# Patient Record
Sex: Male | Born: 1945
Health system: Southern US, Community
[De-identification: ages and names within clinical notes are randomized; demographics above are authoritative.]

## PROBLEM LIST (undated history)

## (undated) DIAGNOSIS — K219 Gastro-esophageal reflux disease without esophagitis: Secondary | ICD-10-CM

## (undated) DIAGNOSIS — Z86010 Personal history of colonic polyps: Secondary | ICD-10-CM

## (undated) DIAGNOSIS — C801 Malignant (primary) neoplasm, unspecified: Secondary | ICD-10-CM

## (undated) DIAGNOSIS — R600 Localized edema: Secondary | ICD-10-CM

## (undated) DIAGNOSIS — E291 Testicular hypofunction: Secondary | ICD-10-CM

## (undated) DIAGNOSIS — B958 Unspecified staphylococcus as the cause of diseases classified elsewhere: Secondary | ICD-10-CM

## (undated) DIAGNOSIS — G4730 Sleep apnea, unspecified: Secondary | ICD-10-CM

## (undated) DIAGNOSIS — E039 Hypothyroidism, unspecified: Secondary | ICD-10-CM

## (undated) DIAGNOSIS — T4145XA Adverse effect of unspecified anesthetic, initial encounter: Secondary | ICD-10-CM

## (undated) DIAGNOSIS — I10 Essential (primary) hypertension: Secondary | ICD-10-CM

## (undated) DIAGNOSIS — G20 Parkinson's disease: Secondary | ICD-10-CM

## (undated) DIAGNOSIS — M1990 Unspecified osteoarthritis, unspecified site: Secondary | ICD-10-CM

## (undated) DIAGNOSIS — M5030 Other cervical disc degeneration, unspecified cervical region: Secondary | ICD-10-CM

## (undated) DIAGNOSIS — E079 Disorder of thyroid, unspecified: Secondary | ICD-10-CM

## (undated) DIAGNOSIS — E785 Hyperlipidemia, unspecified: Secondary | ICD-10-CM

## (undated) DIAGNOSIS — H269 Unspecified cataract: Secondary | ICD-10-CM

## (undated) DIAGNOSIS — K5900 Constipation, unspecified: Secondary | ICD-10-CM

## (undated) DIAGNOSIS — G4733 Obstructive sleep apnea (adult) (pediatric): Secondary | ICD-10-CM

## (undated) DIAGNOSIS — Z9181 History of falling: Secondary | ICD-10-CM

## (undated) DIAGNOSIS — R7303 Prediabetes: Secondary | ICD-10-CM

## (undated) DIAGNOSIS — T7840XA Allergy, unspecified, initial encounter: Secondary | ICD-10-CM

## (undated) DIAGNOSIS — R1310 Dysphagia, unspecified: Secondary | ICD-10-CM

## (undated) DIAGNOSIS — T8859XA Other complications of anesthesia, initial encounter: Secondary | ICD-10-CM

## (undated) HISTORY — PX: TONSILLECTOMY: SUR1361

## (undated) HISTORY — PX: NASAL SEPTUM SURGERY: SHX37

## (undated) HISTORY — DX: Unspecified cataract: H26.9

## (undated) HISTORY — DX: Localized edema: R60.0

## (undated) HISTORY — DX: Allergy, unspecified, initial encounter: T78.40XA

## (undated) HISTORY — DX: Testicular hypofunction: E29.1

## (undated) HISTORY — DX: Parkinson's disease: G20

## (undated) HISTORY — DX: History of falling: Z91.81

## (undated) HISTORY — DX: Essential (primary) hypertension: I10

## (undated) HISTORY — DX: Disorder of thyroid, unspecified: E07.9

## (undated) HISTORY — PX: UVULOPALATOPHARYNGOPLASTY: SHX827

## (undated) HISTORY — DX: Hyperlipidemia, unspecified: E78.5

## (undated) HISTORY — DX: Prediabetes: R73.03

## (undated) HISTORY — DX: Obstructive sleep apnea (adult) (pediatric): G47.33

## (undated) HISTORY — PX: CATARACT EXTRACTION: SUR2

## (undated) HISTORY — DX: Unspecified staphylococcus as the cause of diseases classified elsewhere: B95.8

## (undated) HISTORY — DX: Sleep apnea, unspecified: G47.30

## (undated) HISTORY — DX: Personal history of colonic polyps: Z86.010

---

## 2004-04-27 HISTORY — PX: COLONOSCOPY: SHX174

## 2005-01-25 ENCOUNTER — Ambulatory Visit: Payer: Self-pay | Admitting: Internal Medicine

## 2005-05-23 ENCOUNTER — Ambulatory Visit: Payer: Self-pay | Admitting: Internal Medicine

## 2005-10-19 ENCOUNTER — Ambulatory Visit: Payer: Self-pay | Admitting: Internal Medicine

## 2005-10-22 ENCOUNTER — Ambulatory Visit: Payer: Self-pay | Admitting: Internal Medicine

## 2006-04-15 ENCOUNTER — Ambulatory Visit: Payer: Self-pay | Admitting: Internal Medicine

## 2006-06-05 ENCOUNTER — Ambulatory Visit: Payer: Self-pay | Admitting: Internal Medicine

## 2006-07-09 ENCOUNTER — Ambulatory Visit (HOSPITAL_BASED_OUTPATIENT_CLINIC_OR_DEPARTMENT_OTHER): Admission: RE | Admit: 2006-07-09 | Discharge: 2006-07-09 | Payer: Self-pay | Admitting: Internal Medicine

## 2006-07-14 ENCOUNTER — Ambulatory Visit: Payer: Self-pay | Admitting: Internal Medicine

## 2006-08-07 ENCOUNTER — Ambulatory Visit: Payer: Self-pay | Admitting: Internal Medicine

## 2006-09-17 ENCOUNTER — Ambulatory Visit: Payer: Self-pay | Admitting: Internal Medicine

## 2007-01-08 ENCOUNTER — Ambulatory Visit: Payer: Self-pay | Admitting: Internal Medicine

## 2007-06-11 ENCOUNTER — Ambulatory Visit: Payer: Self-pay | Admitting: Internal Medicine

## 2008-06-01 DIAGNOSIS — J302 Other seasonal allergic rhinitis: Secondary | ICD-10-CM | POA: Insufficient documentation

## 2008-06-01 DIAGNOSIS — I10 Essential (primary) hypertension: Secondary | ICD-10-CM

## 2008-06-01 DIAGNOSIS — H409 Unspecified glaucoma: Secondary | ICD-10-CM | POA: Insufficient documentation

## 2008-06-01 DIAGNOSIS — J3089 Other allergic rhinitis: Secondary | ICD-10-CM

## 2008-06-02 ENCOUNTER — Ambulatory Visit: Payer: Self-pay | Admitting: Internal Medicine

## 2008-06-06 DIAGNOSIS — G4700 Insomnia, unspecified: Secondary | ICD-10-CM

## 2008-12-02 ENCOUNTER — Ambulatory Visit (HOSPITAL_COMMUNITY): Admission: RE | Admit: 2008-12-02 | Discharge: 2008-12-02 | Payer: Self-pay | Admitting: Internal Medicine

## 2009-06-01 ENCOUNTER — Ambulatory Visit: Payer: Self-pay | Admitting: Internal Medicine

## 2010-05-31 ENCOUNTER — Ambulatory Visit: Payer: Self-pay | Admitting: Internal Medicine

## 2010-11-09 NOTE — Assessment & Plan Note (Signed)
Summary: 1 year/ mbw   Primary Provider/Referring Provider:  Oneta Rack  CC:  Yearly follow up visit-allergies; no complaints..  History of Present Illness: 06/02/08- 65 year old man returning for follow-up of rhinitis.  We had stopped allergy vaccine in September as planned.  He is controlling symptoms with fluticasone nasal spray and one half of an Allegra D12 hour tablet each morning. Getting spinal injections for degenerative arthritis of the spine. Complaints of daytime sleepiness, especially in the mornings.  A sleep study in October of 2007 was negative, index 0.6/hr, but limited.  He slept only two or 3 hours.  Sleep habits were reviewed.  No problem with sleep onset, but has difficulty maintaining sleep.  Lunesta half-life  was too long.  06/01/09- allergic rhiniitis Sleep problems resolved with no med needed. Rhinitis controlled well, taking fluticasone nasal spray each night. We discussed side effects of the decongestants, distinquished from antihistamines. He feels he is doing very well with current meds and feels no reason to make any changes. Discussed flu vaccine for this year.  May 31, 2010- Allergic rhinitis Managed over past year with otc fexofenadine-D. Had some BP problems with the decongestant. We talked in detail for 15 minutes about decongestants vs antihistimines. Some drainage. Ltitle cough and no wheeze or chest tightness. Currently using daughter's liquid claritin. Had MRI evaluating memory per neurology, put on aspirin.     Preventive Screening-Counseling & Management  Alcohol-Tobacco     Smoking Status: never  Current Medications (verified): 1)  Lumigan 0.01 % Soln (Bimatoprost) .Marland Kitchen.. 1 Drop in Each Eye 2)  Fluticasone Propionate 50 Mcg/act Susp (Fluticasone Propionate) .... Use 1-2 Sprays Daily 3)  Multivitamins  Tabs (Multiple Vitamin) .... Take 1 Tablet By Mouth Once A Day 4)  Bystolic 10 Mg Tabs (Nebivolol Hcl) .... Take 1 Tab By Mouth At Bedtime 5)   Pravastatin Sodium 20 Mg Tabs (Pravastatin Sodium) .... Take 1 Tablet On M,w,f 6)  Vitamin D (Ergocalciferol) 50000 Unit Caps (Ergocalciferol) .... Take One Capsule By Mouth Two Times Per Week 7)  Claritin 5 Mg Chew (Loratadine) .... Take 1 By Mouth At Bedtime 8)  Aspirin 81 Mg Tbec (Aspirin) .... Take 1 By Mouth Once Daily 9)  Veramyst 27.5 Mcg/spray Susp (Fluticasone Furoate) .Marland Kitchen.. 1 Spray At Bedtime 10)  Testosterone Injection .... 2cc Every 3 Weeks  Allergies (verified): No Known Drug Allergies  Past History:  Past Medical History: Last updated: 06/02/2008 HYPERTENSION (ICD-401.9) GLAUCOMA (ICD-365.9) ? OBSTRUCTIVE SLEEP APNEA (ICD-327.23) RULED OUT by NEG NPSG 10/07, limited study ALLERGIC RHINITIS (ICD-477.9)- stopped allergy vaccine 9/08 Degenerative arthritis spine  Past Surgical History: Last updated: 06/01/2009 Uvulopalatopharyngoplasty/ UPPP x 2 Lumbar disk Cataracts  Family History: Last updated: 06/02/2008 mother-emphysema father-liver [place]  Social History: Last updated: 06/02/2008 Patient never smoked.  Pt is married with 2 children. Pt is currently employeed as a Therapist, music.   Risk Factors: Smoking Status: never (05/31/2010)  Review of Systems      See HPI       The patient complains of nasal congestion/difficulty breathing through nose and sneezing.  The patient denies shortness of breath with activity, shortness of breath at rest, productive cough, non-productive cough, coughing up blood, chest pain, irregular heartbeats, acid heartburn, indigestion, loss of appetite, weight change, abdominal pain, difficulty swallowing, sore throat, tooth/dental problems, and headaches.    Vital Signs:  Patient profile:   65 year old male Height:      70 inches Weight:      203.25 pounds  BMI:     29.27 O2 Sat:      98 % on Room air Pulse rate:   61 / minute BP sitting:   134 / 78  (right arm) Cuff size:   regular  Vitals Entered By: Reynaldo Minium  CMA (May 31, 2010 9:06 AM)  O2 Flow:  Room air CC: Yearly follow up visit-allergies; no complaints.   Physical Exam  Additional Exam:  General: A/Ox3; pleasant and cooperative, NAD, SKIN: no rash, lesions NODES: no lymphadenopathy HEENT: [place]/AT, EOM- WNL, Conjuctivae- clear, PERRLA, TM-WNL, Nose- clear, Throat- clear and wnl, Mallampati  II s/p UPPP NECK: Supple w/ fair ROM, JVD- none, normal carotid impulses w/o bruits Thyroid- normal to palpation, no stridor CHEST: Clear to P&A, ? a little raspy? HEART: RRR, no m/g/r heard ABDOMEN: Soft and nl;  EAV:WUJW, nl pulses, no edema  NEURO: Grossly intact to observation, speech seems brisk, almost pressured.      Impression & Recommendations:  Problem # 1:  ALLERGIC RHINITIS (ICD-477.9)  Fair control. He expects more problems later in the Fall. We are going to have him try allegra 180, supplementing with an otc decongestant at times as needed. We will refill fluticasone. i think he shuld go lightly on the stimulant/ decongestants. The following medications were removed from the medication list:    Veramyst 27.5 Mcg/spray Susp (Fluticasone furoate) .Marland Kitchen... 1 spray at bedtime His updated medication list for this problem includes:    Claritin 5 Mg Chew (Loratadine) .Marland Kitchen... Take 1 by mouth at bedtime    Fluticasone Propionate 50 Mcg/act Susp (Fluticasone propionate) ..... Use 1-2 sprays daily    Fexofenadine Hcl 180 Mg Tabs (Fexofenadine hcl) .Marland Kitchen... 1 daily as needed antihistamine    Sudafed Pe Maximum Strength 10 Mg Tabs (Phenylephrine hcl) .Marland Kitchen... 1 three times a day as needed  Problem # 2:  INSOMNIA (ICD-780.52) Stimulant meds were discussed as aggravating insomnia.  Medications Added to Medication List This Visit: 1)  Lumigan 0.01 % Soln (Bimatoprost) .Marland Kitchen.. 1 drop in each eye 2)  Claritin 5 Mg Chew (Loratadine) .... Take 1 by mouth at bedtime 3)  Aspirin 81 Mg Tbec (Aspirin) .... Take 1 by mouth once daily 4)  Testosterone Injection   .... 2cc every 3 weeks 5)  Veramyst 27.5 Mcg/spray Susp (Fluticasone furoate) .Marland Kitchen.. 1 spray at bedtime 6)  Fexofenadine Hcl 180 Mg Tabs (Fexofenadine hcl) .Marland Kitchen.. 1 daily as needed antihistamine 7)  Sudafed Pe Maximum Strength 10 Mg Tabs (Phenylephrine hcl) .Marland Kitchen.. 1 three times a day as needed  Other Orders: Est. Patient Level IV (11914)  Patient Instructions: 1)  Please schedule a follow-up appointment in 1 year. 2)  Suggest you try otc fexofenadine/ Allegra 180/ 24 hour strength, once daily as an antihistamine. Compare with claritin. 3)  Try adding an otc decongestant only when you are stuffy  4)       Sudafed-PE 5)  OK to continue nasal steroid fluticasone Prescriptions: FLUTICASONE PROPIONATE 50 MCG/ACT SUSP (FLUTICASONE PROPIONATE) use 1-2 sprays daily  #3 x 3   Entered and Authorized by:   Waymon Budge MD   Signed by:   Waymon Budge MD on 05/31/2010   Method used:   Print then Give to Patient   RxID:   [id number]

## 2011-02-20 NOTE — Assessment & Plan Note (Signed)
[name]                             PULMONARY OFFICE NOTE   NAME:MILLSRanier, [name]                        MRN:          [id number]  DATE:06/11/2007                            DOB:          Aug 29, 1946    PROBLEM:  1. Allergic rhinitis.  2. Obstructive sleep apnea/uvulopalatopharyngoplasty.  3. Glaucoma.  4. Hypertension.   HISTORY:  One year followup.  He had had a nocturnal polysomnogram in  October of 2007 showing an index of only 0.6 per hour but his wife is in  a separate room still because of his loud snoring.  Dr. Haroldine Laws has  done a septoplasty and palatoplasty.  He still wakes briefly during the  night with the sense that his sleep quality is shallow, he does not  recognize any benefit from [place] taking 1/2 of an Allegra-D tablet in  the mornings.  He has been on allergy vaccine at 1:10 since skin testing  in 2003 and is ready to try being off.   MEDICATIONS:  1. Xalatan drops.  2. Fluticasone nasal spray.  3. Allegra D 12 hour pill.  4. Allergy vaccine.  5. Blood pressure pill he can not name.  6. Occasional Viagra.  7. He has an Epi-Pen.   NO MEDICATION ALLERGY.   OBJECTIVE:  Weight 206 pounds, blood pressure 138/76, pulse 78, room air  saturation 98%.  Alert, medium build.  Clear speech, nasal airway is  quite clear.  Palate is status post palatoplasty still with rather long  soft palate posteriorly placed.  There is no visible drainage.  LUNGS:  Fields are clear.  HEART:  Sounds normal.  EXTREMITIES:  Without tremor or restlessness.   IMPRESSION:  1. Excessive daytime somnolence and snoring with some sense that sleep      quality is non-restorative, but nocturnal polysomnogram in October      of 2007 recorded index only 0.6 per hour.  2. Allergic rhinitis.   PLAN:  1. Finish and quit allergy vaccine then watch symptoms.  2. May take 1/2 of an Allegra-D in the mornings if that helps him.  3. Retry Ambien CR 6.25 mg one  or two at bedtime p.r.n. and compare      that with samples      Lunesta 2 mg bedtime p.r.n.  4. Schedule return 1 year, earlier p.r.n.     Mason D. Maple Hudson, MD, [name], FACP  Electronically Signed    CDY/MedQ  DD: 06/11/2007  DT: 06/12/2007  Job #: [id number]   cc:   [name], M.D.

## 2011-02-23 NOTE — Assessment & Plan Note (Signed)
[facility]                               PULMONARY OFFICE NOTE   NAME:[name], [name]                        MRN:          [id number]  DATE:06/05/2006                            DOB:          07/06/1946    PROBLEM:  1. Allergic rhinitis.  2. Obstructive sleep apnea.   HISTORY:  He came today reporting that his allergy control has been good,  but he wants to talk about his snoring and sleep apnea.  As the ragweed and  pollen levels have risen, he is using Allegra-D 24 hour on a daily basis and  generic Flonase b.i.d.  He continues allergy vaccine at 1:10 given by his  wife with no problems.  We took time again today to discuss risks, benefits,  and choices related to allergy vaccine, including policy concerning  administration outside of a medical office, anaphylaxis, and epinephrine.  He has now been on allergy vaccine for four years and we are going to  reassess at the end of five.  His wife is sleeping upstairs to avoid his  snoring.  He is aware that he tosses and turns in his sleep and wakes  unrested.  In the past years, he had a palatoplasty and septoplasty by Dr.  Haroldine Laws to treat sleep apnea.  His original study is probably 64 years old  and we do not have a copy of the old report any longer.   MEDICATIONS:  1. Xalatan eye drops.  2. Fluticasone nasal spray.  3. Allegra-D 24.  4. Allergy injections.  5. Viagra.  6. Epi pen.   ALLERGIES:  No known drug allergies.   OBJECTIVE:  VITAL SIGNS:  Weight 203 pounds, BP 142/84, pulse regular at 74,  room air saturation 97%.  HEENT:  There is moderate turbinate edema and some clear white mucus  crusting without visible polyps or obstruction.  Conjunctivae are clear with  no injection or discharge.  His pharynx is not reddened.  He is status post  uvuloplasty.  Voice quality is normal.  NECK:  There is no stridor or neck vein distention.  LUNGS:  Clear to P&A.  HEART:  Heart sounds are  regular without murmur or gallop.  No edema.   IMPRESSION:  1. Allergic rhinitis under fair control, but requiring daily medication in      addition to allergy vaccine, at this, which he considers his worst time      of year.  2. Obstructive sleep apnea, no longer controlled by previous surgery.   PLAN:  1. Risk/benefits discussion and waiver signed.  2. We are scheduling a split protocol nocturnal polysomnogram at the [facility], and he will return after that is complete for followup.                                   Clinton D. Maple Hudson, MD, FCCP, FACP   CDY/MedQ  DD:  06/05/2006  DT:  06/05/2006  Job #:  [id number]  cc:   Lucky Cowboy, MD

## 2011-02-23 NOTE — Procedures (Signed)
NAME:  [name], [name]               ACCOUNT NO.:  [id number]   MEDICAL RECORD NO.:  [id number]          PATIENT TYPE:  OUT   LOCATION:  SLEEP CENTER                 FACILITY:  [facility]   PHYSICIAN:  Clinton D. Maple Hudson, MD, FCCP, FACPDATE OF BIRTH:  04/08/1947   DATE OF STUDY:  07/09/2006                              NOCTURNAL POLYSOMNOGRAM   REFERRING PHYSICIAN:  Dr. Jetty Duhamel   INDICATION FOR STUDY:  Hypersomnia with sleep apnea.  Previous history of  obstructive sleep apnea.   EPWORTH'S SLEEPINESS SCORE:  9/24.   BMI:  30.3.   WEIGHT:  205 pounds.   HOME MEDICATION:  Allegra-D 24-hour.   SLEEP ARCHITECTURE:  Total sleep time, short, 208 minutes with sleep  efficiency 57%.  Stage I was 6%.  Stage II 88%.  Stages III and IV were  absent.  REM 6% of total sleep time.  Sleep latency 29 minutes.  REM latency  112 minutes.  Awake after sleep onset 113 minutes.  Arousal index 11.  No  bedtime medication was taken.  The patient was described as getting very  restless in the latter part of the study and he was awake after 2 a.m.  The  study was ended at 4:02 a.m.   RESPIRATORY DATA:  Apnea hypopnea index (AHI, RDI), is 0.6 obstructing  events per hour, which is within normal limits.  This reflected 1  obstructive apnea and 1 hypopnea.  These events were not positional.  REM  AHI 0.   OXYGEN DATA:  Moderate snoring with oxygen desaturation to a nadir of 90%.  Mean oxygen saturation through the study was 95% on room air.   CARDIAC DATA:  Normal sinus rhythm.   MOVEMENTS/PARASOMNIA:  A total of 289 limb jerks were recorded of which 11  were associated with arousal or awakening for periodic limb movement with  arousal index of 3.2 per hour which is mildly increased.  No bathroom trips.   IMPRESSION/RECOMMENDATION:  1. Short total sleep time, awake after 2 a.m.  Note the possible stimulant      effect of Allegra-D 24.  2. Occasional sleep disordered breathing events, apnea hypopnea  index 0.6      per hour, which is within normal      limits and insignificant.  Moderate snoring with oxygen desaturation to      a nadir of 90%.  3. Periodic limb movement with arousal, 3.2 per hour.      Clinton D. Maple Hudson, MD, [facility], FACP  Diplomate, Biomedical engineer of Sleep Medicine  Electronically Signed     CDY/MEDQ  D:  07/13/2006 12:44:13  T:  07/15/2006 05:57:55  Job:  [id number]

## 2011-02-23 NOTE — Assessment & Plan Note (Signed)
[facility]                               PULMONARY OFFICE NOTE   NAME:MILLSJesson, Foskey                        MRN:          [id number]  DATE:08/07/2006                            DOB:          11/30/1945    PROBLEMS:  1. Allergic rhinitis.  2. Obstructive sleep apnea/palatoplasty.  3. Glaucoma.   HISTORY:  He had to wait again today, unfortunately, and I apologized, and  we discussed the office process.  He returns now after his sleep study done  October 2.  He was only able to fall to sleep a total of 208 minutes, and  was awake most of the time after 2 a.m.  He has been taking Allegra D,  sometimes Allegra D 24, and I discussed the stimulant effect and impact on  sleep of sustained-action decongestants.  He had only occasional sleep  disordered breathing events, with an index of 0.6 per hour, moderate snoring  and normal oxygenation.  He had a few leg jerks causing arousals about 3.2  times per hour.  He says he falls asleep well, but wakes in the middle of  the night, similar to his pattern at the sleep center, and cannot regain  sleep.  He drinks coffee and usually takes a 12-hour duration Allegra  product in the morning, with no caffeine after lunch.   MEDICATIONS:  1. Xalatan eye drops.  2. Fluticasone nasal spray.  3. Allegra D 24 or 12-hour.  4. Multivitamins.  5. Allergy vaccine continues at 1:10, given by his wife without problems.  6. Viagra.  7. EpiPen.   ALLERGIES:  No medication allergy.   OBJECTIVE:  Weight 215 pounds, BP 172/92, pulse regular 79, room air  saturation 97%.  Medium build, a little overweight.  Nasal airways not obstructed.  Speech quality is clear.  He does not appear  sleepy.  Breathing is unlabored, no restlessness or unusual movement.   IMPRESSION:  1. Allergic rhinitis.  2. Snoring without sleep apnea.  3. Chronic insomnia.  4. Hypertension, at least after waiting at this office, but again with  considerations related to use of long-term decongestant medications.   PLAN:  We discussed sleep hygiene and expectations, available medication  options.  1. Triazolam 0.25 mg for use on waking in the middle of the night.  He can      try taking it at bedtime as a first dose if necessary.  He can compare      this to Sonata 10 mg, in each case looking at these as short-acting      medications appropriate for use when necessary as long as he expects to      get about 4 hours more sleep.  2. Try Astelin nasal spray once or twice each nostril b.i.d. p.r.n.  It      may help him sleep at night.  3. Try sleeping off flat of back.  4. Schedule return in 1 year vaccine followup, earlier p.r.n.     Clinton D. Maple Hudson, MD, FCCP, FACP  Electronically Signed    CDY/MedQ  DD:  08/10/2006  DT: 08/11/2006  Job #: [id number]   cc:   Lucky Cowboy, M.D.

## 2011-05-29 ENCOUNTER — Encounter: Payer: Self-pay | Admitting: Internal Medicine

## 2011-05-30 ENCOUNTER — Ambulatory Visit (INDEPENDENT_AMBULATORY_CARE_PROVIDER_SITE_OTHER): Payer: BC Managed Care – PPO | Admitting: Internal Medicine

## 2011-05-30 ENCOUNTER — Encounter: Payer: Self-pay | Admitting: Internal Medicine

## 2011-05-30 VITALS — BP 112/86 | HR 64 | Ht 70.0 in | Wt 204.8 lb

## 2011-05-30 DIAGNOSIS — J309 Allergic rhinitis, unspecified: Secondary | ICD-10-CM

## 2011-05-30 NOTE — Assessment & Plan Note (Signed)
His glaucoma and HBP are pertinent to choices of meds.  A nonsedating antihistamine and the fluticasone are probably best choices for bothersome symptoms. We went over the antihistmaine issue again.

## 2011-05-30 NOTE — Patient Instructions (Signed)
Please call for refill on your fluticasone/ Flonase as needed  Consider for nonsedating antihistamine: loratadine/ Claritin or fexofenadine/ Allegra. These don't get into the brain, won't raise blood pressure and won't cause drowsiness for most people.

## 2011-05-30 NOTE — Progress Notes (Addendum)
Subjective:    Patient ID: [name], male    DOB: 12-Aug-1946, 65 y.o.   MRN: [id number]  HPI 05/30/11- 65 yoM followed for allergic rhinitis, complicated by insomnia, glaucoma, HBP. Last here May 31, 2010-  Expects most problems in the Fall Ragweed season. Pollen levels have not started to surge yet, but he reports some nasal congestion and eyes watering. Minimal chest tightness mainly with bending over.I discussed this as related more to abdominal girth than to lung function.  Med list review- he seems unaware of loratadine or allegra despite discussion last year, and avoids allergy and sinus meds. . He asks about Agent Orange, assuming he was exposed, but not knowing or making a specific attribution. I referred him to the [place] for that consideration. He indicates concern that he is slowing down and forgetful. Neurology evaluation by Dr Thad Ranger over a year ago was negative.   Review of Systems Constitutional:   No-   weight loss, night sweats, fevers, chills, fatigue, lassitude. HEENT:   No-  headaches, difficulty swallowing, tooth/dental problems, sore throat,       Mild-   sneezing, itching, ear ache, nasal congestion, post nasal drip,  CV:  No-   chest pain, orthopnea, PND, swelling in lower extremities, anasarca, dizziness, palpitations Resp: No-   shortness of breath with exertion or at rest.              No-   productive cough,  No non-productive cough,  No-  coughing up of blood.              No-   change in color of mucus.  No- wheezing.   Skin: No-   rash or lesions. GI:  No-   heartburn, indigestion, abdominal pain, nausea, vomiting, diarrhea,                 change in bowel habits, loss of appetite GU: No-   dysuria, change in color of urine, no urgency or frequency.  No- flank pain. MS:  No-   joint pain or swelling.  No- decreased range of motion.  No- back pain. Neuro- grossly normal to observation, Or:  Psych:  No- change in mood or affect. No depression or anxiety.  No  memory loss.      Objective:   Physical Exam General- Alert, Oriented, Affect-appropriate, Distress- none acute Skin- rash-none, lesions- none, excoriation- none Lymphadenopathy- none Head- atraumatic            Eyes- Gross vision intact, PERRLA, conjunctivae clear secretions            Ears- Hearing, canals normal            Nose- Clear, No- Septal dev, mucus, polyps, erosion, perforation             Throat- Mallampati II, mucosa clear , drainage- none, tonsils- atrophic Neck- flexible , trachea midline, no stridor , thyroid nl, carotid no bruit Chest - symmetrical excursion , unlabored           Heart/CV- RRR , no murmur , no gallop  , no rub, nl s1 s2                           - JVD- none , edema- none, stasis changes- none, varices- none           Lung- clear to P&A, wheeze- none, cough- none , dullness-none, rub- none  Chest wall-  Abd- tender-no, distended-no, bowel sounds-present, HSM- no Br/ Gen/ Rectal- Not done, not indicated Extrem- cyanosis- none, clubbing, none, atrophy- none, strength- nl Neuro- His facial expression seems flat. No tremor, but I wonder about early Parkinson's which might relate some of his complains. Discussed available Neurology since Dr Thad Ranger is now gone.         Assessment & Plan:

## 2011-09-11 ENCOUNTER — Other Ambulatory Visit (HOSPITAL_COMMUNITY): Payer: Self-pay | Admitting: Internal Medicine

## 2011-09-11 ENCOUNTER — Ambulatory Visit (HOSPITAL_COMMUNITY)
Admission: RE | Admit: 2011-09-11 | Discharge: 2011-09-11 | Disposition: A | Discharge status: Home / Self Care | Payer: BC Managed Care – PPO | Source: Ambulatory Visit | Attending: Internal Medicine | Admitting: Internal Medicine

## 2011-09-11 DIAGNOSIS — R0602 Shortness of breath: Secondary | ICD-10-CM | POA: Insufficient documentation

## 2011-09-11 DIAGNOSIS — R059 Cough, unspecified: Secondary | ICD-10-CM | POA: Insufficient documentation

## 2011-09-11 DIAGNOSIS — R0989 Other specified symptoms and signs involving the circulatory and respiratory systems: Secondary | ICD-10-CM | POA: Insufficient documentation

## 2011-09-11 DIAGNOSIS — R05 Cough: Secondary | ICD-10-CM

## 2011-11-21 ENCOUNTER — Ambulatory Visit (INDEPENDENT_AMBULATORY_CARE_PROVIDER_SITE_OTHER): Payer: BC Managed Care – PPO | Admitting: Physician Assistant

## 2011-11-21 DIAGNOSIS — R0602 Shortness of breath: Secondary | ICD-10-CM

## 2011-11-21 NOTE — Progress Notes (Signed)
   [name] is a 65 y.o. male with a h/o HTN, HLP who presents for ETT.  Referred by PCP.  Non-smoker.  He has a FHx of CAD (sister had CABG in 58s).  Recent h/o DOE.  No chest pain.  Exam unremarkable.  Exercise Treadmill Test  Pre-Exercise Testing Evaluation Rhythm: normal sinus  Rate: 73   PR:  .15 QRS:  .10  QT:  .36 QTc: .39     Test  Exercise Tolerance Test Ordering MD: Lucky Cowboy MD  Interpreting MD:  Tereso Newcomer PA-C  Unique Test No: 1  Treadmill:  1  Indication for ETT: exertional dyspnea  Contraindication to ETT: No   Stress Modality: exercise - treadmill  Cardiac Imaging Performed: non   Protocol: standard Bruce - maximal  Max BP:  185/60  Max MPHR (bpm):  156 85% MPR (bpm):  133  MPHR obtained (bpm):  141 % MPHR obtained:  90%  Reached 85% MPHR (min:sec):  7:28 Total Exercise Time (min-sec):  8:30  Workload in METS:  10.1 Borg Scale: 17  Reason ETT Terminated:  desired heart rate attained    ST Segment Analysis At Rest: normal ST segments - no evidence of significant ST depression With Exercise: non-specific ST changes  Other Information Arrhythmia:  No Angina during ETT:  absent (0) Quality of ETT:  diagnostic  ETT Interpretation:  normal - no evidence of ischemia by ST analysis  Comments: Good exercise tolerance. No chest pain. Normal BP response to exercise. No ST-T changes to suggest ischemia.   Recommendations: Follow up with PCP as directed. Tereso Newcomer, PA-C  9:41 AM 11/21/2011

## 2012-02-07 DIAGNOSIS — E782 Mixed hyperlipidemia: Secondary | ICD-10-CM | POA: Diagnosis not present

## 2012-02-07 DIAGNOSIS — M79609 Pain in unspecified limb: Secondary | ICD-10-CM | POA: Diagnosis not present

## 2012-02-07 DIAGNOSIS — R413 Other amnesia: Secondary | ICD-10-CM | POA: Diagnosis not present

## 2012-02-07 DIAGNOSIS — R4789 Other speech disturbances: Secondary | ICD-10-CM | POA: Diagnosis not present

## 2012-02-07 DIAGNOSIS — R7309 Other abnormal glucose: Secondary | ICD-10-CM | POA: Diagnosis not present

## 2012-02-07 DIAGNOSIS — Z79899 Other long term (current) drug therapy: Secondary | ICD-10-CM | POA: Diagnosis not present

## 2012-02-07 DIAGNOSIS — E559 Vitamin D deficiency, unspecified: Secondary | ICD-10-CM | POA: Diagnosis not present

## 2012-02-07 DIAGNOSIS — E538 Deficiency of other specified B group vitamins: Secondary | ICD-10-CM | POA: Diagnosis not present

## 2012-02-07 DIAGNOSIS — E291 Testicular hypofunction: Secondary | ICD-10-CM | POA: Diagnosis not present

## 2012-02-07 DIAGNOSIS — I10 Essential (primary) hypertension: Secondary | ICD-10-CM | POA: Diagnosis not present

## 2012-02-25 DIAGNOSIS — M5137 Other intervertebral disc degeneration, lumbosacral region: Secondary | ICD-10-CM | POA: Diagnosis not present

## 2012-02-25 DIAGNOSIS — IMO0002 Reserved for concepts with insufficient information to code with codable children: Secondary | ICD-10-CM | POA: Diagnosis not present

## 2012-02-25 DIAGNOSIS — M545 Low back pain, unspecified: Secondary | ICD-10-CM | POA: Diagnosis not present

## 2012-02-27 DIAGNOSIS — E291 Testicular hypofunction: Secondary | ICD-10-CM | POA: Diagnosis not present

## 2012-02-29 DIAGNOSIS — M545 Low back pain: Secondary | ICD-10-CM | POA: Diagnosis not present

## 2012-02-29 DIAGNOSIS — IMO0002 Reserved for concepts with insufficient information to code with codable children: Secondary | ICD-10-CM | POA: Diagnosis not present

## 2012-03-06 DIAGNOSIS — R413 Other amnesia: Secondary | ICD-10-CM | POA: Diagnosis not present

## 2012-03-06 DIAGNOSIS — R490 Dysphonia: Secondary | ICD-10-CM | POA: Diagnosis not present

## 2012-03-06 DIAGNOSIS — G259 Extrapyramidal and movement disorder, unspecified: Secondary | ICD-10-CM | POA: Diagnosis not present

## 2012-03-06 DIAGNOSIS — R6889 Other general symptoms and signs: Secondary | ICD-10-CM | POA: Diagnosis not present

## 2012-03-20 DIAGNOSIS — E291 Testicular hypofunction: Secondary | ICD-10-CM | POA: Diagnosis not present

## 2012-03-26 DIAGNOSIS — M545 Low back pain, unspecified: Secondary | ICD-10-CM | POA: Diagnosis not present

## 2012-04-07 LAB — HM COLONOSCOPY

## 2012-04-08 DIAGNOSIS — E291 Testicular hypofunction: Secondary | ICD-10-CM | POA: Diagnosis not present

## 2012-04-29 DIAGNOSIS — E291 Testicular hypofunction: Secondary | ICD-10-CM | POA: Diagnosis not present

## 2012-05-05 DIAGNOSIS — J0100 Acute maxillary sinusitis, unspecified: Secondary | ICD-10-CM | POA: Diagnosis not present

## 2012-05-07 DIAGNOSIS — H409 Unspecified glaucoma: Secondary | ICD-10-CM | POA: Diagnosis not present

## 2012-05-07 DIAGNOSIS — H4011X0 Primary open-angle glaucoma, stage unspecified: Secondary | ICD-10-CM | POA: Diagnosis not present

## 2012-05-19 DIAGNOSIS — E291 Testicular hypofunction: Secondary | ICD-10-CM | POA: Diagnosis not present

## 2012-06-10 DIAGNOSIS — E291 Testicular hypofunction: Secondary | ICD-10-CM | POA: Diagnosis not present

## 2012-06-11 ENCOUNTER — Ambulatory Visit (INDEPENDENT_AMBULATORY_CARE_PROVIDER_SITE_OTHER): Payer: Medicare Other | Admitting: Internal Medicine

## 2012-06-11 ENCOUNTER — Encounter: Payer: Self-pay | Admitting: Internal Medicine

## 2012-06-11 VITALS — BP 136/82 | HR 76 | Ht 70.0 in | Wt 208.4 lb

## 2012-06-11 DIAGNOSIS — J309 Allergic rhinitis, unspecified: Secondary | ICD-10-CM

## 2012-06-11 NOTE — Progress Notes (Signed)
Subjective:    Patient ID: [name], male    DOB: 11/21/1945, 66 y.o.   MRN: [id number]  HPI 05/30/11- 65 yoM followed for allergic rhinitis, complicated by insomnia, glaucoma, HBP. Last here May 31, 2010-  Expects most problems in the Fall Ragweed season. Pollen levels have not started to surge yet, but he reports some nasal congestion and eyes watering. Minimal chest tightness mainly with bending over.I discussed this as related more to abdominal girth than to lung function.  Med list review- he seems unaware of loratadine or allegra despite discussion last year, and avoids allergy and sinus meds. . He asks about Agent Orange, assuming he was exposed, but not knowing or making a specific attribution. I referred him to the [place] for that consideration. He indicates concern that he is slowing down and forgetful. Neurology evaluation by Dr Thad Ranger over a year ago was negative.   06/11/12-  65 yoM followed for allergic rhinitis, complicated by insomnia, glaucoma, HBP.  PCP Dr Oneta Rack  Patient states allergies are worse than last year. c/o recent onset of cough with white mucus, wheezing, runny nose, post nasal drip, and itchy eyes. He denies reflux symptoms. Taking one half Zyrtec tablet at bedtime. A whole tablet too strong and overdry skin. Diagnosed with early Parkinson's disease. Was recent blood donor.  Review of Systems- see HPI Constitutional:   No-   weight loss, night sweats, fevers, chills, fatigue, lassitude. HEENT:   No-  headaches, difficulty swallowing, tooth/dental problems, sore throat,       +  sneezing, itching, ear ache, nasal congestion, post nasal drip,  CV:  No-   chest pain, orthopnea, PND, swelling in lower extremities, anasarca, dizziness, palpitations Resp: No-   shortness of breath with exertion or at rest.              No-   productive cough,  No non-productive cough,  No-  coughing up of blood.              No-   change in color of mucus.  No- wheezing.   Skin: No-    rash or lesions. GI:  No-   heartburn, indigestion, abdominal pain, nausea, vomiting,  GU:  MS:  No-   joint pain or swelling.  . Neuro- some stiffness of movement:  Psych:  No- change in mood or affect. No depression or anxiety.  No memory loss.  Objective:   Physical Exam General- Alert, Oriented, Affect-appropriate, Distress- none acute Skin- rash-none, lesions- none, excoriation- none Lymphadenopathy- none Head- atraumatic            Eyes- Gross vision intact, PERRLA, conjunctivae clear secretions            Ears- Hearing, canals normal            Nose- Clear, No- Septal dev, mucus, polyps, erosion, perforation             Throat- +s/p UPPP, + tongue coated, +cobblestone pharynx , drainage- none, tonsils- atrophic Neck- flexible , trachea midline, no stridor , thyroid nl, carotid no bruit Chest - symmetrical excursion , unlabored           Heart/CV- RRR , no murmur , no gallop  , no rub, nl s1 s2                           - JVD- none , edema- none, stasis changes- none, varices- none  Lung- clear to P&A, wheeze- none, cough- none , dullness-none, rub- none           Chest wall-  Abd- tender-no, distended-no, bowel sounds-present, HSM- no Br/ Gen/ Rectal- Not done, not indicated Extrem- cyanosis- none, clubbing, none, atrophy- none, strength- nl Neuro- His facial expression seems flat. No tremor, but I wonder about early Parkinson's which might relate some of his complains. Discussed available Neurology since Dr Thad Ranger is now gone.   Assessment & Plan:

## 2012-06-11 NOTE — Patient Instructions (Addendum)
Sample Dymista nasal spray  1-2 puffs each nostril once daily at bedtime   Try this instead of flonase nasal spray   Script for diflucan pills- one daily x 2 days- for yeast in mouth  Please call as needed

## 2012-06-12 ENCOUNTER — Telehealth: Payer: Self-pay | Admitting: Internal Medicine

## 2012-06-12 MED ORDER — FLUCONAZOLE 150 MG PO TABS: 150.0000 mg | ORAL_TABLET | Freq: Every day | ORAL | Status: AC

## 2012-06-12 NOTE — Telephone Encounter (Signed)
Pt states rx was supposed to be sent in for diflucan for yeast but pharmacy did not receive this. Pt instruct is as follows: Script for diflucan pills- one daily x 2 days- for yeast in mouth    Please advise if the dose should be 100mg  or 150mg ? Carron Curie, CMA

## 2012-06-12 NOTE — Telephone Encounter (Signed)
rx sent. Pt is aware. Jennifer Castillo, CMA  

## 2012-06-12 NOTE — Telephone Encounter (Signed)
Per CY-give 150 mg tablets.

## 2012-06-20 NOTE — Assessment & Plan Note (Signed)
Seasonal exacerbation. Coated tongue made me ask if he was AIDS risk, which he denied, pointing out he was recently accepted as a blood donor. This is probably from more reflux than he realizes. Plan-Diflucan x2 tablets for possible thrush. Reflux precautions. Sample of Dymista.

## 2012-06-30 DIAGNOSIS — I10 Essential (primary) hypertension: Secondary | ICD-10-CM | POA: Diagnosis not present

## 2012-06-30 DIAGNOSIS — E559 Vitamin D deficiency, unspecified: Secondary | ICD-10-CM | POA: Diagnosis not present

## 2012-06-30 DIAGNOSIS — Z1159 Encounter for screening for other viral diseases: Secondary | ICD-10-CM | POA: Diagnosis not present

## 2012-06-30 DIAGNOSIS — E786 Lipoprotein deficiency: Secondary | ICD-10-CM | POA: Diagnosis not present

## 2012-06-30 DIAGNOSIS — Z79899 Other long term (current) drug therapy: Secondary | ICD-10-CM | POA: Diagnosis not present

## 2012-06-30 DIAGNOSIS — E782 Mixed hyperlipidemia: Secondary | ICD-10-CM | POA: Diagnosis not present

## 2012-06-30 DIAGNOSIS — E291 Testicular hypofunction: Secondary | ICD-10-CM | POA: Diagnosis not present

## 2012-06-30 DIAGNOSIS — R7309 Other abnormal glucose: Secondary | ICD-10-CM | POA: Diagnosis not present

## 2012-06-30 DIAGNOSIS — N529 Male erectile dysfunction, unspecified: Secondary | ICD-10-CM | POA: Diagnosis not present

## 2012-06-30 DIAGNOSIS — R5381 Other malaise: Secondary | ICD-10-CM | POA: Diagnosis not present

## 2012-07-16 DIAGNOSIS — Z85828 Personal history of other malignant neoplasm of skin: Secondary | ICD-10-CM | POA: Diagnosis not present

## 2012-07-16 DIAGNOSIS — L738 Other specified follicular disorders: Secondary | ICD-10-CM | POA: Diagnosis not present

## 2012-07-16 DIAGNOSIS — L821 Other seborrheic keratosis: Secondary | ICD-10-CM | POA: Diagnosis not present

## 2012-07-16 DIAGNOSIS — D239 Other benign neoplasm of skin, unspecified: Secondary | ICD-10-CM | POA: Diagnosis not present

## 2012-07-16 DIAGNOSIS — D2360 Other benign neoplasm of skin of unspecified upper limb, including shoulder: Secondary | ICD-10-CM | POA: Diagnosis not present

## 2012-07-16 DIAGNOSIS — L819 Disorder of pigmentation, unspecified: Secondary | ICD-10-CM | POA: Diagnosis not present

## 2012-07-21 DIAGNOSIS — Z23 Encounter for immunization: Secondary | ICD-10-CM | POA: Diagnosis not present

## 2012-07-21 DIAGNOSIS — E039 Hypothyroidism, unspecified: Secondary | ICD-10-CM | POA: Diagnosis not present

## 2012-07-21 DIAGNOSIS — E291 Testicular hypofunction: Secondary | ICD-10-CM | POA: Diagnosis not present

## 2012-08-05 DIAGNOSIS — H1045 Other chronic allergic conjunctivitis: Secondary | ICD-10-CM | POA: Diagnosis not present

## 2012-08-05 DIAGNOSIS — H409 Unspecified glaucoma: Secondary | ICD-10-CM | POA: Diagnosis not present

## 2012-08-05 DIAGNOSIS — H4011X0 Primary open-angle glaucoma, stage unspecified: Secondary | ICD-10-CM | POA: Diagnosis not present

## 2012-08-11 DIAGNOSIS — E291 Testicular hypofunction: Secondary | ICD-10-CM | POA: Diagnosis not present

## 2012-08-22 DIAGNOSIS — Z79899 Other long term (current) drug therapy: Secondary | ICD-10-CM | POA: Diagnosis not present

## 2012-08-22 DIAGNOSIS — N529 Male erectile dysfunction, unspecified: Secondary | ICD-10-CM | POA: Diagnosis not present

## 2012-08-22 DIAGNOSIS — Z111 Encounter for screening for respiratory tuberculosis: Secondary | ICD-10-CM | POA: Diagnosis not present

## 2012-08-22 DIAGNOSIS — I10 Essential (primary) hypertension: Secondary | ICD-10-CM | POA: Diagnosis not present

## 2012-08-22 DIAGNOSIS — Z125 Encounter for screening for malignant neoplasm of prostate: Secondary | ICD-10-CM | POA: Diagnosis not present

## 2012-08-22 DIAGNOSIS — R7309 Other abnormal glucose: Secondary | ICD-10-CM | POA: Diagnosis not present

## 2012-08-22 DIAGNOSIS — G7000 Myasthenia gravis without (acute) exacerbation: Secondary | ICD-10-CM | POA: Diagnosis not present

## 2012-08-22 DIAGNOSIS — E559 Vitamin D deficiency, unspecified: Secondary | ICD-10-CM | POA: Diagnosis not present

## 2012-08-22 DIAGNOSIS — E782 Mixed hyperlipidemia: Secondary | ICD-10-CM | POA: Diagnosis not present

## 2012-08-22 DIAGNOSIS — Z1212 Encounter for screening for malignant neoplasm of rectum: Secondary | ICD-10-CM | POA: Diagnosis not present

## 2012-08-27 DIAGNOSIS — F482 Pseudobulbar affect: Secondary | ICD-10-CM | POA: Diagnosis not present

## 2012-08-27 DIAGNOSIS — G259 Extrapyramidal and movement disorder, unspecified: Secondary | ICD-10-CM | POA: Diagnosis not present

## 2012-08-27 DIAGNOSIS — R269 Unspecified abnormalities of gait and mobility: Secondary | ICD-10-CM | POA: Diagnosis not present

## 2012-08-27 DIAGNOSIS — R490 Dysphonia: Secondary | ICD-10-CM | POA: Diagnosis not present

## 2012-08-28 DIAGNOSIS — M5137 Other intervertebral disc degeneration, lumbosacral region: Secondary | ICD-10-CM | POA: Diagnosis not present

## 2012-08-28 DIAGNOSIS — IMO0002 Reserved for concepts with insufficient information to code with codable children: Secondary | ICD-10-CM | POA: Diagnosis not present

## 2012-08-28 DIAGNOSIS — M545 Low back pain, unspecified: Secondary | ICD-10-CM | POA: Diagnosis not present

## 2012-09-01 DIAGNOSIS — E291 Testicular hypofunction: Secondary | ICD-10-CM | POA: Diagnosis not present

## 2012-09-08 ENCOUNTER — Ambulatory Visit: Payer: Medicare Other | Attending: Neurology | Admitting: Rehabilitative and Restorative Service Providers"

## 2012-09-08 DIAGNOSIS — R1310 Dysphagia, unspecified: Secondary | ICD-10-CM | POA: Diagnosis not present

## 2012-09-08 DIAGNOSIS — G20 Parkinson's disease: Secondary | ICD-10-CM | POA: Diagnosis not present

## 2012-09-08 DIAGNOSIS — R279 Unspecified lack of coordination: Secondary | ICD-10-CM | POA: Insufficient documentation

## 2012-09-08 DIAGNOSIS — R471 Dysarthria and anarthria: Secondary | ICD-10-CM | POA: Diagnosis not present

## 2012-09-08 DIAGNOSIS — R4189 Other symptoms and signs involving cognitive functions and awareness: Secondary | ICD-10-CM | POA: Diagnosis not present

## 2012-09-08 DIAGNOSIS — Z5189 Encounter for other specified aftercare: Secondary | ICD-10-CM | POA: Diagnosis not present

## 2012-09-08 DIAGNOSIS — R269 Unspecified abnormalities of gait and mobility: Secondary | ICD-10-CM | POA: Insufficient documentation

## 2012-09-08 DIAGNOSIS — M2420 Disorder of ligament, unspecified site: Secondary | ICD-10-CM | POA: Insufficient documentation

## 2012-09-08 DIAGNOSIS — M629 Disorder of muscle, unspecified: Secondary | ICD-10-CM | POA: Diagnosis not present

## 2012-09-08 DIAGNOSIS — G20A1 Parkinson's disease without dyskinesia, without mention of fluctuations: Secondary | ICD-10-CM | POA: Insufficient documentation

## 2012-09-16 ENCOUNTER — Ambulatory Visit: Payer: Medicare Other | Admitting: Physical Therapy

## 2012-09-18 ENCOUNTER — Ambulatory Visit: Payer: Medicare Other | Admitting: Rehabilitative and Restorative Service Providers"

## 2012-09-22 DIAGNOSIS — E291 Testicular hypofunction: Secondary | ICD-10-CM | POA: Diagnosis not present

## 2012-09-23 ENCOUNTER — Ambulatory Visit: Payer: Medicare Other | Admitting: Rehabilitative and Restorative Service Providers"

## 2012-09-23 ENCOUNTER — Ambulatory Visit: Payer: Medicare Other

## 2012-09-24 ENCOUNTER — Ambulatory Visit: Payer: Medicare Other | Admitting: Physical Therapy

## 2012-09-25 ENCOUNTER — Ambulatory Visit: Payer: Medicare Other | Admitting: Physical Therapy

## 2012-10-02 ENCOUNTER — Ambulatory Visit: Payer: Medicare Other | Admitting: Physical Therapy

## 2012-10-03 ENCOUNTER — Ambulatory Visit: Payer: Medicare Other | Admitting: Rehabilitative and Restorative Service Providers"

## 2012-10-07 ENCOUNTER — Ambulatory Visit: Payer: Medicare Other | Admitting: Occupational Therapy

## 2012-10-07 ENCOUNTER — Ambulatory Visit: Payer: Medicare Other | Admitting: Rehabilitative and Restorative Service Providers"

## 2012-10-09 ENCOUNTER — Ambulatory Visit: Payer: Medicare Other | Attending: Neurology | Admitting: Rehabilitative and Restorative Service Providers"

## 2012-10-09 DIAGNOSIS — G20 Parkinson's disease: Secondary | ICD-10-CM | POA: Diagnosis not present

## 2012-10-09 DIAGNOSIS — R269 Unspecified abnormalities of gait and mobility: Secondary | ICD-10-CM | POA: Diagnosis not present

## 2012-10-09 DIAGNOSIS — R1310 Dysphagia, unspecified: Secondary | ICD-10-CM | POA: Diagnosis not present

## 2012-10-09 DIAGNOSIS — M2420 Disorder of ligament, unspecified site: Secondary | ICD-10-CM | POA: Insufficient documentation

## 2012-10-09 DIAGNOSIS — R4189 Other symptoms and signs involving cognitive functions and awareness: Secondary | ICD-10-CM | POA: Insufficient documentation

## 2012-10-09 DIAGNOSIS — Z5189 Encounter for other specified aftercare: Secondary | ICD-10-CM | POA: Insufficient documentation

## 2012-10-09 DIAGNOSIS — G20A1 Parkinson's disease without dyskinesia, without mention of fluctuations: Secondary | ICD-10-CM | POA: Insufficient documentation

## 2012-10-09 DIAGNOSIS — R279 Unspecified lack of coordination: Secondary | ICD-10-CM | POA: Insufficient documentation

## 2012-10-09 DIAGNOSIS — R471 Dysarthria and anarthria: Secondary | ICD-10-CM | POA: Insufficient documentation

## 2012-10-09 DIAGNOSIS — M629 Disorder of muscle, unspecified: Secondary | ICD-10-CM | POA: Insufficient documentation

## 2012-10-13 DIAGNOSIS — E291 Testicular hypofunction: Secondary | ICD-10-CM | POA: Diagnosis not present

## 2012-10-14 ENCOUNTER — Ambulatory Visit: Payer: Medicare Other | Admitting: Occupational Therapy

## 2012-10-14 DIAGNOSIS — G20 Parkinson's disease: Secondary | ICD-10-CM | POA: Diagnosis not present

## 2012-10-14 DIAGNOSIS — Z5189 Encounter for other specified aftercare: Secondary | ICD-10-CM | POA: Diagnosis not present

## 2012-10-14 DIAGNOSIS — R279 Unspecified lack of coordination: Secondary | ICD-10-CM | POA: Diagnosis not present

## 2012-10-14 DIAGNOSIS — R4189 Other symptoms and signs involving cognitive functions and awareness: Secondary | ICD-10-CM | POA: Diagnosis not present

## 2012-10-14 DIAGNOSIS — R269 Unspecified abnormalities of gait and mobility: Secondary | ICD-10-CM | POA: Diagnosis not present

## 2012-10-14 DIAGNOSIS — M629 Disorder of muscle, unspecified: Secondary | ICD-10-CM | POA: Diagnosis not present

## 2012-10-15 ENCOUNTER — Ambulatory Visit: Payer: Medicare Other

## 2012-10-15 DIAGNOSIS — M629 Disorder of muscle, unspecified: Secondary | ICD-10-CM | POA: Diagnosis not present

## 2012-10-15 DIAGNOSIS — G20 Parkinson's disease: Secondary | ICD-10-CM | POA: Diagnosis not present

## 2012-10-15 DIAGNOSIS — R4189 Other symptoms and signs involving cognitive functions and awareness: Secondary | ICD-10-CM | POA: Diagnosis not present

## 2012-10-15 DIAGNOSIS — Z5189 Encounter for other specified aftercare: Secondary | ICD-10-CM | POA: Diagnosis not present

## 2012-10-15 DIAGNOSIS — R279 Unspecified lack of coordination: Secondary | ICD-10-CM | POA: Diagnosis not present

## 2012-10-15 DIAGNOSIS — R269 Unspecified abnormalities of gait and mobility: Secondary | ICD-10-CM | POA: Diagnosis not present

## 2012-10-16 ENCOUNTER — Ambulatory Visit: Payer: Medicare Other

## 2012-10-16 ENCOUNTER — Ambulatory Visit: Payer: Medicare Other | Admitting: Occupational Therapy

## 2012-10-16 DIAGNOSIS — R4189 Other symptoms and signs involving cognitive functions and awareness: Secondary | ICD-10-CM | POA: Diagnosis not present

## 2012-10-16 DIAGNOSIS — R279 Unspecified lack of coordination: Secondary | ICD-10-CM | POA: Diagnosis not present

## 2012-10-16 DIAGNOSIS — G20 Parkinson's disease: Secondary | ICD-10-CM | POA: Diagnosis not present

## 2012-10-16 DIAGNOSIS — Z5189 Encounter for other specified aftercare: Secondary | ICD-10-CM | POA: Diagnosis not present

## 2012-10-16 DIAGNOSIS — R269 Unspecified abnormalities of gait and mobility: Secondary | ICD-10-CM | POA: Diagnosis not present

## 2012-10-16 DIAGNOSIS — M629 Disorder of muscle, unspecified: Secondary | ICD-10-CM | POA: Diagnosis not present

## 2012-10-20 DIAGNOSIS — M545 Low back pain, unspecified: Secondary | ICD-10-CM | POA: Diagnosis not present

## 2012-10-21 ENCOUNTER — Ambulatory Visit: Payer: Medicare Other

## 2012-10-21 ENCOUNTER — Ambulatory Visit: Payer: Medicare Other | Admitting: Occupational Therapy

## 2012-10-21 DIAGNOSIS — R269 Unspecified abnormalities of gait and mobility: Secondary | ICD-10-CM | POA: Diagnosis not present

## 2012-10-21 DIAGNOSIS — M629 Disorder of muscle, unspecified: Secondary | ICD-10-CM | POA: Diagnosis not present

## 2012-10-21 DIAGNOSIS — Z5189 Encounter for other specified aftercare: Secondary | ICD-10-CM | POA: Diagnosis not present

## 2012-10-21 DIAGNOSIS — R279 Unspecified lack of coordination: Secondary | ICD-10-CM | POA: Diagnosis not present

## 2012-10-21 DIAGNOSIS — G20 Parkinson's disease: Secondary | ICD-10-CM | POA: Diagnosis not present

## 2012-10-21 DIAGNOSIS — R4189 Other symptoms and signs involving cognitive functions and awareness: Secondary | ICD-10-CM | POA: Diagnosis not present

## 2012-10-23 ENCOUNTER — Ambulatory Visit: Payer: Medicare Other | Admitting: Occupational Therapy

## 2012-10-23 ENCOUNTER — Ambulatory Visit: Payer: Medicare Other

## 2012-10-23 DIAGNOSIS — G20 Parkinson's disease: Secondary | ICD-10-CM | POA: Diagnosis not present

## 2012-10-23 DIAGNOSIS — M629 Disorder of muscle, unspecified: Secondary | ICD-10-CM | POA: Diagnosis not present

## 2012-10-23 DIAGNOSIS — R4189 Other symptoms and signs involving cognitive functions and awareness: Secondary | ICD-10-CM | POA: Diagnosis not present

## 2012-10-23 DIAGNOSIS — R269 Unspecified abnormalities of gait and mobility: Secondary | ICD-10-CM | POA: Diagnosis not present

## 2012-10-23 DIAGNOSIS — Z5189 Encounter for other specified aftercare: Secondary | ICD-10-CM | POA: Diagnosis not present

## 2012-10-23 DIAGNOSIS — R279 Unspecified lack of coordination: Secondary | ICD-10-CM | POA: Diagnosis not present

## 2012-10-24 DIAGNOSIS — R269 Unspecified abnormalities of gait and mobility: Secondary | ICD-10-CM | POA: Diagnosis not present

## 2012-10-24 DIAGNOSIS — R471 Dysarthria and anarthria: Secondary | ICD-10-CM | POA: Diagnosis not present

## 2012-10-24 DIAGNOSIS — G20 Parkinson's disease: Secondary | ICD-10-CM | POA: Diagnosis not present

## 2012-10-27 ENCOUNTER — Ambulatory Visit: Payer: Medicare Other

## 2012-10-27 ENCOUNTER — Ambulatory Visit: Payer: Medicare Other | Admitting: Occupational Therapy

## 2012-10-27 DIAGNOSIS — R4189 Other symptoms and signs involving cognitive functions and awareness: Secondary | ICD-10-CM | POA: Diagnosis not present

## 2012-10-27 DIAGNOSIS — G20 Parkinson's disease: Secondary | ICD-10-CM | POA: Diagnosis not present

## 2012-10-27 DIAGNOSIS — R269 Unspecified abnormalities of gait and mobility: Secondary | ICD-10-CM | POA: Diagnosis not present

## 2012-10-27 DIAGNOSIS — M629 Disorder of muscle, unspecified: Secondary | ICD-10-CM | POA: Diagnosis not present

## 2012-10-27 DIAGNOSIS — Z5189 Encounter for other specified aftercare: Secondary | ICD-10-CM | POA: Diagnosis not present

## 2012-10-27 DIAGNOSIS — R279 Unspecified lack of coordination: Secondary | ICD-10-CM | POA: Diagnosis not present

## 2012-10-29 ENCOUNTER — Ambulatory Visit: Payer: Medicare Other | Admitting: Occupational Therapy

## 2012-10-29 ENCOUNTER — Ambulatory Visit (HOSPITAL_COMMUNITY)
Admission: RE | Admit: 2012-10-29 | Discharge: 2012-10-29 | Disposition: A | Discharge status: Home / Self Care | Payer: Medicare Other | Source: Ambulatory Visit | Attending: Internal Medicine | Admitting: Internal Medicine

## 2012-10-29 ENCOUNTER — Ambulatory Visit: Payer: Medicare Other

## 2012-10-29 DIAGNOSIS — G4733 Obstructive sleep apnea (adult) (pediatric): Secondary | ICD-10-CM | POA: Insufficient documentation

## 2012-10-29 DIAGNOSIS — G20 Parkinson's disease: Secondary | ICD-10-CM

## 2012-10-29 DIAGNOSIS — Z5189 Encounter for other specified aftercare: Secondary | ICD-10-CM | POA: Diagnosis not present

## 2012-10-29 DIAGNOSIS — G20A1 Parkinson's disease without dyskinesia, without mention of fluctuations: Secondary | ICD-10-CM

## 2012-10-29 DIAGNOSIS — R1313 Dysphagia, pharyngeal phase: Secondary | ICD-10-CM | POA: Insufficient documentation

## 2012-10-29 DIAGNOSIS — I10 Essential (primary) hypertension: Secondary | ICD-10-CM | POA: Diagnosis not present

## 2012-10-29 DIAGNOSIS — M629 Disorder of muscle, unspecified: Secondary | ICD-10-CM | POA: Diagnosis not present

## 2012-10-29 DIAGNOSIS — R1310 Dysphagia, unspecified: Secondary | ICD-10-CM

## 2012-10-29 DIAGNOSIS — R279 Unspecified lack of coordination: Secondary | ICD-10-CM | POA: Diagnosis not present

## 2012-10-29 DIAGNOSIS — R269 Unspecified abnormalities of gait and mobility: Secondary | ICD-10-CM | POA: Diagnosis not present

## 2012-10-29 DIAGNOSIS — R4189 Other symptoms and signs involving cognitive functions and awareness: Secondary | ICD-10-CM | POA: Diagnosis not present

## 2012-10-29 NOTE — Procedures (Signed)
Objective Swallowing Evaluation: Modified Barium Swallowing Study  Patient Details  Name: [name] MRN: [id number] Date of Birth: 1945-10-24  Today's Date: 10/29/2012 Time: 1200-1230 SLP Time Calculation (min): 30 min  Past Medical History:  Past Medical History  Diagnosis Date  . HTN (hypertension)   . Glaucoma   . OSA (obstructive sleep apnea)   . Allergic rhinitis    Past Surgical History:  Past Surgical History  Procedure Date  . Uvulopalatopharyngoplasty   . Lumbar disc surgery   . Cataract extraction    HPI:  67 y.o. male presents for OPMBS secondary to increased frequency of dysphagia symptoms during mealtime.  [name] was diagnosed with  Parkinsonism <12 months ago.  Presents with gait changes, masked facies, rapid rate of speech.  Has been participating in OP speech therapy with good results per pt.  Pt specifies increased difficulty eating solids (bread) and coughing with liquids.     Assessment / Plan / Recommendation Clinical Impression  Dysphagia Diagnosis: Mild pharyngeal phase dysphagia Clinical impression: Pt presents with largely functional oropharyngeal swallow.  Oral phase is WNL with effective oral valving and bolus cohesion/control.  Pharyngeal phase is notable for mild deficits in hyolaryngeal elevation/timing, leading to inconsistent high penetration of thin liquids.  Over time, thin liquids migrated inferiorly through vocal folds and were visualized in the anterior and posterior trachea (trace amounts).  No cough response elicited with trace aspiration.  13 mm barium pill lodged momentarily in valleculae, then passed readily through UES and through esophagus.    Recommend initiating therapeutic exercise for hyolaryngeal strengthening and coordination; basic precautions to minimize aspiration (small boluses, slow rate, avoid swallowing liquid and solid consistencies simultaneously.)  Reviewed results with pt, who viewed video in real time; did not discuss  precautions.  Therapeutic exercise will likely facilitate improved pharyngeal function as well as diminish deterioration of swallow.     Treatment Recommendation  Defer treatment plan to SLP at (Comment)    Diet Recommendation Regular;Thin liquid   Liquid Administration via: Cup Medication Administration: Crushed with puree (or break into smaller pieces) Supervision: Patient able to self feed Compensations: Slow rate;Small sips/bites;Clear throat intermittently Postural Changes and/or Swallow Maneuvers: Seated upright 90 degrees    Other  Recommendations Oral Care Recommendations: Oral care BID   Follow Up Recommendations  Outpatient SLP    Frequency and Duration        Pertinent Vitals/Pain No pain    SLP Swallow Goals     General HPI: 67 y.o. male presents for OPMBS secondary to increased frequency of dysphagia symptoms during mealtime.  [name] was diagnosed with  Parkinsonism <12 months ago.  Presents with gait changes, masked facies, rapid rate of speech.  Has been participating in OP speech therapy with good results per pt.  Pt specifies increased difficulty eating solids (bread) and coughing with liquids. Type of Study: Modified Barium Swallowing Study Reason for Referral: Objectively evaluate swallowing function Diet Prior to this Study: Regular;Thin liquids Temperature Spikes Noted: No Respiratory Status: Room air Behavior/Cognition: Alert;Cooperative Oral Motor / Sensory Function: Impaired motor (decreased bilateral closing eyelids) Self-Feeding Abilities: Able to feed self Patient Positioning: Upright in chair Baseline Vocal Quality: Clear Volitional Cough: Strong Volitional Swallow: Able to elicit Anatomy: Within functional limits Pharyngeal Secretions: Not observed secondary MBS    Reason for Referral Objectively evaluate swallowing function   Oral Phase Oral Preparation/Oral Phase Oral Phase: WFL   Pharyngeal Phase Pharyngeal Phase Pharyngeal Phase:  Impaired Pharyngeal - Thin Pharyngeal -  Thin Cup: Reduced anterior laryngeal mobility;Reduced laryngeal elevation;Penetration/Aspiration during swallow;Penetration/Aspiration after swallow;Trace aspiration Pharyngeal - Solids Pharyngeal - Pill: Reduced anterior laryngeal mobility;Reduced laryngeal elevation  Cervical Esophageal Phase    GO    Cervical Esophageal Phase Cervical Esophageal Phase: [facility]    Functional Assessment Tool Used: clinical judgement Functional Limitations: Swallowing Swallow Current Status (Z6109): At least 20 percent but less than 40 percent impaired, limited or restricted Swallow Goal Status [phone]): At least 20 percent but less than 40 percent impaired, limited or restricted Swallow Discharge Status [phone]): At least 20 percent but less than 40 percent impaired, limited or restricted   [name] L. [name], [place] CCC/SLP Pager [phone] [name] [name] 10/29/2012, 12:57 PM

## 2012-11-03 ENCOUNTER — Ambulatory Visit: Payer: Medicare Other

## 2012-11-03 ENCOUNTER — Ambulatory Visit: Payer: Medicare Other | Admitting: Occupational Therapy

## 2012-11-03 DIAGNOSIS — G20 Parkinson's disease: Secondary | ICD-10-CM | POA: Diagnosis not present

## 2012-11-03 DIAGNOSIS — R269 Unspecified abnormalities of gait and mobility: Secondary | ICD-10-CM | POA: Diagnosis not present

## 2012-11-03 DIAGNOSIS — Z5189 Encounter for other specified aftercare: Secondary | ICD-10-CM | POA: Diagnosis not present

## 2012-11-03 DIAGNOSIS — R4189 Other symptoms and signs involving cognitive functions and awareness: Secondary | ICD-10-CM | POA: Diagnosis not present

## 2012-11-03 DIAGNOSIS — M629 Disorder of muscle, unspecified: Secondary | ICD-10-CM | POA: Diagnosis not present

## 2012-11-03 DIAGNOSIS — R279 Unspecified lack of coordination: Secondary | ICD-10-CM | POA: Diagnosis not present

## 2012-11-04 ENCOUNTER — Encounter: Payer: Medicare Other | Admitting: Occupational Therapy

## 2012-11-05 ENCOUNTER — Ambulatory Visit: Payer: Medicare Other | Admitting: Occupational Therapy

## 2012-11-05 ENCOUNTER — Ambulatory Visit: Payer: Medicare Other

## 2012-11-05 DIAGNOSIS — M629 Disorder of muscle, unspecified: Secondary | ICD-10-CM | POA: Diagnosis not present

## 2012-11-05 DIAGNOSIS — Z5189 Encounter for other specified aftercare: Secondary | ICD-10-CM | POA: Diagnosis not present

## 2012-11-05 DIAGNOSIS — R269 Unspecified abnormalities of gait and mobility: Secondary | ICD-10-CM | POA: Diagnosis not present

## 2012-11-05 DIAGNOSIS — R4189 Other symptoms and signs involving cognitive functions and awareness: Secondary | ICD-10-CM | POA: Diagnosis not present

## 2012-11-05 DIAGNOSIS — R279 Unspecified lack of coordination: Secondary | ICD-10-CM | POA: Diagnosis not present

## 2012-11-05 DIAGNOSIS — G20 Parkinson's disease: Secondary | ICD-10-CM | POA: Diagnosis not present

## 2012-11-07 DIAGNOSIS — E782 Mixed hyperlipidemia: Secondary | ICD-10-CM | POA: Diagnosis not present

## 2012-11-07 DIAGNOSIS — E291 Testicular hypofunction: Secondary | ICD-10-CM | POA: Diagnosis not present

## 2012-11-10 ENCOUNTER — Ambulatory Visit: Payer: Medicare Other | Attending: Neurology | Admitting: Speech Pathology

## 2012-11-10 DIAGNOSIS — R471 Dysarthria and anarthria: Secondary | ICD-10-CM | POA: Insufficient documentation

## 2012-11-10 DIAGNOSIS — R269 Unspecified abnormalities of gait and mobility: Secondary | ICD-10-CM | POA: Insufficient documentation

## 2012-11-10 DIAGNOSIS — R279 Unspecified lack of coordination: Secondary | ICD-10-CM | POA: Insufficient documentation

## 2012-11-10 DIAGNOSIS — R4189 Other symptoms and signs involving cognitive functions and awareness: Secondary | ICD-10-CM | POA: Insufficient documentation

## 2012-11-10 DIAGNOSIS — M2420 Disorder of ligament, unspecified site: Secondary | ICD-10-CM | POA: Insufficient documentation

## 2012-11-10 DIAGNOSIS — Z5189 Encounter for other specified aftercare: Secondary | ICD-10-CM | POA: Diagnosis not present

## 2012-11-10 DIAGNOSIS — G20 Parkinson's disease: Secondary | ICD-10-CM | POA: Insufficient documentation

## 2012-11-10 DIAGNOSIS — M629 Disorder of muscle, unspecified: Secondary | ICD-10-CM | POA: Diagnosis not present

## 2012-11-10 DIAGNOSIS — R1310 Dysphagia, unspecified: Secondary | ICD-10-CM | POA: Insufficient documentation

## 2012-11-10 DIAGNOSIS — G20A1 Parkinson's disease without dyskinesia, without mention of fluctuations: Secondary | ICD-10-CM | POA: Insufficient documentation

## 2012-11-11 DIAGNOSIS — H4011X0 Primary open-angle glaucoma, stage unspecified: Secondary | ICD-10-CM | POA: Diagnosis not present

## 2012-11-11 DIAGNOSIS — Z961 Presence of intraocular lens: Secondary | ICD-10-CM | POA: Diagnosis not present

## 2012-11-11 DIAGNOSIS — H409 Unspecified glaucoma: Secondary | ICD-10-CM | POA: Diagnosis not present

## 2012-11-11 DIAGNOSIS — H02109 Unspecified ectropion of unspecified eye, unspecified eyelid: Secondary | ICD-10-CM | POA: Diagnosis not present

## 2012-11-14 ENCOUNTER — Ambulatory Visit: Payer: Medicare Other

## 2012-11-18 ENCOUNTER — Ambulatory Visit: Payer: Medicare Other | Admitting: Occupational Therapy

## 2012-11-18 ENCOUNTER — Ambulatory Visit: Payer: Medicare Other

## 2012-11-19 ENCOUNTER — Ambulatory Visit: Payer: Medicare Other | Admitting: Speech Pathology

## 2012-11-19 ENCOUNTER — Ambulatory Visit: Payer: Medicare Other | Admitting: Occupational Therapy

## 2012-11-24 DIAGNOSIS — M48061 Spinal stenosis, lumbar region without neurogenic claudication: Secondary | ICD-10-CM | POA: Diagnosis not present

## 2012-11-24 DIAGNOSIS — M545 Low back pain, unspecified: Secondary | ICD-10-CM | POA: Diagnosis not present

## 2012-11-25 ENCOUNTER — Ambulatory Visit: Payer: Medicare Other | Admitting: Occupational Therapy

## 2012-11-25 ENCOUNTER — Ambulatory Visit: Payer: Medicare Other

## 2012-11-27 ENCOUNTER — Ambulatory Visit: Payer: Medicare Other | Admitting: Occupational Therapy

## 2012-11-27 ENCOUNTER — Ambulatory Visit: Payer: Medicare Other | Admitting: Speech Pathology

## 2012-11-28 DIAGNOSIS — I10 Essential (primary) hypertension: Secondary | ICD-10-CM | POA: Diagnosis not present

## 2012-11-28 DIAGNOSIS — E559 Vitamin D deficiency, unspecified: Secondary | ICD-10-CM | POA: Diagnosis not present

## 2012-12-02 ENCOUNTER — Ambulatory Visit: Payer: Medicare Other | Admitting: Occupational Therapy

## 2012-12-04 ENCOUNTER — Ambulatory Visit: Payer: Medicare Other | Admitting: Occupational Therapy

## 2012-12-22 DIAGNOSIS — M545 Low back pain, unspecified: Secondary | ICD-10-CM | POA: Diagnosis not present

## 2012-12-26 ENCOUNTER — Other Ambulatory Visit: Payer: Self-pay | Admitting: Neurology

## 2013-01-12 DIAGNOSIS — E291 Testicular hypofunction: Secondary | ICD-10-CM | POA: Diagnosis not present

## 2013-01-16 ENCOUNTER — Encounter: Payer: Self-pay | Admitting: Neurology

## 2013-01-16 ENCOUNTER — Ambulatory Visit (INDEPENDENT_AMBULATORY_CARE_PROVIDER_SITE_OTHER): Payer: Medicare Other | Admitting: Neurology

## 2013-01-16 VITALS — BP 132/85 | HR 88 | Temp 98.5°F | Ht 70.0 in | Wt 208.0 lb

## 2013-01-16 DIAGNOSIS — G4700 Insomnia, unspecified: Secondary | ICD-10-CM

## 2013-01-16 DIAGNOSIS — G20 Parkinson's disease: Secondary | ICD-10-CM

## 2013-01-16 DIAGNOSIS — R413 Other amnesia: Secondary | ICD-10-CM

## 2013-01-16 DIAGNOSIS — G20C Parkinsonism, unspecified: Secondary | ICD-10-CM

## 2013-01-16 HISTORY — DX: Parkinsonism, unspecified: G20.C

## 2013-01-16 HISTORY — DX: Parkinson's disease: G20

## 2013-01-16 MED ORDER — AMITRIPTYLINE HCL 25 MG PO TABS: 25.0000 mg | ORAL_TABLET | Freq: Every day | ORAL | Status: DC

## 2013-01-16 NOTE — Patient Instructions (Addendum)
I think overall you are doing fairly well and are stable at this point.  I do have some generic suggestions for you today:  Please make sure that you drink plenty of fluids. I would like for you to exercise daily for example in the form of walking 20-30 minutes every day, if you can. Please keep a regular sleep-wake schedule, keep regular meal times, do not skip any meals, eat  healthy snacks in between meals, such as fruit or nuts. Try to eat protein with every meal.   Engage in social activities in your community and with your family and try to keep up with current events by reading the newspaper or watching the news.  I do not think we need to make any changes in your medications at this point. I think you're stable enough that I can see you back in 6 months, sooner if we need to. Please call us if you have any interim questions, concerns, or problems or updates to need to discuss.  Please also call us for any test results so we can go over those with you on the phone. Steve is my clinical assistant and will answer any of your questions and relay your messages to me and will give you my messages.   Our phone number is [phone]. We also have an after hours call service for urgent matters and there is a physician on-call for urgent questions. For any emergencies you know to call 911 or go to the nearest emergency room.      

## 2013-01-16 NOTE — Progress Notes (Signed)
Subjective:    Patient ID: [name] is a 67 y.o. male.  HPI Interim history:  Interim history: Mr. [name] is a very pleasant 67 year old left-handed gentleman who presents for FU consultation of his parkinsonism, concern for PSP. He has a Hx of progressive gait difficulties, balance problems, speech impairment. I first met him on 10/24/12, at which time I suggested no medication changes. He is accompanied by his wife today. He was on amitriptyline, which had been started by Dr. Vickey Huger in November last year. He had reported, that his gait was a little better since the amitriptyline, but he called in the interim in February and requested another medication. I suggested a trial of low dose of Sinemet 1/2 pill tid, but he called back d/t sedation and eventually stopped it and re-started Elavil. He has benefitted from therapy.  He has recently finished Physical therapy and did fairly well, he also finished occupational therapy. I reviewed the discharge summary from speech therapy which she had from December through March. His wife believes that he has had some improvement with all his therapies which were ordered last time. He also was given a trial of Nuedexta for his pseudobulbar affect in the past, however he was not able to tolerate it as it made him very sleepy. Similarly, he had sedation on carbidopa-levodopa as well as mirapex low-dose. He is currently not on any anti-Parkinson's medication. He has fallen in the past And in fact fell yesterday when he was carrying a heavy bag of mulch down some stairs and she believes that carrying the mulch that made him unable to catch himself. He did not hurt himself but did scrape his left lateral leg right above the ankle. He has had some problems swallowing particularly when eating too fast. His wife indicates that he has a tendency to try to swallow too big bites. He has no particular difficulty with any specific consistencies.He reports no significant  issues with constipation but has had problems with bladder control sometimes. He says he does not always make it to the bathroom on time. There are no significant issues with bladder retention. He has not had any fainting spells. He has had some mild forgetfulness but nothing progressive or very concerning.  His Past Medical History Is Significant For: Past Medical History  Diagnosis Date  . HTN (hypertension)   . Glaucoma(365)   . OSA (obstructive sleep apnea)   . Allergic rhinitis     His Past Surgical History Is Significant For: Past Surgical History  Procedure Laterality Date  . Uvulopalatopharyngoplasty    . Lumbar disc surgery    . Cataract extraction      His Family History Is Significant For: Family History  Problem Relation Age of Onset  . Emphysema Mother   . Liver cancer Father     His Social History Is Significant For: History   Social History  . Marital Status: Married    Spouse Name: N/A    Number of Children: N/A  . Years of Education: N/A   Occupational History  . branch operation manager    Social History Main Topics  . Smoking status: Never Smoker   . Smokeless tobacco: Never Used  . Alcohol Use: No  . Drug Use: None  . Sexually Active: None   Other Topics Concern  . None   Social History Narrative  . None    His Allergies Are:  No Known Allergies:   His Current Medications Are:  Outpatient Encounter Prescriptions  as of 01/16/2013  Medication Sig Dispense Refill  . amitriptyline (ELAVIL) 25 MG tablet TAKE 1 TABLET BY MOUTH AT BEDTIME  30 tablet  12  . aspirin 81 MG tablet Take 81 mg by mouth daily.        Marland Kitchen azelastine (ASTELIN) 137 MCG/SPRAY nasal spray       . bimatoprost (LUMIGAN) 0.01 % SOLN Place 1 drop into both eyes at bedtime.        . Cholecalciferol (VITAMIN D3) 10000 UNITS capsule Take 10,000 Units by mouth. 3 times a week       . fexofenadine (ALLEGRA) 180 MG tablet Take 180 mg by mouth daily.        . fish oil-omega-3 fatty  acids 1000 MG capsule Take by mouth daily. 2400 mg on Tuesday, Thursday, and saturday      . fluticasone (FLONASE) 50 MCG/ACT nasal spray Place 2 sprays into the nose daily as needed.       Marland Kitchen levothyroxine (SYNTHROID, LEVOTHROID) 50 MCG tablet       . losartan (COZAAR) 100 MG tablet Take 100 mg by mouth daily.      . magnesium gluconate (MAGONATE) 500 MG tablet Take 500 mg by mouth as needed.      . Multiple Vitamins-Minerals (MULTIVITAMIN WITH MINERALS) tablet Take 1 tablet by mouth daily.        . Naproxen Sodium (ALEVE PO) Take by mouth as needed.      . pravastatin (PRAVACHOL) 20 MG tablet Take 20 mg by mouth. M W F nights      . testosterone cypionate (DEPOTESTOTERONE CYPIONATE) 200 MG/ML injection       . cetirizine (ZYRTEC) 10 MG tablet Take 10 mg by mouth as needed.      . Loratadine 5 MG TBDP Take 1 tablet by mouth daily as needed.       . nebivolol (BYSTOLIC) 10 MG tablet Take 10 mg by mouth daily.        . phenylephrine (SUDAFED PE) 10 MG TABS Take 10 mg by mouth every 4 (four) hours as needed.         No facility-administered encounter medications on file as of 01/16/2013.  :  Review of Systems  Constitutional: Positive for fatigue.  HENT: Positive for hearing loss and trouble swallowing.   Eyes: Positive for visual disturbance (blurred vision).  Respiratory: Positive for choking.        Snoring  Gastrointestinal: Positive for constipation.  Endocrine: Positive for polydipsia.  Genitourinary:       Incontinence, impotence  Allergic/Immunologic: Positive for environmental allergies.  Neurological: Positive for speech difficulty (slurred speach).       Memory loss, restless legs  Psychiatric/Behavioral:       Decreased energy    Objective:  Neurologic Exam  Physical Exam Physical Examination:   Filed Vitals:   01/16/13 1150  BP: 132/85  Pulse: 88  Temp: 98.5 F (36.9 C)   General Examination: The patient is a very pleasant 67 y.o. male in no acute  distress.  HEENT exam reveals significant nuchal rigidity with decrease in passive range of motion. He has significant masked facies. He has a significant decrease eye blink rate. Extraocular tracking testing shows some saccadic breakdown and no clear limitation in gaze excursion. No nystagmus is noted. Pupils are equal, round and reactive to light. Oropharynx is clear. His speech is moderately to severely dysarthric at times. Hearing is grossly intact. Chest is clear to auscultation, heart sounds are  normal and abdominal sounds are normal as well. Abdomen is soft nontender. He has no pitting edema in the distal lower extremities bilaterally; he has a band aid on the L lateral leg above the ankle. Neurologically: Mental status: The patient is awake, alert and oriented in all 3 spheres. His memory, attention, language and knowledge are fairly appropriate in accurate. Cranial nerves are as described under HEENT exam. Motor exam: He has normal bulk and increased tone in all 4 extremities, right more than left. There is some cogwheeling in the upper extremities bilaterally. He has no resting tremor but does have a mild postural tremor in both upper extremities. On fine motor testing he has mild to moderate impairment of finger taps bilaterally. Hand movements are mildly impaired. Foot taps a mildly impaired on the left and mild to moderately impaired on the right. Foot agility is fairly good on the left and mildly impaired on the right. Rapid alternating patting is mildly impaired on the right and minimally impaired on the left. He is able to stand up from the seated position without assistance. His posture is mild to moderately stooped. He walks with fairly good pace but decreased stride length for his size and decrease in arm swing bilaterally. He turns in two or 3 steps. Balance is mildly impaired. Sensory exam is intact to light touch, pinprick, temperature and vibration. Romberg testing shows mild swaying but no  corrective steps.   Assessment and Plan:     In summary, Mr. Burkett is a 67 year old left-handed gentleman with a approximately five-year history of progressive gait disorder, speech impairment, balance problems with falls and evidence of pseudobulbar affect in the past. His history and physical examination are concerning for parkinsonism and possibly PSP or progressive supranuclear palsy. He and his wife understand that there are no curative treatments at this time and he has been tried on some dopaminergic medications but had side effects of sedation, with a recent retrial of low dose Sinemet. At some point down the road we should do a swallow study. I had a long chat again with the patient and his wife regarding his Sx, my findings. He was again questioning whether he could have NPH. His last CTH in 5/13 did not indicate this and his clinical exam does not suggest it. Nevertheless, at his request I re-ordered a CT head. I Suggested a followup in 6 months, sooner if the need arises and he is encouraged to call with any questions, concerns, problems, updates or refill requests and we will be calling him back for his CT head results as well. He and his wife were in agreement.

## 2013-01-21 ENCOUNTER — Ambulatory Visit
Admission: RE | Admit: 2013-01-21 | Discharge: 2013-01-21 | Disposition: A | Discharge status: Home / Self Care | Payer: Medicare Other | Source: Ambulatory Visit | Attending: Neurology | Admitting: Neurology

## 2013-01-21 DIAGNOSIS — G20 Parkinson's disease: Secondary | ICD-10-CM | POA: Diagnosis not present

## 2013-01-21 DIAGNOSIS — R413 Other amnesia: Secondary | ICD-10-CM | POA: Diagnosis not present

## 2013-01-22 NOTE — Progress Notes (Signed)
Quick Note:  Please call and advise the patient that the recent scan we did were within normal limits. We checked a head CT and it was reported as normal. Please remind patient to keep any upcoming appointments and call with any interim questions, concerns, problems or updates. Thanks,  Huston Foley, MD, PhD  ______

## 2013-01-23 ENCOUNTER — Telehealth: Payer: Self-pay

## 2013-01-23 NOTE — Telephone Encounter (Signed)
Message copied by Doree Barthel on Fri Jan 23, 2013  5:24 PM ------      Message from: Lillia Abed      Created: Fri Jan 23, 2013  5:04 PM      Contact: Mr. Rostro       Please call PT with CAT Scan Results, if possible before the weekend. Phone number [phone] ------

## 2013-01-23 NOTE — Progress Notes (Signed)
Quick Note:  Left a message on the pt's home voice mail (vm was in the patient's voice and she stated her name) regarding his recent CT being within normal limits. Contact information was given so that he may call with any questions or concerns.   ______

## 2013-01-26 NOTE — Telephone Encounter (Signed)
[name] left messge previously.

## 2013-02-02 DIAGNOSIS — E291 Testicular hypofunction: Secondary | ICD-10-CM | POA: Diagnosis not present

## 2013-02-23 DIAGNOSIS — E291 Testicular hypofunction: Secondary | ICD-10-CM | POA: Diagnosis not present

## 2013-03-10 DIAGNOSIS — H409 Unspecified glaucoma: Secondary | ICD-10-CM | POA: Diagnosis not present

## 2013-03-10 DIAGNOSIS — H4011X0 Primary open-angle glaucoma, stage unspecified: Secondary | ICD-10-CM | POA: Diagnosis not present

## 2013-03-17 DIAGNOSIS — E291 Testicular hypofunction: Secondary | ICD-10-CM | POA: Diagnosis not present

## 2013-03-24 ENCOUNTER — Ambulatory Visit: Payer: Medicare Other | Admitting: Physical Therapy

## 2013-03-24 ENCOUNTER — Ambulatory Visit: Payer: Medicare Other

## 2013-03-24 ENCOUNTER — Ambulatory Visit: Payer: Medicare Other | Admitting: Occupational Therapy

## 2013-04-06 DIAGNOSIS — E559 Vitamin D deficiency, unspecified: Secondary | ICD-10-CM | POA: Diagnosis not present

## 2013-04-06 DIAGNOSIS — Z79899 Other long term (current) drug therapy: Secondary | ICD-10-CM | POA: Diagnosis not present

## 2013-04-06 DIAGNOSIS — E782 Mixed hyperlipidemia: Secondary | ICD-10-CM | POA: Diagnosis not present

## 2013-04-06 DIAGNOSIS — N529 Male erectile dysfunction, unspecified: Secondary | ICD-10-CM | POA: Diagnosis not present

## 2013-04-06 DIAGNOSIS — I10 Essential (primary) hypertension: Secondary | ICD-10-CM | POA: Diagnosis not present

## 2013-04-06 DIAGNOSIS — E039 Hypothyroidism, unspecified: Secondary | ICD-10-CM | POA: Diagnosis not present

## 2013-04-14 ENCOUNTER — Ambulatory Visit: Payer: Medicare Other | Admitting: Occupational Therapy

## 2013-04-14 ENCOUNTER — Ambulatory Visit: Payer: Medicare Other

## 2013-04-14 ENCOUNTER — Ambulatory Visit: Payer: Medicare Other | Attending: Neurology | Admitting: Physical Therapy

## 2013-04-14 DIAGNOSIS — R471 Dysarthria and anarthria: Secondary | ICD-10-CM | POA: Insufficient documentation

## 2013-04-14 DIAGNOSIS — M629 Disorder of muscle, unspecified: Secondary | ICD-10-CM | POA: Insufficient documentation

## 2013-04-14 DIAGNOSIS — M2420 Disorder of ligament, unspecified site: Secondary | ICD-10-CM | POA: Insufficient documentation

## 2013-04-14 DIAGNOSIS — G20A1 Parkinson's disease without dyskinesia, without mention of fluctuations: Secondary | ICD-10-CM | POA: Insufficient documentation

## 2013-04-14 DIAGNOSIS — R1310 Dysphagia, unspecified: Secondary | ICD-10-CM | POA: Insufficient documentation

## 2013-04-14 DIAGNOSIS — R279 Unspecified lack of coordination: Secondary | ICD-10-CM | POA: Insufficient documentation

## 2013-04-14 DIAGNOSIS — R4189 Other symptoms and signs involving cognitive functions and awareness: Secondary | ICD-10-CM | POA: Insufficient documentation

## 2013-04-14 DIAGNOSIS — R269 Unspecified abnormalities of gait and mobility: Secondary | ICD-10-CM | POA: Insufficient documentation

## 2013-04-14 DIAGNOSIS — G20 Parkinson's disease: Secondary | ICD-10-CM | POA: Insufficient documentation

## 2013-04-14 DIAGNOSIS — Z5189 Encounter for other specified aftercare: Secondary | ICD-10-CM | POA: Insufficient documentation

## 2013-04-27 DIAGNOSIS — E291 Testicular hypofunction: Secondary | ICD-10-CM | POA: Diagnosis not present

## 2013-05-13 ENCOUNTER — Telehealth: Payer: Self-pay | Admitting: Neurology

## 2013-05-18 DIAGNOSIS — E291 Testicular hypofunction: Secondary | ICD-10-CM | POA: Diagnosis not present

## 2013-05-26 ENCOUNTER — Ambulatory Visit: Payer: Medicare Other | Attending: Neurology | Admitting: Physical Therapy

## 2013-05-26 ENCOUNTER — Ambulatory Visit: Payer: Medicare Other

## 2013-05-26 ENCOUNTER — Ambulatory Visit: Payer: Medicare Other | Admitting: Occupational Therapy

## 2013-05-26 DIAGNOSIS — R269 Unspecified abnormalities of gait and mobility: Secondary | ICD-10-CM | POA: Diagnosis not present

## 2013-05-26 DIAGNOSIS — R279 Unspecified lack of coordination: Secondary | ICD-10-CM | POA: Insufficient documentation

## 2013-05-26 DIAGNOSIS — R4189 Other symptoms and signs involving cognitive functions and awareness: Secondary | ICD-10-CM | POA: Insufficient documentation

## 2013-05-26 DIAGNOSIS — G20 Parkinson's disease: Secondary | ICD-10-CM | POA: Diagnosis not present

## 2013-05-26 DIAGNOSIS — M629 Disorder of muscle, unspecified: Secondary | ICD-10-CM | POA: Insufficient documentation

## 2013-05-26 DIAGNOSIS — R1310 Dysphagia, unspecified: Secondary | ICD-10-CM | POA: Insufficient documentation

## 2013-05-26 DIAGNOSIS — M2420 Disorder of ligament, unspecified site: Secondary | ICD-10-CM | POA: Insufficient documentation

## 2013-05-26 DIAGNOSIS — R471 Dysarthria and anarthria: Secondary | ICD-10-CM | POA: Diagnosis not present

## 2013-05-26 DIAGNOSIS — Z5189 Encounter for other specified aftercare: Secondary | ICD-10-CM | POA: Insufficient documentation

## 2013-05-26 DIAGNOSIS — G20A1 Parkinson's disease without dyskinesia, without mention of fluctuations: Secondary | ICD-10-CM | POA: Insufficient documentation

## 2013-06-02 ENCOUNTER — Ambulatory Visit: Payer: Medicare Other

## 2013-06-02 ENCOUNTER — Ambulatory Visit: Payer: Medicare Other | Admitting: Physical Therapy

## 2013-06-10 ENCOUNTER — Ambulatory Visit: Payer: Medicare Other | Admitting: Occupational Therapy

## 2013-06-10 ENCOUNTER — Ambulatory Visit: Payer: Medicare Other | Attending: Neurology | Admitting: Physical Therapy

## 2013-06-10 ENCOUNTER — Ambulatory Visit: Payer: Medicare Other | Admitting: Speech Pathology

## 2013-06-10 DIAGNOSIS — R4189 Other symptoms and signs involving cognitive functions and awareness: Secondary | ICD-10-CM | POA: Insufficient documentation

## 2013-06-10 DIAGNOSIS — R471 Dysarthria and anarthria: Secondary | ICD-10-CM | POA: Insufficient documentation

## 2013-06-10 DIAGNOSIS — R1310 Dysphagia, unspecified: Secondary | ICD-10-CM | POA: Diagnosis not present

## 2013-06-10 DIAGNOSIS — G20 Parkinson's disease: Secondary | ICD-10-CM | POA: Diagnosis not present

## 2013-06-10 DIAGNOSIS — R279 Unspecified lack of coordination: Secondary | ICD-10-CM | POA: Insufficient documentation

## 2013-06-10 DIAGNOSIS — Z5189 Encounter for other specified aftercare: Secondary | ICD-10-CM | POA: Insufficient documentation

## 2013-06-10 DIAGNOSIS — G20A1 Parkinson's disease without dyskinesia, without mention of fluctuations: Secondary | ICD-10-CM | POA: Insufficient documentation

## 2013-06-10 DIAGNOSIS — M629 Disorder of muscle, unspecified: Secondary | ICD-10-CM | POA: Diagnosis not present

## 2013-06-10 DIAGNOSIS — R269 Unspecified abnormalities of gait and mobility: Secondary | ICD-10-CM | POA: Insufficient documentation

## 2013-06-10 DIAGNOSIS — M2420 Disorder of ligament, unspecified site: Secondary | ICD-10-CM | POA: Insufficient documentation

## 2013-06-12 ENCOUNTER — Ambulatory Visit: Payer: Medicare Other

## 2013-06-12 ENCOUNTER — Ambulatory Visit: Payer: Medicare Other | Admitting: Rehabilitative and Restorative Service Providers"

## 2013-06-12 DIAGNOSIS — E291 Testicular hypofunction: Secondary | ICD-10-CM | POA: Diagnosis not present

## 2013-06-12 DIAGNOSIS — R279 Unspecified lack of coordination: Secondary | ICD-10-CM | POA: Diagnosis not present

## 2013-06-12 DIAGNOSIS — R269 Unspecified abnormalities of gait and mobility: Secondary | ICD-10-CM | POA: Diagnosis not present

## 2013-06-12 DIAGNOSIS — Z5189 Encounter for other specified aftercare: Secondary | ICD-10-CM | POA: Diagnosis not present

## 2013-06-12 DIAGNOSIS — R4189 Other symptoms and signs involving cognitive functions and awareness: Secondary | ICD-10-CM | POA: Diagnosis not present

## 2013-06-12 DIAGNOSIS — G20 Parkinson's disease: Secondary | ICD-10-CM | POA: Diagnosis not present

## 2013-06-12 DIAGNOSIS — M629 Disorder of muscle, unspecified: Secondary | ICD-10-CM | POA: Diagnosis not present

## 2013-06-15 ENCOUNTER — Encounter: Payer: Self-pay | Admitting: Internal Medicine

## 2013-06-15 ENCOUNTER — Encounter: Payer: Medicare Other | Admitting: Speech Pathology

## 2013-06-15 ENCOUNTER — Ambulatory Visit (INDEPENDENT_AMBULATORY_CARE_PROVIDER_SITE_OTHER): Payer: Medicare Other | Admitting: Internal Medicine

## 2013-06-15 VITALS — BP 126/68 | HR 73 | Ht 70.0 in | Wt 207.0 lb

## 2013-06-15 DIAGNOSIS — J302 Other seasonal allergic rhinitis: Secondary | ICD-10-CM

## 2013-06-15 DIAGNOSIS — J309 Allergic rhinitis, unspecified: Secondary | ICD-10-CM | POA: Diagnosis not present

## 2013-06-15 MED ORDER — METHYLPREDNISOLONE ACETATE 80 MG/ML IJ SUSP
80.0000 mg | Freq: Once | INTRAMUSCULAR | Status: AC
  Administered 2013-06-15: 80 mg via INTRAMUSCULAR

## 2013-06-15 MED ORDER — AZELASTINE-FLUTICASONE 137-50 MCG/ACT NA SUSP: 2.0000 | Freq: Every day | NASAL | Status: DC

## 2013-06-15 NOTE — Patient Instructions (Addendum)
Depo 80  Sample Dymista nasal spray     Try 1-2 puffs once daily at bedtime, instead of Flonase.    If needed, this can be used twice daily  If the watery nose remains a problem when you eat, I can try ipratropium 0.03% nasal spray, if ok with the Eye Doctor who treats your glaucoma.

## 2013-06-15 NOTE — Progress Notes (Signed)
Subjective:    Patient ID: [name], male    DOB: 22-Sep-1946, 67 y.o.   MRN: [id number]  HPI 05/30/11- 65 yoM followed for allergic rhinitis, complicated by insomnia, glaucoma, HBP. Last here May 31, 2010-  Expects most problems in the Fall Ragweed season. Pollen levels have not started to surge yet, but he reports some nasal congestion and eyes watering. Minimal chest tightness mainly with bending over.I discussed this as related more to abdominal girth than to lung function.  Med list review- he seems unaware of loratadine or allegra despite discussion last year, and avoids allergy and sinus meds. . He asks about Agent Orange, assuming he was exposed, but not knowing or making a specific attribution. I referred him to the [place] for that consideration. He indicates concern that he is slowing down and forgetful. Neurology evaluation by Dr Thad Ranger over a year ago was negative.   06/11/12-  65 yoM followed for allergic rhinitis, complicated by insomnia, glaucoma, HBP.  PCP Dr Oneta Rack  Patient states allergies are worse than last year. c/o recent onset of cough with white mucus, wheezing, runny nose, post nasal drip, and itchy eyes. He denies reflux symptoms. Taking one half Zyrtec tablet at bedtime. A whole tablet too strong and overdry skin. Diagnosed with early Parkinson's disease. Was recent blood donor.  06/15/13- 65 yoM followed for allergic rhinitis, complicated by insomnia, glaucoma, HBP, Parkinson's.  PCP Dr Oneta Rack FOLLOWS NFA:OZHYQ up of allergies recently(been on Allegra and Mucinex past 6 weeks)feels like its helped; drainage is clear in color. Increased watery rhinorrhea. Tends to be worse in spring and fall pollen seasons. Has glaucoma.  Review of Systems- see HPI Constitutional:   No-   weight loss, night sweats, fevers, chills, fatigue, lassitude. HEENT:   No-  headaches, difficulty swallowing, tooth/dental problems, sore throat,       +  sneezing, itching, ear ache, nasal  congestion, +post nasal drip,  CV:  No-   chest pain, orthopnea, PND, swelling in lower extremities, anasarca, dizziness, palpitations Resp: No-   shortness of breath with exertion or at rest.              No-   productive cough,  No non-productive cough,  No-  coughing up of blood.              No-   change in color of mucus.  No- wheezing.   Skin: No-   rash or lesions. GI:  No-   heartburn, indigestion, abdominal pain, nausea, vomiting,  GU:  MS:  No-   joint pain or swelling.  . Neuro- +some stiffness of movement:  Psych:  No- change in mood or affect. No depression or anxiety.  No memory loss.  Objective:   Physical Exam General- Alert, Oriented, Affect-appropriate, Distress- none acute Skin- rash-none, lesions- none, excoriation- none Lymphadenopathy- none Head- atraumatic            Eyes- Gross vision intact, PERRLA, conjunctivae clear secretions            Ears- Hearing, canals normal            Nose- Clear, No- Septal dev, mucus, polyps, erosion, perforation             Throat- +s/p UPPP, IV, + tongue coated, +cobblestone pharynx , drainage- none, tonsils- atrophic Neck- flexible , trachea midline, no stridor , thyroid nl, carotid no bruit Chest - symmetrical excursion , unlabored  Heart/CV- RRR , no murmur , no gallop  , no rub, nl s1 s2                           - JVD- none , edema- none, stasis changes- none, varices- none           Lung- clear to P&A, wheeze- none, cough- none , dullness-none, rub- none           Chest wall-  Abd-  Br/ Gen/ Rectal- Not done, not indicated Extrem- cyanosis- none, clubbing, none, atrophy- none, strength- nl Neuro- + absent facial expression, no tremor  Assessment & Plan:

## 2013-06-16 ENCOUNTER — Encounter: Payer: Medicare Other | Admitting: Occupational Therapy

## 2013-06-16 ENCOUNTER — Ambulatory Visit: Payer: Medicare Other | Admitting: Physical Therapy

## 2013-06-18 ENCOUNTER — Ambulatory Visit: Payer: Medicare Other | Admitting: Physical Therapy

## 2013-06-18 ENCOUNTER — Ambulatory Visit: Payer: Medicare Other | Admitting: Occupational Therapy

## 2013-06-18 DIAGNOSIS — R4189 Other symptoms and signs involving cognitive functions and awareness: Secondary | ICD-10-CM | POA: Diagnosis not present

## 2013-06-18 DIAGNOSIS — G20 Parkinson's disease: Secondary | ICD-10-CM | POA: Diagnosis not present

## 2013-06-18 DIAGNOSIS — Z5189 Encounter for other specified aftercare: Secondary | ICD-10-CM | POA: Diagnosis not present

## 2013-06-18 DIAGNOSIS — R269 Unspecified abnormalities of gait and mobility: Secondary | ICD-10-CM | POA: Diagnosis not present

## 2013-06-18 DIAGNOSIS — R279 Unspecified lack of coordination: Secondary | ICD-10-CM | POA: Diagnosis not present

## 2013-06-18 DIAGNOSIS — M629 Disorder of muscle, unspecified: Secondary | ICD-10-CM | POA: Diagnosis not present

## 2013-06-21 NOTE — Assessment & Plan Note (Signed)
Plan-try sample Dymista, Depo-Medrol. Consider a trial of ipratropium nasal spray 0.03% if allowed by his ophthalmologist ( hx glaucoma)

## 2013-06-23 ENCOUNTER — Ambulatory Visit: Payer: Medicare Other | Admitting: Occupational Therapy

## 2013-06-23 ENCOUNTER — Ambulatory Visit: Payer: Medicare Other | Admitting: Physical Therapy

## 2013-06-23 ENCOUNTER — Ambulatory Visit: Payer: Medicare Other | Admitting: Speech Pathology

## 2013-06-23 DIAGNOSIS — R269 Unspecified abnormalities of gait and mobility: Secondary | ICD-10-CM | POA: Diagnosis not present

## 2013-06-23 DIAGNOSIS — R279 Unspecified lack of coordination: Secondary | ICD-10-CM | POA: Diagnosis not present

## 2013-06-23 DIAGNOSIS — R4189 Other symptoms and signs involving cognitive functions and awareness: Secondary | ICD-10-CM | POA: Diagnosis not present

## 2013-06-23 DIAGNOSIS — Z5189 Encounter for other specified aftercare: Secondary | ICD-10-CM | POA: Diagnosis not present

## 2013-06-23 DIAGNOSIS — M629 Disorder of muscle, unspecified: Secondary | ICD-10-CM | POA: Diagnosis not present

## 2013-06-23 DIAGNOSIS — G20 Parkinson's disease: Secondary | ICD-10-CM | POA: Diagnosis not present

## 2013-06-25 ENCOUNTER — Ambulatory Visit: Payer: Medicare Other | Admitting: Occupational Therapy

## 2013-06-25 ENCOUNTER — Ambulatory Visit: Payer: Medicare Other | Admitting: Physical Therapy

## 2013-06-25 ENCOUNTER — Ambulatory Visit: Payer: Medicare Other | Admitting: Neurology

## 2013-06-25 ENCOUNTER — Ambulatory Visit: Payer: Medicare Other

## 2013-06-25 DIAGNOSIS — R269 Unspecified abnormalities of gait and mobility: Secondary | ICD-10-CM | POA: Diagnosis not present

## 2013-06-25 DIAGNOSIS — R279 Unspecified lack of coordination: Secondary | ICD-10-CM | POA: Diagnosis not present

## 2013-06-25 DIAGNOSIS — Z5189 Encounter for other specified aftercare: Secondary | ICD-10-CM | POA: Diagnosis not present

## 2013-06-25 DIAGNOSIS — M629 Disorder of muscle, unspecified: Secondary | ICD-10-CM | POA: Diagnosis not present

## 2013-06-25 DIAGNOSIS — G20 Parkinson's disease: Secondary | ICD-10-CM | POA: Diagnosis not present

## 2013-06-25 DIAGNOSIS — E782 Mixed hyperlipidemia: Secondary | ICD-10-CM | POA: Diagnosis not present

## 2013-06-25 DIAGNOSIS — R7989 Other specified abnormal findings of blood chemistry: Secondary | ICD-10-CM | POA: Diagnosis not present

## 2013-06-25 DIAGNOSIS — R4189 Other symptoms and signs involving cognitive functions and awareness: Secondary | ICD-10-CM | POA: Diagnosis not present

## 2013-06-30 ENCOUNTER — Ambulatory Visit: Payer: Medicare Other | Admitting: Occupational Therapy

## 2013-06-30 ENCOUNTER — Ambulatory Visit: Payer: Medicare Other

## 2013-06-30 ENCOUNTER — Ambulatory Visit: Payer: Medicare Other | Admitting: Physical Therapy

## 2013-06-30 DIAGNOSIS — Z5189 Encounter for other specified aftercare: Secondary | ICD-10-CM | POA: Diagnosis not present

## 2013-06-30 DIAGNOSIS — R269 Unspecified abnormalities of gait and mobility: Secondary | ICD-10-CM | POA: Diagnosis not present

## 2013-06-30 DIAGNOSIS — R279 Unspecified lack of coordination: Secondary | ICD-10-CM | POA: Diagnosis not present

## 2013-06-30 DIAGNOSIS — M629 Disorder of muscle, unspecified: Secondary | ICD-10-CM | POA: Diagnosis not present

## 2013-06-30 DIAGNOSIS — R4189 Other symptoms and signs involving cognitive functions and awareness: Secondary | ICD-10-CM | POA: Diagnosis not present

## 2013-06-30 DIAGNOSIS — G20 Parkinson's disease: Secondary | ICD-10-CM | POA: Diagnosis not present

## 2013-07-02 ENCOUNTER — Encounter: Payer: Medicare Other | Admitting: Speech Pathology

## 2013-07-02 ENCOUNTER — Ambulatory Visit: Payer: Medicare Other | Admitting: Physical Therapy

## 2013-07-02 ENCOUNTER — Encounter: Payer: Medicare Other | Admitting: Occupational Therapy

## 2013-07-03 DIAGNOSIS — E291 Testicular hypofunction: Secondary | ICD-10-CM | POA: Diagnosis not present

## 2013-07-03 DIAGNOSIS — R5381 Other malaise: Secondary | ICD-10-CM | POA: Diagnosis not present

## 2013-07-03 DIAGNOSIS — E538 Deficiency of other specified B group vitamins: Secondary | ICD-10-CM | POA: Diagnosis not present

## 2013-07-03 DIAGNOSIS — Z23 Encounter for immunization: Secondary | ICD-10-CM | POA: Diagnosis not present

## 2013-07-03 DIAGNOSIS — I10 Essential (primary) hypertension: Secondary | ICD-10-CM | POA: Diagnosis not present

## 2013-07-03 DIAGNOSIS — D649 Anemia, unspecified: Secondary | ICD-10-CM | POA: Diagnosis not present

## 2013-07-03 DIAGNOSIS — E782 Mixed hyperlipidemia: Secondary | ICD-10-CM | POA: Diagnosis not present

## 2013-07-14 DIAGNOSIS — Z23 Encounter for immunization: Secondary | ICD-10-CM | POA: Diagnosis not present

## 2013-07-15 DIAGNOSIS — Z85828 Personal history of other malignant neoplasm of skin: Secondary | ICD-10-CM | POA: Diagnosis not present

## 2013-07-15 DIAGNOSIS — D239 Other benign neoplasm of skin, unspecified: Secondary | ICD-10-CM | POA: Diagnosis not present

## 2013-07-15 DIAGNOSIS — L738 Other specified follicular disorders: Secondary | ICD-10-CM | POA: Diagnosis not present

## 2013-07-15 DIAGNOSIS — L259 Unspecified contact dermatitis, unspecified cause: Secondary | ICD-10-CM | POA: Diagnosis not present

## 2013-07-16 ENCOUNTER — Ambulatory Visit: Payer: Medicare Other | Admitting: Neurology

## 2013-07-23 DIAGNOSIS — M545 Low back pain, unspecified: Secondary | ICD-10-CM | POA: Diagnosis not present

## 2013-07-23 DIAGNOSIS — IMO0002 Reserved for concepts with insufficient information to code with codable children: Secondary | ICD-10-CM | POA: Diagnosis not present

## 2013-07-23 DIAGNOSIS — M47817 Spondylosis without myelopathy or radiculopathy, lumbosacral region: Secondary | ICD-10-CM | POA: Diagnosis not present

## 2013-07-27 DIAGNOSIS — E291 Testicular hypofunction: Secondary | ICD-10-CM | POA: Diagnosis not present

## 2013-08-03 ENCOUNTER — Encounter: Payer: Self-pay | Admitting: Neurology

## 2013-08-03 ENCOUNTER — Ambulatory Visit: Payer: Medicare Other | Admitting: Neurology

## 2013-08-03 ENCOUNTER — Ambulatory Visit (INDEPENDENT_AMBULATORY_CARE_PROVIDER_SITE_OTHER): Payer: Medicare Other | Admitting: Neurology

## 2013-08-03 ENCOUNTER — Encounter (INDEPENDENT_AMBULATORY_CARE_PROVIDER_SITE_OTHER): Payer: Self-pay

## 2013-08-03 VITALS — BP 132/84 | HR 84 | Temp 98.7°F | Ht 70.0 in | Wt 206.0 lb

## 2013-08-03 DIAGNOSIS — G20 Parkinson's disease: Secondary | ICD-10-CM

## 2013-08-03 DIAGNOSIS — R413 Other amnesia: Secondary | ICD-10-CM | POA: Diagnosis not present

## 2013-08-03 DIAGNOSIS — G4700 Insomnia, unspecified: Secondary | ICD-10-CM | POA: Diagnosis not present

## 2013-08-03 NOTE — Patient Instructions (Addendum)
I think overall you are doing fairly well but I do want to suggest a few things today:  Remember to drink plenty of fluid, eat healthy meals and do not skip any meals. Try to eat protein with a every meal and eat a healthy snack such as fruit or nuts in between meals. Try to keep a regular sleep-wake schedule and try to exercise daily, particularly in the form of walking, 20-30 minutes a day, if you can.   Engage in social activities in your community and with your family and try to keep up with current events by reading the newspaper or watching the news.   As far as your medications are concerned, I would like to suggest no new medications.    As far as diagnostic testing: no new test today, I will refer you back to physical therapy.  I would like to see you back in 3 months, sooner if we need to. Please call us with any interim questions, concerns, problems, updates or refill requests.  Brett Canales is my clinical assistant and will answer any of your questions and relay your messages to me and also relay most of my messages to you.  Our phone number is [phone]. We also have an after hours call service for urgent matters and there is a physician on-call for urgent questions. For any emergencies you know to call 911 or go to the nearest emergency room.
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
## 2013-08-08 ENCOUNTER — Encounter: Payer: Self-pay | Admitting: Internal Medicine

## 2013-08-14 ENCOUNTER — Ambulatory Visit: Payer: Medicare Other | Admitting: *Deleted

## 2013-08-14 ENCOUNTER — Encounter: Payer: Self-pay | Admitting: *Deleted

## 2013-08-14 VITALS — BP 128/82 | HR 68 | Temp 98.2°F | Resp 16 | Ht 70.5 in | Wt 204.0 lb

## 2013-08-14 DIAGNOSIS — E291 Testicular hypofunction: Secondary | ICD-10-CM

## 2013-08-14 MED ORDER — TESTOSTERONE CYPIONATE 200 MG/ML IM SOLN
400.0000 mg | Freq: Once | INTRAMUSCULAR | Status: AC
  Administered 2013-08-14: 400 mg via INTRAMUSCULAR

## 2013-08-14 NOTE — Progress Notes (Signed)
Patient ID: [name], male   DOB: 08-06-1946, 67 y.o.   MRN: [id number] Testosterone Patient is here for testosterone injection. Patient is doing well on current dose with no complaints. Current outpatient prescriptions:amitriptyline (ELAVIL) 25 MG tablet, Take 1 tablet (25 mg total) by mouth at bedtime., Disp: 30 tablet, Rfl: 12;  aspirin 81 MG tablet, Take 81 mg by mouth daily.  , Disp: , Rfl: ;  Azelastine-Fluticasone (DYMISTA) 137-50 MCG/ACT SUSP, Place 2 sprays into the nose at bedtime., Disp: 1 Bottle, Rfl: 0;  bimatoprost (LUMIGAN) 0.01 % SOLN, Place 1 drop into both eyes at bedtime.  , Disp: , Rfl:  Cholecalciferol (VITAMIN D3) 10000 UNITS capsule, Take 10,000 Units by mouth. 3 times a week , Disp: , Rfl: ;  fexofenadine (ALLEGRA) 180 MG tablet, Take 180 mg by mouth daily.  , Disp: , Rfl: ;  fish oil-omega-3 fatty acids 1000 MG capsule, Take by mouth daily. 2400 mg on Tuesday, Thursday, and saturday, Disp: , Rfl: ;  levothyroxine (SYNTHROID, LEVOTHROID) 50 MCG tablet, , Disp: , Rfl:  losartan (COZAAR) 100 MG tablet, Take 100 mg by mouth daily., Disp: , Rfl: ;  magnesium gluconate (MAGONATE) 500 MG tablet, Take 500 mg by mouth as needed., Disp: , Rfl: ;  Multiple Vitamins-Minerals (MULTIVITAMIN WITH MINERALS) tablet, Take 1 tablet by mouth daily.  , Disp: , Rfl: ;  Naproxen Sodium (ALEVE PO), Take by mouth as needed., Disp: , Rfl: ;  pravastatin (PRAVACHOL) 20 MG tablet, Take 20 mg by mouth. M W F nights, Disp: , Rfl:  testosterone cypionate (DEPOTESTOTERONE CYPIONATE) 200 MG/ML injection, , Disp: , Rfl: ;  azelastine (ASTELIN) 137 MCG/SPRAY nasal spray, , Disp: , Rfl: ;  fluticasone (FLONASE) 50 MCG/ACT nasal spray, Place 2 sprays into the nose daily as needed. , Disp: , Rfl: ;  nebivolol (BYSTOLIC) 10 MG tablet, Take 10 mg by mouth daily.  , Disp: , Rfl:  Filed Vitals:   08/14/13 1103  BP: 128/82  Pulse: 68  Temp: 98.2 F (36.8 C)  Resp: 16   Assement and plan: Area was disinfected with  alcohol. A 3ml 21G 1 inch needle was used to inject 2 cc IM into left glute. Patient tolerated well.  Follow up in 18 days.

## 2013-08-17 ENCOUNTER — Ambulatory Visit: Payer: Medicare Other | Attending: Neurology | Admitting: Physical Therapy

## 2013-08-17 DIAGNOSIS — Z5189 Encounter for other specified aftercare: Secondary | ICD-10-CM | POA: Insufficient documentation

## 2013-08-17 DIAGNOSIS — G20 Parkinson's disease: Secondary | ICD-10-CM | POA: Diagnosis not present

## 2013-08-17 DIAGNOSIS — R4189 Other symptoms and signs involving cognitive functions and awareness: Secondary | ICD-10-CM | POA: Diagnosis not present

## 2013-08-17 DIAGNOSIS — R269 Unspecified abnormalities of gait and mobility: Secondary | ICD-10-CM | POA: Diagnosis not present

## 2013-08-17 DIAGNOSIS — R471 Dysarthria and anarthria: Secondary | ICD-10-CM | POA: Diagnosis not present

## 2013-08-17 DIAGNOSIS — R279 Unspecified lack of coordination: Secondary | ICD-10-CM | POA: Insufficient documentation

## 2013-08-17 DIAGNOSIS — M2420 Disorder of ligament, unspecified site: Secondary | ICD-10-CM | POA: Insufficient documentation

## 2013-08-17 DIAGNOSIS — R1310 Dysphagia, unspecified: Secondary | ICD-10-CM | POA: Insufficient documentation

## 2013-08-17 DIAGNOSIS — M629 Disorder of muscle, unspecified: Secondary | ICD-10-CM | POA: Insufficient documentation

## 2013-08-17 DIAGNOSIS — G20A1 Parkinson's disease without dyskinesia, without mention of fluctuations: Secondary | ICD-10-CM | POA: Insufficient documentation

## 2013-08-19 DIAGNOSIS — IMO0002 Reserved for concepts with insufficient information to code with codable children: Secondary | ICD-10-CM | POA: Diagnosis not present

## 2013-08-19 DIAGNOSIS — M545 Low back pain, unspecified: Secondary | ICD-10-CM | POA: Diagnosis not present

## 2013-08-19 DIAGNOSIS — M47817 Spondylosis without myelopathy or radiculopathy, lumbosacral region: Secondary | ICD-10-CM | POA: Diagnosis not present

## 2013-08-24 ENCOUNTER — Ambulatory Visit: Payer: Medicare Other | Admitting: Physical Therapy

## 2013-08-30 ENCOUNTER — Other Ambulatory Visit: Payer: Self-pay | Admitting: Internal Medicine

## 2013-08-30 DIAGNOSIS — Z0000 Encounter for general adult medical examination without abnormal findings: Secondary | ICD-10-CM | POA: Insufficient documentation

## 2013-08-30 DIAGNOSIS — E782 Mixed hyperlipidemia: Secondary | ICD-10-CM | POA: Insufficient documentation

## 2013-08-30 DIAGNOSIS — G20 Parkinson's disease: Secondary | ICD-10-CM | POA: Insufficient documentation

## 2013-08-30 DIAGNOSIS — N529 Male erectile dysfunction, unspecified: Secondary | ICD-10-CM | POA: Insufficient documentation

## 2013-08-30 DIAGNOSIS — R7309 Other abnormal glucose: Secondary | ICD-10-CM | POA: Insufficient documentation

## 2013-08-30 NOTE — Patient Instructions (Addendum)
Continue diet & medications same as discussed.   Further disposition pending lab results.     Hypertension As your heart beats, it forces blood through your arteries. This force is your blood pressure. If the pressure is too high, it is called hypertension (HTN) or high blood pressure. HTN is dangerous because you may have it and not know it. High blood pressure may mean that your heart has to work harder to pump blood. Your arteries may be narrow or stiff. The extra work puts you at risk for heart disease, stroke, and other problems.  Blood pressure consists of two numbers, a higher number over a lower, 110/72, for example. It is stated as "110 over 72." The ideal is below 120 for the top number (systolic) and under 80 for the bottom (diastolic). Write down your blood pressure today. You should pay close attention to your blood pressure if you have certain conditions such as:  Heart failure.  Prior heart attack.  Diabetes  Chronic kidney disease.  Prior stroke.  Multiple risk factors for heart disease. To see if you have HTN, your blood pressure should be measured while you are seated with your arm held at the level of the heart. It should be measured at least twice. A one-time elevated blood pressure reading (especially in the Emergency Department) does not mean that you need treatment. There may be conditions in which the blood pressure is different between your right and left arms. It is important to see your caregiver soon for a recheck. Most people have essential hypertension which means that there is not a specific cause. This type of high blood pressure may be lowered by changing lifestyle factors such as:  Stress.  Smoking.  Lack of exercise.  Excessive weight.  Drug/tobacco/alcohol use.  Eating less salt. Most people do not have symptoms from high blood pressure until it has caused damage to the body. Effective treatment can often prevent, delay or reduce that  damage. TREATMENT  When a cause has been identified, treatment for high blood pressure is directed at the cause. There are a large number of medications to treat HTN. These fall into several categories, and your caregiver will help you select the medicines that are best for you. Medications may have side effects. You should review side effects with your caregiver. If your blood pressure stays high after you have made lifestyle changes or started on medicines,   Your medication(s) may need to be changed.  Other problems may need to be addressed.  Be certain you understand your prescriptions, and know how and when to take your medicine.  Be sure to follow up with your caregiver within the time frame advised (usually within two weeks) to have your blood pressure rechecked and to review your medications.  If you are taking more than one medicine to lower your blood pressure, make sure you know how and at what times they should be taken. Taking two medicines at the same time can result in blood pressure that is too low. SEEK IMMEDIATE MEDICAL CARE IF:  You develop a severe headache, blurred or changing vision, or confusion.  You have unusual weakness or numbness, or a faint feeling.  You have severe chest or abdominal pain, vomiting, or breathing problems. MAKE SURE YOU:   Understand these instructions.  Will watch your condition.  Will get help right away if you are not doing well or get worse. Document Released: 09/24/2005 Document Revised: 12/17/2011 Document Reviewed: 05/14/2008 [employer] Patient Information  2014 [place], [place].   Parkinson Disease Parkinson disease is a disorder of the brain and spinal cord (central nervous system). The person will slowly lose the ability to control his or her body movements. This happens due to:  Damaged nerve cells.  Low levels of a certain brain chemical. HOME CARE  Exercise often.  Make time to rest during the day.  Take all medicine  as told by your doctor.  Replace buttons and zippers with elastic and Velcro if getting dressed is difficult.  Put grab bars or rails in your home. This helps you to not fall.  Go to speech therapy or therapy to help you with daily activities (occupational therapy). Do this as told by your doctor.  Keep all doctor visits as told. GET HELP IF:  Your medicine does not help your symptoms.  You fall.  You have trouble swallowing or choke on your food. MAKE SURE YOU:  Understand these instructions.  Will watch your condition.  Will get help right away if you are not doing well or get worse. Document Released: 12/17/2011 Document Revised: 01/19/2013 Document Reviewed: 12/17/2011 [facility] Patient Information 2014 [place], [place]. Parkinson Disease Parkinson disease is a disorder of the central nervous system, which includes the brain and spinal cord. A person with this disease slowly loses the ability to completely control body movements. Within the brain, there is a group of nerve cells (basal ganglia) that help control movement. The basal ganglia are damaged and do not work properly in a person with Parkinson disease. In addition, the basal ganglia produce and use a brain chemical called dopamine. The dopamine chemical sends messages to other parts of the body to control and coordinate body movements. Dopamine levels are low in a person with Parkinson disease. If the dopamine levels are low, then the body does not receive the correct messages it needs to move normally.  CAUSES  The exact reason why the basal ganglia get damaged is not known. Some medical researchers have thought that infection, genes, environment, and certain medicines may contribute to the cause.  SYMPTOMS   An early symptom of Parkinson disease is often an uncontrolled shaking (tremor) of the hands. The tremor will often disappear when the affected hand is consciously used.  As the disease progresses, walking, talking,  getting out of a chair, and new movements become more difficult.  Muscles get stiff and movements become slower.  Balance and coordination become harder.  Depression, trouble swallowing, urinary problems, constipation, and sleep problems can occur.  Later in the disease, memory and thought processes may deteriorate. DIAGNOSIS  There are no specific tests to diagnose Parkinson disease. You may be referred to a neurologist for evaluation. Your caregiver will ask about your medical history, symptoms, and perform a physical exam. Blood tests and imaging tests of your brain may be performed to rule out other diseases. The imaging tests may include an MRI or a CT scan. TREATMENT  The goal of treatment is to relieve symptoms. Medicines may be prescribed once the symptoms become troublesome. Medicine will not stop the progression of the disease, but medicine can make movement and balance better and help control tremors. Speech and occupational therapy may also be prescribed. Sometimes, surgical treatment of the brain can be done in young people. HOME CARE INSTRUCTIONS  Get regular exercise and rest periods during the day to help prevent exhaustion and depression.  If getting dressed becomes difficult, replace buttons and zippers with Velcro and elastic on your clothing.  Take all medicine as directed by your caregiver.  Install grab bars or railings in your home to prevent falls.  Go to speech or occupational therapy as directed.  Keep all follow-up visits as directed by your caregiver. SEEK MEDICAL CARE IF:  Your symptoms are not controlled with your medicine.  You fall.  You have trouble swallowing or choke on your food. MAKE SURE YOU:  Understand these instructions.  Will watch your condition.  Will get help right away if you are not doing well or get worse. Document Released: 09/21/2000 Document Revised: 01/19/2013 Document Reviewed: 10/24/2011 [facility] Patient Information  2014 [place], [place].    Diabetes and Exercise Exercising regularly is important. It is not just about losing weight. It has many health benefits, such as:  Improving your overall fitness, flexibility, and endurance.  Increasing your bone density.  Helping with weight control.  Decreasing your body fat.  Increasing your muscle strength.  Reducing stress and tension.  Improving your overall health. People with diabetes who exercise gain additional benefits because exercise:  Reduces appetite.  Improves the body's use of blood sugar (glucose).  Helps lower or control blood glucose.  Decreases blood pressure.  Helps control blood lipids (such as cholesterol and triglycerides).  Improves the body's use of the hormone insulin by:  Increasing the body's insulin sensitivity.  Reducing the body's insulin needs.  Decreases the risk for heart disease because exercising:  Lowers cholesterol and triglycerides levels.  Increases the levels of good cholesterol (such as high-density lipoproteins [HDL]) in the body.  Lowers blood glucose levels. YOUR ACTIVITY PLAN  Choose an activity that you enjoy and set realistic goals. Your health care provider or diabetes educator can help you make an activity plan that works for you. You can break activities into 2 or 3 sessions throughout the day. Doing so is as good as one long session. Exercise ideas include:  Taking the dog for a walk.  Taking the stairs instead of the elevator.  Dancing to your favorite song.  Doing your favorite exercise with a friend. RECOMMENDATIONS FOR EXERCISING WITH TYPE 1 OR TYPE 2 DIABETES   Check your blood glucose before exercising. If blood glucose levels are greater than 240 mg/dL, check for urine ketones. Do not exercise if ketones are present.  Avoid injecting insulin into areas of the body that are going to be exercised. For example, avoid injecting insulin into:  The arms when playing  tennis.  The legs when jogging.  Keep a record of:  Food intake before and after you exercise.  Expected peak times of insulin action.  Blood glucose levels before and after you exercise.  The type and amount of exercise you have done.  Review your records with your health care provider. Your health care provider will help you to develop guidelines for adjusting food intake and insulin amounts before and after exercising.  If you take insulin or oral hypoglycemic agents, watch for signs and symptoms of hypoglycemia. They include:  Dizziness.  Shaking.  Sweating.  Chills.  Confusion.  Drink plenty of water while you exercise to prevent dehydration or heat stroke. Body water is lost during exercise and must be replaced.  Talk to your health care provider before starting an exercise program to make sure it is safe for you. Remember, almost any type of activity is better than none. Document Released: 12/15/2003 Document Revised: 05/27/2013 Document Reviewed: 03/03/2013 [facility] Patient Information 2014 [place], [place].   Cholesterol Cholesterol is a white, waxy,  fat-like protein needed by your body in small amounts. The liver makes all the cholesterol you need. It is carried from the liver by the blood through the blood vessels. Deposits (plaque) may build up on blood vessel walls. This makes the arteries narrower and stiffer. Plaque increases the risk for heart attack and stroke. You cannot feel your cholesterol level even if it is very high. The only way to know is by a blood test to check your lipid (fats) levels. Once you know your cholesterol levels, you should keep a record of the test results. Work with your caregiver to to keep your levels in the desired range. WHAT THE RESULTS MEAN:  Total cholesterol is a rough measure of all the cholesterol in your blood.  LDL is the so-called bad cholesterol. This is the type that deposits cholesterol in the walls of the arteries.  You want this level to be low.  HDL is the good cholesterol because it cleans the arteries and carries the LDL away. You want this level to be high.  Triglycerides are fat that the body can either burn for energy or store. High levels are closely linked to heart disease. DESIRED LEVELS:  Total cholesterol below 200.  LDL below 100 for people at risk, below 70 for very high risk.  HDL above 50 is good, above 60 is best.  Triglycerides below 150. HOW TO LOWER YOUR CHOLESTEROL:  Diet.  Choose fish or white meat chicken and [place], roasted or baked. Limit fatty cuts of red meat, fried foods, and processed meats, such as sausage and lunch meat.  Eat lots of fresh fruits and vegetables. Choose whole grains, beans, pasta, potatoes and cereals.  Use only small amounts of olive, corn or canola oils. Avoid butter, mayonnaise, shortening or palm kernel oils. Avoid foods with trans-fats.  Use skim/nonfat milk and low-fat/nonfat yogurt and cheeses. Avoid whole milk, cream, ice cream, egg yolks and cheeses. Healthy desserts include angel food cake, ginger snaps, animal crackers, hard candy, popsicles, and low-fat/nonfat frozen yogurt. Avoid pastries, cakes, pies and cookies.  Exercise.  A regular program helps decrease LDL and raises HDL.  Helps with weight control.  Do things that increase your activity level like gardening, walking, or taking the stairs.  Medication.  May be prescribed by your caregiver to help lowering cholesterol and the risk for heart disease.  You may need medicine even if your levels are normal if you have several risk factors. HOME CARE INSTRUCTIONS   Follow your diet and exercise programs as suggested by your caregiver.  Take medications as directed.  Have blood work done when your caregiver feels it is necessary. MAKE SURE YOU:   Understand these instructions.  Will watch your condition.  Will get help right away if you are not doing well or get  worse. Document Released: 06/19/2001 Document Revised: 12/17/2011 Document Reviewed: 12/10/2007 [facility] Patient Information 2014 [place], [place].   Vitamin D Deficiency Vitamin D is an important vitamin that your body needs. Having too little of it in your body is called a deficiency. A very bad deficiency can make your bones soft and can cause a condition called rickets.  Vitamin D is important to your body for different reasons, such as:   It helps your body absorb 2 minerals called calcium and phosphorus.  It helps make your bones healthy.  It may prevent some diseases, such as diabetes and multiple sclerosis.  It helps your muscles and heart. You can get vitamin D in  several ways. It is a natural part of some foods. The vitamin is also added to some dairy products and cereals. Some people take vitamin D supplements. Also, your body makes vitamin D when you are in the sun. It changes the sun's rays into a form of the vitamin that your body can use. CAUSES   Not eating enough foods that contain vitamin D.  Not getting enough sunlight.  Having certain digestive system diseases that make it hard to absorb vitamin D. These diseases include Crohn's disease, chronic pancreatitis, and cystic fibrosis.  Having a surgery in which part of the stomach or small intestine is removed.  Being obese. Fat cells pull vitamin D out of your blood. That means that obese people may not have enough vitamin D left in their blood and in other body tissues.  Having chronic kidney or liver disease. RISK FACTORS Risk factors are things that make you more likely to develop a vitamin D deficiency. They include:  Being older.  Not being able to get outside very much.  Living in a nursing home.  Having had broken bones.  Having weak or thin bones (osteoporosis).  Having a disease or condition that changes how your body absorbs vitamin D.  Having dark skin.  Some medicines such as seizure medicines  or steroids.  Being overweight or obese. SYMPTOMS Mild cases of vitamin D deficiency may not have any symptoms. If you have a very bad case, symptoms may include:  Bone pain.  Muscle pain.  Falling often.  Broken bones caused by a minor injury, due to osteoporosis. DIAGNOSIS A blood test is the best way to tell if you have a vitamin D deficiency. TREATMENT Vitamin D deficiency can be treated in different ways. Treatment for vitamin D deficiency depends on what is causing it. Options include:  Taking vitamin D supplements.  Taking a calcium supplement. Your caregiver will suggest what dose is best for you. HOME CARE INSTRUCTIONS  Take any supplements that your caregiver prescribes. Follow the directions carefully. Take only the suggested amount.  Have your blood tested 2 months after you start taking supplements.  Eat foods that contain vitamin D. Healthy choices include:  Fortified dairy products, cereals, or juices. Fortified means vitamin D has been added to the food. Check the label on the package to be sure.  Fatty fish like salmon or trout.  Eggs.  Oysters.  Do not use a tanning bed.  Keep your weight at a healthy level. Lose weight if you need to.  Keep all follow-up appointments. Your caregiver will need to perform blood tests to make sure your vitamin D deficiency is going away. SEEK MEDICAL CARE IF:  You have any questions about your treatment.  You continue to have symptoms of vitamin D deficiency.  You have nausea or vomiting.  You are constipated.  You feel confused.  You have severe abdominal or back pain. MAKE SURE YOU:  Understand these instructions.  Will watch your condition.  Will get help right away if you are not doing well or get worse. Document Released: 12/17/2011 Document Revised: 01/19/2013 Document Reviewed: 12/17/2011 [facility] Patient Information 2014 [name], [place]. Testosterone injection What is this  medicine? TESTOSTERONE (tes TOS ter one) is the main male hormone. It supports normal male development such as muscle growth, facial hair, and deep voice. It is used in males to treat low testosterone levels. This medicine may be used for other purposes; ask your health care provider or pharmacist if you  have questions. COMMON BRAND NAME(S): Andro-L.A., Aveed, Delatestryl, Depo-Testosterone, Virilon What should I tell my health care provider before I take this medicine? They need to know if you have any of these conditions: -breast cancer -diabetes -heart disease -kidney disease -liver disease -lung disease -prostate cancer, enlargement -an unusual or allergic reaction to testosterone, other medicines, foods, dyes, or preservatives -pregnant or trying to get pregnant -breast-feeding How should I use this medicine? This medicine is for injection into a muscle. It is usually given by a health care professional in a hospital or clinic setting. Contact your pediatrician regarding the use of this medicine in children. While this medicine may be prescribed for children as young as 90 years of age for selected conditions, precautions do apply. Overdosage: If you think you have taken too much of this medicine contact a poison control center or emergency room at once. NOTE: This medicine is only for you. Do not share this medicine with others. What if I miss a dose? Try not to miss a dose. Your doctor or health care professional will tell you when your next injection is due. Notify the office if you are unable to keep an appointment. What may interact with this medicine? -medicines for diabetes -medicines that treat or prevent blood clots like warfarin -oxyphenbutazone -propranolol -steroid medicines like prednisone or cortisone This list may not describe all possible interactions. Give your health care provider a list of all the medicines, herbs, non-prescription drugs, or dietary supplements  you use. Also tell them if you smoke, drink alcohol, or use illegal drugs. Some items may interact with your medicine. What should I watch for while using this medicine? Visit your doctor or health care professional for regular checks on your progress. They will need to check the level of testosterone in your blood. This medicine may affect blood sugar levels. If you have diabetes, check with your doctor or health care professional before you change your diet or the dose of your diabetic medicine. This drug is banned from use in athletes by most athletic organizations. What side effects may I notice from receiving this medicine? Side effects that you should report to your doctor or health care professional as soon as possible: -allergic reactions like skin rash, itching or hives, swelling of the face, lips, or tongue -breast enlargement -breathing problems -changes in mood, especially anger, depression, or rage -dark urine -general ill feeling or flu-like symptoms -light-colored stools -loss of appetite, nausea -nausea, vomiting -right upper belly pain -stomach pain -swelling of ankles -too frequent or persistent erections -trouble passing urine or change in the amount of urine -unusually weak or tired -yellowing of the eyes or skin Additional side effects that can occur in women include: -deep or hoarse voice -facial hair growth -irregular menstrual periods Side effects that usually do not require medical attention (report to your doctor or health care professional if they continue or are bothersome): -acne -change in sex drive or performance -hair loss -headache This list may not describe all possible side effects. Call your doctor for medical advice about side effects. You may report side effects to FDA at 1-800-FDA-1088. Where should I keep my medicine? Keep out of the reach of children. This medicine can be abused. Keep your medicine in a safe place to protect it from theft. Do  not share this medicine with anyone. Selling or giving away this medicine is dangerous and against the law. Store at room temperature between 20 and 25 degrees C (68 and 77 degrees  F). Do not freeze. Protect from light. Follow the directions for the product you are prescribed. Throw away any unused medicine after the expiration date. NOTE: This sheet is a summary. It may not cover all possible information. If you have questions about this medicine, talk to your doctor, pharmacist, or health care provider.  2014, [employer]. (2007-12-05 16:13:46)

## 2013-08-30 NOTE — Progress Notes (Signed)
Patient ID: [name], male   DOB: Aug 31, 1946, 67 y.o.   MRN: [id number]  Annual Screening Comprehensive Examination  This very nice 67 yo MWM who  presents for complete physical.  Patient has been followed for HTN,  Hyperlipidemia, Prediabetes,Testosterone Deficiency, Parkinson's disease and vitamin D Deficiency.   Patient's BP has been controlled at home. Patient denies any cardiac Symptoms as chest pain, palpitations, shortness of breath, dizziness or ankle swelling. BP's are monitored her when patient comes for his q2weekly testosterone injections. Patient has been on replacement therapy for years. In addition , patient has compensated hypothyroidism.   Patient's hyperlipidemia is controlled with diet, supplements and medications. Patient denies myalgias or other medication SE's. Last cholesterol last visit was 123, sl. elev. triglycerides at 180 and LDL 69 at goal.     Patient has history if abnormal glucose with last A1c 5.5% and sl. Elevated average glucose 111 mg%. Patient denies reactive hypoglycemic symptoms, visual blurring, diabetic polys, or paresthesias.    Other significant limiting problem is Parkinson's Disease for which he is currently being followed by Dr Derald Macleod. Patient has been intolerant of even low dose of parkinson's medications. He has had problems with gait & falling, difficulty with speech and reported difficulty with insight and judgement.   Also, patient has ben on lon term testosterone replacement therapy with last level 185 low in September and inter dosing level was decreased from q3wk.s to q29d.   Finally, patient has history of Vitamin D Deficiency with last vitamin D 75 in September.     Current Outpatient Prescriptions on File Prior to Visit  Medication Sig Dispense Refill  . amitriptyline (ELAVIL) 25 MG tablet Take 1 tablet (25 mg total) by mouth at bedtime.  30 tablet  12  . aspirin 81 MG tablet Take 81 mg by mouth daily.        Marland Kitchen azelastine (ASTELIN)  137 MCG/SPRAY nasal spray       . Azelastine-Fluticasone (DYMISTA) 137-50 MCG/ACT SUSP Place 2 sprays into the nose at bedtime.  1 Bottle  0  . bimatoprost (LUMIGAN) 0.01 % SOLN Place 1 drop into both eyes at bedtime.        . Cholecalciferol (VITAMIN D3) 10000 UNITS capsule Take 10,000 Units by mouth. 3 times a week       . fexofenadine (ALLEGRA) 180 MG tablet Take 180 mg by mouth daily.        . fish oil-omega-3 fatty acids 1000 MG capsule Take by mouth daily. 2400 mg on Tuesday, Thursday, and saturday      . fluticasone (FLONASE) 50 MCG/ACT nasal spray USE 1-2 SPRAYS IN EACH NOSTRIL EVERY DAY  16 g  6  . levothyroxine (SYNTHROID, LEVOTHROID) 50 MCG tablet TAKE 1 TABLET EVERY DAY  90 tablet  4  . losartan (COZAAR) 100 MG tablet Take 100 mg by mouth daily.      . magnesium gluconate (MAGONATE) 500 MG tablet Take 500 mg by mouth as needed.      . Multiple Vitamins-Minerals (MULTIVITAMIN WITH MINERALS) tablet Take 1 tablet by mouth daily.        . Naproxen Sodium (ALEVE PO) Take by mouth as needed.      . testosterone cypionate (DEPOTESTOTERONE CYPIONATE) 200 MG/ML injection       . nebivolol (BYSTOLIC) 10 MG tablet Take 10 mg by mouth daily.        . pravastatin (PRAVACHOL) 20 MG tablet Take 20 mg by mouth. M W F nights  No current facility-administered medications on file prior to visit.    Allergies  Allergen Reactions  . Ambien [Zolpidem Tartrate]   . Atenolol   . Lunesta [Eszopiclone]   . Oxazepam   . Requip [Ropinirole Hcl]   . Ziac [Bisoprolol-Hydrochlorothiazide]     Past Medical History  Diagnosis Date  . HTN (hypertension)   . Glaucoma   . OSA (obstructive sleep apnea)   . Allergic rhinitis   . Parkinsonism 01/16/2013  . Hyperlipidemia   . Thyroid disease     HYPOTHYROID  . Hypogonadism male   . Pre-diabetes     Past Surgical History  Procedure Laterality Date  . Uvulopalatopharyngoplasty    . Lumbar disc surgery    . Cataract extraction      Family  History  Problem Relation Age of Onset  . Emphysema Mother   . COPD Mother   . Heart disease Mother   . Liver cancer Father   . Cancer Father   . Diabetes Father   . Heart disease Sister   . COPD Sister     History   Social History  . Marital Status: Married    Spouse Name: N/A    Number of Children: N/A  . Years of Education: N/A   Occupational History  . branch operation manager    Social History Main Topics  . Smoking status: Never Smoker   . Smokeless tobacco: Never Used  . Alcohol Use: Yes  . Drug Use: No  . Sexual Activity: Not on file   Other Topics Concern  . Not on file   Social History Narrative  . No narrative on file    ROS Constitutional: Denies fever, chills, weight loss/gain, headaches, insomnia, fatigue, night sweats, and change in appetite. Eyes: Denies redness, blurred vision, diplopia, discharge, itchy, watery eyes.  ENT: Denies discharge, congestion, post nasal drip, epistaxis, sore throat, earache, hearing loss, dental pain, Tinnitus, Vertigo, Sinus pain, snoring.  Cardio: Denies chest pain, palpitations, irregular heartbeat, syncope, dyspnea, diaphoresis, orthopnea, PND, claudication, edema Respiratory: denies cough, dyspnea, DOE, pleurisy, hoarseness, laryngitis, wheezing.  Gastrointestinal: Denies dysphagia, heartburn, reflux, water brash, pain, cramps, nausea, vomiting, bloating, diarrhea, constipation, hematemesis, melena, hematochezia, jaundice, hemorrhoids Genitourinary: Denies dysuria, frequency, urgency, nocturia, hesitancy, discharge, hematuria, flank pain Musculoskeletal: Denies arthralgia, myalgia, stiffness, Jt. Swelling, pain, limp, and strain/sprain. Skin: Denies puritis, rash, hives, warts, acne, eczema, changing in skin lesion Neuro: Weakness, tremor, incoordination, spasms, paresthesia, pain Psychiatric: Denies confusion, memory loss, sensory loss Endocrine: Denies change in weight, skin, hair change, nocturia, and paresthesia,  diabetic polys, visual blurring, hyper /hypo glycemic episodes.  Heme/Lymph: No excessive bleeding, bruising, enlarged lymph nodes.  Filed Vitals:   08/31/13 0933  BP: 134/90  Pulse: 80  Temp: 98.2 F (36.8 C)  Resp: 16    Estimated body mass index is 29.05 kg/(m^2) as calculated from the following:   Height as of this encounter: 5' 10.5" (1.791 m).   Weight as of this encounter: 205 lb 6.4 oz (93.169 kg).  Physical Exam General Appearance: Well nourished, in no apparent distress. Eyes: PERRLA, EOMs, conjunctiva no swelling or erythema, normal fundi and vessels. Sinuses: No frontal/maxillary tenderness ENT/Mouth: EACs patent / TMs  nl. Nares clear without erythema, swelling, mucoid exudates. Oral hygiene is good. No erythema, swelling, or exudate. Tongue normal, non-obstructing. Tonsils not swollen or erythematous. Hearing normal.  Neck: Supple, thyroid normal. No bruits, nodes or JVD. Respiratory: Respiratory effort normal.  BS equal and clear bilateral without rales, rhonci, wheezing  or stridor. Cardio: Heart sounds are normal with regular rate and rhythm and no murmurs, rubs or gallops. Peripheral pulses are normal and equal bilaterally without edema. No aortic or femoral bruits. Chest: symmetric with normal excursions and percussion.  Abdomen: Flat, soft, with bowl sounds. Nontender, no guarding, rebound, hernias, masses, or organomegaly.  Lymphatics: Non tender without lymphadenopathy.  Genitourinary: No hernias.Testes nl. DRE - prostate nl for age - smooth & firm w/o nodules. Musculoskeletal: Full ROM all peripheral extremities, very slight cogwheeling of UEs bilaterally. joint stability, 5/5 strength, and gait with sl decreased stride. Skin: Warm and dry without rashes, lesions, cyanosis, clubbing or  ecchymosis.  Neuro: Slight masked facies with decreased blink. Speech slightly dysarthric. Cranial nerves intact, reflexes equal bilaterally. Slight  Increased muscle tone with  no  cerebellar symptoms. Sensation intact.  Pysch: Awake and oriented X 3, normal affect, Insight and Judgment appropriate.   Assessment and Plan  1. Annual Screening Examination 2. Hypertension  3. Hyperlipidemia 4. Pre Diabetes 5. Vitamin D Deficiency 6. Hypothyroidism 7. Hypogonadism 8. Parkinson's Disease  Continue prudent diet as discussed, weight control, regular exercise, and medications. Routine screening labs and tests as requested with regular follow-up as recommended.

## 2013-08-31 ENCOUNTER — Other Ambulatory Visit: Payer: Self-pay | Admitting: Internal Medicine

## 2013-08-31 ENCOUNTER — Ambulatory Visit: Payer: Medicare Other | Admitting: Physical Therapy

## 2013-08-31 ENCOUNTER — Ambulatory Visit: Payer: Medicare Other | Admitting: Internal Medicine

## 2013-08-31 ENCOUNTER — Encounter: Payer: Self-pay | Admitting: Internal Medicine

## 2013-08-31 VITALS — BP 134/90 | HR 80 | Temp 98.2°F | Resp 16 | Ht 70.5 in | Wt 205.4 lb

## 2013-08-31 DIAGNOSIS — E291 Testicular hypofunction: Secondary | ICD-10-CM

## 2013-08-31 DIAGNOSIS — I10 Essential (primary) hypertension: Secondary | ICD-10-CM | POA: Diagnosis not present

## 2013-08-31 DIAGNOSIS — E559 Vitamin D deficiency, unspecified: Secondary | ICD-10-CM

## 2013-08-31 DIAGNOSIS — Z0000 Encounter for general adult medical examination without abnormal findings: Secondary | ICD-10-CM

## 2013-08-31 DIAGNOSIS — Z79899 Other long term (current) drug therapy: Secondary | ICD-10-CM

## 2013-08-31 DIAGNOSIS — E782 Mixed hyperlipidemia: Secondary | ICD-10-CM | POA: Diagnosis not present

## 2013-08-31 DIAGNOSIS — Z1212 Encounter for screening for malignant neoplasm of rectum: Secondary | ICD-10-CM | POA: Diagnosis not present

## 2013-08-31 DIAGNOSIS — E039 Hypothyroidism, unspecified: Secondary | ICD-10-CM | POA: Diagnosis not present

## 2013-08-31 DIAGNOSIS — R7309 Other abnormal glucose: Secondary | ICD-10-CM

## 2013-08-31 DIAGNOSIS — G20 Parkinson's disease: Secondary | ICD-10-CM

## 2013-08-31 DIAGNOSIS — Z125 Encounter for screening for malignant neoplasm of prostate: Secondary | ICD-10-CM

## 2013-08-31 DIAGNOSIS — N529 Male erectile dysfunction, unspecified: Secondary | ICD-10-CM

## 2013-08-31 LAB — CBC WITH DIFFERENTIAL/PLATELET
Basophils Absolute: 0 10*3/uL (ref 0.0–0.1)
Lymphocytes Relative: 20 % (ref 12–46)
Neutro Abs: 4.4 10*3/uL (ref 1.7–7.7)
Neutrophils Relative %: 65 % (ref 43–77)
Platelets: 228 10*3/uL (ref 150–400)
RDW: 15 % (ref 11.5–15.5)
WBC: 6.8 10*3/uL (ref 4.0–10.5)

## 2013-08-31 LAB — TSH: TSH: 2.284 u[IU]/mL (ref 0.350–4.500)

## 2013-08-31 LAB — HEPATIC FUNCTION PANEL
ALT: 34 U/L (ref 0–53)
AST: 32 U/L (ref 0–37)
Alkaline Phosphatase: 57 U/L (ref 39–117)
Bilirubin, Direct: 0.1 mg/dL (ref 0.0–0.3)
Indirect Bilirubin: 0.5 mg/dL (ref 0.0–0.9)
Total Bilirubin: 0.6 mg/dL (ref 0.3–1.2)

## 2013-08-31 LAB — BASIC METABOLIC PANEL WITH GFR
Chloride: 100 mEq/L (ref 96–112)
GFR, Est African American: 81 mL/min
GFR, Est Non African American: 70 mL/min
Potassium: 4.5 mEq/L (ref 3.5–5.3)

## 2013-08-31 LAB — LIPID PANEL
HDL: 45 mg/dL (ref >39)
LDL Cholesterol: 100 mg/dL — ABNORMAL HIGH (ref 0–99)
Total CHOL/HDL Ratio: 3.8 Ratio
Triglycerides: 135 mg/dL (ref <150)
VLDL: 27 mg/dL (ref 0–40)

## 2013-08-31 LAB — HEMOGLOBIN A1C: Hgb A1c MFr Bld: 5.6 % (ref <5.7)

## 2013-08-31 MED ORDER — TESTOSTERONE CYPIONATE 200 MG/ML IM SOLN
400.0000 mg | Freq: Once | INTRAMUSCULAR | Status: AC
  Administered 2013-08-31: 400 mg via INTRAMUSCULAR

## 2013-09-01 LAB — URINALYSIS, MICROSCOPIC ONLY
Bacteria, UA: NONE SEEN
Casts: NONE SEEN
Crystals: NONE SEEN
Squamous Epithelial / HPF: NONE SEEN

## 2013-09-01 LAB — TESTOSTERONE: Testosterone: 310 ng/dL (ref 300–890)

## 2013-09-01 LAB — MICROALBUMIN / CREATININE URINE RATIO
Creatinine, Urine: 130.6 mg/dL
Microalb Creat Ratio: 3.8 mg/g (ref 0.0–30.0)
Microalb, Ur: 0.5 mg/dL (ref 0.00–1.89)

## 2013-09-07 ENCOUNTER — Telehealth: Payer: Self-pay | Admitting: Neurology

## 2013-09-07 ENCOUNTER — Ambulatory Visit: Payer: Medicare Other | Attending: Neurology | Admitting: Physical Therapy

## 2013-09-07 DIAGNOSIS — G20A1 Parkinson's disease without dyskinesia, without mention of fluctuations: Secondary | ICD-10-CM | POA: Insufficient documentation

## 2013-09-07 DIAGNOSIS — R269 Unspecified abnormalities of gait and mobility: Secondary | ICD-10-CM | POA: Insufficient documentation

## 2013-09-07 DIAGNOSIS — M629 Disorder of muscle, unspecified: Secondary | ICD-10-CM | POA: Insufficient documentation

## 2013-09-07 DIAGNOSIS — R279 Unspecified lack of coordination: Secondary | ICD-10-CM | POA: Diagnosis not present

## 2013-09-07 DIAGNOSIS — R471 Dysarthria and anarthria: Secondary | ICD-10-CM | POA: Diagnosis not present

## 2013-09-07 DIAGNOSIS — R1310 Dysphagia, unspecified: Secondary | ICD-10-CM | POA: Insufficient documentation

## 2013-09-07 DIAGNOSIS — R4189 Other symptoms and signs involving cognitive functions and awareness: Secondary | ICD-10-CM | POA: Diagnosis not present

## 2013-09-07 DIAGNOSIS — Z5189 Encounter for other specified aftercare: Secondary | ICD-10-CM | POA: Insufficient documentation

## 2013-09-07 DIAGNOSIS — M2420 Disorder of ligament, unspecified site: Secondary | ICD-10-CM | POA: Insufficient documentation

## 2013-09-07 DIAGNOSIS — G20 Parkinson's disease: Secondary | ICD-10-CM | POA: Insufficient documentation

## 2013-09-07 NOTE — Telephone Encounter (Signed)
Spoke with patient and he wanted to make doctor aware that he had stop the medication

## 2013-09-08 ENCOUNTER — Other Ambulatory Visit: Payer: Self-pay | Admitting: Internal Medicine

## 2013-09-08 DIAGNOSIS — H409 Unspecified glaucoma: Secondary | ICD-10-CM | POA: Diagnosis not present

## 2013-09-08 DIAGNOSIS — Z961 Presence of intraocular lens: Secondary | ICD-10-CM | POA: Diagnosis not present

## 2013-09-08 DIAGNOSIS — H4011X0 Primary open-angle glaucoma, stage unspecified: Secondary | ICD-10-CM | POA: Diagnosis not present

## 2013-09-14 ENCOUNTER — Ambulatory Visit: Payer: Medicare Other | Admitting: Physical Therapy

## 2013-09-15 DIAGNOSIS — Z0289 Encounter for other administrative examinations: Secondary | ICD-10-CM

## 2013-09-18 ENCOUNTER — Ambulatory Visit (INDEPENDENT_AMBULATORY_CARE_PROVIDER_SITE_OTHER): Payer: Medicare Other

## 2013-09-18 VITALS — BP 138/78 | HR 72 | Temp 98.1°F | Resp 16 | Ht 70.5 in | Wt 209.0 lb

## 2013-09-18 DIAGNOSIS — E291 Testicular hypofunction: Secondary | ICD-10-CM | POA: Diagnosis not present

## 2013-09-18 MED ORDER — TESTOSTERONE CYPIONATE 200 MG/ML IM SOLN
400.0000 mg | Freq: Once | INTRAMUSCULAR | Status: AC
  Administered 2013-09-18: 400 mg via INTRAMUSCULAR

## 2013-09-18 NOTE — Progress Notes (Signed)
Patient ID: [name], male   DOB: [birth year], 67 y.o.   MRN: [id number] Patient here today for Testosterone injection, 2 cc IM Left glut.

## 2013-10-09 ENCOUNTER — Ambulatory Visit: Payer: Self-pay

## 2013-10-12 ENCOUNTER — Telehealth: Payer: Self-pay | Admitting: Neurology

## 2013-10-12 NOTE — Telephone Encounter (Signed)
I will mail out copy of 08/03/2013 OV note to patient's address.

## 2013-10-12 NOTE — Telephone Encounter (Signed)
Patient's wife calling to state that only partial paperwork was sent, says that only the encounter for April was received and they need the encounter for October's visit. Please call the patient's wife.

## 2013-10-13 ENCOUNTER — Encounter: Payer: Self-pay | Admitting: Emergency Medicine

## 2013-10-13 ENCOUNTER — Ambulatory Visit (INDEPENDENT_AMBULATORY_CARE_PROVIDER_SITE_OTHER): Payer: Medicare Other | Admitting: Emergency Medicine

## 2013-10-13 VITALS — BP 130/82 | HR 78 | Temp 98.0°F | Resp 18 | Ht 70.5 in | Wt 205.0 lb

## 2013-10-13 DIAGNOSIS — J329 Chronic sinusitis, unspecified: Secondary | ICD-10-CM | POA: Diagnosis not present

## 2013-10-13 DIAGNOSIS — E291 Testicular hypofunction: Secondary | ICD-10-CM

## 2013-10-13 DIAGNOSIS — J309 Allergic rhinitis, unspecified: Secondary | ICD-10-CM

## 2013-10-13 MED ORDER — TESTOSTERONE CYPIONATE 200 MG/ML IM SOLN
400.0000 mg | Freq: Once | INTRAMUSCULAR | Status: AC
  Administered 2013-10-13: 400 mg via INTRAMUSCULAR

## 2013-10-13 MED ORDER — AZELASTINE HCL 0.1 % NA SOLN: 2.0000 | Freq: Two times a day (BID) | NASAL | Status: DC

## 2013-10-13 MED ORDER — AZITHROMYCIN 250 MG PO TABS: ORAL_TABLET | ORAL | Status: AC

## 2013-10-13 MED ORDER — PREDNISONE 10 MG PO TABS: ORAL_TABLET | ORAL | Status: DC

## 2013-10-13 NOTE — Progress Notes (Signed)
Subjective:    Patient ID: [name], male    DOB: December 30, 1945, 68 y.o.   MRN: [id number]  HPI Comments: 68 yo male with Parkinson's presents with + drainage increased clear to white from nose, throat dry, dry cough. Always tired with Parkinson's but mild increase over last week or 2 with head congestion symptoms. He notes symptoms were improved with Dymista but cannot afford without insurance coverage. He is using Flonase currently.   He notes mild increase with stumbling with initiating gait, difficulty with writing, and multitasking with Parkinsons. He notes f/u is in 3 month with Neuro. He tries to stay active and eat healthy.  Cough Associated symptoms include postnasal drip and a sore throat.   Current Outpatient Prescriptions on File Prior to Visit  Medication Sig Dispense Refill  . amitriptyline (ELAVIL) 25 MG tablet Take 1 tablet (25 mg total) by mouth at bedtime.  30 tablet  12  . aspirin 81 MG tablet Take 81 mg by mouth daily.        . Azelastine-Fluticasone (DYMISTA) 137-50 MCG/ACT SUSP Place 2 sprays into the nose at bedtime.  1 Bottle  0  . bimatoprost (LUMIGAN) 0.01 % SOLN Place 1 drop into both eyes at bedtime.        . Cholecalciferol (VITAMIN D3) 10000 UNITS capsule Take 10,000 Units by mouth. 3 times a week       . fexofenadine (ALLEGRA) 180 MG tablet Take 180 mg by mouth daily.        . fish oil-omega-3 fatty acids 1000 MG capsule Take by mouth daily. 2400 mg on Tuesday, Thursday, and saturday      . fluticasone (FLONASE) 50 MCG/ACT nasal spray USE 1-2 SPRAYS IN EACH NOSTRIL EVERY DAY  16 g  6  . levothyroxine (SYNTHROID, LEVOTHROID) 50 MCG tablet TAKE 1 TABLET EVERY DAY  90 tablet  1  . losartan (COZAAR) 100 MG tablet Take 100 mg by mouth daily.      . magnesium gluconate (MAGONATE) 500 MG tablet Take 500 mg by mouth as needed.      . Multiple Vitamins-Minerals (MULTIVITAMIN WITH MINERALS) tablet Take 1 tablet by mouth daily.        . Naproxen Sodium (ALEVE PO) Take  by mouth as needed.       No current facility-administered medications on file prior to visit.   ALLERGIES Ambien; Atenolol; Lunesta; Oxazepam; Requip; and Ziac  Past Medical History  Diagnosis Date  . HTN (hypertension)   . Glaucoma   . OSA (obstructive sleep apnea)   . Allergic rhinitis   . Parkinsonism 01/16/2013  . Hyperlipidemia   . Thyroid disease     HYPOTHYROID  . Hypogonadism male   . Pre-diabetes        Review of Systems  Constitutional: Positive for fatigue.  HENT: Positive for congestion, postnasal drip, sinus pressure and sore throat.   Respiratory: Positive for cough.   Neurological: Positive for weakness.  Psychiatric/Behavioral: Positive for confusion.  All other systems reviewed and are negative.  BP 130/82  Pulse 78  Temp(Src) 98 F (36.7 C) (Temporal)  Resp 18  Ht 5' 10.5" (1.791 m)  Wt 205 lb (92.987 kg)  BMI 28.99 kg/m2      Objective:   Physical Exam  Nursing note and vitals reviewed. Constitutional: He is oriented to person, place, and time. He appears well-developed and well-nourished.  HENT:  Head: Normocephalic and atraumatic.  Right Ear: External ear normal.  Left Ear: External  ear normal.  Nose: Nose normal.  Mouth/Throat: Oropharynx is clear and moist. No oropharyngeal exudate.  Maxillary/ frontal tenderness, yellow TMs  Eyes: Conjunctivae are normal.  Neck: Normal range of motion.  Cardiovascular: Normal rate, regular rhythm, normal heart sounds and intact distal pulses.   Pulmonary/Chest: Effort normal and breath sounds normal.  Abdominal: Soft.  Musculoskeletal: Normal range of motion.  Lymphadenopathy:    He has no cervical adenopathy.  Neurological: He is alert and oriented to person, place, and time. Coordination abnormal.  Worse with initiating gait, stutter steps. Also with difficulty speaking/ finding words  Skin: Skin is warm and dry.  Psychiatric: He has a normal mood and affect. Judgment normal.           Assessment & Plan:  1. Sinusitis/ Allergic rhinitis- Allegra OTC, increase H2o, allergy hygiene explained. Add AStepro to NS, 1 spray ea nostril of both sprays. Zpak/ Pred dp 10mg  both AD  2. Parkinsons- Mild increased SX advised to try to increase protein/ h2o intake. Make sure doing exercises QD for strength/ flexibilty/ mind. If SX cont to increase he w/c Neuro.

## 2013-10-13 NOTE — Patient Instructions (Signed)
Allergic Rhinitis Allergic rhinitis is when the mucous membranes in the nose respond to allergens. Allergens are particles in the air that cause your body to have an allergic reaction. This causes you to release allergic antibodies. Through a chain of events, these eventually cause you to release histamine into the blood stream (hence the use of antihistamines). Although meant to be protective to the body, it is this release that causes your discomfort, such as frequent sneezing, congestion and an itchy runny nose.  CAUSES  The pollen allergens may come from grasses, trees, and weeds. This is seasonal allergic rhinitis, or "hay fever." Other allergens cause year-round allergic rhinitis (perennial allergic rhinitis) such as house dust mite allergen, pet dander and mold spores.  SYMPTOMS   Nasal stuffiness (congestion).  Runny, itchy nose with sneezing and tearing of the eyes.  There is often an itching of the mouth, eyes and ears. It cannot be cured, but it can be controlled with medications. DIAGNOSIS  If you are unable to determine the offending allergen, skin or blood testing may find it. TREATMENT   Avoid the allergen.  Medications and allergy shots (immunotherapy) can help.  Hay fever may often be treated with antihistamines in pill or nasal spray forms. Antihistamines block the effects of histamine. There are over-the-counter medicines that may help with nasal congestion and swelling around the eyes. Check with your caregiver before taking or giving this medicine. If the treatment above does not work, there are many new medications your caregiver can prescribe. Stronger medications may be used if initial measures are ineffective. Desensitizing injections can be used if medications and avoidance fails. Desensitization is when a patient is given ongoing shots until the body becomes less sensitive to the allergen. Make sure you follow up with your caregiver if problems continue. SEEK MEDICAL  CARE IF:   You develop fever (more than 100.5 F (38.1 C).  You develop a cough that does not stop easily (persistent).  You have shortness of breath.  You start wheezing.  Symptoms interfere with normal daily activities. Document Released: 06/19/2001 Document Revised: 12/17/2011 Document Reviewed: 12/29/2008 [facility] Patient Information 2014 [place]. High Protein Diet A high protein diet means that high protein foods are added to your diet. Getting more protein in the diet is important for a number of reasons. Protein helps the body to build tissue, muscle, and to repair damage. People who have had surgery, injuries such as broken bones, infections, and burns, or illnesses such as cancer, may need more protein in their diet.  SERVING SIZES Measuring foods and serving sizes helps to make sure you are getting the right amount of food. The list below tells how big or small some common serving sizes are.   1 oz.........4 stacked dice.  3 oz........Marland KitchenDeck of cards.  1 tsp.......Marland KitchenTip of little finger.  1 tbs......Marland KitchenMarland KitchenThumb.  2 tbs.......Marland KitchenGolf ball.   cup......Marland KitchenHalf of a fist.  1 cup.......Marland KitchenA fist. FOOD SOURCES OF PROTEIN Listed below are some food sources of protein and the amount of protein they contain. Your Registered Dietitian can calculate how many grams of protein you need for your medical condition. High protein foods can be added to the diet at mealtime or as snacks. Be sure to have at least 1 protein-containing food at each meal and snack to ensure adequate intake.  Meats and Meat Substitutes / Protein (g)  3 oz poultry (chicken, [place]) / 26 g  3 oz tuna, canned in water / 26 g  3 oz fish (  cod) / 21 g  3 oz red meat (beef, pork) / 21 g  4 oz tofu / 9 g  1 egg / 6 g   cup egg substitute / 5 g  1 cup dried beans / 15 g  1 cup soy milk / 4 g Dairy / Protein (g)  1 cup milk (skim, 1%, 2%, whole) / 8 g   cup evaporated milk / 9 g  1 cup buttermilk /  8 g  1 cup low-fat plain yogurt / 11 g  1 cup regular plain yogurt / 9 g   cup cottage cheese / 14 g  1 oz cheddar cheese / 7 g Nuts / Protein (g)  2 tbs peanut butter / 8 g  1 oz peanuts / 7 g  2 tbs cashews / 5 g  2 tbs almonds / 5 g Document Released: 09/24/2005 Document Revised: 12/17/2011 Document Reviewed: 06/27/2007 [employer] Patient Information 2014 [place], [place].

## 2013-10-30 ENCOUNTER — Ambulatory Visit: Payer: Medicare Other

## 2013-11-12 ENCOUNTER — Encounter: Payer: Self-pay | Admitting: Internal Medicine

## 2013-11-12 ENCOUNTER — Telehealth: Payer: Self-pay | Admitting: Neurology

## 2013-11-12 ENCOUNTER — Ambulatory Visit (INDEPENDENT_AMBULATORY_CARE_PROVIDER_SITE_OTHER): Payer: Medicare Other | Admitting: Internal Medicine

## 2013-11-12 ENCOUNTER — Other Ambulatory Visit: Payer: Self-pay | Admitting: Internal Medicine

## 2013-11-12 VITALS — BP 150/82 | HR 81 | Temp 98.0°F | Resp 20 | Ht 69.0 in | Wt 205.0 lb

## 2013-11-12 DIAGNOSIS — G4700 Insomnia, unspecified: Secondary | ICD-10-CM | POA: Diagnosis not present

## 2013-11-12 DIAGNOSIS — G20 Parkinson's disease: Secondary | ICD-10-CM | POA: Diagnosis not present

## 2013-11-12 DIAGNOSIS — I10 Essential (primary) hypertension: Secondary | ICD-10-CM | POA: Diagnosis not present

## 2013-11-12 DIAGNOSIS — G20C Parkinsonism, unspecified: Secondary | ICD-10-CM

## 2013-11-12 DIAGNOSIS — E291 Testicular hypofunction: Secondary | ICD-10-CM

## 2013-11-12 DIAGNOSIS — J302 Other seasonal allergic rhinitis: Secondary | ICD-10-CM

## 2013-11-12 DIAGNOSIS — G20A1 Parkinson's disease without dyskinesia, without mention of fluctuations: Secondary | ICD-10-CM

## 2013-11-12 DIAGNOSIS — J309 Allergic rhinitis, unspecified: Secondary | ICD-10-CM | POA: Diagnosis not present

## 2013-11-12 DIAGNOSIS — J3089 Other allergic rhinitis: Secondary | ICD-10-CM

## 2013-11-12 DIAGNOSIS — E349 Endocrine disorder, unspecified: Secondary | ICD-10-CM

## 2013-11-12 MED ORDER — TAMSULOSIN HCL 0.4 MG PO CAPS: 0.4000 mg | ORAL_CAPSULE | Freq: Every day | ORAL | Status: DC

## 2013-11-12 MED ORDER — TRAZODONE 25 MG HALF TABLET: 25.0000 mg | ORAL_TABLET | Freq: Every day | ORAL | Status: DC

## 2013-11-12 NOTE — Progress Notes (Signed)
Pre-visit discussion using our clinic review tool. No additional management support is needed unless otherwise documented below in the visit note.  

## 2013-11-12 NOTE — Progress Notes (Signed)
Subjective:    Patient ID: [name], male    DOB: 03-Apr-1946, 68 y.o.   MRN: [id number]  HPI  68 year old patient who is seen today to status with this practice. Chronic medical problems include hypertension glaucoma allergic rhinitis and history of impaired glucose tolerance. He is on testosterone replacement for testosterone deficiency and has a history of vitamin D. deficiency. He is followed by neurology due to PD but has not been very tolerant of medications. He has had a recent annual exam 2 months ago. He is followed by ophthalmology and is up-to-date on colonoscopies  Family history father died at age 12 with liver cancer also had hypothyroidism Mother died 59 complications of COPD One brother history of schizophrenia 2 sisters' COPD lupus and hypothyroidism  Social history. Retired Film/video editor. Married for 46 years. 2 daughters and 3 grandchildren. No tobacco use  Surgical history prior tonsillectomy and uvuloplasty. 1992 lumbar disc disease surgery  Past Medical History  Diagnosis Date  . HTN (hypertension)   . Glaucoma   . OSA (obstructive sleep apnea)   . Allergic rhinitis   . Parkinsonism 01/16/2013  . Hyperlipidemia   . Thyroid disease     HYPOTHYROID  . Hypogonadism male   . Pre-diabetes     History   Social History  . Marital Status: Married    Spouse Name: N/A    Number of Children: N/A  . Years of Education: N/A   Occupational History  . branch operation manager    Social History Main Topics  . Smoking status: Never Smoker   . Smokeless tobacco: Never Used  . Alcohol Use: Yes  . Drug Use: No  . Sexual Activity: Not on file   Other Topics Concern  . Not on file   Social History Narrative  . No narrative on file    Past Surgical History  Procedure Laterality Date  . Uvulopalatopharyngoplasty    . Lumbar disc surgery    . Cataract extraction      Family History  Problem Relation Age of Onset  . Emphysema Mother   . COPD  Mother   . Heart disease Mother   . Liver cancer Father   . Cancer Father   . Diabetes Father   . Heart disease Sister   . COPD Sister     Allergies  Allergen Reactions  . Ambien [Zolpidem Tartrate]   . Atenolol   . Lunesta [Eszopiclone]   . Oxazepam   . Requip [Ropinirole Hcl]   . Ziac [Bisoprolol-Hydrochlorothiazide]     Current Outpatient Prescriptions on File Prior to Visit  Medication Sig Dispense Refill  . amitriptyline (ELAVIL) 25 MG tablet Take 1 tablet (25 mg total) by mouth at bedtime.  30 tablet  12  . aspirin 81 MG tablet Take 81 mg by mouth daily.        . bimatoprost (LUMIGAN) 0.01 % SOLN Place 1 drop into both eyes at bedtime.        . Cholecalciferol (VITAMIN D3) 10000 UNITS capsule Take 10,000 Units by mouth. 3 times a week       . fexofenadine (ALLEGRA) 180 MG tablet Take 180 mg by mouth daily.        . fish oil-omega-3 fatty acids 1000 MG capsule Take by mouth daily. 2400 mg on Tuesday, Thursday, and saturday      . fluticasone (FLONASE) 50 MCG/ACT nasal spray USE 1-2 SPRAYS IN EACH NOSTRIL EVERY DAY  16 g  6  .  GUAIFENESIN PO Take by mouth 3 (three) times daily.      Marland Kitchen levothyroxine (SYNTHROID, LEVOTHROID) 50 MCG tablet TAKE 1 TABLET EVERY DAY  90 tablet  1  . losartan (COZAAR) 100 MG tablet Take 100 mg by mouth daily.      . magnesium gluconate (MAGONATE) 500 MG tablet Take 500 mg by mouth as needed.      . Multiple Vitamins-Minerals (MULTIVITAMIN WITH MINERALS) tablet Take 1 tablet by mouth daily.        . Naproxen Sodium (ALEVE PO) Take by mouth as needed.      . testosterone cypionate (DEPOTESTOTERONE CYPIONATE) 200 MG/ML injection INJECT 2 MILLILITERS EVERY 2 weeks       No current facility-administered medications on file prior to visit.    BP 150/82  Pulse 81  Temp(Src) 98 F (36.7 C) (Oral)  Resp 20  Ht 5\' 9"  (1.753 m)  Wt 205 lb (92.987 kg)  BMI 30.26 kg/m2  SpO2 97%     Review of Systems  Constitutional: Negative for fever, chills,  activity change, appetite change and fatigue.  HENT: Negative for congestion, dental problem, ear pain, hearing loss, mouth sores, rhinorrhea, sinus pressure, sneezing, tinnitus, trouble swallowing and voice change.   Eyes: Negative for photophobia, pain, redness and visual disturbance.  Respiratory: Negative for apnea, cough, choking, chest tightness, shortness of breath and wheezing.   Cardiovascular: Negative for chest pain, palpitations and leg swelling.  Gastrointestinal: Negative for nausea, vomiting, abdominal pain, diarrhea, constipation, blood in stool, abdominal distention, anal bleeding and rectal pain.  Genitourinary: Positive for urgency. Negative for dysuria, frequency, hematuria, flank pain, decreased urine volume, discharge, penile swelling, scrotal swelling, difficulty urinating, genital sores and testicular pain.  Musculoskeletal: Positive for gait problem. Negative for arthralgias, back pain, joint swelling, myalgias, neck pain and neck stiffness.  Skin: Negative for color change, rash and wound.  Neurological: Negative for dizziness, tremors, seizures, syncope, facial asymmetry, speech difficulty, weakness, light-headedness, numbness and headaches.  Hematological: Negative for adenopathy. Does not bruise/bleed easily.  Psychiatric/Behavioral: Negative for suicidal ideas, hallucinations, behavioral problems, confusion, sleep disturbance, self-injury, dysphoric mood, decreased concentration and agitation. The patient is not nervous/anxious.        Objective:   Physical Exam  Constitutional: He appears well-developed and well-nourished.  HENT:  Head: Normocephalic and atraumatic.  Right Ear: External ear normal.  Left Ear: External ear normal.  Nose: Nose normal.  Mouth/Throat: Oropharynx is clear and moist.  Pharyngeal crowding Status post palatoplasty he  Eyes: Conjunctivae and EOM are normal. Pupils are equal, round, and reactive to light. No scleral icterus.  Neck:  Normal range of motion. Neck supple. No JVD present. No thyromegaly present.  Cardiovascular: Regular rhythm, normal heart sounds and intact distal pulses.  Exam reveals no gallop and no friction rub.   No murmur heard. Dorsalis pedis pulses full. Posterior tibial pulses not easily palpable  Pulmonary/Chest: Effort normal and breath sounds normal. He exhibits no tenderness.  Abdominal: Soft. Bowel sounds are normal. He exhibits no distension and no mass. There is no tenderness.  Genitourinary: Penis normal.  Musculoskeletal: Normal range of motion. He exhibits no edema and no tenderness.  Lymphadenopathy:    He has no cervical adenopathy.  Neurological: He is alert. He has normal reflexes. No cranial nerve deficit. Coordination normal.  Skin: Skin is warm and dry. No rash noted.  Psychiatric: He has a normal mood and affect. His behavior is normal.  Assessment & Plan:   Hypertension. Will continue home blood pressure monitoring and present regimen. Modest weight loss salt restricted diet encouraged Seasonal allergic rhinitis/status post recent URI BPH. Patient has been on daily Cialis in the past but apparently has not tried Flomax.  No history of orthostatic hypotension with PD. Patient has occasional urgency and incontinence Parkinson's disease. Followup neurology

## 2013-11-12 NOTE — Telephone Encounter (Signed)
ok 

## 2013-11-12 NOTE — Patient Instructions (Signed)
Limit your sodium (Salt) intake    It is important that you exercise regularly, at least 20 minutes 3 to 4 times per week.  If you develop chest pain or shortness of breath seek  medical attention.  Please check your blood pressure on a regular basis.  If it is consistently greater than 150/90, please make an office appointment.  Take a calcium supplement, plus 800-1200 units of vitamin D 

## 2013-11-12 NOTE — Telephone Encounter (Signed)
Patient went to an internist today who suggested he start Flomax  (is this okay with Dr. Rexene Alberts to do?)  - suggested to stop taking Elavil at night and replace it with trazodone 25 mg. They would like Dr. Guadelupe Sabin input before any changes are made. Please call to advise.

## 2013-11-12 NOTE — Telephone Encounter (Signed)
Please advise if okay to refill. 

## 2013-11-13 ENCOUNTER — Telehealth: Payer: Self-pay | Admitting: Internal Medicine

## 2013-11-13 NOTE — Telephone Encounter (Signed)
Informed patient's wife of Dr Guadelupe Sabin note below, she verbalized understanding

## 2013-11-13 NOTE — Telephone Encounter (Signed)
Relevant patient education assigned to patient using Emmi. ° °

## 2013-11-13 NOTE — Telephone Encounter (Signed)
Okay to make those changes. thx for the notification and update, please let him or wife know.

## 2013-11-13 NOTE — Telephone Encounter (Signed)
Pt notified Rx for Testosterone injections called into pharmacy.

## 2013-11-13 NOTE — Telephone Encounter (Signed)
Pharm following up on request due to pt deing new w/ [place]

## 2013-11-16 ENCOUNTER — Ambulatory Visit (INDEPENDENT_AMBULATORY_CARE_PROVIDER_SITE_OTHER): Payer: Medicare Other | Admitting: *Deleted

## 2013-11-16 DIAGNOSIS — E349 Endocrine disorder, unspecified: Secondary | ICD-10-CM

## 2013-11-16 DIAGNOSIS — E291 Testicular hypofunction: Secondary | ICD-10-CM | POA: Diagnosis not present

## 2013-11-16 MED ORDER — TESTOSTERONE CYPIONATE 200 MG/ML IM SOLN
400.0000 mg | INTRAMUSCULAR | Status: DC
  Administered 2013-11-16: 400 mg via INTRAMUSCULAR

## 2013-11-16 MED ORDER — TESTOSTERONE CYPIONATE 200 MG/ML IM SOLN: 200.0000 mg | INTRAMUSCULAR | Status: DC

## 2013-11-20 ENCOUNTER — Telehealth: Payer: Self-pay | Admitting: Internal Medicine

## 2013-11-20 DIAGNOSIS — R3915 Urgency of urination: Secondary | ICD-10-CM

## 2013-11-20 DIAGNOSIS — E349 Endocrine disorder, unspecified: Secondary | ICD-10-CM

## 2013-11-20 NOTE — Telephone Encounter (Signed)
Ask  patient if he would like to resume Cialis daily for BPH symptoms or whether he would like to see a urologist

## 2013-11-20 NOTE — Telephone Encounter (Signed)
Patient came in the office asking to speak with [name] about his bladder control medication that he says is not working.

## 2013-11-20 NOTE — Telephone Encounter (Signed)
Dr. K, please see message and advise. 

## 2013-11-20 NOTE — Telephone Encounter (Signed)
Spoke to pt c/o Tamsulosin not helping bladder control at all. Told him will send message to Dr. Raliegh Ip and get back to him. Pt verbalized understanding.

## 2013-11-21 NOTE — Telephone Encounter (Signed)
Spoke to pt's [name], told her Dr. Raliegh Ip said pt can go back on Cialis daily for [facility] symptoms or we could send him to a urologist. Also only on Flomax for one week, usually takes 3-4 weeks to see any difference. [name] said she will let pt know and have him give me a call on Monday. Told her okay.

## 2013-11-25 NOTE — Telephone Encounter (Signed)
Spoke to pt asked him if he wants to go back on Cialis or see Urologist? Pt stated Urologist. Told pt okay will send order for referral to Urologist and someone will be in contact with you. Pt verbalized understanding. Order for Urology referral done.

## 2013-11-30 ENCOUNTER — Ambulatory Visit: Payer: Self-pay | Admitting: Physician Assistant

## 2013-12-07 ENCOUNTER — Ambulatory Visit (INDEPENDENT_AMBULATORY_CARE_PROVIDER_SITE_OTHER): Payer: Medicare Other | Admitting: *Deleted

## 2013-12-07 DIAGNOSIS — E291 Testicular hypofunction: Secondary | ICD-10-CM | POA: Diagnosis not present

## 2013-12-07 DIAGNOSIS — E349 Endocrine disorder, unspecified: Secondary | ICD-10-CM

## 2013-12-07 MED ORDER — TESTOSTERONE CYPIONATE 100 MG/ML IM SOLN
400.0000 mg | INTRAMUSCULAR | Status: DC
Start: 2013-12-07 — End: 2013-12-07
  Administered 2013-12-07: 400 mg via INTRAMUSCULAR

## 2013-12-08 ENCOUNTER — Encounter: Payer: Self-pay | Admitting: Family Medicine

## 2013-12-08 ENCOUNTER — Ambulatory Visit (INDEPENDENT_AMBULATORY_CARE_PROVIDER_SITE_OTHER): Payer: Medicare Other | Admitting: Family Medicine

## 2013-12-08 VITALS — HR 80 | Temp 98.3°F | Wt 210.0 lb

## 2013-12-08 DIAGNOSIS — R0982 Postnasal drip: Secondary | ICD-10-CM | POA: Diagnosis not present

## 2013-12-08 DIAGNOSIS — R059 Cough, unspecified: Secondary | ICD-10-CM

## 2013-12-08 DIAGNOSIS — R05 Cough: Secondary | ICD-10-CM | POA: Diagnosis not present

## 2013-12-08 NOTE — Patient Instructions (Signed)
-  take your flonase 2 sprays daily   -take allegra daily  -take the prilosec 20mg  daily  -follow up in 1 month

## 2013-12-08 NOTE — Progress Notes (Signed)
Chief Complaint  Patient presents with  . Cough    HPI:  -started: a long time ogo -symptoms:nasal congestion, PND, productive cough, sneezing -denies:fever, NVD, tooth pain, SOB, wheezing, weight loss -does have acid reflux chronically and sees Dr. Annamaria Boots in pulm -takes flonase but currently not taking his allegra  ROS: See pertinent positives and negatives per HPI.  Past Medical History  Diagnosis Date  . HTN (hypertension)   . Glaucoma   . OSA (obstructive sleep apnea)   . Allergic rhinitis   . Parkinsonism 01/16/2013  . Hyperlipidemia   . Thyroid disease     HYPOTHYROID  . Hypogonadism male   . Pre-diabetes     Past Surgical History  Procedure Laterality Date  . Uvulopalatopharyngoplasty    . Lumbar disc surgery    . Cataract extraction      Family History  Problem Relation Age of Onset  . Emphysema Mother   . COPD Mother   . Heart disease Mother   . Liver cancer Father   . Cancer Father   . Diabetes Father   . Heart disease Sister   . COPD Sister     History   Social History  . Marital Status: Married    Spouse Name: N/A    Number of Children: N/A  . Years of Education: N/A   Occupational History  . branch operation manager    Social History Main Topics  . Smoking status: Never Smoker   . Smokeless tobacco: Never Used  . Alcohol Use: Yes  . Drug Use: No  . Sexual Activity: None   Other Topics Concern  . None   Social History Narrative  . None    Current outpatient prescriptions:aspirin 81 MG tablet, Take 81 mg by mouth daily.  , Disp: , Rfl: ;  bimatoprost (LUMIGAN) 0.01 % SOLN, Place 1 drop into both eyes at bedtime.  , Disp: , Rfl: ;  Cholecalciferol (VITAMIN D3) 10000 UNITS capsule, Take 10,000 Units by mouth. 3 times a week , Disp: , Rfl: ;  fexofenadine (ALLEGRA) 180 MG tablet, Take 180 mg by mouth daily.  , Disp: , Rfl:  fish oil-omega-3 fatty acids 1000 MG capsule, Take by mouth daily. 2400 mg on Tuesday, Thursday, and saturday,  Disp: , Rfl: ;  fluticasone (FLONASE) 50 MCG/ACT nasal spray, USE 1-2 SPRAYS IN EACH NOSTRIL EVERY DAY, Disp: 16 g, Rfl: 6;  GUAIFENESIN PO, Take by mouth 3 (three) times daily., Disp: , Rfl: ;  levothyroxine (SYNTHROID, LEVOTHROID) 50 MCG tablet, TAKE 1 TABLET EVERY DAY, Disp: 90 tablet, Rfl: 1 losartan (COZAAR) 100 MG tablet, Take 100 mg by mouth daily., Disp: , Rfl: ;  magnesium gluconate (MAGONATE) 500 MG tablet, Take 500 mg by mouth as needed., Disp: , Rfl: ;  Multiple Vitamins-Minerals (MULTIVITAMIN WITH MINERALS) tablet, Take 1 tablet by mouth daily.  , Disp: , Rfl: ;  Naproxen Sodium (ALEVE PO), Take by mouth as needed., Disp: , Rfl:  tamsulosin (FLOMAX) 0.4 MG CAPS capsule, Take 1 capsule (0.4 mg total) by mouth daily., Disp: 30 capsule, Rfl: 3;  traZODone (DESYREL) 25 mg TABS tablet, Take 0.5 tablets (25 mg total) by mouth at bedtime., Disp: 90 tablet, Rfl: 3  EXAM:  Filed Vitals:   12/08/13 0805  Pulse: 80  Temp: 98.3 F (36.8 C)    Body mass index is 31 kg/(m^2).  GENERAL: vitals reviewed and listed above, alert, oriented, appears well hydrated and in no acute distress  HEENT: atraumatic, conjunttiva clear,  no obvious abnormalities on inspection of external nose and ears, normal appearance of ear canals and TMs, clear nasal congestion, mild post oropharyngeal erythema with PND, no tonsillar edema or exudate, no sinus TTP  NECK: no obvious masses on inspection  LUNGS: clear to auscultation bilaterally, no wheezes, rales or rhonchi, good air movement  CV: HRRR, no peripheral edema  MS: moves all extremities without noticeable abnormality  PSYCH: pleasant and cooperative, no obvious depression or anxiety  ASSESSMENT AND PLAN:  Discussed the following assessment and plan:  Cough  PND (post-nasal drip)  -we discussed possible serious and likely etiologies, workup and treatment, treatment risks and return precautions with PND, allergic rhinosinusitis, GERD all  possibilities -after this discussion, Dawayne opted for flonase daily, adding allegra and prilosec 20mg  for one month and he may want to see his pulmonologist in follow up if cough persists -follow up in one month -of course, we advised Irvan  to return or notify a doctor immediately if symptoms worsen or persist or new concerns arise.     Patient Instructions  -take your flonase 2 sprays daily   -take allegra daily  -take the prilosec 20mg  daily  -follow up in 1 month     KIM, HANNAH R.

## 2013-12-08 NOTE — Progress Notes (Signed)
Pre visit review using our clinic review tool, if applicable. No additional management support is needed unless otherwise documented below in the visit note. 

## 2013-12-14 DIAGNOSIS — R339 Retention of urine, unspecified: Secondary | ICD-10-CM | POA: Diagnosis not present

## 2013-12-17 ENCOUNTER — Encounter (INDEPENDENT_AMBULATORY_CARE_PROVIDER_SITE_OTHER): Payer: Self-pay

## 2013-12-17 ENCOUNTER — Encounter: Payer: Self-pay | Admitting: Neurology

## 2013-12-17 ENCOUNTER — Ambulatory Visit (INDEPENDENT_AMBULATORY_CARE_PROVIDER_SITE_OTHER): Payer: Medicare Other | Admitting: Neurology

## 2013-12-17 VITALS — BP 131/82 | HR 69 | Temp 97.8°F | Ht 70.0 in | Wt 210.0 lb

## 2013-12-17 DIAGNOSIS — Z9181 History of falling: Secondary | ICD-10-CM | POA: Diagnosis not present

## 2013-12-17 DIAGNOSIS — R413 Other amnesia: Secondary | ICD-10-CM

## 2013-12-17 DIAGNOSIS — R269 Unspecified abnormalities of gait and mobility: Secondary | ICD-10-CM

## 2013-12-17 DIAGNOSIS — G20 Parkinson's disease: Secondary | ICD-10-CM

## 2013-12-17 DIAGNOSIS — R1310 Dysphagia, unspecified: Secondary | ICD-10-CM

## 2013-12-17 DIAGNOSIS — R296 Repeated falls: Secondary | ICD-10-CM
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
## 2013-12-22 DIAGNOSIS — N529 Male erectile dysfunction, unspecified: Secondary | ICD-10-CM | POA: Diagnosis not present

## 2013-12-22 DIAGNOSIS — N281 Cyst of kidney, acquired: Secondary | ICD-10-CM | POA: Diagnosis not present

## 2013-12-22 DIAGNOSIS — N3946 Mixed incontinence: Secondary | ICD-10-CM | POA: Diagnosis not present

## 2013-12-22 DIAGNOSIS — R339 Retention of urine, unspecified: Secondary | ICD-10-CM | POA: Diagnosis not present

## 2013-12-25 ENCOUNTER — Ambulatory Visit (INDEPENDENT_AMBULATORY_CARE_PROVIDER_SITE_OTHER): Payer: Medicare Other | Admitting: *Deleted

## 2013-12-25 DIAGNOSIS — E349 Endocrine disorder, unspecified: Secondary | ICD-10-CM

## 2013-12-25 DIAGNOSIS — E291 Testicular hypofunction: Secondary | ICD-10-CM | POA: Diagnosis not present

## 2013-12-25 MED ORDER — TESTOSTERONE CYPIONATE 200 MG/ML IM SOLN
400.0000 mg | INTRAMUSCULAR | Status: DC
  Administered 2013-12-25: 400 mg via INTRAMUSCULAR

## 2014-01-07 ENCOUNTER — Other Ambulatory Visit: Payer: Self-pay | Admitting: Neurology

## 2014-01-07 NOTE — Telephone Encounter (Signed)
Closing encounter

## 2014-01-08 NOTE — Telephone Encounter (Signed)
Last OV says: He was on amitriptyline, which had been started by Dr. Brett Fairy in November last year. He had reported, that his gait was a little better since the amitriptyline, but he called in the interim in February and requested another medication. I suggested a trial of low dose of Sinemet 1/2 pill tid, but he called back d/t sedation and eventually stopped it and re-started Elavil. He has benefitted from therapy

## 2014-01-11 ENCOUNTER — Ambulatory Visit: Payer: Medicare Other | Attending: Neurology

## 2014-01-11 DIAGNOSIS — R269 Unspecified abnormalities of gait and mobility: Secondary | ICD-10-CM | POA: Diagnosis not present

## 2014-01-11 DIAGNOSIS — IMO0001 Reserved for inherently not codable concepts without codable children: Secondary | ICD-10-CM | POA: Diagnosis not present

## 2014-01-14 ENCOUNTER — Ambulatory Visit (INDEPENDENT_AMBULATORY_CARE_PROVIDER_SITE_OTHER): Payer: Medicare Other | Admitting: *Deleted

## 2014-01-14 DIAGNOSIS — E291 Testicular hypofunction: Secondary | ICD-10-CM

## 2014-01-14 MED ORDER — TESTOSTERONE CYPIONATE 200 MG/ML IM SOLN
400.0000 mg | INTRAMUSCULAR | Status: DC
Start: 2014-01-14 — End: 2014-01-14
  Administered 2014-01-14: 400 mg via INTRAMUSCULAR

## 2014-01-25 DIAGNOSIS — N281 Cyst of kidney, acquired: Secondary | ICD-10-CM | POA: Diagnosis not present

## 2014-01-25 DIAGNOSIS — R339 Retention of urine, unspecified: Secondary | ICD-10-CM | POA: Diagnosis not present

## 2014-01-25 DIAGNOSIS — N319 Neuromuscular dysfunction of bladder, unspecified: Secondary | ICD-10-CM | POA: Diagnosis not present

## 2014-01-25 DIAGNOSIS — N3946 Mixed incontinence: Secondary | ICD-10-CM | POA: Diagnosis not present

## 2014-02-04 ENCOUNTER — Ambulatory Visit (INDEPENDENT_AMBULATORY_CARE_PROVIDER_SITE_OTHER): Payer: Medicare Other | Admitting: *Deleted

## 2014-02-04 DIAGNOSIS — E291 Testicular hypofunction: Secondary | ICD-10-CM

## 2014-02-04 DIAGNOSIS — E349 Endocrine disorder, unspecified: Secondary | ICD-10-CM

## 2014-02-04 MED ORDER — TESTOSTERONE CYPIONATE 200 MG/ML IM SOLN
400.0000 mg | INTRAMUSCULAR | Status: DC
  Administered 2014-02-04: 400 mg via INTRAMUSCULAR

## 2014-02-11 DIAGNOSIS — Z6829 Body mass index (BMI) 29.0-29.9, adult: Secondary | ICD-10-CM | POA: Diagnosis not present

## 2014-02-11 DIAGNOSIS — I10 Essential (primary) hypertension: Secondary | ICD-10-CM | POA: Diagnosis not present

## 2014-02-11 DIAGNOSIS — R269 Unspecified abnormalities of gait and mobility: Secondary | ICD-10-CM | POA: Diagnosis not present

## 2014-02-15 DIAGNOSIS — R269 Unspecified abnormalities of gait and mobility: Secondary | ICD-10-CM | POA: Diagnosis not present

## 2014-02-15 DIAGNOSIS — G9389 Other specified disorders of brain: Secondary | ICD-10-CM | POA: Diagnosis not present

## 2014-02-15 DIAGNOSIS — M4802 Spinal stenosis, cervical region: Secondary | ICD-10-CM | POA: Diagnosis not present

## 2014-02-15 DIAGNOSIS — M5030 Other cervical disc degeneration, unspecified cervical region: Secondary | ICD-10-CM | POA: Diagnosis not present

## 2014-02-17 DIAGNOSIS — G950 Syringomyelia and syringobulbia: Secondary | ICD-10-CM | POA: Diagnosis not present

## 2014-02-17 DIAGNOSIS — Z6830 Body mass index (BMI) 30.0-30.9, adult: Secondary | ICD-10-CM | POA: Diagnosis not present

## 2014-02-19 DIAGNOSIS — G950 Syringomyelia and syringobulbia: Secondary | ICD-10-CM | POA: Diagnosis not present

## 2014-02-23 DIAGNOSIS — M5090 Cervical disc disorder, unspecified, unspecified cervical region: Secondary | ICD-10-CM | POA: Diagnosis not present

## 2014-02-23 DIAGNOSIS — G950 Syringomyelia and syringobulbia: Secondary | ICD-10-CM | POA: Diagnosis not present

## 2014-02-25 ENCOUNTER — Ambulatory Visit (INDEPENDENT_AMBULATORY_CARE_PROVIDER_SITE_OTHER): Payer: Medicare Other | Admitting: *Deleted

## 2014-02-25 DIAGNOSIS — E291 Testicular hypofunction: Secondary | ICD-10-CM

## 2014-02-25 DIAGNOSIS — E349 Endocrine disorder, unspecified: Secondary | ICD-10-CM

## 2014-02-25 MED ORDER — TESTOSTERONE CYPIONATE 100 MG/ML IM SOLN
400.0000 mg | Freq: Once | INTRAMUSCULAR | Status: AC
  Administered 2014-02-25: 400 mg via INTRAMUSCULAR

## 2014-03-02 ENCOUNTER — Ambulatory Visit: Payer: Self-pay | Admitting: Internal Medicine

## 2014-03-03 DIAGNOSIS — Z6829 Body mass index (BMI) 29.0-29.9, adult: Secondary | ICD-10-CM | POA: Diagnosis not present

## 2014-03-03 DIAGNOSIS — R269 Unspecified abnormalities of gait and mobility: Secondary | ICD-10-CM | POA: Diagnosis not present

## 2014-03-09 DIAGNOSIS — H04219 Epiphora due to excess lacrimation, unspecified lacrimal gland: Secondary | ICD-10-CM | POA: Diagnosis not present

## 2014-03-09 DIAGNOSIS — H409 Unspecified glaucoma: Secondary | ICD-10-CM | POA: Diagnosis not present

## 2014-03-09 DIAGNOSIS — H4011X0 Primary open-angle glaucoma, stage unspecified: Secondary | ICD-10-CM | POA: Diagnosis not present

## 2014-03-18 ENCOUNTER — Ambulatory Visit: Payer: Medicare Other | Admitting: *Deleted

## 2014-03-19 ENCOUNTER — Ambulatory Visit (INDEPENDENT_AMBULATORY_CARE_PROVIDER_SITE_OTHER): Payer: Medicare Other | Admitting: *Deleted

## 2014-03-19 DIAGNOSIS — E349 Endocrine disorder, unspecified: Secondary | ICD-10-CM

## 2014-03-19 DIAGNOSIS — E291 Testicular hypofunction: Secondary | ICD-10-CM

## 2014-03-19 MED ORDER — TESTOSTERONE CYPIONATE 200 MG/ML IM SOLN
400.0000 mg | INTRAMUSCULAR | Status: DC
Start: 2014-03-19 — End: 2014-03-19
  Administered 2014-03-19: 400 mg via INTRAMUSCULAR

## 2014-03-25 ENCOUNTER — Other Ambulatory Visit: Payer: Self-pay | Admitting: Emergency Medicine

## 2014-03-26 ENCOUNTER — Other Ambulatory Visit: Payer: Self-pay | Admitting: Emergency Medicine

## 2014-04-06 ENCOUNTER — Encounter: Payer: Self-pay | Admitting: Physician Assistant

## 2014-04-12 ENCOUNTER — Ambulatory Visit (INDEPENDENT_AMBULATORY_CARE_PROVIDER_SITE_OTHER): Payer: Medicare Other | Admitting: *Deleted

## 2014-04-12 DIAGNOSIS — N529 Male erectile dysfunction, unspecified: Secondary | ICD-10-CM

## 2014-04-12 DIAGNOSIS — E291 Testicular hypofunction: Secondary | ICD-10-CM

## 2014-04-12 DIAGNOSIS — E349 Endocrine disorder, unspecified: Secondary | ICD-10-CM

## 2014-04-12 MED ORDER — TESTOSTERONE CYPIONATE 200 MG/ML IM SOLN
400.0000 mg | INTRAMUSCULAR | Status: DC
  Administered 2014-04-12: 400 mg via INTRAMUSCULAR

## 2014-04-15 DIAGNOSIS — M47817 Spondylosis without myelopathy or radiculopathy, lumbosacral region: Secondary | ICD-10-CM | POA: Diagnosis not present

## 2014-04-15 DIAGNOSIS — G950 Syringomyelia and syringobulbia: Secondary | ICD-10-CM | POA: Diagnosis not present

## 2014-04-15 DIAGNOSIS — Z6829 Body mass index (BMI) 29.0-29.9, adult: Secondary | ICD-10-CM | POA: Diagnosis not present

## 2014-05-02 ENCOUNTER — Other Ambulatory Visit: Payer: Self-pay | Admitting: Emergency Medicine

## 2014-05-02 ENCOUNTER — Other Ambulatory Visit: Payer: Self-pay | Admitting: Internal Medicine

## 2014-05-11 ENCOUNTER — Ambulatory Visit: Payer: Medicare Other | Admitting: Neurology

## 2014-05-13 ENCOUNTER — Encounter: Payer: Self-pay | Admitting: Internal Medicine

## 2014-05-13 ENCOUNTER — Ambulatory Visit (INDEPENDENT_AMBULATORY_CARE_PROVIDER_SITE_OTHER): Payer: Medicare Other | Admitting: Internal Medicine

## 2014-05-13 VITALS — BP 126/80 | HR 77 | Temp 97.9°F | Resp 20 | Ht 70.0 in | Wt 203.0 lb

## 2014-05-13 DIAGNOSIS — R5381 Other malaise: Secondary | ICD-10-CM

## 2014-05-13 DIAGNOSIS — E039 Hypothyroidism, unspecified: Secondary | ICD-10-CM

## 2014-05-13 DIAGNOSIS — Z79899 Other long term (current) drug therapy: Secondary | ICD-10-CM

## 2014-05-13 DIAGNOSIS — E291 Testicular hypofunction: Secondary | ICD-10-CM | POA: Diagnosis not present

## 2014-05-13 DIAGNOSIS — Z23 Encounter for immunization: Secondary | ICD-10-CM

## 2014-05-13 DIAGNOSIS — R5383 Other fatigue: Secondary | ICD-10-CM | POA: Diagnosis not present

## 2014-05-13 DIAGNOSIS — E349 Endocrine disorder, unspecified: Secondary | ICD-10-CM

## 2014-05-13 DIAGNOSIS — I10 Essential (primary) hypertension: Secondary | ICD-10-CM | POA: Diagnosis not present

## 2014-05-13 DIAGNOSIS — E782 Mixed hyperlipidemia: Secondary | ICD-10-CM

## 2014-05-13 LAB — CBC WITH DIFFERENTIAL/PLATELET
BASOS PCT: 0.4 % (ref 0.0–3.0)
Basophils Absolute: 0 10*3/uL (ref 0.0–0.1)
Eosinophils Absolute: 0.6 10*3/uL (ref 0.0–0.7)
Eosinophils Relative: 8.8 % — ABNORMAL HIGH (ref 0.0–5.0)
HCT: 52 % (ref 39.0–52.0)
Hemoglobin: 17.3 g/dL — ABNORMAL HIGH (ref 13.0–17.0)
Lymphocytes Relative: 24.5 % (ref 12.0–46.0)
Lymphs Abs: 1.6 10*3/uL (ref 0.7–4.0)
MCHC: 33.3 g/dL (ref 30.0–36.0)
MCV: 85.6 fl (ref 78.0–100.0)
MONO ABS: 0.5 10*3/uL (ref 0.1–1.0)
MONOS PCT: 7.1 % (ref 3.0–12.0)
NEUTROS PCT: 59.2 % (ref 43.0–77.0)
Neutro Abs: 3.9 10*3/uL (ref 1.4–7.7)
Platelets: 220 10*3/uL (ref 150.0–400.0)
RBC: 6.07 Mil/uL — ABNORMAL HIGH (ref 4.22–5.81)
RDW: 17.1 % — ABNORMAL HIGH (ref 11.5–15.5)
WBC: 6.5 10*3/uL (ref 4.0–10.5)

## 2014-05-13 LAB — VITAMIN B12: Vitamin B-12: 716 pg/mL (ref 211–911)

## 2014-05-13 LAB — TSH: TSH: 1.66 u[IU]/mL (ref 0.35–4.50)

## 2014-05-13 MED ORDER — TESTOSTERONE CYPIONATE 200 MG/ML IM SOLN
200.0000 mg | INTRAMUSCULAR | Status: DC
  Administered 2014-05-13: 400 mg via INTRAMUSCULAR

## 2014-05-13 NOTE — Progress Notes (Signed)
Pre visit review using our clinic review tool, if applicable. No additional management support is needed unless otherwise documented below in the visit note. 

## 2014-05-13 NOTE — Progress Notes (Signed)
Subjective:    Patient ID: [name], male    DOB: 1946-02-25, 68 y.o.   MRN: [id number]  HPI  A 68 year old patient who is seen today for his six-month followup.  He is followed by neurology for PD.  He is also followed by urology and has been placed on Cialis for BPH.  His only complaint today is some fatigue.  He remains quite active with visits to the [facility] 3 times per week.  No focal complaints.  He has treated hypertension and dyslipidemia.  He has testosterone deficiency.  Additionally, he has hypothyroidism.  Past Medical History  Diagnosis Date  . HTN (hypertension)   . Glaucoma   . OSA (obstructive sleep apnea)   . Allergic rhinitis   . Parkinsonism 01/16/2013  . Hyperlipidemia   . Thyroid disease     HYPOTHYROID  . Hypogonadism male   . Pre-diabetes     History   Social History  . Marital Status: Married    Spouse Name: N/A    Number of Children: N/A  . Years of Education: N/A   Occupational History  . branch operation manager    Social History Main Topics  . Smoking status: Never Smoker   . Smokeless tobacco: Never Used  . Alcohol Use: Yes  . Drug Use: No  . Sexual Activity: Not on file   Other Topics Concern  . Not on file   Social History Narrative  . No narrative on file    Past Surgical History  Procedure Laterality Date  . Uvulopalatopharyngoplasty    . Lumbar disc surgery    . Cataract extraction      Family History  Problem Relation Age of Onset  . Emphysema Mother   . COPD Mother   . Heart disease Mother   . Liver cancer Father   . Cancer Father   . Diabetes Father   . Heart disease Sister   . COPD Sister     Allergies  Allergen Reactions  . Ambien [Zolpidem Tartrate]   . Atenolol   . Lunesta [Eszopiclone]   . Oxazepam   . Requip [Ropinirole Hcl]   . Ziac [Bisoprolol-Hydrochlorothiazide]     Current Outpatient Prescriptions on File Prior to Visit  Medication Sig Dispense Refill  . amitriptyline (ELAVIL) 25 MG tablet  TAKE 1 TABLET BY MOUTH AT BEDTIME  90 tablet  1  . aspirin 81 MG tablet Take 81 mg by mouth daily.        . bimatoprost (LUMIGAN) 0.01 % SOLN Place 1 drop into both eyes at bedtime.        . Cholecalciferol (VITAMIN D3) 10000 UNITS capsule Take 10,000 Units by mouth. 3 times a week       . fexofenadine (ALLEGRA) 180 MG tablet Take 180 mg by mouth daily.        . fish oil-omega-3 fatty acids 1000 MG capsule Take by mouth daily. 2400 mg on Tuesday, Thursday, and saturday      . fluticasone (FLONASE) 50 MCG/ACT nasal spray USE 1-2 SPRAYS IN EACH NOSTRIL EVERY DAY  16 g  6  . GUAIFENESIN PO Take by mouth 3 (three) times daily.      Marland Kitchen levothyroxine (SYNTHROID, LEVOTHROID) 50 MCG tablet TAKE 1 TABLET EVERY DAY  90 tablet  1  . losartan (COZAAR) 100 MG tablet Take 100 mg by mouth daily.      . magnesium gluconate (MAGONATE) 500 MG tablet Take 500 mg by mouth as needed.      Marland Kitchen  Multiple Vitamins-Minerals (MULTIVITAMIN WITH MINERALS) tablet Take 1 tablet by mouth daily.        . Naproxen Sodium (ALEVE PO) Take by mouth as needed.      . tamsulosin (FLOMAX) 0.4 MG CAPS capsule Take 1 capsule (0.4 mg total) by mouth daily.  30 capsule  3   No current facility-administered medications on file prior to visit.    BP 126/80  Pulse 77  Temp(Src) 97.9 F (36.6 C) (Oral)  Resp 20  Ht 5\' 10"  (1.778 m)  Wt 203 lb (92.08 kg)  BMI 29.13 kg/m2  SpO2 98%   Review of Systems  Constitutional: Positive for fatigue. Negative for fever, chills and appetite change.  HENT: Negative for congestion, dental problem, ear pain, hearing loss, sore throat, tinnitus, trouble swallowing and voice change.   Eyes: Negative for pain, discharge and visual disturbance.  Respiratory: Negative for cough, chest tightness, wheezing and stridor.   Cardiovascular: Negative for chest pain, palpitations and leg swelling.  Gastrointestinal: Negative for nausea, vomiting, abdominal pain, diarrhea, constipation, blood in stool and  abdominal distention.  Genitourinary: Negative for urgency, hematuria, flank pain, discharge, difficulty urinating and genital sores.  Musculoskeletal: Negative for arthralgias, back pain, gait problem, joint swelling, myalgias and neck stiffness.  Skin: Negative for rash.  Neurological: Negative for dizziness, syncope, speech difficulty, weakness, numbness and headaches.  Hematological: Negative for adenopathy. Does not bruise/bleed easily.  Psychiatric/Behavioral: Negative for behavioral problems and dysphoric mood. The patient is not nervous/anxious.        Objective:   Physical Exam  Constitutional: He is oriented to person, place, and time. He appears well-developed.  HENT:  Head: Normocephalic.  Right Ear: External ear normal.  Left Ear: External ear normal.  Eyes: Conjunctivae and EOM are normal.  Neck: Normal range of motion.  Cardiovascular: Normal rate and normal heart sounds.   Pulmonary/Chest: Breath sounds normal.  Abdominal: Bowel sounds are normal.  Musculoskeletal: Normal range of motion. He exhibits no edema and no tenderness.  Neurological: He is alert and oriented to person, place, and time.  Psychiatric: He has a normal mood and affect. His behavior is normal.          Assessment & Plan:   Fatigue.  History of hypothyroidism.  We'll check a TSH.  Schedule CPX in 4 months Hypertension stable PD.  Follow neurology Chronic low back pain.  Patient has a scheduled epidural in the near future BPH.  Testosterone deficiency.  Followup urology  CPX 4 months

## 2014-05-13 NOTE — Patient Instructions (Signed)
Limit your sodium (Salt) intake    It is important that you exercise regularly, at least 20 minutes 3 to 4 times per week.  If you develop chest pain or shortness of breath seek  medical attention.  Return in 4 months for follow-up   

## 2014-05-14 ENCOUNTER — Telehealth: Payer: Self-pay | Admitting: Internal Medicine

## 2014-05-14 NOTE — Telephone Encounter (Signed)
Relevant patient education assigned to patient using Emmi. ° °

## 2014-05-18 ENCOUNTER — Ambulatory Visit (INDEPENDENT_AMBULATORY_CARE_PROVIDER_SITE_OTHER): Payer: Medicare Other | Admitting: Nurse Practitioner

## 2014-05-18 ENCOUNTER — Encounter: Payer: Self-pay | Admitting: Nurse Practitioner

## 2014-05-18 VITALS — BP 120/70 | HR 83 | Ht 70.0 in | Wt 207.4 lb

## 2014-05-18 DIAGNOSIS — Z9181 History of falling: Secondary | ICD-10-CM

## 2014-05-18 DIAGNOSIS — R413 Other amnesia: Secondary | ICD-10-CM

## 2014-05-18 DIAGNOSIS — G20A1 Parkinson's disease without dyskinesia, without mention of fluctuations: Secondary | ICD-10-CM

## 2014-05-18 DIAGNOSIS — R1310 Dysphagia, unspecified: Secondary | ICD-10-CM

## 2014-05-18 DIAGNOSIS — R269 Unspecified abnormalities of gait and mobility: Secondary | ICD-10-CM | POA: Diagnosis not present

## 2014-05-18 DIAGNOSIS — R296 Repeated falls: Secondary | ICD-10-CM

## 2014-05-18 DIAGNOSIS — G20 Parkinson's disease: Secondary | ICD-10-CM | POA: Diagnosis not present

## 2014-05-18 DIAGNOSIS — G20C Parkinsonism, unspecified: Secondary | ICD-10-CM

## 2014-05-18 NOTE — Patient Instructions (Addendum)
To that end I have asked him to have is at physical therapy reevaluation particularly for gait safety and the need for an assistive device for walking.   I will refer you again to [facility] for Gait and Balance Training.  Someone will call you in the next week with information and to set up an evaluation.   He is again advised to eat smaller bites, concentrate on eating rather than multitasking while eating and not mix liquid and solids at the same time, sit upright while eating and chew and swallow slowly to prevent choking.  I do feel that he is at a point where he should consider using a walker.   Continue exercising with the exercise bike at the [facility].   Follow up with Dr. Rexene Alberts in 4-6 months, sooner as needed.
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
## 2014-05-20 DIAGNOSIS — IMO0002 Reserved for concepts with insufficient information to code with codable children: Secondary | ICD-10-CM | POA: Diagnosis not present

## 2014-05-20 DIAGNOSIS — M47817 Spondylosis without myelopathy or radiculopathy, lumbosacral region: Secondary | ICD-10-CM | POA: Diagnosis not present

## 2014-05-28 ENCOUNTER — Ambulatory Visit: Payer: Medicare Other | Attending: Nurse Practitioner | Admitting: Physical Therapy

## 2014-05-28 DIAGNOSIS — R279 Unspecified lack of coordination: Secondary | ICD-10-CM | POA: Diagnosis not present

## 2014-05-28 DIAGNOSIS — M2420 Disorder of ligament, unspecified site: Secondary | ICD-10-CM | POA: Diagnosis not present

## 2014-05-28 DIAGNOSIS — IMO0001 Reserved for inherently not codable concepts without codable children: Secondary | ICD-10-CM | POA: Diagnosis not present

## 2014-05-28 DIAGNOSIS — G20 Parkinson's disease: Secondary | ICD-10-CM | POA: Diagnosis not present

## 2014-05-28 DIAGNOSIS — R4189 Other symptoms and signs involving cognitive functions and awareness: Secondary | ICD-10-CM | POA: Insufficient documentation

## 2014-05-28 DIAGNOSIS — G20A1 Parkinson's disease without dyskinesia, without mention of fluctuations: Secondary | ICD-10-CM | POA: Insufficient documentation

## 2014-05-28 DIAGNOSIS — M629 Disorder of muscle, unspecified: Secondary | ICD-10-CM | POA: Diagnosis not present

## 2014-05-30 ENCOUNTER — Other Ambulatory Visit: Payer: Self-pay | Admitting: Internal Medicine

## 2014-05-31 ENCOUNTER — Other Ambulatory Visit: Payer: Self-pay | Admitting: Internal Medicine

## 2014-05-31 DIAGNOSIS — R339 Retention of urine, unspecified: Secondary | ICD-10-CM | POA: Diagnosis not present

## 2014-05-31 DIAGNOSIS — N319 Neuromuscular dysfunction of bladder, unspecified: Secondary | ICD-10-CM | POA: Diagnosis not present

## 2014-05-31 DIAGNOSIS — N3946 Mixed incontinence: Secondary | ICD-10-CM | POA: Diagnosis not present

## 2014-05-31 DIAGNOSIS — N281 Cyst of kidney, acquired: Secondary | ICD-10-CM | POA: Diagnosis not present

## 2014-06-02 ENCOUNTER — Ambulatory Visit: Payer: Medicare Other | Admitting: Physical Therapy

## 2014-06-02 DIAGNOSIS — IMO0001 Reserved for inherently not codable concepts without codable children: Secondary | ICD-10-CM | POA: Diagnosis not present

## 2014-06-04 ENCOUNTER — Ambulatory Visit (INDEPENDENT_AMBULATORY_CARE_PROVIDER_SITE_OTHER): Payer: Medicare Other | Admitting: Internal Medicine

## 2014-06-04 ENCOUNTER — Ambulatory Visit (INDEPENDENT_AMBULATORY_CARE_PROVIDER_SITE_OTHER): Payer: Medicare Other | Admitting: *Deleted

## 2014-06-04 ENCOUNTER — Encounter: Payer: Self-pay | Admitting: Internal Medicine

## 2014-06-04 VITALS — BP 136/80 | HR 82 | Temp 98.4°F | Resp 20 | Ht 70.0 in | Wt 206.0 lb

## 2014-06-04 DIAGNOSIS — N529 Male erectile dysfunction, unspecified: Secondary | ICD-10-CM | POA: Diagnosis not present

## 2014-06-04 DIAGNOSIS — I10 Essential (primary) hypertension: Secondary | ICD-10-CM

## 2014-06-04 DIAGNOSIS — E291 Testicular hypofunction: Secondary | ICD-10-CM | POA: Diagnosis not present

## 2014-06-04 DIAGNOSIS — G20 Parkinson's disease: Secondary | ICD-10-CM

## 2014-06-04 DIAGNOSIS — E349 Endocrine disorder, unspecified: Secondary | ICD-10-CM

## 2014-06-04 MED ORDER — TESTOSTERONE CYPIONATE 200 MG/ML IM SOLN
400.0000 mg | Freq: Once | INTRAMUSCULAR | Status: AC
  Administered 2014-06-04: 400 mg via INTRAMUSCULAR

## 2014-06-04 MED ORDER — TESTOSTERONE CYPIONATE 200 MG/ML IM SOLN: 400.0000 mg | INTRAMUSCULAR | Status: DC

## 2014-06-04 NOTE — Progress Notes (Signed)
Pre visit review using our clinic review tool, if applicable. No additional management support is needed unless otherwise documented below in the visit note. 

## 2014-06-04 NOTE — Progress Notes (Signed)
Subjective:    Patient ID: [name], male    DOB: 1945-10-17, 68 y.o.   MRN: [id number]  HPI  69 year old patient who has a history of hypertension.  He is followed closely by neurology due to parkinsonism and has had a recent evaluation.  He continues to have some fatigue.  He also has some concern about occasional low blood pressure readings. Recent laboratory studies were reviewed and discussed with the patient Blood pressure regimen includes losartan 100 mg daily.  No recent new medicines added to his regimen  Past Medical History  Diagnosis Date  . HTN (hypertension)   . Glaucoma   . OSA (obstructive sleep apnea)   . Allergic rhinitis   . Parkinsonism 01/16/2013  . Hyperlipidemia   . Thyroid disease     HYPOTHYROID  . Hypogonadism male   . Pre-diabetes     History   Social History  . Marital Status: Married    Spouse Name: linda    Number of Children: 2  . Years of Education: college   Occupational History  . branch Librarian, academic   .     Social History Main Topics  . Smoking status: Never Smoker   . Smokeless tobacco: Never Used  . Alcohol Use: Yes  . Drug Use: No  . Sexual Activity: Not on file   Other Topics Concern  . Not on file   Social History Narrative  . No narrative on file    Past Surgical History  Procedure Laterality Date  . Uvulopalatopharyngoplasty    . Lumbar disc surgery    . Cataract extraction      Family History  Problem Relation Age of Onset  . Emphysema Mother   . COPD Mother   . Heart disease Mother   . Liver cancer Father   . Cancer Father   . Diabetes Father   . Heart disease Sister   . COPD Sister     Allergies  Allergen Reactions  . Ambien [Zolpidem Tartrate]   . Atenolol   . Lunesta [Eszopiclone]   . Oxazepam   . Requip [Ropinirole Hcl]   . Ziac [Bisoprolol-Hydrochlorothiazide]     Current Outpatient Prescriptions on File Prior to Visit  Medication Sig Dispense Refill  . amitriptyline (ELAVIL)  25 MG tablet TAKE 1 TABLET BY MOUTH AT BEDTIME  90 tablet  1  . aspirin 81 MG tablet Take 81 mg by mouth daily.        . bimatoprost (LUMIGAN) 0.01 % SOLN Place 1 drop into both eyes at bedtime.        . Cholecalciferol (VITAMIN D3) 10000 UNITS capsule Take 10,000 Units by mouth. 3 times a week       . fexofenadine (ALLEGRA) 180 MG tablet Take 180 mg by mouth daily.        . fish oil-omega-3 fatty acids 1000 MG capsule Take by mouth daily. 2400 mg on Tuesday, Thursday, and saturday      . fluticasone (FLONASE) 50 MCG/ACT nasal spray USE 1-2 SPRAYS IN EACH NOSTRIL EVERY DAY  16 g  6  . GUAIFENESIN PO Take by mouth 3 (three) times daily.      Marland Kitchen levothyroxine (SYNTHROID, LEVOTHROID) 50 MCG tablet TAKE 1 TABLET EVERY DAY  90 tablet  1  . losartan (COZAAR) 100 MG tablet TAKE 1 TABLET BY MOUTH DAILY  30 tablet  5  . magnesium gluconate (MAGONATE) 500 MG tablet Take 500 mg by mouth as needed.      Marland Kitchen  Multiple Vitamins-Minerals (MULTIVITAMIN WITH MINERALS) tablet Take 1 tablet by mouth daily.        . Naproxen Sodium (ALEVE PO) Take by mouth as needed.      . tadalafil (CIALIS) 5 MG tablet Take 5 mg by mouth daily.      . tamsulosin (FLOMAX) 0.4 MG CAPS capsule Take 1 capsule (0.4 mg total) by mouth daily.  30 capsule  3  . testosterone cypionate (DEPO-TESTOSTERONE) 200 MG/ML injection Inject 2 mLs (400 mg total) into the muscle every 21 ( twenty-one) days.  10 mL  5   No current facility-administered medications on file prior to visit.    BP 136/80  Pulse 82  Temp(Src) 98.4 F (36.9 C) (Oral)  Resp 20  Ht 5\' 10"  (1.778 m)  Wt 206 lb (93.441 kg)  BMI 29.56 kg/m2     Review of Systems  Constitutional: Positive for fatigue. Negative for fever, chills and appetite change.  HENT: Negative for congestion, dental problem, ear pain, hearing loss, sore throat, tinnitus, trouble swallowing and voice change.   Eyes: Negative for pain, discharge and visual disturbance.  Respiratory: Negative for  cough, chest tightness, wheezing and stridor.   Cardiovascular: Negative for chest pain, palpitations and leg swelling.  Gastrointestinal: Negative for nausea, vomiting, abdominal pain, diarrhea, constipation, blood in stool and abdominal distention.  Genitourinary: Negative for urgency, hematuria, flank pain, discharge, difficulty urinating and genital sores.  Musculoskeletal: Positive for gait problem. Negative for arthralgias, back pain, joint swelling, myalgias and neck stiffness.  Skin: Negative for rash.  Neurological: Negative for dizziness, syncope, speech difficulty, weakness, numbness and headaches.  Hematological: Negative for adenopathy. Does not bruise/bleed easily.  Psychiatric/Behavioral: Negative for behavioral problems and dysphoric mood. The patient is not nervous/anxious.        Objective:   Physical Exam  Constitutional: He is oriented to person, place, and time. He appears well-developed.  Repeat blood pressure 122/80  HENT:  Head: Normocephalic.  Right Ear: External ear normal.  Left Ear: External ear normal.  Eyes: Conjunctivae and EOM are normal.  Neck: Normal range of motion.  Cardiovascular: Normal rate and normal heart sounds.   Pulmonary/Chest: Breath sounds normal.  Abdominal: Bowel sounds are normal.  Musculoskeletal: Normal range of motion. He exhibits no edema and no tenderness.  Neurological: He is alert and oriented to person, place, and time.  Psychiatric: He has a normal mood and affect. His behavior is normal.          Assessment & Plan:   Hypertension.  Blood pressure appears to be in a stable range.  He occasionally gets some low normal readings.  We'll continue observe on present regimen Parkinsonism.  Followup neurology Fatigue.  Recent lab reviewed.  Will observe at this time.  CPX scheduled

## 2014-06-04 NOTE — Patient Instructions (Signed)
Limit your sodium (Salt) intake    It is important that you exercise regularly, at least 20 minutes 3 to 4 times per week.  If you develop chest pain or shortness of breath seek  medical attention.  Call or return to clinic prn if these symptoms worsen or fail to improve as anticipated.  

## 2014-06-09 ENCOUNTER — Ambulatory Visit: Payer: Medicare Other | Attending: Nurse Practitioner | Admitting: Physical Therapy

## 2014-06-09 DIAGNOSIS — M2420 Disorder of ligament, unspecified site: Secondary | ICD-10-CM | POA: Diagnosis not present

## 2014-06-09 DIAGNOSIS — R4189 Other symptoms and signs involving cognitive functions and awareness: Secondary | ICD-10-CM | POA: Diagnosis not present

## 2014-06-09 DIAGNOSIS — IMO0001 Reserved for inherently not codable concepts without codable children: Secondary | ICD-10-CM | POA: Insufficient documentation

## 2014-06-09 DIAGNOSIS — G20 Parkinson's disease: Secondary | ICD-10-CM | POA: Diagnosis not present

## 2014-06-09 DIAGNOSIS — M629 Disorder of muscle, unspecified: Secondary | ICD-10-CM | POA: Diagnosis not present

## 2014-06-09 DIAGNOSIS — R279 Unspecified lack of coordination: Secondary | ICD-10-CM | POA: Insufficient documentation

## 2014-06-09 DIAGNOSIS — G20A1 Parkinson's disease without dyskinesia, without mention of fluctuations: Secondary | ICD-10-CM | POA: Insufficient documentation

## 2014-06-15 ENCOUNTER — Ambulatory Visit (INDEPENDENT_AMBULATORY_CARE_PROVIDER_SITE_OTHER)
Admission: RE | Admit: 2014-06-15 | Discharge: 2014-06-15 | Disposition: A | Discharge status: Home / Self Care | Payer: Medicare Other | Source: Ambulatory Visit | Attending: Internal Medicine | Admitting: Internal Medicine

## 2014-06-15 ENCOUNTER — Encounter: Payer: Self-pay | Admitting: Internal Medicine

## 2014-06-15 ENCOUNTER — Ambulatory Visit: Payer: Medicare Other | Admitting: Internal Medicine

## 2014-06-15 VITALS — BP 134/60 | HR 85 | Ht 70.0 in | Wt 207.8 lb

## 2014-06-15 DIAGNOSIS — J209 Acute bronchitis, unspecified: Secondary | ICD-10-CM | POA: Diagnosis not present

## 2014-06-15 DIAGNOSIS — I10 Essential (primary) hypertension: Secondary | ICD-10-CM | POA: Diagnosis not present

## 2014-06-15 DIAGNOSIS — R0989 Other specified symptoms and signs involving the circulatory and respiratory systems: Secondary | ICD-10-CM | POA: Diagnosis not present

## 2014-06-15 MED ORDER — LEVALBUTEROL HCL 0.63 MG/3ML IN NEBU
0.6300 mg | INHALATION_SOLUTION | Freq: Once | RESPIRATORY_TRACT | Status: AC
  Administered 2014-06-15: 0.63 mg via RESPIRATORY_TRACT

## 2014-06-15 NOTE — Progress Notes (Signed)
Subjective:    Patient ID: [name], male    DOB: 09-06-1946, 68 y.o.   MRN: [id number]  HPI 05/30/11- 65 yoM followed for allergic rhinitis, complicated by insomnia, glaucoma, HBP. Last here May 31, 2010-  Expects most problems in the [place]. Pollen levels have not started to surge yet, but he reports some nasal congestion and eyes watering. Minimal chest tightness mainly with bending over.I discussed this as related more to abdominal girth than to lung function.  Med list review- he seems unaware of loratadine or allegra despite discussion last year, and avoids allergy and sinus meds. . He asks about Agent Orange, assuming he was exposed, but not knowing or making a specific attribution. I referred him to the [place] for that consideration. He indicates concern that he is slowing down and forgetful. Neurology evaluation by Dr Doy Mince over a year ago was negative.   06/11/12-  40 yoM followed for allergic rhinitis, complicated by insomnia, glaucoma, HBP.  PCP Dr Melford Aase  Patient states allergies are worse than last year. c/o recent onset of cough with white mucus, wheezing, runny nose, post nasal drip, and itchy eyes. He denies reflux symptoms. Taking one half Zyrtec tablet at bedtime. A whole tablet too strong and overdry skin. Diagnosed with early Parkinson's disease. Was recent blood donor.  06/15/13- 55 yoM followed for allergic rhinitis, complicated by insomnia, glaucoma, HBP, Parkinson's.  PCP Dr Melford Aase FOLLOWS OEV:OJJKK up of allergies recently(been on Allegra and Mucinex past 6 weeks)feels like its helped; drainage is clear in color. Increased watery rhinorrhea. Tends to be worse in spring and fall pollen seasons. Has glaucoma.  06/15/14- 49 yoM followed for allergic rhinitis, complicated by insomnia, glaucoma, HBP, Parkinson's.  PCP Dr Melford Aase FOLLOWS FOR: alleriges have been good however patient states he has a cough-not thick and white/clear in color.    Review of Systems-  see HPI Constitutional:   No-   weight loss, night sweats, fevers, chills, fatigue, lassitude. HEENT:   No-  headaches, difficulty swallowing, tooth/dental problems, sore throat,       +  sneezing, itching, ear ache, nasal congestion, +post nasal drip,  CV:  No-   chest pain, orthopnea, PND, swelling in lower extremities, anasarca, dizziness, palpitations Resp: No-   shortness of breath with exertion or at rest.              No-   productive cough,  No non-productive cough,  No-  coughing up of blood.              No-   change in color of mucus.  No- wheezing.   Skin: No-   rash or lesions. GI:  No-   heartburn, indigestion, abdominal pain, nausea, vomiting,  GU:  MS:  No-   joint pain or swelling.  . Neuro- +some stiffness of movement:  Psych:  No- change in mood or affect. No depression or anxiety.  No memory loss.  Objective:   Physical Exam General- Alert, Oriented, Affect-appropriate, Distress- none acute Skin- rash-none, lesions- none, excoriation- none Lymphadenopathy- none Head- atraumatic            Eyes- Gross vision intact, PERRLA, conjunctivae clear secretions            Ears- Hearing, canals normal            Nose- Clear, No- Septal dev, mucus, polyps, erosion, perforation             Throat- +s/p UPPP, IV, +  tongue coated, +cobblestone pharynx , drainage- none, tonsils- atrophic Neck- flexible , trachea midline, no stridor , thyroid nl, carotid no bruit Chest - symmetrical excursion , unlabored           Heart/CV- RRR , no murmur , no gallop  , no rub, nl s1 s2                           - JVD- none , edema- none, stasis changes- none, varices- none           Lung- clear to P&A, wheeze- none, cough- none , dullness-none, rub- none           Chest wall-  Abd-  Br/ Gen/ Rectal- Not done, not indicated Extrem- cyanosis- none, clubbing, none, atrophy- none, strength- nl Neuro- + absent facial expression, no tremor  Assessment & Plan:

## 2014-06-15 NOTE — Patient Instructions (Signed)
Neb xop  -  Dx acute bronchitis  Order- CXR   Dx acute bronchitis  Please call as needed

## 2014-06-16 ENCOUNTER — Ambulatory Visit: Payer: Medicare Other | Admitting: Physical Therapy

## 2014-06-16 DIAGNOSIS — IMO0001 Reserved for inherently not codable concepts without codable children: Secondary | ICD-10-CM | POA: Diagnosis not present

## 2014-06-21 DIAGNOSIS — H1045 Other chronic allergic conjunctivitis: Secondary | ICD-10-CM | POA: Diagnosis not present

## 2014-06-23 ENCOUNTER — Ambulatory Visit: Payer: Medicare Other | Admitting: Physical Therapy

## 2014-06-23 DIAGNOSIS — IMO0001 Reserved for inherently not codable concepts without codable children: Secondary | ICD-10-CM | POA: Diagnosis not present

## 2014-06-25 ENCOUNTER — Ambulatory Visit (INDEPENDENT_AMBULATORY_CARE_PROVIDER_SITE_OTHER): Payer: Medicare Other | Admitting: *Deleted

## 2014-06-25 DIAGNOSIS — E291 Testicular hypofunction: Secondary | ICD-10-CM

## 2014-06-25 DIAGNOSIS — E349 Endocrine disorder, unspecified: Secondary | ICD-10-CM

## 2014-06-25 DIAGNOSIS — N529 Male erectile dysfunction, unspecified: Secondary | ICD-10-CM | POA: Diagnosis not present

## 2014-06-25 MED ORDER — TESTOSTERONE CYPIONATE 200 MG/ML IM SOLN
400.0000 mg | Freq: Once | INTRAMUSCULAR | Status: AC
  Administered 2014-06-25: 400 mg via INTRAMUSCULAR

## 2014-06-30 ENCOUNTER — Ambulatory Visit: Payer: Medicare Other | Admitting: Physical Therapy

## 2014-06-30 DIAGNOSIS — IMO0001 Reserved for inherently not codable concepts without codable children: Secondary | ICD-10-CM | POA: Diagnosis not present

## 2014-07-02 DIAGNOSIS — H04229 Epiphora due to insufficient drainage, unspecified lacrimal gland: Secondary | ICD-10-CM | POA: Diagnosis not present

## 2014-07-02 DIAGNOSIS — H409 Unspecified glaucoma: Secondary | ICD-10-CM | POA: Diagnosis not present

## 2014-07-02 DIAGNOSIS — H4011X0 Primary open-angle glaucoma, stage unspecified: Secondary | ICD-10-CM | POA: Diagnosis not present

## 2014-07-05 DIAGNOSIS — H409 Unspecified glaucoma: Secondary | ICD-10-CM | POA: Diagnosis not present

## 2014-07-05 DIAGNOSIS — H4011X0 Primary open-angle glaucoma, stage unspecified: Secondary | ICD-10-CM | POA: Diagnosis not present

## 2014-07-05 DIAGNOSIS — T887XXA Unspecified adverse effect of drug or medicament, initial encounter: Secondary | ICD-10-CM | POA: Diagnosis not present

## 2014-07-05 DIAGNOSIS — H10509 Unspecified blepharoconjunctivitis, unspecified eye: Secondary | ICD-10-CM | POA: Diagnosis not present

## 2014-07-06 ENCOUNTER — Other Ambulatory Visit: Payer: Self-pay | Admitting: Nurse Practitioner

## 2014-07-06 DIAGNOSIS — R296 Repeated falls: Secondary | ICD-10-CM

## 2014-07-06 DIAGNOSIS — R269 Unspecified abnormalities of gait and mobility: Secondary | ICD-10-CM

## 2014-07-06 DIAGNOSIS — G20C Parkinsonism, unspecified: Secondary | ICD-10-CM

## 2014-07-06 DIAGNOSIS — G20 Parkinson's disease: Secondary | ICD-10-CM

## 2014-07-06 DIAGNOSIS — R1310 Dysphagia, unspecified: Secondary | ICD-10-CM

## 2014-07-11 DIAGNOSIS — Z23 Encounter for immunization: Secondary | ICD-10-CM | POA: Diagnosis not present

## 2014-07-15 ENCOUNTER — Ambulatory Visit (INDEPENDENT_AMBULATORY_CARE_PROVIDER_SITE_OTHER): Payer: Medicare Other | Admitting: *Deleted

## 2014-07-15 DIAGNOSIS — E291 Testicular hypofunction: Secondary | ICD-10-CM

## 2014-07-15 DIAGNOSIS — E349 Endocrine disorder, unspecified: Secondary | ICD-10-CM

## 2014-07-16 MED ORDER — TESTOSTERONE CYPIONATE 200 MG/ML IM SOLN
200.0000 mg | Freq: Once | INTRAMUSCULAR | Status: AC
  Administered 2014-07-15: 200 mg via INTRAMUSCULAR

## 2014-08-02 ENCOUNTER — Other Ambulatory Visit: Payer: Self-pay | Admitting: Neurology

## 2014-08-03 DIAGNOSIS — Z85828 Personal history of other malignant neoplasm of skin: Secondary | ICD-10-CM | POA: Diagnosis not present

## 2014-08-03 DIAGNOSIS — D2261 Melanocytic nevi of right upper limb, including shoulder: Secondary | ICD-10-CM | POA: Diagnosis not present

## 2014-08-03 DIAGNOSIS — D225 Melanocytic nevi of trunk: Secondary | ICD-10-CM | POA: Diagnosis not present

## 2014-08-04 DIAGNOSIS — M545 Low back pain: Secondary | ICD-10-CM | POA: Diagnosis not present

## 2014-08-05 ENCOUNTER — Ambulatory Visit: Payer: Medicare Other | Attending: Nurse Practitioner | Admitting: Occupational Therapy

## 2014-08-05 ENCOUNTER — Telehealth: Payer: Self-pay

## 2014-08-05 ENCOUNTER — Ambulatory Visit (INDEPENDENT_AMBULATORY_CARE_PROVIDER_SITE_OTHER): Payer: Medicare Other | Admitting: *Deleted

## 2014-08-05 DIAGNOSIS — R258 Other abnormal involuntary movements: Secondary | ICD-10-CM | POA: Insufficient documentation

## 2014-08-05 DIAGNOSIS — R4189 Other symptoms and signs involving cognitive functions and awareness: Secondary | ICD-10-CM | POA: Insufficient documentation

## 2014-08-05 DIAGNOSIS — E291 Testicular hypofunction: Secondary | ICD-10-CM | POA: Diagnosis not present

## 2014-08-05 DIAGNOSIS — Z5189 Encounter for other specified aftercare: Secondary | ICD-10-CM | POA: Diagnosis not present

## 2014-08-05 DIAGNOSIS — G20 Parkinson's disease: Secondary | ICD-10-CM

## 2014-08-05 DIAGNOSIS — R5383 Other fatigue: Secondary | ICD-10-CM

## 2014-08-05 DIAGNOSIS — R279 Unspecified lack of coordination: Secondary | ICD-10-CM | POA: Insufficient documentation

## 2014-08-05 DIAGNOSIS — G20C Parkinsonism, unspecified: Secondary | ICD-10-CM

## 2014-08-05 DIAGNOSIS — I10 Essential (primary) hypertension: Secondary | ICD-10-CM

## 2014-08-05 DIAGNOSIS — E349 Endocrine disorder, unspecified: Secondary | ICD-10-CM

## 2014-08-05 MED ORDER — TESTOSTERONE CYPIONATE 200 MG/ML IM SOLN
400.0000 mg | Freq: Once | INTRAMUSCULAR | Status: AC
  Administered 2014-08-05: 400 mg via INTRAMUSCULAR

## 2014-08-05 NOTE — Telephone Encounter (Signed)
Depends on what cardiac problem of concern; no reservations about any [place] cardiologist

## 2014-08-05 NOTE — Telephone Encounter (Signed)
Spoke to pt, told him Dr.K does not have a particular cardio physician, any of the [facility]. Pt verbalized understanding and would like a referral. Told pt okay will do referral to Cardio and someone will contact him for an appointment. Pt verbalized understanding. Cardiology referral done.

## 2014-08-05 NOTE — Telephone Encounter (Signed)
Pt would like to know who Dr. Raliegh Ip would recommend for a cardiologist. Pls advise.

## 2014-08-06 DIAGNOSIS — H01005 Unspecified blepharitis left lower eyelid: Secondary | ICD-10-CM | POA: Diagnosis not present

## 2014-08-06 DIAGNOSIS — H01002 Unspecified blepharitis right lower eyelid: Secondary | ICD-10-CM | POA: Diagnosis not present

## 2014-08-06 DIAGNOSIS — H4011X3 Primary open-angle glaucoma, severe stage: Secondary | ICD-10-CM | POA: Diagnosis not present

## 2014-08-11 ENCOUNTER — Encounter: Payer: Self-pay | Admitting: Internal Medicine

## 2014-08-11 DIAGNOSIS — M5030 Other cervical disc degeneration, unspecified cervical region: Secondary | ICD-10-CM | POA: Diagnosis not present

## 2014-08-11 DIAGNOSIS — M542 Cervicalgia: Secondary | ICD-10-CM | POA: Diagnosis not present

## 2014-08-19 DIAGNOSIS — Z6829 Body mass index (BMI) 29.0-29.9, adult: Secondary | ICD-10-CM | POA: Diagnosis not present

## 2014-08-19 DIAGNOSIS — R269 Unspecified abnormalities of gait and mobility: Secondary | ICD-10-CM | POA: Diagnosis not present

## 2014-08-26 ENCOUNTER — Ambulatory Visit (INDEPENDENT_AMBULATORY_CARE_PROVIDER_SITE_OTHER): Payer: Medicare Other | Admitting: *Deleted

## 2014-08-26 DIAGNOSIS — E291 Testicular hypofunction: Secondary | ICD-10-CM | POA: Diagnosis not present

## 2014-08-26 DIAGNOSIS — E349 Endocrine disorder, unspecified: Secondary | ICD-10-CM

## 2014-08-26 MED ORDER — TESTOSTERONE CYPIONATE 200 MG/ML IM SOLN
400.0000 mg | INTRAMUSCULAR | Status: DC
  Administered 2014-08-26: 400 mg via INTRAMUSCULAR

## 2014-08-26 MED ORDER — TESTOSTERONE CYPIONATE 200 MG/ML IM SOLN: 200.0000 mg | INTRAMUSCULAR | Status: DC

## 2014-09-06 ENCOUNTER — Ambulatory Visit (INDEPENDENT_AMBULATORY_CARE_PROVIDER_SITE_OTHER): Payer: Medicare Other | Admitting: Cardiology

## 2014-09-06 ENCOUNTER — Encounter: Payer: Self-pay | Admitting: Cardiology

## 2014-09-06 ENCOUNTER — Encounter: Payer: Self-pay | Admitting: Occupational Therapy

## 2014-09-06 ENCOUNTER — Ambulatory Visit: Payer: Medicare Other | Attending: Nurse Practitioner | Admitting: Occupational Therapy

## 2014-09-06 VITALS — BP 140/90 | HR 67 | Ht 70.0 in | Wt 204.3 lb

## 2014-09-06 DIAGNOSIS — R258 Other abnormal involuntary movements: Secondary | ICD-10-CM | POA: Insufficient documentation

## 2014-09-06 DIAGNOSIS — I10 Essential (primary) hypertension: Secondary | ICD-10-CM | POA: Diagnosis not present

## 2014-09-06 DIAGNOSIS — R0609 Other forms of dyspnea: Secondary | ICD-10-CM

## 2014-09-06 DIAGNOSIS — Z5189 Encounter for other specified aftercare: Secondary | ICD-10-CM | POA: Diagnosis not present

## 2014-09-06 DIAGNOSIS — T733XXA Exhaustion due to excessive exertion, initial encounter: Secondary | ICD-10-CM

## 2014-09-06 DIAGNOSIS — R29898 Other symptoms and signs involving the musculoskeletal system: Secondary | ICD-10-CM

## 2014-09-06 DIAGNOSIS — R279 Unspecified lack of coordination: Secondary | ICD-10-CM | POA: Insufficient documentation

## 2014-09-06 DIAGNOSIS — G20 Parkinson's disease: Secondary | ICD-10-CM | POA: Insufficient documentation

## 2014-09-06 DIAGNOSIS — R4189 Other symptoms and signs involving cognitive functions and awareness: Secondary | ICD-10-CM | POA: Insufficient documentation

## 2014-09-06 NOTE — Therapy (Signed)
Occupational Therapy Treatment  Patient Details  Name: [name] MRN: [id number] Date of Birth: 10/01/1946  Encounter Date: 09/06/2014      OT End of Session - 09/06/14 1728    Visit Number 2  2/10 G   Number of Visits 9   Date for OT Re-Evaluation 10/04/14   Authorization Type 1Medicare, 2BCBS   OT Start Time 1450   OT Stop Time 1530   OT Time Calculation (min) 40 min   Activity Tolerance Patient tolerated treatment well      Past Medical History  Diagnosis Date  . HTN (hypertension)   . Glaucoma   . OSA (obstructive sleep apnea)   . Allergic rhinitis   . Parkinsonism 01/16/2013  . Hyperlipidemia   . Thyroid disease     HYPOTHYROID  . Hypogonadism male   . Pre-diabetes     Past Surgical History  Procedure Laterality Date  . Uvulopalatopharyngoplasty    . Lumbar disc surgery    . Cataract extraction      There were no vitals taken for this visit.  Visit Diagnosis:  Bradykinesia  Cognitive deficits  Lack of coordination  Rigidity      Subjective Assessment - 09/06/14 1455    Symptoms Went to Cardiologist this morning.  Pt may have had a MI.  Pt to have an EKG and stress test soon.  Fell yesterday getting out of recliner yesterday.  Pt had 3 falls in last week.   Currently in Pain? No/denies            OT Treatments/Exercises (OP) - 09/06/14 0001    ADLs   Grooming simulated grooming tasks (brushing teeth/shaving) by "drawing" lines to fill spaces in various with big movements with use of visual targets with mod cues initially then min cues.   Bathing washing hands with hand gel with mod cues for big movements   Functional Mobility Pt instructed in how to break down task for getting out doors with something in hand (place items on table before opening doors).   Home Maintenance Opening/closing lids of various sizes with mod cues for big movements          OT Education - 09/06/14 1521    Education provided Yes   Education Details Big  Movements with ADLs/strategies for ADLs   Person(s) Educated Patient   Methods Explanation;Demonstration;Verbal cues;Tactile cues;Handout   Comprehension Verbalized understanding;Returned demonstration;Verbal cues required;Tactile cues required            OT Long Term Goals - 09/06/14 1352    OT LONG TERM GOAL #1   Title Pt will be independent with updated HEP prn.   Time --  10/01/14   Status On-going  extend due as pt has not been seen   OT LONG TERM GOAL #2   Title Pt will verbalize understanding of adative strategies for ADLs prn.   Time --  10/01/14   Status On-going  extend due as pt has not been seen   OT LONG TERM GOAL #3   Title Pt will demo/report increased ease with shaving, brushing teeth, putting lids on containers, and washing hands.   Time --  10/01/14   Status On-going  extend due as pt has not been seen   OT LONG TERM GOAL #4   Title Pt will improve standing functional reach with each UE by at least 2 inches for improved balance for ADLs.   Time --  10/01/14   Status On-going  extend due as  pt has not been seen   OT LONG TERM GOAL #5   Title Pt will improve UE functional reaching/coordination for ADLs as shown by improving score on box an blocks test by at least 4 bilaterally.   Time --  10/01/14   Status On-going  extend due as pt has not been seen          Plan - 09/06/14 1729    Clinical Impression Statement pt needs consistent cueing and reinforcement for timing and size of movement   Rehab Potential Good   OT Frequency 2x / week   OT Duration 4 weeks   Plan continue with big movements, dynamic functional reaching        Problem List Patient Active Problem List   Diagnosis Date Noted  . DOE (dyspnea on exertion) 09/06/2014  . Fatigue due to excessive exertion 09/06/2014  . Testosterone deficiency 11/12/2013  . Routine general medical examination at a health care facility 08/30/2013  . Mixed hyperlipidemia 08/30/2013  . Other  abnormal glucose 08/30/2013  . 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 Parkinsonism 01/16/2013  . INSOMNIA 06/06/2008  . GLAUCOMA 06/01/2008  . Essential hypertension 06/01/2008  . Seasonal and perennial allergic rhinitis 06/01/2008                                             Vianne Bulls, OTR/L 09/06/2014 5:31 PM Phone: [phone] Fax: [phone]    [facility] 09/06/2014, 5:31 PM

## 2014-09-06 NOTE — Patient Instructions (Addendum)
  Performing Daily Activities with Big Movements  Pick at least one activity a day and perform with BIG, SLOW, DELIBERATE movements/effort. This can make the activity easier and turn daily activities into exercise!  If you are standing during the activity, make sure to keep feet apart and stand with good/big/PWR! UP posture.  Examples:  Dressing - Push arms in sleeves, twist when putting on jacket, push foot into pants, open hands to pull down shirt/put on socks/pull up pants  Bathing - Wash/dry with long strokes  Brushing your teeth - Big, slow movements  Cutting food - Long deliberate cuts  Opening jar/bottle - Move as much as you can with each turn  Picking up a cup/bottle - Open hand up big and get object all the way in palm  Hanging up clothes/getting clothes down from closet - Reach with big effort  Putting away groceries/dishes - Reach with big effort  Wiping counter/table - Move in big, long strokes  Stirring while cooking - Exaggerate movement  Cleaning windows - Move in big, long strokes  Sweeping - Move arms in big, long strokes  Vacuuming - Push with big movement  Folding clothes - Exaggerate arm movements  Washing car - Move in big, long strokes  Raking - Move arms in big, long strokes  Changing light bulb - Move as much as you can with each turn  Using a screwdriver - Move as much as you can with each turn  Walking into a store/restaurant - Walk with big steps, swing arms if able  Standing up from a chair/recliner/sofa - Scoot forward, lean forward, and stand with big effort   Brush teeth using slow, big movements  When leaving the house, walk to the door.  Place things in your hand on the table.  Open the doors.  Then pick up you items/coffee.

## 2014-09-06 NOTE — Progress Notes (Signed)
[address] [place], [place]  [address] Phone: [phone] Fax:  [phone]  Date:  09/06/2014   ID:  [name], DOB [birth year], MRN [id number]  PCP:  Nyoka Cowden, MD  Cardiologist:  [name], MD    History of Present Illness: [name] is a 69 y.o. male with a history of HTN, OSA, Parkinson's, hyperlipidemia, pre- diabetes and hypothyroidism who presents today for evaluation of HTN, fatigued and SOB.  He says that the SOB has been occurring for about a year and had gotten worse recently.  He notices it when he bends over to tie his shoes.  He has noticed fatigue when he exerts himself but denies any chest pain.  He has had some LE edema over the past year as well.  He denies any palpitations but does get dizzy when standing for long periods of time.   Wt Readings from Last 3 Encounters:  09/06/14 204 lb 4.8 oz (92.67 kg)  06/15/14 207 lb 12.8 oz (94.257 kg)  06/04/14 206 lb (93.441 kg)     Past Medical History  Diagnosis Date  . HTN (hypertension)   . Glaucoma   . OSA (obstructive sleep apnea)   . Allergic rhinitis   . Parkinsonism 01/16/2013  . Hyperlipidemia   . Thyroid disease     HYPOTHYROID  . Hypogonadism male   . Pre-diabetes     Current Outpatient Prescriptions  Medication Sig Dispense Refill  . amitriptyline (ELAVIL) 25 MG tablet TAKE 1 TABLET BY MOUTH AT BEDTIME 90 tablet 1  . aspirin 81 MG tablet Take 81 mg by mouth daily.      . Cholecalciferol (VITAMIN D3) 10000 UNITS capsule Take 5,000 Units by mouth. 3 times a week    . fexofenadine (ALLEGRA) 180 MG tablet Take 60 mg by mouth daily.     . fish oil-omega-3 fatty acids 1000 MG capsule Take 1,200 mg by mouth daily. 2400 mg on Tuesday, Thursday, and saturday    . fluticasone (FLONASE) 50 MCG/ACT nasal spray USE 1-2 SPRAYS IN EACH NOSTRIL EVERY DAY 16 g 6  . GUAIFENESIN PO Take 400 mg by mouth 3 (three) times daily as needed.     Marland Kitchen levothyroxine (SYNTHROID, LEVOTHROID) 50  MCG tablet TAKE 1 TABLET EVERY DAY 90 tablet 1  . losartan (COZAAR) 100 MG tablet TAKE 1 TABLET BY MOUTH DAILY 30 tablet 5  . magnesium gluconate (MAGONATE) 500 MG tablet Take 1,000 mg by mouth daily.     . Multiple Vitamins-Minerals (MULTIVITAMIN WITH MINERALS) tablet Take 1 tablet by mouth daily.      . tadalafil (CIALIS) 5 MG tablet Take 5 mg by mouth daily.    Marland Kitchen testosterone cypionate (DEPO-TESTOSTERONE) 200 MG/ML injection Inject 2 mLs (400 mg total) into the muscle every 21 ( twenty-one) days. 10 mL 5  . timolol (TIMOPTIC) 0.5 % ophthalmic solution Place 1 drop into both eyes.   12  . Naproxen Sodium (ALEVE PO) Take by mouth as needed.     No current facility-administered medications for this visit.    Allergies:    Allergies  Allergen Reactions  . Ambien [Zolpidem Tartrate]   . Atenolol   . Lunesta [Eszopiclone]   . Oxazepam   . Requip [Ropinirole Hcl]   . Ziac [Bisoprolol-Hydrochlorothiazide]     Social History:  The patient  reports that he has never smoked. He has never used smokeless tobacco. He reports that he drinks alcohol. He reports that he does not  use illicit drugs.   Family History:  The patient's family history includes COPD in his mother and sister; Cancer in his father; Diabetes in his father; Emphysema in his mother; Heart disease in his mother and sister; Liver cancer in his father.   ROS:  Please see the history of present illness.      All other systems reviewed and negative.   PHYSICAL EXAM: VS:  BP 140/90 mmHg  Pulse 67  Ht 5\' 10"  (1.778 m)  Wt 204 lb 4.8 oz (92.67 kg)  BMI 29.31 kg/m2 Well nourished, well developed, in no acute distress HEENT: normal Neck: no JVD Cardiac:  normal S1, S2; RRR; no murmur Lungs:  clear to auscultation bilaterally, no wheezing, rhonchi or rales Abd: soft, nontender, no hepatomegaly Ext: no edema Skin: warm and dry Neuro:  CNs 2-12 intact, no focal abnormalities noted  EKG:     NSR with inferior infarct and T wave  inversions inferiorly  ASSESSMENT AND PLAN:  1. DOE of unclear etiology ? EKG is abnormal and concerning for prior inferior infarct.  His CRF's include HTN, male sex, age , family history of CAD and dyslipidemia.  I will get a 2D echo to assess LVF. 2. HTN with borderline control.  Continue Losartan 3. Exertional fatigue ? Anginal equivalent or related to Parkinson's.  I will get a Lexiscan myoview to rule out ischemia  Followup with me pending results of studies.  Signed, [name], MD [facility] 09/06/2014 11:56 AM

## 2014-09-06 NOTE — Patient Instructions (Addendum)
Your physician has requested that you have a lexiscan myoview. For further information please visit [email or web address]. Please follow instruction sheet, as given.  Your physician has requested that you have an echocardiogram. Echocardiography is a painless test that uses sound waves to create images of your heart. It provides your doctor with information about the size and shape of your heart and how well your heart's chambers and valves are working. This procedure takes approximately one hour. There are no restrictions for this procedure.  Your physician recommends that you schedule a follow-up appointment AS NEEDED with Dr. [name].

## 2014-09-07 ENCOUNTER — Ambulatory Visit (HOSPITAL_COMMUNITY): Payer: Medicare Other | Attending: Cardiology | Admitting: Radiology

## 2014-09-07 VITALS — BP 119/78 | Ht 70.0 in | Wt 205.0 lb

## 2014-09-07 DIAGNOSIS — R0609 Other forms of dyspnea: Secondary | ICD-10-CM | POA: Diagnosis not present

## 2014-09-07 DIAGNOSIS — I10 Essential (primary) hypertension: Secondary | ICD-10-CM | POA: Insufficient documentation

## 2014-09-07 DIAGNOSIS — R5383 Other fatigue: Secondary | ICD-10-CM | POA: Insufficient documentation

## 2014-09-07 DIAGNOSIS — R9431 Abnormal electrocardiogram [ECG] [EKG]: Secondary | ICD-10-CM | POA: Diagnosis not present

## 2014-09-07 DIAGNOSIS — Z6829 Body mass index (BMI) 29.0-29.9, adult: Secondary | ICD-10-CM | POA: Diagnosis not present

## 2014-09-07 DIAGNOSIS — T733XXA Exhaustion due to excessive exertion, initial encounter: Secondary | ICD-10-CM

## 2014-09-07 DIAGNOSIS — R42 Dizziness and giddiness: Secondary | ICD-10-CM | POA: Diagnosis not present

## 2014-09-07 DIAGNOSIS — R0602 Shortness of breath: Secondary | ICD-10-CM

## 2014-09-07 MED ORDER — AMINOPHYLLINE 25 MG/ML IV SOLN
75.0000 mg | Freq: Once | INTRAVENOUS | Status: AC
  Administered 2014-09-07: 75 mg via INTRAVENOUS

## 2014-09-07 MED ORDER — TECHNETIUM TC 99M SESTAMIBI GENERIC - CARDIOLITE
33.0000 | Freq: Once | INTRAVENOUS | Status: AC | PRN
  Administered 2014-09-07: 33 via INTRAVENOUS

## 2014-09-07 MED ORDER — REGADENOSON 0.4 MG/5ML IV SOLN
0.4000 mg | Freq: Once | INTRAVENOUS | Status: AC
  Administered 2014-09-07: 0.4 mg via INTRAVENOUS

## 2014-09-07 MED ORDER — TECHNETIUM TC 99M SESTAMIBI GENERIC - CARDIOLITE
11.0000 | Freq: Once | INTRAVENOUS | Status: AC | PRN
  Administered 2014-09-07: 11 via INTRAVENOUS

## 2014-09-07 NOTE — Progress Notes (Signed)
[facility] [address] [place], [place] [address] [phone]    Cardiology Nuclear Med Study  [name] is a 68 y.o. male     MRN : [id number]     DOB: 1946-01-15  Procedure Date: 09/07/2014  Nuclear Med Background Indication for Stress Test:  Evaluation for Ischemia and Abnormal EKG History:  n/a Cardiac Risk Factors: Hypertension and Lipids  Symptoms:  Dizziness, DOE, Fatigue and Fatigue with Exertion   Nuclear Pre-Procedure Caffeine/Decaff Intake:  None> 12 hrs NPO After: 7:30pm   Lungs:  clear O2 Sat: 97% on room air. IV 0.9% NS with Angio Cath:  22g  IV Site: R Antecubital x 1, tolerated well IV Started by:  Irven [place], RN  Chest Size (in):  44 Cup Size: n/a  Height: 5\' 10"  (1.778 m)  Weight:  205 lb (92.987 kg)  BMI:  Body mass index is 29.41 kg/(m^2). Tech Comments:  Aminophylline 75 mg IV given for symptoms. All were resolved before leaving.    Nuclear Med Study 1 or 2 day study: 1 day  Stress Test Type:  Lexiscan  Reading MD: N/A  Order Authorizing Provider:  [name], MD  Resting Radionuclide: Technetium 33m Sestamibi  Resting Radionuclide Dose: 11.0 mCi   Stress Radionuclide:  Technetium 31m Sestamibi  Stress Radionuclide Dose: 33.0 mCi           Stress Protocol Rest HR: 62 Stress HR: 99  Rest BP: 119/78 Stress BP: 132/69  Exercise Time (min): n/a METS: n/a   Predicted Max HR: 153 bpm % Max HR: 64.71 bpm Rate Pressure Product: 13068   Dose of Adenosine (mg):  n/a Dose of Lexiscan: 0.4 mg  Dose of Atropine (mg): n/a Dose of Dobutamine: n/a mcg/kg/min (at max HR)  Stress Test Technologist: Perrin Maltese, EMT-P  Nuclear Technologist:  Earl Many, CNMT     Rest Procedure:  Myocardial perfusion imaging was performed at rest 45 minutes following the intravenous administration of Technetium 63m Sestamibi. Rest ECG: NSR - Normal EKG  Stress Procedure:  The patient received IV Lexiscan 0.4 mg over  15-seconds.  Technetium 30m Sestamibi injected at 30-seconds. This patient had sob, headache, and felt weird with the Lexiscan injection. Quantitative spect images were obtained after a 45 minute delay. Stress ECG: No significant change from baseline ECG  QPS Raw Data Images:  Normal; no motion artifact; normal heart/lung ratio. Stress Images:  Normal homogeneous uptake in all areas of the myocardium. Rest Images:  Normal homogeneous uptake in all areas of the myocardium. Subtraction (SDS):  No evidence of ischemia. Transient Ischemic Dilatation (Normal <1.22):  0.79 Lung/Heart Ratio (Normal <0.45):  0.24  Quantitative Gated Spect Images QGS EDV:  90 ml QGS ESV:  39 ml  Impression Exercise Capacity:  Lexiscan with no exercise. BP Response:  Normal blood pressure response. Clinical Symptoms:  No chest pain. ECG Impression:  No significant ST segment change suggestive of ischemia. Comparison with Prior Nuclear Study: No previous nuclear study performed  Overall Impression:  Normal stress nuclear study.  LV Ejection Fraction: 57%.  LV Wall Motion:  NL LV Function; NL Wall Motion   Darlin Coco MD

## 2014-09-08 ENCOUNTER — Telehealth: Payer: Self-pay | Admitting: Cardiology

## 2014-09-08 NOTE — Telephone Encounter (Signed)
Patient informed of stress test results and verbal understanding expressed.

## 2014-09-08 NOTE — Telephone Encounter (Signed)
New problem ° ° °Pt returning call from nurse. °

## 2014-09-09 ENCOUNTER — Ambulatory Visit (HOSPITAL_COMMUNITY): Payer: Medicare Other | Attending: Cardiology

## 2014-09-09 ENCOUNTER — Ambulatory Visit: Payer: Medicare Other | Attending: Nurse Practitioner | Admitting: Occupational Therapy

## 2014-09-09 DIAGNOSIS — H409 Unspecified glaucoma: Secondary | ICD-10-CM | POA: Diagnosis not present

## 2014-09-09 DIAGNOSIS — R0609 Other forms of dyspnea: Secondary | ICD-10-CM | POA: Insufficient documentation

## 2014-09-09 DIAGNOSIS — E785 Hyperlipidemia, unspecified: Secondary | ICD-10-CM | POA: Insufficient documentation

## 2014-09-09 DIAGNOSIS — I10 Essential (primary) hypertension: Secondary | ICD-10-CM | POA: Insufficient documentation

## 2014-09-09 DIAGNOSIS — G20 Parkinson's disease: Secondary | ICD-10-CM | POA: Insufficient documentation

## 2014-09-09 DIAGNOSIS — R4189 Other symptoms and signs involving cognitive functions and awareness: Secondary | ICD-10-CM

## 2014-09-09 DIAGNOSIS — R5383 Other fatigue: Secondary | ICD-10-CM | POA: Diagnosis not present

## 2014-09-09 DIAGNOSIS — R279 Unspecified lack of coordination: Secondary | ICD-10-CM | POA: Diagnosis not present

## 2014-09-09 DIAGNOSIS — R29898 Other symptoms and signs involving the musculoskeletal system: Secondary | ICD-10-CM

## 2014-09-09 DIAGNOSIS — G4700 Insomnia, unspecified: Secondary | ICD-10-CM | POA: Diagnosis not present

## 2014-09-09 DIAGNOSIS — R258 Other abnormal involuntary movements: Secondary | ICD-10-CM | POA: Insufficient documentation

## 2014-09-09 NOTE — Therapy (Signed)
[facility] [address] [place], [place], [address] Phone: [phone]   Fax:  [phone]  Occupational Therapy Treatment  Patient Details  Name: [name] MRN: [id number] Date of Birth: 1946-09-11  Encounter Date: 09/09/2014      OT End of Session - 09/09/14 1442    Visit Number 3   Number of Visits 9   Date for OT Re-Evaluation 10/04/14   Authorization Type 1Medicare, 2BCBS   OT Start Time 1408   OT Stop Time 1447   OT Time Calculation (min) 39 min   Activity Tolerance Patient tolerated treatment well      Past Medical History  Diagnosis Date  . HTN (hypertension)   . Glaucoma   . OSA (obstructive sleep apnea)   . Allergic rhinitis   . Parkinsonism 01/16/2013  . Hyperlipidemia   . Thyroid disease     HYPOTHYROID  . Hypogonadism male   . Pre-diabetes     Past Surgical History  Procedure Laterality Date  . Uvulopalatopharyngoplasty    . Lumbar disc surgery    . Cataract extraction      There were no vitals taken for this visit.  Visit Diagnosis:  No diagnosis found.      Subjective Assessment - 09/09/14 1420    Currently in Pain? No/denies                    Plan - 09/09/14 1708    Clinical Impression Statement Pt practiced donning shoes and tying, min vc. for positioning. Pt practiced donning doffing jacket using big movements/ effort. Dynamic reaching with trunk rotation for bilateral UE's. fine motor coordination activities, flipping playing cards and picking up coins, min difficulty/ v.c. Opening containers with emphasis on Big effort/ movements.   Rehab Potential Good   OT Frequency 2x / week   OT Duration 4 weeks   Plan big movements, dynamic functional reaching     Pt reports participating in cycling class at the gym and lifting weights, therapist recommended pt discuss these activities with his cardiologist when he sees her next week, prior to  resuming.                          Problem List Patient Active Problem List   Diagnosis Date Noted  . DOE (dyspnea on exertion) 09/06/2014  . Fatigue due to excessive exertion 09/06/2014  . Testosterone deficiency 11/12/2013  . Routine general medical examination at a health care facility 08/30/2013  . Mixed hyperlipidemia 08/30/2013  . Other abnormal glucose 08/30/2013  . 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 Parkinsonism 01/16/2013  . INSOMNIA 06/06/2008  . GLAUCOMA 06/01/2008  . Essential hypertension 06/01/2008  . Seasonal and perennial allergic rhinitis 06/01/2008    Chrishelle Zito 09/09/2014, 5:27 PM Theone Murdoch, OTR/L [phone] Phone: [phone] 5:27 PM 09/09/2014

## 2014-09-09 NOTE — Progress Notes (Signed)
2D Echo completed. 09/09/2014 

## 2014-09-10 ENCOUNTER — Encounter: Payer: Self-pay | Admitting: Internal Medicine

## 2014-09-10 ENCOUNTER — Other Ambulatory Visit: Payer: Self-pay

## 2014-09-10 MED ORDER — NEBIVOLOL HCL 2.5 MG PO TABS: 2.5000 mg | ORAL_TABLET | Freq: Every day | ORAL | Status: DC

## 2014-09-10 NOTE — Progress Notes (Signed)
Patient's wife wants to know if he continues Losartan or  Not? Was on Bystolic 10mg  at one time

## 2014-09-13 ENCOUNTER — Ambulatory Visit: Payer: Medicare Other | Admitting: Occupational Therapy

## 2014-09-13 ENCOUNTER — Encounter: Payer: Self-pay | Admitting: Occupational Therapy

## 2014-09-13 DIAGNOSIS — R4189 Other symptoms and signs involving cognitive functions and awareness: Secondary | ICD-10-CM | POA: Diagnosis not present

## 2014-09-13 DIAGNOSIS — G20 Parkinson's disease: Secondary | ICD-10-CM | POA: Diagnosis not present

## 2014-09-13 DIAGNOSIS — R29898 Other symptoms and signs involving the musculoskeletal system: Secondary | ICD-10-CM

## 2014-09-13 DIAGNOSIS — R258 Other abnormal involuntary movements: Secondary | ICD-10-CM

## 2014-09-13 DIAGNOSIS — R279 Unspecified lack of coordination: Secondary | ICD-10-CM

## 2014-09-13 NOTE — Patient Instructions (Addendum)
Finger and Thumb Extension (Resistive Putty)   With thumb and all fingers in center of putty donut, stretch out. Repeat 5-10 times. Do 1 sessions per day.    Coordination Exercises  Perform the following exercises for 20  minutes 1 times per day. Perform with both hand(s). Perform using big movements.  Move slow and deliberate opening the hand up after each motion.      Flipping Cards: Place deck of cards on the table. Flip cards over by opening your hand big to grasp and then turn your palm up big.  Deal cards: Hold 1/2 or whole deck in your hand. Use thumb to push card off top of deck with one big push.  Pick up coins and place in coin bank or container: Pick up with big, intentional movements. Do not drag coin to the edge.  Perform "Flicks"/hand stretches (PWR! Hands): Close hands then flick out your fingers with focus on opening hands, pulling wrists back, and extending elbows like you are pushing.  Fasten nuts/bolts or put on bottle caps: Turn as much/as big as you can with each turn.   Hold garbage bag by one end.  Pull bag into palm by extending your fingers as much as you can.

## 2014-09-13 NOTE — Therapy (Signed)
[facility] [address] [place], [place], [address] Phone: [phone]   Fax:  [phone]  Occupational Therapy Treatment  Patient Details  Name: [name] MRN: [id number] Date of Birth: Jan 10, 1946  Encounter Date: 09/13/2014      OT End of Session - 09/13/14 1701    Visit Number 4  4/10 G   Number of Visits 9   Date for OT Re-Evaluation 10/04/14   Authorization Type 1Medicare, 2BCBS   OT Start Time 0626   OT Stop Time 1400   OT Time Calculation (min) 43 min   Activity Tolerance Patient tolerated treatment well      Past Medical History  Diagnosis Date  . HTN (hypertension)   . Glaucoma   . OSA (obstructive sleep apnea)   . Allergic rhinitis   . Parkinsonism 01/16/2013  . Hyperlipidemia   . Thyroid disease     HYPOTHYROID  . Hypogonadism male   . Pre-diabetes     Past Surgical History  Procedure Laterality Date  . Uvulopalatopharyngoplasty    . Lumbar disc surgery    . Cataract extraction      There were no vitals taken for this visit.  Visit Diagnosis:  Bradykinesia  Cognitive deficits  Lack of coordination  Rigidity      Subjective Assessment - 09/13/14 1326    Symptoms "I'm just having a bad day"   Currently in Pain? No/denies            OT Treatments/Exercises (OP) - 09/13/14 0001    ADLs   Overall ADLs Emphasized need to slow down and stop cycle of movement if pt begins to get smaller movements, demo difficulty, freezes.   UB Dressing assisted pt in donning brace due to difficulty   Functional Mobility Reviewed strategy for opening door to decrease freezing (pt reports that strategy helps when he uses).          OT Education - 09/13/14 1332    Education provided Yes   Education Details Green putty exercises for finger extension, updated coordination HEP   Person(s) Educated Patient   Methods Explanation;Verbal cues   Comprehension Verbalized understanding;Returned  demonstration;Verbal cues required              Plan - 09/13/14 1701    Clinical Impression Statement Pt continues to demo significant timing difficulties with movement and impulsivity with frustration.  Emphasizing timing and cueing assists in movement quality.   Plan practice donning shoes/socks at pt request, big movements and reaching                               Problem List Patient Active Problem List   Diagnosis Date Noted  . DOE (dyspnea on exertion) 09/06/2014  . Fatigue due to excessive exertion 09/06/2014  . Testosterone deficiency 11/12/2013  . Routine general medical examination at a health care facility 08/30/2013  . Mixed hyperlipidemia 08/30/2013  . Other abnormal glucose 08/30/2013  . 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 Parkinsonism 01/16/2013  . INSOMNIA 06/06/2008  . GLAUCOMA 06/01/2008  . Essential hypertension 06/01/2008  . Seasonal and perennial allergic rhinitis 06/01/2008    [facility] 09/13/2014, 5:05 PM  Vianne Bulls, OTR/L 09/13/2014 5:05 PM Phone: [phone] Fax: [phone]

## 2014-09-16 ENCOUNTER — Ambulatory Visit: Payer: Medicare Other | Admitting: Occupational Therapy

## 2014-09-16 ENCOUNTER — Ambulatory Visit (INDEPENDENT_AMBULATORY_CARE_PROVIDER_SITE_OTHER): Payer: Medicare Other | Admitting: *Deleted

## 2014-09-16 DIAGNOSIS — E291 Testicular hypofunction: Secondary | ICD-10-CM

## 2014-09-16 DIAGNOSIS — N528 Other male erectile dysfunction: Secondary | ICD-10-CM

## 2014-09-16 DIAGNOSIS — N529 Male erectile dysfunction, unspecified: Secondary | ICD-10-CM

## 2014-09-16 DIAGNOSIS — E349 Endocrine disorder, unspecified: Secondary | ICD-10-CM

## 2014-09-16 MED ORDER — TESTOSTERONE CYPIONATE 200 MG/ML IM SOLN
400.0000 mg | INTRAMUSCULAR | Status: DC
  Administered 2014-09-16: 400 mg via INTRAMUSCULAR

## 2014-09-20 ENCOUNTER — Ambulatory Visit: Payer: Medicare Other | Admitting: Occupational Therapy

## 2014-09-20 DIAGNOSIS — R4189 Other symptoms and signs involving cognitive functions and awareness: Secondary | ICD-10-CM | POA: Diagnosis not present

## 2014-09-20 DIAGNOSIS — R279 Unspecified lack of coordination: Secondary | ICD-10-CM

## 2014-09-20 DIAGNOSIS — R258 Other abnormal involuntary movements: Secondary | ICD-10-CM

## 2014-09-20 DIAGNOSIS — R29898 Other symptoms and signs involving the musculoskeletal system: Secondary | ICD-10-CM

## 2014-09-20 DIAGNOSIS — G20 Parkinson's disease: Secondary | ICD-10-CM | POA: Diagnosis not present

## 2014-09-20 NOTE — Therapy (Signed)
[facility] [address] [place], [place], [address] Phone: [phone]   Fax:  [phone]  Occupational Therapy Treatment  Patient Details  Name: [name] MRN: [id number] Date of Birth: 1946/02/28  Encounter Date: 09/20/2014      OT End of Session - 09/20/14 1332    Visit Number 5  5/10 G   Number of Visits 9   Date for OT Re-Evaluation 10/04/14  scheduled through 09/29/14   Authorization Type 1Medicare, 2BCBS   OT Start Time 1320   OT Stop Time 1402   OT Time Calculation (min) 42 min   Activity Tolerance Patient tolerated treatment well      Past Medical History  Diagnosis Date  . HTN (hypertension)   . Glaucoma   . OSA (obstructive sleep apnea)   . Allergic rhinitis   . Parkinsonism 01/16/2013  . Hyperlipidemia   . Thyroid disease     HYPOTHYROID  . Hypogonadism male   . Pre-diabetes     Past Surgical History  Procedure Laterality Date  . Uvulopalatopharyngoplasty    . Lumbar disc surgery    . Cataract extraction      There were no vitals taken for this visit.  Visit Diagnosis:  Bradykinesia  Cognitive deficits  Lack of coordination  Rigidity      Subjective Assessment - 09/20/14 1323    Symptoms "It's hard to get my socks on in the morning because i'm stiff."  Pt reports 3 falls (falls to the right and forward, more when he is not focused on what he is doing).   Currently in Pain? No/denies            OT Treatments/Exercises (OP) - 09/20/14 0001    ADLs   Overall ADLs Continue to emphasize timing with all movement and Stopping if he begins to freeze, festinate, or movement gets smaller.   Grooming Pt instructed in use of big movements (review) and use of external cues/sticky notes to assist with shaving and brushing teeth as well as counting to help with timing.  Pt verbalized understanding.   LB Dressing donning/doffing shoes, sock, and brace.  Instructed pt to use footstool (and pt  demo increased ease).  Advised pt against standing and donning brace on steps (on one leg) due to decreased balance.   Functional Mobility Practiced holding items in both hands, setting them on table, opening door, and then picking up items with feet staggared/apart to decrease freezing.  Practiced with doors that stay open and doors that don't.  Pt verbalized understanding of strategy and needed min cues to utilize.  Functional step and reach forward and back/to the side in prep for turns and set-up for big reach.  Pt with LOBx2 with RLE stepping.  Pt needed min A to recover x1.  Emphasized importance of utilizing strategy for turns (step out, no crossing) and practiced with walking.  Recommeded incorporating staggared feet into static activities as well and pt instructed in use of visual cues in problem areas in the home to assist with freezing and LOB.  Pt verbalized understanding.            OT Education - 09/20/14 1416    Education provided Yes   Education Details Adaptive strategies to decrease freezing, increase safety and ease with ADLs/IADLs   Person(s) Educated Patient   Methods Explanation;Demonstration;Verbal cues   Comprehension Verbalized understanding;Returned demonstration;Verbal cues required;Need further instruction  Plan - 09/20/14 1419    Clinical Impression Statement Pt demo improvement with functional mobility using strategies with cues.  Pt asked if wife could come to OT and therapist encouraged.   Plan repetition with big movements, timing for ADLs, review/reinforce strategies for ADLs (check goals next week)   Consulted and Agree with Plan of Care Patient                               Problem List Patient Active Problem List   Diagnosis Date Noted  . DOE (dyspnea on exertion) 09/06/2014  . Fatigue due to excessive exertion 09/06/2014  . Testosterone deficiency 11/12/2013  . Routine general medical examination at a health  care facility 08/30/2013  . Mixed hyperlipidemia 08/30/2013  . Other abnormal glucose 08/30/2013  . 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 Parkinsonism 01/16/2013  . INSOMNIA 06/06/2008  . GLAUCOMA 06/01/2008  . Essential hypertension 06/01/2008  . Seasonal and perennial allergic rhinitis 06/01/2008    [facility] 09/20/2014, 2:24 PM  Vianne Bulls, OTR/L 09/20/2014 2:24 PM Phone: [phone] Fax: [phone]

## 2014-09-23 ENCOUNTER — Ambulatory Visit: Payer: Medicare Other | Admitting: Occupational Therapy

## 2014-09-23 ENCOUNTER — Encounter: Payer: Self-pay | Admitting: Occupational Therapy

## 2014-09-23 DIAGNOSIS — R29898 Other symptoms and signs involving the musculoskeletal system: Secondary | ICD-10-CM

## 2014-09-23 DIAGNOSIS — R279 Unspecified lack of coordination: Secondary | ICD-10-CM

## 2014-09-23 DIAGNOSIS — R258 Other abnormal involuntary movements: Secondary | ICD-10-CM

## 2014-09-23 DIAGNOSIS — R4189 Other symptoms and signs involving cognitive functions and awareness: Secondary | ICD-10-CM | POA: Diagnosis not present

## 2014-09-23 DIAGNOSIS — G20 Parkinson's disease: Secondary | ICD-10-CM | POA: Diagnosis not present

## 2014-09-23 NOTE — Therapy (Signed)
[facility] [address] [place], [place], [address] Phone: [phone]   Fax:  [phone]  Occupational Therapy Treatment  Patient Details  Name: [name] MRN: [id number] Date of Birth: 01-20-1946  Encounter Date: 09/23/2014      OT End of Session - 09/23/14 1421    Visit Number 6  6/10 G   Number of Visits 9   Date for OT Re-Evaluation 10/04/14   Authorization Type 1Medicare, 2BCBS   OT Start Time 1319   OT Stop Time 1403   OT Time Calculation (min) 44 min   Activity Tolerance Patient tolerated treatment well      Past Medical History  Diagnosis Date  . HTN (hypertension)   . Glaucoma   . OSA (obstructive sleep apnea)   . Allergic rhinitis   . Parkinsonism 01/16/2013  . Hyperlipidemia   . Thyroid disease     HYPOTHYROID  . Hypogonadism male   . Pre-diabetes     Past Surgical History  Procedure Laterality Date  . Uvulopalatopharyngoplasty    . Lumbar disc surgery    . Cataract extraction      There were no vitals taken for this visit.  Visit Diagnosis:  Bradykinesia  Cognitive deficits  Lack of coordination  Rigidity      Subjective Assessment - 09/23/14 1321    Symptoms had 2 falls getting up from recliner and getting out of truck and turning--scrapes and bruises   Currently in Pain? No/denies            OT Treatments/Exercises (OP) - 09/23/14 0001    ADLs   Overall ADLs Reviewed use of visual cues to assist in big movements for ADLs for (brushing teeth, shaving, washing hands, etc.).  Also, instructed pt in using visualization for transfers.  Also recommended that pt video himself doing activities/exercises as feedback to see if/when he speeds up.  Pt verbalized understanding.     Bathing Practiced washing hands at sink and with hand sanitizer with min cues for big movements/to slow down.  Dried hands with washcloth with increaed ease so recommended pt use washcloth at home.   Functional  Mobility Practiced sit to stand with turns due to recent falls using/with min cues for turning strategies to decrease freezing and increase safety.  Pt with mild freezing x1.   Neurological Re-education Exercises   Other Exercises 1 Functional reaching overhead with LUE to place small pegs in overhead pegboard to copy design for cognitive component and to assist with decreasing speed.  1 v.c. for copying.  Good speed.          OT Education - 09/23/14 1416    Education provided Yes   Education Details Reviewed activities from HEP (putty finger extension, flipping cards, dealing cards with thumb, PWR! hands)--emphasizing slowing down and if difficulty stop and restart if he speeds up   Person(s) Educated Patient   Methods Explanation;Demonstration;Verbal cues   Comprehension Verbalized understanding              Plan - 09/23/14 1422    Clinical Impression Statement Pt demo improvement in clinic with functional mobility usin strategies with cues this week and improved timing with HEP reviewed.   Plan check goals next session with d/c in next 1-2 visits.   Consulted and Agree with Plan of Care Patient  Problem List Patient Active Problem List   Diagnosis Date Noted  . DOE (dyspnea on exertion) 09/06/2014  . Fatigue due to excessive exertion 09/06/2014  . Testosterone deficiency 11/12/2013  . Routine general medical examination at a health care facility 08/30/2013  . Mixed hyperlipidemia 08/30/2013  . Other abnormal glucose 08/30/2013  . 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 Parkinsonism 01/16/2013  . INSOMNIA 06/06/2008  . GLAUCOMA 06/01/2008  . Essential hypertension 06/01/2008  . Seasonal and perennial allergic rhinitis 06/01/2008    [facility] 09/23/2014, 2:30 PM  Vianne Bulls, OTR/L 09/23/2014 2:31 PM Phone: [phone] Fax: [phone]

## 2014-09-27 ENCOUNTER — Encounter: Payer: Self-pay | Admitting: Occupational Therapy

## 2014-09-27 ENCOUNTER — Ambulatory Visit: Payer: Medicare Other | Admitting: Occupational Therapy

## 2014-09-27 DIAGNOSIS — R279 Unspecified lack of coordination: Secondary | ICD-10-CM | POA: Diagnosis not present

## 2014-09-27 DIAGNOSIS — R258 Other abnormal involuntary movements: Secondary | ICD-10-CM

## 2014-09-27 DIAGNOSIS — R4189 Other symptoms and signs involving cognitive functions and awareness: Secondary | ICD-10-CM

## 2014-09-27 DIAGNOSIS — G20 Parkinson's disease: Secondary | ICD-10-CM | POA: Diagnosis not present

## 2014-09-27 DIAGNOSIS — R29898 Other symptoms and signs involving the musculoskeletal system: Secondary | ICD-10-CM

## 2014-09-27 NOTE — Therapy (Signed)
[facility] [address] [place], [place], [address] Phone: [phone]   Fax:  [phone]  Occupational Therapy Treatment  Patient Details  Name: [name] MRN: [id number] Date of Birth: 1946/01/20  Encounter Date: 09/27/2014      OT End of Session - 09/27/14 1458    Visit Number 7   Number of Visits 9   Date for OT Re-Evaluation 10/04/14   Authorization Type 1Medicare, 2BCBS   OT Start Time 1406   OT Stop Time 1448   OT Time Calculation (min) 42 min   Activity Tolerance Patient tolerated treatment well      Past Medical History  Diagnosis Date  . HTN (hypertension)   . Glaucoma   . OSA (obstructive sleep apnea)   . Allergic rhinitis   . Parkinsonism 01/16/2013  . Hyperlipidemia   . Thyroid disease     HYPOTHYROID  . Hypogonadism male   . Pre-diabetes     Past Surgical History  Procedure Laterality Date  . Uvulopalatopharyngoplasty    . Lumbar disc surgery    . Cataract extraction      There were no vitals taken for this visit.  Visit Diagnosis:  Bradykinesia  Cognitive deficits  Lack of coordination  Rigidity      Subjective Assessment - 09/27/14 1411    Symptoms fall carrying recycling on deck   Currently in Pain? No/denies                 OT Treatments/Exercises (OP) - 09/27/14 0001    ADLs   Overall ADLs Reviewed strategies for timing including:  visual cues, use of typing/tape recorder for memory compensation, use of videoing HEP/movement performance, turning strategy to decrease freezing.  Also reviewed ADL compensation strategies.   Bathing Practiced washing hands with hand gel with min-mod cues for big movements.   Functional Mobility Ambulating while carrying tray with items.  Dropped item x1 and able to retrieve with no LOB.  Only min reminder for strategy for turning.   ADL Comments Checked goals and discussed progress and recommendations to continue (HEP,  aerobic activity, functional compensation strategies).                OT Education - 09/27/14 1452    Education provided Yes   Education Details Reviewed remaining activities from HEP --emphasizing slowing down and stopping if difficulty or he speeds up   Person(s) Educated Patient   Methods Explanation;Demonstration;Verbal cues   Comprehension Verbalized understanding;Returned demonstration;Verbal cues required             OT Long Term Goals - 09/27/14 1412    OT LONG TERM GOAL #1   Title Pt will be independent with updated HEP prn.   Status Achieved   OT LONG TERM GOAL #2   Title Pt will verbalize understanding of adative strategies for ADLs prn.   Status Achieved   OT LONG TERM GOAL #3   Status Partially Met  inconsistent   OT LONG TERM GOAL #4   Title Pt will improve standing functional reach with each UE by at least 2 inches for improved balance for ADLs.   Status --  R-9 inches, L-9 inches with LOB and min A, then 10 inches on 2nd attempt   OT LONG TERM GOAL #5   Title Pt will improve UE functional reaching/coordination for ADLs as shown by improving score on box an blocks test by at least 4 bilaterally.   Status Not Met  R-52 blocks, L-48 blocks    (LUE improved by 3)           Plan - 09/27/14 1458    Clinical Impression Statement Pt demo improved timing, but continues to be biggest limiting factor of functional movement.   Plan d/c OT.    Consulted and Agree with Plan of Care Patient     OCCUPATIONAL THERAPY DISCHARGE SUMMARY  Remaining deficits: Timing deficits with movement, decreased coordination, bradykinesia/hypokinesia, rigidity, decreased balance for ADLs, cognitive deficits   Education / Equipment: Pt instructed in HEP, adaptive/compensation strategies for ADLs. Pt verbalized understanding of all education provided.  Plan: Patient agrees to discharge.  Patient goals were partially met. Patient is being discharged due to                                                      ?????   Reaching maximal rehab potential at this time.  Pt would benefit from re-evaluation/occupational therapy screen in approx 3-6 months to assess for need for further therapy/functional changes due to progressive nature of diagnosis.      Problem List Patient Active Problem List   Diagnosis Date Noted  . DOE (dyspnea on exertion) 09/06/2014  . Fatigue due to excessive exertion 09/06/2014  . Testosterone deficiency 11/12/2013  . Routine general medical examination at a health care facility 08/30/2013  . Mixed hyperlipidemia 08/30/2013  . Other abnormal glucose 08/30/2013  . 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 Parkinsonism 01/16/2013  . INSOMNIA 06/06/2008  . GLAUCOMA 06/01/2008  . Essential hypertension 06/01/2008  . Seasonal and perennial allergic rhinitis 06/01/2008    [facility], OTR/L 09/27/2014, 3:00 PM  [facility] [address] [place], [place], [address] Phone: [phone]   Fax:  [phone]

## 2014-09-28 ENCOUNTER — Encounter: Payer: Self-pay | Admitting: Physician Assistant

## 2014-09-28 ENCOUNTER — Ambulatory Visit (INDEPENDENT_AMBULATORY_CARE_PROVIDER_SITE_OTHER): Payer: Medicare Other | Admitting: Internal Medicine

## 2014-09-28 ENCOUNTER — Ambulatory Visit (INDEPENDENT_AMBULATORY_CARE_PROVIDER_SITE_OTHER): Payer: Medicare Other | Admitting: Physician Assistant

## 2014-09-28 ENCOUNTER — Encounter: Payer: Self-pay | Admitting: Internal Medicine

## 2014-09-28 VITALS — BP 126/80 | HR 61 | Temp 97.5°F | Resp 20 | Ht 69.25 in | Wt 206.0 lb

## 2014-09-28 VITALS — BP 122/81 | HR 67 | Ht 69.25 in | Wt 209.0 lb

## 2014-09-28 DIAGNOSIS — G20 Parkinson's disease: Secondary | ICD-10-CM

## 2014-09-28 DIAGNOSIS — R0609 Other forms of dyspnea: Secondary | ICD-10-CM

## 2014-09-28 DIAGNOSIS — I422 Other hypertrophic cardiomyopathy: Secondary | ICD-10-CM

## 2014-09-28 DIAGNOSIS — Z0000 Encounter for general adult medical examination without abnormal findings: Secondary | ICD-10-CM

## 2014-09-28 DIAGNOSIS — E782 Mixed hyperlipidemia: Secondary | ICD-10-CM | POA: Diagnosis not present

## 2014-09-28 DIAGNOSIS — E291 Testicular hypofunction: Secondary | ICD-10-CM | POA: Diagnosis not present

## 2014-09-28 DIAGNOSIS — I10 Essential (primary) hypertension: Secondary | ICD-10-CM

## 2014-09-28 DIAGNOSIS — E349 Endocrine disorder, unspecified: Secondary | ICD-10-CM

## 2014-09-28 LAB — COMPREHENSIVE METABOLIC PANEL
ALT: 30 U/L (ref 0–53)
AST: 33 U/L (ref 0–37)
Albumin: 4.4 g/dL (ref 3.5–5.2)
Alkaline Phosphatase: 55 U/L (ref 39–117)
BILIRUBIN TOTAL: 1 mg/dL (ref 0.2–1.2)
BUN: 11 mg/dL (ref 6–23)
CALCIUM: 9.1 mg/dL (ref 8.4–10.5)
CO2: 28 meq/L (ref 19–32)
CREATININE: 0.9 mg/dL (ref 0.4–1.5)
Chloride: 101 mEq/L (ref 96–112)
GFR: 88.17 mL/min (ref >60.00)
GLUCOSE: 94 mg/dL (ref 70–99)
Potassium: 4.6 mEq/L (ref 3.5–5.1)
Sodium: 136 mEq/L (ref 135–145)
Total Protein: 7 g/dL (ref 6.0–8.3)

## 2014-09-28 LAB — LIPID PANEL
Cholesterol: 186 mg/dL (ref 0–200)
HDL: 33.3 mg/dL — AB (ref >39.00)
LDL Cholesterol: 124 mg/dL — ABNORMAL HIGH (ref 0–99)
NONHDL: 152.7
TRIGLYCERIDES: 143 mg/dL (ref 0.0–149.0)
Total CHOL/HDL Ratio: 6
VLDL: 28.6 mg/dL (ref 0.0–40.0)

## 2014-09-28 NOTE — Progress Notes (Signed)
Cardiology Office Note   Date:  09/28/2014   ID:  [name], DOB Apr 22, 1946, MRN [id number]  PCP:  Nyoka Cowden, MD  Cardiologist:  Dr. [name]      History of Present Illness: [name] is a 68 y.o. male with a hx of HTN, HL, OSA, Parkinson's, OSA, glucose intolerance, hypothyroidism.  He was recently evaluated by Dr. [name] for fatigue and dyspnea.  He noted dyspnea with bending over to tie shoes and fatigue with exertion but no chest pain.  Echo and stress test was arranged.  Stress test demonstrated no ischemia. Echocardiogram did demonstrate asymmetric septal hypertrophy consistent with hypertrophic cardiomyopathy. There was no LVOT obstruction and no systolic anterior motion of the mitral valve.  He was placed on low-dose beta blocker brought back today for follow-up.  He has been tolerating Bystolic well.  He denies any significant change in his breathing. He denies chest pain. He denies orthopnea, PND or edema.   Studies:   - Echo (09/09/14):  Asymmetric septal hypertrophy c/w HCM (thickness 1.7 cm), no LVOT obstruction, EF 60-65%, no RWMA, Gr 1 DD, trivial MR, no SAM, mild RAE  - Nuclear (09/08/14):  No ischemia, EF 57%, normal study   Recent Labs: 05/13/2014: Hemoglobin 17.3*; TSH 1.66 09/28/2014: ALT 30; BUN 11; Creatinine 0.9; LDL (calc) 124*; Potassium 4.6; Sodium 136    Recent Radiology: No results found.    Wt Readings from Last 3 Encounters:  09/28/14 209 lb (94.802 kg)  09/28/14 206 lb (93.441 kg)  09/07/14 205 lb (92.987 kg)     Past Medical History  Diagnosis Date  . HTN (hypertension)   . Glaucoma   . OSA (obstructive sleep apnea)   . Allergic rhinitis   . Parkinsonism 01/16/2013  . Hyperlipidemia   . Thyroid disease     HYPOTHYROID  . Hypogonadism male   . Pre-diabetes     Current Outpatient Prescriptions  Medication Sig Dispense Refill  . amitriptyline (ELAVIL) 25 MG tablet TAKE 1 TABLET BY MOUTH AT BEDTIME 90 tablet 1    . aspirin 81 MG tablet Take 81 mg by mouth daily.      . Cholecalciferol (VITAMIN D3) 10000 UNITS capsule Take 5,000 Units by mouth. 3 times a week    . fexofenadine (ALLEGRA) 180 MG tablet Take 60 mg by mouth daily.     . fish oil-omega-3 fatty acids 1000 MG capsule Take 1,200 mg by mouth daily. 2400 mg on Tuesday, Thursday, and saturday    . fluticasone (FLONASE) 50 MCG/ACT nasal spray USE 1-2 SPRAYS IN EACH NOSTRIL EVERY DAY 16 g 6  . GUAIFENESIN PO Take 400 mg by mouth 3 (three) times daily as needed.     Marland Kitchen levothyroxine (SYNTHROID, LEVOTHROID) 50 MCG tablet TAKE 1 TABLET EVERY DAY 90 tablet 1  . losartan (COZAAR) 100 MG tablet TAKE 1 TABLET BY MOUTH DAILY 30 tablet 5  . magnesium gluconate (MAGONATE) 500 MG tablet Take 1,000 mg by mouth daily.     . Multiple Vitamins-Minerals (MULTIVITAMIN WITH MINERALS) tablet Take 1 tablet by mouth daily.      . Naproxen Sodium (ALEVE PO) Take by mouth as needed.    . nebivolol (BYSTOLIC) 2.5 MG tablet Take 1 tablet (2.5 mg total) by mouth daily. 30 tablet 6  . tadalafil (CIALIS) 5 MG tablet Take 5 mg by mouth daily.    Marland Kitchen testosterone cypionate (DEPO-TESTOSTERONE) 200 MG/ML injection Inject 2 mLs (400 mg total) into the muscle every 21 (  twenty-one) days. 10 mL 5  . timolol (TIMOPTIC) 0.5 % ophthalmic solution Place 1 drop into both eyes.   12   No current facility-administered medications for this visit.     Allergies:   Ambien; Atenolol; Lunesta; Oxazepam; Requip; and Ziac   Social History:  The patient  reports that he has never smoked. He has never used smokeless tobacco. He reports that he drinks alcohol. He reports that he does not use illicit drugs.   Family History:  The patient's family history includes COPD in his mother and sister; Cancer in his father; Diabetes in his father; Emphysema in his mother; Heart attack in his sister; Heart disease in his mother and sister; Hypertension in his father, mother, and sister; Liver cancer in his  father. There is no history of Stroke.    ROS:  Please see the history of present illness.      All other systems reviewed and negative.    PHYSICAL EXAM: VS:  BP 122/81 mmHg  Pulse 67  Ht 5' 9.25" (1.759 m)  Wt 209 lb (94.802 kg)  BMI 30.64 kg/m2  SpO2 98% Well nourished, well developed, in no acute distress HEENT: normal Neck:  no JVD Cardiac:  normal S1, S2;  RRR; no murmur   Lungs:   clear to auscultation bilaterally, no wheezing, rhonchi or rales Abd: soft, nontender, no hepatomegaly Ext:  no edema Skin: warm and dry Neuro:  CNs 2-12 intact, no focal abnormalities noted  EKG:  NSR, HR 63, normal axis, inf Q waves, no change from prior tracing.       ASSESSMENT AND PLAN:  1.  Hypertrophic cardiomyopathy: No LVOT obstruction by echocardiogram. Continue with low dose beta blocker. At this point, he does not require ETT or Holter monitor.  There is no real room in his HR and BP to increase his medications further.   2.  Dyspnea: Etiology not entirely clear. Some of this may be related to his hypertrophic cardiopathy. Recent Myoview was normal. He denies a history of smoking. Question of some of his symptoms may be related to Parkinson's. No further testing is indicated this time. 3.  Hypertension: Controlled.   Disposition:   FU with Dr. [name] 3 mos.   Signed, Versie Starks, MHS 09/28/2014 4:08 PM    [facility] [place], [place], [place]  [address] Phone: [phone]; Fax: [phone]

## 2014-09-28 NOTE — Patient Instructions (Signed)
Your physician recommends that you schedule a follow-up appointment in: 3 MONTHS WITH DR. TURNER   Your physician recommends that you continue on your current medications as directed. Please refer to the Current Medication list given to you today.   

## 2014-09-28 NOTE — Progress Notes (Signed)
Subjective:    Patient ID: [name], male    DOB: 1946/08/06, 68 y.o.   MRN: [id number]  HPI    Subjective:    Patient ID: [name], male    DOB: [birth year], 68 y.o.   MRN: [id number]  HPI  56 -year-old patient who is seen today for a annual examination Chronic medical problems include hypertension glaucoma allergic rhinitis and history of impaired glucose tolerance. He is on testosterone replacement for testosterone deficiency and has a history of vitamin D. deficiency. He is followed by neurology due to PD but has not been very tolerant of medications. He is followed by multiple consultants including cardiology.  He has had a recent nuclear stress test and 2-D echocardiogram.  He is followed by ophthalmology and is up-to-date on colonoscopies  Family history father died at age 72 with liver cancer also had hypothyroidism Mother died 35 complications of COPD One brother history of schizophrenia 2 sisters' COPD lupus and hypothyroidism  Social history. Retired Film/video editor. Married for 46 years. 2 daughters and 3 grandchildren. No tobacco use  Surgical history prior tonsillectomy and uvuloplasty. 1992 lumbar disc disease surgery  Past Medical History  Diagnosis Date  . HTN (hypertension)   . Glaucoma   . OSA (obstructive sleep apnea)   . Allergic rhinitis   . Parkinsonism 01/16/2013  . Hyperlipidemia   . Thyroid disease     HYPOTHYROID  . Hypogonadism male   . Pre-diabetes     History   Social History  . Marital Status: Married    Spouse Name: linda    Number of Children: 2  . Years of Education: college   Occupational History  . branch Librarian, academic   .     Social History Main Topics  . Smoking status: Never Smoker   . Smokeless tobacco: Never Used  . Alcohol Use: Yes  . Drug Use: No  . Sexual Activity: Not on file   Other Topics Concern  . Not on file   Social History Narrative    Past Surgical History  Procedure Laterality  Date  . Uvulopalatopharyngoplasty    . Lumbar disc surgery    . Cataract extraction      Family History  Problem Relation Age of Onset  . Emphysema Mother   . COPD Mother   . Heart disease Mother   . Liver cancer Father   . Cancer Father   . Diabetes Father   . Heart disease Sister   . COPD Sister     Allergies  Allergen Reactions  . Ambien [Zolpidem Tartrate]   . Atenolol   . Lunesta [Eszopiclone]   . Oxazepam   . Requip [Ropinirole Hcl]   . Ziac [Bisoprolol-Hydrochlorothiazide]     Current Outpatient Prescriptions on File Prior to Visit  Medication Sig Dispense Refill  . amitriptyline (ELAVIL) 25 MG tablet TAKE 1 TABLET BY MOUTH AT BEDTIME 90 tablet 1  . aspirin 81 MG tablet Take 81 mg by mouth daily.      . Cholecalciferol (VITAMIN D3) 10000 UNITS capsule Take 5,000 Units by mouth. 3 times a week    . fexofenadine (ALLEGRA) 180 MG tablet Take 60 mg by mouth daily.     . fish oil-omega-3 fatty acids 1000 MG capsule Take 1,200 mg by mouth daily. 2400 mg on Tuesday, Thursday, and saturday    . fluticasone (FLONASE) 50 MCG/ACT nasal spray USE 1-2 SPRAYS IN EACH NOSTRIL EVERY DAY 16 g 6  . GUAIFENESIN PO  Take 400 mg by mouth 3 (three) times daily as needed.     Marland Kitchen levothyroxine (SYNTHROID, LEVOTHROID) 50 MCG tablet TAKE 1 TABLET EVERY DAY 90 tablet 1  . losartan (COZAAR) 100 MG tablet TAKE 1 TABLET BY MOUTH DAILY 30 tablet 5  . magnesium gluconate (MAGONATE) 500 MG tablet Take 1,000 mg by mouth daily.     . Multiple Vitamins-Minerals (MULTIVITAMIN WITH MINERALS) tablet Take 1 tablet by mouth daily.      . Naproxen Sodium (ALEVE PO) Take by mouth as needed.    . nebivolol (BYSTOLIC) 2.5 MG tablet Take 1 tablet (2.5 mg total) by mouth daily. 30 tablet 6  . tadalafil (CIALIS) 5 MG tablet Take 5 mg by mouth daily.    Marland Kitchen testosterone cypionate (DEPO-TESTOSTERONE) 200 MG/ML injection Inject 2 mLs (400 mg total) into the muscle every 21 ( twenty-one) days. 10 mL 5  . timolol  (TIMOPTIC) 0.5 % ophthalmic solution Place 1 drop into both eyes.   12   No current facility-administered medications on file prior to visit.    BP 126/80 mmHg  Pulse 61  Temp(Src) 97.5 F (36.4 C) (Oral)  Resp 20  Ht 5' 9.25" (1.759 m)  Wt 206 lb (93.441 kg)  BMI 30.20 kg/m2  SpO2 95%  1. Risk factors, based on past  M,S,F history.  Cardiovascular risk factors include hypertension and dyslipidemia.  Recent normal nuclear stress test  2.  Physical activities: Limited due to parkinsonism and fatigue  3.  Depression/mood: No history of major depression  4.  Hearing: No significant deficits  5.  ADL's: Independent in all aspects of daily living 6.  Fall risk:  7.  Home safety: Moderately high due to parkinsonism and gait instability  8.  Height weight, and visual acuity; height and weight stable no change in visual acuity   9.  Counseling: Heart healthy diet recommended  10. Lab orders based on risk factors: Will check lipid profile and electrolytes  11. Referral : Follow-up cardiology, urology, ophthalmology  12. Care plan: Continue heart healthy diet and present medical regimen.  Patient is up-to-date on colonoscopies  13. Cognitive assessment: Alert and oriented with normal affect.  No cognitive dysfunction  14. Screening:  Patient was provided with a written and personalized care plan  15. Provider List Update: , urology, ophthalmology, neurology, cardiology     Review of Systems  Constitutional: Negative for fever, chills, activity change, appetite change and fatigue.  HENT: Negative for congestion, dental problem, ear pain, hearing loss, mouth sores, rhinorrhea, sinus pressure, sneezing, tinnitus, trouble swallowing and voice change.   Eyes: Negative for photophobia, pain, redness and visual disturbance.  Respiratory: Negative for apnea, cough, choking, chest tightness, shortness of breath and wheezing.   Cardiovascular: Negative for chest pain, palpitations and  leg swelling.  Gastrointestinal: Negative for nausea, vomiting, abdominal pain, diarrhea, constipation, blood in stool, abdominal distention, anal bleeding and rectal pain.  Genitourinary: Positive for urgency. Negative for dysuria, frequency, hematuria, flank pain, decreased urine volume, discharge, penile swelling, scrotal swelling, difficulty urinating, genital sores and testicular pain.  Musculoskeletal: Positive for gait problem. Negative for arthralgias, back pain, joint swelling, myalgias, neck pain and neck stiffness.  Skin: Negative for color change, rash and wound.  Neurological: Negative for dizziness, tremors, seizures, syncope, facial asymmetry, speech difficulty, weakness, light-headedness, numbness and headaches.  Hematological: Negative for adenopathy. Does not bruise/bleed easily.  Psychiatric/Behavioral: Negative for suicidal ideas, hallucinations, behavioral problems, confusion, sleep disturbance, self-injury, dysphoric mood, decreased concentration  and agitation. The patient is not nervous/anxious.        Objective:   Physical Exam  Constitutional: He appears well-developed and well-nourished.  HENT:  Head: Normocephalic and atraumatic.  Right Ear: External ear normal.  Left Ear: External ear normal.  Nose: Nose normal.  Mouth/Throat: Oropharynx is clear and moist.  Pharyngeal crowding Status post palatoplasty  Eyes: Conjunctivae and EOM are normal. Pupils are equal, round, and reactive to light. No scleral icterus.  Neck: Normal range of motion. Neck supple. No JVD present. No thyromegaly present.  Cardiovascular: Regular rhythm, normal heart sounds and intact distal pulses.  Exam reveals no gallop and no friction rub.   No murmur heard. Dorsalis pedis pulses full. Posterior tibial pulses not easily palpable  Pulmonary/Chest: Effort normal and breath sounds normal. He exhibits no tenderness.  Abdominal: Soft. Bowel sounds are normal. He exhibits no distension and no  mass. There is no tenderness.  Genitourinary: Penis normal.  Musculoskeletal: Normal range of motion. He exhibits no edema and no tenderness.  Lymphadenopathy:    He has no cervical adenopathy.  Neurological: He is alert. He has normal reflexes. No cranial nerve deficit. Coordination normal.  Skin: Skin is warm and dry. No rash noted.  Psychiatric: He has a normal mood and affect. His behavior is normal.          Assessment & Plan:   Hypertension. Will continue home blood pressure monitoring and present regimen. Modest weight loss salt restricted diet encouraged Seasonal allergic rhinitis/status post recent URI BPH.  F/u Urology Parkinson's disease. Followup neurology  Review of Systems  Constitutional: Positive for fatigue.  Respiratory: Positive for shortness of breath.   Musculoskeletal: Positive for gait problem.       Objective:   Physical Exam  As above      Assessment & Plan:   As above

## 2014-09-28 NOTE — Patient Instructions (Addendum)
Limit your sodium (Salt) intake    It is important that you exercise regularly, at least 20 minutes 3 to 4 times per week.  If you develop chest pain or shortness of breath seek  medical attention.  Return in 6 months for follow-up Health Maintenance A healthy lifestyle and preventative care can promote health and wellness.  Maintain regular health, dental, and eye exams.  Eat a healthy diet. Foods like vegetables, fruits, whole grains, low-fat dairy products, and lean protein foods contain the nutrients you need and are low in calories. Decrease your intake of foods high in solid fats, added sugars, and salt. Get information about a proper diet from your health care provider, if necessary.  Regular physical exercise is one of the most important things you can do for your health. Most adults should get at least 150 minutes of moderate-intensity exercise (any activity that increases your heart rate and causes you to sweat) each week. In addition, most adults need muscle-strengthening exercises on 2 or more days a week.   Maintain a healthy weight. The body mass index (BMI) is a screening tool to identify possible weight problems. It provides an estimate of body fat based on height and weight. Your health care provider can find your BMI and can help you achieve or maintain a healthy weight. For males 20 years and older:  A BMI below 18.5 is considered underweight.  A BMI of 18.5 to 24.9 is normal.  A BMI of 25 to 29.9 is considered overweight.  A BMI of 30 and above is considered obese.  Maintain normal blood lipids and cholesterol by exercising and minimizing your intake of saturated fat. Eat a balanced diet with plenty of fruits and vegetables. Blood tests for lipids and cholesterol should begin at age 20 and be repeated every 5 years. If your lipid or cholesterol levels are high, you are over age 50, or you are at high risk for heart disease, you may need your cholesterol levels checked  more frequently.Ongoing high lipid and cholesterol levels should be treated with medicines if diet and exercise are not working.  If you smoke, find out from your health care provider how to quit. If you do not use tobacco, do not start.  Lung cancer screening is recommended for adults aged 55-80 years who are at high risk for developing lung cancer because of a history of smoking. A yearly low-dose CT scan of the lungs is recommended for people who have at least a 30-pack-year history of smoking and are current smokers or have quit within the past 15 years. A pack year of smoking is smoking an average of 1 pack of cigarettes a day for 1 year (for example, a 30-pack-year history of smoking could mean smoking 1 pack a day for 30 years or 2 packs a day for 15 years). Yearly screening should continue until the smoker has stopped smoking for at least 15 years. Yearly screening should be stopped for people who develop a health problem that would prevent them from having lung cancer treatment.  If you choose to drink alcohol, do not have more than 2 drinks per day. One drink is considered to be 12 oz (360 mL) of beer, 5 oz (150 mL) of wine, or 1.5 oz (45 mL) of liquor.  Avoid the use of street drugs. Do not share needles with anyone. Ask for help if you need support or instructions about stopping the use of drugs.  High blood pressure causes heart disease   and increases the risk of stroke. Blood pressure should be checked at least every 1-2 years. Ongoing high blood pressure should be treated with medicines if weight loss and exercise are not effective.  If you are 45-79 years old, ask your health care provider if you should take aspirin to prevent heart disease.  Diabetes screening involves taking a blood sample to check your fasting blood sugar level. This should be done once every 3 years after age 45 if you are at a normal weight and without risk factors for diabetes. Testing should be considered at a  younger age or be carried out more frequently if you are overweight and have at least 1 risk factor for diabetes.  Colorectal cancer can be detected and often prevented. Most routine colorectal cancer screening begins at the age of 50 and continues through age 75. However, your health care provider may recommend screening at an earlier age if you have risk factors for colon cancer. On a yearly basis, your health care provider may provide home test kits to check for hidden blood in the stool. A small camera at the end of a tube may be used to directly examine the colon (sigmoidoscopy or colonoscopy) to detect the earliest forms of colorectal cancer. Talk to your health care provider about this at age 50 when routine screening begins. A direct exam of the colon should be repeated every 5-10 years through age 75, unless early forms of precancerous polyps or small growths are found.  People who are at an increased risk for hepatitis B should be screened for this virus. You are considered at high risk for hepatitis B if:  You were born in a country where hepatitis B occurs often. Talk with your health care provider about which countries are considered high risk.  Your parents were born in a high-risk country and you have not received a shot to protect against hepatitis B (hepatitis B vaccine).  You have HIV or AIDS.  You use needles to inject street drugs.  You live with, or have sex with, someone who has hepatitis B.  You are a man who has sex with other men (MSM).  You get hemodialysis treatment.  You take certain medicines for conditions like cancer, organ transplantation, and autoimmune conditions.  Hepatitis C blood testing is recommended for all people born from 1945 through 1965 and any individual with known risk factors for hepatitis C.  Healthy men should no longer receive prostate-specific antigen (PSA) blood tests as part of routine cancer screening. Talk to your health care provider  about prostate cancer screening.  Testicular cancer screening is not recommended for adolescents or adult males who have no symptoms. Screening includes self-exam, a health care provider exam, and other screening tests. Consult with your health care provider about any symptoms you have or any concerns you have about testicular cancer.  Practice safe sex. Use condoms and avoid high-risk sexual practices to reduce the spread of sexually transmitted infections (STIs).  You should be screened for STIs, including gonorrhea and chlamydia if:  You are sexually active and are younger than 24 years.  You are older than 24 years, and your health care provider tells you that you are at risk for this type of infection.  Your sexual activity has changed since you were last screened, and you are at an increased risk for chlamydia or gonorrhea. Ask your health care provider if you are at risk.  If you are at risk of being infected with   HIV, it is recommended that you take a prescription medicine daily to prevent HIV infection. This is called pre-exposure prophylaxis (PrEP). You are considered at risk if:  You are a man who has sex with other men (MSM).  You are a heterosexual man who is sexually active with multiple partners.  You take drugs by injection.  You are sexually active with a partner who has HIV.  Talk with your health care provider about whether you are at high risk of being infected with HIV. If you choose to begin PrEP, you should first be tested for HIV. You should then be tested every 3 months for as long as you are taking PrEP.  Use sunscreen. Apply sunscreen liberally and repeatedly throughout the day. You should seek shade when your shadow is shorter than you. Protect yourself by wearing long sleeves, pants, a wide-brimmed hat, and sunglasses year round whenever you are outdoors.  Tell your health care provider of new moles or changes in moles, especially if there is a change in shape  or color. Also, tell your health care provider if a mole is larger than the size of a pencil eraser.  A one-time screening for abdominal aortic aneurysm (AAA) and surgical repair of large AAAs by ultrasound is recommended for men aged 65-75 years who are current or former smokers.  Stay current with your vaccines (immunizations). Document Released: 03/22/2008 Document Revised: 09/29/2013 Document Reviewed: 02/19/2011 Exit[employer] Patient Information 2015 [employer]. This information is not intended to replace advice given to you by your health care provider. Make sure you discuss any questions you have with your health care provider.  

## 2014-09-29 ENCOUNTER — Encounter: Payer: Medicare Other | Admitting: Occupational Therapy

## 2014-10-02 ENCOUNTER — Other Ambulatory Visit: Payer: Self-pay | Admitting: Emergency Medicine

## 2014-10-08 DIAGNOSIS — B958 Unspecified staphylococcus as the cause of diseases classified elsewhere: Secondary | ICD-10-CM

## 2014-10-08 HISTORY — DX: Unspecified staphylococcus as the cause of diseases classified elsewhere: B95.8

## 2014-10-12 ENCOUNTER — Telehealth: Payer: Self-pay | Admitting: *Deleted

## 2014-10-12 ENCOUNTER — Telehealth: Payer: Self-pay | Admitting: Neurology

## 2014-10-12 NOTE — Telephone Encounter (Signed)
Patient came in today, and is requesting speech therapy with Dr Rexene Alberts but does not wish to go next door to [facility], any where else but there he explained, did not say why.

## 2014-10-12 NOTE — Telephone Encounter (Signed)
See note from 10/12/14, patient called back and Tashia gave message to him

## 2014-10-12 NOTE — Telephone Encounter (Signed)
Patient calling to return Ammie's call informed patient of Dr Guadelupe Sabin message, please see phone note dated 10/12/14 patient verbalized understanding and had no further questions or concerns.

## 2014-10-12 NOTE — Telephone Encounter (Signed)
Pls let pt know, we will address this during FU appt which is coming up next month.

## 2014-10-12 NOTE — Telephone Encounter (Signed)
Called patient and left message to call our office concerning Dr Guadelupe Sabin message below

## 2014-10-13 ENCOUNTER — Encounter: Payer: Self-pay | Admitting: Internal Medicine

## 2014-10-13 ENCOUNTER — Ambulatory Visit (INDEPENDENT_AMBULATORY_CARE_PROVIDER_SITE_OTHER): Payer: Medicare Other | Admitting: Internal Medicine

## 2014-10-13 VITALS — BP 140/80 | HR 77 | Temp 98.0°F | Resp 20 | Ht 69.25 in | Wt 209.0 lb

## 2014-10-13 DIAGNOSIS — E349 Endocrine disorder, unspecified: Secondary | ICD-10-CM

## 2014-10-13 DIAGNOSIS — T733XXS Exhaustion due to excessive exertion, sequela: Secondary | ICD-10-CM | POA: Diagnosis not present

## 2014-10-13 DIAGNOSIS — E291 Testicular hypofunction: Secondary | ICD-10-CM | POA: Diagnosis not present

## 2014-10-13 DIAGNOSIS — G20 Parkinson's disease: Secondary | ICD-10-CM | POA: Diagnosis not present

## 2014-10-13 DIAGNOSIS — I10 Essential (primary) hypertension: Secondary | ICD-10-CM | POA: Diagnosis not present

## 2014-10-13 MED ORDER — TESTOSTERONE CYPIONATE 200 MG/ML IM SOLN
400.0000 mg | Freq: Once | INTRAMUSCULAR | Status: AC
  Administered 2014-10-13: 400 mg via INTRAMUSCULAR

## 2014-10-13 NOTE — Progress Notes (Signed)
Subjective:    Patient ID: [name], male    DOB: 1946/01/30, 69 y.o.   MRN: [id number]  HPI   69 year old patient who is seen today in follow-up.  He was seen 2 weeks ago for an annual exam.  Today he presents with complaints of fatigue, which has been chronic.  He has testosterone deficiency but has not had an injection in about 4 weeks.  He has treated hypertension.  Blood pressure regimen does include beta blocker therapy.  He has a history of PD and has been followed by neurology.  He is also followed by cardiology. Laboratory studies reviewed  Past Medical History  Diagnosis Date  . HTN (hypertension)   . Glaucoma   . OSA (obstructive sleep apnea)   . Allergic rhinitis   . Parkinsonism 01/16/2013  . Hyperlipidemia   . Thyroid disease     HYPOTHYROID  . Hypogonadism male   . Pre-diabetes     History   Social History  . Marital Status: Married    Spouse Name: linda    Number of Children: 2  . Years of Education: college   Occupational History  . branch Librarian, academic   .     Social History Main Topics  . Smoking status: Never Smoker   . Smokeless tobacco: Never Used  . Alcohol Use: Yes  . Drug Use: No  . Sexual Activity: Not on file   Other Topics Concern  . Not on file   Social History Narrative    Past Surgical History  Procedure Laterality Date  . Uvulopalatopharyngoplasty    . Lumbar disc surgery    . Cataract extraction      Family History  Problem Relation Age of Onset  . Emphysema Mother   . COPD Mother   . Heart disease Mother   . Liver cancer Father   . Cancer Father   . Diabetes Father   . Heart disease Sister   . COPD Sister   . Heart attack Sister   . Hypertension Mother   . Hypertension Father   . Hypertension Sister   . Stroke Neg Hx     Allergies  Allergen Reactions  . Ambien [Zolpidem Tartrate]   . Atenolol   . Lunesta [Eszopiclone]   . Oxazepam   . Requip [Ropinirole Hcl]   . Ziac  [Bisoprolol-Hydrochlorothiazide]     Current Outpatient Prescriptions on File Prior to Visit  Medication Sig Dispense Refill  . amitriptyline (ELAVIL) 25 MG tablet TAKE 1 TABLET BY MOUTH AT BEDTIME 90 tablet 1  . aspirin 81 MG tablet Take 81 mg by mouth daily.      . Cholecalciferol (VITAMIN D3) 10000 UNITS capsule Take 5,000 Units by mouth. 3 times a week    . fexofenadine (ALLEGRA) 180 MG tablet Take 60 mg by mouth daily.     . fish oil-omega-3 fatty acids 1000 MG capsule Take 1,200 mg by mouth daily. 2400 mg on Tuesday, Thursday, and saturday    . fluticasone (FLONASE) 50 MCG/ACT nasal spray USE 1-2 SPRAYS IN EACH NOSTRIL EVERY DAY 16 g 6  . GUAIFENESIN PO Take 400 mg by mouth 3 (three) times daily as needed.     Marland Kitchen levothyroxine (SYNTHROID, LEVOTHROID) 50 MCG tablet TAKE 1 TABLET BY MOUTH EVERY DAY 30 tablet 0  . losartan (COZAAR) 100 MG tablet TAKE 1 TABLET BY MOUTH DAILY 30 tablet 5  . magnesium gluconate (MAGONATE) 500 MG tablet Take 1,000 mg by mouth daily.     Marland Kitchen  Multiple Vitamins-Minerals (MULTIVITAMIN WITH MINERALS) tablet Take 1 tablet by mouth daily.      . Naproxen Sodium (ALEVE PO) Take by mouth as needed.    . nebivolol (BYSTOLIC) 2.5 MG tablet Take 1 tablet (2.5 mg total) by mouth daily. 30 tablet 6  . tadalafil (CIALIS) 5 MG tablet Take 5 mg by mouth daily.    Marland Kitchen testosterone cypionate (DEPO-TESTOSTERONE) 200 MG/ML injection Inject 2 mLs (400 mg total) into the muscle every 21 ( twenty-one) days. 10 mL 5  . timolol (TIMOPTIC) 0.5 % ophthalmic solution Place 1 drop into both eyes.   12   No current facility-administered medications on file prior to visit.    BP 140/80 mmHg  Pulse 77  Temp(Src) 98 F (36.7 C) (Oral)  Resp 20  Ht 5' 9.25" (1.759 m)  Wt 209 lb (94.802 kg)  BMI 30.64 kg/m2     Review of Systems  Constitutional: Positive for activity change and fatigue. Negative for fever, chills and appetite change.  HENT: Negative for congestion, dental problem, ear  pain, hearing loss, sore throat, tinnitus, trouble swallowing and voice change.   Eyes: Negative for pain, discharge and visual disturbance.  Respiratory: Negative for cough, chest tightness, wheezing and stridor.   Cardiovascular: Negative for chest pain, palpitations and leg swelling.  Gastrointestinal: Negative for nausea, vomiting, abdominal pain, diarrhea, constipation, blood in stool and abdominal distention.  Genitourinary: Negative for urgency, hematuria, flank pain, discharge, difficulty urinating and genital sores.  Musculoskeletal: Positive for gait problem. Negative for myalgias, back pain, joint swelling, arthralgias and neck stiffness.  Skin: Negative for rash.  Neurological: Positive for weakness. Negative for dizziness, syncope, speech difficulty, numbness and headaches.  Hematological: Negative for adenopathy. Does not bruise/bleed easily.  Psychiatric/Behavioral: Negative for behavioral problems and dysphoric mood. The patient is not nervous/anxious.        Objective:   Physical Exam  Constitutional: He is oriented to person, place, and time. He appears well-developed.  Repeat blood pressure 118/78  HENT:  Head: Normocephalic.  Right Ear: External ear normal.  Left Ear: External ear normal.  Eyes: Conjunctivae and EOM are normal.  Neck: Normal range of motion.  Cardiovascular: Normal rate and normal heart sounds.   Pulmonary/Chest: Breath sounds normal.  Abdominal: Bowel sounds are normal.  Musculoskeletal: Normal range of motion. He exhibits no edema or tenderness.  Neurological: He is alert and oriented to person, place, and time.  Parkinsonian facies with bradykinesia  Psychiatric: He has a normal mood and affect. His behavior is normal.          Assessment & Plan:   Fatigue. Testosterone deficiency.  Patient has not had an injection in 4 weeks.  We'll dispense today.  Hopefully this will improve his fatigue Hypertension, stable PD follow-up  neurology  We'll recheck in 3 weeks and check a testosterone level at that time

## 2014-10-13 NOTE — Progress Notes (Signed)
Pre visit review using our clinic review tool, if applicable. No additional management support is needed unless otherwise documented below in the visit note. 

## 2014-10-13 NOTE — Patient Instructions (Signed)
Limit your sodium (Salt) intake

## 2014-10-14 ENCOUNTER — Ambulatory Visit: Payer: Medicare Other | Admitting: Internal Medicine

## 2014-10-18 ENCOUNTER — Inpatient Hospital Stay (HOSPITAL_COMMUNITY): Payer: Medicare Other | Admitting: Certified Registered Nurse Anesthetist

## 2014-10-18 ENCOUNTER — Inpatient Hospital Stay (HOSPITAL_COMMUNITY)
Admission: AD | Admit: 2014-10-18 | Discharge: 2014-10-22 | DRG: 502 | Disposition: A | Discharge status: Home / Self Care | Payer: Medicare Other | Source: Ambulatory Visit | Attending: Orthopedic Surgery | Admitting: Orthopedic Surgery

## 2014-10-18 ENCOUNTER — Encounter (HOSPITAL_COMMUNITY): Payer: Self-pay | Admitting: Certified Registered"

## 2014-10-18 ENCOUNTER — Encounter (HOSPITAL_COMMUNITY)
Admission: AD | Disposition: A | Discharge status: Home / Self Care | Payer: Self-pay | Source: Ambulatory Visit | Attending: Orthopedic Surgery

## 2014-10-18 ENCOUNTER — Ambulatory Visit (INDEPENDENT_AMBULATORY_CARE_PROVIDER_SITE_OTHER): Payer: Medicare Other | Admitting: Family Medicine

## 2014-10-18 ENCOUNTER — Encounter: Payer: Self-pay | Admitting: Family Medicine

## 2014-10-18 VITALS — BP 120/67 | HR 76 | Temp 97.6°F

## 2014-10-18 DIAGNOSIS — Z7951 Long term (current) use of inhaled steroids: Secondary | ICD-10-CM | POA: Diagnosis not present

## 2014-10-18 DIAGNOSIS — Z9849 Cataract extraction status, unspecified eye: Secondary | ICD-10-CM | POA: Diagnosis not present

## 2014-10-18 DIAGNOSIS — E039 Hypothyroidism, unspecified: Secondary | ICD-10-CM | POA: Diagnosis present

## 2014-10-18 DIAGNOSIS — E785 Hyperlipidemia, unspecified: Secondary | ICD-10-CM | POA: Diagnosis present

## 2014-10-18 DIAGNOSIS — Z7982 Long term (current) use of aspirin: Secondary | ICD-10-CM | POA: Diagnosis not present

## 2014-10-18 DIAGNOSIS — G20 Parkinson's disease: Secondary | ICD-10-CM | POA: Diagnosis present

## 2014-10-18 DIAGNOSIS — Z888 Allergy status to other drugs, medicaments and biological substances status: Secondary | ICD-10-CM | POA: Diagnosis not present

## 2014-10-18 DIAGNOSIS — G4733 Obstructive sleep apnea (adult) (pediatric): Secondary | ICD-10-CM | POA: Diagnosis present

## 2014-10-18 DIAGNOSIS — M009 Pyogenic arthritis, unspecified: Secondary | ICD-10-CM

## 2014-10-18 DIAGNOSIS — Z79899 Other long term (current) drug therapy: Secondary | ICD-10-CM | POA: Diagnosis not present

## 2014-10-18 DIAGNOSIS — M71161 Other infective bursitis, right knee: Secondary | ICD-10-CM | POA: Diagnosis not present

## 2014-10-18 DIAGNOSIS — M25561 Pain in right knee: Secondary | ICD-10-CM | POA: Diagnosis not present

## 2014-10-18 DIAGNOSIS — I10 Essential (primary) hypertension: Secondary | ICD-10-CM | POA: Diagnosis present

## 2014-10-18 DIAGNOSIS — M71061 Abscess of bursa, right knee: Secondary | ICD-10-CM | POA: Diagnosis present

## 2014-10-18 HISTORY — PX: I & D EXTREMITY: SHX5045

## 2014-10-18 LAB — BASIC METABOLIC PANEL
ANION GAP: 11 (ref 5–15)
BUN: 14 mg/dL (ref 6–23)
CO2: 21 mmol/L (ref 19–32)
Calcium: 8.4 mg/dL (ref 8.4–10.5)
Chloride: 104 mEq/L (ref 96–112)
Creatinine, Ser: 0.98 mg/dL (ref 0.50–1.35)
GFR, EST NON AFRICAN AMERICAN: 83 mL/min — AB (ref >90)
Glucose, Bld: 104 mg/dL — ABNORMAL HIGH (ref 70–99)
Potassium: 3.5 mmol/L (ref 3.5–5.1)
Sodium: 136 mmol/L (ref 135–145)

## 2014-10-18 LAB — CBC
HCT: 41.9 % (ref 39.0–52.0)
HEMOGLOBIN: 14.5 g/dL (ref 13.0–17.0)
MCH: 29.4 pg (ref 26.0–34.0)
MCHC: 34.6 g/dL (ref 30.0–36.0)
MCV: 85 fL (ref 78.0–100.0)
Platelets: 160 10*3/uL (ref 150–400)
RBC: 4.93 MIL/uL (ref 4.22–5.81)
RDW: 14.4 % (ref 11.5–15.5)
WBC: 13 10*3/uL — ABNORMAL HIGH (ref 4.0–10.5)

## 2014-10-18 LAB — GLUCOSE, CAPILLARY: Glucose-Capillary: 92 mg/dL (ref 70–99)

## 2014-10-18 SURGERY — IRRIGATION AND DEBRIDEMENT EXTREMITY
Anesthesia: General | Site: Knee | Laterality: Right

## 2014-10-18 MED ORDER — NEBIVOLOL HCL 2.5 MG PO TABS
2.5000 mg | ORAL_TABLET | Freq: Every day | ORAL | Status: DC
Start: 2014-10-19 — End: 2014-10-18

## 2014-10-18 MED ORDER — ONDANSETRON HCL 4 MG/2ML IJ SOLN: 4.0000 mg | Freq: Four times a day (QID) | INTRAMUSCULAR | Status: DC | PRN

## 2014-10-18 MED ORDER — METOCLOPRAMIDE HCL 5 MG/ML IJ SOLN: 5.0000 mg | Freq: Three times a day (TID) | INTRAMUSCULAR | Status: DC | PRN

## 2014-10-18 MED ORDER — SODIUM CHLORIDE 0.9 % IV SOLN
INTRAVENOUS | Status: DC
  Administered 2014-10-19: via INTRAVENOUS

## 2014-10-18 MED ORDER — FENTANYL CITRATE 0.05 MG/ML IJ SOLN
INTRAMUSCULAR | Status: AC
  Filled 2014-10-18: qty 5

## 2014-10-18 MED ORDER — PROPOFOL 10 MG/ML IV BOLUS
INTRAVENOUS | Status: DC | PRN
  Administered 2014-10-18: 180 mg via INTRAVENOUS

## 2014-10-18 MED ORDER — OXYCODONE-ACETAMINOPHEN 5-325 MG PO TABS
1.0000 | ORAL_TABLET | ORAL | Status: DC | PRN
  Administered 2014-10-19 – 2014-10-21 (×10): 2 via ORAL
  Administered 2014-10-22: 1 via ORAL
  Filled 2014-10-18 (×2): qty 2
  Filled 2014-10-18: qty 1
  Filled 2014-10-18 (×8): qty 2

## 2014-10-18 MED ORDER — HYDROMORPHONE HCL 1 MG/ML IJ SOLN
0.2500 mg | INTRAMUSCULAR | Status: DC | PRN
  Administered 2014-10-18 (×6): 0.5 mg via INTRAVENOUS

## 2014-10-18 MED ORDER — MAGNESIUM CITRATE PO SOLN: 1.0000 | Freq: Once | ORAL | Status: AC | PRN

## 2014-10-18 MED ORDER — CEFAZOLIN SODIUM-DEXTROSE 2-3 GM-% IV SOLR
INTRAVENOUS | Status: AC
  Filled 2014-10-18: qty 50

## 2014-10-18 MED ORDER — DOCUSATE SODIUM 100 MG PO CAPS
100.0000 mg | ORAL_CAPSULE | Freq: Two times a day (BID) | ORAL | Status: DC
  Administered 2014-10-19 – 2014-10-22 (×8): 100 mg via ORAL
  Filled 2014-10-18 (×8): qty 1

## 2014-10-18 MED ORDER — HYDROMORPHONE HCL 1 MG/ML IJ SOLN
INTRAMUSCULAR | Status: AC
  Administered 2014-10-18: 0.5 mg via INTRAVENOUS
  Filled 2014-10-18: qty 1

## 2014-10-18 MED ORDER — ONDANSETRON HCL 4 MG PO TABS: 4.0000 mg | ORAL_TABLET | Freq: Four times a day (QID) | ORAL | Status: DC | PRN

## 2014-10-18 MED ORDER — HYDROMORPHONE HCL 1 MG/ML IJ SOLN
0.5000 mg | INTRAMUSCULAR | Status: DC | PRN
  Administered 2014-10-19: 1 mg via INTRAVENOUS
  Filled 2014-10-18: qty 1

## 2014-10-18 MED ORDER — AMITRIPTYLINE HCL 25 MG PO TABS
25.0000 mg | ORAL_TABLET | Freq: Every day | ORAL | Status: DC
  Filled 2014-10-18: qty 1

## 2014-10-18 MED ORDER — CEFAZOLIN SODIUM 1-5 GM-% IV SOLN
1.0000 g | Freq: Four times a day (QID) | INTRAVENOUS | Status: AC
  Administered 2014-10-19 – 2014-10-21 (×12): 1 g via INTRAVENOUS
  Filled 2014-10-18 (×14): qty 50

## 2014-10-18 MED ORDER — BISACODYL 5 MG PO TBEC
5.0000 mg | DELAYED_RELEASE_TABLET | Freq: Every day | ORAL | Status: DC | PRN
  Administered 2014-10-21: 5 mg via ORAL
  Filled 2014-10-18 (×3): qty 1

## 2014-10-18 MED ORDER — LEVOTHYROXINE SODIUM 50 MCG PO TABS
50.0000 ug | ORAL_TABLET | Freq: Every day | ORAL | Status: DC
Start: 2014-10-19 — End: 2014-10-18
  Filled 2014-10-18: qty 1

## 2014-10-18 MED ORDER — METHOCARBAMOL 1000 MG/10ML IJ SOLN: 500.0000 mg | Freq: Four times a day (QID) | INTRAVENOUS | Status: DC | PRN

## 2014-10-18 MED ORDER — METHOCARBAMOL 500 MG PO TABS
500.0000 mg | ORAL_TABLET | Freq: Four times a day (QID) | ORAL | Status: DC | PRN
  Administered 2014-10-20 – 2014-10-21 (×2): 500 mg via ORAL
  Filled 2014-10-18 (×2): qty 1

## 2014-10-18 MED ORDER — ONDANSETRON HCL 4 MG/2ML IJ SOLN
INTRAMUSCULAR | Status: DC | PRN
  Administered 2014-10-18: 4 mg via INTRAVENOUS

## 2014-10-18 MED ORDER — LACTATED RINGERS IV SOLN
INTRAVENOUS | Status: DC | PRN
  Administered 2014-10-18: 20:00:00 via INTRAVENOUS

## 2014-10-18 MED ORDER — CEFAZOLIN SODIUM-DEXTROSE 2-3 GM-% IV SOLR
INTRAVENOUS | Status: DC | PRN
  Administered 2014-10-18: 2 g via INTRAVENOUS

## 2014-10-18 MED ORDER — ADULT MULTIVITAMIN W/MINERALS CH: 1.0000 | ORAL_TABLET | Freq: Every day | ORAL | Status: DC

## 2014-10-18 MED ORDER — TIMOLOL MALEATE 0.5 % OP SOLN
1.0000 [drp] | Freq: Every day | OPHTHALMIC | Status: DC
  Filled 2014-10-18: qty 5

## 2014-10-18 MED ORDER — SENNOSIDES-DOCUSATE SODIUM 8.6-50 MG PO TABS: 1.0000 | ORAL_TABLET | Freq: Every evening | ORAL | Status: DC | PRN

## 2014-10-18 MED ORDER — FENTANYL CITRATE 0.05 MG/ML IJ SOLN
INTRAMUSCULAR | Status: DC | PRN
  Administered 2014-10-18 (×3): 50 ug via INTRAVENOUS

## 2014-10-18 MED ORDER — MIDAZOLAM HCL 2 MG/2ML IJ SOLN
INTRAMUSCULAR | Status: AC
  Filled 2014-10-18: qty 2

## 2014-10-18 MED ORDER — LOSARTAN POTASSIUM 50 MG PO TABS: 100.0000 mg | ORAL_TABLET | Freq: Every day | ORAL | Status: DC

## 2014-10-18 MED ORDER — ONDANSETRON HCL 4 MG/2ML IJ SOLN
INTRAMUSCULAR | Status: AC
  Filled 2014-10-18: qty 2

## 2014-10-18 MED ORDER — SODIUM CHLORIDE 0.9 % IV SOLN: INTRAVENOUS | Status: DC

## 2014-10-18 MED ORDER — LIDOCAINE HCL (CARDIAC) 20 MG/ML IV SOLN
INTRAVENOUS | Status: DC | PRN
  Administered 2014-10-18: 60 mg via INTRAVENOUS

## 2014-10-18 MED ORDER — ASPIRIN 81 MG PO CHEW: 81.0000 mg | CHEWABLE_TABLET | Freq: Every day | ORAL | Status: DC

## 2014-10-18 MED ORDER — METOCLOPRAMIDE HCL 10 MG PO TABS: 5.0000 mg | ORAL_TABLET | Freq: Three times a day (TID) | ORAL | Status: DC | PRN

## 2014-10-18 MED ORDER — SODIUM CHLORIDE 0.9 % IR SOLN
Status: DC | PRN
  Administered 2014-10-18: 2000 mL

## 2014-10-18 MED ORDER — SUCCINYLCHOLINE CHLORIDE 20 MG/ML IJ SOLN
INTRAMUSCULAR | Status: DC | PRN
  Administered 2014-10-18: 100 mg via INTRAVENOUS

## 2014-10-18 SURGICAL SUPPLY — 43 items
BLADE SURG 10 STRL SS (BLADE) IMPLANT
BNDG COHESIVE 4X5 TAN STRL (GAUZE/BANDAGES/DRESSINGS) IMPLANT
BNDG COHESIVE 6X5 TAN STRL LF (GAUZE/BANDAGES/DRESSINGS) ×3 IMPLANT
COVER SURGICAL LIGHT HANDLE (MISCELLANEOUS) ×3 IMPLANT
CUFF TOURNIQUET SINGLE 18IN (TOURNIQUET CUFF) IMPLANT
CUFF TOURNIQUET SINGLE 24IN (TOURNIQUET CUFF) IMPLANT
CUFF TOURNIQUET SINGLE 34IN LL (TOURNIQUET CUFF) IMPLANT
CUFF TOURNIQUET SINGLE 44IN (TOURNIQUET CUFF) IMPLANT
DRAPE U-SHAPE 47X51 STRL (DRAPES) ×3 IMPLANT
DRSG ADAPTIC 3X8 NADH LF (GAUZE/BANDAGES/DRESSINGS) ×3 IMPLANT
DURAPREP 26ML APPLICATOR (WOUND CARE) ×3 IMPLANT
ELECT CAUTERY BLADE 6.4 (BLADE) IMPLANT
ELECT REM PT RETURN 9FT ADLT (ELECTROSURGICAL)
ELECTRODE REM PT RTRN 9FT ADLT (ELECTROSURGICAL) IMPLANT
GAUZE SPONGE 4X4 12PLY STRL (GAUZE/BANDAGES/DRESSINGS) ×3 IMPLANT
GLOVE BIOGEL PI IND STRL 9 (GLOVE) ×1 IMPLANT
GLOVE BIOGEL PI INDICATOR 9 (GLOVE) ×2
GLOVE SURG ORTHO 9.0 STRL STRW (GLOVE) ×3 IMPLANT
GOWN STRL REUS W/ TWL XL LVL3 (GOWN DISPOSABLE) ×2 IMPLANT
GOWN STRL REUS W/TWL XL LVL3 (GOWN DISPOSABLE) ×4
HANDPIECE INTERPULSE COAX TIP (DISPOSABLE)
KIT BASIN OR (CUSTOM PROCEDURE TRAY) ×3 IMPLANT
KIT ROOM TURNOVER OR (KITS) ×3 IMPLANT
MANIFOLD NEPTUNE II (INSTRUMENTS) ×3 IMPLANT
NS IRRIG 1000ML POUR BTL (IV SOLUTION) ×3 IMPLANT
PACK ORTHO EXTREMITY (CUSTOM PROCEDURE TRAY) ×3 IMPLANT
PAD ABD 8X10 STRL (GAUZE/BANDAGES/DRESSINGS) ×3 IMPLANT
PAD ARMBOARD 7.5X6 YLW CONV (MISCELLANEOUS) ×6 IMPLANT
PADDING CAST ABS 6INX4YD NS (CAST SUPPLIES) ×2
PADDING CAST ABS COTTON 6X4 NS (CAST SUPPLIES) ×1 IMPLANT
PADDING CAST COTTON 6X4 STRL (CAST SUPPLIES) ×3 IMPLANT
SET HNDPC FAN SPRY TIP SCT (DISPOSABLE) IMPLANT
SPONGE GAUZE 4X4 12PLY STER LF (GAUZE/BANDAGES/DRESSINGS) ×3 IMPLANT
SPONGE LAP 18X18 X RAY DECT (DISPOSABLE) ×3 IMPLANT
STOCKINETTE IMPERVIOUS 9X36 MD (GAUZE/BANDAGES/DRESSINGS) IMPLANT
TOWEL OR 17X24 6PK STRL BLUE (TOWEL DISPOSABLE) ×3 IMPLANT
TOWEL OR 17X26 10 PK STRL BLUE (TOWEL DISPOSABLE) ×3 IMPLANT
TUBE ANAEROBIC SPECIMEN COL (MISCELLANEOUS) IMPLANT
TUBE CONNECTING 12'X1/4 (SUCTIONS) ×1
TUBE CONNECTING 12X1/4 (SUCTIONS) ×2 IMPLANT
UNDERPAD 30X30 INCONTINENT (UNDERPADS AND DIAPERS) ×3 IMPLANT
WATER STERILE IRR 1000ML POUR (IV SOLUTION) ×3 IMPLANT
YANKAUER SUCT BULB TIP NO VENT (SUCTIONS) ×3 IMPLANT

## 2014-10-18 NOTE — Transfer of Care (Signed)
Immediate Anesthesia Transfer of Care Note  Patient: [name]  Procedure(s) Performed: Procedure(s): IRRIGATION AND DEBRIDEMENT RIGHT KNEE PREPATELLA BURSA INFECTION (Right)  Patient Location: PACU  Anesthesia Type:General  Level of Consciousness: awake and alert   Airway & Oxygen Therapy: Patient Spontanous Breathing and Patient connected to face mask oxygen  Post-op Assessment: Report given to PACU RN and Post -op Vital signs reviewed and stable  Post vital signs: Reviewed and stable  Complications: No apparent anesthesia complications

## 2014-10-18 NOTE — Progress Notes (Signed)
Pre visit review using our clinic review tool, if applicable. No additional management support is needed unless otherwise documented below in the visit note. 

## 2014-10-18 NOTE — Anesthesia Procedure Notes (Signed)
Procedure Name: Intubation Date/Time: 10/18/2014 8:33 PM Performed by: Manuela Schwartz B Pre-anesthesia Checklist: Patient identified, Emergency Drugs available, Suction available, Patient being monitored and Timeout performed Patient Re-evaluated:Patient Re-evaluated prior to inductionOxygen Delivery Method: Circle system utilized Preoxygenation: Pre-oxygenation with 100% oxygen Intubation Type: IV induction and Rapid sequence Laryngoscope Size: Mac and 3 Grade View: Grade II Tube type: Oral Tube size: 7.5 mm Number of attempts: 1 Airway Equipment and Method: Stylet Placement Confirmation: ETT inserted through vocal cords under direct vision,  positive ETCO2 and breath sounds checked- equal and bilateral Secured at: 23 cm Tube secured with: Tape Dental Injury: Teeth and Oropharynx as per pre-operative assessment

## 2014-10-18 NOTE — Progress Notes (Signed)
   Subjective:    Patient ID: [name], male    DOB: 04/08/1946, 69 y.o.   MRN: [id number]  HPI Here with his wife for a painful swollen right knee. Due to his Parkinsons he falls frequently, and he feel on this right knee 3 times this past weekend, twice on concrete and once on gravel. The knee has been swollen and too painful to walk on. He is using a cane but requires a lot of assitance. He has had fevers as high as 100 degrees, and he is using Advil and ice packs.    Review of Systems  Constitutional: Negative.   Musculoskeletal: Positive for joint swelling and arthralgias.       Objective:   Physical Exam  Constitutional: He appears well-developed and well-nourished.  In pain  Musculoskeletal:  The right knee is red, hot, swollen and quite tender. ROM is limited and there is a lot of guarding          Assessment & Plan:  He at least has cellulitis and possibly has a septic knee. He will be seen urgently this afternoon by Dr. Rush Farmer at [facility].

## 2014-10-18 NOTE — Op Note (Signed)
10/18/2014  8:50 PM  PATIENT:  [name]    PRE-OPERATIVE DIAGNOSIS:  Septic prepatellar bursal infection  POST-OPERATIVE DIAGNOSIS:  Same  PROCEDURE:  IRRIGATION AND DEBRIDEMENT RIGHT KNEE PREPATELLA BURSA INFECTION  SURGEON:  [name], MD  PHYSICIAN ASSISTANT:None ANESTHESIA:   General  PREOPERATIVE INDICATIONS:  [name] is a  69 y.o. male with a diagnosis of Right knee bursal infection who failed conservative measures and elected for surgical management.    The risks benefits and alternatives were discussed with the patient preoperatively including but not limited to the risks of infection, bleeding, nerve injury, cardiopulmonary complications, the need for revision surgery, among others, and the patient was willing to proceed.  OPERATIVE IMPLANTS: None  OPERATIVE FINDINGS: Purulent abscess cultures obtained 2 prior to starting antibiotics  OPERATIVE PROCEDURE: Patient was brought to the operating room and underwent a general anesthetic. After adequate levels of anesthesia were obtained patient's right lower extremity was prepped using DuraPrep draped into a sterile field. A timeout was called. A midline incision was made patient had purulent abscess of the prepatellar bursa right knee. This was debrided with a Ronjair of soft tissue. The wound was irrigated with normal saline. Cultures were obtained 2 and then antibiotics were provided. The wound was closed loosely with a Penrose drain in place. A sterile compressive dressing was applied. Patient was extubated taken to the PACU in stable condition.

## 2014-10-18 NOTE — Anesthesia Preprocedure Evaluation (Signed)
Anesthesia Evaluation  Patient identified by MRN, date of birth, ID band Patient awake    Reviewed: Allergy & Precautions, NPO status , Patient's Chart, lab work & pertinent test results  Airway Mallampati: II   Neck ROM: full    Dental   Pulmonary shortness of breath, sleep apnea ,          Cardiovascular hypertension,     Neuro/Psych Parkinson's dz    GI/Hepatic   Endo/Other    Renal/GU      Musculoskeletal   Abdominal   Peds  Hematology   Anesthesia Other Findings   Reproductive/Obstetrics                             Anesthesia Physical Anesthesia Plan  ASA: II  Anesthesia Plan: General   Post-op Pain Management:    Induction: Intravenous  Airway Management Planned: Oral ETT  Additional Equipment:   Intra-op Plan:   Post-operative Plan: Extubation in OR  Informed Consent: I have reviewed the patients History and Physical, chart, labs and discussed the procedure including the risks, benefits and alternatives for the proposed anesthesia with the patient or authorized representative who has indicated his/her understanding and acceptance.     Plan Discussed with: CRNA, Anesthesiologist and Surgeon  Anesthesia Plan Comments:         Anesthesia Quick Evaluation

## 2014-10-19 ENCOUNTER — Encounter (HOSPITAL_COMMUNITY): Payer: Self-pay | Admitting: Orthopedic Surgery

## 2014-10-19 LAB — GLUCOSE, CAPILLARY
GLUCOSE-CAPILLARY: 103 mg/dL — AB (ref 70–99)
Glucose-Capillary: 98 mg/dL (ref 70–99)
Glucose-Capillary: 87 mg/dL (ref 70–99)

## 2014-10-19 MED ORDER — LEVOTHYROXINE SODIUM 50 MCG PO TABS
50.0000 ug | ORAL_TABLET | Freq: Every day | ORAL | Status: DC
  Administered 2014-10-20 – 2014-10-22 (×3): 50 ug via ORAL
  Filled 2014-10-19 (×4): qty 1

## 2014-10-19 MED ORDER — TIMOLOL MALEATE 0.5 % OP SOLN
1.0000 [drp] | Freq: Two times a day (BID) | OPHTHALMIC | Status: DC
  Administered 2014-10-19 – 2014-10-22 (×6): 1 [drp] via OPHTHALMIC
  Filled 2014-10-19: qty 5

## 2014-10-19 MED ORDER — NEBIVOLOL HCL 2.5 MG PO TABS
2.5000 mg | ORAL_TABLET | Freq: Every morning | ORAL | Status: DC
  Administered 2014-10-20 – 2014-10-22 (×3): 2.5 mg via ORAL
  Filled 2014-10-19 (×4): qty 1

## 2014-10-19 MED ORDER — LOSARTAN POTASSIUM 50 MG PO TABS
100.0000 mg | ORAL_TABLET | Freq: Every day | ORAL | Status: DC
  Administered 2014-10-20 – 2014-10-21 (×2): 100 mg via ORAL
  Filled 2014-10-19 (×4): qty 2

## 2014-10-19 MED ORDER — LOSARTAN POTASSIUM 50 MG PO TABS: 100.0000 mg | ORAL_TABLET | Freq: Every day | ORAL | Status: DC

## 2014-10-19 NOTE — Progress Notes (Signed)
Patient was unable to void on his own this shift.  We got patient to stand up, ran water, and applied warm compression on lower abd.  He had the urge but could not void.  Did bladder scan; showed 654 mL.  At 0645 removed 700 mL of urine via I&O catheter.  Patient is currently relaxing in bed.

## 2014-10-19 NOTE — Progress Notes (Signed)
Patient ID: [name], male   DOB: [birth year], 69 y.o.   MRN: [id number] Postoperative day 1 irrigation and debridement septic prepatellar bursitis right knee.  Cultures pending. Patient on IV Kefzol.  Patient is extremely unsteady on his gait due to his Parkinson's. Physical therapy for progressive ambulation.

## 2014-10-19 NOTE — Care Management (Signed)
Utilization Review completed   Devora Tortorella,RN, BSN,CCM 

## 2014-10-19 NOTE — Evaluation (Signed)
Physical Therapy Evaluation Patient Details Name: [name] MRN: [id number] DOB: 1945/12/11 Today's Date: 10/19/2014   History of Present Illness  69 y.o. male admitted to [facility] on 10/18/14 s/p multiple falls with right knee trauma and increased redness and swelling.  Suspected to have R knee prepatellar bursa infection.  Now s/p I &D right knee.  Pt with significant PMHx of HTN, glaucoma, Parkinsonism (with up to 10 falls a day at home per wife), pre-diabetes, and lumbar disc disease.  Clinical Impression  Pt is progressing well with his mobility despite PMHx of Parkinsonism.  I did strongly encourage him to use RW as it is safest right now (he is generally opposed to using one despite wife reporting he falls multiple times per day).  He would benefit from follow up PT at home with transition back to neurorehab center for rehab of his knees and falls.  PT to follow acutely for deficits listed below.       Follow Up Recommendations Home health PT;Supervision/Assistance - 24 hour    Equipment Recommendations  Rolling walker with 5" wheels    Recommendations for Other Services   NA    Precautions / Restrictions Precautions Precautions: Fall;Knee Precaution Booklet Issued: Yes (comment) Precaution Comments: knee handout given, exercises reviewed and no pillow under surgical knee reviewed.  Restrictions Weight Bearing Restrictions: Yes RLE Weight Bearing: Weight bearing as tolerated      Mobility   Transfers Overall transfer level: Needs assistance Equipment used: Rolling walker (2 wheeled) Transfers: Sit to/from Stand Sit to Stand: Min assist         General transfer comment: Min assist to support trunk during transitions.  Verbal cues for safest hand placement and assist and cues needed to control descent to sit.   Ambulation/Gait Ambulation/Gait assistance: Min assist Ambulation Distance (Feet): 65 Feet Assistive device: Rolling walker (2 wheeled) Gait  Pattern/deviations: Step-through pattern;Antalgic Gait velocity: decreased Gait velocity interpretation: Below normal speed for age/gender General Gait Details: Pt with what started as toe touch gait pattern on the right and with encouragement, he could do foot flat pattern.  Min assist to support trunk for balance and stabiltiy when stepping/WB over right leg.           Balance Overall balance assessment: Needs assistance Sitting-balance support: Feet supported;No upper extremity supported Sitting balance-Leahy Scale: Fair     Standing balance support: Bilateral upper extremity supported Standing balance-Leahy Scale: Poor                               Pertinent Vitals/Pain Pain Assessment: Faces Faces Pain Scale: Hurts little more Pain Location: right knee Pain Descriptors / Indicators: Aching;Burning Pain Intervention(s): Limited activity within patient's tolerance;Monitored during session;Repositioned    Home Living Family/patient expects to be discharged to:: Private residence Living Arrangements: Spouse/significant other Available Help at Discharge: Family;Available 24 hours/day Type of Home: House Home Access: Stairs to enter Entrance Stairs-Rails: None Entrance Stairs-Number of Steps: 2 Home Layout: Two level;Able to live on main level with bedroom/bathroom Home Equipment: Kasandra Knudsen - single point      Prior Function Level of Independence: Independent         Comments: Pt still drives, independent at home, however, this has to be prefaced with the fact that his wife reports he falls multiple times per day.  They just purchased a cane and he is very reluctant to use a RW.  He has participated multiple times  in [facility] parkinson's program [facility] parkinson's exercise group there.          Extremity/Trunk Assessment   Upper Extremity Assessment: Generalized weakness           Lower Extremity  Assessment: Generalized weakness;RLE deficits/detail RLE Deficits / Details: right leg with normal post op pain and weakness, encouraged elevation as his right ankle is swollen.  Per wife he uses a DF assist strap on his right shoe normally because he has difficulty DF right foot and typically right leg is weaker than left leg (he tends to fall to this side and land on this knee frequently at home per wife). DF strength 3-/5, knee strength 3-/5, hip flexion 3-/5    Cervical / Trunk Assessment: Normal  Communication   Communication: Expressive difficulties;Other (comment) (Parkinsonian-type stuttering and low volume. )  Cognition Arousal/Alertness: Awake/alert Behavior During Therapy: WFL for tasks assessed/performed Overall Cognitive Status: Within Functional Limits for tasks assessed                      General Comments General comments (skin integrity, edema, etc.): At end of session left pt in extension stretch with pillow under his ankle to encourage straightening.     Exercises Total Joint Exercises Ankle Circles/Pumps: AROM;AAROM;Left;Right;10 reps;Supine;Seated Quad Sets: AROM;Right;10 reps;Supine Towel Squeeze: AROM;Both;10 reps;Supine Short Arc Quad: AROM;Right;10 reps;Supine Heel Slides: AAROM;Right;10 reps;Supine Hip ABduction/ADduction: AAROM;Right;10 reps;Supine Straight Leg Raises: AROM;AAROM;Right;10 reps;Supine Long Arc Quad: AROM;Right;10 reps;Seated      Assessment/Plan    PT Assessment Patient needs continued PT services  PT Diagnosis Difficulty walking;Abnormality of gait;Generalized weakness;Acute pain   PT Problem List Decreased strength;Decreased activity tolerance;Decreased range of motion;Decreased balance;Decreased mobility;Decreased knowledge of use of DME;Decreased knowledge of precautions;Pain  PT Treatment Interventions DME instruction;Gait training;Stair training;Functional mobility training;Therapeutic activities;Therapeutic exercise;Balance  training;Neuromuscular re-education;Patient/family education;Modalities;Manual techniques   PT Goals (Current goals can be found in the Care Plan section) Acute Rehab PT Goals Patient Stated Goal: to get his knee pain better PT Goal Formulation: With patient/family Time For Goal Achievement: 10/26/14 Potential to Achieve Goals: Good    Frequency Min 3X/week           End of Session Equipment Utilized During Treatment: Gait belt Activity Tolerance: Patient limited by fatigue;Patient limited by pain Patient left: in chair;with call bell/phone within reach;with family/visitor present Nurse Communication: Mobility status;Patient requests pain meds         Time: 7591-6384 PT Time Calculation (min) (ACUTE ONLY): 39 min   Charges:   PT Evaluation $Initial PT Evaluation Tier I: 1 Procedure PT Treatments $Gait Training: 8-22 mins $Therapeutic Exercise: 8-22 mins        Jasiah Elsen B. [place], [place], DPT [phone]   10/19/2014, 12:07 PM

## 2014-10-20 LAB — GLUCOSE, CAPILLARY: GLUCOSE-CAPILLARY: 76 mg/dL (ref 70–99)

## 2014-10-20 MED ORDER — BISACODYL 10 MG RE SUPP
10.0000 mg | Freq: Every day | RECTAL | Status: DC | PRN
  Administered 2014-10-20: 10 mg via RECTAL
  Filled 2014-10-20: qty 1

## 2014-10-20 NOTE — Progress Notes (Signed)
10/20/14 Spoke with patient about HHC. He wants to discuss decision with his wife. gave patient list of [facility] agencies in [place]. Will check with patient on 10/21/14 to set up HHPT. Fuller Plan RN, BSN, CCM

## 2014-10-20 NOTE — Progress Notes (Signed)
Physical Therapy Treatment Patient Details Name: [name] MRN: [id number] DOB: 06/29/1946 Today's Date: 10/20/2014    History of Present Illness 69 y.o. male admitted to [facility] on 10/18/14 s/p multiple falls with right knee trauma and increased redness and swelling.  Suspected to have R knee prepatellar bursa infection.  Now s/p I &D right knee.  Pt with significant PMHx of HTN, glaucoma, Parkinsonism (with up to 10 falls a day at home per wife), pre-diabetes, and lumbar disc disease.    PT Comments    Pt is progressing well with his mobility and was able to initiate stair training today as well as continue to progress gait distance and R knee exercises.  PT will continue to follow acutely to progress gait, mobility, and exercises until d/c home.  I would like to go over stairs with wife present tomorrow.   Follow Up Recommendations  Home health PT;Supervision/Assistance - 24 hour     Equipment Recommendations  Rolling walker with 5" wheels    Recommendations for Other Services   NA     Precautions / Restrictions Precautions Precautions: Fall;Knee Precaution Booklet Issued: Yes (comment) Precaution Comments: knee handout given, exercises reviewed and no pillow under surgical knee reviewed.  Restrictions RLE Weight Bearing: Weight bearing as tolerated    Mobility  Bed Mobility Overal bed mobility: Needs Assistance Bed Mobility: Supine to Sit     Supine to sit: Min guard     General bed mobility comments: Min guard assist to support trunk during transition to EOB. Pt using bed rail and HOB ~20 degrees.   Transfers Overall transfer level: Needs assistance Equipment used: Rolling walker (2 wheeled) Transfers: Sit to/from Stand Sit to Stand: Min guard         General transfer comment: Min guard assist for safety during slow transitions.  Verbal cues for safe hand placement.   Ambulation/Gait Ambulation/Gait assistance: Min guard Ambulation Distance (Feet): 150  Feet Assistive device: Rolling walker (2 wheeled) Gait Pattern/deviations: Step-through pattern;Shuffle;Trunk flexed Gait velocity: decreased Gait velocity interpretation: Below normal speed for age/gender General Gait Details: Verbal cues for upright posture and safety when turning and backing up.  He does freeze at times and needs cues to get re started.    Stairs Stairs: Yes Stairs assistance: Min assist Stair Management: One rail Right;Step to pattern;Forwards (hand held assit left) Number of Stairs: 2 General stair comments: Pt reports his son is currently putting up a handle that he can pull on at his entry way at home on the right side.  Verbal cues for safest LE sequencing.         Balance Overall balance assessment: Needs assistance Sitting-balance support: Feet supported;No upper extremity supported Sitting balance-Leahy Scale: Good     Standing balance support: Bilateral upper extremity supported;Single extremity supported;No upper extremity supported Standing balance-Leahy Scale: Fair                      Cognition Arousal/Alertness: Awake/alert Behavior During Therapy: WFL for tasks assessed/performed Overall Cognitive Status: Within Functional Limits for tasks assessed                      Exercises Total Joint Exercises Ankle Circles/Pumps: AROM;AAROM;Left;Right;10 reps;Supine;Seated Quad Sets: AROM;Right;10 reps;Supine Short Arc Quad: AROM;Right;10 reps;Supine Heel Slides: AAROM;Right;10 reps;Supine Hip ABduction/ADduction: AAROM;Right;10 reps;Supine Straight Leg Raises: AROM;AAROM;Right;10 reps;Supine Long Arc Quad: AROM;Right;10 reps;Seated        Pertinent Vitals/Pain Pain Assessment: 0-10 Pain Score: 4  Pain  Location: right knee Pain Descriptors / Indicators: Aching;Burning Pain Intervention(s): Limited activity within patient's tolerance;Monitored during session;Repositioned           PT Goals (current goals can now be  found in the care plan section) Acute Rehab PT Goals Patient Stated Goal: to get his knee pain better Progress towards PT goals: Progressing toward goals    Frequency  Min 3X/week    PT Plan Current plan remains appropriate       End of Session Equipment Utilized During Treatment: Gait belt Activity Tolerance: Patient limited by fatigue;Patient limited by pain Patient left: in chair;with call bell/phone within reach;with chair alarm set     Time: 1441-1524 PT Time Calculation (min) (ACUTE ONLY): 43 min  Charges:  $Gait Training: 23-37 mins $Therapeutic Exercise: 8-22 mins                      Tyrina Hines B. [place], [place], DPT [phone]   10/20/2014, 4:45 PM

## 2014-10-20 NOTE — H&P (Signed)
[name] is an 69 y.o. male.   Chief Complaint: Right knee pain swelling redness HPI: Patient is a 69 year old gentleman who presents with acute cellulitis of the right knee with swelling of the prepatellar bursa and pain.  Past Medical History  Diagnosis Date  . HTN (hypertension)   . Glaucoma   . OSA (obstructive sleep apnea)   . Allergic rhinitis   . Parkinsonism 01/16/2013  . Hyperlipidemia   . Thyroid disease     HYPOTHYROID  . Hypogonadism male   . Pre-diabetes     Past Surgical History  Procedure Laterality Date  . Uvulopalatopharyngoplasty    . Lumbar disc surgery    . Cataract extraction    . I&d extremity Right 10/18/2014    Procedure: IRRIGATION AND DEBRIDEMENT RIGHT KNEE PREPATELLA BURSA INFECTION;  Surgeon: [name], MD;  Location: [place];  Service: Orthopedics;  Laterality: Right;    Family History  Problem Relation Age of Onset  . Emphysema Mother   . COPD Mother   . Heart disease Mother   . Liver cancer Father   . Cancer Father   . Diabetes Father   . Heart disease Sister   . COPD Sister   . Heart attack Sister   . Hypertension Mother   . Hypertension Father   . Hypertension Sister   . Stroke Neg Hx    Social History:  reports that he has never smoked. He has never used smokeless tobacco. He reports that he drinks alcohol. He reports that he does not use illicit drugs.  Allergies:  Allergies  Allergen Reactions  . Ambien [Zolpidem Tartrate]     unknown  . Atenolol     unknown  . Lunesta [Eszopiclone]     unknown  . Oxazepam     unknown  . Requip [Ropinirole Hcl]     unknown  . Ziac [Bisoprolol-Hydrochlorothiazide]     unknown    Medications Prior to Admission  Medication Sig Dispense Refill  . amitriptyline (ELAVIL) 25 MG tablet TAKE 1 TABLET BY MOUTH AT BEDTIME 90 tablet 1  . aspirin 81 MG tablet Take 81 mg by mouth daily.      . fexofenadine (ALLEGRA) 180 MG tablet Take 60 mg by mouth daily.     . fish oil-omega-3 fatty acids  1000 MG capsule Take 1,200 mg by mouth daily. 2400 mg on Tuesday, Thursday, and saturday    . fluticasone (FLONASE) 50 MCG/ACT nasal spray USE 1-2 SPRAYS IN EACH NOSTRIL EVERY DAY 16 g 6  . GUAIFENESIN PO Take 400 mg by mouth 3 (three) times daily as needed. Congestion    . levothyroxine (SYNTHROID, LEVOTHROID) 50 MCG tablet TAKE 1 TABLET BY MOUTH EVERY DAY 30 tablet 0  . losartan (COZAAR) 100 MG tablet TAKE 1 TABLET BY MOUTH DAILY 30 tablet 5  . magnesium gluconate (MAGONATE) 500 MG tablet Take 1,000 mg by mouth daily.     . Multiple Vitamins-Minerals (MULTIVITAMIN WITH MINERALS) tablet Take 1 tablet by mouth daily.      . Naproxen Sodium (ALEVE PO) Take 1 tablet by mouth daily as needed. For pain    . nebivolol (BYSTOLIC) 2.5 MG tablet Take 1 tablet (2.5 mg total) by mouth daily. 30 tablet 6  . tadalafil (CIALIS) 5 MG tablet Take 5 mg by mouth daily.    Marland Kitchen testosterone cypionate (DEPO-TESTOSTERONE) 200 MG/ML injection Inject 2 mLs (400 mg total) into the muscle every 21 ( twenty-one) days. 10 mL 5  .  timolol (TIMOPTIC) 0.5 % ophthalmic solution Place 1 drop into both eyes.   12  . Cholecalciferol (VITAMIN D3) 10000 UNITS capsule Take 5,000 Units by mouth See admin instructions. 3 times a week. No specific days      Results for orders placed or performed during the hospital encounter of 10/18/14 (from the past 48 hour(s))  CBC     Status: Abnormal   Collection Time: 10/18/14  7:05 PM  Result Value Ref Range   WBC 13.0 (H) 4.0 - 10.5 K/uL   RBC 4.93 4.22 - 5.81 MIL/uL   Hemoglobin 14.5 13.0 - 17.0 g/dL   HCT 41.9 39.0 - 52.0 %   MCV 85.0 78.0 - 100.0 fL   MCH 29.4 26.0 - 34.0 pg   MCHC 34.6 30.0 - 36.0 g/dL   RDW 14.4 11.5 - 15.5 %   Platelets 160 150 - 400 K/uL  Basic metabolic panel     Status: Abnormal   Collection Time: 10/18/14  7:05 PM  Result Value Ref Range   Sodium 136 135 - 145 mmol/L    Comment: Please note change in reference range.   Potassium 3.5 3.5 - 5.1 mmol/L     Comment: Please note change in reference range.   Chloride 104 96 - 112 mEq/L   CO2 21 19 - 32 mmol/L   Glucose, Bld 104 (H) 70 - 99 mg/dL   BUN 14 6 - 23 mg/dL   Creatinine, Ser 0.98 0.50 - 1.35 mg/dL   Calcium 8.4 8.4 - 10.5 mg/dL   GFR calc non Af Amer 83 (L) >90 mL/min   GFR calc Af Amer >90 >90 mL/min    Comment: (NOTE) The eGFR has been calculated using the CKD EPI equation. This calculation has not been validated in all clinical situations. eGFR's persistently <90 mL/min signify possible Chronic Kidney Disease.    Anion gap 11 5 - 15  Anaerobic culture     Status: None (Preliminary result)   Collection Time: 10/18/14  8:42 PM  Result Value Ref Range   Specimen Description SYNOVIAL FLUID KNEE RIGHT    Special Requests RECEIVED ON SWAB    Gram Stain      MODERATE WBC PRESENT,BOTH PMN AND MONONUCLEAR FEW GRAM POSITIVE COCCI IN PAIRS IN CLUSTERS Performed at [facility]    Culture      NO ANAEROBES ISOLATED; CULTURE IN PROGRESS FOR 5 DAYS Performed at [facility]    Report Status PENDING   Body fluid culture     Status: None (Preliminary result)   Collection Time: 10/18/14  8:42 PM  Result Value Ref Range   Specimen Description SYNOVIAL FLUID KNEE RIGHT    Special Requests RECEIVED ON SWAB    Gram Stain      MODERATE WBC PRESENT,BOTH PMN AND MONONUCLEAR FEW GRAM POSITIVE COCCI IN PAIRS IN CLUSTERS Gram Stain Report Called to,Read Back By and Verified With: Gram Stain Report Called to,Read Back By and Verified With: SYLVIA HOWELL RN 10/19/14 8:35AM BY Opp Performed at [facility]    Culture      Culture reincubated for better growth Performed at [facility]    Report Status PENDING   AFB culture with smear     Status: None (Preliminary result)   Collection Time: 10/18/14  8:42 PM  Result Value Ref Range   Specimen Description SYNOVIAL FLUID KNEE RIGHT    Special Requests RECEIVED ON SWAB    Acid Fast Smear  NO ACID FAST  BACILLI SEEN Performed at [facility]    Culture      CULTURE WILL BE EXAMINED FOR 6 WEEKS BEFORE ISSUING A FINAL REPORT Performed at [facility]    Report Status PENDING   Glucose, capillary     Status: None   Collection Time: 10/18/14  9:04 PM  Result Value Ref Range   Glucose-Capillary 92 70 - 99 mg/dL  Glucose, capillary     Status: None   Collection Time: 10/19/14 12:12 PM  Result Value Ref Range   Glucose-Capillary 98 70 - 99 mg/dL  Glucose, capillary     Status: Abnormal   Collection Time: 10/19/14  5:01 PM  Result Value Ref Range   Glucose-Capillary 103 (H) 70 - 99 mg/dL  Glucose, capillary     Status: None   Collection Time: 10/19/14 10:30 PM  Result Value Ref Range   Glucose-Capillary 87 70 - 99 mg/dL   No results found.  Review of Systems  All other systems reviewed and are negative.   Blood pressure 127/68, pulse 84, temperature 98.6 F (37 C), temperature source Oral, resp. rate 18, SpO2 94 %. Physical Exam  On examination patient has an abscess of the prepatellar bursa right knee with cellulitis pain and swelling. Assessment/Plan Assessment: Abscess right knee prepatellar bursa.  Plan: Patient will be admitted plan for irrigation and debridement and excision of the prepatellar bursa. Risks and benefits were discussed including risk of persistent infection. Patient states he understands was to proceed at this time.  Jonell Krontz V 10/20/2014, 6:22 AM

## 2014-10-20 NOTE — Progress Notes (Signed)
Patient ID: [name], male   DOB: [birth year], 69 y.o.   MRN: [id number] Postoperative day 2 irrigation debridement abscess prepatellar bursa right knee. Cultures are positive for gram-positive cocci. Sensitivity pending. Change dressing today.

## 2014-10-20 NOTE — Progress Notes (Deleted)
CARE MANAGEMENT NOTE 10/20/2014  Patient:  [name], [name]   Account Number:  [id number]  Date Initiated:  10/16/2014  Documentation initiated by:  [facility]  Subjective/Objective Assessment:   rt hip fx, s/p ORIF rt hip     Action/Plan:   PT/OT evals- inpatient rehab referral  inpatient rehab denied by insurance  going to SNF   Anticipated DC Date:  10/19/2014   Anticipated DC Plan:  [place]  In-house referral  Clinical Social Worker      DC Planning Services  CM consult      Choice offered to / List presented to:             Status of service:  Completed, signed off Medicare Important Message given?   (If response is "NO", the following Medicare IM given date fields will be blank) Date Medicare IM given:   Medicare IM given by:   Date Additional Medicare IM given:   Additional Medicare IM given by:    Discharge Disposition:  [place]  Per UR Regulation:  Reviewed for med. necessity/level of care/duration of stay

## 2014-10-20 NOTE — Anesthesia Postprocedure Evaluation (Signed)
Anesthesia Post Note  Patient: [name]  Procedure(s) Performed: Procedure(s) (LRB): IRRIGATION AND DEBRIDEMENT RIGHT KNEE PREPATELLA BURSA INFECTION (Right)  Anesthesia type: General  Patient location: PACU  Post pain: Pain level controlled and Adequate analgesia  Post assessment: Post-op Vital signs reviewed, Patient's Cardiovascular Status Stable, Respiratory Function Stable, Patent Airway and Pain level controlled  Last Vitals:  Filed Vitals:   10/20/14 0559  BP: 127/68  Pulse: 84  Temp: 37 C  Resp: 18    Post vital signs: Reviewed and stable  Level of consciousness: awake, alert  and oriented  Complications: No apparent anesthesia complications

## 2014-10-21 ENCOUNTER — Encounter: Payer: Self-pay | Admitting: Internal Medicine

## 2014-10-21 LAB — GLUCOSE, CAPILLARY: GLUCOSE-CAPILLARY: 128 mg/dL — AB (ref 70–99)

## 2014-10-21 LAB — BODY FLUID CULTURE

## 2014-10-21 NOTE — Progress Notes (Signed)
Physical Therapy Treatment Patient Details Name: [name] MRN: [id number] DOB: [birth year] Today's Date: 10/21/2014    History of Present Illness 69 y.o. male admitted to [facility] on 10/18/14 s/p multiple falls with right knee trauma and increased redness and swelling.  Suspected to have R knee prepatellar bursa infection.  Now s/p I &D right knee.  Pt with significant PMHx of HTN, glaucoma, Parkinsonism (with up to 10 falls a day at home per wife), pre-diabetes, and lumbar disc disease.    PT Comments    Pt is progressing well with his mobility, min guard assist for gait with RW, min assist on stairs for home entry.  Wife present for session today and educated on exercises and stair sequencing.  Pt will likely d/c in AM and would benefit from one more PT session to help progress gait, exercises, stairs and mobility prior to d/c home with wife's assist, RW, and HHPT f/u at discharge.   Follow Up Recommendations  Home health PT;Supervision/Assistance - 24 hour     Equipment Recommendations  Rolling walker with 5" wheels    Recommendations for Other Services   NA     Precautions / Restrictions Precautions Precautions: Fall;Knee Precaution Booklet Issued: Yes (comment) Precaution Comments: knee handout given, exercises reviewed and no pillow under surgical knee reviewed.  Restrictions Weight Bearing Restrictions: Yes RLE Weight Bearing: Weight bearing as tolerated    Mobility  Bed Mobility Overal bed mobility: Needs Assistance Bed Mobility: Supine to Sit     Supine to sit: Supervision     General bed mobility comments: supervision for safety.  Extra time needed to complete task, pt relying on one arm pulling on bed rail to get to sitting.    Transfers Overall transfer level: Needs assistance Equipment used: Rolling walker (2 wheeled) Transfers: Sit to/from Stand Sit to Stand: Min guard         General transfer comment: Min guard assist for safety, multiple slow attempts  required to get up on his feet due to difficulty with anterior translation of trunk.   Ambulation/Gait Ambulation/Gait assistance: Min guard Ambulation Distance (Feet): 200 Feet Assistive device: Standard walker Gait Pattern/deviations: Step-through pattern;Antalgic;Trunk flexed Gait velocity: decreased Gait velocity interpretation: Below normal speed for age/gender General Gait Details: Verbal cues for upright posture, eyes looking up insteady of down at feet.    Stairs Stairs: Yes Stairs assistance: Min assist Stair Management: One rail Right;Forwards;Step to pattern Number of Stairs: 2 General stair comments: Pt was able to report correct lower extremity sequencing pattern and min hand held assist provided on his left.  Wife present for education.          Balance Overall balance assessment: Needs assistance Sitting-balance support: Feet supported;No upper extremity supported Sitting balance-Leahy Scale: Good     Standing balance support: Bilateral upper extremity supported;No upper extremity supported;Single extremity supported Standing balance-Leahy Scale: Fair                      Cognition Arousal/Alertness: Awake/alert Behavior During Therapy: WFL for tasks assessed/performed Overall Cognitive Status: Within Functional Limits for tasks assessed                      Exercises Total Joint Exercises Ankle Circles/Pumps: AROM;AAROM;Left;Right;10 reps;Supine;Seated Quad Sets: AROM;Right;10 reps;Supine Short Arc Quad: AROM;Right;10 reps;Supine Heel Slides: AAROM;Right;10 reps;Supine Hip ABduction/ADduction: AAROM;Right;10 reps;Supine Straight Leg Raises: AROM;AAROM;Right;10 reps;Supine Long Arc Quad: AROM;Right;10 reps;Seated Knee Flexion: AROM;AAROM;10 reps;Seated  Pertinent Vitals/Pain Pain Assessment: 0-10 Pain Score: 10-Worst pain ever Pain Location: right knee with mobility Pain Descriptors / Indicators: Aching;Burning Pain  Intervention(s): Limited activity within patient's tolerance;Monitored during session;Repositioned           PT Goals (current goals can now be found in the care plan section) Acute Rehab PT Goals Patient Stated Goal: to get his knee pain better Progress towards PT goals: Progressing toward goals    Frequency  Min 3X/week    PT Plan Current plan remains appropriate       End of Session Equipment Utilized During Treatment: Gait belt Activity Tolerance: Patient limited by fatigue;Patient limited by pain Patient left: in chair;with call bell/phone within reach;with family/visitor present;with chair alarm set     Time: 8453-6468 PT Time Calculation (min) (ACUTE ONLY): 34 min  Charges:  $Gait Training: 8-22 mins $Therapeutic Exercise: 8-22 mins                      Deiondre Harrower B. Crane, [place], DPT [phone]   10/21/2014, 12:35 PM

## 2014-10-21 NOTE — Progress Notes (Signed)
Patient ID: [name], male   DOB: [birth year], 69 y.o.   MRN: [id number] Cultures positive for staph aureus. Sensitivities pending. Patient slow with physical therapy due to his Parkinson's. Patient to continue with therapy today anticipate discharge to home on Friday.

## 2014-10-22 MED ORDER — DOXYCYCLINE HYCLATE 50 MG PO CAPS: 100.0000 mg | ORAL_CAPSULE | Freq: Two times a day (BID) | ORAL | Status: DC

## 2014-10-22 MED ORDER — OXYCODONE-ACETAMINOPHEN 5-325 MG PO TABS: 1.0000 | ORAL_TABLET | ORAL | Status: DC | PRN

## 2014-10-22 NOTE — Progress Notes (Signed)
Physical Therapy Treatment Patient Details Name: [name] MRN: [id number] DOB: 1946-04-18 Today's Date: 10/22/2014    History of Present Illness 69 y.o. male admitted to [facility] on 10/18/14 s/p multiple falls with right knee trauma and increased redness and swelling.  Suspected to have R knee prepatellar bursa infection.  Now s/p I &D right knee.  Pt with significant PMHx of HTN, glaucoma, Parkinsonism (with up to 10 falls a day at home per wife), pre-diabetes, and lumbar disc disease.    PT Comments    Pt is progressing well with his mobility.  His gait speed has improved today and he is less antalgic than previous sessions.  We reviewed stair training and exercises and he is due to d/c home with his wife later today.  PT will continue to follow acutely until d/c.  Follow Up Recommendations  Home health PT;Supervision/Assistance - 24 hour     Equipment Recommendations  Rolling walker with 5" wheels    Recommendations for Other Services   NA     Precautions / Restrictions Precautions Precautions: Fall;Knee Precaution Booklet Issued: Yes (comment) Restrictions Weight Bearing Restrictions: Yes RLE Weight Bearing: Weight bearing as tolerated    Mobility         Transfers Overall transfer level: Needs assistance Equipment used: Rolling walker (2 wheeled) Transfers: Sit to/from Stand Sit to Stand: Supervision         General transfer comment: supervision for safety, cues to control descent to sit.   Ambulation/Gait Ambulation/Gait assistance: Supervision Ambulation Distance (Feet): 510 Feet Assistive device: Rolling walker (2 wheeled) Gait Pattern/deviations: Step-through pattern;Shuffle;Decreased dorsiflexion - right;Antalgic     General Gait Details: Verbal cues for upright posture, closer proximity to RW.  Supervision for safety.    Stairs Stairs: Yes Stairs assistance: Min assist Stair Management: One rail Right;Forwards;Step to pattern Number of Stairs:  5 General stair comments: Verbal reminders while doing the stairs of safest LE sequencing.          Balance Overall balance assessment: Needs assistance Sitting-balance support: Feet supported;No upper extremity supported Sitting balance-Leahy Scale: Good     Standing balance support: Bilateral upper extremity supported Standing balance-Leahy Scale: Fair                      Cognition Arousal/Alertness: Awake/alert Behavior During Therapy: WFL for tasks assessed/performed Overall Cognitive Status: Within Functional Limits for tasks assessed                      Exercises Total Joint Exercises Ankle Circles/Pumps: AROM;AAROM;Left;Right;10 reps;Supine;Seated Quad Sets: AROM;Right;10 reps;Supine Short Arc Quad: AROM;Right;10 reps;Supine Heel Slides: AAROM;Right;10 reps;Supine Hip ABduction/ADduction: AAROM;Right;10 reps;Supine Straight Leg Raises: AROM;AAROM;Right;10 reps;Supine Long Arc Quad: AROM;Right;10 reps;Seated Knee Flexion: AROM;AAROM;10 reps;Seated        Pertinent Vitals/Pain Pain Assessment: 0-10 Pain Score: 5  Pain Location: right knee Pain Descriptors / Indicators: Aching;Burning Pain Intervention(s): Limited activity within patient's tolerance;Monitored during session;Repositioned           PT Goals (current goals can now be found in the care plan section) Acute Rehab PT Goals Patient Stated Goal: to get his knee pain better Progress towards PT goals: Progressing toward goals    Frequency  Min 3X/week    PT Plan Current plan remains appropriate       End of Session Equipment Utilized During Treatment: Gait belt Activity Tolerance: Patient limited by fatigue Patient left: in chair;with call bell/phone within reach;with chair alarm set  Time: 0347-4259 PT Time Calculation (min) (ACUTE ONLY): 23 min  Charges:  $Gait Training: 8-22 mins $Therapeutic Exercise: 8-22 mins                      Narek Kniss B. Spade, [place],  DPT [phone]   10/22/2014, 9:09 AM

## 2014-10-22 NOTE — Progress Notes (Signed)
CARE MANAGEMENT NOTE 10/22/2014  Patient:  [name],[name]   Account Number:  [id number]  Date Initiated:  10/20/2014  Documentation initiated by:  [facility]  Subjective/Objective Assessment:   rt knee bursal infection     Action/Plan:   PT eval- recommended HHPT and rolling walker   Anticipated DC Date:  10/22/2014   Anticipated DC Plan:  [place]  CM consult      PAC Choice  [place]   Choice offered to / List presented to:  C-1 Patient   DME arranged  Vassie Moselle      DME agency  [place] arranged  [place].   Status of service:  Completed, signed off Medicare Important Message given?  YES (If response is "NO", the following Medicare IM given date fields will be blank) Date Medicare IM given:  10/21/2014 Medicare IM given by:  [facility] Date Additional Medicare IM given:   Additional Medicare IM given by:    Discharge Disposition:  [place]  Per UR Regulation:  Reviewed for med. necessity/level of care/duration of stay  If discussed at [place] of Stay Meetings, dates discussed:    Comments:  10/21/14 Spoke with patient and wife about HHC. They chose [facility]. Contacted Miranda at [facility] and set up [place]. Contacted Frank at [facility] and requrested rolling walker be delivered to patient's room.  10/20/14 Spoke with patient about HHC. He wants to discuss decision with his wife. gave [facility] list [facility] agencies in [place]. Will check with patient on 10/21/14 to set up HHPT. Fuller Plan RN, BSN, CCM

## 2014-10-22 NOTE — Discharge Summary (Signed)
Physician Discharge Summary  Patient ID: [name] MRN: [id number] DOB/AGE: Jan 08, 1946 69 y.o.  Admit date: 10/18/2014 Discharge date: 10/22/2014  Admission Diagnoses: Prepatellar septic bursitis right knee  Discharge Diagnoses:  Principal Problem:   Infection of right prepatellar bursa   Discharged Condition: stable  Hospital Course: Patient's hospital course was essentially unremarkable. He underwent excision irrigation and debridement of the prepatellar bursa. Postoperatively patient progressed slowly with therapy and was discharged to home in stable condition.  Consults: None  Significant Diagnostic Studies: labs: Routine labs  Treatments: surgery: See operative note  Discharge Exam: Blood pressure 130/76, pulse 80, temperature 98.8 F (37.1 C), temperature source Oral, resp. rate 17, SpO2 95 %. Incision/Wound: dressing clean and dry  Disposition: Final discharge disposition not confirmed  Discharge Instructions    Call MD / Call 911    Complete by:  As directed   If you experience chest pain or shortness of breath, CALL 911 and be transported to the hospital emergency room.  If you develope a fever above 101 F, pus (white drainage) or increased drainage or redness at the wound, or calf pain, call your surgeon's office.     Constipation Prevention    Complete by:  As directed   Drink plenty of fluids.  Prune juice may be helpful.  You may use a stool softener, such as Colace (over the counter) 100 mg twice a day.  Use MiraLax (over the counter) for constipation as needed.     Diet - low sodium heart healthy    Complete by:  As directed      Increase activity slowly as tolerated    Complete by:  As directed      Weight bearing as tolerated    Complete by:  As directed             Medication List    TAKE these medications        ALEVE PO  Take 1 tablet by mouth daily as needed. For pain     amitriptyline 25 MG tablet  Commonly known as:  ELAVIL  TAKE 1  TABLET BY MOUTH AT BEDTIME     aspirin 81 MG tablet  Take 81 mg by mouth daily.     doxycycline 50 MG capsule  Commonly known as:  VIBRAMYCIN  Take 2 capsules (100 mg total) by mouth 2 (two) times daily.     fexofenadine 180 MG tablet  Commonly known as:  ALLEGRA  Take 60 mg by mouth daily.     fish oil-omega-3 fatty acids 1000 MG capsule  Take 1,200 mg by mouth daily. 2400 mg on Tuesday, Thursday, and saturday     fluticasone 50 MCG/ACT nasal spray  Commonly known as:  FLONASE  USE 1-2 SPRAYS IN EACH NOSTRIL EVERY DAY     GUAIFENESIN PO  Take 400 mg by mouth 3 (three) times daily as needed. Congestion     levothyroxine 50 MCG tablet  Commonly known as:  SYNTHROID, LEVOTHROID  TAKE 1 TABLET BY MOUTH EVERY DAY     losartan 100 MG tablet  Commonly known as:  COZAAR  TAKE 1 TABLET BY MOUTH DAILY     magnesium gluconate 500 MG tablet  Commonly known as:  MAGONATE  Take 1,000 mg by mouth daily.     multivitamin with minerals tablet  Take 1 tablet by mouth daily.     nebivolol 2.5 MG tablet  Commonly known as:  BYSTOLIC  Take 1 tablet (2.5 mg total)  by mouth daily.     oxyCODONE-acetaminophen 5-325 MG per tablet  Commonly known as:  ROXICET  Take 1 tablet by mouth every 4 (four) hours as needed for severe pain.     tadalafil 5 MG tablet  Commonly known as:  CIALIS  Take 5 mg by mouth daily.     testosterone cypionate 200 MG/ML injection  Commonly known as:  DEPO-TESTOSTERONE  Inject 2 mLs (400 mg total) into the muscle every 21 ( twenty-one) days.     timolol 0.5 % ophthalmic solution  Commonly known as:  TIMOPTIC  Place 1 drop into both eyes.     Vitamin D3 10000 UNITS capsule  Take 5,000 Units by mouth See admin instructions. 3 times a week. No specific days           Follow-up Information    Follow up with [facility].   Why:  They will contact you to schedule home physical therapy and occupational therapy visits.    Contact  information:   [address] [place] [address] [phone]       Follow up with DUDA,MARCUS V, MD In 2 weeks.   Specialty:  Orthopedic Surgery   Contact information:   [place] [address] [phone]       Signed: [name] 10/22/2014, 6:20 AM

## 2014-10-23 LAB — ANAEROBIC CULTURE

## 2014-10-25 DIAGNOSIS — M71161 Other infective bursitis, right knee: Secondary | ICD-10-CM | POA: Diagnosis not present

## 2014-10-25 DIAGNOSIS — Z9181 History of falling: Secondary | ICD-10-CM | POA: Diagnosis not present

## 2014-10-25 DIAGNOSIS — G20 Parkinson's disease: Secondary | ICD-10-CM | POA: Diagnosis not present

## 2014-10-26 DIAGNOSIS — Z9181 History of falling: Secondary | ICD-10-CM | POA: Diagnosis not present

## 2014-10-26 DIAGNOSIS — M71161 Other infective bursitis, right knee: Secondary | ICD-10-CM | POA: Diagnosis not present

## 2014-10-26 DIAGNOSIS — G20 Parkinson's disease: Secondary | ICD-10-CM | POA: Diagnosis not present

## 2014-10-28 DIAGNOSIS — G20 Parkinson's disease: Secondary | ICD-10-CM | POA: Diagnosis not present

## 2014-10-28 DIAGNOSIS — M71161 Other infective bursitis, right knee: Secondary | ICD-10-CM | POA: Diagnosis not present

## 2014-10-28 DIAGNOSIS — Z9181 History of falling: Secondary | ICD-10-CM | POA: Diagnosis not present

## 2014-11-01 DIAGNOSIS — Z9181 History of falling: Secondary | ICD-10-CM | POA: Diagnosis not present

## 2014-11-01 DIAGNOSIS — G20 Parkinson's disease: Secondary | ICD-10-CM | POA: Diagnosis not present

## 2014-11-01 DIAGNOSIS — M71161 Other infective bursitis, right knee: Secondary | ICD-10-CM | POA: Diagnosis not present

## 2014-11-02 DIAGNOSIS — M71161 Other infective bursitis, right knee: Secondary | ICD-10-CM | POA: Diagnosis not present

## 2014-11-02 DIAGNOSIS — G20 Parkinson's disease: Secondary | ICD-10-CM | POA: Diagnosis not present

## 2014-11-02 DIAGNOSIS — Z9181 History of falling: Secondary | ICD-10-CM | POA: Diagnosis not present

## 2014-11-03 ENCOUNTER — Ambulatory Visit: Payer: Medicare Other | Admitting: Internal Medicine

## 2014-11-04 DIAGNOSIS — G20 Parkinson's disease: Secondary | ICD-10-CM | POA: Diagnosis not present

## 2014-11-04 DIAGNOSIS — Z9181 History of falling: Secondary | ICD-10-CM | POA: Diagnosis not present

## 2014-11-04 DIAGNOSIS — M71161 Other infective bursitis, right knee: Secondary | ICD-10-CM | POA: Diagnosis not present

## 2014-11-06 ENCOUNTER — Other Ambulatory Visit: Payer: Self-pay | Admitting: Internal Medicine

## 2014-11-08 DIAGNOSIS — G20 Parkinson's disease: Secondary | ICD-10-CM | POA: Diagnosis not present

## 2014-11-08 DIAGNOSIS — Z9181 History of falling: Secondary | ICD-10-CM | POA: Diagnosis not present

## 2014-11-08 DIAGNOSIS — M71161 Other infective bursitis, right knee: Secondary | ICD-10-CM | POA: Diagnosis not present

## 2014-11-10 DIAGNOSIS — Z9181 History of falling: Secondary | ICD-10-CM | POA: Diagnosis not present

## 2014-11-10 DIAGNOSIS — G20 Parkinson's disease: Secondary | ICD-10-CM | POA: Diagnosis not present

## 2014-11-10 DIAGNOSIS — M71161 Other infective bursitis, right knee: Secondary | ICD-10-CM | POA: Diagnosis not present

## 2014-11-11 DIAGNOSIS — Z9181 History of falling: Secondary | ICD-10-CM | POA: Diagnosis not present

## 2014-11-11 DIAGNOSIS — G20 Parkinson's disease: Secondary | ICD-10-CM | POA: Diagnosis not present

## 2014-11-11 DIAGNOSIS — M71161 Other infective bursitis, right knee: Secondary | ICD-10-CM | POA: Diagnosis not present

## 2014-11-18 ENCOUNTER — Encounter: Payer: Self-pay | Admitting: Neurology

## 2014-11-18 ENCOUNTER — Ambulatory Visit (INDEPENDENT_AMBULATORY_CARE_PROVIDER_SITE_OTHER): Payer: Medicare Other | Admitting: Neurology

## 2014-11-18 VITALS — BP 108/72 | HR 57 | Temp 97.8°F | Ht 69.5 in | Wt 195.0 lb

## 2014-11-18 DIAGNOSIS — R296 Repeated falls: Secondary | ICD-10-CM | POA: Diagnosis not present

## 2014-11-18 DIAGNOSIS — B951 Streptococcus, group B, as the cause of diseases classified elsewhere: Secondary | ICD-10-CM

## 2014-11-18 DIAGNOSIS — G20 Parkinson's disease: Secondary | ICD-10-CM

## 2014-11-18 DIAGNOSIS — Z9181 History of falling: Secondary | ICD-10-CM

## 2014-11-18 DIAGNOSIS — M7041 Prepatellar bursitis, right knee: Secondary | ICD-10-CM

## 2014-11-18 DIAGNOSIS — G20C Parkinsonism, unspecified: Secondary | ICD-10-CM

## 2014-11-18 DIAGNOSIS — R269 Unspecified abnormalities of gait and mobility: Secondary | ICD-10-CM

## 2014-11-18 NOTE — Patient Instructions (Addendum)
Please continue to use your walker at all times. You are at great fall risk otherwise! I do not think you are safe to drive.
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
## 2014-11-25 ENCOUNTER — Encounter: Payer: Self-pay | Admitting: Internal Medicine

## 2014-11-25 ENCOUNTER — Ambulatory Visit (INDEPENDENT_AMBULATORY_CARE_PROVIDER_SITE_OTHER): Payer: Medicare Other | Admitting: Internal Medicine

## 2014-11-25 VITALS — BP 120/76 | HR 60 | Temp 97.6°F | Wt 206.0 lb

## 2014-11-25 DIAGNOSIS — E291 Testicular hypofunction: Secondary | ICD-10-CM | POA: Diagnosis not present

## 2014-11-25 DIAGNOSIS — G20 Parkinson's disease: Secondary | ICD-10-CM

## 2014-11-25 DIAGNOSIS — J069 Acute upper respiratory infection, unspecified: Secondary | ICD-10-CM

## 2014-11-25 DIAGNOSIS — I10 Essential (primary) hypertension: Secondary | ICD-10-CM | POA: Diagnosis not present

## 2014-11-25 DIAGNOSIS — E349 Endocrine disorder, unspecified: Secondary | ICD-10-CM

## 2014-11-25 MED ORDER — HYDROCODONE-HOMATROPINE 5-1.5 MG/5ML PO SYRP: 5.0000 mL | ORAL_SOLUTION | Freq: Four times a day (QID) | ORAL | Status: DC | PRN

## 2014-11-25 MED ORDER — POLYETHYLENE GLYCOL 3350 17 GM/SCOOP PO POWD: 17.0000 g | Freq: Two times a day (BID) | ORAL | Status: DC | PRN

## 2014-11-25 MED ORDER — TESTOSTERONE CYPIONATE 200 MG/ML IM SOLN
400.0000 mg | Freq: Once | INTRAMUSCULAR | Status: AC
  Administered 2014-11-25: 400 mg via INTRAMUSCULAR

## 2014-11-25 NOTE — Progress Notes (Signed)
Pre visit review using our clinic review tool, if applicable. No additional management support is needed unless otherwise documented below in the visit note. 

## 2014-11-25 NOTE — Progress Notes (Signed)
Subjective:    Patient ID: [name], male    DOB: 07-23-1946, 69 y.o.   MRN: [id number]  HPI  69 year old patient who has essential hypertension and Parkinson's disease.  Complaints today include some mild cough, sore throat as well as sinus congestion.  There is been no fever, productive cough, wheezing or shortness of breath.  Past Medical History  Diagnosis Date  . HTN (hypertension)   . Glaucoma   . OSA (obstructive sleep apnea)   . Allergic rhinitis   . Parkinsonism 01/16/2013  . Hyperlipidemia   . Thyroid disease     HYPOTHYROID  . Hypogonadism male   . Pre-diabetes     History   Social History  . Marital Status: Married    Spouse Name: linda  . Number of Children: 2  . Years of Education: college   Occupational History  . branch Librarian, academic   .     Social History Main Topics  . Smoking status: Never Smoker   . Smokeless tobacco: Never Used  . Alcohol Use: 0.0 oz/week    0 Standard drinks or equivalent per week  . Drug Use: No  . Sexual Activity: Not on file   Other Topics Concern  . Not on file   Social History Narrative    Past Surgical History  Procedure Laterality Date  . Uvulopalatopharyngoplasty    . Lumbar disc surgery    . Cataract extraction    . I&d extremity Right 10/18/2014    Procedure: IRRIGATION AND DEBRIDEMENT RIGHT KNEE PREPATELLA BURSA INFECTION;  Surgeon: [name], MD;  Location: [facility];  Service: Orthopedics;  Laterality: Right;    Family History  Problem Relation Age of Onset  . Emphysema Mother   . COPD Mother   . Heart disease Mother   . Liver cancer Father   . Cancer Father   . Diabetes Father   . Heart disease Sister   . COPD Sister   . Heart attack Sister   . Hypertension Mother   . Hypertension Father   . Hypertension Sister   . Stroke Neg Hx     Allergies  Allergen Reactions  . Ambien [Zolpidem Tartrate]     unknown  . Atenolol     unknown  . Lunesta [Eszopiclone]     unknown  . Oxazepam      unknown  . Requip [Ropinirole Hcl]     unknown  . Ziac [Bisoprolol-Hydrochlorothiazide]     unknown    Current Outpatient Prescriptions on File Prior to Visit  Medication Sig Dispense Refill  . amitriptyline (ELAVIL) 25 MG tablet TAKE 1 TABLET BY MOUTH AT BEDTIME 90 tablet 1  . aspirin 81 MG tablet Take 81 mg by mouth daily.      . Cholecalciferol (VITAMIN D3) 10000 UNITS capsule Take 5,000 Units by mouth daily. Mon. ,OVFIEP,32951 weekly    . Docusate Calcium (STOOL SOFTENER PO) Take by mouth as needed.    . fish oil-omega-3 fatty acids 1000 MG capsule Take 1,200 mg by mouth daily. 2400 mg on Tuesday, Thursday, and saturday    . fluticasone (FLONASE) 50 MCG/ACT nasal spray USE 1-2 SPRAYS IN EACH NOSTRIL EVERY DAY 16 g 6  . GUAIFENESIN PO Take 400 mg by mouth 3 (three) times daily as needed. Congestion    . levothyroxine (SYNTHROID, LEVOTHROID) 50 MCG tablet TAKE 1 TABLET BY MOUTH EVERY DAY 30 tablet 5  . losartan (COZAAR) 100 MG tablet TAKE 1 TABLET BY MOUTH DAILY  30 tablet 5  . magnesium gluconate (MAGONATE) 500 MG tablet Take 1,000 mg by mouth daily.     . Multiple Vitamins-Minerals (MULTIVITAMIN WITH MINERALS) tablet Take 1 tablet by mouth daily.      . Naproxen Sodium (ALEVE PO) Take 2 tablets by mouth daily as needed. For pain    . nebivolol (BYSTOLIC) 2.5 MG tablet Take 1 tablet (2.5 mg total) by mouth daily. 30 tablet 6  . testosterone cypionate (DEPO-TESTOSTERONE) 200 MG/ML injection Inject 2 mLs (400 mg total) into the muscle every 21 ( twenty-one) days. 10 mL 5  . timolol (TIMOPTIC) 0.5 % ophthalmic solution Place 1 drop into both eyes.   12   No current facility-administered medications on file prior to visit.    BP 120/76 mmHg  Pulse 60  Temp(Src) 97.6 F (36.4 C) (Oral)  Wt 206 lb (93.441 kg)  SpO2 98%     Review of Systems  Constitutional: Positive for activity change, appetite change and fatigue. Negative for fever and chills.  HENT: Positive for  congestion, postnasal drip, sinus pressure and sore throat. Negative for dental problem, ear pain, hearing loss, tinnitus, trouble swallowing and voice change.   Eyes: Negative for pain, discharge and visual disturbance.  Respiratory: Positive for cough. Negative for chest tightness, wheezing and stridor.   Cardiovascular: Negative for chest pain, palpitations and leg swelling.  Gastrointestinal: Negative for nausea, vomiting, abdominal pain, diarrhea, constipation, blood in stool and abdominal distention.  Genitourinary: Negative for urgency, hematuria, flank pain, discharge, difficulty urinating and genital sores.  Musculoskeletal: Positive for gait problem. Negative for myalgias, back pain, joint swelling, arthralgias and neck stiffness.  Skin: Negative for rash.  Neurological: Negative for dizziness, syncope, speech difficulty, weakness, numbness and headaches.  Hematological: Negative for adenopathy. Does not bruise/bleed easily.  Psychiatric/Behavioral: Negative for behavioral problems and dysphoric mood. The patient is not nervous/anxious.        Objective:   Physical Exam  Constitutional: He is oriented to person, place, and time. He appears well-developed.  HENT:  Head: Normocephalic.  Right Ear: External ear normal.  Left Ear: External ear normal.  Very mild erythema of the oropharynx  Eyes: Conjunctivae and EOM are normal.  Neck: Normal range of motion.  Cardiovascular: Normal rate and normal heart sounds.   Pulmonary/Chest: Breath sounds normal.  Abdominal: Bowel sounds are normal.  Musculoskeletal: Normal range of motion. He exhibits no edema or tenderness.  Neurological: He is alert and oriented to person, place, and time.  Psychiatric: He has a normal mood and affect. His behavior is normal.          Assessment & Plan:   Viral URI with cough.  Will treat symptomatically Parkinson's disease.  Hypertension, controlled

## 2014-11-25 NOTE — Patient Instructions (Addendum)
Acute bronchitis symptoms  are generally not helped by antibiotics.  Take over-the-counter expectorants and cough medications such as  Mucinex DM.  Call if there is no improvement in 5 to 7 days or if  you develop worsening cough, fever, or new symptoms, such as shortness of breath or chest pain.  TREATMENT  Acute bronchitis usually goes away in a couple weeks. Oftentimes, no medical treatment is necessary. Medicines are sometimes given for relief of fever or cough. Antibiotic medicines are usually not needed but may be prescribed in certain situations. In some cases, an inhaler may be recommended to help reduce shortness of breath and control the cough. A cool mist vaporizer may also be used to help thin bronchial secretions and make it easier to clear the chest.  HOME CARE INSTRUCTIONS  Get plenty of rest.  Drink enough fluids to keep your urine clear or pale yellow (unless you have a medical condition that requires fluid restriction). Increasing fluids may help thin your respiratory secretions (sputum) and reduce chest congestion, and it will prevent dehydration.  Take medicines only as directed by your health care provider.  If you were prescribed an antibiotic medicine, finish it all even if you start to feel better.  Avoid smoking and secondhand smoke. Exposure to cigarette smoke or irritating chemicals will make bronchitis worse. If you are a smoker, consider using nicotine gum or skin patches to help control withdrawal symptoms. Quitting smoking will help your lungs heal faster.  Reduce the chances of another bout of acute bronchitis by washing your hands frequently, avoiding people with cold symptoms, and trying not to touch your hands to your mouth, nose, or eyes.  Keep all follow-up visits as directed by your health care provider.  SEEK MEDICAL CARE IF:  Your symptoms do not improve after 1 week of treatment.  SEEK IMMEDIATE MEDICAL CARE IF:  You develop an increased fever or chills.  You  have chest pain.  You have severe shortness of breath.  You have bloody sputum.  You develop dehydration.  You faint or repeatedly feel like you are going to pass out.  You develop repeated vomiting.  You develop a severe headache. Constipation Constipation is when a person:  Poops (has a bowel movement) less than 3 times a week.  Has a hard time pooping.  Has poop that is dry, hard, or bigger than normal. HOME CARE   Eat foods with a lot of fiber in them. This includes fruits, vegetables, beans, and whole grains such as brown rice.  Avoid fatty foods and foods with a lot of sugar. This includes french fries, hamburgers, cookies, candy, and soda.  If you are not getting enough fiber from food, take products with added fiber in them (supplements).  Drink enough fluid to keep your pee (urine) clear or pale yellow.  Exercise on a regular basis, or as told by your doctor.  Go to the restroom when you feel like you need to poop. Do not hold it.  Only take medicine as told by your doctor. Do not take medicines that help you poop (laxatives) without talking to your doctor first. GET HELP RIGHT AWAY IF:   You have bright red blood in your poop (stool).  Your constipation lasts more than 4 days or gets worse.  You have belly (abdominal) or butt (rectal) pain.  You have thin poop (as thin as a pencil).  You lose weight, and it cannot be explained. MAKE SURE YOU:   Understand these  instructions.  Will watch your condition.  Will get help right away if you are not doing well or get worse. Document Released: 03/12/2008 Document Revised: 09/29/2013 Document Reviewed: 07/06/2013 [facility] Patient Information 2015 [place], [place]. This information is not intended to replace advice given to you by your health care provider. Make sure you discuss any questions you have with your health care provider.

## 2014-11-29 ENCOUNTER — Telehealth: Payer: Self-pay | Admitting: Internal Medicine

## 2014-11-29 DIAGNOSIS — R05 Cough: Secondary | ICD-10-CM

## 2014-11-29 DIAGNOSIS — R0989 Other specified symptoms and signs involving the circulatory and respiratory systems: Secondary | ICD-10-CM

## 2014-11-29 DIAGNOSIS — R059 Cough, unspecified: Secondary | ICD-10-CM

## 2014-11-29 NOTE — Telephone Encounter (Signed)
[name] [name] would like you to call him please.

## 2014-11-29 NOTE — Telephone Encounter (Signed)
Acute bronchitis symptoms for less than 10 days are generally not helped by antibiotics.  Take over-the-counter expectorants and cough medications such as  Mucinex DM.    Please schedule a chest x-ray tomorrow if patient unimproved to rule out pneumonia

## 2014-11-29 NOTE — Telephone Encounter (Signed)
Please see message and advise 

## 2014-11-29 NOTE — Telephone Encounter (Signed)
Spoke to pt he is requesting a Z-Pak for sore throat, sinus infection, cough for weeks. Told him will check with Dr. Raliegh Ip and get back to him. Pt verbalized understanding.

## 2014-11-30 ENCOUNTER — Ambulatory Visit (INDEPENDENT_AMBULATORY_CARE_PROVIDER_SITE_OTHER)
Admission: RE | Admit: 2014-11-30 | Discharge: 2014-11-30 | Disposition: A | Discharge status: Home / Self Care | Payer: Medicare Other | Source: Ambulatory Visit | Attending: Internal Medicine | Admitting: Internal Medicine

## 2014-11-30 DIAGNOSIS — R05 Cough: Secondary | ICD-10-CM

## 2014-11-30 DIAGNOSIS — R059 Cough, unspecified: Secondary | ICD-10-CM

## 2014-11-30 DIAGNOSIS — R0989 Other specified symptoms and signs involving the circulatory and respiratory systems: Secondary | ICD-10-CM

## 2014-11-30 LAB — AFB CULTURE WITH SMEAR (NOT AT ARMC): Acid Fast Smear: NONE SEEN

## 2014-11-30 NOTE — Telephone Encounter (Signed)
Spoke to pt told him acute bronchitis symptoms less than 10 days are not generally helped by antibiotics need to take over the counter expectorants and cough medications such as Mucinex DM. Pt said he has been taking that and still no better. Told pt okay then Dr. Raliegh Ip would like you to have a Chest x-ray, you can go to [facility], gave pt address and told him will call with results later. Pt verbalized understanding.

## 2014-11-30 NOTE — Telephone Encounter (Signed)
Left message on voicemail to call office.  

## 2014-11-30 NOTE — Telephone Encounter (Signed)
Pt returning your call

## 2014-12-01 ENCOUNTER — Other Ambulatory Visit: Payer: Self-pay | Admitting: Internal Medicine

## 2014-12-12 DIAGNOSIS — I421 Obstructive hypertrophic cardiomyopathy: Secondary | ICD-10-CM | POA: Insufficient documentation

## 2014-12-12 NOTE — Progress Notes (Signed)
Cardiology Office Note   Date:  12/13/2014   ID:  [name], DOB [birth year], MRN [id number]  PCP:  Nyoka Cowden, MD  Cardiologist:   Sueanne Margarita, MD   Chief Complaint  Patient presents with  . Shortness of Breath  . Hypertension  . Cardiomyopathy      History of Present Illness: [name] is a 69 y.o. male with a history of HTN, OSA, Parkinson's, hyperlipidemia, pre- diabetes and hypothyroidism who presents today for followup of HTN, fatigue and SOB.When I last saw him he was complaining that SOB has been occurring for about a year and had gotten worse recently. He noticed it when he would bend over to tie his shoes. He has noticed fatigue when he exerted himself but denied any chest pain. He has had some LE edema over the past year as well. He denied any palpitations but would get dizzy when standing for long periods of time. EKG at that time showed NSR with old inferior infarct.  Nuclear stress test showed no ischemia and echo showed normal LVF with asymmetric septal hypertrophy c/w HOCM with no outflow tact gradient.  He was started on Bystolic.  He now presents for followup.  He denies any chest pain.  He does not have SOB when walking in the house but the SOB when bending over has not changed.  He still gets fatigued with exertion.     Past Medical History  Diagnosis Date  . HTN (hypertension)   . Glaucoma   . OSA (obstructive sleep apnea)   . Allergic rhinitis   . Parkinsonism 01/16/2013  . Hyperlipidemia   . Thyroid disease     HYPOTHYROID  . Hypogonadism male   . Pre-diabetes     Past Surgical History  Procedure Laterality Date  . Uvulopalatopharyngoplasty    . Lumbar disc surgery    . Cataract extraction    . I&d extremity Right 10/18/2014    Procedure: IRRIGATION AND DEBRIDEMENT RIGHT KNEE PREPATELLA BURSA INFECTION;  Surgeon: [name], MD;  Location: [facility];  Service: Orthopedics;  Laterality: Right;     Current Outpatient  Prescriptions  Medication Sig Dispense Refill  . amitriptyline (ELAVIL) 25 MG tablet TAKE 1 TABLET BY MOUTH AT BEDTIME 90 tablet 1  . aspirin 81 MG tablet Take 81 mg by mouth daily.      . Cholecalciferol (VITAMIN D3) 10000 UNITS capsule Take 5,000 Units by mouth daily. Mon. ,[id number] weekly    . Docusate Calcium (STOOL SOFTENER PO) Take by mouth as needed.    . fish oil-omega-3 fatty acids 1000 MG capsule Take 1,200 mg by mouth daily. 2400 mg on Tuesday, Thursday, and saturday    . fluticasone (FLONASE) 50 MCG/ACT nasal spray USE 1-2 SPRAYS IN EACH NOSTRIL EVERY DAY 16 g 6  . GUAIFENESIN PO Take 400 mg by mouth 3 (three) times daily as needed. Congestion    . HYDROcodone-homatropine (HYCODAN) 5-1.5 MG/5ML syrup Take 5 mLs by mouth every 6 (six) hours as needed for cough. 120 mL 0  . levothyroxine (SYNTHROID, LEVOTHROID) 50 MCG tablet TAKE 1 TABLET BY MOUTH EVERY DAY 30 tablet 5  . losartan (COZAAR) 100 MG tablet TAKE 1 TABLET BY MOUTH DAILY 30 tablet 5  . magnesium gluconate (MAGONATE) 500 MG tablet Take 1,000 mg by mouth daily.     . Multiple Vitamins-Minerals (MULTIVITAMIN WITH MINERALS) tablet Take 1 tablet by mouth daily.      . Naproxen Sodium (ALEVE PO) Take 2  tablets by mouth daily as needed. For pain    . nebivolol (BYSTOLIC) 2.5 MG tablet Take 1 tablet (2.5 mg total) by mouth daily. 30 tablet 6  . polyethylene glycol powder (GLYCOLAX/MIRALAX) powder Take 17 g by mouth 2 (two) times daily as needed. 3350 g 1  . testosterone cypionate (DEPO-TESTOSTERONE) 200 MG/ML injection Inject 2 mLs (400 mg total) into the muscle every 21 ( twenty-one) days. 10 mL 5  . timolol (TIMOPTIC) 0.5 % ophthalmic solution Place 1 drop into both eyes 2 (two) times daily.   12   No current facility-administered medications for this visit.    Allergies:   Ambien; Atenolol; Lunesta; Oxazepam; Requip; and Ziac    Social History:  The patient  reports that he has never smoked. He has never used smokeless  tobacco. He reports that he drinks alcohol. He reports that he does not use illicit drugs.   Family History:  The patient's family history includes COPD in his mother and sister; Cancer in his father; Diabetes in his father; Emphysema in his mother; Heart attack in his sister; Heart disease in his mother and sister; Hypertension in his father, mother, and sister; Liver cancer in his father. There is no history of Stroke.    ROS:  Please see the history of present illness.   Otherwise, review of systems are positive for none.   All other systems are reviewed and negative.    PHYSICAL EXAM: VS:  BP 116/72 mmHg  Pulse 61  Ht 5' 9.5" (1.765 m)  Wt 209 lb (94.802 kg)  BMI 30.43 kg/m2 , BMI Body mass index is 30.43 kg/(m^2). GEN: Well nourished, well developed, in no acute distress HEENT: normal Neck: no JVD, carotid bruits, or masses Cardiac: RRR; no murmurs, rubs, or gallops,no edema  Respiratory:  clear to auscultation bilaterally, normal work of breathing GI: soft, nontender, nondistended, + BS MS: no deformity or atrophy Skin: warm and dry, no rash Neuro:  Strength and sensation are intact Psych: euthymic mood, full affect   EKG:  EKG is not ordered today.    Recent Labs: 05/13/2014: TSH 1.66 09/28/2014: ALT 30 10/18/2014: BUN 14; Creatinine 0.98; Hemoglobin 14.5; Platelets 160; Potassium 3.5; Sodium 136    Lipid Panel    Component Value Date/Time   CHOL 186 09/28/2014 1015   TRIG 143.0 09/28/2014 1015   HDL 33.30* 09/28/2014 1015   CHOLHDL 6 09/28/2014 1015   VLDL 28.6 09/28/2014 1015   LDLCALC 124* 09/28/2014 1015      Wt Readings from Last 3 Encounters:  12/13/14 209 lb (94.802 kg)  11/25/14 206 lb (93.441 kg)  11/18/14 195 lb (88.451 kg)         ASSESSMENT AND PLAN:  1. Hypertrophic cardiomyopathy: No LVOT obstruction by echocardiogram. Continue with low dose beta blocker.  There is no real room in his HR and BP to increase his medications further.  2.  Dyspnea: Etiology not entirely clear. Some of this may be related to his hypertrophic cardiopathy. Recent Myoview was normal. He denies a history of smoking. Question of some of his symptoms may be related to Parkinson's. I am going to check a BNP today 3. Hypertension: Controlled.  Continue Losartan and Bystolic  Current medicines are reviewed at length with the patient today.  The patient does not have concerns regarding medicines.  The following changes have been made:  no change  Labs/ tests ordered today include: BNP    Disposition:   FU with me in  6 months   Signed, Sueanne Margarita, MD  12/13/2014 8:32 AM    [facility] [place], [place], [place]  [address] Phone: [phone]; Fax: [phone]

## 2014-12-13 ENCOUNTER — Ambulatory Visit (INDEPENDENT_AMBULATORY_CARE_PROVIDER_SITE_OTHER): Payer: Medicare Other | Admitting: Cardiology

## 2014-12-13 ENCOUNTER — Encounter: Payer: Self-pay | Admitting: Cardiology

## 2014-12-13 VITALS — BP 116/72 | HR 61 | Ht 69.5 in | Wt 209.0 lb

## 2014-12-13 DIAGNOSIS — R0609 Other forms of dyspnea: Secondary | ICD-10-CM | POA: Diagnosis not present

## 2014-12-13 DIAGNOSIS — I421 Obstructive hypertrophic cardiomyopathy: Secondary | ICD-10-CM

## 2014-12-13 DIAGNOSIS — M5416 Radiculopathy, lumbar region: Secondary | ICD-10-CM | POA: Diagnosis not present

## 2014-12-13 DIAGNOSIS — I10 Essential (primary) hypertension: Secondary | ICD-10-CM

## 2014-12-13 DIAGNOSIS — M545 Low back pain: Secondary | ICD-10-CM | POA: Diagnosis not present

## 2014-12-13 LAB — BRAIN NATRIURETIC PEPTIDE: Pro B Natriuretic peptide (BNP): 8 pg/mL (ref 0.0–100.0)

## 2014-12-13 NOTE — Patient Instructions (Signed)
Your physician recommends that you have lab work TODAY (BNP)  Your physician wants you to follow-up in: 6 months with Dr. [name]. You will receive a reminder letter in the mail two months in advance. If you don't receive a letter, please call our office to schedule the follow-up appointment.

## 2014-12-16 ENCOUNTER — Ambulatory Visit: Payer: BLUE CROSS/BLUE SHIELD | Admitting: *Deleted

## 2014-12-16 DIAGNOSIS — H4011X3 Primary open-angle glaucoma, severe stage: Secondary | ICD-10-CM | POA: Diagnosis not present

## 2014-12-16 DIAGNOSIS — G20 Parkinson's disease: Secondary | ICD-10-CM | POA: Diagnosis not present

## 2014-12-16 DIAGNOSIS — Z961 Presence of intraocular lens: Secondary | ICD-10-CM | POA: Diagnosis not present

## 2014-12-24 ENCOUNTER — Ambulatory Visit (INDEPENDENT_AMBULATORY_CARE_PROVIDER_SITE_OTHER): Payer: Medicare Other | Admitting: Internal Medicine

## 2014-12-24 ENCOUNTER — Encounter: Payer: Self-pay | Admitting: Internal Medicine

## 2014-12-24 DIAGNOSIS — G20 Parkinson's disease: Secondary | ICD-10-CM

## 2014-12-24 DIAGNOSIS — E291 Testicular hypofunction: Secondary | ICD-10-CM | POA: Diagnosis not present

## 2014-12-24 DIAGNOSIS — I10 Essential (primary) hypertension: Secondary | ICD-10-CM

## 2014-12-24 DIAGNOSIS — N529 Male erectile dysfunction, unspecified: Secondary | ICD-10-CM

## 2014-12-24 DIAGNOSIS — N528 Other male erectile dysfunction: Secondary | ICD-10-CM

## 2014-12-24 DIAGNOSIS — E349 Endocrine disorder, unspecified: Secondary | ICD-10-CM

## 2014-12-24 MED ORDER — TESTOSTERONE CYPIONATE 200 MG/ML IM SOLN
400.0000 mg | Freq: Once | INTRAMUSCULAR | Status: AC
  Administered 2014-12-24: 400 mg via INTRAMUSCULAR

## 2014-12-24 NOTE — Progress Notes (Signed)
Subjective:    Patient ID: [name], male    DOB: [birth year], 69 y.o.   MRN: [id number]  HPI  69 year old patient who has a history of PD.  Hypertension, dyslipidemia, and testosterone deficiency.  Remains frustrated about his parkinsonism, but otherwise doing fairly well.  Has been seen by cardiology recently.  No complaints of dyspnea today.  Complaints today are fatigue and gait instability resulting in falls.  No DO E.  Past Medical History  Diagnosis Date  . HTN (hypertension)   . Glaucoma   . OSA (obstructive sleep apnea)   . Allergic rhinitis   . Parkinsonism 01/16/2013  . Hyperlipidemia   . Thyroid disease     HYPOTHYROID  . Hypogonadism male   . Pre-diabetes     History   Social History  . Marital Status: Married    Spouse Name: linda  . Number of Children: 2  . Years of Education: college   Occupational History  . branch Librarian, academic   .     Social History Main Topics  . Smoking status: Never Smoker   . Smokeless tobacco: Never Used  . Alcohol Use: 0.0 oz/week    0 Standard drinks or equivalent per week  . Drug Use: No  . Sexual Activity: Not on file   Other Topics Concern  . Not on file   Social History Narrative    Past Surgical History  Procedure Laterality Date  . Uvulopalatopharyngoplasty    . Lumbar disc surgery    . Cataract extraction    . I&d extremity Right 10/18/2014    Procedure: IRRIGATION AND DEBRIDEMENT RIGHT KNEE PREPATELLA BURSA INFECTION;  Surgeon: [name], MD;  Location: [facility];  Service: Orthopedics;  Laterality: Right;    Family History  Problem Relation Age of Onset  . Emphysema Mother   . COPD Mother   . Heart disease Mother   . Liver cancer Father   . Cancer Father   . Diabetes Father   . Heart disease Sister   . COPD Sister   . Heart attack Sister   . Hypertension Mother   . Hypertension Father   . Hypertension Sister   . Stroke Neg Hx     Allergies  Allergen Reactions  . Ambien [Zolpidem  Tartrate]     unknown  . Atenolol     unknown  . Lunesta [Eszopiclone]     unknown  . Oxazepam     unknown  . Requip [Ropinirole Hcl]     unknown  . Ziac [Bisoprolol-Hydrochlorothiazide]     unknown    Current Outpatient Prescriptions on File Prior to Visit  Medication Sig Dispense Refill  . amitriptyline (ELAVIL) 25 MG tablet TAKE 1 TABLET BY MOUTH AT BEDTIME 90 tablet 1  . aspirin 81 MG tablet Take 81 mg by mouth daily.      . Cholecalciferol (VITAMIN D3) 10000 UNITS capsule Take 5,000 Units by mouth daily. Mon. ,[id number] weekly    . Docusate Calcium (STOOL SOFTENER PO) Take by mouth as needed.    . fish oil-omega-3 fatty acids 1000 MG capsule Take 1,200 mg by mouth daily. 2400 mg on Tuesday, Thursday, and saturday    . fluticasone (FLONASE) 50 MCG/ACT nasal spray USE 1-2 SPRAYS IN EACH NOSTRIL EVERY DAY 16 g 6  . GUAIFENESIN PO Take 400 mg by mouth 3 (three) times daily as needed. Congestion    . levothyroxine (SYNTHROID, LEVOTHROID) 50 MCG tablet TAKE 1 TABLET BY MOUTH EVERY  DAY 30 tablet 5  . losartan (COZAAR) 100 MG tablet TAKE 1 TABLET BY MOUTH DAILY 30 tablet 5  . magnesium gluconate (MAGONATE) 500 MG tablet Take 1,000 mg by mouth daily.     . Multiple Vitamins-Minerals (MULTIVITAMIN WITH MINERALS) tablet Take 1 tablet by mouth daily.      . Naproxen Sodium (ALEVE PO) Take 2 tablets by mouth daily as needed. For pain    . nebivolol (BYSTOLIC) 2.5 MG tablet Take 1 tablet (2.5 mg total) by mouth daily. 30 tablet 6  . polyethylene glycol powder (GLYCOLAX/MIRALAX) powder Take 17 g by mouth 2 (two) times daily as needed. 3350 g 1  . testosterone cypionate (DEPO-TESTOSTERONE) 200 MG/ML injection Inject 2 mLs (400 mg total) into the muscle every 21 ( twenty-one) days. 10 mL 5  . timolol (TIMOPTIC) 0.5 % ophthalmic solution Place 1 drop into both eyes 2 (two) times daily.   12   No current facility-administered medications on file prior to visit.    BP 146/80 mmHg  Pulse 57   Temp(Src) 98 F (36.7 C) (Oral)  Resp 20  Ht 5' 9.5" (1.765 m)  Wt 206 lb (93.441 kg)  BMI 29.99 kg/m2  SpO2 96%     Review of Systems  Constitutional: Positive for fatigue. Negative for fever, chills and appetite change.  HENT: Negative for congestion, dental problem, ear pain, hearing loss, sore throat, tinnitus, trouble swallowing and voice change.   Eyes: Negative for pain, discharge and visual disturbance.  Respiratory: Negative for cough, chest tightness, wheezing and stridor.   Cardiovascular: Negative for chest pain, palpitations and leg swelling.  Gastrointestinal: Negative for nausea, vomiting, abdominal pain, diarrhea, constipation, blood in stool and abdominal distention.  Genitourinary: Negative for urgency, hematuria, flank pain, discharge, difficulty urinating and genital sores.  Musculoskeletal: Positive for gait problem. Negative for myalgias, back pain, joint swelling, arthralgias and neck stiffness.  Skin: Negative for rash.  Neurological: Negative for dizziness, syncope, speech difficulty, weakness, numbness and headaches.  Hematological: Negative for adenopathy. Does not bruise/bleed easily.  Psychiatric/Behavioral: Negative for behavioral problems and dysphoric mood. The patient is not nervous/anxious.        Objective:   Physical Exam  Constitutional: He is oriented to person, place, and time. He appears well-developed.  Blood pressure 138/78  HENT:  Head: Normocephalic.  Right Ear: External ear normal.  Left Ear: External ear normal.  Eyes: Conjunctivae and EOM are normal.  Neck: Normal range of motion.  Cardiovascular: Normal rate and normal heart sounds.   No murmur noted  Pulmonary/Chest: Breath sounds normal.  Abdominal: Bowel sounds are normal.  Musculoskeletal: Normal range of motion. He exhibits no edema or tenderness.  Neurological: He is alert and oriented to person, place, and time.  Psychiatric: He has a normal mood and affect. His  behavior is normal.          Assessment & Plan:   Hypertension, well-controlled Parkinsonism.  Follow-up neurology Testosterone deficiency Dyslipidemia  Recheck 6 months for annual exam No change in medications

## 2014-12-24 NOTE — Patient Instructions (Signed)
Limit your sodium (Salt) intake  Return in 6 months for follow-up  

## 2014-12-24 NOTE — Progress Notes (Signed)
Pre visit review using our clinic review tool, if applicable. No additional management support is needed unless otherwise documented below in the visit note. 

## 2015-01-04 ENCOUNTER — Ambulatory Visit: Payer: Medicare Other | Admitting: Occupational Therapy

## 2015-01-04 ENCOUNTER — Ambulatory Visit: Payer: Medicare Other | Attending: Nurse Practitioner | Admitting: Physical Therapy

## 2015-01-04 DIAGNOSIS — R269 Unspecified abnormalities of gait and mobility: Secondary | ICD-10-CM

## 2015-01-04 DIAGNOSIS — R279 Unspecified lack of coordination: Secondary | ICD-10-CM

## 2015-01-04 DIAGNOSIS — R258 Other abnormal involuntary movements: Secondary | ICD-10-CM | POA: Insufficient documentation

## 2015-01-04 NOTE — Therapy (Signed)
[facility] [address] [place], [place], [address] Phone: [phone]   Fax:  [phone]  Patient Details  Name: [name] MRN: [id number] Date of Birth: 10/21/1945 Referring Provider:  [name], MD  Encounter Date: 01/04/2015 Physical Therapy Parkinson's Disease Screen   Timed Up and Go test:11.03 sec  10 meter walk test:3.46 ft/sec  5 time sit to stand test:11.77 sec   Patient does not require Physical Therapy services at this time.  Recommend Physical Therapy screen in 6-8 months.  Pt has recently finished with HHPT and has exercises.  He is using cane and his numbers are very similar to numbers at discharge in fall 2015.    [name] W. 01/04/2015, 1:40 PM Mady Haagensen, PT 01/04/2015 1:58 PM Phone: [phone] Fax: [facility] [address] [place], [place], [address] Phone: [phone]   Fax:  [phone]

## 2015-01-04 NOTE — Therapy (Addendum)
[facility] [address] [place], [place], [address] Phone: [phone]   Fax:  [phone]  Patient Details  Name: [name] MRN: [id number] Date of Birth: 05/20/1946 Referring Provider:  [name], MD  Encounter Date: 01/04/2015  Occupational Therapy Parkinson's Disease Screen  Physical Performance Test item #4 (donning/doffing jacket):  17.29sec  Box & Blocks Test:   RUE  47 blocks        LUE  56 blocks  Change in ability to perform ADLs/IADLs:  Little slower  Other Comments:  Last fall approx 2 weeks ago.  Pt reports ADL performance "about the same"  Pt given info for Driving evaluation as pt reports that he continues to drive (despite MD recommendation against driving).  Encouraged pt to pursue driving evaluation due to safety concerns.  Also, recommended that pt use RW, but he reports that he does not like it and came in with cane today.  Pt does not require occupation therapy services at this time.  Recommended occupational therapy screen in  approx 6 months.     [facility] 01/04/2015, 1:21 PM  [facility] [address] [place], [place], [address] Phone: [phone]   Fax:  [place], OTR/L 01/04/2015 1:21 PM

## 2015-01-14 ENCOUNTER — Ambulatory Visit (INDEPENDENT_AMBULATORY_CARE_PROVIDER_SITE_OTHER): Payer: Medicare Other | Admitting: *Deleted

## 2015-01-14 DIAGNOSIS — E349 Endocrine disorder, unspecified: Secondary | ICD-10-CM

## 2015-01-14 DIAGNOSIS — E291 Testicular hypofunction: Secondary | ICD-10-CM

## 2015-01-14 DIAGNOSIS — N528 Other male erectile dysfunction: Secondary | ICD-10-CM

## 2015-01-14 DIAGNOSIS — N529 Male erectile dysfunction, unspecified: Secondary | ICD-10-CM

## 2015-01-14 MED ORDER — TESTOSTERONE CYPIONATE 200 MG/ML IM SOLN
400.0000 mg | Freq: Once | INTRAMUSCULAR | Status: AC
  Administered 2015-01-14: 400 mg via INTRAMUSCULAR

## 2015-01-17 DIAGNOSIS — M25561 Pain in right knee: Secondary | ICD-10-CM | POA: Diagnosis not present

## 2015-01-26 DIAGNOSIS — Z6829 Body mass index (BMI) 29.0-29.9, adult: Secondary | ICD-10-CM | POA: Diagnosis not present

## 2015-01-26 DIAGNOSIS — M545 Low back pain: Secondary | ICD-10-CM | POA: Diagnosis not present

## 2015-01-26 DIAGNOSIS — R269 Unspecified abnormalities of gait and mobility: Secondary | ICD-10-CM | POA: Diagnosis not present

## 2015-01-26 DIAGNOSIS — M5416 Radiculopathy, lumbar region: Secondary | ICD-10-CM | POA: Diagnosis not present

## 2015-01-26 DIAGNOSIS — M47817 Spondylosis without myelopathy or radiculopathy, lumbosacral region: Secondary | ICD-10-CM | POA: Diagnosis not present

## 2015-01-26 DIAGNOSIS — M5489 Other dorsalgia: Secondary | ICD-10-CM | POA: Diagnosis not present

## 2015-01-26 DIAGNOSIS — M542 Cervicalgia: Secondary | ICD-10-CM | POA: Diagnosis not present

## 2015-02-04 ENCOUNTER — Ambulatory Visit (INDEPENDENT_AMBULATORY_CARE_PROVIDER_SITE_OTHER): Payer: Medicare Other | Admitting: *Deleted

## 2015-02-04 DIAGNOSIS — E349 Endocrine disorder, unspecified: Secondary | ICD-10-CM

## 2015-02-04 DIAGNOSIS — E291 Testicular hypofunction: Secondary | ICD-10-CM | POA: Diagnosis not present

## 2015-02-04 MED ORDER — TESTOSTERONE CYPIONATE 200 MG/ML IM SOLN
400.0000 mg | Freq: Once | INTRAMUSCULAR | Status: AC
  Administered 2015-02-04: 400 mg via INTRAMUSCULAR

## 2015-02-07 ENCOUNTER — Telehealth: Payer: Self-pay

## 2015-02-07 MED ORDER — TESTOSTERONE CYPIONATE 200 MG/ML IM SOLN: 400.0000 mg | INTRAMUSCULAR | Status: DC

## 2015-02-07 NOTE — Telephone Encounter (Signed)
[facility] request for testosterone cypionate (DEPO-TESTOSTERONE) 200 MG/ML injection

## 2015-02-08 ENCOUNTER — Ambulatory Visit (INDEPENDENT_AMBULATORY_CARE_PROVIDER_SITE_OTHER): Payer: Medicare Other | Admitting: Adult Health

## 2015-02-08 ENCOUNTER — Encounter: Payer: Self-pay | Admitting: Adult Health

## 2015-02-08 VITALS — BP 104/68 | HR 66 | Temp 98.0°F | Wt 206.4 lb

## 2015-02-08 DIAGNOSIS — J209 Acute bronchitis, unspecified: Secondary | ICD-10-CM | POA: Diagnosis not present

## 2015-02-08 DIAGNOSIS — J069 Acute upper respiratory infection, unspecified: Secondary | ICD-10-CM

## 2015-02-08 MED ORDER — IPRATROPIUM-ALBUTEROL 0.5-2.5 (3) MG/3ML IN SOLN
3.0000 mL | Freq: Once | RESPIRATORY_TRACT | Status: AC
  Administered 2015-02-08: 3 mL via RESPIRATORY_TRACT

## 2015-02-08 MED ORDER — METHYLPREDNISOLONE ACETATE 80 MG/ML IJ SUSP
80.0000 mg | Freq: Once | INTRAMUSCULAR | Status: AC
  Administered 2015-02-08: 80 mg via INTRAMUSCULAR

## 2015-02-08 MED ORDER — BENZONATATE 100 MG PO CAPS: 100.0000 mg | ORAL_CAPSULE | Freq: Two times a day (BID) | ORAL | Status: DC | PRN

## 2015-02-08 NOTE — Patient Instructions (Addendum)
It was great meeting you today. It appears that your sinus and breathing problems are related to the allergies and or a virus. Antibiotics would not help you at this point. If you are not feeling any better in the next 3-4 days please follow up. If your symptoms get worse, please follow up sooner.   Continue to use your Flonase and Claritin. I have sent a prescription to the pharmacy for your cough. That medication is called Tessalon Pearls. You can use it every 12 hours as needed for the cough.   Upper Respiratory Infection, Adult An upper respiratory infection (URI) is also sometimes known as the common cold. The upper respiratory tract includes the nose, sinuses, throat, trachea, and bronchi. Bronchi are the airways leading to the lungs. Most people improve within 1 week, but symptoms can last up to 2 weeks. A residual cough may last even longer.  CAUSES Many different viruses can infect the tissues lining the upper respiratory tract. The tissues become irritated and inflamed and often become very moist. Mucus production is also common. A cold is contagious. You can easily spread the virus to others by oral contact. This includes kissing, sharing a glass, coughing, or sneezing. Touching your mouth or nose and then touching a surface, which is then touched by another person, can also spread the virus. SYMPTOMS  Symptoms typically develop 1 to 3 days after you come in contact with a cold virus. Symptoms vary from person to person. They may include:  Runny nose.  Sneezing.  Nasal congestion.  Sinus irritation.  Sore throat.  Loss of voice (laryngitis).  Cough.  Fatigue.  Muscle aches.  Loss of appetite.  Headache.  Low-grade fever. DIAGNOSIS  You might diagnose your own cold based on familiar symptoms, since most people get a cold 2 to 3 times a year. Your caregiver can confirm this based on your exam. Most importantly, your caregiver can check that your symptoms are not due to  another disease such as strep throat, sinusitis, pneumonia, asthma, or epiglottitis. Blood tests, throat tests, and X-rays are not necessary to diagnose a common cold, but they may sometimes be helpful in excluding other more serious diseases. Your caregiver will decide if any further tests are required. RISKS AND COMPLICATIONS  You may be at risk for a more severe case of the common cold if you smoke cigarettes, have chronic heart disease (such as heart failure) or lung disease (such as asthma), or if you have a weakened immune system. The very young and very old are also at risk for more serious infections. Bacterial sinusitis, middle ear infections, and bacterial pneumonia can complicate the common cold. The common cold can worsen asthma and chronic obstructive pulmonary disease (COPD). Sometimes, these complications can require emergency medical care and may be life-threatening. PREVENTION  The best way to protect against getting a cold is to practice good hygiene. Avoid oral or hand contact with people with cold symptoms. Wash your hands often if contact occurs. There is no clear evidence that vitamin C, vitamin E, echinacea, or exercise reduces the chance of developing a cold. However, it is always recommended to get plenty of rest and practice good nutrition. TREATMENT  Treatment is directed at relieving symptoms. There is no cure. Antibiotics are not effective, because the infection is caused by a virus, not by bacteria. Treatment may include:  Increased fluid intake. Sports drinks offer valuable electrolytes, sugars, and fluids.  Breathing heated mist or steam (vaporizer or shower).  Eating  chicken soup or other clear broths, and maintaining good nutrition.  Getting plenty of rest.  Using gargles or lozenges for comfort.  Controlling fevers with ibuprofen or acetaminophen as directed by your caregiver.  Increasing usage of your inhaler if you have asthma. Zinc gel and zinc lozenges,  taken in the first 24 hours of the common cold, can shorten the duration and lessen the severity of symptoms. Pain medicines may help with fever, muscle aches, and throat pain. A variety of non-prescription medicines are available to treat congestion and runny nose. Your caregiver can make recommendations and may suggest nasal or lung inhalers for other symptoms.  HOME CARE INSTRUCTIONS   Only take over-the-counter or prescription medicines for pain, discomfort, or fever as directed by your caregiver.  Use a warm mist humidifier or inhale steam from a shower to increase air moisture. This may keep secretions moist and make it easier to breathe.  Drink enough water and fluids to keep your urine clear or pale yellow.  Rest as needed.  Return to work when your temperature has returned to normal or as your caregiver advises. You may need to stay home longer to avoid infecting others. You can also use a face mask and careful hand washing to prevent spread of the virus. SEEK MEDICAL CARE IF:   After the first few days, you feel you are getting worse rather than better.  You need your caregiver's advice about medicines to control symptoms.  You develop chills, worsening shortness of breath, or brown or red sputum. These may be signs of pneumonia.  You develop yellow or brown nasal discharge or pain in the face, especially when you bend forward. These may be signs of sinusitis.  You develop a fever, swollen neck glands, pain with swallowing, or white areas in the back of your throat. These may be signs of strep throat. SEEK IMMEDIATE MEDICAL CARE IF:   You have a fever.  You develop severe or persistent headache, ear pain, sinus pain, or chest pain.  You develop wheezing, a prolonged cough, cough up blood, or have a change in your usual mucus (if you have chronic lung disease).  You develop sore muscles or a stiff neck. Document Released: 03/20/2001 Document Revised: 12/17/2011 Document  Reviewed: 12/30/2013 [facility] Patient Information 2015 [place], [place]. This information is not intended to replace advice given to you by your health care provider. Make sure you discuss any questions you have with your health care provider.

## 2015-02-08 NOTE — Telephone Encounter (Signed)
Rx faxed to pharmacy  

## 2015-02-08 NOTE — Progress Notes (Signed)
   Subjective:    Patient ID: [name], male    DOB: 10-Oct-1945, 69 y.o.   MRN: [id number]  HPI  69 year old Caucasian male with history of parkinsonism. Presents to the office today with upper respiratory infection type symptoms. He has had a cough nonproductive, congestion, sneezing, runny nose, sinus pain, and pressure and face as well as ear itching all weekend. He also endorses shortness of breath without chest pain. In taking Claritin and Flonase without much relief.  Denies fever nausea vomiting or diarrhea.  Review of Systems  Constitutional: Negative for fever, chills, activity change and fatigue.  HENT: Positive for congestion, ear pain, postnasal drip, rhinorrhea, sinus pressure, sneezing and trouble swallowing. Negative for hearing loss and sore throat.   Eyes: Negative for pain and itching.  Respiratory: Positive for cough, shortness of breath and wheezing. Negative for choking and chest tightness.   Cardiovascular: Negative for chest pain, palpitations and leg swelling.  Musculoskeletal: Positive for gait problem.  Hematological: Negative for adenopathy.  All other systems reviewed and are negative.      Objective:   Physical Exam  Constitutional: He is oriented to person, place, and time. He appears well-developed and well-nourished. No distress.  HENT:  Head: Normocephalic and atraumatic.  Right Ear: External ear normal.  Left Ear: External ear normal.  Mouth/Throat: Oropharynx is clear and moist. No oropharyngeal exudate.  Eyes: Right eye exhibits no discharge. Left eye exhibits no discharge.  Cardiovascular: Normal rate, regular rhythm and normal heart sounds.  Exam reveals no gallop and no friction rub.   No murmur heard. Pulmonary/Chest: Effort normal. No respiratory distress. He has wheezes (expiratory in upper left, does not clear with cough.  Diminsihed air movement in bilateral lower. ). He has no rales. He exhibits no tenderness.  Musculoskeletal: Normal  range of motion.  Walks with single prong cane.   Lymphadenopathy:    He has cervical adenopathy.  Neurological: He is alert and oriented to person, place, and time.  Skin: Skin is warm and dry.  Has bandage on right knee from skin tear related to recent fall  Psychiatric: He has a normal mood and affect. His behavior is normal. Judgment and thought content normal.       Assessment & Plan:  1. Upper respiratory infection - Likley viral in nature.  - ipratropium-albuterol (DUONEB) 0.5-2.5 (3) MG/3ML nebulizer solution 3 mL; Take 3 mLs by nebulization once. - methylPREDNISolone acetate (DEPO-MEDROL) injection 80 mg; Inject 1 mL (80 mg total) into the muscle once. - benzonatate (TESSALON) 100 MG capsule; Take 1 capsule (100 mg total) by mouth 2 (two) times daily as needed for cough.  Dispense: 20 capsule; Refill: 0 - Follow up if not feeling any better in 3-4 days.  - Follow up sooner if symptoms worsen or fever greater than 101.

## 2015-02-14 DIAGNOSIS — M71061 Abscess of bursa, right knee: Secondary | ICD-10-CM | POA: Diagnosis not present

## 2015-02-24 DIAGNOSIS — R296 Repeated falls: Secondary | ICD-10-CM | POA: Diagnosis not present

## 2015-02-25 ENCOUNTER — Ambulatory Visit (INDEPENDENT_AMBULATORY_CARE_PROVIDER_SITE_OTHER): Payer: Medicare Other | Admitting: *Deleted

## 2015-02-25 DIAGNOSIS — E291 Testicular hypofunction: Secondary | ICD-10-CM | POA: Diagnosis not present

## 2015-02-25 DIAGNOSIS — E349 Endocrine disorder, unspecified: Secondary | ICD-10-CM

## 2015-02-25 MED ORDER — TESTOSTERONE CYPIONATE 200 MG/ML IM SOLN
400.0000 mg | Freq: Once | INTRAMUSCULAR | Status: AC
  Administered 2015-02-25: 400 mg via INTRAMUSCULAR

## 2015-03-01 DIAGNOSIS — R296 Repeated falls: Secondary | ICD-10-CM | POA: Diagnosis not present

## 2015-03-02 DIAGNOSIS — R296 Repeated falls: Secondary | ICD-10-CM | POA: Diagnosis not present

## 2015-03-04 DIAGNOSIS — R296 Repeated falls: Secondary | ICD-10-CM | POA: Diagnosis not present

## 2015-03-09 DIAGNOSIS — R296 Repeated falls: Secondary | ICD-10-CM | POA: Diagnosis not present

## 2015-03-11 DIAGNOSIS — R296 Repeated falls: Secondary | ICD-10-CM | POA: Diagnosis not present

## 2015-03-14 DIAGNOSIS — R296 Repeated falls: Secondary | ICD-10-CM | POA: Diagnosis not present

## 2015-03-16 ENCOUNTER — Encounter: Payer: Self-pay | Admitting: Cardiology

## 2015-03-17 DIAGNOSIS — M47817 Spondylosis without myelopathy or radiculopathy, lumbosacral region: Secondary | ICD-10-CM | POA: Diagnosis not present

## 2015-03-17 DIAGNOSIS — M5416 Radiculopathy, lumbar region: Secondary | ICD-10-CM | POA: Diagnosis not present

## 2015-03-17 DIAGNOSIS — M545 Low back pain: Secondary | ICD-10-CM | POA: Diagnosis not present

## 2015-03-18 ENCOUNTER — Ambulatory Visit (INDEPENDENT_AMBULATORY_CARE_PROVIDER_SITE_OTHER): Payer: Medicare Other | Admitting: *Deleted

## 2015-03-18 DIAGNOSIS — E291 Testicular hypofunction: Secondary | ICD-10-CM

## 2015-03-18 DIAGNOSIS — E349 Endocrine disorder, unspecified: Secondary | ICD-10-CM

## 2015-03-18 MED ORDER — TESTOSTERONE CYPIONATE 200 MG/ML IM SOLN
400.0000 mg | Freq: Once | INTRAMUSCULAR | Status: AC
  Administered 2015-03-18: 400 mg via INTRAMUSCULAR

## 2015-03-22 ENCOUNTER — Telehealth: Payer: Self-pay | Admitting: Neurology

## 2015-03-22 NOTE — Telephone Encounter (Signed)
I reviewed the evaluation notes from outpatient physical therapy and occupational therapy from 01/04/2015. It was recommended that he be reevaluated in 6-8 months. I would suggest he follow their recommendations. We can certainly talk about it again during his appointment in July. Please let him know.  Thanks

## 2015-03-22 NOTE — Telephone Encounter (Signed)
Please advise if you want him to have. Patient has a follow up with you July 2016.

## 2015-03-22 NOTE — Telephone Encounter (Signed)
Patient would like a prescription for physical therapy - occupational and speech - Please call him back at [phone].

## 2015-03-23 NOTE — Telephone Encounter (Signed)
Called patient and spoke with he will wait until he has apt with Dr. Rexene Schaefer to speak to her more about therapy. Patient will follow up with Dr. Rexene Schaefer in July.

## 2015-03-28 ENCOUNTER — Other Ambulatory Visit: Payer: Self-pay | Admitting: Cardiology

## 2015-03-28 DIAGNOSIS — M71061 Abscess of bursa, right knee: Secondary | ICD-10-CM | POA: Diagnosis not present

## 2015-03-30 ENCOUNTER — Ambulatory Visit: Payer: Medicare Other | Admitting: Internal Medicine

## 2015-04-08 ENCOUNTER — Ambulatory Visit: Payer: Medicare Other | Admitting: *Deleted

## 2015-04-08 HISTORY — PX: LUMBAR DISC SURGERY: SHX700

## 2015-04-12 ENCOUNTER — Ambulatory Visit: Payer: Medicare Other | Admitting: *Deleted

## 2015-04-14 DIAGNOSIS — M5416 Radiculopathy, lumbar region: Secondary | ICD-10-CM | POA: Diagnosis not present

## 2015-04-14 DIAGNOSIS — M47817 Spondylosis without myelopathy or radiculopathy, lumbosacral region: Secondary | ICD-10-CM | POA: Diagnosis not present

## 2015-04-14 DIAGNOSIS — M5489 Other dorsalgia: Secondary | ICD-10-CM | POA: Diagnosis not present

## 2015-04-14 DIAGNOSIS — I10 Essential (primary) hypertension: Secondary | ICD-10-CM | POA: Diagnosis not present

## 2015-04-14 DIAGNOSIS — M545 Low back pain: Secondary | ICD-10-CM | POA: Diagnosis not present

## 2015-04-14 DIAGNOSIS — Z6829 Body mass index (BMI) 29.0-29.9, adult: Secondary | ICD-10-CM | POA: Diagnosis not present

## 2015-04-19 ENCOUNTER — Ambulatory Visit: Payer: Medicare Other | Admitting: Neurology

## 2015-04-20 DIAGNOSIS — M47816 Spondylosis without myelopathy or radiculopathy, lumbar region: Secondary | ICD-10-CM | POA: Diagnosis not present

## 2015-04-20 DIAGNOSIS — M5126 Other intervertebral disc displacement, lumbar region: Secondary | ICD-10-CM | POA: Diagnosis not present

## 2015-04-20 DIAGNOSIS — M47817 Spondylosis without myelopathy or radiculopathy, lumbosacral region: Secondary | ICD-10-CM | POA: Diagnosis not present

## 2015-04-20 DIAGNOSIS — M9973 Connective tissue and disc stenosis of intervertebral foramina of lumbar region: Secondary | ICD-10-CM | POA: Diagnosis not present

## 2015-04-20 DIAGNOSIS — M5136 Other intervertebral disc degeneration, lumbar region: Secondary | ICD-10-CM | POA: Diagnosis not present

## 2015-04-28 ENCOUNTER — Encounter: Payer: Self-pay | Admitting: Neurology

## 2015-04-28 ENCOUNTER — Ambulatory Visit (INDEPENDENT_AMBULATORY_CARE_PROVIDER_SITE_OTHER): Payer: Medicare Other | Admitting: Neurology

## 2015-04-28 VITALS — BP 128/73 | HR 56 | Resp 16 | Ht 69.5 in | Wt 198.0 lb

## 2015-04-28 DIAGNOSIS — R269 Unspecified abnormalities of gait and mobility: Secondary | ICD-10-CM | POA: Diagnosis not present

## 2015-04-28 DIAGNOSIS — R1310 Dysphagia, unspecified: Secondary | ICD-10-CM

## 2015-04-28 DIAGNOSIS — G20C Parkinsonism, unspecified: Secondary | ICD-10-CM

## 2015-04-28 DIAGNOSIS — G20 Parkinson's disease: Secondary | ICD-10-CM

## 2015-04-28 DIAGNOSIS — F482 Pseudobulbar affect: Secondary | ICD-10-CM

## 2015-04-28 DIAGNOSIS — Z9181 History of falling: Secondary | ICD-10-CM

## 2015-04-28 DIAGNOSIS — R296 Repeated falls: Secondary | ICD-10-CM

## 2015-04-28 NOTE — Patient Instructions (Signed)
We will do another outpt speech therapy round. You have re-evaluations pending with outpatient PT and OT in October.  Use your walker at all times!
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
## 2015-05-03 ENCOUNTER — Other Ambulatory Visit: Payer: Self-pay | Admitting: Internal Medicine

## 2015-05-06 ENCOUNTER — Ambulatory Visit (INDEPENDENT_AMBULATORY_CARE_PROVIDER_SITE_OTHER): Payer: Medicare Other | Admitting: *Deleted

## 2015-05-06 DIAGNOSIS — E291 Testicular hypofunction: Secondary | ICD-10-CM

## 2015-05-06 DIAGNOSIS — N528 Other male erectile dysfunction: Secondary | ICD-10-CM | POA: Diagnosis not present

## 2015-05-06 DIAGNOSIS — E349 Endocrine disorder, unspecified: Secondary | ICD-10-CM

## 2015-05-06 DIAGNOSIS — N529 Male erectile dysfunction, unspecified: Secondary | ICD-10-CM

## 2015-05-06 MED ORDER — TESTOSTERONE CYPIONATE 200 MG/ML IM SOLN
400.0000 mg | INTRAMUSCULAR | Status: DC
  Administered 2015-05-06: 400 mg via INTRAMUSCULAR

## 2015-05-27 ENCOUNTER — Ambulatory Visit: Payer: BLUE CROSS/BLUE SHIELD | Admitting: *Deleted

## 2015-05-31 ENCOUNTER — Other Ambulatory Visit: Payer: Self-pay | Admitting: Internal Medicine

## 2015-06-01 ENCOUNTER — Other Ambulatory Visit: Payer: Self-pay | Admitting: Neurological Surgery

## 2015-06-02 DIAGNOSIS — M545 Low back pain: Secondary | ICD-10-CM | POA: Diagnosis not present

## 2015-06-02 DIAGNOSIS — I10 Essential (primary) hypertension: Secondary | ICD-10-CM | POA: Diagnosis not present

## 2015-06-02 DIAGNOSIS — Z6828 Body mass index (BMI) 28.0-28.9, adult: Secondary | ICD-10-CM | POA: Diagnosis not present

## 2015-06-03 ENCOUNTER — Encounter (HOSPITAL_COMMUNITY): Payer: Self-pay

## 2015-06-03 ENCOUNTER — Encounter (HOSPITAL_COMMUNITY)
Admission: RE | Admit: 2015-06-03 | Discharge: 2015-06-03 | Disposition: A | Discharge status: Home / Self Care | Payer: Medicare Other | Source: Ambulatory Visit | Attending: Neurological Surgery | Admitting: Neurological Surgery

## 2015-06-03 ENCOUNTER — Ambulatory Visit: Payer: BLUE CROSS/BLUE SHIELD | Admitting: *Deleted

## 2015-06-03 DIAGNOSIS — G4730 Sleep apnea, unspecified: Secondary | ICD-10-CM | POA: Diagnosis not present

## 2015-06-03 DIAGNOSIS — G20 Parkinson's disease: Secondary | ICD-10-CM | POA: Diagnosis not present

## 2015-06-03 DIAGNOSIS — E039 Hypothyroidism, unspecified: Secondary | ICD-10-CM | POA: Diagnosis not present

## 2015-06-03 DIAGNOSIS — E785 Hyperlipidemia, unspecified: Secondary | ICD-10-CM | POA: Diagnosis not present

## 2015-06-03 DIAGNOSIS — M4806 Spinal stenosis, lumbar region: Secondary | ICD-10-CM | POA: Diagnosis not present

## 2015-06-03 DIAGNOSIS — I10 Essential (primary) hypertension: Secondary | ICD-10-CM | POA: Diagnosis not present

## 2015-06-03 DIAGNOSIS — R339 Retention of urine, unspecified: Secondary | ICD-10-CM | POA: Diagnosis not present

## 2015-06-03 DIAGNOSIS — R0602 Shortness of breath: Secondary | ICD-10-CM | POA: Diagnosis not present

## 2015-06-03 DIAGNOSIS — M1990 Unspecified osteoarthritis, unspecified site: Secondary | ICD-10-CM | POA: Diagnosis not present

## 2015-06-03 DIAGNOSIS — M5416 Radiculopathy, lumbar region: Secondary | ICD-10-CM | POA: Diagnosis not present

## 2015-06-03 HISTORY — DX: Dysphagia, unspecified: R13.10

## 2015-06-03 HISTORY — DX: Unspecified osteoarthritis, unspecified site: M19.90

## 2015-06-03 HISTORY — DX: Other complications of anesthesia, initial encounter: T88.59XA

## 2015-06-03 HISTORY — DX: Malignant (primary) neoplasm, unspecified: C80.1

## 2015-06-03 HISTORY — DX: Constipation, unspecified: K59.00

## 2015-06-03 HISTORY — DX: Adverse effect of unspecified anesthetic, initial encounter: T41.45XA

## 2015-06-03 HISTORY — DX: Gastro-esophageal reflux disease without esophagitis: K21.9

## 2015-06-03 HISTORY — DX: Other cervical disc degeneration, unspecified cervical region: M50.30

## 2015-06-03 HISTORY — DX: Hypothyroidism, unspecified: E03.9

## 2015-06-03 LAB — SURGICAL PCR SCREEN
MRSA, PCR: NEGATIVE
Staphylococcus aureus: NEGATIVE

## 2015-06-03 LAB — BASIC METABOLIC PANEL
Anion gap: 9 (ref 5–15)
BUN: 19 mg/dL (ref 6–20)
CO2: 25 mmol/L (ref 22–32)
Calcium: 9.4 mg/dL (ref 8.9–10.3)
Chloride: 103 mmol/L (ref 101–111)
Creatinine, Ser: 0.79 mg/dL (ref 0.61–1.24)
GFR calc Af Amer: >60 mL/min (ref >60)
GFR calc non Af Amer: >60 mL/min (ref >60)
Glucose, Bld: 85 mg/dL (ref 65–99)
Potassium: 4.6 mmol/L (ref 3.5–5.1)
Sodium: 137 mmol/L (ref 135–145)

## 2015-06-03 LAB — CBC
HCT: 51.8 % (ref 39.0–52.0)
HEMOGLOBIN: 18 g/dL — AB (ref 13.0–17.0)
MCH: 31.8 pg (ref 26.0–34.0)
MCHC: 34.7 g/dL (ref 30.0–36.0)
MCV: 91.5 fL (ref 78.0–100.0)
PLATELETS: 191 10*3/uL (ref 150–400)
RBC: 5.66 MIL/uL (ref 4.22–5.81)
RDW: 14.7 % (ref 11.5–15.5)
WBC: 7.1 10*3/uL (ref 4.0–10.5)

## 2015-06-03 NOTE — Pre-Procedure Instructions (Signed)
    [name]  06/03/2015      Your procedure is scheduled on : Monday, Augurt 29.               Report to [facility] Admitting at 9:15 A.M.   Call this number if you have problems the morning of surgery:  [phone]   Remember:  Do not eat food or drink liquids after midnight Sunday.   Take these medicines the morning of surgery with A SIP OF WATER:  BYSTOLIC, levothyroxine (SYNTHROID, LEVOTHROID).               Take if needed: traMADol (ULTRAM), GUAIFENESIN, loratadine (CLARITIN).                 Stop Taking Aspirin, Fish Oil, Naproxen (Aleve), Vitamins.   Do not wear jewelry, make-up or nail polish.   Do not wear lotions, powders, or perfumes.     Men may shave face and neck.  Do not bring valuables to the hospital.   [facility] is not responsible for any belongings or valuables.  Contacts, dentures or bridgework may not be worn into surgery.  Leave your suitcase in the car.  After surgery it may be brought to your room.  For patients admitted to the hospital, discharge time will be determined by your treatment team.   Special instructions: Review  Tool - Preparing For Surgery.  Please read over the following fact sheets that you were given. Pain Booklet, Coughing and Deep Breathing and Surgical Site Infection Prevention

## 2015-06-06 ENCOUNTER — Inpatient Hospital Stay (HOSPITAL_COMMUNITY): Payer: Medicare Other | Admitting: Anesthesiology

## 2015-06-06 ENCOUNTER — Inpatient Hospital Stay (HOSPITAL_COMMUNITY): Payer: Medicare Other

## 2015-06-06 ENCOUNTER — Encounter (HOSPITAL_COMMUNITY)
Admission: RE | Disposition: A | Discharge status: Home / Self Care | Payer: Self-pay | Source: Ambulatory Visit | Attending: Neurological Surgery

## 2015-06-06 ENCOUNTER — Observation Stay (HOSPITAL_COMMUNITY)
Admission: RE | Admit: 2015-06-06 | Discharge: 2015-06-07 | Disposition: A | Discharge status: Home / Self Care | Payer: Medicare Other | Source: Ambulatory Visit | Attending: Neurological Surgery | Admitting: Neurological Surgery

## 2015-06-06 DIAGNOSIS — E039 Hypothyroidism, unspecified: Secondary | ICD-10-CM | POA: Diagnosis not present

## 2015-06-06 DIAGNOSIS — M4806 Spinal stenosis, lumbar region: Secondary | ICD-10-CM | POA: Diagnosis not present

## 2015-06-06 DIAGNOSIS — Z9889 Other specified postprocedural states: Secondary | ICD-10-CM | POA: Diagnosis not present

## 2015-06-06 DIAGNOSIS — G4730 Sleep apnea, unspecified: Secondary | ICD-10-CM | POA: Insufficient documentation

## 2015-06-06 DIAGNOSIS — M1990 Unspecified osteoarthritis, unspecified site: Secondary | ICD-10-CM | POA: Diagnosis not present

## 2015-06-06 DIAGNOSIS — R0602 Shortness of breath: Secondary | ICD-10-CM | POA: Insufficient documentation

## 2015-06-06 DIAGNOSIS — I10 Essential (primary) hypertension: Secondary | ICD-10-CM | POA: Diagnosis not present

## 2015-06-06 DIAGNOSIS — Z419 Encounter for procedure for purposes other than remedying health state, unspecified: Secondary | ICD-10-CM

## 2015-06-06 DIAGNOSIS — E785 Hyperlipidemia, unspecified: Secondary | ICD-10-CM | POA: Insufficient documentation

## 2015-06-06 DIAGNOSIS — M48061 Spinal stenosis, lumbar region without neurogenic claudication: Secondary | ICD-10-CM | POA: Diagnosis present

## 2015-06-06 DIAGNOSIS — M5416 Radiculopathy, lumbar region: Secondary | ICD-10-CM | POA: Insufficient documentation

## 2015-06-06 DIAGNOSIS — K219 Gastro-esophageal reflux disease without esophagitis: Secondary | ICD-10-CM | POA: Diagnosis not present

## 2015-06-06 DIAGNOSIS — G20 Parkinson's disease: Secondary | ICD-10-CM | POA: Insufficient documentation

## 2015-06-06 DIAGNOSIS — R339 Retention of urine, unspecified: Secondary | ICD-10-CM | POA: Insufficient documentation

## 2015-06-06 HISTORY — PX: LUMBAR LAMINECTOMY WITH COFLEX 1 LEVEL: SHX6514

## 2015-06-06 LAB — GLUCOSE, CAPILLARY
GLUCOSE-CAPILLARY: 114 mg/dL — AB (ref 65–99)
GLUCOSE-CAPILLARY: 190 mg/dL — AB (ref 65–99)
Glucose-Capillary: 150 mg/dL — ABNORMAL HIGH (ref 65–99)

## 2015-06-06 SURGERY — LUMBAR LAMINECTOMY WITH COFLEX 1 LEVEL
Anesthesia: General | Site: Spine Lumbar

## 2015-06-06 MED ORDER — KETOROLAC TROMETHAMINE 15 MG/ML IJ SOLN
15.0000 mg | Freq: Four times a day (QID) | INTRAMUSCULAR | Status: DC
  Administered 2015-06-06 – 2015-06-07 (×4): 15 mg via INTRAVENOUS
  Filled 2015-06-06 (×4): qty 1

## 2015-06-06 MED ORDER — POLYETHYLENE GLYCOL 3350 17 G PO PACK: 17.0000 g | PACK | Freq: Every day | ORAL | Status: DC | PRN

## 2015-06-06 MED ORDER — FENTANYL CITRATE (PF) 100 MCG/2ML IJ SOLN
INTRAMUSCULAR | Status: DC | PRN
  Administered 2015-06-06 (×2): 50 ug via INTRAVENOUS

## 2015-06-06 MED ORDER — NEOSTIGMINE METHYLSULFATE 10 MG/10ML IV SOLN
INTRAVENOUS | Status: DC | PRN
  Administered 2015-06-06: 4 mg via INTRAVENOUS

## 2015-06-06 MED ORDER — MORPHINE SULFATE (PF) 2 MG/ML IV SOLN: 1.0000 mg | INTRAVENOUS | Status: DC | PRN

## 2015-06-06 MED ORDER — ONDANSETRON HCL 4 MG/2ML IJ SOLN: 4.0000 mg | INTRAMUSCULAR | Status: DC | PRN

## 2015-06-06 MED ORDER — TAMSULOSIN HCL 0.4 MG PO CAPS
0.4000 mg | ORAL_CAPSULE | Freq: Every day | ORAL | Status: DC
  Administered 2015-06-06 – 2015-06-07 (×2): 0.4 mg via ORAL
  Filled 2015-06-06 (×2): qty 1

## 2015-06-06 MED ORDER — PHENYLEPHRINE 40 MCG/ML (10ML) SYRINGE FOR IV PUSH (FOR BLOOD PRESSURE SUPPORT)
PREFILLED_SYRINGE | INTRAVENOUS | Status: AC
  Filled 2015-06-06: qty 50

## 2015-06-06 MED ORDER — TIMOLOL MALEATE 0.5 % OP SOLN
1.0000 [drp] | Freq: Two times a day (BID) | OPHTHALMIC | Status: DC
  Administered 2015-06-06 – 2015-06-07 (×2): 1 [drp] via OPHTHALMIC
  Filled 2015-06-06: qty 5

## 2015-06-06 MED ORDER — BISACODYL 10 MG RE SUPP: 10.0000 mg | Freq: Every day | RECTAL | Status: DC | PRN

## 2015-06-06 MED ORDER — ROCURONIUM BROMIDE 100 MG/10ML IV SOLN
INTRAVENOUS | Status: DC | PRN
  Administered 2015-06-06: 50 mg via INTRAVENOUS

## 2015-06-06 MED ORDER — ROCURONIUM BROMIDE 50 MG/5ML IV SOLN
INTRAVENOUS | Status: AC
  Filled 2015-06-06: qty 1

## 2015-06-06 MED ORDER — LORATADINE 10 MG PO TABS
10.0000 mg | ORAL_TABLET | Freq: Every day | ORAL | Status: DC
  Administered 2015-06-06: 10 mg via ORAL
  Filled 2015-06-06 (×2): qty 1

## 2015-06-06 MED ORDER — FLUTICASONE PROPIONATE 50 MCG/ACT NA SUSP
1.0000 | Freq: Every day | NASAL | Status: DC
  Filled 2015-06-06: qty 16

## 2015-06-06 MED ORDER — CEFAZOLIN SODIUM-DEXTROSE 2-3 GM-% IV SOLR
2.0000 g | INTRAVENOUS | Status: AC
  Administered 2015-06-06: 2 g via INTRAVENOUS
  Filled 2015-06-06: qty 50

## 2015-06-06 MED ORDER — LIDOCAINE-EPINEPHRINE 1 %-1:100000 IJ SOLN
INTRAMUSCULAR | Status: DC | PRN
  Administered 2015-06-06: 5 mL

## 2015-06-06 MED ORDER — TRAMADOL HCL 50 MG PO TABS: 100.0000 mg | ORAL_TABLET | Freq: Three times a day (TID) | ORAL | Status: DC | PRN

## 2015-06-06 MED ORDER — ALUM & MAG HYDROXIDE-SIMETH 200-200-20 MG/5ML PO SUSP: 30.0000 mL | Freq: Four times a day (QID) | ORAL | Status: DC | PRN

## 2015-06-06 MED ORDER — SODIUM CHLORIDE 0.9 % IJ SOLN: 3.0000 mL | INTRAMUSCULAR | Status: DC | PRN

## 2015-06-06 MED ORDER — SODIUM CHLORIDE 0.9 % IJ SOLN
INTRAMUSCULAR | Status: AC
  Filled 2015-06-06: qty 10

## 2015-06-06 MED ORDER — FLEET ENEMA 7-19 GM/118ML RE ENEM: 1.0000 | ENEMA | Freq: Once | RECTAL | Status: DC | PRN

## 2015-06-06 MED ORDER — LIDOCAINE HCL (CARDIAC) 20 MG/ML IV SOLN
INTRAVENOUS | Status: DC | PRN
  Administered 2015-06-06: 80 mg via INTRAVENOUS

## 2015-06-06 MED ORDER — FENTANYL CITRATE (PF) 250 MCG/5ML IJ SOLN
INTRAMUSCULAR | Status: AC
  Filled 2015-06-06: qty 5

## 2015-06-06 MED ORDER — MIDAZOLAM HCL 2 MG/2ML IJ SOLN
INTRAMUSCULAR | Status: AC
  Filled 2015-06-06: qty 4

## 2015-06-06 MED ORDER — NEBIVOLOL HCL 2.5 MG PO TABS
2.5000 mg | ORAL_TABLET | Freq: Every day | ORAL | Status: DC
  Administered 2015-06-07: 2.5 mg via ORAL
  Filled 2015-06-06: qty 1

## 2015-06-06 MED ORDER — ONDANSETRON HCL 4 MG/2ML IJ SOLN
INTRAMUSCULAR | Status: AC
  Filled 2015-06-06: qty 4

## 2015-06-06 MED ORDER — EPHEDRINE SULFATE 50 MG/ML IJ SOLN
INTRAMUSCULAR | Status: AC
  Filled 2015-06-06: qty 2

## 2015-06-06 MED ORDER — METHOCARBAMOL 500 MG PO TABS: 500.0000 mg | ORAL_TABLET | Freq: Four times a day (QID) | ORAL | Status: DC | PRN

## 2015-06-06 MED ORDER — HEMOSTATIC AGENTS (NO CHARGE) OPTIME
TOPICAL | Status: DC | PRN
  Administered 2015-06-06: 1 via TOPICAL

## 2015-06-06 MED ORDER — ACETAMINOPHEN 650 MG RE SUPP: 650.0000 mg | RECTAL | Status: DC | PRN

## 2015-06-06 MED ORDER — HYDROCODONE-ACETAMINOPHEN 5-325 MG PO TABS: 1.0000 | ORAL_TABLET | ORAL | Status: DC | PRN

## 2015-06-06 MED ORDER — DOCUSATE SODIUM 100 MG PO CAPS
100.0000 mg | ORAL_CAPSULE | Freq: Two times a day (BID) | ORAL | Status: DC
Start: 2015-06-06 — End: 2015-06-07
  Administered 2015-06-06 – 2015-06-07 (×3): 100 mg via ORAL
  Filled 2015-06-06 (×3): qty 1

## 2015-06-06 MED ORDER — PROPOFOL 10 MG/ML IV BOLUS
INTRAVENOUS | Status: DC | PRN
  Administered 2015-06-06: 120 mg via INTRAVENOUS

## 2015-06-06 MED ORDER — LIDOCAINE HCL (CARDIAC) 20 MG/ML IV SOLN
INTRAVENOUS | Status: AC
  Filled 2015-06-06: qty 10

## 2015-06-06 MED ORDER — SENNA 8.6 MG PO TABS
1.0000 | ORAL_TABLET | Freq: Two times a day (BID) | ORAL | Status: DC
Start: 2015-06-06 — End: 2015-06-07
  Administered 2015-06-06 – 2015-06-07 (×3): 8.6 mg via ORAL
  Filled 2015-06-06 (×3): qty 1

## 2015-06-06 MED ORDER — VECURONIUM BROMIDE 10 MG IV SOLR
INTRAVENOUS | Status: AC
  Filled 2015-06-06: qty 10

## 2015-06-06 MED ORDER — MENTHOL 3 MG MT LOZG: 1.0000 | LOZENGE | OROMUCOSAL | Status: DC | PRN

## 2015-06-06 MED ORDER — OXYCODONE-ACETAMINOPHEN 5-325 MG PO TABS: 1.0000 | ORAL_TABLET | ORAL | Status: DC | PRN

## 2015-06-06 MED ORDER — PROPOFOL 10 MG/ML IV BOLUS
INTRAVENOUS | Status: AC
  Filled 2015-06-06: qty 20

## 2015-06-06 MED ORDER — ONDANSETRON HCL 4 MG/2ML IJ SOLN
INTRAMUSCULAR | Status: DC | PRN
  Administered 2015-06-06: 4 mg via INTRAVENOUS

## 2015-06-06 MED ORDER — LACTATED RINGERS IV SOLN
INTRAVENOUS | Status: DC | PRN
  Administered 2015-06-06 (×2): via INTRAVENOUS

## 2015-06-06 MED ORDER — PHENYLEPHRINE HCL 10 MG/ML IJ SOLN
INTRAMUSCULAR | Status: DC | PRN
  Administered 2015-06-06: 40 ug via INTRAVENOUS
  Administered 2015-06-06: 120 ug via INTRAVENOUS
  Administered 2015-06-06: 80 ug via INTRAVENOUS

## 2015-06-06 MED ORDER — SODIUM CHLORIDE 0.9 % IV SOLN
10.0000 mg | INTRAVENOUS | Status: DC | PRN
  Administered 2015-06-06: 25 ug/min via INTRAVENOUS

## 2015-06-06 MED ORDER — KETOROLAC TROMETHAMINE 30 MG/ML IJ SOLN
INTRAMUSCULAR | Status: DC | PRN
  Administered 2015-06-06: 30 mg via INTRAVENOUS

## 2015-06-06 MED ORDER — METHOCARBAMOL 1000 MG/10ML IJ SOLN
500.0000 mg | Freq: Four times a day (QID) | INTRAVENOUS | Status: DC | PRN
  Filled 2015-06-06: qty 5

## 2015-06-06 MED ORDER — LEVOTHYROXINE SODIUM 100 MCG PO TABS
50.0000 ug | ORAL_TABLET | Freq: Every day | ORAL | Status: DC
  Administered 2015-06-07: 50 ug via ORAL
  Filled 2015-06-06: qty 1

## 2015-06-06 MED ORDER — ALBUMIN HUMAN 5 % IV SOLN
INTRAVENOUS | Status: DC | PRN
  Administered 2015-06-06: 08:00:00 via INTRAVENOUS

## 2015-06-06 MED ORDER — BUPIVACAINE HCL (PF) 0.5 % IJ SOLN
INTRAMUSCULAR | Status: DC | PRN
  Administered 2015-06-06: 15 mL
  Administered 2015-06-06: 5 mL

## 2015-06-06 MED ORDER — SODIUM CHLORIDE 0.9 % IV SOLN: 250.0000 mL | INTRAVENOUS | Status: DC

## 2015-06-06 MED ORDER — STERILE WATER FOR INJECTION IJ SOLN
INTRAMUSCULAR | Status: AC
  Filled 2015-06-06: qty 10

## 2015-06-06 MED ORDER — EPHEDRINE SULFATE 50 MG/ML IJ SOLN
INTRAMUSCULAR | Status: DC | PRN
  Administered 2015-06-06 (×2): 10 mg via INTRAVENOUS

## 2015-06-06 MED ORDER — SODIUM CHLORIDE 0.9 % IJ SOLN
3.0000 mL | Freq: Two times a day (BID) | INTRAMUSCULAR | Status: DC
  Administered 2015-06-06 (×2): 3 mL via INTRAVENOUS

## 2015-06-06 MED ORDER — NEOSTIGMINE METHYLSULFATE 10 MG/10ML IV SOLN
INTRAVENOUS | Status: AC
  Filled 2015-06-06: qty 2

## 2015-06-06 MED ORDER — THROMBIN 5000 UNITS EX SOLR
CUTANEOUS | Status: DC | PRN
  Administered 2015-06-06 (×2): 5000 [IU] via TOPICAL

## 2015-06-06 MED ORDER — SUCCINYLCHOLINE CHLORIDE 20 MG/ML IJ SOLN
INTRAMUSCULAR | Status: AC
Start: 2015-06-06 — End: 2015-06-06
  Filled 2015-06-06: qty 1

## 2015-06-06 MED ORDER — PHENOL 1.4 % MT LIQD: 1.0000 | OROMUCOSAL | Status: DC | PRN

## 2015-06-06 MED ORDER — LOSARTAN POTASSIUM 50 MG PO TABS
100.0000 mg | ORAL_TABLET | Freq: Every day | ORAL | Status: DC
Start: 2015-06-06 — End: 2015-06-07
  Administered 2015-06-06: 100 mg via ORAL
  Filled 2015-06-06 (×2): qty 2

## 2015-06-06 MED ORDER — DEXAMETHASONE SODIUM PHOSPHATE 10 MG/ML IJ SOLN
INTRAMUSCULAR | Status: DC | PRN
  Administered 2015-06-06: 10 mg via INTRAVENOUS

## 2015-06-06 MED ORDER — SODIUM CHLORIDE 0.9 % IR SOLN
Status: DC | PRN
  Administered 2015-06-06: 500 mL

## 2015-06-06 MED ORDER — MIDAZOLAM HCL 5 MG/5ML IJ SOLN
INTRAMUSCULAR | Status: DC | PRN
  Administered 2015-06-06: 2 mg via INTRAVENOUS

## 2015-06-06 MED ORDER — GLYCOPYRROLATE 0.2 MG/ML IJ SOLN
INTRAMUSCULAR | Status: AC
  Filled 2015-06-06: qty 9

## 2015-06-06 MED ORDER — GUAIFENESIN 200 MG PO TABS
400.0000 mg | ORAL_TABLET | Freq: Four times a day (QID) | ORAL | Status: DC | PRN
  Filled 2015-06-06: qty 2

## 2015-06-06 MED ORDER — 0.9 % SODIUM CHLORIDE (POUR BTL) OPTIME
TOPICAL | Status: DC | PRN
  Administered 2015-06-06: 1000 mL

## 2015-06-06 MED ORDER — GLYCOPYRROLATE 0.2 MG/ML IJ SOLN
INTRAMUSCULAR | Status: DC | PRN
  Administered 2015-06-06: .4 mg via INTRAVENOUS
  Administered 2015-06-06: 0.6 mg via INTRAVENOUS

## 2015-06-06 MED ORDER — INSULIN ASPART 100 UNIT/ML ~~LOC~~ SOLN
0.0000 [IU] | Freq: Three times a day (TID) | SUBCUTANEOUS | Status: DC
  Administered 2015-06-06: 2 [IU] via SUBCUTANEOUS

## 2015-06-06 MED ORDER — ACETAMINOPHEN 325 MG PO TABS: 650.0000 mg | ORAL_TABLET | ORAL | Status: DC | PRN

## 2015-06-06 SURGICAL SUPPLY — 54 items
BAG DECANTER FOR FLEXI CONT (MISCELLANEOUS) ×3 IMPLANT
BLADE CLIPPER SURG (BLADE) ×3 IMPLANT
BUR ACORN 6.0 (BURR) IMPLANT
BUR ACORN 6.0MM (BURR)
BUR MATCHSTICK NEURO 3.0 LAGG (BURR) ×3 IMPLANT
CANISTER SUCT 3000ML PPV (MISCELLANEOUS) ×3 IMPLANT
DECANTER SPIKE VIAL GLASS SM (MISCELLANEOUS) ×3 IMPLANT
DERMABOND ADVANCED (GAUZE/BANDAGES/DRESSINGS) ×2
DERMABOND ADVANCED .7 DNX12 (GAUZE/BANDAGES/DRESSINGS) ×1 IMPLANT
DEVICE COFLEX STABLIZATION 10M (Neuro Prosthesis/Implant) ×3 IMPLANT
DRAPE C-ARM 42X72 X-RAY (DRAPES) ×6 IMPLANT
DRAPE C-ARMOR (DRAPES) ×3 IMPLANT
DRAPE LAPAROTOMY T 102X78X121 (DRAPES) ×3 IMPLANT
DRAPE MICROSCOPE LEICA (MISCELLANEOUS) IMPLANT
DRAPE POUCH INSTRU U-SHP 10X18 (DRAPES) ×3 IMPLANT
DRAPE PROXIMA HALF (DRAPES) IMPLANT
DURAPREP 26ML APPLICATOR (WOUND CARE) ×3 IMPLANT
ELECT REM PT RETURN 9FT ADLT (ELECTROSURGICAL) ×3
ELECTRODE REM PT RTRN 9FT ADLT (ELECTROSURGICAL) ×1 IMPLANT
GAUZE SPONGE 4X4 12PLY STRL (GAUZE/BANDAGES/DRESSINGS) IMPLANT
GAUZE SPONGE 4X4 16PLY XRAY LF (GAUZE/BANDAGES/DRESSINGS) IMPLANT
GLOVE BIOGEL PI IND STRL 7.0 (GLOVE) ×4 IMPLANT
GLOVE BIOGEL PI IND STRL 8.5 (GLOVE) ×1 IMPLANT
GLOVE BIOGEL PI INDICATOR 7.0 (GLOVE) ×8
GLOVE BIOGEL PI INDICATOR 8.5 (GLOVE) ×2
GLOVE ECLIPSE 8.5 STRL (GLOVE) ×3 IMPLANT
GLOVE ECLIPSE 9.0 STRL (GLOVE) ×3 IMPLANT
GLOVE EXAM NITRILE LRG STRL (GLOVE) IMPLANT
GLOVE EXAM NITRILE MD LF STRL (GLOVE) IMPLANT
GLOVE EXAM NITRILE XL STR (GLOVE) IMPLANT
GLOVE EXAM NITRILE XS STR PU (GLOVE) IMPLANT
GOWN STRL REUS W/ TWL LRG LVL3 (GOWN DISPOSABLE) ×2 IMPLANT
GOWN STRL REUS W/ TWL XL LVL3 (GOWN DISPOSABLE) ×1 IMPLANT
GOWN STRL REUS W/TWL 2XL LVL3 (GOWN DISPOSABLE) ×3 IMPLANT
GOWN STRL REUS W/TWL LRG LVL3 (GOWN DISPOSABLE) ×4
GOWN STRL REUS W/TWL XL LVL3 (GOWN DISPOSABLE) ×2
KIT BASIN OR (CUSTOM PROCEDURE TRAY) ×3 IMPLANT
KIT ROOM TURNOVER OR (KITS) ×3 IMPLANT
NEEDLE HYPO 22GX1.5 SAFETY (NEEDLE) ×3 IMPLANT
NEEDLE SPNL 20GX3.5 QUINCKE YW (NEEDLE) ×3 IMPLANT
NS IRRIG 1000ML POUR BTL (IV SOLUTION) ×3 IMPLANT
PACK LAMINECTOMY NEURO (CUSTOM PROCEDURE TRAY) ×3 IMPLANT
PAD ARMBOARD 7.5X6 YLW CONV (MISCELLANEOUS) ×15 IMPLANT
PATTIES SURGICAL .5 X1 (DISPOSABLE) ×3 IMPLANT
RUBBERBAND STERILE (MISCELLANEOUS) IMPLANT
SPONGE SURGIFOAM ABS GEL SZ50 (HEMOSTASIS) ×3 IMPLANT
SUT VIC AB 1 CT1 18XBRD ANBCTR (SUTURE) ×1 IMPLANT
SUT VIC AB 1 CT1 8-18 (SUTURE) ×2
SUT VIC AB 2-0 CP2 18 (SUTURE) ×3 IMPLANT
SUT VIC AB 3-0 SH 8-18 (SUTURE) ×3 IMPLANT
SYR 20ML ECCENTRIC (SYRINGE) ×3 IMPLANT
TOWEL OR 17X24 6PK STRL BLUE (TOWEL DISPOSABLE) ×3 IMPLANT
TOWEL OR 17X26 10 PK STRL BLUE (TOWEL DISPOSABLE) ×3 IMPLANT
WATER STERILE IRR 1000ML POUR (IV SOLUTION) ×3 IMPLANT

## 2015-06-06 NOTE — Anesthesia Procedure Notes (Signed)
Procedure Name: Intubation Date/Time: 06/06/2015 7:51 AM Performed by: Neldon Newport Pre-anesthesia Checklist: Patient being monitored, Suction available, Emergency Drugs available, Patient identified and Timeout performed Patient Re-evaluated:Patient Re-evaluated prior to inductionOxygen Delivery Method: Circle system utilized Preoxygenation: Pre-oxygenation with 100% oxygen Intubation Type: IV induction Ventilation: Mask ventilation without difficulty Laryngoscope Size: Mac and 3 Grade View: Grade II Tube type: Oral Tube size: 7.5 mm Number of attempts: 3 Placement Confirmation: positive ETCO2,  ETT inserted through vocal cords under direct vision and breath sounds checked- equal and bilateral Secured at: 22 cm Tube secured with: Tape Dental Injury: Teeth and Oropharynx as per pre-operative assessment

## 2015-06-06 NOTE — Evaluation (Addendum)
Occupational Therapy Evaluation Patient Details Name: [name] MRN: [id number] DOB: 1946/09/01 Today's Date: 06/06/2015    History of Present Illness 69 y.o. male who has been diagnosed with Parkinsonism in the past year. s/p L3-L4 bilateral laminotomies and foraminotomies, placement of Coflex.   Clinical Impression   Pt s/p above. Pt independent with ADLs, PTA. Feel pt will benefit from acute OT to increase independence prior to d/c.     Follow Up Recommendations  Home health OT    Equipment Recommendations  Other (comment) (AE; 3 in 1 versus shower seat)    Recommendations for Other Services       Precautions / Restrictions Precautions Precautions: Back;Fall Precaution Booklet Issued: Yes (comment) Precaution Comments: educated on back precautions; pt able to recall 3/3 later in session Required Braces or Orthoses: Spinal Brace Spinal Brace: Lumbar corset (already on in session; doffed in sitting) Restrictions Weight Bearing Restrictions: No      Mobility Bed Mobility Overal bed mobility: Needs Assistance Bed Mobility: Rolling;Sit to Sidelying Rolling: Supervision       Sit to sidelying: Supervision General bed mobility comments: cues for technique. Tactile and verbal cues  Transfers Overall transfer level: Needs assistance   Transfers: Sit to/from Stand Sit to Stand: Supervision;Min guard         General transfer comment: cues to stand up straight. Pt with LOB when backing up to bed-assist to help control descent when going to sit on bed.    Balance Overall balance assessment: History of Falls                                          ADL Overall ADL's : Needs assistance/impaired Eating/Feeding: Independent;Sitting Eating/Feeding Details (indicate cue type and reason): noted he did start coughing with taking meds             Upper Body Dressing : Set up;Supervision/safety;Sitting (able to doff brace; did not practice  donning brace)   Lower Body Dressing: Minimal assistance;With adaptive equipment;Sit to/from stand   Toilet Transfer: Min guard;Supervision/safety;Minimal assistance;Ambulation;RW (sit to stand from bed)           Functional mobility during ADLs: Min guard;Supervision/safety;Minimal assistance;Rolling walker (LOB when backing to bed) General ADL Comments: Discussed incorporating precautions into functional tasks. Educated on back brace. Educated on safety such as rugs/items on floor and use of bag on walker.  Educated on tub transfer technique and recommended pt sit on chair for LB bathing. Educated on options for shower chair. Educated on AE and pt practiced with reacher and sockaid. Pt had LOB backing up to bed.      Vision     Perception     Praxis      Pertinent Vitals/Pain Pain Assessment: 0-10 Pain Score: 2  Pain Location: back Pain Descriptors / Indicators: Sore Pain Intervention(s): Repositioned;Monitored during session     Hand Dominance Left   Extremity/Trunk Assessment Upper Extremity Assessment Upper Extremity Assessment: Overall WFL for tasks assessed   Lower Extremity Assessment Lower Extremity Assessment: Defer to PT evaluation       Communication Communication Communication: Expressive difficulties   Cognition Arousal/Alertness: Awake/alert Behavior During Therapy: WFL for tasks assessed/performed; flat affect majority of session Overall Cognitive Status: Within Functional Limits for tasks assessed                     General Comments  Exercises       Shoulder Instructions      Home Living Family/patient expects to be discharged to:: Private residence Living Arrangements: Spouse/significant other Available Help at Discharge: Family (sounds like most of the time) Type of Home: House Home Access: Stairs to enter [employer] of Steps: 3 Entrance Stairs-Rails: Right Home Layout: Two level;Able to live on main level  with bedroom/bathroom     Bathroom Shower/Tub: Tub/shower unit   Bathroom Toilet: Handicapped height     Home Equipment: [place] - single point;Walker - 2 wheels;Grab bars - tub/shower;Adaptive equipment;Grab bars - toilet Adaptive Equipment: Reacher        Prior Functioning/Environment Level of Independence: Needs assistance  Gait / Transfers Assistance Needed: used walker ADL's / Homemaking Assistance Needed: assist with LB dressing (shoes/socks)        OT Diagnosis: Acute pain   OT Problem List: Impaired balance (sitting and/or standing);Decreased knowledge of use of DME or AE;Decreased range of motion;Pain;Decreased knowledge of precautions   OT Treatment/Interventions: DME and/or AE instruction;Therapeutic activities;Patient/family education;Balance training;Self-care/ADL training    OT Goals(Current goals can be found in the care plan section) Acute Rehab OT Goals Patient Stated Goal: wanted to walk OT Goal Formulation: With patient Time For Goal Achievement: 06/13/15 Potential to Achieve Goals: Good ADL Goals Pt Will Perform Lower Body Dressing: with adaptive equipment;sit to/from stand;with set-up Pt Will Transfer to Toilet: with modified independence;ambulating Pt Will Perform Toileting - Clothing Manipulation and hygiene: with modified independence;sit to/from stand Pt Will Perform Tub/Shower Transfer: Tub transfer;with supervision;ambulating;rolling walker (3 in 1 versus shower seat) Additional ADL Goal #1: Pt will independently verbalize 3/3 back precautions and maintain during session.  OT Frequency: Min 2X/week   Barriers to D/C:            Co-evaluation              End of Session Equipment Utilized During Treatment: Gait belt;Rolling walker;Back brace Nurse Communication: Mobility status;Other (comment) (pain level; d/c recommendation and DME; coughing with taking meds)  Activity Tolerance: Patient tolerated treatment well Patient left: in  bed;with call bell/phone within reach   Time: 7654-6503 OT Time Calculation (min): 32 min Charges:  OT General Charges $OT Visit: 1 Procedure OT Evaluation $Initial OT Evaluation Tier I: 1 Procedure OT Treatments $Self Care/Home Management : 8-22 mins G-Codes: OT G-codes **NOT FOR INPATIENT CLASS** Functional Assessment Tool Used: clinical judgment Functional Limitation: Self care Self Care Current Status (T4656): At least 1 percent but less than 20 percent impaired, limited or restricted Self Care Goal Status (C1275): At least 1 percent but less than 20 percent impaired, limited or restricted  Benito Mccreedy OTR/L [phone] 06/06/2015, 4:29 PM

## 2015-06-06 NOTE — Anesthesia Preprocedure Evaluation (Addendum)
Anesthesia Evaluation  Patient identified by MRN, date of birth, ID band Patient awake    Reviewed: Allergy & Precautions, H&P , NPO status , Patient's Chart, lab work & pertinent test results  History of Anesthesia Complications (+) PROLONGED EMERGENCE and history of anesthetic complications  Airway Mallampati: III  TM Distance: <3 FB Neck ROM: full    Dental  (+) Teeth Intact, Dental Advidsory Given   Pulmonary shortness of breath and with exertion, sleep apnea ,  breath sounds clear to auscultation        Cardiovascular hypertension, + DOE Rhythm:regular     Neuro/Psych    GI/Hepatic GERD-  Medicated and Controlled,  Endo/Other  Hypothyroidism   Renal/GU      Musculoskeletal  (+) Arthritis -,   Abdominal   Peds  Hematology   Anesthesia Other Findings   Reproductive/Obstetrics                          Anesthesia Physical Anesthesia Plan  ASA: III  Anesthesia Plan: General   Post-op Pain Management:    Induction: Intravenous  Airway Management Planned: Oral ETT  Additional Equipment: None  Intra-op Plan:   Post-operative Plan: Extubation in OR  Informed Consent: I have reviewed the patients History and Physical, chart, labs and discussed the procedure including the risks, benefits and alternatives for the proposed anesthesia with the patient or authorized representative who has indicated his/her understanding and acceptance.   Dental Advisory Given and Dental advisory given  Plan Discussed with: Anesthesiologist, Surgeon and CRNA  Anesthesia Plan Comments:        Anesthesia Quick Evaluation

## 2015-06-06 NOTE — Progress Notes (Signed)
Call to Dr. Ermalene Postin, reported pt. History, normal Stress test in 12/15, last saw Dr. Ashok Norris- 12/2014. Pt. Has Parkinson's Disease & reports that he wakes slowly from [facility].

## 2015-06-06 NOTE — Transfer of Care (Signed)
Immediate Anesthesia Transfer of Care Note  Patient: [name]  Procedure(s) Performed: Procedure(s): Lumbar three-four Laminectomy/Foraminotomy with placement of Coflex (N/A)  Patient Location: PACU  Anesthesia Type:General  Level of Consciousness: awake, alert  and oriented  Airway & Oxygen Therapy: Patient Spontanous Breathing and Patient connected to nasal cannula oxygen  Post-op Assessment: Report given to RN, Post -op Vital signs reviewed and stable and Patient moving all extremities X 4  Post vital signs: Reviewed and stable  Last Vitals:  Filed Vitals:   06/06/15 0954  BP: 128/71  Pulse: 75  Temp: 36.7 C  Resp: 15    Complications: No apparent anesthesia complications

## 2015-06-06 NOTE — Progress Notes (Signed)
Orthopedic Tech Progress Note Patient Details:  Colbie Danner 06-19-1946 [id number]  Patient ID: [name], male   DOB: [birth year], 69 y.o.   MRN: [id number] Called in bio-tech brace order; spoke with Raul Del, Vaiden Adames 06/06/2015, 12:20 PM

## 2015-06-06 NOTE — Op Note (Signed)
Date of surgery: 06/06/2015 Preoperative diagnosis: Lumbar stenosis with neurogenic claudication, lumbar radiculopathy, L3-L4 Postoperative diagnosis: Lumbar stenosis with neurogenic claudication, lumbar radiculopathy, L3-L4 Procedure: L3-L4 bilateral laminotomies and foraminotomies, placement of Coflex Surgeon: [name] Asst.: Deri Fuelling M.D. Anesthesia: Gen. endotracheal Indications: [name] a 69 year old individual who's had significant back pain with lower extremity pain and weakness in addition to significant stiffness. The patient has an underlying diagnosis of parkinsonism use found to have severe spondylitic stenosis at the level of L3-L4. He's had previous decompression at L4-L5.  Procedure: Patient was brought to the operating room supine on a stretcher. After the smooth induction of general endotracheal anesthesia, he was turned prone. The back was prepped with alcohol and DuraPrep and draped in a sterile fashion. A midline incision was created in the lower lumbar spine after localizing the L3-L4 level with a needle. The dissection was carried down to the lumbar dorsal fascia and the fascia was opened with a subperiosteal dissection on either side of L3-L4. A second confirmatory radiograph was obtained identifying L3-L4 interspace positively. Then starting on the right side laminotomy and foraminotomies were created removing thick and redundant yellow ligament from the interlaminar space at L3-L4 on that side. Superiorly the path of the L4 nerve root could be sounded and this was decompressed by undercutting the superior articular process of L3 substantially. Inferiorly the L4 nerve root was decompressed with a 2 and a 3 mm Kerrison punch. A series of straight and curved Kerrison punches were used to facilitate wide decompression. Once all the old ligament and bone was undercut adequately attention was turned to the left side were similar process was carried out and decompressing the  L3 nerve root superiorly and the L4 nerve root inferiorly. The interspinous ligament was then taken down between L3 and L4. The interspace was then sized first was an 8 mm sizer which was fairly loose and then a 10 mm sizer which felt snug and provided a bit of distraction. A 10 mm Coflex was then placed into the interspace between the spinal processes.  Once the process is completed final radiographs were obtained which demonstrated good placement of Coflex device. Lumbar dorsal fascia was then closed with #1 Vicryls interrupted fashion 20 Vicryls using the subcutaneous tissues 30 Vicryls subcuticularly. Dermabond was placed on the skin. Blood loss was estimated at 50 mL.

## 2015-06-06 NOTE — Progress Notes (Signed)
Patient ID: [name], male   DOB: 11/20/1945, 69 y.o.   MRN: [id number] Alert awake, feels better with legs. Has ambulated already Motor function intact  Dressing dry Urinary retention noted  will add flomax

## 2015-06-06 NOTE — Progress Notes (Signed)
Orthopedic Tech Progress Note Patient Details:  [name] May 28, 1946 [id number] Brace order completed by bio-tech vendor. Patient ID: [name], male   DOB: 06-May-1946, 69 y.o.   MRN: [id number]   [name] 06/06/2015, 4:35 PM

## 2015-06-06 NOTE — Plan of Care (Signed)
Problem: Consults Goal: Diagnosis - Spinal Surgery Outcome: Completed/Met Date Met:  06/06/15 Lumbar Laminectomy (Complex)

## 2015-06-06 NOTE — H&P (Signed)
[name] is an 69 y.o. male.   Chiefa Complaint: back and leg pain with weakness HPI: [name] is a 69 yo male who has been diagnosed with Parkinsonism in the past year. He has a history of cervical spondylosis that was decompressed several years ago. Recently he has had increased back pain and leg rigidity with balance issues. He has been found to have significant stenosis at L3-4. He is admitted now for surgical decompression and placement of a Coflex device.  Past Medical History  Diagnosis Date  . HTN (hypertension)   . Glaucoma   . Allergic rhinitis   . Parkinsonism 01/16/2013  . Hyperlipidemia   . Thyroid disease     HYPOTHYROID  . Hypogonadism male   . Pre-diabetes   . Complication of anesthesia     Slow to awaken  . Dysphagia   . Hypothyroidism   . Constipation   . GERD (gastroesophageal reflux disease)     Decdreased, has decreased coffee intack.  . Arthritis   . DDD (degenerative disc disease), cervical   . Cancer     basal cell skin cancer  . OSA (obstructive sleep apnea)     Has had surgery x 2    Past Surgical History  Procedure Laterality Date  . Uvulopalatopharyngoplasty    . Lumbar disc surgery    . Cataract extraction    . I&d extremity Right 10/18/2014    Procedure: IRRIGATION AND DEBRIDEMENT RIGHT KNEE PREPATELLA BURSA INFECTION;  Surgeon: [name], MD;  Location: [place];  Service: Orthopedics;  Laterality: Right;  . Tonsillectomy      and adenoids  . Nasal septum surgery      Family History  Problem Relation Age of Onset  . Emphysema Mother   . COPD Mother   . Heart disease Mother   . Liver cancer Father   . Cancer Father   . Diabetes Father   . Heart disease Sister   . COPD Sister   . Heart attack Sister   . Hypertension Mother   . Hypertension Father   . Hypertension Sister   . Stroke Neg Hx    Social History:  reports that he has never smoked. He has never used smokeless tobacco. He reports that he does not drink alcohol or use  illicit drugs.  Allergies:  Allergies  Allergen Reactions  . Ambien [Zolpidem Tartrate]     Unknown- balance  . Atenolol     unknown  . Lunesta [Eszopiclone]     unknown  . Oxazepam     unknown  . Requip [Ropinirole Hcl]     unknown  . Ziac [Bisoprolol-Hydrochlorothiazide]     unknown    No prescriptions prior to admission    No results found for this or any previous visit (from the past 48 hour(s)). No results found.  Review of Systems  Constitutional: Positive for malaise/fatigue.  HENT:       Balance problems  Eyes: Negative.   Respiratory: Negative.   Cardiovascular: Negative.   Gastrointestinal: Negative.   Genitourinary: Negative.   Musculoskeletal: Positive for back pain.  Skin: Negative.   Neurological: Positive for sensory change, speech change, focal weakness and weakness.  Endo/Heme/Allergies: Negative.   Psychiatric/Behavioral: Negative.     There were no vitals taken for this visit. Physical Exam  Constitutional: He is oriented to person, place, and time. He appears well-developed and well-nourished.  HENT:  Head: Normocephalic and atraumatic.  Eyes: Conjunctivae are normal. Pupils are equal,  round, and reactive to light.  Neck: Normal range of motion. Neck supple.  Cardiovascular: Normal rate and regular rhythm.   Respiratory: Effort normal and breath sounds normal.  GI: Soft. Bowel sounds are normal.  Musculoskeletal:  Rigidity and cogwheel motion consistent with parkinsonism  Neurological: He is alert and oriented to person, place, and time.  bradykinesia  Skin: Skin is warm and dry.  Psychiatric: He has a normal mood and affect. His behavior is normal. Judgment and thought content normal.     Assessment/Plan Stenosis at L3-4.  Neurogenic Claudication. Parkinsonism Admit for surgical decompression of L3-4 placement of Coflex  [name] J 06/06/2015, 4:33 AM

## 2015-06-06 NOTE — Evaluation (Addendum)
Physical Therapy Evaluation Patient Details Name: [name] MRN: [id number] DOB: [birth year] Today's Date: 06/06/2015   History of Present Illness  69 y.o. male who has been diagnosed with Parkinsonism in the past year. s/p L3-L4 bilateral laminotomies and foraminotomies, placement of Coflex.  Clinical Impression  Pt admitted with/for Lumbar lami and foraminectomies.  Pt currently limited functionally due to the problems listed below.  (see problems list.)  Pt will benefit from PT to maximize function and safety to be able to get home safely with available assist of family.     Follow Up Recommendations Home health PT    Equipment Recommendations  None recommended by PT    Recommendations for Other Services       Precautions / Restrictions Precautions Precautions: Back;Fall Precaution Booklet Issued: Yes (comment) Precaution Comments: educated on back precautions; pt able to recall 3/3 later in session Required Braces or Orthoses: Spinal Brace Spinal Brace: Lumbar corset (already on in session; doffed in sitting) Restrictions Weight Bearing Restrictions: No      Mobility  Bed Mobility Overal bed mobility: Needs Assistance Bed Mobility: Rolling;Sit to Sidelying;Sidelying to Sit Rolling: Supervision Sidelying to sit: Min guard     Sit to sidelying: Supervision General bed mobility comments: cues for technique. Tactile and verbal cues  Transfers Overall transfer level: Needs assistance   Transfers: Sit to/from Stand Sit to Stand: Min guard         General transfer comment: cues to stand up straight.  Ambulation/Gait Ambulation/Gait assistance: Supervision;Min guard Ambulation Distance (Feet): 250 Feet Assistive device: Rolling walker (2 wheeled) Gait Pattern/deviations: Step-through pattern Gait velocity: slower Gait velocity interpretation: Below normal speed for age/gender General Gait Details: Generally steady until turns or change in surface then pt  festinates mildly.  Stairs Stairs: Yes Stairs assistance: Min guard Stair Management: One rail Right;Step to pattern;Forwards;Sideways Number of Stairs: 6 General stair comments: needs rail, guard is preferable  Wheelchair Mobility    Modified Rankin (Stroke Patients Only)       Balance Overall balance assessment: History of Falls                                           Pertinent Vitals/Pain Pain Assessment: 0-10 Pain Score: 2  Pain Location: back Pain Descriptors / Indicators: Aching Pain Intervention(s): Repositioned;Monitored during session    Home Living Family/patient expects to be discharged to:: Private residence Living Arrangements: Spouse/significant other Available Help at Discharge: Family (sounds like most of the time) Type of Home: House Home Access: Stairs to enter Entrance Stairs-Rails: Right Entrance Stairs-Number of Steps: 3 Home Layout: Two level;Able to live on main level with bedroom/bathroom Home Equipment: [employer] - single point;Walker - 2 wheels;Grab bars - tub/shower;Adaptive equipment;Grab bars - toilet      Prior Function Level of Independence: Needs assistance   Gait / Transfers Assistance Needed: used walker  ADL's / Homemaking Assistance Needed: assist with LB dressing (shoes/socks)        Hand Dominance   Dominant Hand: Left    Extremity/Trunk Assessment   Upper Extremity Assessment: Defer to OT evaluation           Lower Extremity Assessment: Generalized weakness;Overall WFL for tasks assessed         Communication   Communication: Expressive difficulties  Cognition Arousal/Alertness: Awake/alert Behavior During Therapy: WFL for tasks assessed/performed Overall Cognitive Status: Within Functional Limits for  tasks assessed                      General Comments General comments (skin integrity, edema, etc.): Advised to pt back care/prec, log roll, transitions to/from sit, lifting  restrictions, bracing issues and progression of mobility.    Exercises        Assessment/Plan    PT Assessment Patient needs continued PT services  PT Diagnosis Generalized weakness;Acute pain;Abnormality of gait   PT Problem List Decreased strength;Decreased mobility;Decreased knowledge of use of DME;Decreased knowledge of precautions;Pain;Other (comment) (parkinsonisms)  PT Treatment Interventions DME instruction;Gait training;Functional mobility training;Therapeutic activities;Patient/family education   PT Goals (Current goals can be found in the Care Plan section) Acute Rehab PT Goals Patient Stated Goal: wanted to walk PT Goal Formulation: With patient Time For Goal Achievement: 06/09/15 Potential to Achieve Goals: Good    Frequency Min 5X/week   Barriers to discharge        Co-evaluation               End of Session Equipment Utilized During Treatment: Back brace Activity Tolerance: Patient tolerated treatment well Patient left: Other (comment);with call bell/phone within reach (sitting EOB) Nurse Communication: Mobility status         Time: 1314-3888 PT Time Calculation (min) (ACUTE ONLY): 25 min   Charges:   PT Evaluation $Initial PT Evaluation Tier I: 1 Procedure PT Treatments $Gait Training: 8-22 mins   PT G Codes:        Hani Patnode, Tessie Fass 06/06/2015, 5:28 PM 06/06/2015  Donnella Sham, PT [phone]  (pager)

## 2015-06-07 ENCOUNTER — Encounter (HOSPITAL_COMMUNITY): Payer: Self-pay | Admitting: Neurological Surgery

## 2015-06-07 DIAGNOSIS — M4806 Spinal stenosis, lumbar region: Secondary | ICD-10-CM | POA: Diagnosis not present

## 2015-06-07 DIAGNOSIS — G20 Parkinson's disease: Secondary | ICD-10-CM | POA: Diagnosis not present

## 2015-06-07 DIAGNOSIS — M1990 Unspecified osteoarthritis, unspecified site: Secondary | ICD-10-CM | POA: Diagnosis not present

## 2015-06-07 DIAGNOSIS — M5416 Radiculopathy, lumbar region: Secondary | ICD-10-CM | POA: Diagnosis not present

## 2015-06-07 DIAGNOSIS — E039 Hypothyroidism, unspecified: Secondary | ICD-10-CM | POA: Diagnosis not present

## 2015-06-07 DIAGNOSIS — I10 Essential (primary) hypertension: Secondary | ICD-10-CM | POA: Diagnosis not present

## 2015-06-07 LAB — GLUCOSE, CAPILLARY
GLUCOSE-CAPILLARY: 103 mg/dL — AB (ref 65–99)
GLUCOSE-CAPILLARY: 91 mg/dL (ref 65–99)

## 2015-06-07 MED ORDER — METHOCARBAMOL 500 MG PO TABS: 500.0000 mg | ORAL_TABLET | Freq: Four times a day (QID) | ORAL | Status: DC | PRN

## 2015-06-07 NOTE — Care Management Note (Signed)
Case Management Note  Patient Details  Name: [name] MRN: [id number] Date of Birth: [birth year]  Subjective/Objective:    69 yr old male s/p L3-4 laminectomy/foraminotomy with placement of coflex                Action/Plan:    Expected Discharge Date:                  Expected Discharge Plan:     In-House Referral:  NA  Discharge planning Services  CM Consult  Post Acute Care Choice:  Home Health Choice offered to:  Spouse  DME Arranged:  N/A DME Agency:  NA  HH Arranged:  PT, OT [place] Agency:  [facility]  Status of Service:  Completed, signed off  Medicare Important Message Given:    Date Medicare IM Given:    Medicare IM give by:    Date Additional Medicare IM Given:    Additional Medicare Important Message give by:     If discussed at [place] of Stay Meetings, dates discussed:    Additional Comments:  Ninfa Meeker, RN 06/07/2015, 12:29 PM

## 2015-06-07 NOTE — Progress Notes (Addendum)
Occupational Therapy Treatment Patient Details Name: [name] MRN: [id number] DOB: [birth year] Today's Date: 06/07/2015    History of present illness 69 y.o. male who has been diagnosed with Parkinsonism in the past year. s/p L3-L4 bilateral laminotomies and foraminotomies, placement of Coflex.   OT comments  Pt progressing. Education provided in session. Plan is for pt to d/c today. Wife present for education.  Follow Up Recommendations  Home health OT    Equipment Recommendations  Other (comment) (wife to get shower chair)    Recommendations for Other Services      Precautions / Restrictions Precautions Precautions: Back;Fall Precaution Booklet Issued:  (one in room) Precaution Comments: pt able to recall 3/3 back precautions Required Braces or Orthoses: Spinal Brace Spinal Brace: Lumbar corset;Applied in sitting position (adjusted in standing) Restrictions Weight Bearing Restrictions: No       Mobility Bed Mobility Overal bed mobility: Needs Assistance Bed Mobility: Rolling;Sidelying to Sit Rolling: Supervision Sidelying to sit: Supervision       General bed mobility comments: cues for technique/verbal and tactile cues.  Transfers Overall transfer level: Needs assistance   Transfers: Sit to/from Stand Sit to Stand: Min guard              Balance    Min guard for ambulation with RW.                               ADL Overall ADL's : Needs assistance/impaired                 Upper Body Dressing : Minimal assistance;Sitting;Standing Upper Body Dressing Details (indicate cue type and reason): back brace Lower Body Dressing: Sitting/lateral leans;Set up;Supervision/safety Lower Body Dressing Details (indicate cue type and reason): donned/doffed sock Toilet Transfer: Min guard;Ambulation;RW (sit to stand from bed)           Functional mobility during ADLs: Min guard;Rolling walker General ADL Comments: Educated on options for  shower chair and tub transfer technique if he got a tub bench. Wife plans to find him a shower chair. Educated on back brace. Discussed incorporating precautions into functional activities. Cues for precautions in session. Educated on AE and pt practiced with reacher and sockaid.     Vision                     Perception     Praxis      Cognition  Awake/Alert Behavior During Therapy: Flat affect Overall Cognitive Status: Within Functional Limits for tasks assessed                       Extremity/Trunk Assessment               Exercises     Shoulder Instructions       General Comments      Pertinent Vitals/ Pain       Pain Assessment: 0-10 Pain Score:  (reports it was a 4 earlier today) Pain Location: back Pain Descriptors / Indicators: Tightness;Tiring Pain Intervention(s): Monitored during session  Home Living                                          Prior Functioning/Environment              Frequency Min 2X/week  Progress Toward Goals  OT Goals(current goals can now be found in the care plan section)  Progress towards OT goals: Progressing toward goals  Acute Rehab OT Goals Patient Stated Goal: not stated OT Goal Formulation: With patient Time For Goal Achievement: 06/13/15 Potential to Achieve Goals: Good ADL Goals Pt Will Perform Lower Body Dressing: with adaptive equipment;sit to/from stand;with set-up Pt Will Transfer to Toilet: with modified independence;ambulating Pt Will Perform Toileting - Clothing Manipulation and hygiene: with modified independence;sit to/from stand Pt Will Perform Tub/Shower Transfer: Tub transfer;with supervision;ambulating;rolling walker (3 in 1 versus shower seat) Additional ADL Goal #1: Pt will independently verbalize 3/3 back precautions and maintain during session.  Plan Discharge plan remains appropriate    Co-evaluation                 End of Session Equipment  Utilized During Treatment: Gait belt;Rolling walker;Back brace   Activity Tolerance Patient tolerated treatment well   Patient Left with family/visitor present (up with PT )   Nurse Communication Other (comment) (HHOT)        Time: 1173-5670 OT Time Calculation (min): 17 min  Charges: OT General Charges $OT Visit: 1 Procedure OT Treatments $Self Care/Home Management : 8-22 mins  Benito Mccreedy OTR/L [phone] 06/07/2015, 12:19 PM

## 2015-06-07 NOTE — Progress Notes (Signed)
Physical Therapy Treatment Patient Details Name: [name] MRN: [id number] DOB: 01-14-1946 Today's Date: 06/07/2015    History of Present Illness 69 y.o. male who has been diagnosed with Parkinsonism in the past year. s/p L3-L4 bilateral laminotomies and foraminotomies, placement of Coflex.    PT Comments    Patient seen for follow up session with wife present for mobility and education re: safety. Patient performed stair negotiation with wife present. Patient with noted LOB during ambulation, spoke with wife about use of RW and addressed concerns related to spinal recovery in conjunction with APD condition. Patient remains at risk for falls given the PD, encouraged HHPT and use of device with supervision for mobility upon discharge.  Follow Up Recommendations  Home health PT, Supervision for mobility     Equipment Recommendations  None recommended by PT    Recommendations for Other Services       Precautions / Restrictions Precautions Precautions: Back;Fall Precaution Booklet Issued:  (one in room) Precaution Comments: pt able to recall 3/3 back precautions Required Braces or Orthoses: Spinal Brace Spinal Brace: Lumbar corset;Applied in sitting position (adjusted in standing) Restrictions Weight Bearing Restrictions: No    Mobility  Bed Mobility Overal bed mobility: Needs Assistance Bed Mobility: Rolling;Sidelying to Sit Rolling: Supervision Sidelying to sit: Supervision       General bed mobility comments: received in standing post OT session  Transfers Overall transfer level: Needs assistance   Transfers: Sit to/from Stand Sit to Stand: Min guard         General transfer comment: posterior lean noted  Ambulation/Gait Ambulation/Gait assistance: Min guard (one episode of LOB with min assist) Ambulation Distance (Feet): 200 Feet Assistive device: Rolling walker (2 wheeled) Gait Pattern/deviations: Step-through pattern Gait velocity: slower Gait  velocity interpretation: Below normal speed for age/gender General Gait Details: Generally steady until turns or change in surface then pt festinates mildly.   Stairs Stairs: Yes Stairs assistance: Min assist Stair Management: One rail Right Number of Stairs: 5 (3 trials of 5 steps with wife) General stair comments: Performed with wife present. Educated on safety, technique and positioning for guarding. Patient continues to demonstrate impulsivity with task.  Wheelchair Mobility    Modified Rankin (Stroke Patients Only)       Balance Overall balance assessment: History of Falls                                  Cognition Arousal/Alertness: Awake/alert Behavior During Therapy: Flat affect Overall Cognitive Status: Within Functional Limits for tasks assessed                      Exercises      General Comments General comments (skin integrity, edema, etc.): spoke with patient and wife at length regarding mobility concerns and safety.       Pertinent Vitals/Pain Pain Assessment: No/denies pain Pain Score:  (1?) Pain Location: back Pain Descriptors / Indicators: Tightness;Tiring Pain Intervention(s): Monitored during session    Home Living                      Prior Function            PT Goals (current goals can now be found in the care plan section) Acute Rehab PT Goals Patient Stated Goal: not stated PT Goal Formulation: With patient Time For Goal Achievement: 06/09/15 Potential to Achieve Goals: Good Progress towards PT  goals: Progressing toward goals    Frequency  Min 5X/week    PT Plan Current plan remains appropriate    Co-evaluation             End of Session Equipment Utilized During Treatment: Back brace Activity Tolerance: Patient tolerated treatment well Patient left: Other (comment);with call bell/phone within reach (sitting EOB, wife present)     Time: 1209-1223 PT Time Calculation (min) (ACUTE ONLY):  14 min  Charges:  $Gait Training: 8-22 mins                    G CodesDuncan Dull 06-26-15, 1:42 PM  Alben Deeds, [place] DPT  [phone]

## 2015-06-07 NOTE — Progress Notes (Signed)
Physical Therapy Treatment Patient Details Name: Fordyce Lepak MRN: [id number] DOB: Aug 07, 1946 Today's Date: 06/07/2015    History of Present Illness 69 y.o. male who has been diagnosed with Parkinsonism in the past year. s/p L3-L4 bilateral laminotomies and foraminotomies, placement of Coflex.    PT Comments    Patient seen for mobility, ambulated and performed stair negotiation but patient continues to demonstrate some deficits with safety and awareness at times. Will perform follow up session later this am when wife arrives.  Follow Up Recommendations  Home health PT     Equipment Recommendations  None recommended by PT    Recommendations for Other Services       Precautions / Restrictions Precautions Precautions: Back;Fall Precaution Booklet Issued:  (one in room) Precaution Comments: pt able to recall 3/3 back precautions Required Braces or Orthoses: Spinal Brace Spinal Brace: Lumbar corset;Applied in sitting position (adjusted in standing) Restrictions Weight Bearing Restrictions: No    Mobility  Bed Mobility Overal bed mobility: Needs Assistance Bed Mobility: Rolling;Sidelying to Sit Rolling: Supervision Sidelying to sit: Supervision       General bed mobility comments: cues for technique/verbal and tactile cues.  Transfers Overall transfer level: Needs assistance   Transfers: Sit to/from Stand Sit to Stand: Min guard            Ambulation/Gait Ambulation/Gait assistance: Min guard Ambulation Distance (Feet): 240 Feet Assistive device: Rolling walker (2 wheeled) Gait Pattern/deviations: Step-through pattern Gait velocity: slower Gait velocity interpretation: Below normal speed for age/gender General Gait Details: Generally steady until turns or change in surface then pt festinates mildly.   Stairs Stairs: Yes Stairs assistance: Min assist Stair Management: One rail Right;Step to pattern;Forwards;Sideways Number of Stairs: 6 General stair  comments: Min assist this am, patient impulsive, required manual assist for safety with posterior LOB and weakness in RLE noted.   Wheelchair Mobility    Modified Rankin (Stroke Patients Only)       Balance Overall balance assessment: History of Falls                                  Cognition Arousal/Alertness: Awake/alert Behavior During Therapy: Flat affect Overall Cognitive Status: Within Functional Limits for tasks assessed                      Exercises      General Comments        Pertinent Vitals/Pain Pain Assessment: 0-10 Pain Score:  (1?) Pain Location: back Pain Descriptors / Indicators: Tightness;Tiring Pain Intervention(s): Monitored during session    Home Living                      Prior Function            PT Goals (current goals can now be found in the care plan section) Acute Rehab PT Goals Patient Stated Goal: not stated PT Goal Formulation: With patient Time For Goal Achievement: 06/09/15 Potential to Achieve Goals: Good Progress towards PT goals: Progressing toward goals    Frequency  Min 5X/week    PT Plan      Co-evaluation             End of Session Equipment Utilized During Treatment: Back brace Activity Tolerance: Patient tolerated treatment well Patient left: with call bell/phone within reach;with chair alarm set     Time: 9323-5573 PT Time Calculation (min) (ACUTE ONLY):  18 min  Charges:  $Gait Training: 8-22 mins                    G CodesDuncan Dull 11-Jun-2015, 1:37 PM  Alben Deeds, [place] DPT  [phone]

## 2015-06-07 NOTE — Progress Notes (Signed)
Patient alert and oriented, mae's well, voiding adequate amount of urine, swallowing without difficulty, no c/o pain. Patient discharged home with family. Script and discharged instructions given to patient. Patient and family stated understanding of d/c instructions given and has an appointment with MD. 

## 2015-06-07 NOTE — Progress Notes (Signed)
Patient ID: [name], male   DOB: 1946-02-25, 69 y.o.   MRN: [id number] Doing well Ambulated with PT  Will need home health PT and OT

## 2015-06-07 NOTE — Anesthesia Postprocedure Evaluation (Signed)
  Anesthesia Post-op Note  Patient: [name]  Procedure(s) Performed: Procedure(s): Lumbar three-four Laminectomy/Foraminotomy with placement of Coflex (N/A)  Patient Location: PACU  Anesthesia Type:General  Level of Consciousness: awake  Airway and Oxygen Therapy: Patient Spontanous Breathing  Post-op Pain: mild  Post-op Assessment: Post-op Vital signs reviewed, Patient's Cardiovascular Status Stable, Respiratory Function Stable, Patent Airway, No signs of Nausea or vomiting and Pain level controlled LLE Motor Response: Responds to commands   RLE Motor Response: Responds to commands        Post-op Vital Signs: Reviewed and stable  Last Vitals:  Filed Vitals:   06/07/15 0400  BP: 141/64  Pulse: 59  Temp: 36.6 C  Resp: 16    Complications: No apparent anesthesia complications

## 2015-06-07 NOTE — Discharge Summary (Signed)
Physician Discharge Summary  Patient ID: [name] MRN: [id number] DOB/AGE: 03/11/1946 69 y.o.  Admit date: 06/06/2015 Discharge date: 06/07/2015  Admission Diagnoses: Lumbar spinal stenosis L3-L4, parkinsonism  Discharge Diagnoses: Lumbar spinal stenosis L3-L4, parkinsonism, postoperative urinary retention Active Problems:   Lumbar stenosis   Discharged Condition: good  Hospital Course: Patient was admitted to undergo surgery. He had postoperative urinary retention. This resolved after catheterization and starting of Flomax. Is discharged home.  Consults: None  Significant Diagnostic Studies: None  Treatments: surgery: Bilateral laminotomies and decompression L3-L4 placement of Coflex.  Discharge Exam: Blood pressure 124/61, pulse 54, temperature 98 F (36.7 C), temperature source Oral, resp. rate 18, height 5\' 10"  (1.778 m), weight 91.173 kg (201 lb), SpO2 99 %. Incision is clean and dry, motor function is intact in lower extremities.  Disposition: 01-Home or Self Care  Discharge Instructions    Ambulatory referral to Home Health    Complete by:  As directed   Please evaluate [name] for admission to [facility].  Disciplines requested: Physical Therapy  Services to provide: Strengthening Exercises and Other: Occupationl Therapy   Physician to follow patient's care (the person listed here will be responsible for signing ongoing orders): Referring Provider  Requested Start of Care Date: Tomorrow  I certify that this patient is under my care and that I, or a Nurse Practitioner or Physician's Assistant working with me, had a face-to-face encounter that meets the physician face-to-face requirements with patient on 06/07/15. The encounter with the patient was in whole, or in part for the following medical condition(s) which is the primary reason for home health care (List medical condition). Lumbar Stenosis, Parkinsonism  Special Instructions:  Gait training and home  safety eval  Does the patient have Medicare or Medicaid?:  Yes  The encounter with the patient was in whole, or in part, for the following medical condition, which is the primary reason for home health care:  parkinsonism, lumbar stenosis  Reason for Medically Necessary Home Health Services:   Therapy- Personnel officer, Public librarian Therapy- Therapeutic Exercises to Increase Strength and Endurance    My clinical findings support the need for the above services:   Unable to leave home safely without assistance and/or assistive device Unsafe ambulation due to balance issues    I certify that, based on my findings, the following services are medically necessary home health services:  Physical therapy  Further, I certify that my clinical findings support that this patient is homebound due to:   Unable to leave home safely without assistance Unsafe ambulation due to balance issues Ambulates short distances less than 300 feet       Call MD for:  redness, tenderness, or signs of infection (pain, swelling, redness, odor or green/yellow discharge around incision site)    Complete by:  As directed      Call MD for:  severe uncontrolled pain    Complete by:  As directed      Call MD for:  temperature >100.4    Complete by:  As directed      Diet - low sodium heart healthy    Complete by:  As directed      Discharge instructions    Complete by:  As directed   Okay to shower. Do not apply salves or appointments to incision. No heavy lifting with the upper extremities greater than 15 pounds. May resume driving when not requiring pain medication and patient feels comfortable with doing so.  Increase activity slowly    Complete by:  As directed             Medication List    TAKE these medications        ALEVE PO  Take 2 tablets by mouth daily as needed. For pain     aspirin 81 MG tablet  Take 81 mg by mouth daily.     BYSTOLIC 2.5 MG tablet  Generic drug:   nebivolol  TAKE 1 TABLET BY MOUTH EVERY DAY     fish oil-omega-3 fatty acids 1000 MG capsule  Take 1,200 mg by mouth daily. 2400 mg on Tuesday, Thursday, and saturday     fluticasone 50 MCG/ACT nasal spray  Commonly known as:  FLONASE  USE 1-2 SPRAYS IN EACH NOSTRIL EVERY DAY     GUAIFENESIN PO  Take 400 mg by mouth 3 (three) times daily as needed. Congestion     levothyroxine 50 MCG tablet  Commonly known as:  SYNTHROID, LEVOTHROID  TAKE 1 TABLET BY MOUTH EVERY DAY     loratadine 10 MG tablet  Commonly known as:  CLARITIN  Take 10 mg by mouth daily.     losartan 100 MG tablet  Commonly known as:  COZAAR  TAKE 1 TABLET BY MOUTH DAILY     magnesium 30 MG tablet  Take 400 mg by mouth 2 (two) times daily.     methocarbamol 500 MG tablet  Commonly known as:  ROBAXIN  Take 1 tablet (500 mg total) by mouth every 6 (six) hours as needed for muscle spasms.     multivitamin with minerals tablet  Take 1 tablet by mouth daily.     polyethylene glycol packet  Commonly known as:  MIRALAX / GLYCOLAX  Take 17 g by mouth daily as needed.     testosterone cypionate 200 MG/ML injection  Commonly known as:  DEPO-TESTOSTERONE  Inject 2 mLs (400 mg total) into the muscle every 21 ( twenty-one) days.     timolol 0.5 % ophthalmic solution  Commonly known as:  TIMOPTIC  Place 1 drop into both eyes 2 (two) times daily.     traMADol 50 MG tablet  Commonly known as:  ULTRAM  Take 100 mg by mouth every 8 (eight) hours as needed for moderate pain.     Vitamin D3 10000 UNITS capsule  Take 5,000 Units by mouth daily. Wing. ,HYQMVH,84696 weekly         Signed: Earleen Newport 06/07/2015, 1:48 PM

## 2015-06-08 DIAGNOSIS — E785 Hyperlipidemia, unspecified: Secondary | ICD-10-CM | POA: Diagnosis not present

## 2015-06-08 DIAGNOSIS — Z4789 Encounter for other orthopedic aftercare: Secondary | ICD-10-CM | POA: Diagnosis not present

## 2015-06-08 DIAGNOSIS — E039 Hypothyroidism, unspecified: Secondary | ICD-10-CM | POA: Diagnosis not present

## 2015-06-08 DIAGNOSIS — I10 Essential (primary) hypertension: Secondary | ICD-10-CM | POA: Diagnosis not present

## 2015-06-08 DIAGNOSIS — H409 Unspecified glaucoma: Secondary | ICD-10-CM | POA: Diagnosis not present

## 2015-06-08 DIAGNOSIS — Z7982 Long term (current) use of aspirin: Secondary | ICD-10-CM | POA: Diagnosis not present

## 2015-06-08 DIAGNOSIS — G20 Parkinson's disease: Secondary | ICD-10-CM | POA: Diagnosis not present

## 2015-06-08 DIAGNOSIS — Z9181 History of falling: Secondary | ICD-10-CM | POA: Diagnosis not present

## 2015-06-08 NOTE — Progress Notes (Signed)
Late Entry Addendum to Initial Evaluation    06/06/15 1716  PT Time Calculation  PT Start Time (ACUTE ONLY) 1650  PT Stop Time (ACUTE ONLY) 1715  PT Time Calculation (min) (ACUTE ONLY) 25 min  PT G-Codes **NOT FOR INPATIENT CLASS**  Functional Assessment Tool Used clinical judgement  Functional Limitation Mobility: Walking and moving around  Mobility: Walking and Moving Around Current Status (L5797) CI  Mobility: Walking and Moving Around Goal Status (K8206) CI  PT General Charges  $$ ACUTE PT VISIT 1 Procedure  PT Evaluation  $Initial PT Evaluation Tier I 1 Procedure  PT Treatments  $Gait Training 8-22 mins   06/08/2015  Donnella Sham, PT [phone]  (pager)

## 2015-06-09 DIAGNOSIS — E785 Hyperlipidemia, unspecified: Secondary | ICD-10-CM | POA: Diagnosis not present

## 2015-06-09 DIAGNOSIS — G20 Parkinson's disease: Secondary | ICD-10-CM | POA: Diagnosis not present

## 2015-06-09 DIAGNOSIS — Z4789 Encounter for other orthopedic aftercare: Secondary | ICD-10-CM | POA: Diagnosis not present

## 2015-06-09 DIAGNOSIS — I10 Essential (primary) hypertension: Secondary | ICD-10-CM | POA: Diagnosis not present

## 2015-06-09 DIAGNOSIS — H409 Unspecified glaucoma: Secondary | ICD-10-CM | POA: Diagnosis not present

## 2015-06-09 DIAGNOSIS — E039 Hypothyroidism, unspecified: Secondary | ICD-10-CM | POA: Diagnosis not present

## 2015-06-10 DIAGNOSIS — E785 Hyperlipidemia, unspecified: Secondary | ICD-10-CM | POA: Diagnosis not present

## 2015-06-10 DIAGNOSIS — E039 Hypothyroidism, unspecified: Secondary | ICD-10-CM | POA: Diagnosis not present

## 2015-06-10 DIAGNOSIS — Z4789 Encounter for other orthopedic aftercare: Secondary | ICD-10-CM | POA: Diagnosis not present

## 2015-06-10 DIAGNOSIS — I10 Essential (primary) hypertension: Secondary | ICD-10-CM | POA: Diagnosis not present

## 2015-06-10 DIAGNOSIS — G20 Parkinson's disease: Secondary | ICD-10-CM | POA: Diagnosis not present

## 2015-06-10 DIAGNOSIS — H409 Unspecified glaucoma: Secondary | ICD-10-CM | POA: Diagnosis not present

## 2015-06-13 DIAGNOSIS — E039 Hypothyroidism, unspecified: Secondary | ICD-10-CM | POA: Diagnosis not present

## 2015-06-13 DIAGNOSIS — G20 Parkinson's disease: Secondary | ICD-10-CM | POA: Diagnosis not present

## 2015-06-13 DIAGNOSIS — I10 Essential (primary) hypertension: Secondary | ICD-10-CM | POA: Diagnosis not present

## 2015-06-13 DIAGNOSIS — E785 Hyperlipidemia, unspecified: Secondary | ICD-10-CM | POA: Diagnosis not present

## 2015-06-13 DIAGNOSIS — H409 Unspecified glaucoma: Secondary | ICD-10-CM | POA: Diagnosis not present

## 2015-06-13 DIAGNOSIS — Z4789 Encounter for other orthopedic aftercare: Secondary | ICD-10-CM | POA: Diagnosis not present

## 2015-06-14 DIAGNOSIS — E039 Hypothyroidism, unspecified: Secondary | ICD-10-CM | POA: Diagnosis not present

## 2015-06-14 DIAGNOSIS — E785 Hyperlipidemia, unspecified: Secondary | ICD-10-CM | POA: Diagnosis not present

## 2015-06-14 DIAGNOSIS — I10 Essential (primary) hypertension: Secondary | ICD-10-CM | POA: Diagnosis not present

## 2015-06-14 DIAGNOSIS — G20 Parkinson's disease: Secondary | ICD-10-CM | POA: Diagnosis not present

## 2015-06-14 DIAGNOSIS — H409 Unspecified glaucoma: Secondary | ICD-10-CM | POA: Diagnosis not present

## 2015-06-14 DIAGNOSIS — Z4789 Encounter for other orthopedic aftercare: Secondary | ICD-10-CM | POA: Diagnosis not present

## 2015-06-15 ENCOUNTER — Ambulatory Visit (INDEPENDENT_AMBULATORY_CARE_PROVIDER_SITE_OTHER): Payer: Medicare Other | Admitting: Cardiology

## 2015-06-15 ENCOUNTER — Encounter: Payer: Self-pay | Admitting: Cardiology

## 2015-06-15 VITALS — BP 120/68 | HR 55 | Ht 70.0 in | Wt 200.6 lb

## 2015-06-15 DIAGNOSIS — R0609 Other forms of dyspnea: Secondary | ICD-10-CM | POA: Diagnosis not present

## 2015-06-15 DIAGNOSIS — I10 Essential (primary) hypertension: Secondary | ICD-10-CM

## 2015-06-15 DIAGNOSIS — I421 Obstructive hypertrophic cardiomyopathy: Secondary | ICD-10-CM

## 2015-06-15 NOTE — Patient Instructions (Signed)

## 2015-06-15 NOTE — Progress Notes (Signed)
Cardiology Office Note   Date:  06/15/2015   ID:  [name], DOB [birth year], MRN [id number]  PCP:  Nyoka Cowden, MD    Chief Complaint  Patient presents with  . DOE      History of Present Illness: [name] is a 69 y.o. male with a history of HTN, OSA, Parkinson's, hyperlipidemia, pre- diabetes, asymmetric septal hypertrophy c/w HOCM with no outflow tact gradientand hypothyroidism who presents today for followup of HTN, fatigue and SOB.  He now presents for followup. He denies any chest pain. He does not have SOB when walking in the house but the SOB when bending over has not changed. He is 1 week out from spinal stenosis surgery.  He denies any palpitations, dizziness or syncope.  He intermittently has some mild LE edema after being up for a while and improves with compression hose.    Past Medical History  Diagnosis Date  . HTN (hypertension)   . Glaucoma   . Allergic rhinitis   . Parkinsonism 01/16/2013  . Hyperlipidemia   . Thyroid disease     HYPOTHYROID  . Hypogonadism male   . Pre-diabetes   . Complication of anesthesia     Slow to awaken  . Dysphagia   . Hypothyroidism   . Constipation   . GERD (gastroesophageal reflux disease)     Decdreased, has decreased coffee intack.  . Arthritis   . DDD (degenerative disc disease), cervical   . Cancer     basal cell skin cancer  . OSA (obstructive sleep apnea)     Has had surgery x 2    Past Surgical History  Procedure Laterality Date  . Uvulopalatopharyngoplasty    . Lumbar disc surgery    . Cataract extraction    . I&d extremity Right 10/18/2014    Procedure: IRRIGATION AND DEBRIDEMENT RIGHT KNEE PREPATELLA BURSA INFECTION;  Surgeon: [name], MD;  Location: [facility];  Service: Orthopedics;  Laterality: Right;  . Tonsillectomy      and adenoids  . Nasal septum surgery    . Lumbar laminectomy with coflex 1 level N/A 06/06/2015    Procedure: Lumbar three-four  Laminectomy/Foraminotomy with placement of Coflex;  Surgeon: [name], MD;  Location: [facility];  Service: Neurosurgery;  Laterality: N/A;     Current Outpatient Prescriptions  Medication Sig Dispense Refill  . aspirin 81 MG tablet Take 81 mg by mouth daily.    Marland Kitchen BYSTOLIC 2.5 MG tablet TAKE 1 TABLET BY MOUTH EVERY DAY 30 tablet 9  . Cholecalciferol (VITAMIN D3) 10000 UNITS capsule Take 5,000 Units by mouth daily. Mon. ,[id number] weekly    . fluticasone (FLONASE) 50 MCG/ACT nasal spray USE 1-2 SPRAYS IN EACH NOSTRIL EVERY DAY 16 g 6  . levothyroxine (SYNTHROID, LEVOTHROID) 50 MCG tablet TAKE 1 TABLET BY MOUTH EVERY DAY 30 tablet 5  . loratadine (CLARITIN) 10 MG tablet Take 10 mg by mouth daily.    Marland Kitchen losartan (COZAAR) 100 MG tablet TAKE 1 TABLET BY MOUTH DAILY 30 tablet 5  . magnesium gluconate (MAGONATE) 500 MG tablet Take 500 mg by mouth as needed.    . Multiple Vitamins-Minerals (MULTIVITAMIN WITH MINERALS) tablet Take 1 tablet by mouth daily.      . Naproxen Sodium (ALEVE PO) Take 2 tablets by mouth daily as needed. For pain    . Omega-3 Fatty Acids (FISH OIL) 1000 MG CAPS  Take 1,200 mg by mouth daily. Pt takes 2400 mg on Tues, Thurs and Sat.    . polyethylene glycol (MIRALAX / GLYCOLAX) packet Take 17 g by mouth daily as needed.    . testosterone cypionate (DEPO-TESTOSTERONE) 200 MG/ML injection Inject 2 mLs (400 mg total) into the muscle every 21 ( twenty-one) days. 10 mL 5  . timolol (TIMOPTIC) 0.5 % ophthalmic solution Place 1 drop into both eyes 2 (two) times daily.   12   No current facility-administered medications for this visit.    Allergies:   Ambien; Atenolol; Lunesta; Oxazepam; Requip; and Ziac    Social History:  The patient  reports that he has never smoked. He has never used smokeless tobacco. He reports that he does not drink alcohol or use illicit drugs.   Family History:  The patient's family history includes COPD in his mother and sister; Cancer in his  father; Diabetes in his father; Emphysema in his mother; Heart attack in his sister; Heart disease in his mother and sister; Hypertension in his father, mother, and sister; Liver cancer in his father. There is no history of Stroke.    ROS:  Please see the history of present illness.   Otherwise, review of systems are positive for none.   All other systems are reviewed and negative.    PHYSICAL EXAM: VS:  BP 120/68 mmHg  Pulse 55  Ht 5\' 10"  (1.778 m)  Wt 200 lb 9.6 oz (90.992 kg)  BMI 28.78 kg/m2  SpO2 94% , BMI Body mass index is 28.78 kg/(m^2). GEN: Well nourished, well developed, in no acute distress HEENT: normal Neck: no JVD, carotid bruits, or masses Cardiac: RRR; no murmurs, rubs, or gallops,no edema  Respiratory:  clear to auscultation bilaterally, normal work of breathing GI: soft, nontender, nondistended, + BS MS: no deformity or atrophy Skin: warm and dry, no rash Neuro:  Strength and sensation are intact Psych: euthymic mood, full affect   EKG:  EKG is not ordered today.    Recent Labs: 09/28/2014: ALT 30 12/13/2014: Pro B Natriuretic peptide (BNP) 8.0 06/03/2015: BUN 19; Creatinine, Ser 0.79; Hemoglobin 18.0*; Platelets 191; Potassium 4.6; Sodium 137    Lipid Panel    Component Value Date/Time   CHOL 186 09/28/2014 1015   TRIG 143.0 09/28/2014 1015   HDL 33.30* 09/28/2014 1015   CHOLHDL 6 09/28/2014 1015   VLDL 28.6 09/28/2014 1015   LDLCALC 124* 09/28/2014 1015      Wt Readings from Last 3 Encounters:  06/15/15 200 lb 9.6 oz (90.992 kg)  06/06/15 201 lb (91.173 kg)  06/03/15 201 lb 8 oz (91.4 kg)    ASSESSMENT AND PLAN:  1. Hypertrophic cardiomyopathy: No LVOT obstruction by echocardiogram. Continue with low dose beta blocker.  2. Dyspnea: Resolved. Some of this may have been related to Parkinson's.  3. Hypertension: Controlled. Continue Losartan and Bystolic   Current medicines are reviewed at length with the patient today.  The patient does  not have concerns regarding medicines.  The following changes have been made:  no change  Labs/ tests ordered today: See above Assessment and Plan No orders of the defined types were placed in this encounter.     Disposition:   FU with me in 6 months  Signed, Sueanne Margarita, MD  06/15/2015 1:31 PM    [facility] [place], [place], [place]  [address] Phone: [phone]; Fax: [phone]

## 2015-06-16 ENCOUNTER — Ambulatory Visit: Payer: Medicare Other | Admitting: Internal Medicine

## 2015-06-16 DIAGNOSIS — E039 Hypothyroidism, unspecified: Secondary | ICD-10-CM | POA: Diagnosis not present

## 2015-06-16 DIAGNOSIS — Z4789 Encounter for other orthopedic aftercare: Secondary | ICD-10-CM | POA: Diagnosis not present

## 2015-06-16 DIAGNOSIS — H409 Unspecified glaucoma: Secondary | ICD-10-CM | POA: Diagnosis not present

## 2015-06-16 DIAGNOSIS — E785 Hyperlipidemia, unspecified: Secondary | ICD-10-CM | POA: Diagnosis not present

## 2015-06-16 DIAGNOSIS — I10 Essential (primary) hypertension: Secondary | ICD-10-CM | POA: Diagnosis not present

## 2015-06-16 DIAGNOSIS — G20 Parkinson's disease: Secondary | ICD-10-CM | POA: Diagnosis not present

## 2015-06-20 DIAGNOSIS — E785 Hyperlipidemia, unspecified: Secondary | ICD-10-CM | POA: Diagnosis not present

## 2015-06-20 DIAGNOSIS — Z4789 Encounter for other orthopedic aftercare: Secondary | ICD-10-CM | POA: Diagnosis not present

## 2015-06-20 DIAGNOSIS — I10 Essential (primary) hypertension: Secondary | ICD-10-CM | POA: Diagnosis not present

## 2015-06-20 DIAGNOSIS — E039 Hypothyroidism, unspecified: Secondary | ICD-10-CM | POA: Diagnosis not present

## 2015-06-20 DIAGNOSIS — G20 Parkinson's disease: Secondary | ICD-10-CM | POA: Diagnosis not present

## 2015-06-20 DIAGNOSIS — H409 Unspecified glaucoma: Secondary | ICD-10-CM | POA: Diagnosis not present

## 2015-06-22 DIAGNOSIS — Z961 Presence of intraocular lens: Secondary | ICD-10-CM | POA: Diagnosis not present

## 2015-06-22 DIAGNOSIS — G20 Parkinson's disease: Secondary | ICD-10-CM | POA: Diagnosis not present

## 2015-06-22 DIAGNOSIS — H4011X2 Primary open-angle glaucoma, moderate stage: Secondary | ICD-10-CM | POA: Diagnosis not present

## 2015-06-23 DIAGNOSIS — H409 Unspecified glaucoma: Secondary | ICD-10-CM | POA: Diagnosis not present

## 2015-06-23 DIAGNOSIS — Z4789 Encounter for other orthopedic aftercare: Secondary | ICD-10-CM | POA: Diagnosis not present

## 2015-06-23 DIAGNOSIS — I10 Essential (primary) hypertension: Secondary | ICD-10-CM | POA: Diagnosis not present

## 2015-06-23 DIAGNOSIS — E039 Hypothyroidism, unspecified: Secondary | ICD-10-CM | POA: Diagnosis not present

## 2015-06-23 DIAGNOSIS — E785 Hyperlipidemia, unspecified: Secondary | ICD-10-CM | POA: Diagnosis not present

## 2015-06-23 DIAGNOSIS — G20 Parkinson's disease: Secondary | ICD-10-CM | POA: Diagnosis not present

## 2015-06-24 DIAGNOSIS — H409 Unspecified glaucoma: Secondary | ICD-10-CM | POA: Diagnosis not present

## 2015-06-24 DIAGNOSIS — I10 Essential (primary) hypertension: Secondary | ICD-10-CM | POA: Diagnosis not present

## 2015-06-24 DIAGNOSIS — E039 Hypothyroidism, unspecified: Secondary | ICD-10-CM | POA: Diagnosis not present

## 2015-06-24 DIAGNOSIS — E785 Hyperlipidemia, unspecified: Secondary | ICD-10-CM | POA: Diagnosis not present

## 2015-06-24 DIAGNOSIS — Z4789 Encounter for other orthopedic aftercare: Secondary | ICD-10-CM | POA: Diagnosis not present

## 2015-06-24 DIAGNOSIS — G20 Parkinson's disease: Secondary | ICD-10-CM | POA: Diagnosis not present

## 2015-06-27 DIAGNOSIS — Z4789 Encounter for other orthopedic aftercare: Secondary | ICD-10-CM | POA: Diagnosis not present

## 2015-06-27 DIAGNOSIS — E785 Hyperlipidemia, unspecified: Secondary | ICD-10-CM | POA: Diagnosis not present

## 2015-06-27 DIAGNOSIS — G20 Parkinson's disease: Secondary | ICD-10-CM | POA: Diagnosis not present

## 2015-06-27 DIAGNOSIS — E039 Hypothyroidism, unspecified: Secondary | ICD-10-CM | POA: Diagnosis not present

## 2015-06-27 DIAGNOSIS — I10 Essential (primary) hypertension: Secondary | ICD-10-CM | POA: Diagnosis not present

## 2015-06-27 DIAGNOSIS — H409 Unspecified glaucoma: Secondary | ICD-10-CM | POA: Diagnosis not present

## 2015-06-30 ENCOUNTER — Other Ambulatory Visit: Payer: Self-pay

## 2015-06-30 DIAGNOSIS — I10 Essential (primary) hypertension: Secondary | ICD-10-CM | POA: Diagnosis not present

## 2015-06-30 DIAGNOSIS — G20 Parkinson's disease: Secondary | ICD-10-CM

## 2015-06-30 DIAGNOSIS — R1310 Dysphagia, unspecified: Secondary | ICD-10-CM

## 2015-06-30 DIAGNOSIS — Z4789 Encounter for other orthopedic aftercare: Secondary | ICD-10-CM | POA: Diagnosis not present

## 2015-06-30 DIAGNOSIS — H409 Unspecified glaucoma: Secondary | ICD-10-CM | POA: Diagnosis not present

## 2015-06-30 DIAGNOSIS — E039 Hypothyroidism, unspecified: Secondary | ICD-10-CM | POA: Diagnosis not present

## 2015-06-30 DIAGNOSIS — E785 Hyperlipidemia, unspecified: Secondary | ICD-10-CM | POA: Diagnosis not present

## 2015-07-04 ENCOUNTER — Telehealth: Payer: Self-pay

## 2015-07-04 ENCOUNTER — Other Ambulatory Visit (HOSPITAL_COMMUNITY): Payer: Self-pay | Admitting: Neurology

## 2015-07-04 DIAGNOSIS — Z4789 Encounter for other orthopedic aftercare: Secondary | ICD-10-CM | POA: Diagnosis not present

## 2015-07-04 DIAGNOSIS — G20 Parkinson's disease: Secondary | ICD-10-CM

## 2015-07-04 DIAGNOSIS — R1310 Dysphagia, unspecified: Secondary | ICD-10-CM

## 2015-07-04 DIAGNOSIS — I10 Essential (primary) hypertension: Secondary | ICD-10-CM | POA: Diagnosis not present

## 2015-07-04 DIAGNOSIS — E039 Hypothyroidism, unspecified: Secondary | ICD-10-CM | POA: Diagnosis not present

## 2015-07-04 DIAGNOSIS — E785 Hyperlipidemia, unspecified: Secondary | ICD-10-CM | POA: Diagnosis not present

## 2015-07-04 DIAGNOSIS — H409 Unspecified glaucoma: Secondary | ICD-10-CM | POA: Diagnosis not present

## 2015-07-04 NOTE — Telephone Encounter (Signed)
I spoke to wife to advise her that patient needs Barium swallow before ST. I had his appt at [facility] for Oct 5 at 1:00pm. Wife reports that she wants to put it off for now since patient just had back surgery and is in PT right now. I advised her to call Radiology to cancel and reschedule for an appt that works best for them. Wife voiced understanding and stated that she would.

## 2015-07-11 DIAGNOSIS — Z4789 Encounter for other orthopedic aftercare: Secondary | ICD-10-CM | POA: Diagnosis not present

## 2015-07-11 DIAGNOSIS — E785 Hyperlipidemia, unspecified: Secondary | ICD-10-CM | POA: Diagnosis not present

## 2015-07-11 DIAGNOSIS — I10 Essential (primary) hypertension: Secondary | ICD-10-CM | POA: Diagnosis not present

## 2015-07-11 DIAGNOSIS — G20 Parkinson's disease: Secondary | ICD-10-CM | POA: Diagnosis not present

## 2015-07-11 DIAGNOSIS — H409 Unspecified glaucoma: Secondary | ICD-10-CM | POA: Diagnosis not present

## 2015-07-11 DIAGNOSIS — E039 Hypothyroidism, unspecified: Secondary | ICD-10-CM | POA: Diagnosis not present

## 2015-07-13 ENCOUNTER — Ambulatory Visit (HOSPITAL_COMMUNITY)
Admission: RE | Admit: 2015-07-13 | Discharge: 2015-07-13 | Disposition: A | Discharge status: Home / Self Care | Payer: Medicare Other | Source: Ambulatory Visit | Attending: Neurology | Admitting: Neurology

## 2015-07-13 ENCOUNTER — Other Ambulatory Visit (HOSPITAL_COMMUNITY): Payer: Self-pay | Admitting: Neurology

## 2015-07-13 DIAGNOSIS — R1310 Dysphagia, unspecified: Secondary | ICD-10-CM | POA: Insufficient documentation

## 2015-07-13 DIAGNOSIS — T17320D Food in larynx causing asphyxiation, subsequent encounter: Secondary | ICD-10-CM | POA: Diagnosis not present

## 2015-07-13 DIAGNOSIS — T17320A Food in larynx causing asphyxiation, initial encounter: Secondary | ICD-10-CM | POA: Diagnosis not present

## 2015-07-13 DIAGNOSIS — G20 Parkinson's disease: Secondary | ICD-10-CM | POA: Insufficient documentation

## 2015-07-13 NOTE — Procedures (Signed)
Objective Swallowing Evaluation: Other (Comment)  Patient Details  Name: [name] MRN: [id number] Date of Birth: 1945/11/08  Today's Date: 07/13/2015 Time: SLP Start Time (ACUTE ONLY): 1325-SLP Stop Time (ACUTE ONLY): 1350 SLP Time Calculation (min) (ACUTE ONLY): 25 min  Past Medical History:  Past Medical History  Diagnosis Date  . HTN (hypertension)   . Glaucoma   . Allergic rhinitis   . Parkinsonism 01/16/2013  . Hyperlipidemia   . Thyroid disease     HYPOTHYROID  . Hypogonadism male   . Pre-diabetes   . Complication of anesthesia     Slow to awaken  . Dysphagia   . Hypothyroidism   . Constipation   . GERD (gastroesophageal reflux disease)     Decdreased, has decreased coffee intack.  . Arthritis   . DDD (degenerative disc disease), cervical   . Cancer     basal cell skin cancer  . OSA (obstructive sleep apnea)     Has had surgery x 2   Past Surgical History:  Past Surgical History  Procedure Laterality Date  . Uvulopalatopharyngoplasty    . Lumbar disc surgery    . Cataract extraction    . I&d extremity Right 10/18/2014    Procedure: IRRIGATION AND DEBRIDEMENT RIGHT KNEE PREPATELLA BURSA INFECTION;  Surgeon: [name], MD;  Location: [facility];  Service: Orthopedics;  Laterality: Right;  . Tonsillectomy      and adenoids  . Nasal septum surgery    . Lumbar laminectomy with coflex 1 level N/A 06/06/2015    Procedure: Lumbar three-four Laminectomy/Foraminotomy with placement of Coflex;  Surgeon: [name], MD;  Location: [facility];  Service: Neurosurgery;  Laterality: N/A;   HPI:  Other Pertinent Information: 69 yo male referred for OP MBS due to concerns pt may be aspirating.  PMH + for dysphagia, GERD, OSA, thyroid dx, Parkinsonism diagnosed approx 4 years ago, allergic rhinitis, glaucoma and HTN. Pt has previously reported difficulties swallowing bread and coughing with liquids prior to [place] 2014.  PSH + for uvulopalatopharyngoplasty, lumbar disc surgery,  cataract extraction.    Pt had seen OP SLP in 2013.    No Data Recorded  Assessment / Plan / Recommendation CHL IP CLINICAL IMPRESSIONS 07/13/2015  Therapy Diagnosis Moderate oral phase dysphagia;Mild pharyngeal phase dysphagia  Clinical Impression  Moderate oral and mild pharyngeal dysphagia resulting in decreased oral propulsive strength.  Impacts most notably with mixed consistency and solids resulting in oral residuals and piecemealing.    Pharyngeal swallow was timely but decreased laryngeal closure timing allowed penetration.  Pt with trace amount of SILENT aspiration of thin liquids that appear to mix with secretions.  He also demonstrated penetration of nectar to vocal cords due to decreased laryngeal closure.  He did clear trace aspirates with cued cough/throat clearing. Use of chin tuck did not improve swallow ability and will worsen oral transiting deficits.  Pharyngeal swallow was strong overall with only minimal residuals.    Spouse present and reports pt does not swallow masticated food before taking more po.  Advised pt to importance to initiate swallow precautions given progressive nature of his neurological disease.  Reviewed recommendation for medications to be liquid form, crushed if large or whole if small with pudding  - start and follow with liquids.    Although pt aspirated with thin, he has not had pulmonary infections and has maintained nutrition/hydration per pt/spouse.  Educated pt/spouse to mitigation strategies using live video and teach back.    Encouraged maintence  of cough/voice/diaphgram strength to help with clearance/tolerance of ongoing trace aspiration.   Recommend follow up with OP SLP for strengthening exercises to address laryngeal elevation/oral musculature contraction and for family/pt education. Thanks for this referral.       CHL IP TREATMENT RECOMMENDATION 07/13/2015  Treatment Recommendations F/u OP SLP     CHL IP DIET RECOMMENDATION 07/13/2015  SLP  Diet Recommendations Dysphagia 3 (Mech soft);Thin  Liquid Administration via Cup, straw  Medication Administration Crushed with puree, whole with pudding if small, start and follow with liquids  Compensations Slow rate;Small sips/bites;Clear throat intermittently;Other (Comment)cough intermittently, start intake with liquids  Postural Changes and/or Swallow Maneuvers Reflux precautions      CHL IP OTHER RECOMMENDATIONS 07/13/2015  Recommended Consults (None)  Oral Care Recommendations Oral care BID  Other Recommendations Clarify dietary restrictions     CHL IP FOLLOW UP RECOMMENDATIONS 10/29/2012  Follow up Recommendations Outpatient SLP         CHL IP REASON FOR REFERRAL 07/13/2015  Reason for Referral Objectively evaluate swallowing function     CHL IP ORAL PHASE 07/13/2015  Oral Phase Impaired      CHL IP PHARYNGEAL PHASE 07/13/2015  Pharyngeal Phase Impaired  Pharyngeal Comment cued dry swallows decrease residuals, pill lodged at vallecular space with delayed clearing into esophagus after multiple boluses of pudding and finally thin liquid      CHL IP CERVICAL ESOPHAGEAL PHASE 07/13/2015  Cervical Esophageal Phase WFL    CHL IP GO 07/13/2015  Functional Assessment Tool Used MBS, clinical judgement  Functional Limitations Swallowing  Swallow Current Status (J6967) CJ  Swallow Goal Status (E9381) [place] Discharge Status [phone]) Michaell Cowing, [facility] SLP [phone]

## 2015-07-15 ENCOUNTER — Ambulatory Visit (INDEPENDENT_AMBULATORY_CARE_PROVIDER_SITE_OTHER): Payer: Medicare Other | Admitting: *Deleted

## 2015-07-15 DIAGNOSIS — E291 Testicular hypofunction: Secondary | ICD-10-CM | POA: Diagnosis not present

## 2015-07-15 DIAGNOSIS — Z23 Encounter for immunization: Secondary | ICD-10-CM | POA: Diagnosis not present

## 2015-07-15 DIAGNOSIS — E349 Endocrine disorder, unspecified: Secondary | ICD-10-CM

## 2015-07-15 MED ORDER — TESTOSTERONE CYPIONATE 100 MG/ML IM SOLN
400.0000 mg | Freq: Once | INTRAMUSCULAR | Status: AC
  Administered 2015-07-15: 400 mg via INTRAMUSCULAR

## 2015-08-02 ENCOUNTER — Ambulatory Visit: Payer: Medicare Other | Admitting: Occupational Therapy

## 2015-08-02 ENCOUNTER — Ambulatory Visit: Payer: Medicare Other | Attending: Neurology | Admitting: Physical Therapy

## 2015-08-02 DIAGNOSIS — R2681 Unsteadiness on feet: Secondary | ICD-10-CM

## 2015-08-02 DIAGNOSIS — R269 Unspecified abnormalities of gait and mobility: Secondary | ICD-10-CM

## 2015-08-02 NOTE — Therapy (Signed)
[facility] [address] [place], [place], [address] Phone: [phone]   Fax:  [phone]  Patient Details  Name: [name] MRN: [id number] Date of Birth: 1946/10/01 Referring Provider:  Star Age, MD  Encounter Date: 08/02/2015   Occupational Therapy Parkinson's Disease Screen  Physical Performance Test item #2 (simulated eating):  15.47 sec  Physical Performance Test item #4 (donning/doffing jacket):  approx 45 secs, with minguard for balance    Box & Blocks Test:   RUE 50 blocks        LUE  56 blocks  Change in ability to perform ADLs/IADLs:  Yes pt has had increased falls and reports increased difficulty with washing his hair, shaving. Other Comments: Pt has had back surgery 06/06/15  Mobility Screen:  Pt has had 2 falls with use of RW.  Pt needed min A-CGA for sit>stand.  Pt now is ambulating with RW all the time.  Pt demo increased difficulty with turns/direction changes.   Pt would benefit from occupational therapy and physical therapy evaluation due to  decline in mobility, increased fall risk from when last screened by OT and PT 01/04/15.    **Above screen performed by Vianne Bulls, OTR/L and Theone Murdoch, OTR/L. Theone Murdoch, OTR/ L 4:47 pm [facility] 08/02/2015, 4:37 PM [facility] [address] [place], [place], [address] Phone: [phone]   Fax:  [place], OTR/L 08/02/2015 4:42 PM

## 2015-08-03 ENCOUNTER — Other Ambulatory Visit: Payer: Self-pay

## 2015-08-03 ENCOUNTER — Telehealth: Payer: Self-pay

## 2015-08-03 DIAGNOSIS — Z7409 Other reduced mobility: Secondary | ICD-10-CM

## 2015-08-03 DIAGNOSIS — Z789 Other specified health status: Secondary | ICD-10-CM

## 2015-08-03 DIAGNOSIS — R2689 Other abnormalities of gait and mobility: Secondary | ICD-10-CM

## 2015-08-03 DIAGNOSIS — R296 Repeated falls: Secondary | ICD-10-CM

## 2015-08-03 DIAGNOSIS — G20 Parkinson's disease: Secondary | ICD-10-CM

## 2015-08-03 NOTE — Telephone Encounter (Signed)
-----   Message from Star Age, MD sent at 08/02/2015  5:20 PM EDT ----- Please enter orders for neuro rehab, PT and OT for parkinsonism, increase in falls, decline in functional mobility and change in ADL performance.  thx sa ----- Message -----    From: Delton Prairie, OT    Sent: 08/02/2015   4:50 PM      To: Delton Prairie, OT, Hulda Marin, OT, #  Dr. Rexene Alberts, [name] was seen for PD screen today. Due to increased falls, decline in functional mobility and change in ADL performance, pt may benefit from PT/ OT evaluations. If you agree please make this referral Thanks, Vianne Bulls, OTR/L

## 2015-08-03 NOTE — Telephone Encounter (Signed)
Orders are in

## 2015-08-05 DIAGNOSIS — M542 Cervicalgia: Secondary | ICD-10-CM | POA: Diagnosis not present

## 2015-08-10 DIAGNOSIS — M542 Cervicalgia: Secondary | ICD-10-CM | POA: Diagnosis not present

## 2015-08-10 DIAGNOSIS — M5030 Other cervical disc degeneration, unspecified cervical region: Secondary | ICD-10-CM | POA: Diagnosis not present

## 2015-08-11 ENCOUNTER — Ambulatory Visit: Payer: Medicare Other | Attending: Neurology

## 2015-08-11 DIAGNOSIS — R2681 Unsteadiness on feet: Secondary | ICD-10-CM | POA: Diagnosis not present

## 2015-08-11 DIAGNOSIS — R4189 Other symptoms and signs involving cognitive functions and awareness: Secondary | ICD-10-CM | POA: Insufficient documentation

## 2015-08-11 DIAGNOSIS — R6889 Other general symptoms and signs: Secondary | ICD-10-CM | POA: Diagnosis not present

## 2015-08-11 DIAGNOSIS — R1312 Dysphagia, oropharyngeal phase: Secondary | ICD-10-CM | POA: Diagnosis not present

## 2015-08-11 DIAGNOSIS — R471 Dysarthria and anarthria: Secondary | ICD-10-CM | POA: Diagnosis not present

## 2015-08-11 DIAGNOSIS — R269 Unspecified abnormalities of gait and mobility: Secondary | ICD-10-CM | POA: Diagnosis not present

## 2015-08-11 DIAGNOSIS — R29898 Other symptoms and signs involving the musculoskeletal system: Secondary | ICD-10-CM | POA: Diagnosis not present

## 2015-08-11 DIAGNOSIS — R2689 Other abnormalities of gait and mobility: Secondary | ICD-10-CM | POA: Diagnosis not present

## 2015-08-11 DIAGNOSIS — R258 Other abnormal involuntary movements: Secondary | ICD-10-CM | POA: Insufficient documentation

## 2015-08-11 DIAGNOSIS — R279 Unspecified lack of coordination: Secondary | ICD-10-CM | POA: Diagnosis not present

## 2015-08-11 NOTE — Patient Instructions (Signed)
SWALLOWING EXERCISES Do these 6 of the 7 days per week until New Year's Day, then 2 times per week afterwards  1. Effortful Swallows - Squeeze hard with the muscles in your neck while you swallow your  saliva or a sip of water - Repeat 20 times, 2-3 times a day, and use whenever you eat or drink  2. Masako Swallow - swallow with your tongue sticking out - Stick tongue out past your teeth and gently bite tongue with your teeth - Swallow, while holding your tongue with your teeth - Repeat 20 times, 2-3 times a day *use a wet spoon if your mouth gets dry*  3. Pitch Raise - Repeat "he", once per second in as high of a pitch as you can - Repeat 20 times, 2-3 times a day  4. Shaker Exercise - head lift - Lie flat on your back in your bed or on a couch without pillows - Raise your head and look at your feet - KEEP YOUR SHOULDERS DOWN - HOLD FOR 45-60 SECONDS, then lower your head back down - Repeat 3 times, 2-3 times a day  5. Mendelsohn Maneuver - "half swallow" exercise - Start to swallow, and keep your Adam's apple up by squeezing hard with the muscles of the throat - Hold the squeeze for 5-7 seconds and then relax - Repeat 20 times, 2-3 times a day *use a wet spoon if your mouth gets dry*  6. Tongue Press - Press your entire tongue as hard as you can against the roof of your mouth for 3-5 seconds - Repeat 20 times, 2-3 times a day  7. Tongue Stretch/Teeth Clean - Move your tongue around the pocket between your gums and teeth, clockwise and then counter-clockwise - Repeat on the back side, clockwise and then counter-clockwise - Repeat 15-20 times, 2-3 times a day        8. Breath Hold - Say "HUH!" loudly, then hold your breath for 3 seconds at your voice box - Repeat 20 times, 2-3 times a day  9. Chin pushback - Open your mouth  - Place your fist UNDER your chin near your neck, and push back with your fist for 5 seconds - Repeat 10 times, 2-3 times a day  10.      Water hold  - put a 1/2 teaspoon of water in your mouth and hold it there 10 seconds then swallow HARD  - repeat 10 times, twice a day  11.      Towel hold under chin  - roll up a towel, put it under your chin, and hold it there with your chin for 45-60 seconds  - repeat 3 times  - hold the towel under your chin with your hands and press down hard 30 times

## 2015-08-11 NOTE — Therapy (Signed)
[facility] [address] [place], [place], [address] Phone: [phone]   Fax:  [phone]  Speech Language Pathology Evaluation  Patient Details  Name: [name] MRN: [id number] Date of Birth: Jun 14, 1946 No Data Recorded  Encounter Date: 08/11/2015      End of Session - 08/11/15 1510    Visit Number 1   Number of Visits 17   Date for SLP Re-Evaluation 10/10/15   SLP Start Time 71   SLP Stop Time  1445   SLP Time Calculation (min) 43 min   Activity Tolerance Patient tolerated treatment well      Past Medical History  Diagnosis Date  . HTN (hypertension)   . Glaucoma   . Allergic rhinitis   . Parkinsonism 01/16/2013  . Hyperlipidemia   . Thyroid disease     HYPOTHYROID  . Hypogonadism male   . Pre-diabetes   . Complication of anesthesia     Slow to awaken  . Dysphagia   . Hypothyroidism   . Constipation   . GERD (gastroesophageal reflux disease)     Decdreased, has decreased coffee intack.  . Arthritis   . DDD (degenerative disc disease), cervical   . Cancer     basal cell skin cancer  . OSA (obstructive sleep apnea)     Has had surgery x 2    Past Surgical History  Procedure Laterality Date  . Uvulopalatopharyngoplasty    . Lumbar disc surgery    . Cataract extraction    . I&d extremity Right 10/18/2014    Procedure: IRRIGATION AND DEBRIDEMENT RIGHT KNEE PREPATELLA BURSA INFECTION;  Surgeon: [name], MD;  Location: [facility];  Service: Orthopedics;  Laterality: Right;  . Tonsillectomy      and adenoids  . Nasal septum surgery    . Lumbar laminectomy with coflex 1 level N/A 06/06/2015    Procedure: Lumbar three-four Laminectomy/Foraminotomy with placement of Coflex;  Surgeon: [name], MD;  Location: [facility];  Service: Neurosurgery;  Laterality: N/A;    There were no vitals filed for this visit.  Visit Diagnosis: Oropharyngeal dysphagia                         Subjective Assessment - 08/11/15 1449    Symptoms/Limitations   Subjective Small bites was only precaution recalled from MBSS spontaneously. Wife helped to cue pt for one more (small sips), and total A needed for others. Wife knew all precautions.   Patient is accompained by: Family member  wife   Pain Assessment   Currently in Pain? No/denies         Prior Functional Status - 08/11/15 1500    Prior Functional Status   Cognitive/Linguistic Baseline Baseline deficits   Type of Home House    Lives With Spouse   Available Help at Discharge Family   Vocation Retired         General - 08/11/15 1503    General Information   Type of Study Modified Barium Swallowing Study   Previous Swallow Assessment See report dated 07-13-15. Dys III/thin recommended with precautions of small sips/bites, intermittent throat clear/reswallow, slow rate (finish swallowing bite before adding more PO), start intake with liquids. Wife states pt not adhering to DysIII diet - eating dry crusty things and he coughs with this. Pt also intermittently follows precautions.   Diet Prior to 07-13-15 modified barium swallow exam Regular;Thin liquids  Thin Liquid - 08/11/15 1506    Thin Liquid   Thin Liquid Within functional limits   Presentation Cup;Spoon   Other Comments witnessed pt with 1/2 teaspoon swallows of H2O, cup sips H2O without overt s/s aspiration     SLP spent time educating/re-educating pt and wife re: results of MBSS and EX:HBZJIRC precautions and diet recommendation, as pt could not tell SLP >1 precaution and wife reports pt not adhering to precautions and diet recommendation at home. Wife tells SLP she has to cue pt during meals and pt gets frustrated at times. SLP encouraged pt to swallow with effort at any time he has POs.  SLP then developed a home exercise program based upon the results of pt's last modified barium swallow (MBSS) exam 07-13-15, targeting exercises that would  pinpoint suspected weakness. SLP demonstrated each exercise and pt return demonstrated. SLP did not move on to next exercise until SLP satisfied with pt's performance. Wife present as well during this time to judge appropriate/not appropriate reps of each exercise.            SLP Education - 08/11/15 1459    Education provided Yes   Education Details HEP. swallow precautions   Person(s) Educated Patient;Spouse   Methods Explanation;Demonstration;Verbal cues   Comprehension Verbalized understanding;Returned demonstration;Verbal cues required;Need further instruction          SLP Short Term Goals - 08/11/15 1514    SLP SHORT TERM GOAL #1   Title pt will perform HEP with occasional min A   Time 4   Period Weeks   Status New   SLP SHORT TERM GOAL #2   Title pt will demo swallow precautions with POs to remain safe during meals with occasional min A   Time 4   Period Weeks   Status New          SLP Long Term Goals - 08/11/15 1515    SLP LONG TERM GOAL #1   Title pt will perform HEP to improve oropharyneal swallowing ability with rare min A   Time 8   Period Weeks   Status New   SLP LONG TERM GOAL #2   Title pt will follow swallow precautions with rare mod A   Time 8   Period Weeks   Status New   SLP LONG TERM GOAL #3   Title wife/caregiver will assist patient in completing HEP with modified independence (use of HEP form)   Time 8   Period Weeks   Status New          Plan - 08/11/15 1511    Clinical Impression Statement Pt presents with mod oral and mild pharyngeal dysphagia; see MBSS report under "notes" dated 07-13-15 for details. Pt's speech exhibited short rushes of speech decreasing intelligibility to approx 90%. Pt requires skilled ST in order to maximize safety with POs by adhering to precautions and by completing HEP correctly. Wife may need to cue pt for successful completion of HEP. Pt and SLP may work on speech intelligibility goals during this therapy  course, if pt desires.   Speech Therapy Frequency 2x / week   Duration --  8 weeks   Treatment/Interventions Aspiration precaution training;Pharyngeal strengthening exercises;Diet toleration management by SLP;Compensatory techniques;Environmental controls  speech treatments/interventions PRN   Potential to Achieve Goals Fair   Potential Considerations Severity of impairments;Cooperation/participation level   SLP Home Exercise Plan provided today   Consulted and Agree with Plan of Care Patient  G-Codes - 08/11/15 1501    Functional Assessment Tool Used MBSS report   Functional Limitations Swallowing   Swallow Current Status (G2952) At least 20 percent but less than 40 percent impaired, limited or restricted   Swallow Goal Status (W4132) At least 20 percent but less than 40 percent impaired, limited or restricted      Problem List Patient Active Problem List   Diagnosis Date Noted  . Lumbar stenosis 06/06/2015  . HOCM (hypertrophic obstructive cardiomyopathy) ([place]) 12/12/2014  . Infection of right prepatellar bursa 10/18/2014  . DOE (dyspnea on exertion) 09/06/2014  . Fatigue due to excessive exertion 09/06/2014  . Testosterone deficiency 11/12/2013  . Routine general medical examination at a health care facility 08/30/2013  . Mixed hyperlipidemia 08/30/2013  . Other abnormal glucose 08/30/2013  . 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 Parkinsonism ([place]) 01/16/2013  . INSOMNIA 06/06/2008  . GLAUCOMA 06/01/2008  . Essential hypertension 06/01/2008  . Seasonal and perennial allergic rhinitis 06/01/2008    [facility] , MS, CCC-SLP  08/11/2015, 3:17 PM  [facility] [address] [place], [place], [address] Phone: [phone]   Fax:  [phone]  Name: [name] MRN: [id number] Date of Birth: 15-Feb-1946

## 2015-08-12 ENCOUNTER — Ambulatory Visit (INDEPENDENT_AMBULATORY_CARE_PROVIDER_SITE_OTHER): Payer: Medicare Other | Admitting: *Deleted

## 2015-08-12 DIAGNOSIS — E291 Testicular hypofunction: Secondary | ICD-10-CM

## 2015-08-12 DIAGNOSIS — Z23 Encounter for immunization: Secondary | ICD-10-CM

## 2015-08-12 DIAGNOSIS — E349 Endocrine disorder, unspecified: Secondary | ICD-10-CM

## 2015-08-12 MED ORDER — TESTOSTERONE CYPIONATE 200 MG/ML IM SOLN
100.0000 mg | Freq: Once | INTRAMUSCULAR | Status: AC
  Administered 2015-08-12: 100 mg via INTRAMUSCULAR

## 2015-08-15 ENCOUNTER — Encounter: Payer: Self-pay | Admitting: Internal Medicine

## 2015-08-15 ENCOUNTER — Ambulatory Visit: Payer: Medicare Other

## 2015-08-15 DIAGNOSIS — R2689 Other abnormalities of gait and mobility: Secondary | ICD-10-CM | POA: Diagnosis not present

## 2015-08-15 DIAGNOSIS — R1312 Dysphagia, oropharyngeal phase: Secondary | ICD-10-CM

## 2015-08-15 DIAGNOSIS — R2681 Unsteadiness on feet: Secondary | ICD-10-CM | POA: Diagnosis not present

## 2015-08-15 DIAGNOSIS — R258 Other abnormal involuntary movements: Secondary | ICD-10-CM | POA: Diagnosis not present

## 2015-08-15 DIAGNOSIS — R269 Unspecified abnormalities of gait and mobility: Secondary | ICD-10-CM | POA: Diagnosis not present

## 2015-08-15 DIAGNOSIS — R6889 Other general symptoms and signs: Secondary | ICD-10-CM | POA: Diagnosis not present

## 2015-08-15 NOTE — Therapy (Signed)
[facility] [address] [place], [place], [address] Phone: [phone]   Fax:  [phone]  Speech Language Pathology Treatment  Patient Details  Name: [name] MRN: [id number] Date of Birth: 05/22/1946 No Data Recorded  Encounter Date: 08/15/2015      End of Session - 08/15/15 4097    Visit Number 2   Number of Visits 17   Date for SLP Re-Evaluation 10/10/15   SLP Start Time 0805   SLP Stop Time  0848   SLP Time Calculation (min) 43 min   Activity Tolerance Patient tolerated treatment well      Past Medical History  Diagnosis Date  . HTN (hypertension)   . Glaucoma   . Allergic rhinitis   . Parkinsonism 01/16/2013  . Hyperlipidemia   . Thyroid disease     HYPOTHYROID  . Hypogonadism male   . Pre-diabetes   . Complication of anesthesia     Slow to awaken  . Dysphagia   . Hypothyroidism   . Constipation   . GERD (gastroesophageal reflux disease)     Decdreased, has decreased coffee intack.  . Arthritis   . DDD (degenerative disc disease), cervical   . Cancer     basal cell skin cancer  . OSA (obstructive sleep apnea)     Has had surgery x 2    Past Surgical History  Procedure Laterality Date  . Uvulopalatopharyngoplasty    . Lumbar disc surgery    . Cataract extraction    . I&d extremity Right 10/18/2014    Procedure: IRRIGATION AND DEBRIDEMENT RIGHT KNEE PREPATELLA BURSA INFECTION;  Surgeon: [name], MD;  Location: [facility];  Service: Orthopedics;  Laterality: Right;  . Tonsillectomy      and adenoids  . Nasal septum surgery    . Lumbar laminectomy with coflex 1 level N/A 06/06/2015    Procedure: Lumbar three-four Laminectomy/Foraminotomy with placement of Coflex;  Surgeon: [name], MD;  Location: [facility];  Service: Neurosurgery;  Laterality: N/A;    There were no vitals filed for this visit.  Visit Diagnosis: Oropharyngeal dysphagia      Subjective Assessment -  08/15/15 0809    Subjective "I think I did them two times each day."   Patient is accompained by: Family member  wife - in hallway               ADULT SLP TREATMENT - 08/15/15 0810    General Information   Behavior/Cognition Alert;Pleasant mood;Cooperative   Treatment Provided   Treatment provided Dysphagia   Dysphagia Treatment   Patient observed directly with PO's Yes   Type of PO's observed Thin liquids   Pharyngeal Phase Signs & Symptoms Immediate cough  on 3/20 swallows - cued pt for ensuring smaller sips   Other treatment/comments Pt admits to SLP that he has difficulty with completing all reps of exericises and instead losing focus and count of rep number. SLP provided 3 options for pt -lap counter, pen/paper to count reps, or have wife count for him. Pt stated pen/paper would work best so SLP and pt constructed checklist that he could use. Pt req'd min-mod cues usually wiht HEP today (tongue protrusion for [place], hold count on Mendelsohn and water hold, breath hold for laryngeal adduction, short "ga"s).    Pain Assessment   Pain Assessment No/denies pain   Assessment / Recommendations / Plan   Plan Continue with current plan of care   Progression Toward Goals  Progression toward goals Progressing toward goals          SLP Education - 08/15/15 0928    Education Details HEP   Person(s) Educated Patient   Methods Explanation;Demonstration;Verbal cues   Comprehension Verbalized understanding;Returned demonstration;Need further instruction;Verbal cues required          SLP Short Term Goals - 08/15/15 0930    SLP SHORT TERM GOAL #1   Title pt will perform HEP with occasional min A   Time 4   Period Weeks   Status On-going   SLP SHORT TERM GOAL #2   Title pt will demo swallow precautions with POs to remain safe during meals with occasional min A   Time 4   Period Weeks   Status On-going          SLP Long Term Goals - 08/15/15 0931    SLP LONG TERM  GOAL #1   Title pt will perform HEP to improve oropharyneal swallowing ability with rare min A   Time 8   Period Weeks   Status On-going   SLP LONG TERM GOAL #2   Title pt will follow swallow precautions with rare mod A   Time 8   Period Weeks   Status On-going   SLP LONG TERM GOAL #3   Title wife/caregiver will assist patient in completing HEP with modified independence (use of HEP form)   Time 8   Period Weeks   Status On-going          Plan - 08/15/15 0929    Clinical Impression Statement Pt will need to likely receive help with HEP From wife if he is unable to recall correct procedure with exercises in the next 1-2 weeks. Treatment hindered slightly by pt's impulsivity. Skilled ST needed to assess cont success with HEP and with swallow precautions.   Speech Therapy Frequency 2x / week   Duration --  8 weeks   Treatment/Interventions Aspiration precaution training;Pharyngeal strengthening exercises;Diet toleration management by SLP;Compensatory techniques;Environmental controls   Potential to Achieve Goals Fair   Potential Considerations Severity of impairments;Cooperation/participation level        Problem List Patient Active Problem List   Diagnosis Date Noted  . Lumbar stenosis 06/06/2015  . HOCM (hypertrophic obstructive cardiomyopathy) ([facility]) 12/12/2014  . Infection of right prepatellar bursa 10/18/2014  . DOE (dyspnea on exertion) 09/06/2014  . Fatigue due to excessive exertion 09/06/2014  . Testosterone deficiency 11/12/2013  . Routine general medical examination at a health care facility 08/30/2013  . Mixed hyperlipidemia 08/30/2013  . Other abnormal glucose 08/30/2013  . 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 Parkinsonism ([place]) 01/16/2013  . INSOMNIA 06/06/2008  . GLAUCOMA 06/01/2008  . Essential hypertension 06/01/2008  . Seasonal and perennial allergic rhinitis 06/01/2008    [facility] , MS, CCC-SLP  08/15/2015, 9:32 AM  [facility] [address] [place], [place], [address] Phone: [phone]   Fax:  [phone]   Name: [name] MRN: [id number] Date of Birth: 1946-09-21

## 2015-08-15 NOTE — Patient Instructions (Signed)
Make sure to complete exercises twice a day. Use the grids we developed today to help you track number of reps

## 2015-08-19 ENCOUNTER — Ambulatory Visit: Payer: Medicare Other

## 2015-08-19 DIAGNOSIS — R1312 Dysphagia, oropharyngeal phase: Secondary | ICD-10-CM

## 2015-08-19 DIAGNOSIS — R2689 Other abnormalities of gait and mobility: Secondary | ICD-10-CM | POA: Diagnosis not present

## 2015-08-19 DIAGNOSIS — R2681 Unsteadiness on feet: Secondary | ICD-10-CM | POA: Diagnosis not present

## 2015-08-19 DIAGNOSIS — R4189 Other symptoms and signs involving cognitive functions and awareness: Secondary | ICD-10-CM

## 2015-08-19 DIAGNOSIS — R269 Unspecified abnormalities of gait and mobility: Secondary | ICD-10-CM | POA: Diagnosis not present

## 2015-08-19 DIAGNOSIS — R6889 Other general symptoms and signs: Secondary | ICD-10-CM | POA: Diagnosis not present

## 2015-08-19 DIAGNOSIS — R258 Other abnormal involuntary movements: Secondary | ICD-10-CM | POA: Diagnosis not present

## 2015-08-19 NOTE — Patient Instructions (Signed)
Continue with exercises as prescribed. Of the 20 rep exercise, it is ok to do the exercises throughout the day as long as you are getting in 30-40 reps. Others should be done as prescribed.

## 2015-08-19 NOTE — Therapy (Signed)
[facility] [address] [place], [place], [address] Phone: [phone]   Fax:  [phone]  Speech Language Pathology Treatment  Patient Details  Name: [name] MRN: [id number] Date of Birth: April 12, 1946 No Data Recorded  Encounter Date: 08/19/2015      End of Session - 08/19/15 1525    Visit Number 3   Number of Visits 17   Date for SLP Re-Evaluation 10/10/15   SLP Start Time [id number]   SLP Stop Time  1445   SLP Time Calculation (min) 42 min   Activity Tolerance Patient tolerated treatment well      Past Medical History  Diagnosis Date  . HTN (hypertension)   . Glaucoma   . Allergic rhinitis   . Parkinsonism 01/16/2013  . Hyperlipidemia   . Thyroid disease     HYPOTHYROID  . Hypogonadism male   . Pre-diabetes   . Complication of anesthesia     Slow to awaken  . Dysphagia   . Hypothyroidism   . Constipation   . GERD (gastroesophageal reflux disease)     Decdreased, has decreased coffee intack.  . Arthritis   . DDD (degenerative disc disease), cervical   . Cancer     basal cell skin cancer  . OSA (obstructive sleep apnea)     Has had surgery x 2    Past Surgical History  Procedure Laterality Date  . Uvulopalatopharyngoplasty    . Lumbar disc surgery    . Cataract extraction    . I&d extremity Right 10/18/2014    Procedure: IRRIGATION AND DEBRIDEMENT RIGHT KNEE PREPATELLA BURSA INFECTION;  Surgeon: [name], MD;  Location: [facility];  Service: Orthopedics;  Laterality: Right;  . Tonsillectomy      and adenoids  . Nasal septum surgery    . Lumbar laminectomy with coflex 1 level N/A 06/06/2015    Procedure: Lumbar three-four Laminectomy/Foraminotomy with placement of Coflex;  Surgeon: [name], MD;  Location: [facility];  Service: Neurosurgery;  Laterality: N/A;    There were no vitals filed for this visit.  Visit Diagnosis: Oropharyngeal dysphagia  Cognitive deficits       Subjective Assessment - 08/19/15 1410    Subjective "No pain. Just the pain of life."   Patient is accompained by: Family member  grand-daughter, [name]   Currently in Pain? No/denies               ADULT SLP TREATMENT - 08/19/15 1411    General Information   Behavior/Cognition Alert;Pleasant mood;Cooperative   Treatment Provided   Treatment provided Dysphagia   Dysphagia Treatment   Patient observed directly with PO's Yes   Type of PO's observed Thin liquids   Pharyngeal Phase Signs & Symptoms Immediate cough;Immediate throat clear   Other treatment/comments Pt with immediate cough on 1/10 swallows due to too large of a sip. Pt reports that happens at home, and SLP remraked that pt should be thankful for his wife who reminds him about things like that, because he needs reminders. Pt agreed  that wife's reminders may be necessary. HEP performed with usual min-mod cues.    Pain Assessment   Pain Assessment No/denies pain   Assessment / Recommendations / Plan   Plan Continue with current plan of care   Dysphagia Recommendations   Diet recommendations --  as rec on modified barium swallow exam 07-13-15   Progression Toward Goals   Progression toward goals Progressing toward goals  SLP Education - 08/19/15 1517    Education provided Yes   Education Details HEP   Person(s) Educated Patient;Caregiver(s)  grand-daughter Equities trader   Methods Explanation;Demonstration;Verbal cues   Comprehension Verbalized understanding;Returned demonstration;Need further instruction;Verbal cues required          SLP Short Term Goals - 08/19/15 1529    SLP SHORT TERM GOAL #1   Title pt will perform HEP with occasional min A   Time 4   Period Weeks   Status On-going   SLP SHORT TERM GOAL #2   Title pt will demo swallow precautions with POs to remain safe during meals with occasional min A   Time 4   Period Weeks   Status On-going          SLP Long Term Goals - 08/19/15  1529    SLP LONG TERM GOAL #1   Title pt will perform HEP to improve oropharyneal swallowing ability with rare min A   Time 8   Period Weeks   Status On-going   SLP LONG TERM GOAL #2   Title pt will follow swallow precautions with rare mod A   Time 8   Period Weeks   Status On-going   SLP LONG TERM GOAL #3   Title wife/caregiver will assist patient in completing HEP with modified independence (use of HEP form)   Time 8   Period Weeks   Status On-going          Plan - 08/19/15 1525    Clinical Impression Statement Pt cont to need skilled ST to assess correctness with HEP as well as assessing pt's adherence to precautions. Pt cont to demo impulsivity which hinders progress somewhat.   Speech Therapy Frequency 2x / week   Duration --  7 weeks   Treatment/Interventions Aspiration precaution training;Pharyngeal strengthening exercises;Diet toleration management by SLP;Compensatory techniques;Environmental controls   Potential to Achieve Goals Fair   Potential Considerations Severity of impairments;Cooperation/participation level        Problem List Patient Active Problem List   Diagnosis Date Noted  . Lumbar stenosis 06/06/2015  . HOCM (hypertrophic obstructive cardiomyopathy) ([facility]) 12/12/2014  . Infection of right prepatellar bursa 10/18/2014  . DOE (dyspnea on exertion) 09/06/2014  . Fatigue due to excessive exertion 09/06/2014  . Testosterone deficiency 11/12/2013  . Routine general medical examination at a health care facility 08/30/2013  . Mixed hyperlipidemia 08/30/2013  . Other abnormal glucose 08/30/2013  . 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 Parkinsonism ([facility]) 01/16/2013  . INSOMNIA 06/06/2008  . GLAUCOMA 06/01/2008  . Essential hypertension 06/01/2008  . Seasonal and perennial allergic rhinitis 06/01/2008    [facility] , MS, CCC-SLP  08/19/2015, 3:30 PM  [facility] [address] [place], [place], [address] Phone: [phone]   Fax:  [phone]   Name: [name] MRN: [id number] Date of Birth: 07/31/1946

## 2015-08-24 ENCOUNTER — Encounter: Payer: Self-pay | Admitting: Occupational Therapy

## 2015-08-24 ENCOUNTER — Ambulatory Visit: Payer: Medicare Other | Admitting: Physical Therapy

## 2015-08-24 ENCOUNTER — Ambulatory Visit: Payer: Medicare Other | Admitting: Occupational Therapy

## 2015-08-24 DIAGNOSIS — R279 Unspecified lack of coordination: Secondary | ICD-10-CM

## 2015-08-24 DIAGNOSIS — R269 Unspecified abnormalities of gait and mobility: Secondary | ICD-10-CM

## 2015-08-24 DIAGNOSIS — R258 Other abnormal involuntary movements: Secondary | ICD-10-CM

## 2015-08-24 DIAGNOSIS — R4189 Other symptoms and signs involving cognitive functions and awareness: Secondary | ICD-10-CM

## 2015-08-24 DIAGNOSIS — R2689 Other abnormalities of gait and mobility: Secondary | ICD-10-CM | POA: Diagnosis not present

## 2015-08-24 DIAGNOSIS — R2681 Unsteadiness on feet: Secondary | ICD-10-CM

## 2015-08-24 DIAGNOSIS — R6889 Other general symptoms and signs: Secondary | ICD-10-CM

## 2015-08-24 DIAGNOSIS — R278 Other lack of coordination: Secondary | ICD-10-CM

## 2015-08-24 DIAGNOSIS — R1312 Dysphagia, oropharyngeal phase: Secondary | ICD-10-CM | POA: Diagnosis not present

## 2015-08-24 DIAGNOSIS — R29898 Other symptoms and signs involving the musculoskeletal system: Secondary | ICD-10-CM

## 2015-08-25 NOTE — Therapy (Signed)
[facility] [address] [place], [place], [address] Phone: [phone]   Fax:  [phone]  Occupational Therapy Evaluation  Patient Details  Name: [name] MRN: [id number] Date of Birth: 1945/11/19 Referring Provider: Dr. Guadelupe Sabin  Encounter Date: 08/24/2015      OT End of Session - 08/25/15 2031    Visit Number 1   Number of Visits 17   Date for OT Re-Evaluation 10/23/15   Authorization Type Medicare primary, BCBS secondary--need G-code   Authorization - Visit Number 1   Authorization - Number of Visits 10   OT Start Time 1105   OT Stop Time 1145   OT Time Calculation (min) 40 min   Activity Tolerance Patient tolerated treatment well   Behavior During Therapy Impulsive;Flat affect      Past Medical History  Diagnosis Date  . HTN (hypertension)   . Glaucoma   . Allergic rhinitis   . Parkinsonism ([place]) 01/16/2013  . Hyperlipidemia   . Thyroid disease     HYPOTHYROID  . Hypogonadism male   . Pre-diabetes   . Complication of anesthesia     Slow to awaken  . Dysphagia   . Hypothyroidism   . Constipation   . GERD (gastroesophageal reflux disease)     Decdreased, has decreased coffee intack.  . Arthritis   . DDD (degenerative disc disease), cervical   . Cancer ([facility])     basal cell skin cancer  . OSA (obstructive sleep apnea)     Has had surgery x 2    Past Surgical History  Procedure Laterality Date  . Uvulopalatopharyngoplasty    . Lumbar disc surgery    . Cataract extraction    . I&d extremity Right 10/18/2014    Procedure: IRRIGATION AND DEBRIDEMENT RIGHT KNEE PREPATELLA BURSA INFECTION;  Surgeon: [name], MD;  Location: [facility];  Service: Orthopedics;  Laterality: Right;  . Tonsillectomy      and adenoids  . Nasal septum surgery    . Lumbar laminectomy with coflex 1 level N/A 06/06/2015    Procedure: Lumbar three-four Laminectomy/Foraminotomy with placement of Coflex;  Surgeon: [name], MD;  Location: [facility];  Service: Neurosurgery;  Laterality: N/A;    There were no vitals filed for this visit.  Visit Diagnosis:  Hypokinesia  Rigidity  Decreased coordination  Unsteadiness  Decreased functional mobility  Cognitive deficits      Subjective Assessment - 08/25/15 2158    Subjective  Pt reports that he wants to be able to write and wash hair   Patient Stated Goals Per pt/wife:  improve writing, brushing hair, picking up cups, brushing teeth, washing hair, pulling down shirt, cutting food.   Currently in Pain? No/denies           [facility] OT Assessment - 08/25/15 0001    Assessment   Diagnosis Parkinsonism    Referring Provider Dr. Guadelupe Sabin   Onset Date 06/06/15  back surgery   Prior Therapy home health after surgery, and in 10/2014 after knee surgery   Precautions   Precautions Fall  cleared of back precautions per pt/wife   Precaution Comments Tends to fall backward; difficulty with transfers, changing directions, freezes   swallowing precautions: small sips, small bites--see speech    Restrictions   Weight Bearing Restrictions No   Balance Screen   Has the patient fallen in the past 6 months Yes   How many times? multiple  2 since back surgery  Has the patient had a decrease in activity level because of a fear of falling?  Yes   Home  Environment   Family/patient expects to be discharged to: Private residence   Lives With Spouse   Prior Function   Level of Independence Needs assistance with gait;Needs assistance with transfers;Needs assistance with ADLs   Vocation Retired   ADL   Eating/Feeding Needs assist with cutting food  eats too fast, choking hazard   Eating/Feeding picks up cups in fingertips per wife (safety concern)   Grooming --  partially shaving, difficulty bushing teeth, deodrant,    Grooming details unable to brush hair   Upper Body Bathing Supervision/safety  min A with drying back, difficulty washing hair   Lower  Body Bathing Supervision/safety   Upper Body Dressing Supervision/safety;Set up;Needs assist for fasteners  occasional min A with pulling down shirt   Lower Body Dressing --  needs A for shoes/socks   Toilet Tranfer Supervision/safety   Toileting - Clothing Manipulation Modified independent   Toileting -  Hygiene Modified Independent   Tub/Shower Transfer Supervision/safety   Tub/Shower Transfer Method --  step over   Data processing manager tub bench;Grab bars  tub/shower combo   Transfers/Ambulation Related to ADL's supervision for car transfer, bed rail for bed mobility (needs cues at times)   IADL   Shopping --  wife performs, pt sits in the car and reads   [employer] --  wife performs   Meal Prep --  wife performs   Education officer, environmental on family or friends for transportation  has not driven since July, not supposed to drive   Medication Management Is not capable of dispensing or managing own medication  wife does eye drops, pt able to pick up pills once given   Financial Management --  wife performs   Mobility   Mobility Status Freezing;History of falls  needs supervision   Mobility Status Comments 2 falls since back surgery, difficulty with turns, sit>stand   Written Expression   Dominant Hand Left   Handwriting Severe micrographia;Not legible  per pt/wife   Vision - History   Visual History Glaucome  hx of cataracts surgery, decr peripheral vision   Vision Assessment   Ocular Range of Motion Within Functional Limits  with gross assessment   Tracking/Visual Pursuits --  appears WFL with gross assessment   Diplopia Assessment --  pt denies   Cognition   Overall Cognitive Status Impaired/Different from baseline   Area of Impairment Attention;Memory;Safety/judgement;Awareness;Problem solving   Current Attention Level Selective   Memory Decreased short-term memory;Decreased recall of precautions   Following Commands --    Safety/Judgement Decreased awareness of safety;Decreased awareness of deficits   Awareness Intellectual  impaired   Problem Solving Slow processing;Difficulty sequencing  movement   Bradyphrenia Yes   Behaviors Perseveration;Poor frustration tolerance;Lability;Impulsive   Observation/Other Assessments   Standing Functional Reach Test not tested due to safety concerns   Other Surveys  Select   Physical Performance Test   Yes   Simulated Eating Comments 14.81   Donning Doffing Jacket Comments needs A due to decr standing balance   Coordination   Coordination and Movement Description Pt with timing deficits and moves too quickly with hypokinesia, incr difficulty with timing full body movements   9 Hole Peg Test Right;Left   Right 9 Hole Peg Test 35.34   Left 9 Hole Peg Test 46.31 with min difficulty   Tone   Assessment Location Right Upper Extremity;Left Upper Extremity  ROM / Strength   AROM / PROM / Strength AROM   AROM   Overall AROM  Within functional limits for tasks performed   RUE Tone   RUE Tone --  mild   LUE Tone   LUE Tone Mild                           OT Short Term Goals - 08/25/15 2052    OT SHORT TERM GOAL #1   Title Pt will perform updated HEP with supervision/min cues.--check STGs 09/22/15   Time 4   Period Weeks   Status New   OT SHORT TERM GOAL #2   Title Pt will write name with at least 75% legibility.   Time 4   Period Weeks   Status New   OT SHORT TERM GOAL #3   Title Pt will report incr ease with washing hair.   Time 4   Period Weeks   Status New   OT SHORT TERM GOAL #4   Title Pt will pick up cup using big amplitude movements in 5/10 attempts.   Time 4   Period Weeks   Status New   OT SHORT TERM GOAL #5   Title Pt will report incr ease with brushing teeth.   Time 4   Period Weeks   Status New           OT Long Term Goals - 08/25/15 2141    OT LONG TERM GOAL #1   Title Pt will verbalize understanding of adaptive  strategies/AE for ADLs prn.--check LTGs 10/23/15   Time 8   Period Weeks   Status New   OT LONG TERM GOAL #2   Title Pt will be able to don shirt using big amplitude movement strategies.   Time 8   Period Weeks   Status New   OT LONG TERM GOAL #3   Title Pt will be able to write name and simple phrase with at least 90% legibility.   Time 8   Period Weeks   Status New   OT LONG TERM GOAL #4   Title Pt will pick up cup using big amplitude movements in 8/10 attempts.   Time 8   Period Weeks   Status New   OT LONG TERM GOAL #5   Title Pt will be able to brush hair with min A.   Time 8   Period Weeks   Status New   Long Term Additional Goals   Additional Long Term Goals Yes   OT LONG TERM GOAL #6   Title Pt will be able to cut food mod I using AE/stratgies prn.   Time 8   Period Weeks   Status New   OT LONG TERM GOAL #7   Title Pt will improve coordination for ADLs as shown by improving time on 9-hole peg test by at [place] with dominant LUE.   Baseline 46.31sec   Time 8   Period Weeks   Status New               Plan - 08/25/15 2033    Clinical Impression Statement Pt is a 69 y.o. male with diagnosis of Parkinsonism with Laminectomy/Foraminotomy 06/06/15 due to back pain.  Pt is familiar to this therapist and reports/demo significant decline in function since last discharged from occupational thearpy 09/2014 and screened for therapy needs 12/2014.  Pt presents today with decr balance/functional mobility for ADLs/fall risk, decr  UE functional use and ADL performance, decr coordination, difficulty with timing of movement, hypokinesia, rigidity, decr safety, and cognitive deficits.  Pt would benefit from occupational therapy to improve ADL performance (ease, safety, decr caregiver burden) and improve quality of life.   Pt will benefit from skilled therapeutic intervention in order to improve on the following deficits (Retired) Decreased cognition;Decreased mobility;Impaired  perceived functional ability;Impaired UE functional use;Decreased knowledge of use of DME;Decreased balance;Decreased knowledge of precautions;Impaired tone;Decreased safety awareness;Decreased coordination  hypokinesia, timing deficits with movement   Rehab Potential Good   OT Frequency 2x / week   OT Duration 8 weeks  +evaluation   OT Treatment/Interventions Self-care/ADL training;Cryotherapy;Electrical Stimulation;Moist Heat;Passive range of motion;Cognitive remediation/compensation;Fluidtherapy;DME and/or AE instruction;Therapeutic activities;Therapeutic exercises;Energy conservation;Neuromuscular education;Ultrasound;Manual Therapy;Splinting;Patient/family education;Therapist, nutritional;Therapeutic exercise  PWR! treatment techniques   Plan practice functional movements with focus on large amplitude and timing, stratgies for ADLs   Consulted and Agree with Plan of Care Patient;Family member/caregiver   Family Member Consulted wife          G-Codes - 08/31/2015 2047/01/15    Functional Assessment Tool Used needs 24hr supervision; unable to brush hair; unable to legibly write name; difficulty shaving, brushing teeth, washing hair; unable to cut food, assist for fasteners and pulling shirt down   Functional Limitation Self care   Self Care Current Status [id number]) At least 60 percent but less than 80 percent impaired, limited or restricted   Self Care Goal Status [id number]) At least 40 percent but less than 60 percent impaired, limited or restricted      Problem List Patient Active Problem List   Diagnosis Date Noted  . Lumbar stenosis 06/06/2015  . HOCM (hypertrophic obstructive cardiomyopathy) ([place]) 12/12/2014  . Infection of right prepatellar bursa 10/18/2014  . DOE (dyspnea on exertion) 09/06/2014  . Fatigue due to excessive exertion 09/06/2014  . Testosterone deficiency 11/12/2013  . Routine general medical examination at a health care facility 08/30/2013  . Mixed hyperlipidemia  08/30/2013  . Other abnormal glucose 08/30/2013  . 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 Parkinsonism ([place]) 01/16/2013  . INSOMNIA 06/06/2008  . GLAUCOMA 06/01/2008  . Essential hypertension 06/01/2008  . Seasonal and perennial allergic rhinitis 06/01/2008    [facility] 08/25/2015, 9:59 PM  [facility] [address] [place], [place], [address] Phone: [phone]   Fax:  [phone]  Name: Olamiposi Topp MRN: [id number] Date of Birth: [birth year]  Vianne Bulls, OTR/L 08/25/2015 9:59 PM

## 2015-08-28 NOTE — Therapy (Signed)
[facility] [address] [place], [place], [address] Phone: [phone]   Fax:  [phone]  Physical Therapy Evaluation  Patient Details  Name: [name] MRN: [id number] Date of Birth: 12/04/1945 Referring Provider: Rexene Alberts  Encounter Date: 08/24/2015      PT End of Session - 08/28/15 1614    Visit Number 1   Number of Visits 17   Date for PT Re-Evaluation 10/23/15   Authorization Type Medicare-G-code every 10th visit   PT Start Time 1018   PT Stop Time 1102   PT Time Calculation (min) 44 min   Equipment Utilized During Treatment Gait belt   Activity Tolerance Patient tolerated treatment well      Past Medical History  Diagnosis Date  . HTN (hypertension)   . Glaucoma   . Allergic rhinitis   . Parkinsonism ([place]) 01/16/2013  . Hyperlipidemia   . Thyroid disease     HYPOTHYROID  . Hypogonadism male   . Pre-diabetes   . Complication of anesthesia     Slow to awaken  . Dysphagia   . Hypothyroidism   . Constipation   . GERD (gastroesophageal reflux disease)     Decdreased, has decreased coffee intack.  . Arthritis   . DDD (degenerative disc disease), cervical   . Cancer ([place])     basal cell skin cancer  . OSA (obstructive sleep apnea)     Has had surgery x 2    Past Surgical History  Procedure Laterality Date  . Uvulopalatopharyngoplasty    . Lumbar disc surgery    . Cataract extraction    . I&d extremity Right 10/18/2014    Procedure: IRRIGATION AND DEBRIDEMENT RIGHT KNEE PREPATELLA BURSA INFECTION;  Surgeon: [name], MD;  Location: [facility];  Service: Orthopedics;  Laterality: Right;  . Tonsillectomy      and adenoids  . Nasal septum surgery    . Lumbar laminectomy with coflex 1 level N/A 06/06/2015    Procedure: Lumbar three-four Laminectomy/Foraminotomy with placement of Coflex;  Surgeon: [name], MD;  Location: [facility];  Service: Neurosurgery;  Laterality: N/A;    There were  no vitals filed for this visit.  Visit Diagnosis:  Abnormality of gait  Unsteadiness  Bradykinesia  Festinating gait      Subjective Assessment - 08/28/15 1607    Subjective Pt presents to OP PT today with his wife, with worsening balance.  He has been hospitalized in January for staph infection due to falls, and he had back surgery due to lumbar stenosis in August 2016.  He has had 2 falls since his back surgery; he is using the walker at all times.  His wife is providing constant supervision.   Patient is accompained by: Family member  Wife   Pertinent History Back surgery August 2016; spinal lesion C6 (on MRI-MD not planning to do further work-up)   Patient Stated Goals Pt's goal for therapy is to improve balance and walking   Currently in Pain? No/denies            [facility] PT Assessment - 08/28/15 1607    Assessment   Medical Diagnosis Parkinson's disease   Referring Provider [place]   Onset Date/Surgical Date --  back surgery June 06, 2015   Precautions   Precautions Fall  Extreme fall precautions, no further back precautions   Precaution Comments Tends to fall backward; difficulty with transfers, changing directions   Balance Screen   Has the patient fallen  in the past 6 months Yes   How many times? At least 6-10 falls/day prior to back surgery; 2 falls since back surgery   Has the patient had a decrease in activity level because of a fear of falling?  Yes   Is the patient reluctant to leave their home because of a fear of falling?  Yes   [place] Private residence   Living Arrangements Spouse/significant other   Available Help at Discharge Family   Type of [place] to enter   Entrance Stairs-Number of Steps 3   Entrance Stairs-Rails Right  uses cane on R; wife assist   Home Layout Two level;Able to live on main level with bedroom/bathroom   [place] - 2 wheels;Cane - single point   Prior Function    Level of [place] Needs assistance with gait;Needs assistance with transfers   Posture/Postural Control   Posture/Postural Control Postural limitations   Postural Limitations Rounded Shoulders   ROM / Strength   AROM / PROM / Strength Strength   Strength   Strength Assessment Site Hip;Knee;Ankle   Right/Left Hip Right;Left   Right Hip Flexion 4/5   Left Hip Flexion 4/5   Right/Left Knee Right;Left   Right Knee Flexion 3+/5   Right Knee Extension 4/5   Left Knee Flexion 3+/5   Left Knee Extension 4/5   Right/Left Ankle Right;Left   Right Ankle Dorsiflexion 3-/5   Left Ankle Dorsiflexion 3+/5   Transfers   Transfers Sit to Stand;Stand to Sit   Sit to Stand 5: Supervision;With upper extremity assist;From chair/3-in-1   Sit to Stand Details Verbal cues for sequencing;Verbal cues for technique   Sit to Stand Details (indicate cue type and reason) Wife nearby to provide cues for slowed pacing and proper technique of transfer   Five time sit to stand comments  unable without UE support   Stand to Sit 5: Supervision;With upper extremity assist;To chair/3-in-1   Comments Per wife report:  he locks knees and pushes back onto chair, and has tendency for strong posterior lean   Ambulation/Gait   Ambulation/Gait Yes   Ambulation/Gait Assistance 4: Min guard   Ambulation Distance (Feet) 120 Feet   Assistive device Rolling walker   Gait Pattern Step-through pattern;Decreased step length - right;Decreased step length - left;Decreased dorsiflexion - right;Festinating;Poor foot clearance - right;Poor foot clearance - left  Has tendency to freeze in doorways, crowded areas per wife   Ambulation Surface Indoor;Level   Gait velocity 14.39 sec = 2.28 ft/sec   Standardized Balance Assessment   Standardized Balance Assessment Timed Up and Go Test;Berg Balance Test   Berg Balance Test   Sit to Stand Able to stand  independently using hands   Standing Unsupported Able to stand 30 seconds  unsupported  after 1 minute, pt has posterior LOB, PT max A to regain   Sitting with Back Unsupported but Feet Supported on Floor or Stool Able to sit safely and securely 2 minutes   Stand to Sit Controls descent by using hands   Transfers Able to transfer with verbal cueing and /or supervision   Standing Unsupported with Eyes Closed Able to stand 10 seconds with supervision   Standing Ubsupported with Feet Together Needs help to attain position and unable to hold for 15 seconds  Strong posterior lean-max A to regain balance   From Standing, Reach Forward with Outstretched Arm Reaches forward but needs supervision   From Standing  Position, Pick up Object from Floor Unable to try/needs assist to keep balance   From Standing Position, Turn to Look Behind Over each Shoulder Needs assist to keep from losing balance and falling   Turn 360 Degrees Needs assistance while turning   Standing Unsupported, Alternately Place Feet on Step/Stool Needs assistance to keep from falling or unable to try   Standing Unsupported, One Foot in [place] help to step but can hold 15 seconds   Standing on One Leg Unable to try or needs assist to prevent fall   Total Score 19   Berg comment: Scores <45/56 indicate increased fall risk   Timed Up and Go Test   Normal TUG (seconds) 25.29                             PT Short Term Goals - 08/28/15 2143    PT SHORT TERM GOAL #1   Title Pt will perform HEP for Parkinson's-specific deficits with wife's supervision/assistance.  TARGET 09/22/15   Time 4   Period Weeks   Status New   PT SHORT TERM GOAL #2   Title Pt will perform at least 6 of 10 reps of sit<>stand transfers from 18-20 inch surfaces with UE support with no evidence of posterior lean, for improved safety and efficiency of transfers.   Time 4   Period Weeks   Status New   PT SHORT TERM GOAL #3   Title Pt will improve TUG score to less than or equal to 20 seconds for decreased fall  risk.   Time 4   Period Weeks   Status New   PT SHORT TERM GOAL #4   Title Pt/wife will verbalize understanding of techniques to decrease festinating/freezing of gait.   Time 4   Period Weeks   Status New           PT Long Term Goals - 08/28/15 2154    PT LONG TERM GOAL #1   Title Pt/wife will verbalize understanding of fall prevention within the home.  TARGET 10/23/15   Time 8   Period Weeks   Status New   PT LONG TERM GOAL #2   Title Pt will improve Berg Balance score to at least 28/56 for decreased fall risk.   Time 8   Period Weeks   Status New   PT LONG TERM GOAL #3   Title Pt will improve gait velocity to at least 2.62 ft/sec for improved gait efficiency and safety.   Time 8   Period Weeks   Status New   PT LONG TERM GOAL #4   Title Pt will perform floor>stand transfers with minimal assistance and UE support for safe fall recovery.   Time 8   Period Weeks   Status New   PT LONG TERM GOAL #5   Title Pt will verbalize plans for continued community fitness upon D/C from PT.   Time 8   Period Weeks   Status New               Plan - 08/28/15 1615    Clinical Impression Statement Pt is a 69 year old male who presents to OP PT with history of Parkinson's disease and multiple falls, who has had complicated medical course over the past year.  In January 2016, he was hospitalized for staph infection in R knee, and in August 2016 for back surgery to relieve back pain.  Pt no longer  is under back precautions; however, he has has 2 falls since back surgery.  He is currently using rolling walker and requires close supervision due to decreased balance, and festinating/freezing of gait.  Pt presents to OP PT with decreased timing and coordination of gait, festinating of gait, postural instability, history of falls.  Pt is at fall risk per Merrilee Jansky and  TUG scores.  Pt would benefit from skilled physical therapy to address the above stated deficits to improve functional mobility  and decrease fall risk.   Pt will benefit from skilled therapeutic intervention in order to improve on the following deficits Abnormal gait;Decreased balance;Decreased mobility;Decreased strength;Decreased safety awareness;Difficulty walking;Postural dysfunction   Rehab Potential Good   PT Frequency 2x / week   PT Duration 8 weeks  plus eval   PT Treatment/Interventions ADLs/Self Care Home Management;Therapeutic exercise;Therapeutic activities;Functional mobility training;Gait training;Balance training;Neuromuscular re-education;Patient/family education   PT Next Visit Plan Initiate HEP for sit<>stand transfers, for gait training, for balance activities; safety education with gait; tips to decrease festinating with gait.   Consulted and Agree with Plan of Care Patient;Family member/caregiver   Family Member Consulted wife         Problem List Patient Active Problem List   Diagnosis Date Noted  . Lumbar stenosis 06/06/2015  . HOCM (hypertrophic obstructive cardiomyopathy) ([place]) 12/12/2014  . Infection of right prepatellar bursa 10/18/2014  . DOE (dyspnea on exertion) 09/06/2014  . Fatigue due to excessive exertion 09/06/2014  . Testosterone deficiency 11/12/2013  . Routine general medical examination at a health care facility 08/30/2013  . Mixed hyperlipidemia 08/30/2013  . Other abnormal glucose 08/30/2013  . 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 Parkinsonism ([place]) 01/16/2013  . INSOMNIA 06/06/2008  . GLAUCOMA 06/01/2008  . Essential hypertension 06/01/2008  . Seasonal and perennial allergic rhinitis 06/01/2008    Doloris Servantes W. 08/28/2015, 9:59 PM  Frazier Butt., PT  [facility] [address] [place], [place], [address] Phone: [phone]   Fax:  [phone]  Name: [name] MRN: [id number] Date of Birth: [birth year]

## 2015-08-29 ENCOUNTER — Ambulatory Visit: Payer: Medicare Other | Admitting: Physical Therapy

## 2015-08-29 DIAGNOSIS — R1312 Dysphagia, oropharyngeal phase: Secondary | ICD-10-CM | POA: Diagnosis not present

## 2015-08-29 DIAGNOSIS — R269 Unspecified abnormalities of gait and mobility: Secondary | ICD-10-CM | POA: Diagnosis not present

## 2015-08-29 DIAGNOSIS — R2689 Other abnormalities of gait and mobility: Secondary | ICD-10-CM

## 2015-08-29 DIAGNOSIS — R258 Other abnormal involuntary movements: Secondary | ICD-10-CM | POA: Diagnosis not present

## 2015-08-29 DIAGNOSIS — R2681 Unsteadiness on feet: Secondary | ICD-10-CM | POA: Diagnosis not present

## 2015-08-29 DIAGNOSIS — R6889 Other general symptoms and signs: Secondary | ICD-10-CM | POA: Diagnosis not present

## 2015-08-29 NOTE — Therapy (Signed)
[facility] [address] [place], [place], [address] Phone: [phone]   Fax:  [phone]  Physical Therapy Treatment  Patient Details  Name: [name] MRN: [id number] Date of Birth: May 05, 1946 Referring Provider: Rexene Alberts  Encounter Date: 08/29/2015      PT End of Session - 08/29/15 1812    Visit Number 2   Number of Visits 17   Date for PT Re-Evaluation 10/23/15   Authorization Type Medicare-G-code every 10th visit   PT Start Time 0933   PT Stop Time 1015   PT Time Calculation (min) 42 min   Equipment Utilized During Treatment Gait belt   Activity Tolerance Patient tolerated treatment well   Behavior During Therapy [facility] for tasks assessed/performed      Past Medical History  Diagnosis Date  . HTN (hypertension)   . Glaucoma   . Allergic rhinitis   . Parkinsonism ([place]) 01/16/2013  . Hyperlipidemia   . Thyroid disease     HYPOTHYROID  . Hypogonadism male   . Pre-diabetes   . Complication of anesthesia     Slow to awaken  . Dysphagia   . Hypothyroidism   . Constipation   . GERD (gastroesophageal reflux disease)     Decdreased, has decreased coffee intack.  . Arthritis   . DDD (degenerative disc disease), cervical   . Cancer ([place])     basal cell skin cancer  . OSA (obstructive sleep apnea)     Has had surgery x 2    Past Surgical History  Procedure Laterality Date  . Uvulopalatopharyngoplasty    . Lumbar disc surgery    . Cataract extraction    . I&d extremity Right 10/18/2014    Procedure: IRRIGATION AND DEBRIDEMENT RIGHT KNEE PREPATELLA BURSA INFECTION;  Surgeon: [name], MD;  Location: [facility];  Service: Orthopedics;  Laterality: Right;  . Tonsillectomy      and adenoids  . Nasal septum surgery    . Lumbar laminectomy with coflex 1 level N/A 06/06/2015    Procedure: Lumbar three-four Laminectomy/Foraminotomy with placement of Coflex;  Surgeon: [name], MD;  Location: [facility];   Service: Neurosurgery;  Laterality: N/A;    There were no vitals filed for this visit.  Visit Diagnosis:  Abnormality of gait  Bradykinesia  Decreased functional mobility      Subjective Assessment - 08/29/15 0935    Subjective No falls, no changes since last visit   Pertinent History Back surgery August 2016; spinal lesion C6 (on MRI-MD not planning to do further work-up)   Patient Stated Goals Pt's goal for therapy is to improve balance and walking   Currently in Pain? No/denies                         [facility] Adult PT Treatment/Exercise - 08/29/15 0937    Transfers   Transfers Sit to Stand;Stand to Sit   Sit to Stand 5: Supervision;With upper extremity assist;From elevated surface;4: Min guard   Stand to Sit 5: Supervision;4: Min guard;With upper extremity assist;To elevated surface   Number of Reps 10 reps;Other sets (comment)  from 24", 22", then 20" 10 reps each   Comments Focus of repetitive transfers is proper technique, slowed pacing of transfers   Ambulation/Gait   Ambulation/Gait Yes   Ambulation/Gait Assistance 4: Min guard   Ambulation Distance (Feet) 120 Feet  3 reps   Assistive device Rolling walker   Gait Pattern Step-through pattern;Decreased  step length - right;Decreased step length - left;Decreased dorsiflexion - right;Festinating;Poor foot clearance - right;Poor foot clearance - left   Ambulation Surface Level;Indoor   High Level Balance   High Level Balance Comments At counter:  balance activities-lateral weigthshifting x 10 reps, then stagger stance forward/back weigthshifting x 10 reps each leg, then side step and weigthshift x 10 reps, back step and weightshift x 10 reps with UE support, min assist  Min guard/supervision for safety, bilateral UE support   Exercises   Exercises Knee/Hip   Knee/Hip Exercises: Aerobic   Stepper SciFit, seated stepper, Level 1.5 x 8 minutes, 4 extremities, keeping RPM >60-70                   PT Short Term Goals - 08/28/15 2143    PT SHORT TERM GOAL #1   Title Pt will perform HEP for Parkinson's-specific deficits with wife's supervision/assistance.  TARGET 09/22/15   Time 4   Period Weeks   Status New   PT SHORT TERM GOAL #2   Title Pt will perform at least 6 of 10 reps of sit<>stand transfers from 18-20 inch surfaces with UE support with no evidence of posterior lean, for improved safety and efficiency of transfers.   Time 4   Period Weeks   Status New   PT SHORT TERM GOAL #3   Title Pt will improve TUG score to less than or equal to 20 seconds for decreased fall risk.   Time 4   Period Weeks   Status New   PT SHORT TERM GOAL #4   Title Pt/wife will verbalize understanding of techniques to decrease festinating/freezing of gait.   Time 4   Period Weeks   Status New           PT Long Term Goals - 08/28/15 2154    PT LONG TERM GOAL #1   Title Pt/wife will verbalize understanding of fall prevention within the home.  TARGET 10/23/15   Time 8   Period Weeks   Status New   PT LONG TERM GOAL #2   Title Pt will improve Berg Balance score to at least 28/56 for decreased fall risk.   Time 8   Period Weeks   Status New   PT LONG TERM GOAL #3   Title Pt will improve gait velocity to at least 2.62 ft/sec for improved gait efficiency and safety.   Time 8   Period Weeks   Status New   PT LONG TERM GOAL #4   Title Pt will perform floor>stand transfers with minimal assistance and UE support for safe fall recovery.   Time 8   Period Weeks   Status New   PT LONG TERM GOAL #5   Title Pt will verbalize plans for continued community fitness upon D/C from PT.   Time 8   Period Weeks   Status New               Plan - 08/29/15 1813    Clinical Impression Statement Focus of therapy session today is slowed, pacing, safe technique with sit<>stand transfers and balance activities.  Pt needs very frequent verbal cues and close supervision/min guard assistance for  safety.  Pt will continue to benefit from further skilled PT to address balance, gait, transfers, and posture training for improved functional mobility.   Pt will benefit from skilled therapeutic intervention in order to improve on the following deficits Abnormal gait;Decreased balance;Decreased mobility;Decreased strength;Decreased safety awareness;Difficulty walking;Postural dysfunction  Rehab Potential Good   PT Frequency 2x / week   PT Duration 8 weeks  plus eval   PT Treatment/Interventions ADLs/Self Care Home Management;Therapeutic exercise;Therapeutic activities;Functional mobility training;Gait training;Balance training;Neuromuscular re-education;Patient/family education   PT Next Visit Plan Initiate HEP for sit<>stand transfers, for gait training, for balance activities; safety education with gait; tips to decrease festinating with gait. (Consider HEP for counter balance activities with weightshifting/stepping strategies)   Consulted and Agree with Plan of Care Patient   Family Member Consulted wife        Problem List Patient Active Problem List   Diagnosis Date Noted  . Lumbar stenosis 06/06/2015  . HOCM (hypertrophic obstructive cardiomyopathy) ([place]) 12/12/2014  . Infection of right prepatellar bursa 10/18/2014  . DOE (dyspnea on exertion) 09/06/2014  . Fatigue due to excessive exertion 09/06/2014  . Testosterone deficiency 11/12/2013  . Routine general medical examination at a health care facility 08/30/2013  . Mixed hyperlipidemia 08/30/2013  . Other abnormal glucose 08/30/2013  . 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 Parkinsonism ([place]) 01/16/2013  . INSOMNIA 06/06/2008  . GLAUCOMA 06/01/2008  . Essential hypertension 06/01/2008  . Seasonal and perennial allergic rhinitis 06/01/2008    Freddi Forster W. 08/29/2015, 6:21 PM  Frazier Butt., PT  [facility] [address] [place], [place],  [address] Phone: [phone]   Fax:  [phone]  Name: Yehoshua Napora MRN: [id number] Date of Birth: 09-27-1946

## 2015-09-06 ENCOUNTER — Ambulatory Visit: Payer: Medicare Other

## 2015-09-06 DIAGNOSIS — R2681 Unsteadiness on feet: Secondary | ICD-10-CM | POA: Diagnosis not present

## 2015-09-06 DIAGNOSIS — R1312 Dysphagia, oropharyngeal phase: Secondary | ICD-10-CM

## 2015-09-06 DIAGNOSIS — R258 Other abnormal involuntary movements: Secondary | ICD-10-CM | POA: Diagnosis not present

## 2015-09-06 DIAGNOSIS — R269 Unspecified abnormalities of gait and mobility: Secondary | ICD-10-CM | POA: Diagnosis not present

## 2015-09-06 DIAGNOSIS — R6889 Other general symptoms and signs: Secondary | ICD-10-CM | POA: Diagnosis not present

## 2015-09-06 DIAGNOSIS — R2689 Other abnormalities of gait and mobility: Secondary | ICD-10-CM | POA: Diagnosis not present

## 2015-09-07 ENCOUNTER — Ambulatory Visit: Payer: Medicare Other

## 2015-09-07 DIAGNOSIS — R1312 Dysphagia, oropharyngeal phase: Secondary | ICD-10-CM | POA: Diagnosis not present

## 2015-09-07 DIAGNOSIS — R258 Other abnormal involuntary movements: Secondary | ICD-10-CM | POA: Diagnosis not present

## 2015-09-07 DIAGNOSIS — R269 Unspecified abnormalities of gait and mobility: Secondary | ICD-10-CM | POA: Diagnosis not present

## 2015-09-07 DIAGNOSIS — R6889 Other general symptoms and signs: Secondary | ICD-10-CM | POA: Diagnosis not present

## 2015-09-07 DIAGNOSIS — R2689 Other abnormalities of gait and mobility: Secondary | ICD-10-CM | POA: Diagnosis not present

## 2015-09-07 DIAGNOSIS — R471 Dysarthria and anarthria: Secondary | ICD-10-CM

## 2015-09-07 DIAGNOSIS — R2681 Unsteadiness on feet: Secondary | ICD-10-CM | POA: Diagnosis not present

## 2015-09-07 NOTE — Therapy (Signed)
[facility] [address] [place], [place], [address] Phone: [phone]   Fax:  [phone]  Speech Language Pathology Treatment  Patient Details  Name: [name] MRN: [id number] Date of Birth: 1945-12-12 No Data Recorded  Encounter Date: 09/07/2015      End of Session - 09/07/15 1336    Visit Number 4   Number of Visits 17   Date for SLP Re-Evaluation 10/10/15   SLP Start Time 1149   SLP Stop Time  1231   SLP Time Calculation (min) 42 min   Activity Tolerance Patient tolerated treatment well      Past Medical History  Diagnosis Date  . HTN (hypertension)   . Glaucoma   . Allergic rhinitis   . Parkinsonism ([place]) 01/16/2013  . Hyperlipidemia   . Thyroid disease     HYPOTHYROID  . Hypogonadism male   . Pre-diabetes   . Complication of anesthesia     Slow to awaken  . Dysphagia   . Hypothyroidism   . Constipation   . GERD (gastroesophageal reflux disease)     Decdreased, has decreased coffee intack.  . Arthritis   . DDD (degenerative disc disease), cervical   . Cancer ([place])     basal cell skin cancer  . OSA (obstructive sleep apnea)     Has had surgery x 2    Past Surgical History  Procedure Laterality Date  . Uvulopalatopharyngoplasty    . Lumbar disc surgery    . Cataract extraction    . I&d extremity Right 10/18/2014    Procedure: IRRIGATION AND DEBRIDEMENT RIGHT KNEE PREPATELLA BURSA INFECTION;  Surgeon: [name], MD;  Location: [facility];  Service: Orthopedics;  Laterality: Right;  . Tonsillectomy      and adenoids  . Nasal septum surgery    . Lumbar laminectomy with coflex 1 level N/A 06/06/2015    Procedure: Lumbar three-four Laminectomy/Foraminotomy with placement of Coflex;  Surgeon: [name], MD;  Location: [facility];  Service: Neurosurgery;  Laterality: N/A;    There were no vitals filed for this visit.  Visit Diagnosis: Oropharyngeal dysphagia  Dysarthria       Subjective Assessment - 09/07/15 1153    Subjective Wife states pt coughs during meals.   Patient is accompained by: Family member  [name]               ADULT SLP TREATMENT - 09/07/15 1212    General Information   Behavior/Cognition Alert;Pleasant mood;Cooperative   Treatment Provided   Treatment provided Dysphagia   Dysphagia Treatment   Patient observed directly with PO's Yes   Type of PO's observed Thin liquids   Other treatment/comments SLp educatedre-eduicated pt and wife re: aspiration PNA and feeding tubes in order to reiterate to patient that he needed to adhere to precautions reminded by wife during meals.  Pt req'd min A occasionally with HEP today for proper completion. SLP suggested a family member observe pt to ensure correct completion. Cues needed x1 for smaller sips. Wife states consistent cues needed at home for pt to reduce eating rate. SLP explained some of pt's rate difficulties are due to symmptoms of pt's Parkinsonism.   Pain Assessment   Pain Assessment No/denies pain   Cognitive-Linquistic Treatment   Treatment focused on Dysarthria   Skilled Treatment SLP facilitated more intelligible speech today by pt repeating 5-9 word sentences with 90%  intelligible. When SLP handed sentences to pt, he remained intelligible for 3  sentences them reverted to imprecise consonants and omission of consonants and fast rate of speech and intelligibility decr'd considerably. SLP suggested pt work wit hsomeone at home to repeat 5 sentences then pt read 5 sentences with cues to slow rate PRN.   Assessment / Recommendations / Plan   Plan Continue with current plan of care   Dysphagia Recommendations   Diet recommendations Dysphagia 3 (mechanical soft);Thin liquid   Progression Toward Goals   Progression toward goals Progressing toward goals          SLP Education - 09/07/15 1335    Education provided Yes   Education Details fast rate symptom of Parkinsonism for pt,  need for family member to observe pt with HEP, need of "repeat sentences - state sentences" pattern   Person(s) Educated Patient;Spouse   Methods Explanation;Demonstration;Verbal cues   Comprehension Verbalized understanding;Returned demonstration;Verbal cues required;Need further instruction          SLP Short Term Goals - 09/07/15 [place] #1   Title pt will perform HEP with occasional min A   Period Weeks   Status Achieved   SLP SHORT TERM GOAL #2   Title pt will demo swallow precautions with POs to remain safe during meals with occasional min A   Period Weeks   Status On-going          SLP Long Term Goals - 09/07/15 1338    SLP LONG TERM GOAL #1   Title pt will perform HEP to improve oropharyneal swallowing ability with rare min A   Time 4   Period Weeks   Status On-going   SLP LONG TERM GOAL #2   Title pt will follow swallow precautions with rare mod A   Time 4   Period Weeks   Status On-going   SLP LONG TERM GOAL #3   Title wife/caregiver will assist patient in completing HEP with modified independence (use of HEP form)   Time 4   Period Weeks   Status On-going   SLP LONG TERM GOAL #4   Title pt will demo 75% intelligiblity with spontaneous sentence responses   Time 4   Period Weeks   Status New          Plan - 09/07/15 1337    Clinical Impression Statement Pt cont to need skilled ST to assess correctness with HEP as well as assessing pt's adherence to precautions. Pt cont to demo impulsivity which hinders progress somewhat. He will also be treated for dysarthria characterized by decr'd speech intelligilibty caused by severely rushed speech. Goal/s added.   Speech Therapy Frequency 2x / week   Duration 4 weeks   Treatment/Interventions Aspiration precaution training;Pharyngeal strengthening exercises;Diet toleration management by SLP;Compensatory techniques;Environmental controls   Potential to Achieve Goals Fair   Potential  Considerations Severity of impairments;Cooperation/participation level        Problem List Patient Active Problem List   Diagnosis Date Noted  . Lumbar stenosis 06/06/2015  . HOCM (hypertrophic obstructive cardiomyopathy) ([place]) 12/12/2014  . Infection of right prepatellar bursa 10/18/2014  . DOE (dyspnea on exertion) 09/06/2014  . Fatigue due to excessive exertion 09/06/2014  . Testosterone deficiency 11/12/2013  . Routine general medical examination at a health care facility 08/30/2013  . Mixed hyperlipidemia 08/30/2013  . Other abnormal glucose 08/30/2013  . 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 Parkinsonism ([place]) 01/16/2013  . INSOMNIA 06/06/2008  . GLAUCOMA 06/01/2008  . Essential hypertension 06/01/2008  . Seasonal and  perennial allergic rhinitis 06/01/2008    [facility] , MS, CCC-SLP  09/07/2015, 1:42 PM  [facility] [address] [place], [place], [address] Phone: [phone]   Fax:  [phone]   Name: [name] MRN: [id number] Date of Birth: April 17, 1946

## 2015-09-07 NOTE — Patient Instructions (Signed)
You must perform at least 30 reps of each exercise requiring 30-40 reps per day, in order to afford yourself the best possible avenue for improvement with muscle strength.

## 2015-09-07 NOTE — Therapy (Signed)
[facility] [address] [place], [place], [address] Phone: [phone]   Fax:  [phone]  Patient Details  Name: Attikus Rosendale MRN: [id number] Date of Birth: Dec 20, 1945 Referring Provider:  [name], MD  Encounter Date: 09/06/2015  Pt diagnosis: Dysphagia  Subjective: Pt reported he coughed over the Thanksgiving holiday.  O: Skilled Tx: Dysphagia: Pt is parsing out exercises throughout the day but only getting in equivalent of one set of exercises. Pt req'd min-mod A with HEP. SLP noted pt was very difficult to understand today while completing HEP  Pt education: You must perform at least 30 reps of each exercise requiring 30-40 reps per day, in order to afford yourself the best possible avenue for improvement with muscle strength.  A: Pt will need dysarthria goals added next session. Skilled ST needs to cont to maximize dysphagia and dysarthria skills.  P: cont x2/week for remainder of plan of care.  [facility] 09/07/2015, 5:33 PM  [facility] [address] [place], [place], [address] Phone: [phone]   Fax:  [phone]

## 2015-09-07 NOTE — Patient Instructions (Signed)
Have someone in the family observe you during your exercises. Repeat 5 sentences, then say 5 on your own - repeat this sequence for approx 35-40 sentences total each day.

## 2015-09-08 ENCOUNTER — Ambulatory Visit: Payer: Medicare Other | Admitting: Occupational Therapy

## 2015-09-08 ENCOUNTER — Ambulatory Visit: Payer: Medicare Other | Attending: Neurology | Admitting: Physical Therapy

## 2015-09-08 DIAGNOSIS — R29898 Other symptoms and signs involving the musculoskeletal system: Secondary | ICD-10-CM

## 2015-09-08 DIAGNOSIS — R258 Other abnormal involuntary movements: Secondary | ICD-10-CM

## 2015-09-08 DIAGNOSIS — R6889 Other general symptoms and signs: Secondary | ICD-10-CM

## 2015-09-08 DIAGNOSIS — R269 Unspecified abnormalities of gait and mobility: Secondary | ICD-10-CM

## 2015-09-08 DIAGNOSIS — R279 Unspecified lack of coordination: Secondary | ICD-10-CM

## 2015-09-08 DIAGNOSIS — R1312 Dysphagia, oropharyngeal phase: Secondary | ICD-10-CM | POA: Insufficient documentation

## 2015-09-08 DIAGNOSIS — R2681 Unsteadiness on feet: Secondary | ICD-10-CM | POA: Diagnosis not present

## 2015-09-08 DIAGNOSIS — R471 Dysarthria and anarthria: Secondary | ICD-10-CM | POA: Insufficient documentation

## 2015-09-08 DIAGNOSIS — R2689 Other abnormalities of gait and mobility: Secondary | ICD-10-CM

## 2015-09-08 DIAGNOSIS — R278 Other lack of coordination: Secondary | ICD-10-CM

## 2015-09-08 DIAGNOSIS — R4189 Other symptoms and signs involving cognitive functions and awareness: Secondary | ICD-10-CM | POA: Insufficient documentation

## 2015-09-08 NOTE — Therapy (Signed)
[facility] [address] [place], [place], [address] Phone: [phone]   Fax:  [phone]  Physical Therapy Treatment  Patient Details  Name: [name] MRN: [id number] Date of Birth: [birth year] Referring Provider: Rexene Alberts  Encounter Date: 09/08/2015      PT End of Session - 09/08/15 2206    Visit Number 3   Number of Visits 17   Date for PT Re-Evaluation 10/23/15   Authorization Type Medicare-G-code every 10th visit   PT Start Time 0848   PT Stop Time 0930   PT Time Calculation (min) 42 min   Equipment Utilized During Treatment Gait belt   Activity Tolerance Patient tolerated treatment well   Behavior During Therapy [facility] for tasks assessed/performed      Past Medical History  Diagnosis Date  . HTN (hypertension)   . Glaucoma   . Allergic rhinitis   . Parkinsonism ([place]) 01/16/2013  . Hyperlipidemia   . Thyroid disease     HYPOTHYROID  . Hypogonadism male   . Pre-diabetes   . Complication of anesthesia     Slow to awaken  . Dysphagia   . Hypothyroidism   . Constipation   . GERD (gastroesophageal reflux disease)     Decdreased, has decreased coffee intack.  . Arthritis   . DDD (degenerative disc disease), cervical   . Cancer ([place])     basal cell skin cancer  . OSA (obstructive sleep apnea)     Has had surgery x 2    Past Surgical History  Procedure Laterality Date  . Uvulopalatopharyngoplasty    . Lumbar disc surgery    . Cataract extraction    . I&d extremity Right 10/18/2014    Procedure: IRRIGATION AND DEBRIDEMENT RIGHT KNEE PREPATELLA BURSA INFECTION;  Surgeon: [name], MD;  Location: [facility];  Service: Orthopedics;  Laterality: Right;  . Tonsillectomy      and adenoids  . Nasal septum surgery    . Lumbar laminectomy with coflex 1 level N/A 06/06/2015    Procedure: Lumbar three-four Laminectomy/Foraminotomy with placement of Coflex;  Surgeon: [name], MD;  Location: [facility];   Service: Neurosurgery;  Laterality: N/A;    There were no vitals filed for this visit.  Visit Diagnosis:  Abnormality of gait  Rigidity      Subjective Assessment - 09/08/15 0854    Subjective Reports no falls, no changes since last visit.  "Still looking for the magic pill"   Currently in Pain? No/denies                         [facility] Adult PT Treatment/Exercise - 09/08/15 0855    Transfers   Transfers Sit to Stand;Stand to Sit   Sit to Stand 4: Min guard;With upper extremity assist;From bed  cues for hand placement   Stand to Sit 4: Min guard;With upper extremity assist;To bed   Number of Reps --  8 reps throughout session   Comments Practiced sit<>stand from chair at table, with cues on safely pushing chair away from table.   Ambulation/Gait   Ambulation/Gait Yes   Ambulation/Gait Assistance 4: Min guard   Ambulation Distance (Feet) 180 Feet  3 reps, then 80 ft   Assistive device Other (Comment)  trial of U-step RW   Gait Pattern Step-through pattern;Decreased step length - right;Decreased step length - left;Decreased dorsiflexion - right;Festinating;Poor foot clearance - right;Poor foot clearance - left   Ambulation Surface Level;Indoor  Gait Comments to have good control of gait, thresholds, and turns during initial trial with U-step RW.  Pt's wife verbalizes reluctance of patient to use this type of walker.  PT explains potential benefit of U-step RW as well as plans to continue to use U-step with additional distractions and more real-life scenarios.   High Level Balance   High Level Balance Comments At counter, wide base of support, lateral weightshifting x 10 reps, then lateral weightshifting with leg lifts x 10 reps with cues for slowed pacing, then stagger stance forward/back weightshifting x 10 reps each, 2 sets, including instruction of these as HEP to pt and wife.    Standing at counter with bilateral UE support, with feet shoulder width apart, with  anterior/posterior weightshifting x 10 reps.      Self Care:  Pt and wife in disagreement over walker use-pt wants more freedom of movement and wife is concerned for fall risk.  Explained to patient and wife we are ultimately looking at safest options for gait assistance, and U-step RW could be an option.  Discussed potential benefits of this type of walker, and need to continue to trial during therapy sessions.      PT Education - 09/08/15 2205    Education provided Yes   Education Details HEP-see instructions; potential benefits of U-step RW   Person(s) Educated Patient;Spouse   Methods Explanation;Demonstration;Handout   Comprehension Verbalized understanding;Returned demonstration;Verbal cues required;Need further instruction          PT Short Term Goals - 08/28/15 2143    PT SHORT TERM GOAL #1   Title Pt will perform HEP for Parkinson's-specific deficits with wife's supervision/assistance.  TARGET 09/22/15   Time 4   Period Weeks   Status New   PT SHORT TERM GOAL #2   Title Pt will perform at least 6 of 10 reps of sit<>stand transfers from 18-20 inch surfaces with UE support with no evidence of posterior lean, for improved safety and efficiency of transfers.   Time 4   Period Weeks   Status New   PT SHORT TERM GOAL #3   Title Pt will improve TUG score to less than or equal to 20 seconds for decreased fall risk.   Time 4   Period Weeks   Status New   PT SHORT TERM GOAL #4   Title Pt/wife will verbalize understanding of techniques to decrease festinating/freezing of gait.   Time 4   Period Weeks   Status New           PT Long Term Goals - 08/28/15 2154    PT LONG TERM GOAL #1   Title Pt/wife will verbalize understanding of fall prevention within the home.  TARGET 10/23/15   Time 8   Period Weeks   Status New   PT LONG TERM GOAL #2   Title Pt will improve Berg Balance score to at least 28/56 for decreased fall risk.   Time 8   Period Weeks   Status New   PT  LONG TERM GOAL #3   Title Pt will improve gait velocity to at least 2.62 ft/sec for improved gait efficiency and safety.   Time 8   Period Weeks   Status New   PT LONG TERM GOAL #4   Title Pt will perform floor>stand transfers with minimal assistance and UE support for safe fall recovery.   Time 8   Period Weeks   Status New   PT LONG TERM GOAL #5  Title Pt will verbalize plans for continued community fitness upon D/C from PT.   Time 8   Period Weeks   Status New               Plan - 09/08/15 2208    Clinical Impression Statement Pt seems to have improved control of U-step RW today and improved negotiation of tile>carepeted surface, and improved ability for turning.  Pt's wife hesitant of use of this type of walker, but agrees to continue to allow progression of dual tasking and complexity of tasks using U-step RW, simulating more real-life tasks.  Pt will continue to benefit from further skilled PT to address gait, balance, and transfers.   Pt will benefit from skilled therapeutic intervention in order to improve on the following deficits Abnormal gait;Decreased balance;Decreased mobility;Decreased strength;Decreased safety awareness;Difficulty walking;Postural dysfunction   Rehab Potential Good   PT Frequency 2x / week   PT Duration 8 weeks  plus eval   PT Treatment/Interventions ADLs/Self Care Home Management;Therapeutic exercise;Therapeutic activities;Functional mobility training;Gait training;Balance training;Neuromuscular re-education;Patient/family education   PT Next Visit Plan Initiate HEP for sit<>stand transfers, for gait training, for balance activities; safety education with gait; tips to decrease festinating with gait. Review HEP   Consulted and Agree with Plan of Care Patient;Family member/caregiver   Family Member Consulted wife        Problem List Patient Active Problem List   Diagnosis Date Noted  . Lumbar stenosis 06/06/2015  . HOCM (hypertrophic  obstructive cardiomyopathy) ([place]) 12/12/2014  . Infection of right prepatellar bursa 10/18/2014  . DOE (dyspnea on exertion) 09/06/2014  . Fatigue due to excessive exertion 09/06/2014  . Testosterone deficiency 11/12/2013  . Routine general medical examination at a health care facility 08/30/2013  . Mixed hyperlipidemia 08/30/2013  . Other abnormal glucose 08/30/2013  . 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 Parkinsonism ([place]) 01/16/2013  . INSOMNIA 06/06/2008  . GLAUCOMA 06/01/2008  . Essential hypertension 06/01/2008  . Seasonal and perennial allergic rhinitis 06/01/2008    MARRIOTT,AMY W. 09/08/2015, 10:12 PM  Frazier Butt., PT  [facility] [address] [place], [place], [address] Phone: [phone]   Fax:  [phone]  Name: [name] MRN: [id number] Date of Birth: 03-12-1946

## 2015-09-08 NOTE — Patient Instructions (Signed)
-  PWR! Based Handouts for standing lateral weightshifting exercise, x 10 reps each  -LSVT based handouts for stagger stance weigthshifting exercise, x 10 reps each position  -Hold onto counter for support -Perform these exercises slowly and controlled

## 2015-09-08 NOTE — Therapy (Signed)
[facility] [address] [place], [place], [address] Phone: [phone]   Fax:  [phone]  Occupational Therapy Treatment  Patient Details  Name: [name] MRN: [id number] Date of Birth: 07-Sep-1946 Referring Provider: Dr. Guadelupe Sabin  Encounter Date: 09/08/2015      OT End of Session - 09/08/15 1329    Visit Number 2   Number of Visits 17   Date for OT Re-Evaluation 10/23/15   Authorization Type Medicare primary, BCBS secondary--need G-code   Authorization - Visit Number 2   Authorization - Number of Visits 10   OT Start Time [phone]   OT Stop Time 0845   OT Time Calculation (min) 39 min   Activity Tolerance Patient tolerated treatment well   Behavior During Therapy [facility] for tasks assessed/performed      Past Medical History  Diagnosis Date  . HTN (hypertension)   . Glaucoma   . Allergic rhinitis   . Parkinsonism ([place]) 01/16/2013  . Hyperlipidemia   . Thyroid disease     HYPOTHYROID  . Hypogonadism male   . Pre-diabetes   . Complication of anesthesia     Slow to awaken  . Dysphagia   . Hypothyroidism   . Constipation   . GERD (gastroesophageal reflux disease)     Decdreased, has decreased coffee intack.  . Arthritis   . DDD (degenerative disc disease), cervical   . Cancer ([place])     basal cell skin cancer  . OSA (obstructive sleep apnea)     Has had surgery x 2    Past Surgical History  Procedure Laterality Date  . Uvulopalatopharyngoplasty    . Lumbar disc surgery    . Cataract extraction    . I&d extremity Right 10/18/2014    Procedure: IRRIGATION AND DEBRIDEMENT RIGHT KNEE PREPATELLA BURSA INFECTION;  Surgeon: [name], MD;  Location: [facility];  Service: Orthopedics;  Laterality: Right;  . Tonsillectomy      and adenoids  . Nasal septum surgery    . Lumbar laminectomy with coflex 1 level N/A 06/06/2015    Procedure: Lumbar three-four Laminectomy/Foraminotomy with placement of Coflex;   Surgeon: [name], MD;  Location: [place];  Service: Neurosurgery;  Laterality: N/A;    There were no vitals filed for this visit.  Visit Diagnosis:  Bradykinesia  Decreased functional mobility  Hypokinesia  Rigidity  Decreased coordination      Subjective Assessment - 09/08/15 1309    Subjective  Pt denies pain   Pertinent History see Epic   Patient Stated Goals Per pt/wife:  improve writing, brushing hair, picking up cups, brushing teeth, washing hair, pulling down shirt, cutting food.       Treatment:  Arm bike x 6 mins at beginning of session for conditioning, pt maintained 38-40 RPM. PWR up, rock, step, seated exercises 20 reps each, min v.c. and demonstration for speed/ timing as well as large amplitude movements(Therapist issued as HEP, pt can benefit from reinforcement) Fine motor coordination tasks: Flipping playing cards, dealing with thumb and picking up coins with mod v.c.for bigger movements, speed. Handwriting activities using pen with foam grip and min v.c. for speed and handwriting size. Pt demonstrates good legibility printing a sentence with min decrease in letter size.                          OT Short Term Goals - 08/25/15 2052    OT SHORT TERM  GOAL #1   Title Pt will perform updated HEP with supervision/min cues.--check STGs 09/22/15   Time 4   Period Weeks   Status New   OT SHORT TERM GOAL #2   Title Pt will write name with at least 75% legibility.   Time 4   Period Weeks   Status New   OT SHORT TERM GOAL #3   Title Pt will report incr ease with washing hair.   Time 4   Period Weeks   Status New   OT SHORT TERM GOAL #4   Title Pt will pick up cup using big amplitude movements in 5/10 attempts.   Time 4   Period Weeks   Status New   OT SHORT TERM GOAL #5   Title Pt will report incr ease with brushing teeth.   Time 4   Period Weeks   Status New           OT Long Term Goals - 08/25/15 2141    OT LONG TERM  GOAL #1   Title Pt will verbalize understanding of adaptive strategies/AE for ADLs prn.--check LTGs 10/23/15   Time 8   Period Weeks   Status New   OT LONG TERM GOAL #2   Title Pt will be able to don shirt using big amplitude movement strategies.   Time 8   Period Weeks   Status New   OT LONG TERM GOAL #3   Title Pt will be able to write name and simple phrase with at least 90% legibility.   Time 8   Period Weeks   Status New   OT LONG TERM GOAL #4   Title Pt will pick up cup using big amplitude movements in 8/10 attempts.   Time 8   Period Weeks   Status New   OT LONG TERM GOAL #5   Title Pt will be able to brush hair with min A.   Time 8   Period Weeks   Status New   Long Term Additional Goals   Additional Long Term Goals Yes   OT LONG TERM GOAL #6   Title Pt will be able to cut food mod I using AE/stratgies prn.   Time 8   Period Weeks   Status New   OT LONG TERM GOAL #7   Title Pt will improve coordination for ADLs as shown by improving time on 9-hole peg test by at [place] with dominant LUE.   Baseline 46.31sec   Time 8   Period Weeks   Status New               Plan - 09/08/15 1310    Clinical Impression Statement Pt is progressing towards goals with improved performance during exercises and coordination tasks following cues for timing to slow down.   Pt will benefit from skilled therapeutic intervention in order to improve on the following deficits (Retired) Decreased cognition;Decreased mobility;Impaired perceived functional ability;Impaired UE functional use;Decreased knowledge of use of DME;Decreased balance;Decreased knowledge of precautions;Impaired tone;Decreased safety awareness;Decreased coordination   Rehab Potential Good   OT Frequency 2x / week   OT Duration 8 weeks   OT Treatment/Interventions Self-care/ADL training;Cryotherapy;Electrical Stimulation;Moist Heat;Passive range of motion;Cognitive remediation/compensation;Fluidtherapy;DME and/or  AE instruction;Therapeutic activities;Therapeutic exercises;Energy conservation;Neuromuscular education;Ultrasound;Manual Therapy;Splinting;Patient/family education;Therapist, nutritional;Therapeutic exercise   Plan strategies for ADLS, timing for functional activities.   Consulted and Agree with Plan of Care Patient;Family member/caregiver        Problem List Patient Active Problem List  Diagnosis Date Noted  . Lumbar stenosis 06/06/2015  . HOCM (hypertrophic obstructive cardiomyopathy) ([place]) 12/12/2014  . Infection of right prepatellar bursa 10/18/2014  . DOE (dyspnea on exertion) 09/06/2014  . Fatigue due to excessive exertion 09/06/2014  . Testosterone deficiency 11/12/2013  . Routine general medical examination at a health care facility 08/30/2013  . Mixed hyperlipidemia 08/30/2013  . Other abnormal glucose 08/30/2013  . 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 Parkinsonism ([place]) 01/16/2013  . INSOMNIA 06/06/2008  . GLAUCOMA 06/01/2008  . Essential hypertension 06/01/2008  . Seasonal and perennial allergic rhinitis 06/01/2008    RINE,KATHRYN 09/08/2015, 1:30 PM Theone Murdoch, OTR/L [phone] Phone: [phone] 1:30 PM 09/08/2015 [facility] [address] [place], [place], [address] Phone: [phone]   Fax:  [phone]  Name: [name] MRN: [id number] Date of Birth: [birth year]

## 2015-09-12 ENCOUNTER — Ambulatory Visit: Payer: Medicare Other | Admitting: Occupational Therapy

## 2015-09-12 ENCOUNTER — Ambulatory Visit: Payer: Medicare Other

## 2015-09-12 DIAGNOSIS — R29898 Other symptoms and signs involving the musculoskeletal system: Secondary | ICD-10-CM | POA: Diagnosis not present

## 2015-09-12 DIAGNOSIS — R4189 Other symptoms and signs involving cognitive functions and awareness: Secondary | ICD-10-CM | POA: Diagnosis not present

## 2015-09-12 DIAGNOSIS — R1312 Dysphagia, oropharyngeal phase: Secondary | ICD-10-CM

## 2015-09-12 DIAGNOSIS — R278 Other lack of coordination: Secondary | ICD-10-CM

## 2015-09-12 DIAGNOSIS — R6889 Other general symptoms and signs: Secondary | ICD-10-CM

## 2015-09-12 DIAGNOSIS — R269 Unspecified abnormalities of gait and mobility: Secondary | ICD-10-CM | POA: Diagnosis not present

## 2015-09-12 DIAGNOSIS — R279 Unspecified lack of coordination: Secondary | ICD-10-CM

## 2015-09-12 DIAGNOSIS — R471 Dysarthria and anarthria: Secondary | ICD-10-CM

## 2015-09-12 DIAGNOSIS — R2689 Other abnormalities of gait and mobility: Secondary | ICD-10-CM | POA: Diagnosis not present

## 2015-09-12 NOTE — Therapy (Signed)
[facility] [address] [place], [place], [address] Phone: [phone]   Fax:  [phone]  Speech Language Pathology Treatment  Patient Details  Name: [name] MRN: [id number] Date of Birth: 1946-09-22 No Data Recorded  Encounter Date: 09/12/2015      End of Session - 09/12/15 1135    Visit Number 6   Number of Visits 17   Date for SLP Re-Evaluation 10/10/15   SLP Start Time 1104   SLP Stop Time  1145   SLP Time Calculation (min) 41 min   Activity Tolerance Patient tolerated treatment well      Past Medical History  Diagnosis Date  . HTN (hypertension)   . Glaucoma   . Allergic rhinitis   . Parkinsonism ([place]) 01/16/2013  . Hyperlipidemia   . Thyroid disease     HYPOTHYROID  . Hypogonadism male   . Pre-diabetes   . Complication of anesthesia     Slow to awaken  . Dysphagia   . Hypothyroidism   . Constipation   . GERD (gastroesophageal reflux disease)     Decdreased, has decreased coffee intack.  . Arthritis   . DDD (degenerative disc disease), cervical   . Cancer ([place])     basal cell skin cancer  . OSA (obstructive sleep apnea)     Has had surgery x 2    Past Surgical History  Procedure Laterality Date  . Uvulopalatopharyngoplasty    . Lumbar disc surgery    . Cataract extraction    . I&d extremity Right 10/18/2014    Procedure: IRRIGATION AND DEBRIDEMENT RIGHT KNEE PREPATELLA BURSA INFECTION;  Surgeon: [name], MD;  Location: [facility];  Service: Orthopedics;  Laterality: Right;  . Tonsillectomy      and adenoids  . Nasal septum surgery    . Lumbar laminectomy with coflex 1 level N/A 06/06/2015    Procedure: Lumbar three-four Laminectomy/Foraminotomy with placement of Coflex;  Surgeon: [name], MD;  Location: [facility];  Service: Neurosurgery;  Laterality: N/A;    There were no vitals filed for this visit.  Visit Diagnosis: Dysarthria  Oropharyngeal dysphagia       Subjective Assessment - 09/12/15 1107    Subjective Pt's wife states pt appears reluctant to complete HEP for swallowing.   Patient is accompained by: --  wife, [name]   Currently in Pain? No/denies               ADULT SLP TREATMENT - 09/12/15 1108    General Information   Behavior/Cognition Alert;Pleasant mood;Cooperative   Treatment Provided   Treatment provided Dysphagia   Dysphagia Treatment   Patient observed directly with PO's Yes   Type of PO's observed Thin liquids   Pharyngeal Phase Signs & Symptoms Immediate cough   Other treatment/comments SLP discussed with pt/wife re: best means for pt to practice swallowing HEP. Pt willing to accept wife's help for completion correctly (attending to rep number, and providing cues for procedure PRN). SLP facilitated correct compeltion of HEP by providing pt with min cues usually for swallowing HEP. Wife present. SLP reiterated to wife cues needed for pt on HEP and wife demo'd understanding.    Pain Assessment   Pain Assessment No/denies pain   Cognitive-Linquistic Treatment   Treatment focused on Dysarthria   Skilled Treatment SLP facilitated more intelligible speech today by telling meanings of figurative speech. Fast rate c/b ommision of consonants/syllalbes reduced tintelligibility. SLP suggested pt cont work at home to  assist in decreasing speech rate and improving intelligilbity.   Assessment / Recommendations / Plan   Plan Continue with current plan of care   Dysphagia Recommendations   Diet recommendations Dysphagia 3 (mechanical soft);Thin liquid   Compensations --  cont those suggested after modifed barium swallow exam   Progression Toward Goals   Progression toward goals Progressing toward goals          SLP Education - 09/12/15 1134    Education provided Yes   Education Details HEP - dysphagia   Person(s) Educated Patient;Spouse   Methods Explanation;Demonstration;Verbal cues   Comprehension Verbalized  understanding;Returned demonstration          SLP Short Term Goals - 09/12/15 1234    SLP SHORT TERM GOAL #1   Title pt will perform HEP with occasional min A   Period Weeks   Status Achieved   SLP SHORT TERM GOAL #2   Title pt will demo swallow precautions with POs to remain safe during meals with occasional min A   Time 1   Period Weeks   Status On-going          SLP Long Term Goals - 09/12/15 1234    SLP LONG TERM GOAL #1   Title pt will perform HEP to improve oropharyneal swallowing ability with rare min A   Time 5   Period Weeks   Status On-going   SLP LONG TERM GOAL #2   Title pt will follow swallow precautions with rare mod A   Time 5   Period Weeks   Status On-going   SLP LONG TERM GOAL #3   Title wife/caregiver will assist patient in completing HEP with modified independence (use of HEP form)   Time 5   Period Weeks   Status On-going   SLP LONG TERM GOAL #4   Title pt will demo 75% intelligiblity with spontaneous sentence responses   Time 5   Period Weeks   Status On-going          Plan - 09/12/15 1231    Clinical Impression Statement Pt cont to need skilled ST to assess correctness with HEP as well as assessing pt's adherence to swallow precautions. Pt cont to demo impulsivity which hinders progress somewhat. Pt also cont to benefit from skilled ST for rate reduction.   Speech Therapy Frequency 2x / week   Duration 4 weeks  3 weeks   Treatment/Interventions Aspiration precaution training;Pharyngeal strengthening exercises;Diet toleration management by SLP;Compensatory techniques;Environmental controls   Potential to Achieve Goals Fair   Potential Considerations Severity of impairments;Cooperation/participation level        Problem List Patient Active Problem List   Diagnosis Date Noted  . Lumbar stenosis 06/06/2015  . HOCM (hypertrophic obstructive cardiomyopathy) ([place]) 12/12/2014  . Infection of right prepatellar bursa 10/18/2014  . DOE  (dyspnea on exertion) 09/06/2014  . Fatigue due to excessive exertion 09/06/2014  . Testosterone deficiency 11/12/2013  . Routine general medical examination at a health care facility 08/30/2013  . Mixed hyperlipidemia 08/30/2013  . Other abnormal glucose 08/30/2013  . 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 Parkinsonism ([place]) 01/16/2013  . INSOMNIA 06/06/2008  . GLAUCOMA 06/01/2008  . Essential hypertension 06/01/2008  . Seasonal and perennial allergic rhinitis 06/01/2008    [facility] , MS, CCC-SLP  09/12/2015, 12:35 PM  [facility] [address] [place], [place], [address] Phone: [phone]   Fax:  [phone]   Name: Faruk Mcneil MRN: [id number] Date of  Birth: November 17, 1945

## 2015-09-12 NOTE — Patient Instructions (Signed)
  Please complete the assigned speech therapy homework and return it to your next session.  

## 2015-09-12 NOTE — Therapy (Signed)
[facility] [address] [place], [place], [address] Phone: [phone]   Fax:  [phone]  Occupational Therapy Treatment  Patient Details  Name: [name] MRN: [id number] Date of Birth: Feb 09, 1946 Referring Provider: Dr. Guadelupe Sabin  Encounter Date: 09/12/2015      OT End of Session - 09/12/15 1252    Visit Number 3   Number of Visits 17   Date for OT Re-Evaluation 10/23/15   Authorization Type Medicare primary, BCBS secondary--need G-code   Authorization - Visit Number 3   Authorization - Number of Visits 10   OT Start Time 1146   OT Stop Time 1230   OT Time Calculation (min) 44 min   Activity Tolerance Patient tolerated treatment well   Behavior During Therapy [facility] for tasks assessed/performed      Past Medical History  Diagnosis Date  . HTN (hypertension)   . Glaucoma   . Allergic rhinitis   . Parkinsonism ([place]) 01/16/2013  . Hyperlipidemia   . Thyroid disease     HYPOTHYROID  . Hypogonadism male   . Pre-diabetes   . Complication of anesthesia     Slow to awaken  . Dysphagia   . Hypothyroidism   . Constipation   . GERD (gastroesophageal reflux disease)     Decdreased, has decreased coffee intack.  . Arthritis   . DDD (degenerative disc disease), cervical   . Cancer ([place])     basal cell skin cancer  . OSA (obstructive sleep apnea)     Has had surgery x 2    Past Surgical History  Procedure Laterality Date  . Uvulopalatopharyngoplasty    . Lumbar disc surgery    . Cataract extraction    . I&d extremity Right 10/18/2014    Procedure: IRRIGATION AND DEBRIDEMENT RIGHT KNEE PREPATELLA BURSA INFECTION;  Surgeon: [name], MD;  Location: [facility];  Service: Orthopedics;  Laterality: Right;  . Tonsillectomy      and adenoids  . Nasal septum surgery    . Lumbar laminectomy with coflex 1 level N/A 06/06/2015    Procedure: Lumbar three-four Laminectomy/Foraminotomy with placement of Coflex;   Surgeon: [name], MD;  Location: [facility];  Service: Neurosurgery;  Laterality: N/A;    There were no vitals filed for this visit.  Visit Diagnosis:  Hypokinesia  Rigidity  Decreased coordination      Subjective Assessment - 09/12/15 1149    Subjective  Pt reports that he has done some exercises at home   Patient is accompained by: Family member   Pertinent History see Epic   Patient Stated Goals Per pt/wife:  improve writing, brushing hair, picking up cups, brushing teeth, washing hair, pulling down shirt, cutting food.   Currently in Pain? No/denies                      OT Treatments/Exercises (OP) - 09/12/15 0001    ADLs   Eating Practiced slow, large amplitude movements with grasp/release of cylinder object with min cueing (verbal, tactile, demo).  Improved performance with repetition/cueing   Bathing Simulated washing hair with mod cueing for slow, large amplitude movements.   Functional Mobility Transfer from one chair to another with close supervision; however, pt sit>stand impulsively with uncontrolled sit (to pull shirt down).  Cautioned pt to not stand without walker (RW was moved out of reach for exercise) and that he should verbalize that he needs it.  Pt verbalized understanding.  Then  sit>stand with min A due to LOB backwards.  Practiced sit>stand with forward lean with incr ease, safety.  Reinforced pt's need to use this technique.  Pt/wife verbalized understanding.   Writing Practiced signature, copying phone numbers, and signing checks with focus of slow, deliberate, letter formation and large size.  Min-mod cueing provided as pt wrote different contexts, but 100% legibility with min-mod decr in size.           [facility]) - 09/12/15 1255    PWR! exercises Moves in sitting   PWR! Up x15   PWR! Rock x10 each side   PWR! Step x10 each side   Comments with min cues for timing, big amplitude movements                OT Short Term  Goals - 09/12/15 1307    OT SHORT TERM GOAL #1   Title Pt will perform updated HEP with supervision/min cues.--check STGs 09/22/15   Time 4   Period Weeks   Status New   OT SHORT TERM GOAL #2   Title Pt will write name with at least 75% legibility.   Time 4   Period Weeks   Status New   OT SHORT TERM GOAL #3   Title Pt will report incr ease with washing hair.   Time 4   Period Weeks   Status New   OT SHORT TERM GOAL #4   Title Pt will pick up cup using big amplitude movements in 5/10 attempts.   Time 4   Period Weeks   Status New   OT SHORT TERM GOAL #5   Title Pt will report incr ease with brushing teeth.   Time 4   Period Weeks   Status New           OT Long Term Goals - 08/25/15 2141    OT LONG TERM GOAL #1   Title Pt will verbalize understanding of adaptive strategies/AE for ADLs prn.--check LTGs 10/23/15   Time 8   Period Weeks   Status New   OT LONG TERM GOAL #2   Title Pt will be able to don shirt using big amplitude movement strategies.   Time 8   Period Weeks   Status New   OT LONG TERM GOAL #3   Title Pt will be able to write name and simple phrase with at least 90% legibility.   Time 8   Period Weeks   Status New   OT LONG TERM GOAL #4   Title Pt will pick up cup using big amplitude movements in 8/10 attempts.   Time 8   Period Weeks   Status New   OT LONG TERM GOAL #5   Title Pt will be able to brush hair with min A.   Time 8   Period Weeks   Status New   Long Term Additional Goals   Additional Long Term Goals Yes   OT LONG TERM GOAL #6   Title Pt will be able to cut food mod I using AE/stratgies prn.   Time 8   Period Weeks   Status New   OT LONG TERM GOAL #7   Title Pt will improve coordination for ADLs as shown by improving time on 9-hole peg test by at [place] with dominant LUE.   Baseline 46.31sec   Time 8   Period Weeks   Status New  Plan - 09/12/15 1253    Clinical Impression Statement Pt is  progressing with repetition and cueing to slow down to improve timing and movement size.  Writing with improved legibility.   Plan strategies for ADLs, practice brushing hair if pt brings in brush, focus on timing and big amplitude movements.   Consulted and Agree with Plan of Care Patient;Family member/caregiver   Family Member Consulted wife        Problem List Patient Active Problem List   Diagnosis Date Noted  . Lumbar stenosis 06/06/2015  . HOCM (hypertrophic obstructive cardiomyopathy) ([employer]) 12/12/2014  . Infection of right prepatellar bursa 10/18/2014  . DOE (dyspnea on exertion) 09/06/2014  . Fatigue due to excessive exertion 09/06/2014  . Testosterone deficiency 11/12/2013  . Routine general medical examination at a health care facility 08/30/2013  . Mixed hyperlipidemia 08/30/2013  . Other abnormal glucose 08/30/2013  . 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 Parkinsonism ([place]) 01/16/2013  . INSOMNIA 06/06/2008  . GLAUCOMA 06/01/2008  . Essential hypertension 06/01/2008  . Seasonal and perennial allergic rhinitis 06/01/2008    [facility] 09/12/2015, 1:08 PM  [facility] [address] [place], [place], [address] Phone: [phone]   Fax:  [phone]  Name: [name] MRN: [id number] Date of Birth: 1946/01/28  Vianne Bulls, OTR/L 09/12/2015 1:08 PM

## 2015-09-13 ENCOUNTER — Ambulatory Visit: Payer: Medicare Other | Admitting: Speech Pathology

## 2015-09-13 ENCOUNTER — Ambulatory Visit: Payer: Medicare Other | Admitting: Occupational Therapy

## 2015-09-13 DIAGNOSIS — R6889 Other general symptoms and signs: Secondary | ICD-10-CM | POA: Diagnosis not present

## 2015-09-13 DIAGNOSIS — R29898 Other symptoms and signs involving the musculoskeletal system: Secondary | ICD-10-CM | POA: Diagnosis not present

## 2015-09-13 DIAGNOSIS — R269 Unspecified abnormalities of gait and mobility: Secondary | ICD-10-CM | POA: Diagnosis not present

## 2015-09-13 DIAGNOSIS — R2689 Other abnormalities of gait and mobility: Secondary | ICD-10-CM | POA: Diagnosis not present

## 2015-09-13 DIAGNOSIS — R279 Unspecified lack of coordination: Secondary | ICD-10-CM | POA: Diagnosis not present

## 2015-09-13 DIAGNOSIS — R4189 Other symptoms and signs involving cognitive functions and awareness: Secondary | ICD-10-CM | POA: Diagnosis not present

## 2015-09-13 DIAGNOSIS — R278 Other lack of coordination: Secondary | ICD-10-CM

## 2015-09-13 DIAGNOSIS — R471 Dysarthria and anarthria: Secondary | ICD-10-CM

## 2015-09-13 DIAGNOSIS — R1312 Dysphagia, oropharyngeal phase: Secondary | ICD-10-CM

## 2015-09-13 NOTE — Therapy (Signed)
[facility] [address] [place], [place], [address] Phone: [phone]   Fax:  [phone]  Speech Language Pathology Treatment  Patient Details  Name: [name] MRN: [id number] Date of Birth: [birth year] No Data Recorded  Encounter Date: 09/13/2015      End of Session - 09/13/15 1405    Visit Number 7   Number of Visits 17   Date for SLP Re-Evaluation 10/10/15   SLP Start Time 73   SLP Stop Time  1401   SLP Time Calculation (min) 43 min      Past Medical History  Diagnosis Date  . HTN (hypertension)   . Glaucoma   . Allergic rhinitis   . Parkinsonism ([place]) 01/16/2013  . Hyperlipidemia   . Thyroid disease     HYPOTHYROID  . Hypogonadism male   . Pre-diabetes   . Complication of anesthesia     Slow to awaken  . Dysphagia   . Hypothyroidism   . Constipation   . GERD (gastroesophageal reflux disease)     Decdreased, has decreased coffee intack.  . Arthritis   . DDD (degenerative disc disease), cervical   . Cancer ([place])     basal cell skin cancer  . OSA (obstructive sleep apnea)     Has had surgery x 2    Past Surgical History  Procedure Laterality Date  . Uvulopalatopharyngoplasty    . Lumbar disc surgery    . Cataract extraction    . I&d extremity Right 10/18/2014    Procedure: IRRIGATION AND DEBRIDEMENT RIGHT KNEE PREPATELLA BURSA INFECTION;  Surgeon: [name], MD;  Location: [facility];  Service: Orthopedics;  Laterality: Right;  . Tonsillectomy      and adenoids  . Nasal septum surgery    . Lumbar laminectomy with coflex 1 level N/A 06/06/2015    Procedure: Lumbar three-four Laminectomy/Foraminotomy with placement of Coflex;  Surgeon: [name], MD;  Location: [facility];  Service: Neurosurgery;  Laterality: N/A;    There were no vitals filed for this visit.  Visit Diagnosis: Dysarthria  Oropharyngeal dysphagia      Subjective Assessment - 09/13/15 1326    Subjective "I am  supposed to do everything slow   Currently in Pain? No/denies               ADULT SLP TREATMENT - 09/13/15 1349    General Information   Behavior/Cognition Alert;Pleasant mood;Cooperative   Treatment Provided   Treatment provided Cognitive-Linquistic   Cognitive-Linquistic Treatment   Skilled Treatment Dysphagia HEP with rare min cues, except for [facility] which required usual min to mod A. Pt verbalized swallowing precautions with occasional questioning cues. Hydrophonia noted 3x - instructed pt to clear his throat when he hears a wet voice and provided rationale for this.  Facilitated intellgibile speech by describing animals for ST to guess with min cues for slow rate - 85% intellgible on this structured task   Assessment / Recommendations / [place] with current plan of care   Progression Toward Goals   Progression toward goals Progressing toward goals          SLP Education - 09/13/15 1358    Education provided Yes   Education Details swallowing strategies, s/s of aspiration pna,    Person(s) Educated Patient;Spouse   Methods Explanation;Demonstration;Verbal cues   Comprehension Verbalized understanding;Returned demonstration;Verbal cues required          SLP Short Term Goals - 09/13/15 1404  SLP SHORT TERM GOAL #1   Title pt will perform HEP with occasional min A   Period Weeks   Status Achieved   SLP SHORT TERM GOAL #2   Title pt will demo swallow precautions with POs to remain safe during meals with occasional min A   Time 1   Period Weeks   Status On-going          SLP Long Term Goals - 09/13/15 1405    SLP LONG TERM GOAL #1   Title pt will perform HEP to improve oropharyneal swallowing ability with rare min A   Time 5   Period Weeks   Status On-going   SLP LONG TERM GOAL #2   Title pt will follow swallow precautions with rare mod A   Time 5   Period Weeks   Status On-going   SLP LONG TERM GOAL #3   Title wife/caregiver will  assist patient in completing HEP with modified independence (use of HEP form)   Time 5   Period Weeks   Status On-going   SLP LONG TERM GOAL #4   Title pt will demo 75% intelligiblity with spontaneous sentence responses   Time 5   Period Weeks   Status On-going          Plan - 09/13/15 1404    Clinical Impression Statement Pt cont to need skilled ST to assess correctness with HEP as well as assessing pt's adherence to swallow precautions. Pt cont to demo impulsivity which hinders progress somewhat. Pt also cont to benefit from skilled ST for rate reduction.   Speech Therapy Frequency 2x / week   Duration 4 weeks   Treatment/Interventions Aspiration precaution training;Pharyngeal strengthening exercises;Diet toleration management by SLP;Compensatory techniques;Environmental controls   Potential to Achieve Goals Fair   Potential Considerations Previous level of function;Severity of impairments        Problem List Patient Active Problem List   Diagnosis Date Noted  . Lumbar stenosis 06/06/2015  . HOCM (hypertrophic obstructive cardiomyopathy) ([place]) 12/12/2014  . Infection of right prepatellar bursa 10/18/2014  . DOE (dyspnea on exertion) 09/06/2014  . Fatigue due to excessive exertion 09/06/2014  . Testosterone deficiency 11/12/2013  . Routine general medical examination at a health care facility 08/30/2013  . Mixed hyperlipidemia 08/30/2013  . Other abnormal glucose 08/30/2013  . 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 Parkinsonism ([place]) 01/16/2013  . INSOMNIA 06/06/2008  . GLAUCOMA 06/01/2008  . Essential hypertension 06/01/2008  . Seasonal and perennial allergic rhinitis 06/01/2008    Kennidee Heyne, Annye Rusk MS, CCC-SLP 09/13/2015, 2:06 PM  [facility] [address] [place], [place], [address] Phone: [phone]   Fax:  [phone]   Name: [name] MRN: [id number] Date of Birth: Apr 19, 1946

## 2015-09-13 NOTE — Therapy (Signed)
[facility] [address] [place], [place], [address] Phone: [phone]   Fax:  [phone]  Occupational Therapy Treatment  Patient Details  Name: [name] MRN: [id number] Date of Birth: January 15, 1946 Referring Provider: Dr. Guadelupe Sabin  Encounter Date: 09/13/2015      OT End of Session - 09/13/15 1501    Visit Number 4   Number of Visits 17   Date for OT Re-Evaluation 10/23/15   Authorization Type Medicare primary, BCBS secondary--need G-code   Authorization - Visit Number 4   Authorization - Number of Visits 10   OT Start Time 1403   OT Stop Time 1450   OT Time Calculation (min) 47 min   Activity Tolerance Patient tolerated treatment well   Behavior During Therapy [facility] for tasks assessed/performed      Past Medical History  Diagnosis Date  . HTN (hypertension)   . Glaucoma   . Allergic rhinitis   . Parkinsonism ([place]) 01/16/2013  . Hyperlipidemia   . Thyroid disease     HYPOTHYROID  . Hypogonadism male   . Pre-diabetes   . Complication of anesthesia     Slow to awaken  . Dysphagia   . Hypothyroidism   . Constipation   . GERD (gastroesophageal reflux disease)     Decdreased, has decreased coffee intack.  . Arthritis   . DDD (degenerative disc disease), cervical   . Cancer ([place])     basal cell skin cancer  . OSA (obstructive sleep apnea)     Has had surgery x 2    Past Surgical History  Procedure Laterality Date  . Uvulopalatopharyngoplasty    . Lumbar disc surgery    . Cataract extraction    . I&d extremity Right 10/18/2014    Procedure: IRRIGATION AND DEBRIDEMENT RIGHT KNEE PREPATELLA BURSA INFECTION;  Surgeon: [name], MD;  Location: [facility];  Service: Orthopedics;  Laterality: Right;  . Tonsillectomy      and adenoids  . Nasal septum surgery    . Lumbar laminectomy with coflex 1 level N/A 06/06/2015    Procedure: Lumbar three-four Laminectomy/Foraminotomy with placement of Coflex;   Surgeon: [name], MD;  Location: [facility];  Service: Neurosurgery;  Laterality: N/A;    There were no vitals filed for this visit.  Visit Diagnosis:  Hypokinesia  Decreased coordination  Rigidity  Decreased functional mobility      Subjective Assessment - 09/13/15 1526    Subjective  Pt expresses frustration with performance.   Patient is accompained by: Family member   Pertinent History see Epic   Patient Stated Goals Per pt/wife:  improve writing, brushing hair, picking up cups, brushing teeth, washing hair, pulling down shirt, cutting food.   Currently in Pain? No/denies                      OT Treatments/Exercises (OP) - 09/13/15 0001    ADLs   Eating Practiced grasp/release of cylinder objects using slow, deliberate big movements.  Pt with min cueing intermittently.  Then practiced screwing/unscrewing lids with slow, big movements with min-mod cueing.   Grooming Practiced brushing hair using slow, big amplitude movements with v.c. and hand over hand assist initially.  Then pt able to continue with big amplitude movements.  Practiced cleaning hands with hand sanitizer with big amplitude movements.  Pt needed mod cueing (verbal and tactile cueing) for big movements   LB Dressing Practiced donning/doffing shoes.  Pt able to  don/doff independently, but needed min cueing to tie tightly.  Recommended double tying to keep shoelaces from coming untied (fall risk).  Recommended that pt put on shoes (wife has been performing) to help incr activity level.  Discussed/educated pt/wife on importance of maintaining activity level to maintain strength, endurance, ROM, coordination and prevent future complications.  Pt/wife verbalized understanding.   Functional Mobility Pt instructed to avoid lifting chair (slide and lean forward instead) when scooting up to table.   ADL Comments Instructed pt/wife that pt will need cueing for big movements and discusessed/instructed wife in  ways to cue pt (verbally and tactile cues).  Also discussed use of counting/rituals to help with ADLs.  Pt/wife verbaliized understanding.              OT Education - 09/13/15 1457    Education provided Yes   Education Details stategies for ADLs (using slow, deliberate, big movements); Functional Use HEP--see pt instructions   Person(s) Educated Patient   Methods Explanation;Demonstration   Comprehension Verbalized understanding;Returned demonstration          OT Short Term Goals - 09/12/15 1307    OT SHORT TERM GOAL #1   Title Pt will perform updated HEP with supervision/min cues.--check STGs 09/22/15   Time 4   Period Weeks   Status New   OT SHORT TERM GOAL #2   Title Pt will write name with at least 75% legibility.   Time 4   Period Weeks   Status New   OT SHORT TERM GOAL #3   Title Pt will report incr ease with washing hair.   Time 4   Period Weeks   Status New   OT SHORT TERM GOAL #4   Title Pt will pick up cup using big amplitude movements in 5/10 attempts.   Time 4   Period Weeks   Status New   OT SHORT TERM GOAL #5   Title Pt will report incr ease with brushing teeth.   Time 4   Period Weeks   Status New           OT Long Term Goals - 08/25/15 2141    OT LONG TERM GOAL #1   Title Pt will verbalize understanding of adaptive strategies/AE for ADLs prn.--check LTGs 10/23/15   Time 8   Period Weeks   Status New   OT LONG TERM GOAL #2   Title Pt will be able to don shirt using big amplitude movement strategies.   Time 8   Period Weeks   Status New   OT LONG TERM GOAL #3   Title Pt will be able to write name and simple phrase with at least 90% legibility.   Time 8   Period Weeks   Status New   OT LONG TERM GOAL #4   Title Pt will pick up cup using big amplitude movements in 8/10 attempts.   Time 8   Period Weeks   Status New   OT LONG TERM GOAL #5   Title Pt will be able to brush hair with min A.   Time 8   Period Weeks   Status New    Long Term Additional Goals   Additional Long Term Goals Yes   OT LONG TERM GOAL #6   Title Pt will be able to cut food mod I using AE/stratgies prn.   Time 8   Period Weeks   Status New   OT LONG TERM GOAL #7   Title Pt will  improve coordination for ADLs as shown by improving time on 9-hole peg test by at [facility] with dominant LUE.   Baseline 46.31sec   Time 8   Period Weeks   Status New               Plan - 09/13/15 1502    Clinical Impression Statement Pt is progressing with improved response with cues for slow, large amplitude movements.     Plan continue with strategies for ADLs, practice donning/doffing shirt, brushing hair, focus on timing and big amplitude movements   Consulted and Agree with Plan of Care Patient;Family member/caregiver   Family Member Consulted wife        Problem List Patient Active Problem List   Diagnosis Date Noted  . Lumbar stenosis 06/06/2015  . HOCM (hypertrophic obstructive cardiomyopathy) ([place]) 12/12/2014  . Infection of right prepatellar bursa 10/18/2014  . DOE (dyspnea on exertion) 09/06/2014  . Fatigue due to excessive exertion 09/06/2014  . Testosterone deficiency 11/12/2013  . Routine general medical examination at a health care facility 08/30/2013  . Mixed hyperlipidemia 08/30/2013  . Other abnormal glucose 08/30/2013  . 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 Parkinsonism ([place]) 01/16/2013  . INSOMNIA 06/06/2008  . GLAUCOMA 06/01/2008  . Essential hypertension 06/01/2008  . Seasonal and perennial allergic rhinitis 06/01/2008    [facility] 09/13/2015, 3:27 PM  [facility] [address] [place], [place], [address] Phone: [phone]   Fax:  [phone]  Name: Daneil Carlo MRN: [id number] Date of Birth: 30-Sep-1946  Vianne Bulls, OTR/L 09/13/2015 3:27 PM

## 2015-09-13 NOTE — Patient Instructions (Signed)
   1. Gather bottles of different sizes/shapes.  Then practice grasp/release of each 5 times in a row using slow, big movements.  Then do the same thing with unscrewing/screwing lid.  2.  Wash hands.  Move from fingertips to palm 5 times with big movements.  Then rub back of each hand 3 times with big movements.  3.  Brush hair within 10 brushes using big movements.

## 2015-09-14 ENCOUNTER — Ambulatory Visit: Payer: Medicare Other | Admitting: Physical Therapy

## 2015-09-14 DIAGNOSIS — R29898 Other symptoms and signs involving the musculoskeletal system: Secondary | ICD-10-CM

## 2015-09-14 DIAGNOSIS — R2689 Other abnormalities of gait and mobility: Secondary | ICD-10-CM

## 2015-09-14 DIAGNOSIS — R4189 Other symptoms and signs involving cognitive functions and awareness: Secondary | ICD-10-CM | POA: Diagnosis not present

## 2015-09-14 DIAGNOSIS — R6889 Other general symptoms and signs: Secondary | ICD-10-CM | POA: Diagnosis not present

## 2015-09-14 DIAGNOSIS — R269 Unspecified abnormalities of gait and mobility: Secondary | ICD-10-CM

## 2015-09-14 DIAGNOSIS — R279 Unspecified lack of coordination: Secondary | ICD-10-CM | POA: Diagnosis not present

## 2015-09-14 NOTE — Therapy (Signed)
[facility] [address] [place], [place], [address] Phone: [phone]   Fax:  [phone]  Physical Therapy Treatment  Patient Details  Name: [name] MRN: [id number] Date of Birth: 29-Jul-1946 Referring Provider: Rexene Alberts  Encounter Date: 09/14/2015      PT End of Session - 09/14/15 2009    Visit Number 4   Number of Visits 17   Date for PT Re-Evaluation 10/23/15   Authorization Type Medicare-G-code every 10th visit   PT Start Time 0846   PT Stop Time 0936   PT Time Calculation (min) 50 min   Equipment Utilized During Treatment Gait belt   Activity Tolerance Patient tolerated treatment well   Behavior During Therapy [facility] for tasks assessed/performed      Past Medical History  Diagnosis Date  . HTN (hypertension)   . Glaucoma   . Allergic rhinitis   . Parkinsonism ([place]) 01/16/2013  . Hyperlipidemia   . Thyroid disease     HYPOTHYROID  . Hypogonadism male   . Pre-diabetes   . Complication of anesthesia     Slow to awaken  . Dysphagia   . Hypothyroidism   . Constipation   . GERD (gastroesophageal reflux disease)     Decdreased, has decreased coffee intack.  . Arthritis   . DDD (degenerative disc disease), cervical   . Cancer ([place])     basal cell skin cancer  . OSA (obstructive sleep apnea)     Has had surgery x 2    Past Surgical History  Procedure Laterality Date  . Uvulopalatopharyngoplasty    . Lumbar disc surgery    . Cataract extraction    . I&d extremity Right 10/18/2014    Procedure: IRRIGATION AND DEBRIDEMENT RIGHT KNEE PREPATELLA BURSA INFECTION;  Surgeon: [name], MD;  Location: [facility];  Service: Orthopedics;  Laterality: Right;  . Tonsillectomy      and adenoids  . Nasal septum surgery    . Lumbar laminectomy with coflex 1 level N/A 06/06/2015    Procedure: Lumbar three-four Laminectomy/Foraminotomy with placement of Coflex;  Surgeon: [name], MD;  Location: [facility];   Service: Neurosurgery;  Laterality: N/A;    There were no vitals filed for this visit.  Visit Diagnosis:  Festinating gait  Rigidity  Abnormality of gait      Subjective Assessment - 09/14/15 0850    Subjective Reports no falls, no changes since last visit; wife requests to try the walker again with the laser.   Currently in Pain? No/denies                         [facility] Adult PT Treatment/Exercise - 09/14/15 0850    Transfers   Transfers Sit to Stand;Stand to Sit   Sit to Stand 4: Min guard;With upper extremity assist;From bed   Sit to Stand Details Tactile cues for weight shifting   Stand to Sit 4: Min guard;With upper extremity assist;To bed   Ambulation/Gait   Ambulation/Gait Yes   Ambulation/Gait Assistance 4: Min guard   Ambulation Distance (Feet) 200 Feet  varied distances in gym   Assistive device Other (Comment)  U-step RW   Gait Pattern Step-through pattern;Decreased step length - right;Decreased step length - left;Decreased dorsiflexion - right;Festinating;Poor foot clearance - right;Poor foot clearance - left   Ambulation Surface Level;Indoor;Unlevel;Outdoor  Ambulance person, Pharmacologist Comments Varied distance walking in gym area trialing U-step RW, including turns, negotiating furniture,  sudden starts/stops, turning around, narrow spaces, doorways with therapist min guard assistance and verbal cues.  Trialed laser light on U-step RW, but pt does not seem to use as a visual cue.  He does continue to have freezing epsiodes with turns, needing verbal cues to stop and reset posture to start again.   Exercises   Exercises Knee/Hip   Knee/Hip Exercises: Aerobic   Stepper SciFit, seated stepper, Level 1.5 x 13 minutes, 4 extremities, keeping RPM >60-70     Gait trial practice with U-step RW with wife holding gait belt simulating household gait activities, with wife providing cueing strategies and min guard assistance.           PT Education -  09/14/15 2008    Education provided Yes   Education Details Discusion/practice of U-step RW.     Person(s) Educated Patient;Spouse   Methods Explanation;Demonstration   Comprehension Verbalized understanding;Returned demonstration;Verbal cues required;Need further instruction  min guard assistance with practice with U-step          PT Short Term Goals - 08/28/15 2143    PT SHORT TERM GOAL #1   Title Pt will perform HEP for Parkinson's-specific deficits with wife's supervision/assistance.  TARGET 09/22/15   Time 4   Period Weeks   Status New   PT SHORT TERM GOAL #2   Title Pt will perform at least 6 of 10 reps of sit<>stand transfers from 18-20 inch surfaces with UE support with no evidence of posterior lean, for improved safety and efficiency of transfers.   Time 4   Period Weeks   Status New   PT SHORT TERM GOAL #3   Title Pt will improve TUG score to less than or equal to 20 seconds for decreased fall risk.   Time 4   Period Weeks   Status New   PT SHORT TERM GOAL #4   Title Pt/wife will verbalize understanding of techniques to decrease festinating/freezing of gait.   Time 4   Period Weeks   Status New           PT Long Term Goals - 08/28/15 2154    PT LONG TERM GOAL #1   Title Pt/wife will verbalize understanding of fall prevention within the home.  TARGET 10/23/15   Time 8   Period Weeks   Status New   PT LONG TERM GOAL #2   Title Pt will improve Berg Balance score to at least 28/56 for decreased fall risk.   Time 8   Period Weeks   Status New   PT LONG TERM GOAL #3   Title Pt will improve gait velocity to at least 2.62 ft/sec for improved gait efficiency and safety.   Time 8   Period Weeks   Status New   PT LONG TERM GOAL #4   Title Pt will perform floor>stand transfers with minimal assistance and UE support for safe fall recovery.   Time 8   Period Weeks   Status New   PT LONG TERM GOAL #5   Title Pt will verbalize plans for continued community  fitness upon D/C from PT.   Time 8   Period Weeks   Status New               Plan - 09/14/15 2009    Clinical Impression Statement Trial again today with U-step RW, with progressive difficulty with gait-varied surfaces, environments, turns, quick starts/stops.  Pt does not appear to utilize laser feature for visual cue; however, U-step  RW continues to provide improved stability versus RW on indoor, outdoor surfaces and straight line and even narrow gait spaces.  Pt continues to have difficulty with festinating/freezing with turns, but no LOB noted.  Pt's wife continues to be legitimately concerned about the possibility of transitioning to something other than current RW.     Pt will benefit from skilled therapeutic intervention in order to improve on the following deficits Abnormal gait;Decreased balance;Decreased mobility;Decreased strength;Decreased safety awareness;Difficulty walking;Postural dysfunction   Rehab Potential Good   PT Frequency 2x / week   PT Duration 8 weeks  plus eval   PT Treatment/Interventions ADLs/Self Care Home Management;Therapeutic exercise;Therapeutic activities;Functional mobility training;Gait training;Balance training;Neuromuscular re-education;Patient/family education   PT Next Visit Plan Review HEP, continue practice with U-step RW with progressive difficulty, initate sit<>stand HEP    Consulted and Agree with Plan of Care Patient;Family member/caregiver   Family Member Consulted wife        Problem List Patient Active Problem List   Diagnosis Date Noted  . Lumbar stenosis 06/06/2015  . HOCM (hypertrophic obstructive cardiomyopathy) ([place]) 12/12/2014  . Infection of right prepatellar bursa 10/18/2014  . DOE (dyspnea on exertion) 09/06/2014  . Fatigue due to excessive exertion 09/06/2014  . Testosterone deficiency 11/12/2013  . Routine general medical examination at a health care facility 08/30/2013  . Mixed hyperlipidemia 08/30/2013  . Other  abnormal glucose 08/30/2013  . 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 Parkinsonism ([place]) 01/16/2013  . INSOMNIA 06/06/2008  . GLAUCOMA 06/01/2008  . Essential hypertension 06/01/2008  . Seasonal and perennial allergic rhinitis 06/01/2008    Briseyda Fehr W. 09/14/2015, 8:18 PM  Frazier Butt., PT  [facility] [address] [place], [place], [address] Phone: [phone]   Fax:  [phone]  Name: [name] MRN: [id number] Date of Birth: 1945-12-20

## 2015-09-16 ENCOUNTER — Encounter: Payer: Self-pay | Admitting: Internal Medicine

## 2015-09-16 DIAGNOSIS — Z85828 Personal history of other malignant neoplasm of skin: Secondary | ICD-10-CM | POA: Diagnosis not present

## 2015-09-16 DIAGNOSIS — L738 Other specified follicular disorders: Secondary | ICD-10-CM | POA: Diagnosis not present

## 2015-09-16 DIAGNOSIS — L814 Other melanin hyperpigmentation: Secondary | ICD-10-CM | POA: Diagnosis not present

## 2015-09-16 DIAGNOSIS — D225 Melanocytic nevi of trunk: Secondary | ICD-10-CM | POA: Diagnosis not present

## 2015-09-16 DIAGNOSIS — D2261 Melanocytic nevi of right upper limb, including shoulder: Secondary | ICD-10-CM | POA: Diagnosis not present

## 2015-09-16 DIAGNOSIS — L570 Actinic keratosis: Secondary | ICD-10-CM | POA: Diagnosis not present

## 2015-09-19 ENCOUNTER — Ambulatory Visit: Payer: Medicare Other | Admitting: Occupational Therapy

## 2015-09-19 DIAGNOSIS — R29898 Other symptoms and signs involving the musculoskeletal system: Secondary | ICD-10-CM | POA: Diagnosis not present

## 2015-09-19 DIAGNOSIS — R6889 Other general symptoms and signs: Secondary | ICD-10-CM | POA: Diagnosis not present

## 2015-09-19 DIAGNOSIS — R2689 Other abnormalities of gait and mobility: Secondary | ICD-10-CM | POA: Diagnosis not present

## 2015-09-19 DIAGNOSIS — R279 Unspecified lack of coordination: Secondary | ICD-10-CM

## 2015-09-19 DIAGNOSIS — R4189 Other symptoms and signs involving cognitive functions and awareness: Secondary | ICD-10-CM | POA: Diagnosis not present

## 2015-09-19 DIAGNOSIS — R278 Other lack of coordination: Secondary | ICD-10-CM

## 2015-09-19 DIAGNOSIS — R269 Unspecified abnormalities of gait and mobility: Secondary | ICD-10-CM | POA: Diagnosis not present

## 2015-09-19 DIAGNOSIS — R2681 Unsteadiness on feet: Secondary | ICD-10-CM

## 2015-09-19 NOTE — Therapy (Signed)
[facility] [address] [place], [place], [address] Phone: [phone]   Fax:  [phone]  Occupational Therapy Treatment  Patient Details  Name: [name] MRN: [id number] Date of Birth: 1946/09/22 Referring Provider: Dr. Guadelupe Sabin  Encounter Date: 09/19/2015      OT End of Session - 09/19/15 1311    Visit Number 5   Number of Visits 17   Date for OT Re-Evaluation 10/23/15   Authorization Type Medicare primary, BCBS secondary--need G-code   Authorization - Visit Number 5   Authorization - Number of Visits 10   OT Start Time 1147   OT Stop Time 1230   OT Time Calculation (min) 43 min   Activity Tolerance Patient tolerated treatment well   Behavior During Therapy [facility] for tasks assessed/performed      Past Medical History  Diagnosis Date  . HTN (hypertension)   . Glaucoma   . Allergic rhinitis   . Parkinsonism ([place]) 01/16/2013  . Hyperlipidemia   . Thyroid disease     HYPOTHYROID  . Hypogonadism male   . Pre-diabetes   . Complication of anesthesia     Slow to awaken  . Dysphagia   . Hypothyroidism   . Constipation   . GERD (gastroesophageal reflux disease)     Decdreased, has decreased coffee intack.  . Arthritis   . DDD (degenerative disc disease), cervical   . Cancer ([place])     basal cell skin cancer  . OSA (obstructive sleep apnea)     Has had surgery x 2    Past Surgical History  Procedure Laterality Date  . Uvulopalatopharyngoplasty    . Lumbar disc surgery    . Cataract extraction    . I&d extremity Right 10/18/2014    Procedure: IRRIGATION AND DEBRIDEMENT RIGHT KNEE PREPATELLA BURSA INFECTION;  Surgeon: [name], MD;  Location: [facility];  Service: Orthopedics;  Laterality: Right;  . Tonsillectomy      and adenoids  . Nasal septum surgery    . Lumbar laminectomy with coflex 1 level N/A 06/06/2015    Procedure: Lumbar three-four Laminectomy/Foraminotomy with placement of Coflex;   Surgeon: [name], MD;  Location: [facility];  Service: Neurosurgery;  Laterality: N/A;    There were no vitals filed for this visit.  Visit Diagnosis:  Hypokinesia  Decreased coordination  Decreased functional mobility  Unsteadiness  Rigidity      Subjective Assessment - 09/19/15 1309    Subjective  "I feel like I'm starting over"  Pt reports incr ease with washing hair   Patient is accompained by: Family member   Pertinent History see Epic   Patient Stated Goals Per pt/wife:  improve writing, brushing hair, picking up cups, brushing teeth, washing hair, pulling down shirt, cutting food.   Currently in Pain? No/denies                      OT Treatments/Exercises (OP) - 09/19/15 0001    ADLs   Grooming Verbally reviewed strategy for brushing hair and to stop/bring arm down and restart with freezing/small movements.   UB Dressing Practiced donning/doffing button-up shirt.  Instructed pt to perform in sitting due to decr balance and practiced in sitting.  Min-mod cues to open hands big and use big amplitude movements when pulling on as well as to position shirt for incr ease.  Practiced x3 with improved performance with practice/cues.  Practiced donning/doffing jacket in sitting with min v.c.  to push off shoulders first when doffing.  Pt/wife verbalized understanding of techniques.   Bathing Discussed/simulated washing hair with min v.c. for big movements.  Simulated washing hands with mod verbal/tactile/visual cues for big movements.   Functional Mobility Reviewed/practiced technique for sit>stand and stand>sit using forward lean/weight shift and for UE placement.  Pt able to return demo with repetition, but then did not carryover later in the session and needed review.             PWR [facility]) - 09/19/15 1708    PWR! exercises Moves in sitting;Hands   PWR! Up with min v. cues for PWR! hands (slow down and big movements)   PWR! Up x10 with cueing for  extending elbow and using visual target to look up   PWR! Rock x10   PWR! Step x10 with object to step over (mod cues) and to place both feet flat on floor for full weight shift.  Recommended pt/wife use painter's tape for visual cue at home.   Comments with mod verbal/visual cues for timing/big movements in sitting.              OT Education - 09/19/15 1712    Education Details strategies for ADLs, PWR! HEP (seated)          OT Short Term Goals - 09/12/15 1307    OT SHORT TERM GOAL #1   Title Pt will perform updated HEP with supervision/min cues.--check STGs 09/22/15   Time 4   Period Weeks   Status New   OT SHORT TERM GOAL #2   Title Pt will write name with at least 75% legibility.   Time 4   Period Weeks   Status New   OT SHORT TERM GOAL #3   Title Pt will report incr ease with washing hair.   Time 4   Period Weeks   Status New   OT SHORT TERM GOAL #4   Title Pt will pick up cup using big amplitude movements in 5/10 attempts.   Time 4   Period Weeks   Status New   OT SHORT TERM GOAL #5   Title Pt will report incr ease with brushing teeth.   Time 4   Period Weeks   Status New           OT Long Term Goals - 08/25/15 2141    OT LONG TERM GOAL #1   Title Pt will verbalize understanding of adaptive strategies/AE for ADLs prn.--check LTGs 10/23/15   Time 8   Period Weeks   Status New   OT LONG TERM GOAL #2   Title Pt will be able to don shirt using big amplitude movement strategies.   Time 8   Period Weeks   Status New   OT LONG TERM GOAL #3   Title Pt will be able to write name and simple phrase with at least 90% legibility.   Time 8   Period Weeks   Status New   OT LONG TERM GOAL #4   Title Pt will pick up cup using big amplitude movements in 8/10 attempts.   Time 8   Period Weeks   Status New   OT LONG TERM GOAL #5   Title Pt will be able to brush hair with min A.   Time 8   Period Weeks   Status New   Long Term Additional Goals    Additional Long Term Goals Yes   OT LONG TERM GOAL #6  Title Pt will be able to cut food mod I using AE/stratgies prn.   Time 8   Period Weeks   Status New   OT LONG TERM GOAL #7   Title Pt will improve coordination for ADLs as shown by improving time on 9-hole peg test by at [place] with dominant LUE.   Baseline 46.31sec   Time 8   Period Weeks   Status New               Plan - 09/19/15 1312    Clinical Impression Statement Pt reports incr ease with washing hair and improved performance with cueing, but pt continues to need cueing to complete functional tasks/perform HEP.   Plan daughter plans to come to next appt:  review strategies for donning/doffing shirt, brushing hair, washing hands with focus on timing and big amplitude movements with pt/daughter (also reinforce sit>stand and controlled sit prn).  Check STGs next week as pt has not been seen frequency.  ?Schedule more visits   OT Home Exercise Plan Education issued:  HEP for functional activities with focus on timing/big amplitude movements   Consulted and Agree with Plan of Care Patient   Family Member Consulted wife        Problem List Patient Active Problem List   Diagnosis Date Noted  . Lumbar stenosis 06/06/2015  . HOCM (hypertrophic obstructive cardiomyopathy) ([place]) 12/12/2014  . Infection of right prepatellar bursa 10/18/2014  . DOE (dyspnea on exertion) 09/06/2014  . Fatigue due to excessive exertion 09/06/2014  . Testosterone deficiency 11/12/2013  . Routine general medical examination at a health care facility 08/30/2013  . Mixed hyperlipidemia 08/30/2013  . Other abnormal glucose 08/30/2013  . 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 Parkinsonism ([place]) 01/16/2013  . INSOMNIA 06/06/2008  . GLAUCOMA 06/01/2008  . Essential hypertension 06/01/2008  . Seasonal and perennial allergic rhinitis 06/01/2008    [facility] 09/19/2015, 5:32 PM  [facility] [address] [place], [place], [address] Phone: [phone]   Fax:  [phone]  Name: [name] MRN: [id number] Date of Birth: 26-Jun-1946  Vianne Bulls, OTR/L 09/19/2015 5:32 PM

## 2015-09-20 ENCOUNTER — Ambulatory Visit: Payer: Medicare Other | Admitting: Physical Therapy

## 2015-09-21 ENCOUNTER — Ambulatory Visit: Payer: Medicare Other | Admitting: Physical Therapy

## 2015-09-21 ENCOUNTER — Ambulatory Visit: Payer: Medicare Other

## 2015-09-21 ENCOUNTER — Ambulatory Visit: Payer: Medicare Other | Admitting: Occupational Therapy

## 2015-09-21 DIAGNOSIS — R471 Dysarthria and anarthria: Secondary | ICD-10-CM

## 2015-09-21 DIAGNOSIS — R29898 Other symptoms and signs involving the musculoskeletal system: Secondary | ICD-10-CM | POA: Diagnosis not present

## 2015-09-21 DIAGNOSIS — R269 Unspecified abnormalities of gait and mobility: Secondary | ICD-10-CM | POA: Diagnosis not present

## 2015-09-21 DIAGNOSIS — R2689 Other abnormalities of gait and mobility: Secondary | ICD-10-CM

## 2015-09-21 DIAGNOSIS — R1312 Dysphagia, oropharyngeal phase: Secondary | ICD-10-CM

## 2015-09-21 DIAGNOSIS — R279 Unspecified lack of coordination: Secondary | ICD-10-CM

## 2015-09-21 DIAGNOSIS — R6889 Other general symptoms and signs: Secondary | ICD-10-CM | POA: Diagnosis not present

## 2015-09-21 DIAGNOSIS — R4189 Other symptoms and signs involving cognitive functions and awareness: Secondary | ICD-10-CM

## 2015-09-21 DIAGNOSIS — R278 Other lack of coordination: Secondary | ICD-10-CM

## 2015-09-21 DIAGNOSIS — R2681 Unsteadiness on feet: Secondary | ICD-10-CM

## 2015-09-21 NOTE — Therapy (Signed)
[facility] [address] [place], [place], [address] Phone: [phone]   Fax:  [phone]  Speech Language Pathology Treatment  Patient Details  Name: [name] MRN: [id number] Date of Birth: 10-Oct-1945 No Data Recorded  Encounter Date: 09/21/2015      End of Session - 09/21/15 1451    Visit Number 8   Number of Visits 62   SLP Start Time [id number]   SLP Stop Time  1446   SLP Time Calculation (min) 43 min   Activity Tolerance Patient tolerated treatment well      Past Medical History  Diagnosis Date  . HTN (hypertension)   . Glaucoma   . Allergic rhinitis   . Parkinsonism ([place]) 01/16/2013  . Hyperlipidemia   . Thyroid disease     HYPOTHYROID  . Hypogonadism male   . Pre-diabetes   . Complication of anesthesia     Slow to awaken  . Dysphagia   . Hypothyroidism   . Constipation   . GERD (gastroesophageal reflux disease)     Decdreased, has decreased coffee intack.  . Arthritis   . DDD (degenerative disc disease), cervical   . Cancer ([place])     basal cell skin cancer  . OSA (obstructive sleep apnea)     Has had surgery x 2    Past Surgical History  Procedure Laterality Date  . Uvulopalatopharyngoplasty    . Lumbar disc surgery    . Cataract extraction    . I&d extremity Right 10/18/2014    Procedure: IRRIGATION AND DEBRIDEMENT RIGHT KNEE PREPATELLA BURSA INFECTION;  Surgeon: [name], MD;  Location: [facility];  Service: Orthopedics;  Laterality: Right;  . Tonsillectomy      and adenoids  . Nasal septum surgery    . Lumbar laminectomy with coflex 1 level N/A 06/06/2015    Procedure: Lumbar three-four Laminectomy/Foraminotomy with placement of Coflex;  Surgeon: [name], MD;  Location: [facility];  Service: Neurosurgery;  Laterality: N/A;    There were no vitals filed for this visit.  Visit Diagnosis: Oropharyngeal dysphagia  Dysarthria      Subjective Assessment - 09/21/15 1404     Subjective "nagging" (in response to SLP - "Sometimes those reminders (for swallow compensations/speech compensations) are par for the course these days.")   Patient is accompained by: Family member  daughter, [name]               ADULT SLP TREATMENT - 09/21/15 1406    General Information   Behavior/Cognition Alert;Pleasant mood;Cooperative   Treatment Provided   Treatment provided Cognitive-Linquistic;Dysphagia   Dysphagia Treatment   Temperature Spikes Noted No   Treatment Methods Therapeutic exercise;Compensation strategy training;Patient/caregiver education;Skilled observation   Patient observed directly with PO's Yes   Type of PO's observed Thin liquids   Pharyngeal Phase Signs & Symptoms Immediate throat clear;Immediate cough  on 15% of swallows   Other treatment/comments "Small sips" (pt's response to SLP "What do you do to keep from coughing?")  SLP reiterated small sips and "Collect and swallow hard." with each swallow of liquids. Pt frequency of cough decr'd slightly. Pt demo'd HEP with usual min A. SLP stressed need to complete HEP with a helper.   Pain Assessment   Pain Assessment No/denies pain   Cognitive-Linquistic Treatment   Treatment focused on Dysarthria   Skilled Treatment Pt utilized a metronome with varying success responding to structured tasks with sentence responses. Pt had more success with watching the needle  extremes than with the sound, making responses with poor eye contact. With just sound (metronome turned around) pt needed to close his eyes to keep rate slowed. Success with visual cue to reduce rate- 80%, success with just auditory stimuli to reduce rate (and closed eyes) - 70%, without closed eyes was approx 30% success.    Assessment / Recommendations / Plan   Plan Continue with current plan of care   Progression Toward Goals   Progression toward goals Progressing toward goals            SLP Short Term Goals - 09/21/15 1453    SLP SHORT  TERM GOAL #1   Title pt will perform HEP with occasional min A   Period Weeks   Status Achieved   SLP SHORT TERM GOAL #2   Title pt will demo swallow precautions with POs to remain safe during meals with occasional min A   Time 1   Period Weeks   Status On-going          SLP Long Term Goals - 09/13/15 1405    SLP LONG TERM GOAL #1   Title pt will perform HEP to improve oropharyneal swallowing ability with rare min A   Time 5   Period Weeks   Status On-going   SLP LONG TERM GOAL #2   Title pt will follow swallow precautions with rare mod A   Time 5   Period Weeks   Status On-going   SLP LONG TERM GOAL #3   Title wife/caregiver will assist patient in completing HEP with modified independence (use of HEP form)   Time 5   Period Weeks   Status On-going   SLP LONG TERM GOAL #4   Title pt will demo 75% intelligiblity with spontaneous sentence responses   Time 5   Period Weeks   Status On-going          Plan - 09/21/15 1451    Clinical Impression Statement Pt cont to need skilled ST to assess correctness with HEP as well as assessing pt's adherence to swallow precautions. Pt cont to demo impulsivity which hinders progress somewhat. Pt also cont to benefit from skilled ST for rate reduction.   Speech Therapy Frequency 2x / week   Duration 4 weeks   Treatment/Interventions Aspiration precaution training;Pharyngeal strengthening exercises;Diet toleration management by SLP;Compensatory techniques;Environmental controls   Potential to Achieve Goals Fair   Potential Considerations Previous level of function;Severity of impairments        Problem List Patient Active Problem List   Diagnosis Date Noted  . Lumbar stenosis 06/06/2015  . HOCM (hypertrophic obstructive cardiomyopathy) ([place]) 12/12/2014  . Infection of right prepatellar bursa 10/18/2014  . DOE (dyspnea on exertion) 09/06/2014  . Fatigue due to excessive exertion 09/06/2014  . Testosterone deficiency 11/12/2013   . Routine general medical examination at a health care facility 08/30/2013  . Mixed hyperlipidemia 08/30/2013  . Other abnormal glucose 08/30/2013  . 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 Parkinsonism ([place]) 01/16/2013  . INSOMNIA 06/06/2008  . GLAUCOMA 06/01/2008  . Essential hypertension 06/01/2008  . Seasonal and perennial allergic rhinitis 06/01/2008    [facility] , MS, CCC-SLP  09/21/2015, 2:55 PM  [facility] [address] [place], [place], [address] Phone: [phone]   Fax:  [phone]   Name: Trevious Fouche MRN: [id number] Date of Birth: 1946/07/13

## 2015-09-21 NOTE — Therapy (Signed)
[facility] [address] [place], [place], [address] Phone: [phone]   Fax:  [phone]  Occupational Therapy Treatment  Patient Details  Name: [name] MRN: [id number] Date of Birth: 1946-09-10 Referring Provider: Dr. Guadelupe Sabin  Encounter Date: 09/21/2015      OT End of Session - 09/21/15 2021    Visit Number 6   Number of Visits 17   Date for OT Re-Evaluation 10/23/15   Authorization Type Medicare primary, BCBS secondary--need G-code   Authorization - Visit Number 6   Authorization - Number of Visits 10   OT Start Time 1318   OT Stop Time 1400   OT Time Calculation (min) 42 min      Past Medical History  Diagnosis Date  . HTN (hypertension)   . Glaucoma   . Allergic rhinitis   . Parkinsonism ([place]) 01/16/2013  . Hyperlipidemia   . Thyroid disease     HYPOTHYROID  . Hypogonadism male   . Pre-diabetes   . Complication of anesthesia     Slow to awaken  . Dysphagia   . Hypothyroidism   . Constipation   . GERD (gastroesophageal reflux disease)     Decdreased, has decreased coffee intack.  . Arthritis   . DDD (degenerative disc disease), cervical   . Cancer ([place])     basal cell skin cancer  . OSA (obstructive sleep apnea)     Has had surgery x 2    Past Surgical History  Procedure Laterality Date  . Uvulopalatopharyngoplasty    . Lumbar disc surgery    . Cataract extraction    . I&d extremity Right 10/18/2014    Procedure: IRRIGATION AND DEBRIDEMENT RIGHT KNEE PREPATELLA BURSA INFECTION;  Surgeon: [name], MD;  Location: [facility];  Service: Orthopedics;  Laterality: Right;  . Tonsillectomy      and adenoids  . Nasal septum surgery    . Lumbar laminectomy with coflex 1 level N/A 06/06/2015    Procedure: Lumbar three-four Laminectomy/Foraminotomy with placement of Coflex;  Surgeon: [name], MD;  Location: [facility];  Service: Neurosurgery;  Laterality: N/A;    There were no  vitals filed for this visit.  Visit Diagnosis:  Hypokinesia  Decreased coordination  Decreased functional mobility  Unsteadiness  Rigidity  Cognitive deficits      Subjective Assessment - 09/21/15 2017    Patient is accompained by: Family member  dtr   Pertinent History see Epic   Patient Stated Goals Per pt/wife:  improve writing, brushing hair, picking up cups, brushing teeth, washing hair, pulling down shirt, cutting food.   Currently in Pain? No/denies          Treatment:Pt's daughter was present today. Therapist reviewed PWR up, rock and step in seated with pt/ dtr. Education provided regarding cueing and safety for brushing hair, donning shirt and jacket in seated and sit to stand with big movements. Pt/ daughter verbalize understanding, pt requires min-mod v.c.                      OT Short Term Goals - 09/12/15 1307    OT SHORT TERM GOAL #1   Title Pt will perform updated HEP with supervision/min cues.--check STGs 09/22/15   Time 4   Period Weeks   Status New   OT SHORT TERM GOAL #2   Title Pt will write name with at least 75% legibility.   Time 4   Period Weeks  Status New   OT SHORT TERM GOAL #3   Title Pt will report incr ease with washing hair.   Time 4   Period Weeks   Status New   OT SHORT TERM GOAL #4   Title Pt will pick up cup using big amplitude movements in 5/10 attempts.   Time 4   Period Weeks   Status New   OT SHORT TERM GOAL #5   Title Pt will report incr ease with brushing teeth.   Time 4   Period Weeks   Status New           OT Long Term Goals - 08/25/15 2141    OT LONG TERM GOAL #1   Title Pt will verbalize understanding of adaptive strategies/AE for ADLs prn.--check LTGs 10/23/15   Time 8   Period Weeks   Status New   OT LONG TERM GOAL #2   Title Pt will be able to don shirt using big amplitude movement strategies.   Time 8   Period Weeks   Status New   OT LONG TERM GOAL #3   Title Pt will be able  to write name and simple phrase with at least 90% legibility.   Time 8   Period Weeks   Status New   OT LONG TERM GOAL #4   Title Pt will pick up cup using big amplitude movements in 8/10 attempts.   Time 8   Period Weeks   Status New   OT LONG TERM GOAL #5   Title Pt will be able to brush hair with min A.   Time 8   Period Weeks   Status New   Long Term Additional Goals   Additional Long Term Goals Yes   OT LONG TERM GOAL #6   Title Pt will be able to cut food mod I using AE/stratgies prn.   Time 8   Period Weeks   Status New   OT LONG TERM GOAL #7   Title Pt will improve coordination for ADLs as shown by improving time on 9-hole peg test by at [place] with dominant LUE.   Baseline 46.31sec   Time 8   Period Weeks   Status New               Plan - 09/21/15 2018    Clinical Impression Statement Pt is progressing towards goals. Pt reponds well to cueing for bigger movements during functional tasks. Pt 's daughter verbalizes understanding.   Pt will benefit from skilled therapeutic intervention in order to improve on the following deficits (Retired) Decreased cognition;Decreased mobility;Impaired perceived functional ability;Impaired UE functional use;Decreased knowledge of use of DME;Decreased balance;Decreased knowledge of precautions;Impaired tone;Decreased safety awareness;Decreased coordination   Rehab Potential Good   OT Frequency 2x / week   OT Duration 8 weeks   OT Treatment/Interventions Self-care/ADL training;Cryotherapy;Electrical Stimulation;Moist Heat;Passive range of motion;Cognitive remediation/compensation;Fluidtherapy;DME and/or AE instruction;Therapeutic activities;Therapeutic exercises;Energy conservation;Neuromuscular education;Ultrasound;Manual Therapy;Splinting;Patient/family education;Therapist, nutritional;Therapeutic exercise   Plan check STG's, schedule more visits   OT Home Exercise Plan Education issued:  HEP for functional activities  with focus on timing/big amplitude movements   Consulted and Agree with Plan of Care Patient;Family member/caregiver   Family Member Consulted daughter        Problem List Patient Active Problem List   Diagnosis Date Noted  . Lumbar stenosis 06/06/2015  . HOCM (hypertrophic obstructive cardiomyopathy) ([place]) 12/12/2014  . Infection of right prepatellar bursa 10/18/2014  . DOE (dyspnea on exertion) 09/06/2014  .  Fatigue due to excessive exertion 09/06/2014  . Testosterone deficiency 11/12/2013  . Routine general medical examination at a health care facility 08/30/2013  . Mixed hyperlipidemia 08/30/2013  . Other abnormal glucose 08/30/2013  . 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 Parkinsonism ([place]) 01/16/2013  . INSOMNIA 06/06/2008  . GLAUCOMA 06/01/2008  . Essential hypertension 06/01/2008  . Seasonal and perennial allergic rhinitis 06/01/2008    RINE,KATHRYN 09/21/2015, 8:25 PM Theone Murdoch, OTR/L [phone] Phone: [phone] 8:25 PM 09/21/2015 [facility] [address] [place], [place], [address] Phone: [phone]   Fax:  [phone]  Name: [name] MRN: [id number] Date of Birth: 1946/05/24

## 2015-09-21 NOTE — Patient Instructions (Signed)
Practice with the metronome!

## 2015-09-22 ENCOUNTER — Ambulatory Visit: Payer: Medicare Other | Admitting: Speech Pathology

## 2015-09-22 ENCOUNTER — Ambulatory Visit: Payer: Medicare Other | Admitting: Physical Therapy

## 2015-09-22 DIAGNOSIS — R258 Other abnormal involuntary movements: Secondary | ICD-10-CM

## 2015-09-22 DIAGNOSIS — R269 Unspecified abnormalities of gait and mobility: Secondary | ICD-10-CM

## 2015-09-22 DIAGNOSIS — R471 Dysarthria and anarthria: Secondary | ICD-10-CM

## 2015-09-22 DIAGNOSIS — R29898 Other symptoms and signs involving the musculoskeletal system: Secondary | ICD-10-CM | POA: Diagnosis not present

## 2015-09-22 DIAGNOSIS — R4189 Other symptoms and signs involving cognitive functions and awareness: Secondary | ICD-10-CM | POA: Diagnosis not present

## 2015-09-22 DIAGNOSIS — R279 Unspecified lack of coordination: Secondary | ICD-10-CM | POA: Diagnosis not present

## 2015-09-22 DIAGNOSIS — R6889 Other general symptoms and signs: Secondary | ICD-10-CM | POA: Diagnosis not present

## 2015-09-22 DIAGNOSIS — R1312 Dysphagia, oropharyngeal phase: Secondary | ICD-10-CM

## 2015-09-22 DIAGNOSIS — R2689 Other abnormalities of gait and mobility: Secondary | ICD-10-CM | POA: Diagnosis not present

## 2015-09-22 NOTE — Therapy (Signed)
[facility] [address] [place], [place], [address] Phone: [phone]   Fax:  [phone]  Physical Therapy Treatment  Patient Details  Name: [name] MRN: [id number] Date of Birth: Jul 16, 1946 Referring Provider: Rexene Alberts  Encounter Date: 09/22/2015      PT End of Session - 09/22/15 1004    Visit Number 6   Number of Visits 17   Date for PT Re-Evaluation 10/23/15   Authorization Type Medicare-G-code every 10th visit   PT Start Time 0848   PT Stop Time 0930   PT Time Calculation (min) 42 min   Equipment Utilized During Treatment Gait belt   Activity Tolerance Patient tolerated treatment well   Behavior During Therapy [facility] for tasks assessed/performed      Past Medical History  Diagnosis Date  . HTN (hypertension)   . Glaucoma   . Allergic rhinitis   . Parkinsonism ([place]) 01/16/2013  . Hyperlipidemia   . Thyroid disease     HYPOTHYROID  . Hypogonadism male   . Pre-diabetes   . Complication of anesthesia     Slow to awaken  . Dysphagia   . Hypothyroidism   . Constipation   . GERD (gastroesophageal reflux disease)     Decdreased, has decreased coffee intack.  . Arthritis   . DDD (degenerative disc disease), cervical   . Cancer ([place])     basal cell skin cancer  . OSA (obstructive sleep apnea)     Has had surgery x 2    Past Surgical History  Procedure Laterality Date  . Uvulopalatopharyngoplasty    . Lumbar disc surgery    . Cataract extraction    . I&d extremity Right 10/18/2014    Procedure: IRRIGATION AND DEBRIDEMENT RIGHT KNEE PREPATELLA BURSA INFECTION;  Surgeon: [name], MD;  Location: [facility];  Service: Orthopedics;  Laterality: Right;  . Tonsillectomy      and adenoids  . Nasal septum surgery    . Lumbar laminectomy with coflex 1 level N/A 06/06/2015    Procedure: Lumbar three-four Laminectomy/Foraminotomy with placement of Coflex;  Surgeon: [name], MD;  Location: [facility];   Service: Neurosurgery;  Laterality: N/A;    There were no vitals filed for this visit.  Visit Diagnosis:  Abnormality of gait  Bradykinesia      Subjective Assessment - 09/22/15 0953    Subjective No changes, no falls.  Wife present with patient today.   Patient is accompained by: Family member   Pertinent History Back surgery August 2016; spinal lesion C6 (on MRI-MD not planning to do further work-up)   Patient Stated Goals Pt's goal for therapy is to improve balance and walking   Currently in Pain? No/denies                         [facility] Adult PT Treatment/Exercise - 09/22/15 0954    Transfers   Transfers Sit to Stand;Stand to Sit   Sit to Stand 5: Supervision;4: Min guard;With upper extremity assist;From bed;From chair/3-in-1   Sit to Stand Details Verbal cues for precautions/safety;Verbal cues for technique   Sit to Stand Details (indicate cue type and reason) Discussed importance of slowed descent into sitting and even forward lean and push up to stand (to avoid lean to L and to avoid uncontrolled descent)   Stand to Sit 5: Supervision;4: Min guard;With upper extremity assist;With armrests;To bed;To chair/3-in-1   Number of Reps 10 reps;2 sets  from  bed, from mat   Comments for functional strengthening   Ambulation/Gait   Ambulation/Gait Yes   Ambulation/Gait Assistance 4: Min guard   Ambulation Distance (Feet) 120 Feet  100 ft x 2   Assistive device 4-wheeled walker;Rolling walker  trial fo 4-wheeled RW   Gait Pattern Step-through pattern;Decreased step length - right;Decreased step length - left;Decreased dorsiflexion - right;Festinating   Ambulation Surface Level;Indoor   Gait Comments Trialed 4-wheel RW at pt's request today.  PT strongly advises that 4-wheeled RW is not safe for patient to use and will not be using again in therapy sessions.  This is because of pt's tendency to increased gait speed, have increased fall risk with distractions, with high  likelihood of walker getting too far in front, causing a potential fall.   Self-Care   Self-Care Other Self-Care Comments   Other Self-Care Comments  Self Care:  Had discussion with pt/wife regarding walker situation:  While PT feels that U-step RW may be a good option for patient, he does seem to negotiate turns better with his rolling walker.  Likely will not pursue U-step RW for home at this time.  Pt feels he wants a 4-wheeled RW-PT strongly advises against 4-wheeled RW due to extreme safety concerns.  In general, reminded patient that goal for standing and walking is safety-he need's family close supervision, and he needs to only focus on walking-no distractions, no letting go of walker, no dual tasking.  Discussed POC and potential for continuing towards LTGs.  Pt, wife, PT feel that pt could continue to benefit from further skilled PT to highlight functional strength, gait and safety with balance activities as appropriate.  Discussed potential return to [employer]-PT recommends use of seated aerobic machines only (Seated stepper and recumbent bike) with family member assist on and off machines for safety.  Recommended not to utilize other weight machines due to safety concerns.   Exercises   Exercises Knee/Hip   Knee/Hip Exercises: Aerobic   Stepper SciFit, seated stepper, Level 2.2 x 6 minutes, 4 extremities, keeping RPM >70 with cues for full stride length   Other Aerobic seated foot pedaler 2 minutes forward, 2 minute back at max resistance, cues for upright posture with sitting.      Standing squats at walker (PT holding walker for safety), x 10 reps, for functional lower extremity strengthening.          PT Education - 09/22/15 1004    Education provided Yes   Education Details gait/assistive device/safety; return to [employer] activities, POC   Person(s) Educated Patient;Spouse   Methods Explanation;Verbal cues;Demonstration   Comprehension Verbalized understanding;Returned  demonstration;Need further instruction          PT Short Term Goals - 09/22/15 1005    PT SHORT TERM GOAL #1   Title Pt will perform HEP for Parkinson's-specific deficits with wife's supervision/assistance.  TARGET 09/22/15   Baseline per wife's report-sit<>stand transfers for HEP   Time 4   Period Weeks   Status Achieved   PT SHORT TERM GOAL #2   Title Pt will perform at least 6 of 10 reps of sit<>stand transfers from 18-20 inch surfaces with UE support with no evidence of posterior lean, for improved safety and efficiency of transfers.   Time 4   Period Weeks   Status Achieved   PT SHORT TERM GOAL #3   Title Pt will improve TUG score to less than or equal to 20 seconds for decreased fall risk.   Time 4  Period Weeks   Status Not Met   PT SHORT TERM GOAL #4   Title Pt/wife will verbalize understanding of techniques to decrease festinating/freezing of gait.   Baseline Wife verbalizes understanding and provides cues for pt.   Time 4   Period Weeks   Status Partially Met           PT Long Term Goals - 08/28/15 2154    PT LONG TERM GOAL #1   Title Pt/wife will verbalize understanding of fall prevention within the home.  TARGET 10/23/15   Time 8   Period Weeks   Status New   PT LONG TERM GOAL #2   Title Pt will improve Berg Balance score to at least 28/56 for decreased fall risk.   Time 8   Period Weeks   Status New   PT LONG TERM GOAL #3   Title Pt will improve gait velocity to at least 2.62 ft/sec for improved gait efficiency and safety.   Time 8   Period Weeks   Status New   PT LONG TERM GOAL #4   Title Pt will perform floor>stand transfers with minimal assistance and UE support for safe fall recovery.   Time 8   Period Weeks   Status New   PT LONG TERM GOAL #5   Title Pt will verbalize plans for continued community fitness upon D/C from PT.   Time 8   Period Weeks   Status New               Plan - 09/22/15 1006    Clinical Impression Statement  STG #1 and 2 met.  STG #3 not met for TUG, but pt is actually safer at slower speeds.  STG #4 partially met, with wife providing supervision and cues for gait.  Pt will continue to benefit from further skilled PT to address safety with gait, transfers, functional strengthening and balance activities as appropriate.   Pt will benefit from skilled therapeutic intervention in order to improve on the following deficits Abnormal gait;Decreased balance;Decreased mobility;Decreased strength;Decreased safety awareness;Difficulty walking;Postural dysfunction   Rehab Potential Good   PT Frequency 2x / week   PT Duration 8 weeks  plus eval   PT Treatment/Interventions ADLs/Self Care Home Management;Therapeutic exercise;Therapeutic activities;Functional mobility training;Gait training;Balance training;Neuromuscular re-education;Patient/family education   PT Next Visit Plan Continue functional strengthening, use of machines, standing balance at counter as appropriate.   Consulted and Agree with Plan of Care Patient;Family member/caregiver   Family Member Consulted wife        Problem List Patient Active Problem List   Diagnosis Date Noted  . Lumbar stenosis 06/06/2015  . HOCM (hypertrophic obstructive cardiomyopathy) ([place]) 12/12/2014  . Infection of right prepatellar bursa 10/18/2014  . DOE (dyspnea on exertion) 09/06/2014  . Fatigue due to excessive exertion 09/06/2014  . Testosterone deficiency 11/12/2013  . Routine general medical examination at a health care facility 08/30/2013  . Mixed hyperlipidemia 08/30/2013  . Other abnormal glucose 08/30/2013  . 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 Parkinsonism ([place]) 01/16/2013  . INSOMNIA 06/06/2008  . GLAUCOMA 06/01/2008  . Essential hypertension 06/01/2008  . Seasonal and perennial allergic rhinitis 06/01/2008    Leiam Hopwood W. 09/22/2015, 10:11 AM  Frazier Butt., PT  [facility] [address] [place], [place], [address] Phone: [phone]   Fax:  [phone]  Name: [name] MRN: [id number] Date of Birth: 06-06-1946

## 2015-09-22 NOTE — Therapy (Signed)
[facility] [address] [place], [place], [address] Phone: [phone]   Fax:  [phone]  Physical Therapy Treatment  Patient Details  Name: [name] MRN: [id number] Date of Birth: April 16, 1946 Referring Provider: Rexene Alberts  Encounter Date: 09/21/2015      PT End of Session - 09/22/15 0948    Visit Number 5   Number of Visits 17   Date for PT Re-Evaluation 10/23/15   Authorization Type Medicare-G-code every 10th visit   PT Start Time 1450   PT Stop Time 1530   PT Time Calculation (min) 40 min   Equipment Utilized During Treatment Gait belt   Activity Tolerance Patient tolerated treatment well   Behavior During Therapy [facility] for tasks assessed/performed      Past Medical History  Diagnosis Date  . HTN (hypertension)   . Glaucoma   . Allergic rhinitis   . Parkinsonism ([place]) 01/16/2013  . Hyperlipidemia   . Thyroid disease     HYPOTHYROID  . Hypogonadism male   . Pre-diabetes   . Complication of anesthesia     Slow to awaken  . Dysphagia   . Hypothyroidism   . Constipation   . GERD (gastroesophageal reflux disease)     Decdreased, has decreased coffee intack.  . Arthritis   . DDD (degenerative disc disease), cervical   . Cancer ([place])     basal cell skin cancer  . OSA (obstructive sleep apnea)     Has had surgery x 2    Past Surgical History  Procedure Laterality Date  . Uvulopalatopharyngoplasty    . Lumbar disc surgery    . Cataract extraction    . I&d extremity Right 10/18/2014    Procedure: IRRIGATION AND DEBRIDEMENT RIGHT KNEE PREPATELLA BURSA INFECTION;  Surgeon: [name], MD;  Location: [facility];  Service: Orthopedics;  Laterality: Right;  . Tonsillectomy      and adenoids  . Nasal septum surgery    . Lumbar laminectomy with coflex 1 level N/A 06/06/2015    Procedure: Lumbar three-four Laminectomy/Foraminotomy with placement of Coflex;  Surgeon: [name], MD;  Location: [facility];   Service: Neurosurgery;  Laterality: N/A;    There were no vitals filed for this visit.  Visit Diagnosis:  Festinating gait  Abnormality of gait      Subjective Assessment - 09/21/15 1451    Subjective Fell yesterday when trying to stop and look at Christmas cards at home.  Did not get hurt.   Currently in Pain? No/denies         Gait:  Gait training with U-step RW again this session.  Daughter present today, and PT explains briefly the benefits of using U-step RW.  Pt ambulates with supervision/min guard assistance with U-step RW x 400 ft, then additional gait with starts/stops, turns around furniture and turns in tight spaces in gym area with min guard assistance.  Pt does experience freezing episodes during turns, with cues provided to stop and to march for improved initiation of gait and reduction of freezing during turns.  Briefly reiterated to daughter, tips to reduce freezing episodes with gait and turns.  Gait with U-step RW per pt request to front of building, x 200 ft, negotiating doorways and varying surfaces/thresholds.  Pt able to negotiate these without freezing, hesitation or LOB.    Timed UP and Go:  37.90 sec with U-step RW; 30.28 sec with rolling walker   Therapeutic Exercise: Repeated sit<>stand from 20 inch surface,  then 18 inch surface, x 10 reps each, with supervision/min guard, due to occasional episodes of posterior lean.  SciFit, Level 2, 5 minutes, 4 extremities, with cues for full stride length and to keep RPM >70.    Pt has questions about returning to [facility] for use of machines.  Explained that aerobic ex would likely be best with seated machines, with family assistance on and off machines.                        PT Short Term Goals - 08/28/15 2143    PT SHORT TERM GOAL #1   Title Pt will perform HEP for Parkinson's-specific deficits with wife's supervision/assistance.  TARGET 09/22/15   Time 4   Period Weeks   Status New   PT SHORT  TERM GOAL #2   Title Pt will perform at least 6 of 10 reps of sit<>stand transfers from 18-20 inch surfaces with UE support with no evidence of posterior lean, for improved safety and efficiency of transfers.   Time 4   Period Weeks   Status New   PT SHORT TERM GOAL #3   Title Pt will improve TUG score to less than or equal to 20 seconds for decreased fall risk.   Time 4   Period Weeks   Status New   PT SHORT TERM GOAL #4   Title Pt/wife will verbalize understanding of techniques to decrease festinating/freezing of gait.   Time 4   Period Weeks   Status New           PT Long Term Goals - 08/28/15 2154    PT LONG TERM GOAL #1   Title Pt/wife will verbalize understanding of fall prevention within the home.  TARGET 10/23/15   Time 8   Period Weeks   Status New   PT LONG TERM GOAL #2   Title Pt will improve Berg Balance score to at least 28/56 for decreased fall risk.   Time 8   Period Weeks   Status New   PT LONG TERM GOAL #3   Title Pt will improve gait velocity to at least 2.62 ft/sec for improved gait efficiency and safety.   Time 8   Period Weeks   Status New   PT LONG TERM GOAL #4   Title Pt will perform floor>stand transfers with minimal assistance and UE support for safe fall recovery.   Time 8   Period Weeks   Status New   PT LONG TERM GOAL #5   Title Pt will verbalize plans for continued community fitness upon D/C from PT.   Time 8   Period Weeks   Status New               Plan - 09/22/15 0948    Clinical Impression Statement Pt performs better today with Timed Up and Go when using his rolling walker.  He appears to have less significant freezing episodes with RW than with U-step RW with turning.  Pt will continue to benefit from further skilled PT to address functional strengthening, balance activities.   Pt will benefit from skilled therapeutic intervention in order to improve on the following deficits Abnormal gait;Decreased balance;Decreased  mobility;Decreased strength;Decreased safety awareness;Difficulty walking;Postural dysfunction   Rehab Potential Good   PT Frequency 2x / week   PT Duration 8 weeks  plus eval   PT Treatment/Interventions ADLs/Self Care Home Management;Therapeutic exercise;Therapeutic activities;Functional mobility training;Gait training;Balance training;Neuromuscular re-education;Patient/family education   PT Next  Visit Plan Have discussion regarding U-step with pt/wife; continue sit<>stand, begin checking goals    Consulted and Agree with Plan of Care Patient;Family member/caregiver   Family Member Consulted wife        Problem List Patient Active Problem List   Diagnosis Date Noted  . Lumbar stenosis 06/06/2015  . HOCM (hypertrophic obstructive cardiomyopathy) ([place]) 12/12/2014  . Infection of right prepatellar bursa 10/18/2014  . DOE (dyspnea on exertion) 09/06/2014  . Fatigue due to excessive exertion 09/06/2014  . Testosterone deficiency 11/12/2013  . Routine general medical examination at a health care facility 08/30/2013  . Mixed hyperlipidemia 08/30/2013  . Other abnormal glucose 08/30/2013  . 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 Parkinsonism ([place]) 01/16/2013  . INSOMNIA 06/06/2008  . GLAUCOMA 06/01/2008  . Essential hypertension 06/01/2008  . Seasonal and perennial allergic rhinitis 06/01/2008    Blyss Lugar W. 09/22/2015, 9:51 AM  Frazier Butt., PT  [facility] [address] [place], [place], [address] Phone: [phone]   Fax:  [phone]  Name: [name] MRN: [id number] Date of Birth: 28-Oct-1945

## 2015-09-22 NOTE — Therapy (Signed)
[name] [address] [place], [place], [address] Phone: [phone]   Fax:  [phone]  Speech Language Pathology Treatment  Patient Details  Name: [name] MRN: [id number] Date of Birth: Feb 21, 1946 No Data Recorded  Encounter Date: 09/22/2015      End of Session - 09/22/15 1210    Visit Number 9   Number of Visits 17   Date for SLP Re-Evaluation 10/10/15   SLP Start Time 1104   SLP Stop Time  1146   SLP Time Calculation (min) 42 min   Activity Tolerance Patient tolerated treatment well      Past Medical History  Diagnosis Date  . HTN (hypertension)   . Glaucoma   . Allergic rhinitis   . Parkinsonism ([place]) 01/16/2013  . Hyperlipidemia   . Thyroid disease     HYPOTHYROID  . Hypogonadism male   . Pre-diabetes   . Complication of anesthesia     Slow to awaken  . Dysphagia   . Hypothyroidism   . Constipation   . GERD (gastroesophageal reflux disease)     Decdreased, has decreased coffee intack.  . Arthritis   . DDD (degenerative disc disease), cervical   . Cancer ([place])     basal cell skin cancer  . OSA (obstructive sleep apnea)     Has had surgery x 2    Past Surgical History  Procedure Laterality Date  . Uvulopalatopharyngoplasty    . Lumbar disc surgery    . Cataract extraction    . I&d extremity Right 10/18/2014    Procedure: IRRIGATION AND DEBRIDEMENT RIGHT KNEE PREPATELLA BURSA INFECTION;  Surgeon: [name], MD;  Location: [facility];  Service: Orthopedics;  Laterality: Right;  . Tonsillectomy      and adenoids  . Nasal septum surgery    . Lumbar laminectomy with coflex 1 level N/A 06/06/2015    Procedure: Lumbar three-four Laminectomy/Foraminotomy with placement of Coflex;  Surgeon: [name], MD;  Location: [facility];  Service: Neurosurgery;  Laterality: N/A;    There were no vitals filed for this visit.  Visit Diagnosis: Oropharyngeal dysphagia  Dysarthria       Subjective Assessment - 09/22/15 1111    Currently in Pain? No/denies               ADULT SLP TREATMENT - 09/22/15 1111    General Information   Behavior/Cognition Alert;Pleasant mood;Cooperative   Treatment Provided   Treatment provided Cognitive-Linquistic   Dysphagia Treatment   Other treatment/comments home management: Reviewed swallowing strategies. Pt noted to cough immediately after liquid sips today with stifled cough - I instructed pt not to hold in his cough, but cough hard. I also reviwed that pt needs to put his spoon down in between bites to slow his rate of eating. Spouse reports he continues to eat and drink, even while he is coughing. Swallow precautions reviewed   Pain Assessment   Pain Assessment No/denies pain   Cognitive-Linquistic Treatment   Treatment focused on Dysarthria   Skilled Treatment Pt utilized metronome for structured task sentence generation with 90% accuracy, requiring min cues to slow for multisyllabic words, 95% intelligible. Simple conversation re: family with metronome to facilitate slow rate. Pt with improved eye contact with cues during conversation. Again 95% intellgibile with min cues to  reduce rate of multisyllabic word. At the end of the session, pt placed metronome in his shirt pocket. Using only auditory beats, pt maintained slow rate of speech  successfully. I instructed him to practice with the metronome with his family and granchildren. ST modeled slow rate for spouse and instructed her to slow her rate of speech to help Mr. Goldberg carryover slow rate outside of therapy.   Assessment / Recommendations / Plan   Plan Continue with current plan of care   Progression Toward Goals   Progression toward goals Progressing toward goals          SLP Education - 09/22/15 1203    Education provided Yes   Education Details compensations to reduce rate of self feeding   Person(s) Educated Patient;Spouse   Methods Explanation;Verbal  cues;Demonstration   Comprehension Verbalized understanding          SLP Short Term Goals - 09/22/15 1209    SLP SHORT TERM GOAL #1   Title pt will perform HEP with occasional min A   Period Weeks   Status Achieved   SLP SHORT TERM GOAL #2   Title pt will demo swallow precautions with POs to remain safe during meals with occasional min A   Time 1   Period Weeks   Status On-going          SLP Long Term Goals - 09/22/15 1209    SLP LONG TERM GOAL #1   Title pt will perform HEP to improve oropharyneal swallowing ability with rare min A   Time 4   Period Weeks   Status On-going   SLP LONG TERM GOAL #2   Title pt will follow swallow precautions with rare mod A   Time 4   Period Weeks   Status On-going   SLP LONG TERM GOAL #3   Title wife/caregiver will assist patient in completing HEP with modified independence (use of HEP form)   Time 4   Period Weeks   Status On-going   SLP LONG TERM GOAL #4   Title pt will demo 75% intelligiblity with spontaneous sentence responses   Time 4   Period Weeks   Status On-going          Plan - 09/22/15 1207    Clinical Impression Statement Pt utlized metronome for reduced rate and 90% intellgibilty on structured tasks and in simple conversation with occasional min A. He required cues to verbalize swallow precautions and to cough hard when he needed to cough with water sips today. Continue skilled ST to maximize intelligibilty and  safety of swallow.   Speech Therapy Frequency 2x / week   Treatment/Interventions Aspiration precaution training;Pharyngeal strengthening exercises;Diet toleration management by SLP;Compensatory techniques;Environmental controls   Potential to Achieve Goals Fair   Potential Considerations Previous level of function;Severity of impairments   SLP Home Exercise Plan provided today   Consulted and Agree with Plan of Care Patient        Problem List Patient Active Problem List   Diagnosis Date Noted  .  Lumbar stenosis 06/06/2015  . HOCM (hypertrophic obstructive cardiomyopathy) ([place]) 12/12/2014  . Infection of right prepatellar bursa 10/18/2014  . DOE (dyspnea on exertion) 09/06/2014  . Fatigue due to excessive exertion 09/06/2014  . Testosterone deficiency 11/12/2013  . Routine general medical examination at a health care facility 08/30/2013  . Mixed hyperlipidemia 08/30/2013  . Other abnormal glucose 08/30/2013  . 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 Parkinsonism ([place]) 01/16/2013  . INSOMNIA 06/06/2008  . GLAUCOMA 06/01/2008  . Essential hypertension 06/01/2008  . Seasonal and perennial allergic rhinitis 06/01/2008    Lovvorn, Annye Rusk MS, CCC-SLP 09/22/2015, 12:14 PM  [facility] [address] [place], [place], [address] Phone: [phone]   Fax:  [phone]   Name: Corderio Halfacre MRN: [id number] Date of Birth: June 26, 1946

## 2015-09-26 ENCOUNTER — Encounter: Payer: Medicare Other | Admitting: Occupational Therapy

## 2015-09-27 ENCOUNTER — Ambulatory Visit: Payer: Medicare Other | Admitting: Physical Therapy

## 2015-09-27 ENCOUNTER — Ambulatory Visit: Payer: Medicare Other

## 2015-09-27 ENCOUNTER — Ambulatory Visit: Payer: Medicare Other | Admitting: Occupational Therapy

## 2015-09-27 DIAGNOSIS — R1312 Dysphagia, oropharyngeal phase: Secondary | ICD-10-CM

## 2015-09-27 DIAGNOSIS — R29898 Other symptoms and signs involving the musculoskeletal system: Secondary | ICD-10-CM | POA: Diagnosis not present

## 2015-09-27 DIAGNOSIS — R2681 Unsteadiness on feet: Secondary | ICD-10-CM

## 2015-09-27 DIAGNOSIS — R4189 Other symptoms and signs involving cognitive functions and awareness: Secondary | ICD-10-CM | POA: Diagnosis not present

## 2015-09-27 DIAGNOSIS — R2689 Other abnormalities of gait and mobility: Secondary | ICD-10-CM | POA: Diagnosis not present

## 2015-09-27 DIAGNOSIS — R269 Unspecified abnormalities of gait and mobility: Secondary | ICD-10-CM | POA: Diagnosis not present

## 2015-09-27 DIAGNOSIS — R279 Unspecified lack of coordination: Secondary | ICD-10-CM | POA: Diagnosis not present

## 2015-09-27 DIAGNOSIS — R471 Dysarthria and anarthria: Secondary | ICD-10-CM

## 2015-09-27 DIAGNOSIS — R6889 Other general symptoms and signs: Secondary | ICD-10-CM

## 2015-09-27 DIAGNOSIS — R278 Other lack of coordination: Secondary | ICD-10-CM

## 2015-09-27 NOTE — Therapy (Signed)
[facility] [address] [place], [place], [address] Phone: [phone]   Fax:  [phone]  Occupational Therapy Treatment  Patient Details  Name: [name] MRN: [id number] Date of Birth: 08-04-1946 Referring Provider: Dr. Guadelupe Sabin  Encounter Date: 09/27/2015      OT End of Session - 09/27/15 1311    Visit Number 7   Number of Visits 17   Date for OT Re-Evaluation 10/23/15   Authorization Type Medicare primary, BCBS secondary--need G-code   Authorization - Visit Number 7   Authorization - Number of Visits 10   OT Start Time 1148   OT Stop Time 1230   OT Time Calculation (min) 42 min   Activity Tolerance Patient tolerated treatment well   Behavior During Therapy Impulsive  at times      Past Medical History  Diagnosis Date  . HTN (hypertension)   . Glaucoma   . Allergic rhinitis   . Parkinsonism ([place]) 01/16/2013  . Hyperlipidemia   . Thyroid disease     HYPOTHYROID  . Hypogonadism male   . Pre-diabetes   . Complication of anesthesia     Slow to awaken  . Dysphagia   . Hypothyroidism   . Constipation   . GERD (gastroesophageal reflux disease)     Decdreased, has decreased coffee intack.  . Arthritis   . DDD (degenerative disc disease), cervical   . Cancer ([place])     basal cell skin cancer  . OSA (obstructive sleep apnea)     Has had surgery x 2    Past Surgical History  Procedure Laterality Date  . Uvulopalatopharyngoplasty    . Lumbar disc surgery    . Cataract extraction    . I&d extremity Right 10/18/2014    Procedure: IRRIGATION AND DEBRIDEMENT RIGHT KNEE PREPATELLA BURSA INFECTION;  Surgeon: [name], MD;  Location: [facility];  Service: Orthopedics;  Laterality: Right;  . Tonsillectomy      and adenoids  . Nasal septum surgery    . Lumbar laminectomy with coflex 1 level N/A 06/06/2015    Procedure: Lumbar three-four Laminectomy/Foraminotomy with placement of Coflex;  Surgeon: [name], MD;  Location: [facility];  Service: Neurosurgery;  Laterality: N/A;    There were no vitals filed for this visit.  Visit Diagnosis:  Decreased functional mobility  Hypokinesia  Decreased coordination  Unsteadiness  Rigidity  Cognitive deficits      Subjective Assessment - 09/27/15 1250    Subjective  Pt reports that he feels like brushing hair has been "terrible"   Patient is accompained by: Family member  daughter, grandchildren   Patient Stated Goals Per pt/wife:  improve writing, brushing hair, picking up cups, brushing teeth, washing hair, pulling down shirt, cutting food.                      OT Treatments/Exercises (OP) - 09/27/15 0001    ADLs   Grooming Practiced brushing hair with mod cues for slow/big movements.   Bathing Practiced washing hands with mod cues (verbal/tactile) for large amplitude movements.  Simulated washing hair with mod-max verbal/tactile cues for big movements.    Functional Mobility Practiced sit>stand transfer in functional context during session with use of strategies.   Writing Practiced writing using big movements.  Pt wrote signature with good legibility and no significant decr in size.  Then practiced adress with mod decr in size, approx 75% legibility, min-mod cues.  Then writing  on calendar with max cues for size/timing, use of PWR! hands.                  OT Education - 09/27/15 1256    Education Details use of PWR! hands, stop/re-start if pt's movement gets small/freezes, use of big/deliberate movements   Person(s) Educated Patient;Child(ren)  and grandchildren   Methods Explanation;Demonstration;Verbal cues   Comprehension Verbalized understanding          OT Short Term Goals - 09/27/15 1313    OT SHORT TERM GOAL #1   Title Pt will perform updated HEP with supervision/min cues.--check STGs 09/22/15   Time 4   Period Weeks   Status New   OT SHORT TERM GOAL #2   Title Pt will write name with  at least 75% legibility.   Time 4   Period Weeks   Status Achieved  09/27/15   OT SHORT TERM GOAL #3   Title Pt will report incr ease with washing hair.   Time 4   Period Weeks   Status On-going  09/27/15  inconsistent performance   OT SHORT TERM GOAL #4   Title Pt will pick up cup using big amplitude movements in 5/10 attempts.   Time 4   Period Weeks   Status New   OT SHORT TERM GOAL #5   Title Pt will report incr ease with brushing teeth.   Time 4   Period Weeks   Status New           OT Long Term Goals - 08/25/15 2141    OT LONG TERM GOAL #1   Title Pt will verbalize understanding of adaptive strategies/AE for ADLs prn.--check LTGs 10/23/15   Time 8   Period Weeks   Status New   OT LONG TERM GOAL #2   Title Pt will be able to don shirt using big amplitude movement strategies.   Time 8   Period Weeks   Status New   OT LONG TERM GOAL #3   Title Pt will be able to write name and simple phrase with at least 90% legibility.   Time 8   Period Weeks   Status New   OT LONG TERM GOAL #4   Title Pt will pick up cup using big amplitude movements in 8/10 attempts.   Time 8   Period Weeks   Status New   OT LONG TERM GOAL #5   Title Pt will be able to brush hair with min A.   Time 8   Period Weeks   Status New   Long Term Additional Goals   Additional Long Term Goals Yes   OT LONG TERM GOAL #6   Title Pt will be able to cut food mod I using AE/stratgies prn.   Time 8   Period Weeks   Status New   OT LONG TERM GOAL #7   Title Pt will improve coordination for ADLs as shown by improving time on 9-hole peg test by at [facility] with dominant LUE.   Baseline 46.31sec   Time 8   Period Weeks   Status New               Plan - 09/27/15 1312    Clinical Impression Statement Pt demo improved ability to sign name today, but demo incr difficulty with brushing hair.   Plan check remaining STGs, check schedule prn   OT Home Exercise Plan Education issued:  HEP  for functional activities with focus  on timing/big amplitude movements   Consulted and Agree with Plan of Care Patient;Family member/caregiver   Family Member Consulted daughter        Problem List Patient Active Problem List   Diagnosis Date Noted  . Lumbar stenosis 06/06/2015  . HOCM (hypertrophic obstructive cardiomyopathy) ([place]) 12/12/2014  . Infection of right prepatellar bursa 10/18/2014  . DOE (dyspnea on exertion) 09/06/2014  . Fatigue due to excessive exertion 09/06/2014  . Testosterone deficiency 11/12/2013  . Routine general medical examination at a health care facility 08/30/2013  . Mixed hyperlipidemia 08/30/2013  . Other abnormal glucose 08/30/2013  . 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 Parkinsonism ([place]) 01/16/2013  . INSOMNIA 06/06/2008  . GLAUCOMA 06/01/2008  . Essential hypertension 06/01/2008  . Seasonal and perennial allergic rhinitis 06/01/2008    [facility] 09/27/2015, 1:16 PM  [facility] [address] [place], [place], [address] Phone: [phone]   Fax:  [phone]  Name: [name] MRN: [id number] Date of Birth: 09-30-1946  Vianne Bulls, OTR/L 09/27/2015 1:16 PM

## 2015-09-27 NOTE — Therapy (Signed)
[facility] [address] [place], [place], [address] Phone: [phone]   Fax:  [phone]  Physical Therapy Treatment  Patient Details  Name: [name] MRN: [id number] Date of Birth: May 15, 1946 Referring Provider: Rexene Alberts  Encounter Date: 09/27/2015      PT End of Session - 09/27/15 1854    Visit Number 7   Number of Visits 17   Date for PT Re-Evaluation 10/23/15   Authorization Type Medicare-G-code every 10th visit   PT Start Time 1104   PT Stop Time 1145   PT Time Calculation (min) 41 min   Equipment Utilized During Treatment Gait belt   Activity Tolerance Patient tolerated treatment well   Behavior During Therapy Impulsive  needs cues to slow down with all tasks      Past Medical History  Diagnosis Date  . HTN (hypertension)   . Glaucoma   . Allergic rhinitis   . Parkinsonism ([place]) 01/16/2013  . Hyperlipidemia   . Thyroid disease     HYPOTHYROID  . Hypogonadism male   . Pre-diabetes   . Complication of anesthesia     Slow to awaken  . Dysphagia   . Hypothyroidism   . Constipation   . GERD (gastroesophageal reflux disease)     Decdreased, has decreased coffee intack.  . Arthritis   . DDD (degenerative disc disease), cervical   . Cancer ([place])     basal cell skin cancer  . OSA (obstructive sleep apnea)     Has had surgery x 2    Past Surgical History  Procedure Laterality Date  . Uvulopalatopharyngoplasty    . Lumbar disc surgery    . Cataract extraction    . I&d extremity Right 10/18/2014    Procedure: IRRIGATION AND DEBRIDEMENT RIGHT KNEE PREPATELLA BURSA INFECTION;  Surgeon: [name], MD;  Location: [facility];  Service: Orthopedics;  Laterality: Right;  . Tonsillectomy      and adenoids  . Nasal septum surgery    . Lumbar laminectomy with coflex 1 level N/A 06/06/2015    Procedure: Lumbar three-four Laminectomy/Foraminotomy with placement of Coflex;  Surgeon: [name], MD;   Location: [facility];  Service: Neurosurgery;  Laterality: N/A;    There were no vitals filed for this visit.  Visit Diagnosis:  Abnormality of gait  Decreased functional mobility      Subjective Assessment - 09/27/15 1851    Subjective Denies falls or changes since last visit.  Daughter and grandchildren present today with patient.  Pt still trying to encourage PTA to allow pt to use 4 wheeled RW.   Patient is accompained by: Family member   Pertinent History Back surgery August 2016; spinal lesion C6 (on MRI-MD not planning to do further work-up)   Patient Stated Goals Pt's goal for therapy is to improve balance and walking   Currently in Pain? No/denies     Scifit level 2.2 all 4 extremities x 10 minutes with verbal cues and occasional tactile cues to encourage full ROM on machine.  Pt tends to do faster, small ROM.  Rpm>80 throughout. Sit<>stand x 10 x 2 for strengthening and functional mobility encouraging forward lean and controling descent. Standing at counter for forward step weight shift, back step weight shift and forward<>backward step weight shifting.  Uses 1 UE support and moderate verbal and tactile cues for technique. Standing facing counter for lateral weight shifting using targets on upper counter to encourage weight shift. Pt needing min-moderate cues for  transfers to/from surfaces during treatment session. Standing at [place] and back of chair performing quarter turns with UE assist and cues to complete in only one step, increasing BOS and weight shifting.  Pt needed several standing rest breaks during session.         PT Education - 09/27/15 1852    Education provided Yes   Education Details Reinforced recommendation by PT not to use 4 wheeled RW and fall risk associated with it;only using recumbent bike at [facility] with family present and to assist on/off of bike   Person(s) Educated Patient;Child(ren);Other (comment)  grandchildrent   Methods Explanation    Comprehension Verbalized understanding          PT Short Term Goals - 09/22/15 1005    PT SHORT TERM GOAL #1   Title Pt will perform HEP for Parkinson's-specific deficits with wife's supervision/assistance.  TARGET 09/22/15   Baseline per wife's report-sit<>stand transfers for HEP   Time 4   Period Weeks   Status Achieved   PT SHORT TERM GOAL #2   Title Pt will perform at least 6 of 10 reps of sit<>stand transfers from 18-20 inch surfaces with UE support with no evidence of posterior lean, for improved safety and efficiency of transfers.   Time 4   Period Weeks   Status Achieved   PT SHORT TERM GOAL #3   Title Pt will improve TUG score to less than or equal to 20 seconds for decreased fall risk.   Time 4   Period Weeks   Status Not Met   PT SHORT TERM GOAL #4   Title Pt/wife will verbalize understanding of techniques to decrease festinating/freezing of gait.   Baseline Wife verbalizes understanding and provides cues for pt.   Time 4   Period Weeks   Status Partially Met           PT Long Term Goals - 08/28/15 2154    PT LONG TERM GOAL #1   Title Pt/wife will verbalize understanding of fall prevention within the home.  TARGET 10/23/15   Time 8   Period Weeks   Status New   PT LONG TERM GOAL #2   Title Pt will improve Berg Balance score to at least 28/56 for decreased fall risk.   Time 8   Period Weeks   Status New   PT LONG TERM GOAL #3   Title Pt will improve gait velocity to at least 2.62 ft/sec for improved gait efficiency and safety.   Time 8   Period Weeks   Status New   PT LONG TERM GOAL #4   Title Pt will perform floor>stand transfers with minimal assistance and UE support for safe fall recovery.   Time 8   Period Weeks   Status New   PT LONG TERM GOAL #5   Title Pt will verbalize plans for continued community fitness upon D/C from PT.   Time 8   Period Weeks   Status New               Plan - 09/27/15 1855    Clinical Impression Statement  Pt continues to need cues for safety with gait, transfers and HEP.  Reinforced recommendation again of not using 4-wheeled RW and safety concerns along with only using recumbent bike at [facility] with family's assistance.  Continue PT per POC.   Pt will benefit from skilled therapeutic intervention in order to improve on the following deficits Abnormal gait;Decreased balance;Decreased mobility;Decreased strength;Decreased safety awareness;Difficulty walking;Postural  dysfunction   Rehab Potential Good   PT Frequency 2x / week   PT Duration 8 weeks  plus eval   PT Treatment/Interventions ADLs/Self Care Home Management;Therapeutic exercise;Therapeutic activities;Functional mobility training;Gait training;Balance training;Neuromuscular re-education;Patient/family education   PT Next Visit Plan Continue functional strengthening, use of machines, standing balance at counter as appropriate.   Consulted and Agree with Plan of Care Patient;Family member/caregiver   Family Member Consulted daughter and grandchildren        Problem List Patient Active Problem List   Diagnosis Date Noted  . Lumbar stenosis 06/06/2015  . HOCM (hypertrophic obstructive cardiomyopathy) ([place]) 12/12/2014  . Infection of right prepatellar bursa 10/18/2014  . DOE (dyspnea on exertion) 09/06/2014  . Fatigue due to excessive exertion 09/06/2014  . Testosterone deficiency 11/12/2013  . Routine general medical examination at a health care facility 08/30/2013  . Mixed hyperlipidemia 08/30/2013  . Other abnormal glucose 08/30/2013  . 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 Parkinsonism ([place]) 01/16/2013  . INSOMNIA 06/06/2008  . GLAUCOMA 06/01/2008  . Essential hypertension 06/01/2008  . Seasonal and perennial allergic rhinitis 06/01/2008    Narda Bonds 09/27/2015, 6:58 PM  [facility] [address] [place], [place], [address] Phone: [phone]    Fax:  [phone]  Name: Mykell Rawl MRN: [id number] Date of Birth: 1946/05/26    Narda Bonds, [place] 09/27/2015 6:58 PM Phone: [phone] Fax: [phone]

## 2015-09-27 NOTE — Therapy (Signed)
[facility] [address] [place], [place], [address] Phone: [phone]   Fax:  [phone]  Speech Language Pathology Treatment  Patient Details  Name: [name] MRN: [id number] Date of Birth: 1946-01-06 No Data Recorded  Encounter Date: 09/27/2015      End of Session - 09/27/15 1649    Visit Number 10   Number of Visits 17   Date for SLP Re-Evaluation 10/10/15   SLP Start Time 6256   SLP Stop Time  1400   SLP Time Calculation (min) 43 min   Activity Tolerance Patient tolerated treatment well      Past Medical History  Diagnosis Date  . HTN (hypertension)   . Glaucoma   . Allergic rhinitis   . Parkinsonism ([place]) 01/16/2013  . Hyperlipidemia   . Thyroid disease     HYPOTHYROID  . Hypogonadism male   . Pre-diabetes   . Complication of anesthesia     Slow to awaken  . Dysphagia   . Hypothyroidism   . Constipation   . GERD (gastroesophageal reflux disease)     Decdreased, has decreased coffee intack.  . Arthritis   . DDD (degenerative disc disease), cervical   . Cancer ([place])     basal cell skin cancer  . OSA (obstructive sleep apnea)     Has had surgery x 2    Past Surgical History  Procedure Laterality Date  . Uvulopalatopharyngoplasty    . Lumbar disc surgery    . Cataract extraction    . I&d extremity Right 10/18/2014    Procedure: IRRIGATION AND DEBRIDEMENT RIGHT KNEE PREPATELLA BURSA INFECTION;  Surgeon: [name], MD;  Location: [facility];  Service: Orthopedics;  Laterality: Right;  . Tonsillectomy      and adenoids  . Nasal septum surgery    . Lumbar laminectomy with coflex 1 level N/A 06/06/2015    Procedure: Lumbar three-four Laminectomy/Foraminotomy with placement of Coflex;  Surgeon: [name], MD;  Location: [facility];  Service: Neurosurgery;  Laterality: N/A;    There were no vitals filed for this visit.  Visit Diagnosis: Dysarthria  Oropharyngeal dysphagia       Subjective Assessment - 09/27/15 1323    Patient is accompained by: --  grandchildren and daughter               ADULT SLP TREATMENT - 09/27/15 1323    General Information   Behavior/Cognition Alert;Pleasant mood;Cooperative   Treatment Provided   Treatment provided Cognitive-Linquistic   Dysphagia Treatment   Temperature Spikes Noted No   Treatment Methods Therapeutic exercise;Compensation strategy training;Patient/caregiver education;Skilled observation   Patient observed directly with PO's Yes   Type of PO's observed Thin liquids   Pharyngeal Phase Signs & Symptoms Immediate throat clear;Immediate cough   Other treatment/comments Pt performed HEP with rare min A from SLP.    Pain Assessment   Pain Assessment No/denies pain   Cognitive-Linquistic Treatment   Treatment focused on Dysarthria   Skilled Treatment Pt utilized netronome with structured sentence tasks and 95% intelligibility - speech was functional at this level. Without metronome, pt noted to speed up his speech but cont as functional with extra listener time and mild focused listening.   Assessment / Recommendations / Plan   Plan Continue with current plan of care   Dysphagia Recommendations   Diet recommendations Dysphagia 3 (mechanical soft);Thin liquid   Compensations Slow rate  and those rec on modified barium swallow exam   Progression  Toward Goals   Progression toward goals Progressing toward goals          SLP Education - 10-18-2015 1647    Education provided Yes   Education Details genesis of hydrophonic voice, need to clear throat/reswallow when pt notices   Person(s) Educated Patient;Child(ren);Other (comment)  grandchildren   Methods Explanation;Other (comment)  phayrngeal/airway model   Comprehension Verbalized understanding;Returned demonstration;Need further instruction;Verbal cues required  for stronger throat clear          SLP Short Term Goals - 2015-10-18 1650    SLP SHORT TERM  GOAL #1   Title pt will perform HEP with occasional min A   Period Weeks   Status Achieved   SLP SHORT TERM GOAL #2   Title pt will demo swallow precautions with POs to remain safe during meals with occasional min A   Time 1   Period Weeks   Status Not Met          SLP Long Term Goals - 2015/10/18 1650    SLP LONG TERM GOAL #1   Title pt will perform HEP to improve oropharyneal swallowing ability with rare min A   Time 3   Period Weeks   Status On-going   SLP LONG TERM GOAL #2   Title pt will follow swallow precautions with rare mod A   Time 3   Period Weeks   Status On-going   SLP LONG TERM GOAL #3   Title wife/caregiver will assist patient in completing HEP with modified independence (use of HEP form)   Time 3   Period Weeks   Status On-going   SLP LONG TERM GOAL #4   Title pt will demo 75% intelligiblity with spontaneous sentence responses   Time 3   Period Weeks   Status On-going          Plan - 10-18-2015 1649    Clinical Impression Statement Pt utlized metronome for reduced rate and 95% intellgibilty on structured tasks and in simple conversation. Without metronome pt was still functional but focused listening needed.  Continue skilled ST to maximize intelligibilty and  safety of swallow.   Speech Therapy Frequency 2x / week   Duration --  3 weeks   Treatment/Interventions Aspiration precaution training;Pharyngeal strengthening exercises;Diet toleration management by SLP;Compensatory techniques;Environmental controls   Potential to Achieve Goals Fair   Potential Considerations Previous level of function;Severity of impairments   Consulted and Agree with Plan of Care Patient          G-Codes - 2015/10/18 1652    Functional Assessment Tool Used MBSS report   Functional Limitations Swallowing   Swallow Current Status (O9629) At least 20 percent but less than 40 percent impaired, limited or restricted   Swallow Goal Status (B2841) At least 20 percent but less  than 40 percent impaired, limited or restricted     Speech Therapy Progress Note  Dates of Reporting Period: 08-11-15 to present  Objective Reports of Subjective Statement: Pt has a HEP developed for him based upon his modified barium swallow eval results. He cont to require assistance with completion due to decr'd attention and cognitive skills.  Objective Measurements: Pt completes HEP with varying cue levels, from min to mod, in all therapy sessions to date.  Goal Update: see above  Plan: continue for at least two more weeks. Pt will possibly require therapy further than that time frame.  Reason Skilled Services are Required: Pt cont to require cues from SLP for reduced speech rate  as well as assistance with HEP.    Problem List Patient Active Problem List   Diagnosis Date Noted  . Lumbar stenosis 06/06/2015  . HOCM (hypertrophic obstructive cardiomyopathy) ([place]) 12/12/2014  . Infection of right prepatellar bursa 10/18/2014  . DOE (dyspnea on exertion) 09/06/2014  . Fatigue due to excessive exertion 09/06/2014  . Testosterone deficiency 11/12/2013  . Routine general medical examination at a health care facility 08/30/2013  . Mixed hyperlipidemia 08/30/2013  . Other abnormal glucose 08/30/2013  . 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 Parkinsonism ([place]) 01/16/2013  . INSOMNIA 06/06/2008  . GLAUCOMA 06/01/2008  . Essential hypertension 06/01/2008  . Seasonal and perennial allergic rhinitis 06/01/2008    [facility] , MS, CCC-SLP 09/27/2015, 4:52 PM  [facility] [address] [place], [place], [address] Phone: [phone]   Fax:  [phone]   Name: [name] MRN: [id number] Date of Birth: November 05, 1945

## 2015-09-28 ENCOUNTER — Ambulatory Visit: Payer: Medicare Other

## 2015-09-28 ENCOUNTER — Ambulatory Visit: Payer: Medicare Other | Admitting: Physical Therapy

## 2015-09-28 ENCOUNTER — Ambulatory Visit: Payer: Medicare Other | Admitting: Occupational Therapy

## 2015-09-28 DIAGNOSIS — R269 Unspecified abnormalities of gait and mobility: Secondary | ICD-10-CM | POA: Diagnosis not present

## 2015-09-28 DIAGNOSIS — R6889 Other general symptoms and signs: Secondary | ICD-10-CM | POA: Diagnosis not present

## 2015-09-28 DIAGNOSIS — R4189 Other symptoms and signs involving cognitive functions and awareness: Secondary | ICD-10-CM

## 2015-09-28 DIAGNOSIS — R2689 Other abnormalities of gait and mobility: Secondary | ICD-10-CM

## 2015-09-28 DIAGNOSIS — R471 Dysarthria and anarthria: Secondary | ICD-10-CM

## 2015-09-28 DIAGNOSIS — R29898 Other symptoms and signs involving the musculoskeletal system: Secondary | ICD-10-CM

## 2015-09-28 DIAGNOSIS — R279 Unspecified lack of coordination: Secondary | ICD-10-CM | POA: Diagnosis not present

## 2015-09-28 DIAGNOSIS — R1312 Dysphagia, oropharyngeal phase: Secondary | ICD-10-CM

## 2015-09-28 DIAGNOSIS — R278 Other lack of coordination: Secondary | ICD-10-CM

## 2015-09-28 DIAGNOSIS — R258 Other abnormal involuntary movements: Secondary | ICD-10-CM

## 2015-09-28 NOTE — Therapy (Signed)
[facility] [address] [place], [place], [address] Phone: [phone]   Fax:  [phone]  Physical Therapy Treatment  Patient Details  Name: [name] MRN: [id number] Date of Birth: 09-16-1946 Referring Provider: Rexene Alberts  Encounter Date: 09/28/2015      PT End of Session - 09/28/15 1242    Visit Number 8   Number of Visits 17   Date for PT Re-Evaluation 10/23/15   Authorization Type Medicare-G-code every 10th visit   PT Start Time 1148   PT Stop Time 1230   PT Time Calculation (min) 42 min   Equipment Utilized During Treatment Gait belt   Activity Tolerance Patient tolerated treatment well   Behavior During Therapy [facility] for tasks assessed/performed      Past Medical History  Diagnosis Date  . HTN (hypertension)   . Glaucoma   . Allergic rhinitis   . Parkinsonism ([place]) 01/16/2013  . Hyperlipidemia   . Thyroid disease     HYPOTHYROID  . Hypogonadism male   . Pre-diabetes   . Complication of anesthesia     Slow to awaken  . Dysphagia   . Hypothyroidism   . Constipation   . GERD (gastroesophageal reflux disease)     Decdreased, has decreased coffee intack.  . Arthritis   . DDD (degenerative disc disease), cervical   . Cancer ([place])     basal cell skin cancer  . OSA (obstructive sleep apnea)     Has had surgery x 2    Past Surgical History  Procedure Laterality Date  . Uvulopalatopharyngoplasty    . Lumbar disc surgery    . Cataract extraction    . I&d extremity Right 10/18/2014    Procedure: IRRIGATION AND DEBRIDEMENT RIGHT KNEE PREPATELLA BURSA INFECTION;  Surgeon: [name], MD;  Location: [facility];  Service: Orthopedics;  Laterality: Right;  . Tonsillectomy      and adenoids  . Nasal septum surgery    . Lumbar laminectomy with coflex 1 level N/A 06/06/2015    Procedure: Lumbar three-four Laminectomy/Foraminotomy with placement of Coflex;  Surgeon: [name], MD;  Location: [facility];   Service: Neurosurgery;  Laterality: N/A;    There were no vitals filed for this visit.  Visit Diagnosis:  Festinating gait  Bradykinesia  Abnormality of gait      Subjective Assessment - 09/28/15 1150    Subjective Tried the 4-wheeled RW at [employer] last night   Currently in Pain? No/denies             -Pt wearing pocket metronome from speech therapy (to aid in pacing of speech).  PT cues patient to use metronome for initiation of exercises.   - Sit<>stand x 10 x 2 (from 22" height, then from 18 inch height)for strengthening and functional mobility encouraging forward lean and controlling descent.  Sit<>stand multiple reps through session, with side step and short turn transfers to sit using RW, with chairs/mat in round-robin style of sit<>stand and turning walker to sit, with supervision and verbal cues for technique.  Pt has increased difficulty with initiating turns to sit at left.  Standing at counter for forward step weight shift x 10 reps, then forward<>backward step weight shifting x 10 reps. Uses 1 UE support and moderate verbal and tactile cues for technique. Standing facing counter for lateral weight shifting using targets on upper counter to encourage weight shift. Pt needing min-moderate cues for transfers to/from surfaces during treatment session.  -Figure-8 turns using  RW around furniture, initiating turn to the R and L, two reps each, with close supervision/min guard to negotiate around furniture and over carpet threshold.  Pt needed several standing rest breaks during session and c/o leg fatigue last half of session.         [facility] Adult PT Treatment/Exercise - 09/28/15 0001    Exercises   Exercises Knee/Hip   Knee/Hip Exercises: Aerobic   Stepper SciFit, seated stepper, Level 2.2 x 8 minutes, 4 extremities, keeping RPM >70 with cues for full stride length                  PT Short Term Goals - 09/22/15 1005    PT SHORT TERM GOAL #1   Title Pt  will perform HEP for Parkinson's-specific deficits with wife's supervision/assistance.  TARGET 09/22/15   Baseline per wife's report-sit<>stand transfers for HEP   Time 4   Period Weeks   Status Achieved   PT SHORT TERM GOAL #2   Title Pt will perform at least 6 of 10 reps of sit<>stand transfers from 18-20 inch surfaces with UE support with no evidence of posterior lean, for improved safety and efficiency of transfers.   Time 4   Period Weeks   Status Achieved   PT SHORT TERM GOAL #3   Title Pt will improve TUG score to less than or equal to 20 seconds for decreased fall risk.   Time 4   Period Weeks   Status Not Met   PT SHORT TERM GOAL #4   Title Pt/wife will verbalize understanding of techniques to decrease festinating/freezing of gait.   Baseline Wife verbalizes understanding and provides cues for pt.   Time 4   Period Weeks   Status Partially Met           PT Long Term Goals - 08/28/15 2154    PT LONG TERM GOAL #1   Title Pt/wife will verbalize understanding of fall prevention within the home.  TARGET 10/23/15   Time 8   Period Weeks   Status New   PT LONG TERM GOAL #2   Title Pt will improve Berg Balance score to at least 28/56 for decreased fall risk.   Time 8   Period Weeks   Status New   PT LONG TERM GOAL #3   Title Pt will improve gait velocity to at least 2.62 ft/sec for improved gait efficiency and safety.   Time 8   Period Weeks   Status New   PT LONG TERM GOAL #4   Title Pt will perform floor>stand transfers with minimal assistance and UE support for safe fall recovery.   Time 8   Period Weeks   Status New   PT LONG TERM GOAL #5   Title Pt will verbalize plans for continued community fitness upon D/C from PT.   Time 8   Period Weeks   Status New               Plan - 09/28/15 1243    Clinical Impression Statement Pt using pocket metronome given by speech for speech pacing, and pt seems to have overall slowed pacing with movement patterns  today as well.  Pt will continue to benefit from further skilled PT to address functional strengthening, safety with functional mobility training.   Pt will benefit from skilled therapeutic intervention in order to improve on the following deficits Abnormal gait;Decreased balance;Decreased mobility;Decreased strength;Decreased safety awareness;Difficulty walking;Postural dysfunction   Rehab Potential Good  PT Frequency 2x / week   PT Duration 8 weeks  plus eval   PT Treatment/Interventions ADLs/Self Care Home Management;Therapeutic exercise;Therapeutic activities;Functional mobility training;Gait training;Balance training;Neuromuscular re-education;Patient/family education   PT Next Visit Plan Continue functional strengthening, use of machines, standing balance at counter as appropriate (may consider adding forward step and weightshift to HEP)   Consulted and Agree with Plan of Care Patient;Family member/caregiver   Family Member Consulted daughter and grandchildren        Problem List Patient Active Problem List   Diagnosis Date Noted  . Lumbar stenosis 06/06/2015  . HOCM (hypertrophic obstructive cardiomyopathy) ([place]) 12/12/2014  . Infection of right prepatellar bursa 10/18/2014  . DOE (dyspnea on exertion) 09/06/2014  . Fatigue due to excessive exertion 09/06/2014  . Testosterone deficiency 11/12/2013  . Routine general medical examination at a health care facility 08/30/2013  . Mixed hyperlipidemia 08/30/2013  . Other abnormal glucose 08/30/2013  . 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 Parkinsonism ([place]) 01/16/2013  . INSOMNIA 06/06/2008  . GLAUCOMA 06/01/2008  . Essential hypertension 06/01/2008  . Seasonal and perennial allergic rhinitis 06/01/2008    Jerzy Roepke W. 09/28/2015, 12:47 PM  Frazier Butt., PT  [facility] [address] [place], [place], [address] Phone: [phone]   Fax:   [phone]  Name: [name] MRN: [id number] Date of Birth: 10/04/1946

## 2015-09-28 NOTE — Patient Instructions (Signed)
You must "gather" and "swallow hard" when you have liquids. Two SEPARATE motions.

## 2015-09-28 NOTE — Therapy (Signed)
[facility] [address] [place], [place], [address] Phone: [phone]   Fax:  [phone]  Speech Language Pathology Treatment  Patient Details  Name: [name] MRN: [id number] Date of Birth: [birth year] No Data Recorded  Encounter Date: 09/28/2015      End of Session - 09/28/15 1206    Visit Number 11   Number of Visits 17   Date for SLP Re-Evaluation 10/10/15   SLP Start Time 1104   SLP Stop Time  1145   SLP Time Calculation (min) 41 min   Activity Tolerance Patient tolerated treatment well      Past Medical History  Diagnosis Date  . HTN (hypertension)   . Glaucoma   . Allergic rhinitis   . Parkinsonism ([place]) 01/16/2013  . Hyperlipidemia   . Thyroid disease     HYPOTHYROID  . Hypogonadism male   . Pre-diabetes   . Complication of anesthesia     Slow to awaken  . Dysphagia   . Hypothyroidism   . Constipation   . GERD (gastroesophageal reflux disease)     Decdreased, has decreased coffee intack.  . Arthritis   . DDD (degenerative disc disease), cervical   . Cancer ([place])     basal cell skin cancer  . OSA (obstructive sleep apnea)     Has had surgery x 2    Past Surgical History  Procedure Laterality Date  . Uvulopalatopharyngoplasty    . Lumbar disc surgery    . Cataract extraction    . I&d extremity Right 10/18/2014    Procedure: IRRIGATION AND DEBRIDEMENT RIGHT KNEE PREPATELLA BURSA INFECTION;  Surgeon: [name], MD;  Location: [facility];  Service: Orthopedics;  Laterality: Right;  . Tonsillectomy      and adenoids  . Nasal septum surgery    . Lumbar laminectomy with coflex 1 level N/A 06/06/2015    Procedure: Lumbar three-four Laminectomy/Foraminotomy with placement of Coflex;  Surgeon: [name], MD;  Location: [facility];  Service: Neurosurgery;  Laterality: N/A;    There were no vitals filed for this visit.  Visit Diagnosis: Dysarthria  Oropharyngeal dysphagia       Subjective Assessment - 09/27/15 1323    Patient is accompained by: --  grandchildren and daughter               ADULT SLP TREATMENT - 09/28/15 1116    General Information   Behavior/Cognition Alert;Pleasant mood;Cooperative   Treatment Provided   Treatment provided Cognitive-Linquistic   Dysphagia Treatment   Temperature Spikes Noted No   Treatment Methods Therapeutic exercise;Compensation strategy training;Patient/caregiver education;Skilled observation   Patient observed directly with PO's Yes   Type of PO's observed Thin liquids   Pharyngeal Phase Signs & Symptoms Immediate cough;Immediate throat clear   Other treatment/comments Pt performed HEP with SLP assessing performance - req'd rare min A. Pt coughed x3 taking sips of water (approx 20% of the time), each with pt stating he didi not take time between "gather liquid" and "swallow hard", that SLP had been reiterating during the session.   Pain Assessment   Pain Assessment No/denies pain   Cognitive-Linquistic Treatment   Treatment focused on Dysarthria   Skilled Treatment In structured tasks, pt was 90% intelligible using a metronome. Pt's self correction occurred 70% of the time using metronome at 72 beats per minute. In spontaneous speech, little carryover was seen from structured tasks.    Assessment / Recommendations / Plan   Plan Continue  with current plan of care   Progression Toward Goals   Progression toward goals Progressing toward goals          SLP Education - 09/28/15 1204    Education provided Yes   Education Details swallow precautions   Person(s) Educated Patient;Child(ren);Other (comment)  grandson   Methods Explanation   Comprehension Verbalized understanding          SLP Short Term Goals - Oct 20, 2015 1650    SLP SHORT TERM GOAL #1   Title pt will perform HEP with occasional min A   Period Weeks   Status Achieved   SLP SHORT TERM GOAL #2   Title pt will demo swallow precautions with POs  to remain safe during meals with occasional min A   Time 1   Period Weeks   Status Not Met          SLP Long Term Goals - 09/28/15 1206    SLP LONG TERM GOAL #1   Title pt will perform HEP to improve oropharyneal swallowing ability with rare min A   Time 3   Period Weeks   Status On-going   SLP LONG TERM GOAL #2   Title pt will follow swallow precautions with rare mod A   Time 3   Period Weeks   Status On-going   SLP LONG TERM GOAL #3   Title wife/caregiver will assist patient in completing HEP with modified independence (use of HEP form)   Time 3   Period Weeks   Status On-going   SLP LONG TERM GOAL #4   Title pt will demo 75% intelligiblity with spontaneous sentence responses   Status Achieved   SLP LONG TERM GOAL #5   Title pt will demo 85% intelligibility in simple conversation with occasional min A   Time 3   Period Weeks   Status New          Plan - 09/28/15 1111    Clinical Impression Statement Pt utlized metronome for reduced rate and 95% intellgibilty on structured tasks and in simple conversation. Without metronome pt was still functional but focused listening needed.  Continue skilled ST to maximize intelligibilty and  safety of swallow.   Speech Therapy Frequency 2x / week   Duration --  3 weeks   Treatment/Interventions Aspiration precaution training;Pharyngeal strengthening exercises;Diet toleration management by SLP;Compensatory techniques;Environmental controls   Potential to Achieve Goals Fair   Potential Considerations Previous level of function;Severity of impairments   Consulted and Agree with Plan of Care Patient          G-Codes - 10/20/2015 1652    Functional Assessment Tool Used MBSS report   Functional Limitations Swallowing   Swallow Current Status (B5102) At least 20 percent but less than 40 percent impaired, limited or restricted   Swallow Goal Status (H8527) At least 20 percent but less than 40 percent impaired, limited or restricted       Problem List Patient Active Problem List   Diagnosis Date Noted  . Lumbar stenosis 06/06/2015  . HOCM (hypertrophic obstructive cardiomyopathy) ([place]) 12/12/2014  . Infection of right prepatellar bursa 10/18/2014  . DOE (dyspnea on exertion) 09/06/2014  . Fatigue due to excessive exertion 09/06/2014  . Testosterone deficiency 11/12/2013  . Routine general medical examination at a health care facility 08/30/2013  . Mixed hyperlipidemia 08/30/2013  . Other abnormal glucose 08/30/2013  . 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 Parkinsonism ([place]) 01/16/2013  . INSOMNIA 06/06/2008  . GLAUCOMA 06/01/2008  .  Essential hypertension 06/01/2008  . Seasonal and perennial allergic rhinitis 06/01/2008    [facility] , MS, CCC-SLP 09/28/2015, 12:16 PM  [facility] [address] [place], [place], [address] Phone: [phone]   Fax:  [phone]   Name: Aryav Wimberly MRN: [id number] Date of Birth: Apr 07, 1946

## 2015-09-29 ENCOUNTER — Ambulatory Visit: Payer: Medicare Other | Admitting: Physical Therapy

## 2015-09-29 NOTE — Therapy (Signed)
[facility] [address] [place], [place], [address] Phone: [phone]   Fax:  [phone]  Occupational Therapy Treatment  Patient Details  Name: [name] MRN: [id number] Date of Birth: 27-Apr-1946 Referring Provider: Dr. Guadelupe Sabin  Encounter Date: 09/28/2015      OT End of Session - 09/28/15 1259    Visit Number 8   Number of Visits 17   Date for OT Re-Evaluation 10/23/15   Authorization Time Period Pt is scheduled through 10/19/15   Authorization - Visit Number 8  g-code next week   Authorization - Number of Visits 10   OT Start Time 3546   OT Stop Time 1315   OT Time Calculation (min) 40 min   Activity Tolerance Patient tolerated treatment well   Behavior During Therapy [facility] for tasks assessed/performed      Past Medical History  Diagnosis Date  . HTN (hypertension)   . Glaucoma   . Allergic rhinitis   . Parkinsonism ([place]) 01/16/2013  . Hyperlipidemia   . Thyroid disease     HYPOTHYROID  . Hypogonadism male   . Pre-diabetes   . Complication of anesthesia     Slow to awaken  . Dysphagia   . Hypothyroidism   . Constipation   . GERD (gastroesophageal reflux disease)     Decdreased, has decreased coffee intack.  . Arthritis   . DDD (degenerative disc disease), cervical   . Cancer ([place])     basal cell skin cancer  . OSA (obstructive sleep apnea)     Has had surgery x 2    Past Surgical History  Procedure Laterality Date  . Uvulopalatopharyngoplasty    . Lumbar disc surgery    . Cataract extraction    . I&d extremity Right 10/18/2014    Procedure: IRRIGATION AND DEBRIDEMENT RIGHT KNEE PREPATELLA BURSA INFECTION;  Surgeon: [name], MD;  Location: [facility];  Service: Orthopedics;  Laterality: Right;  . Tonsillectomy      and adenoids  . Nasal septum surgery    . Lumbar laminectomy with coflex 1 level N/A 06/06/2015    Procedure: Lumbar three-four Laminectomy/Foraminotomy with placement of  Coflex;  Surgeon: [name], MD;  Location: [facility];  Service: Neurosurgery;  Laterality: N/A;    There were no vitals filed for this visit.  Visit Diagnosis:  Hypokinesia  Decreased coordination  Decreased functional mobility  Rigidity  Cognitive deficits      Subjective Assessment - 09/28/15 1242    Patient is accompained by: Family member   Pertinent History see Epic   Patient Stated Goals Per pt/wife:  improve writing, brushing hair, picking up cups, brushing teeth, washing hair, pulling down shirt, cutting food.   Currently in Pain? No/denies         Treatment:Pt's daughter attended therapy today along with grandson. Pt practiced brushing hair and simulated washing hair with bigger movements, min-mod v.c. cues and hand over hand assist initially.  Pt practiced doffing/ donning jacket in seated min v.c./ assist. Therapist finished checking short term goals. Functional grasp release of a cup with bilateral UE's  using big movements with good success with min v.c Simulated cutting food task with use of bigger moements, min v.c.with good follow through                       OT Short Term Goals - 09/28/15 1243    OT SHORT TERM GOAL #1   Title  Pt will perform updated HEP with supervision/min cues.--check STGs 09/22/15   Status On-going   OT SHORT TERM GOAL #2   Title Pt will write name with at least 75% legibility.   Status Achieved   OT SHORT TERM GOAL #3   Title Pt will report incr ease with washing hair.   Baseline inconsistent performance 09/28/15   Status On-going   OT SHORT TERM GOAL #4   Title Pt will pick up cup using big amplitude movements in 5/10 attempts.   Status Achieved   OT SHORT TERM GOAL #5   Title Pt will report incr ease with brushing teeth.   Baseline met with electric toothbrush 09/28/15   Status Achieved           OT Long Term Goals - 08/25/15 2141    OT LONG TERM GOAL #1   Title Pt will verbalize  understanding of adaptive strategies/AE for ADLs prn.--check LTGs 10/23/15   Time 8   Period Weeks   Status New   OT LONG TERM GOAL #2   Title Pt will be able to don shirt using big amplitude movement strategies.   Time 8   Period Weeks   Status New   OT LONG TERM GOAL #3   Title Pt will be able to write name and simple phrase with at least 90% legibility.   Time 8   Period Weeks   Status New   OT LONG TERM GOAL #4   Title Pt will pick up cup using big amplitude movements in 8/10 attempts.   Time 8   Period Weeks   Status New   OT LONG TERM GOAL #5   Title Pt will be able to brush hair with min A.   Time 8   Period Weeks   Status New   Long Term Additional Goals   Additional Long Term Goals Yes   OT LONG TERM GOAL #6   Title Pt will be able to cut food mod I using AE/stratgies prn.   Time 8   Period Weeks   Status New   OT LONG TERM GOAL #7   Title Pt will improve coordination for ADLs as shown by improving time on 9-hole peg test by at [place] with dominant LUE.   Baseline 46.31sec   Time 8   Period Weeks   Status New               Plan - 09/28/15 1252    Clinical Impression Statement Pt is progressing towards goals however he remains limited by impulsivity and freezing episodes.   Pt will benefit from skilled therapeutic intervention in order to improve on the following deficits (Retired) Decreased cognition;Decreased mobility;Impaired perceived functional ability;Impaired UE functional use;Decreased knowledge of use of DME;Decreased balance;Decreased knowledge of precautions;Impaired tone;Decreased safety awareness;Decreased coordination   Rehab Potential Good   OT Frequency 2x / week   OT Duration 8 weeks   OT Treatment/Interventions Self-care/ADL training;Cryotherapy;Electrical Stimulation;Moist Heat;Passive range of motion;Cognitive remediation/compensation;Fluidtherapy;DME and/or AE instruction;Therapeutic activities;Therapeutic exercises;Energy  conservation;Neuromuscular education;Ultrasound;Manual Therapy;Splinting;Patient/family education;Therapist, nutritional;Therapeutic exercise   Plan continue to address large amplitude movments with functional activity   OT Home Exercise Plan Education issued:  HEP for functional activities with focus on timing/big amplitude movements   Consulted and Agree with Plan of Care Patient;Family member/caregiver   Family Member Consulted daughter        Problem List Patient Active Problem List   Diagnosis Date Noted  . Lumbar stenosis 06/06/2015  .  HOCM (hypertrophic obstructive cardiomyopathy) ([place]) 12/12/2014  . Infection of right prepatellar bursa 10/18/2014  . DOE (dyspnea on exertion) 09/06/2014  . Fatigue due to excessive exertion 09/06/2014  . Testosterone deficiency 11/12/2013  . Routine general medical examination at a health care facility 08/30/2013  . Mixed hyperlipidemia 08/30/2013  . Other abnormal glucose 08/30/2013  . 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 Parkinsonism ([place]) 01/16/2013  . INSOMNIA 06/06/2008  . GLAUCOMA 06/01/2008  . Essential hypertension 06/01/2008  . Seasonal and perennial allergic rhinitis 06/01/2008    Nil Xiong 09/29/2015, 8:17 AM Theone Murdoch, OTR/L [phone] Phone: [phone] 8:17 AM 12/22/2016[facility] [address] [place], [place], [address] Phone: [phone]   Fax:  [phone]  Name: [name] MRN: [id number] Date of Birth: 04-18-1946

## 2015-09-30 ENCOUNTER — Encounter: Payer: Self-pay | Admitting: Internal Medicine

## 2015-09-30 ENCOUNTER — Ambulatory Visit (INDEPENDENT_AMBULATORY_CARE_PROVIDER_SITE_OTHER): Payer: Medicare Other | Admitting: Internal Medicine

## 2015-09-30 VITALS — BP 130/88 | HR 74 | Temp 97.6°F | Resp 18 | Ht 70.0 in | Wt 204.0 lb

## 2015-09-30 DIAGNOSIS — E291 Testicular hypofunction: Secondary | ICD-10-CM

## 2015-09-30 DIAGNOSIS — G20 Parkinson's disease: Secondary | ICD-10-CM

## 2015-09-30 DIAGNOSIS — I10 Essential (primary) hypertension: Secondary | ICD-10-CM

## 2015-09-30 DIAGNOSIS — R7302 Impaired glucose tolerance (oral): Secondary | ICD-10-CM

## 2015-09-30 DIAGNOSIS — E785 Hyperlipidemia, unspecified: Secondary | ICD-10-CM | POA: Diagnosis not present

## 2015-09-30 DIAGNOSIS — Z0000 Encounter for general adult medical examination without abnormal findings: Secondary | ICD-10-CM | POA: Diagnosis not present

## 2015-09-30 DIAGNOSIS — E349 Endocrine disorder, unspecified: Secondary | ICD-10-CM

## 2015-09-30 LAB — CBC WITH DIFFERENTIAL/PLATELET
BASOS PCT: 0.3 % (ref 0.0–3.0)
Basophils Absolute: 0 10*3/uL (ref 0.0–0.1)
EOS ABS: 0.3 10*3/uL (ref 0.0–0.7)
Eosinophils Relative: 5.5 % — ABNORMAL HIGH (ref 0.0–5.0)
HCT: 52.9 % — ABNORMAL HIGH (ref 39.0–52.0)
HEMOGLOBIN: 17.9 g/dL — AB (ref 13.0–17.0)
LYMPHS ABS: 1.8 10*3/uL (ref 0.7–4.0)
Lymphocytes Relative: 29.6 % (ref 12.0–46.0)
MCHC: 33.8 g/dL (ref 30.0–36.0)
MCV: 93.9 fl (ref 78.0–100.0)
Monocytes Absolute: 0.6 10*3/uL (ref 0.1–1.0)
Monocytes Relative: 9.4 % (ref 3.0–12.0)
NEUTROS ABS: 3.3 10*3/uL (ref 1.4–7.7)
Neutrophils Relative %: 55.2 % (ref 43.0–77.0)
Platelets: 224 10*3/uL (ref 150.0–400.0)
RBC: 5.64 Mil/uL (ref 4.22–5.81)
RDW: 12.7 % (ref 11.5–15.5)
WBC: 6.1 10*3/uL (ref 4.0–10.5)

## 2015-09-30 LAB — COMPREHENSIVE METABOLIC PANEL
ALBUMIN: 4.4 g/dL (ref 3.5–5.2)
ALT: 31 U/L (ref 0–53)
AST: 29 U/L (ref 0–37)
Alkaline Phosphatase: 69 U/L (ref 39–117)
BUN: 12 mg/dL (ref 6–23)
CHLORIDE: 101 meq/L (ref 96–112)
CO2: 31 mEq/L (ref 19–32)
CREATININE: 0.84 mg/dL (ref 0.40–1.50)
Calcium: 9.5 mg/dL (ref 8.4–10.5)
GFR: 96.41 mL/min (ref >60.00)
Glucose, Bld: 95 mg/dL (ref 70–99)
Potassium: 4.8 mEq/L (ref 3.5–5.1)
SODIUM: 138 meq/L (ref 135–145)
Total Bilirubin: 0.7 mg/dL (ref 0.2–1.2)
Total Protein: 7 g/dL (ref 6.0–8.3)

## 2015-09-30 LAB — LIPID PANEL
CHOLESTEROL: 185 mg/dL (ref 0–200)
HDL: 38.9 mg/dL — ABNORMAL LOW (ref >39.00)
LDL CALC: 108 mg/dL — AB (ref 0–99)
NonHDL: 146.11
Total CHOL/HDL Ratio: 5
Triglycerides: 191 mg/dL — ABNORMAL HIGH (ref 0.0–149.0)
VLDL: 38.2 mg/dL (ref 0.0–40.0)

## 2015-09-30 LAB — TSH: TSH: 2.35 u[IU]/mL (ref 0.35–4.50)

## 2015-09-30 LAB — HEMOGLOBIN A1C: HEMOGLOBIN A1C: 5.2 % (ref 4.6–6.5)

## 2015-09-30 MED ORDER — TESTOSTERONE CYPIONATE 200 MG/ML IM SOLN
400.0000 mg | Freq: Once | INTRAMUSCULAR | Status: AC
  Administered 2015-09-30: 400 mg via INTRAMUSCULAR

## 2015-09-30 NOTE — Progress Notes (Signed)
Subjective:    Patient ID: [name], male    DOB: [birth year], 69 y.o.   MRN: [id number]  HPI     Subjective:    Patient ID: [name], male    DOB: Mar 06, 1946, 69 y.o.   MRN: [id number]  HPI  76  -year-old patient who is seen today for a annual examination Chronic medical problems include hypertension glaucoma allergic rhinitis and history of impaired glucose tolerance. He is on testosterone replacement for testosterone deficiency and has a history of vitamin D. deficiency. He is followed by neurology due to PD but has not been very tolerant of medications. He is followed by multiple consultants including cardiology.  He has had a recent nuclear stress test and 2-D echocardiogram.  He is followed by ophthalmology;  Last colonoscopy 2005   Surgical history. In August, he underwent decompressive laminectomy for lumbar spinal stenosis.  Family history father died at age 79 with liver cancer also had hypothyroidism Mother died 19 complications of COPD One brother history of schizophrenia 2 sisters' COPD lupus and hypothyroidism  Social history. Retired Film/video editor. Married for 46 years. 2 daughters and 3 grandchildren. No tobacco use  Surgical history prior tonsillectomy and uvuloplasty. 1992 lumbar disc disease surgery  Past Medical History  Diagnosis Date  . HTN (hypertension)   . Glaucoma   . Allergic rhinitis   . Parkinsonism ([place]) 01/16/2013  . Hyperlipidemia   . Thyroid disease     HYPOTHYROID  . Hypogonadism male   . Pre-diabetes   . Complication of anesthesia     Slow to awaken  . Dysphagia   . Hypothyroidism   . Constipation   . GERD (gastroesophageal reflux disease)     Decdreased, has decreased coffee intack.  . Arthritis   . DDD (degenerative disc disease), cervical   . Cancer ([place])     basal cell skin cancer  . OSA (obstructive sleep apnea)     Has had surgery x 2    Social History   Social History  . Marital Status: Married   Spouse Name: linda  . Number of Children: 2  . Years of Education: college   Occupational History  . branch Librarian, academic   .     Social History Main Topics  . Smoking status: Never Smoker   . Smokeless tobacco: Never Used  . Alcohol Use: No  . Drug Use: No  . Sexual Activity: Not on file   Other Topics Concern  . Not on file   Social History Narrative    Past Surgical History  Procedure Laterality Date  . Uvulopalatopharyngoplasty    . Lumbar disc surgery    . Cataract extraction    . I&d extremity Right 10/18/2014    Procedure: IRRIGATION AND DEBRIDEMENT RIGHT KNEE PREPATELLA BURSA INFECTION;  Surgeon: [name], MD;  Location: [facility];  Service: Orthopedics;  Laterality: Right;  . Tonsillectomy      and adenoids  . Nasal septum surgery    . Lumbar laminectomy with coflex 1 level N/A 06/06/2015    Procedure: Lumbar three-four Laminectomy/Foraminotomy with placement of Coflex;  Surgeon: [name], MD;  Location: [facility];  Service: Neurosurgery;  Laterality: N/A;    Family History  Problem Relation Age of Onset  . Emphysema Mother   . COPD Mother   . Heart disease Mother   . Liver cancer Father   . Cancer Father   . Diabetes Father   . Heart disease Sister   .  COPD Sister   . Heart attack Sister   . Hypertension Mother   . Hypertension Father   . Hypertension Sister   . Stroke Neg Hx     Allergies  Allergen Reactions  . Ambien [Zolpidem Tartrate]     Unknown- balance  . Atenolol     unknown  . Lunesta [Eszopiclone]     unknown  . Oxazepam     unknown  . Requip [Ropinirole Hcl]     unknown  . Ziac [Bisoprolol-Hydrochlorothiazide]     unknown    Current Outpatient Prescriptions on File Prior to Visit  Medication Sig Dispense Refill  . aspirin 81 MG tablet Take 81 mg by mouth daily.    Marland Kitchen BYSTOLIC 2.5 MG tablet TAKE 1 TABLET BY MOUTH EVERY DAY 30 tablet 9  . Cholecalciferol (VITAMIN D3) 10000 UNITS capsule Take 5,000 Units by mouth  daily. Mon. ,[id number] weekly    . fluticasone (FLONASE) 50 MCG/ACT nasal spray USE 1-2 SPRAYS IN EACH NOSTRIL EVERY DAY 16 g 6  . levothyroxine (SYNTHROID, LEVOTHROID) 50 MCG tablet TAKE 1 TABLET BY MOUTH EVERY DAY 30 tablet 5  . loratadine (CLARITIN) 10 MG tablet Take 10 mg by mouth daily.    Marland Kitchen losartan (COZAAR) 100 MG tablet TAKE 1 TABLET BY MOUTH DAILY 30 tablet 5  . magnesium gluconate (MAGONATE) 500 MG tablet Take 500 mg by mouth as needed.    . Multiple Vitamins-Minerals (MULTIVITAMIN WITH MINERALS) tablet Take 1 tablet by mouth daily.      . Naproxen Sodium (ALEVE PO) Take 2 tablets by mouth daily as needed. For pain    . Omega-3 Fatty Acids (FISH OIL) 1000 MG CAPS Take 1,200 mg by mouth daily. Pt takes 2400 mg on Tues, Thurs and Sat.    . polyethylene glycol (MIRALAX / GLYCOLAX) packet Take 17 g by mouth daily as needed.    . testosterone cypionate (DEPO-TESTOSTERONE) 200 MG/ML injection Inject 2 mLs (400 mg total) into the muscle every 21 ( twenty-one) days. 10 mL 5  . timolol (TIMOPTIC) 0.5 % ophthalmic solution Place 1 drop into both eyes 2 (two) times daily.   12   No current facility-administered medications on file prior to visit.    BP 130/88 mmHg  Pulse 74  Temp(Src) 97.6 F (36.4 C) (Oral)  Resp 18  Ht 5\' 10"  (1.778 m)  Wt 204 lb (92.534 kg)  BMI 29.27 kg/m2  SpO2 98%  1. Risk factors, based on past  M,S,F history.  Cardiovascular risk factors include hypertension and dyslipidemia.  Recent normal nuclear stress test  2.  Physical activities: Limited due to parkinsonism and fatigue  3.  Depression/mood: No history of major depression  4.  Hearing: No significant deficits  5.  ADL's: Independent in all aspects of daily living 6.  Fall risk:  7.  Home safety: Moderately high due to parkinsonism and gait instability  8.  Height weight, and visual acuity; height and weight stable no change in visual acuity   9.  Counseling: Heart healthy diet recommended  10.  Lab orders based on risk factors: Will check lipid profile and electrolytes  11. Referral : Follow-up cardiology, urology, ophthalmology  12. Care plan: Continue heart healthy diet and present medical regimen.  Patient is up-to-date on colonoscopies  13. Cognitive assessment: Alert and oriented with normal affect.  No cognitive dysfunction  14. Screening:  Patient was provided with a written and personalized care plan  15. Provider List Update: , urology,  ophthalmology, neurology, cardiology  And neurosurgery     Review of Systems  Constitutional: Negative for fever, chills, activity change, appetite change and fatigue.  HENT: Negative for congestion, dental problem, ear pain, hearing loss, mouth sores, rhinorrhea, sinus pressure, sneezing, tinnitus, trouble swallowing and voice change.   Eyes: Negative for photophobia, pain, redness and visual disturbance.  Respiratory: Negative for apnea, cough, choking, chest tightness, shortness of breath and wheezing.   Cardiovascular: Negative for chest pain, palpitations and leg swelling.  Gastrointestinal: Negative for nausea, vomiting, abdominal pain, diarrhea, constipation, blood in stool, abdominal distention, anal bleeding and rectal pain.  Genitourinary: Positive for urgency. Negative for dysuria, frequency, hematuria, flank pain, decreased urine volume, discharge, penile swelling, scrotal swelling, difficulty urinating, genital sores and testicular pain.  Musculoskeletal: Positive for gait problem. Negative for arthralgias, back pain, joint swelling, myalgias, neck pain and neck stiffness.  Skin: Negative for color change, rash and wound.  Neurological: Negative for dizziness, tremors, seizures, syncope, facial asymmetry, speech difficulty, weakness, light-headedness, numbness and headaches.  Hematological: Negative for adenopathy. Does not bruise/bleed easily.  Psychiatric/Behavioral: Negative for suicidal ideas, hallucinations, behavioral  problems, confusion, sleep disturbance, self-injury, dysphoric mood, decreased concentration and agitation. The patient is not nervous/anxious.        Objective:   Physical Exam  Constitutional: He appears well-developed and well-nourished.  HENT:  Head: Normocephalic and atraumatic.  Right Ear: External ear normal.  Left Ear: External ear normal.  Nose: Nose normal.  Mouth/Throat: Oropharynx is clear and moist.  Pharyngeal crowding Status post palatoplasty  Eyes: Conjunctivae and EOM are normal. Pupils are equal, round, and reactive to light. No scleral icterus.  Neck: Normal range of motion. Neck supple. No JVD present. No thyromegaly present.  Cardiovascular: Regular rhythm, normal heart sounds and intact distal pulses.  Exam reveals no gallop and no friction rub.   No murmur heard. Dorsalis pedis pulses full. Posterior tibial pulses not easily palpable  Pulmonary/Chest: Effort normal and breath sounds normal. He exhibits no tenderness.  Abdominal: Soft. Bowel sounds are normal. He exhibits no distension and no mass. There is no tenderness.  Genitourinary: Penis normal.  Musculoskeletal: Normal range of motion. He exhibits no edema and no tenderness.  Lymphadenopathy:    He has no cervical adenopathy.  Neurological: He is alert. He has normal reflexes. No cranial nerve deficit. Coordination normal.  Skin: Skin is warm and dry. No rash noted.  Psychiatric: He has a normal mood and affect. His behavior is normal.          Assessment & Plan:   Hypertension. Will continue home blood pressure monitoring and present regimen. Modest weight loss salt restricted diet encouraged Seasonal allergic rhinitis/status post recent URI BPH.  F/u Urology Parkinson's disease. Followup neurology  Review of Systems  Constitutional: Positive for fatigue.  Respiratory: Positive for shortness of breath.   Musculoskeletal: Positive for gait problem.       Objective:   Physical Exam   As  above      Assessment & Plan:    preventive health.  Will schedule follow-up colonoscopy  Parkinson's disease.  Follow-up neurology  Essential hypertension, stable 3.  Low-salt diet recommended.  Recheck 6 months or as needed   We'll check updated lab

## 2015-09-30 NOTE — Progress Notes (Signed)
Pre visit review using our clinic review tool, if applicable. No additional management support is needed unless otherwise documented below in the visit note. 

## 2015-09-30 NOTE — Patient Instructions (Signed)
Limit your sodium (Salt) intake  Please check your blood pressure on a regular basis.  If it is consistently greater than 150/90, please make an office appointment.  Return in 6 months for follow-up  Schedule your colonoscopy to help detect colon cancer.

## 2015-10-01 LAB — HEPATITIS C ANTIBODY: HCV AB: NEGATIVE

## 2015-10-05 ENCOUNTER — Encounter: Payer: BLUE CROSS/BLUE SHIELD | Admitting: Internal Medicine

## 2015-10-05 ENCOUNTER — Ambulatory Visit: Payer: Medicare Other

## 2015-10-05 ENCOUNTER — Ambulatory Visit: Payer: Medicare Other | Admitting: Physical Therapy

## 2015-10-12 ENCOUNTER — Ambulatory Visit: Payer: Medicare Other | Admitting: Occupational Therapy

## 2015-10-12 ENCOUNTER — Ambulatory Visit: Payer: Medicare Other | Attending: Neurology | Admitting: Physical Therapy

## 2015-10-12 DIAGNOSIS — R278 Other lack of coordination: Secondary | ICD-10-CM

## 2015-10-12 DIAGNOSIS — R2681 Unsteadiness on feet: Secondary | ICD-10-CM

## 2015-10-12 DIAGNOSIS — R279 Unspecified lack of coordination: Secondary | ICD-10-CM

## 2015-10-12 DIAGNOSIS — R269 Unspecified abnormalities of gait and mobility: Secondary | ICD-10-CM | POA: Diagnosis not present

## 2015-10-12 DIAGNOSIS — R471 Dysarthria and anarthria: Secondary | ICD-10-CM | POA: Diagnosis not present

## 2015-10-12 DIAGNOSIS — R4189 Other symptoms and signs involving cognitive functions and awareness: Secondary | ICD-10-CM | POA: Insufficient documentation

## 2015-10-12 DIAGNOSIS — R1312 Dysphagia, oropharyngeal phase: Secondary | ICD-10-CM | POA: Diagnosis not present

## 2015-10-12 DIAGNOSIS — R6889 Other general symptoms and signs: Secondary | ICD-10-CM | POA: Diagnosis not present

## 2015-10-12 DIAGNOSIS — R258 Other abnormal involuntary movements: Secondary | ICD-10-CM | POA: Diagnosis not present

## 2015-10-12 DIAGNOSIS — R2689 Other abnormalities of gait and mobility: Secondary | ICD-10-CM | POA: Diagnosis not present

## 2015-10-12 DIAGNOSIS — R29898 Other symptoms and signs involving the musculoskeletal system: Secondary | ICD-10-CM | POA: Diagnosis not present

## 2015-10-12 NOTE — Therapy (Signed)
[facility] [address] [place], [place], [address] Phone: [phone]   Fax:  [phone]  Occupational Therapy Treatment  Patient Details  Name: [name] MRN: [id number] Date of Birth: 1946/01/15 Referring Provider: Dr. Guadelupe Sabin  Encounter Date: 10/12/2015      OT End of Session - 10/12/15 0920    Visit Number 9   Number of Visits 17   Date for OT Re-Evaluation 10/23/15   Authorization Time Period Pt is scheduled through 10/19/15   Authorization - Visit Number 9  g-code next session   Authorization - Number of Visits 10   OT Start Time [phone]   OT Stop Time 0930   OT Time Calculation (min) 39 min   Activity Tolerance Patient tolerated treatment well   Behavior During Therapy [facility] for tasks assessed/performed      Past Medical History  Diagnosis Date  . HTN (hypertension)   . Glaucoma   . Allergic rhinitis   . Parkinsonism ([place]) 01/16/2013  . Hyperlipidemia   . Thyroid disease     HYPOTHYROID  . Hypogonadism male   . Pre-diabetes   . Complication of anesthesia     Slow to awaken  . Dysphagia   . Hypothyroidism   . Constipation   . GERD (gastroesophageal reflux disease)     Decdreased, has decreased coffee intack.  . Arthritis   . DDD (degenerative disc disease), cervical   . Cancer ([place])     basal cell skin cancer  . OSA (obstructive sleep apnea)     Has had surgery x 2    Past Surgical History  Procedure Laterality Date  . Uvulopalatopharyngoplasty    . Lumbar disc surgery    . Cataract extraction    . I&d extremity Right 10/18/2014    Procedure: IRRIGATION AND DEBRIDEMENT RIGHT KNEE PREPATELLA BURSA INFECTION;  Surgeon: [name], MD;  Location: [facility];  Service: Orthopedics;  Laterality: Right;  . Tonsillectomy      and adenoids  . Nasal septum surgery    . Lumbar laminectomy with coflex 1 level N/A 06/06/2015    Procedure: Lumbar three-four Laminectomy/Foraminotomy with placement of  Coflex;  Surgeon: [name], MD;  Location: [facility];  Service: Neurosurgery;  Laterality: N/A;    There were no vitals filed for this visit.  Visit Diagnosis:  Hypokinesia  Decreased coordination  Decreased functional mobility  Rigidity  Cognitive deficits  Unsteadiness      Subjective Assessment - 10/12/15 1037    Subjective  Pt reports no falls and that things at home are about the same.   Pertinent History see Epic   Patient Stated Goals Per pt/wife:  improve writing, brushing hair, picking up cups, brushing teeth, washing hair, pulling down shirt, cutting food.   Currently in Pain? No/denies                      OT Treatments/Exercises (OP) - 10/12/15 1040    ADLs   UB Dressing Pt put on jacket in standing with supervision for standing but no difficulty/LOB.     Bathing Practiced simulated washing hair x10 with min cueing for slow, large amplitude movements.  Practiced washing hands with min cueing for slow, big amplitude movements.   Functional Mobility In standing, functional reaching laterally and across body to facilitate wt. shifts and trunk rotation (set up for large amplitude movements) with min v.c. and supervision (but no LOB).  Pt ambulated within  clinic today with no freezing episodes or LOB (supervision provided).     Writing Practiced writing.  Pt wrote signature, address, and "appointment note" on lined paper with good legibility and  no decr in size.  Then wrote simple self-generated sentences x2 with min-mod decr in size and approx 90% legibility (decr in size noted at end of sentence).                  OT Short Term Goals - 09/28/15 1243    OT SHORT TERM GOAL #1   Title Pt will perform updated HEP with supervision/min cues.--check STGs 09/22/15   Status On-going   OT SHORT TERM GOAL #2   Title Pt will write name with at least 75% legibility.   Status Achieved   OT SHORT TERM GOAL #3   Title Pt will report incr ease with  washing hair.   Baseline inconsistent performance 09/28/15   Status On-going   OT SHORT TERM GOAL #4   Title Pt will pick up cup using big amplitude movements in 5/10 attempts.   Status Achieved   OT SHORT TERM GOAL #5   Title Pt will report incr ease with brushing teeth.   Baseline met with electric toothbrush 09/28/15   Status Achieved           OT Long Term Goals - 10/12/15 0903    OT LONG TERM GOAL #1   Title Pt/caregiver will verbalize understanding of adaptive strategies/AE for ADLs prn.--check LTGs 10/23/15   Time 8   Period Weeks   Status Revised   OT LONG TERM GOAL #2   Title Pt will be able to don shirt using big amplitude movement strategies.   Time 8   Period Weeks   Status New   OT LONG TERM GOAL #3   Title Pt will be able to write name and simple phrase with at least 90% legibility.   Time 8   Period Weeks   Status Achieved  10/11/14  Met at approx this level   OT LONG TERM GOAL #4   Title Pt will pick up cup using big amplitude movements in 8/10 attempts.   Time 8   Period Weeks   Status Achieved  10/12/15   OT LONG TERM GOAL #5   Title Pt will be able to brush hair with min A.   Time 8   Period Weeks   Status On-going   OT LONG TERM GOAL #6   Title Pt will be able to cut food mod I using AE/stratgies prn.   Time 8   Period Weeks   Status New   OT LONG TERM GOAL #7   Title Pt will improve coordination for ADLs as shown by improving time on 9-hole peg test by at [place] with dominant LUE.   Baseline 46.31sec   Time 8   Period Weeks   Status New               Plan - 10/12/15 0919    Clinical Impression Statement Pt is progressing towards goals with less cueing needed and incr consistency with large amplitude movement strategies.   Plan **G code next visit, continue to address large amplitude movements with functional activity   OT Home Exercise Plan Education issued:  HEP for functional activities with focus on timing/big amplitude  movements   Consulted and Agree with Plan of Care Patient        Problem List Patient Active Problem List  Diagnosis Date Noted  . Lumbar stenosis 06/06/2015  . HOCM (hypertrophic obstructive cardiomyopathy) ([place]) 12/12/2014  . Infection of right prepatellar bursa 10/18/2014  . DOE (dyspnea on exertion) 09/06/2014  . Fatigue due to excessive exertion 09/06/2014  . Testosterone deficiency 11/12/2013  . Routine general medical examination at a health care facility 08/30/2013  . Mixed hyperlipidemia 08/30/2013  . Other abnormal glucose 08/30/2013  . 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 Parkinsonism ([place]) 01/16/2013  . INSOMNIA 06/06/2008  . GLAUCOMA 06/01/2008  . Essential hypertension 06/01/2008  . Seasonal and perennial allergic rhinitis 06/01/2008    [facility] 10/12/2015, 10:46 AM  [facility] [address] [place], [place], [address] Phone: [phone]   Fax:  [phone]  Name: [name] MRN: [id number] Date of Birth: 11-16-1945  Vianne Bulls, OTR/L 10/12/2015 10:47 AM

## 2015-10-12 NOTE — Therapy (Signed)
[facility] [address] [place], [place], [address] Phone: [phone]   Fax:  [phone]  Physical Therapy Treatment  Patient Details  Name: [name] MRN: [id number] Date of Birth: 05-27-1946 Referring Provider: Rexene Alberts  Encounter Date: 10/12/2015      PT End of Session - 10/12/15 1233    Visit Number 9   Number of Visits 17   Date for PT Re-Evaluation 10/23/15   Authorization Type Medicare-G-code every 10th visit   PT Start Time 0802   PT Stop Time 0844   PT Time Calculation (min) 42 min   Equipment Utilized During Treatment Gait belt   Activity Tolerance Patient tolerated treatment well   Behavior During Therapy [facility] for tasks assessed/performed      Past Medical History  Diagnosis Date  . HTN (hypertension)   . Glaucoma   . Allergic rhinitis   . Parkinsonism ([place]) 01/16/2013  . Hyperlipidemia   . Thyroid disease     HYPOTHYROID  . Hypogonadism male   . Pre-diabetes   . Complication of anesthesia     Slow to awaken  . Dysphagia   . Hypothyroidism   . Constipation   . GERD (gastroesophageal reflux disease)     Decdreased, has decreased coffee intack.  . Arthritis   . DDD (degenerative disc disease), cervical   . Cancer ([place])     basal cell skin cancer  . OSA (obstructive sleep apnea)     Has had surgery x 2    Past Surgical History  Procedure Laterality Date  . Uvulopalatopharyngoplasty    . Lumbar disc surgery    . Cataract extraction    . I&d extremity Right 10/18/2014    Procedure: IRRIGATION AND DEBRIDEMENT RIGHT KNEE PREPATELLA BURSA INFECTION;  Surgeon: [name], MD;  Location: [facility];  Service: Orthopedics;  Laterality: Right;  . Tonsillectomy      and adenoids  . Nasal septum surgery    . Lumbar laminectomy with coflex 1 level N/A 06/06/2015    Procedure: Lumbar three-four Laminectomy/Foraminotomy with placement of Coflex;  Surgeon: [name], MD;  Location: [facility];   Service: Neurosurgery;  Laterality: N/A;    There were no vitals filed for this visit.  Visit Diagnosis:  Bradykinesia  Abnormality of gait  Unsteadiness      Subjective Assessment - 10/12/15 0804    Subjective No falls, no pain.  Forgot the pocket metronome today.  Daughter brought me today-wife had surgery last Friday.   Currently in Pain? No/denies                         [facility] Adult PT Treatment/Exercise - 10/12/15 0814    Transfers   Transfers Sit to Stand;Stand to Sit   Sit to Stand 5: Supervision;4: Min guard;With upper extremity assist;Without upper extremity assist;From elevated surface;From bed;From chair/3-in-1   Sit to Stand Details (indicate cue type and reason) Cues for improved initial forward lean, also cues given for increased rocking for momentum from low cushioned surface, additional 5 reps   Stand to Sit 5: Supervision;4: Min guard;With upper extremity assist;Without upper extremity assist;To elevated surface;To bed;To chair/3-in-1   Number of Reps 10 reps;Other sets (comment)  from 24", 22", 18", then 16" with cushion   Comments Squats at counter for functional strengthening 10 reps with bilateral UE support   Ambulation/Gait   Ambulation/Gait Yes   Ambulation/Gait Assistance 4: Min guard   Ambulation  Distance (Feet) 460 Feet  in 3 minutes   Assistive device Rolling walker   Gait Pattern Step-through pattern;Decreased step length - right;Decreased step length - left;Decreased dorsiflexion - right;Festinating  festinating in small spaces such as bathroom and turns   Ambulation Surface Level;Indoor   Gait Comments 2nd 3 minute walk test:  395 ft with RW with min guard assistance with episode of R foot catching.  Verbal cues provided for improved heelstrike.  Assisted patient in and out of bathroom, providing min guard assistance and verbal cues for turning at sink.    High Level Balance   High Level Balance Activities --  Marching in place x  10 reps   High Level Balance Comments At counter:  side step and weightshift, then forward step and weightshift consecutive leg reps x 10, then alternating legs x 10 with UE support                  PT Short Term Goals - 09/22/15 1005    PT SHORT TERM GOAL #1   Title Pt will perform HEP for Parkinson's-specific deficits with wife's supervision/assistance.  TARGET 09/22/15   Baseline per wife's report-sit<>stand transfers for HEP   Time 4   Period Weeks   Status Achieved   PT SHORT TERM GOAL #2   Title Pt will perform at least 6 of 10 reps of sit<>stand transfers from 18-20 inch surfaces with UE support with no evidence of posterior lean, for improved safety and efficiency of transfers.   Time 4   Period Weeks   Status Achieved   PT SHORT TERM GOAL #3   Title Pt will improve TUG score to less than or equal to 20 seconds for decreased fall risk.   Time 4   Period Weeks   Status Not Met   PT SHORT TERM GOAL #4   Title Pt/wife will verbalize understanding of techniques to decrease festinating/freezing of gait.   Baseline Wife verbalizes understanding and provides cues for pt.   Time 4   Period Weeks   Status Partially Met           PT Long Term Goals - 08/28/15 2154    PT LONG TERM GOAL #1   Title Pt/wife will verbalize understanding of fall prevention within the home.  TARGET 10/23/15   Time 8   Period Weeks   Status New   PT LONG TERM GOAL #2   Title Pt will improve Berg Balance score to at least 28/56 for decreased fall risk.   Time 8   Period Weeks   Status New   PT LONG TERM GOAL #3   Title Pt will improve gait velocity to at least 2.62 ft/sec for improved gait efficiency and safety.   Time 8   Period Weeks   Status New   PT LONG TERM GOAL #4   Title Pt will perform floor>stand transfers with minimal assistance and UE support for safe fall recovery.   Time 8   Period Weeks   Status New   PT LONG TERM GOAL #5   Title Pt will verbalize plans for  continued community fitness upon D/C from PT.   Time 8   Period Weeks   Status New               Plan - 10/12/15 1233    Clinical Impression Statement Pt has one episode today of R foot catching with gait, but pt able to maintain balance during gait with RW.  Pt demonstrates 2-3 episodes of posterior lean with sit<>stand transfers, but responds well to cues for use of increased momentum to get up from low surfaces.  Pt will continue to benefit from further skilled PT to address functional strength, balance and gait safety.   Pt will benefit from skilled therapeutic intervention in order to improve on the following deficits Abnormal gait;Decreased balance;Decreased mobility;Decreased strength;Decreased safety awareness;Difficulty walking;Postural dysfunction   Rehab Potential Good   PT Frequency 2x / week   PT Duration 8 weeks  plus eval   PT Treatment/Interventions ADLs/Self Care Home Management;Therapeutic exercise;Therapeutic activities;Functional mobility training;Gait training;Balance training;Neuromuscular re-education;Patient/family education   PT Next Visit Plan Continue functional strengthening, use of machines, standing balance at counter as appropriate (may consider adding forward step and weightshift to HEP next visit; discuss walking program for home  G CODE NEXT VISIT   Consulted and Agree with Plan of Care Patient;Family member/caregiver   Family Member Consulted daughter and grandchildren        Problem List Patient Active Problem List   Diagnosis Date Noted  . Lumbar stenosis 06/06/2015  . HOCM (hypertrophic obstructive cardiomyopathy) ([place]) 12/12/2014  . Infection of right prepatellar bursa 10/18/2014  . DOE (dyspnea on exertion) 09/06/2014  . Fatigue due to excessive exertion 09/06/2014  . Testosterone deficiency 11/12/2013  . Routine general medical examination at a health care facility 08/30/2013  . Mixed hyperlipidemia 08/30/2013  . Other abnormal  glucose 08/30/2013  . 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 Parkinsonism ([place]) 01/16/2013  . INSOMNIA 06/06/2008  . GLAUCOMA 06/01/2008  . Essential hypertension 06/01/2008  . Seasonal and perennial allergic rhinitis 06/01/2008    Karin Griffith W. 10/12/2015, 12:37 PM  Frazier Butt., PT  [facility] [address] [place], [place], [address] Phone: [phone]   Fax:  [phone]  Name: Marcia Lepera MRN: [id number] Date of Birth: [birth year]

## 2015-10-13 ENCOUNTER — Ambulatory Visit: Payer: Medicare Other

## 2015-10-13 ENCOUNTER — Ambulatory Visit: Payer: Medicare Other | Admitting: Occupational Therapy

## 2015-10-13 ENCOUNTER — Ambulatory Visit: Payer: Medicare Other | Admitting: Physical Therapy

## 2015-10-13 DIAGNOSIS — R29898 Other symptoms and signs involving the musculoskeletal system: Secondary | ICD-10-CM

## 2015-10-13 DIAGNOSIS — R1312 Dysphagia, oropharyngeal phase: Secondary | ICD-10-CM

## 2015-10-13 DIAGNOSIS — R471 Dysarthria and anarthria: Secondary | ICD-10-CM

## 2015-10-13 DIAGNOSIS — R258 Other abnormal involuntary movements: Secondary | ICD-10-CM | POA: Diagnosis not present

## 2015-10-13 DIAGNOSIS — R279 Unspecified lack of coordination: Secondary | ICD-10-CM

## 2015-10-13 DIAGNOSIS — R269 Unspecified abnormalities of gait and mobility: Secondary | ICD-10-CM | POA: Diagnosis not present

## 2015-10-13 DIAGNOSIS — R278 Other lack of coordination: Secondary | ICD-10-CM

## 2015-10-13 DIAGNOSIS — R4189 Other symptoms and signs involving cognitive functions and awareness: Secondary | ICD-10-CM | POA: Diagnosis not present

## 2015-10-13 DIAGNOSIS — R2681 Unsteadiness on feet: Secondary | ICD-10-CM | POA: Diagnosis not present

## 2015-10-13 DIAGNOSIS — R6889 Other general symptoms and signs: Secondary | ICD-10-CM

## 2015-10-13 DIAGNOSIS — R2689 Other abnormalities of gait and mobility: Secondary | ICD-10-CM

## 2015-10-13 NOTE — Patient Instructions (Signed)
Have [facility] stay in the room with you as you complete your swallowing exercises to cue you to do them correctly.

## 2015-10-13 NOTE — Therapy (Signed)
[facility] [address] [place], [place], [address] Phone: [phone]   Fax:  [phone]  Speech Language Pathology Treatment  Patient Details  Name: [name] MRN: [id number] Date of Birth: 02/05/1946 No Data Recorded  Encounter Date: 10/13/2015      End of Session - 10/13/15 1532    Visit Number 12   Number of Visits 17   Date for SLP Re-Evaluation 10/27/15   SLP Start Time 1403   SLP Stop Time  1445   SLP Time Calculation (min) 42 min   Activity Tolerance Patient tolerated treatment well      Past Medical History  Diagnosis Date  . HTN (hypertension)   . Glaucoma   . Allergic rhinitis   . Parkinsonism ([facility]) 01/16/2013  . Hyperlipidemia   . Thyroid disease     HYPOTHYROID  . Hypogonadism male   . Pre-diabetes   . Complication of anesthesia     Slow to awaken  . Dysphagia   . Hypothyroidism   . Constipation   . GERD (gastroesophageal reflux disease)     Decdreased, has decreased coffee intack.  . Arthritis   . DDD (degenerative disc disease), cervical   . Cancer ([facility])     basal cell skin cancer  . OSA (obstructive sleep apnea)     Has had surgery x 2    Past Surgical History  Procedure Laterality Date  . Uvulopalatopharyngoplasty    . Lumbar disc surgery    . Cataract extraction    . I&d extremity Right 10/18/2014    Procedure: IRRIGATION AND DEBRIDEMENT RIGHT KNEE PREPATELLA BURSA INFECTION;  Surgeon: Nadara Mustard, MD;  Location: [facility];  Service: Orthopedics;  Laterality: Right;  . Tonsillectomy      and adenoids  . Nasal septum surgery    . Lumbar laminectomy with coflex 1 level N/A 06/06/2015    Procedure: Lumbar three-four Laminectomy/Foraminotomy with placement of Coflex;  Surgeon: Barnett Abu, MD;  Location: [facility];  Service: Neurosurgery;  Laterality: N/A;    There were no vitals filed for this visit.  Visit Diagnosis: Dysarthria  Oropharyngeal dysphagia       Subjective Assessment - 10/13/15 1509    Subjective Pt's wife sat in hallway.               ADULT SLP TREATMENT - 10/13/15 1509    General Information   Behavior/Cognition Alert;Pleasant mood;Cooperative   Treatment Provided   Treatment provided Cognitive-Linquistic;Dysphagia   Dysphagia Treatment   Temperature Spikes Noted No   Treatment Methods Therapeutic exercise;Patient/caregiver education;Skilled observation   Patient observed directly with PO's Yes   Type of PO's observed Thin liquids   Pharyngeal Phase Signs & Symptoms Wet vocal quality;Immediate cough   Other treatment/comments Tongue protrusion for [place], tongue down/mouth open for vocal adduction, space between "ga"'s were needed for cues from SLP. SLP suggested pt have an observer in the room with him during HEP completion to ensure correct completion.    Pain Assessment   Pain Assessment No/denies pain   Cognitive-Linquistic Treatment   Treatment focused on Dysarthria   Skilled Treatment Pt read 10+ word quotes with metronome and obstruction (piece of paper) as cues to reduce rate of speech. However, no carryover seen when one or both cues removed.   Assessment / Recommendations / Plan   Plan Continue with current plan of care  likely discharge next week   Progression Toward Goals   Progression toward goals Not  progressing toward goals (comment)  essentially same cues needed today as session 2 weeks ago            SLP Short Term Goals - 09/27/15 1650    SLP SHORT TERM GOAL #1   Title pt will perform HEP with occasional min A   Period Weeks   Status Achieved   SLP SHORT TERM GOAL #2   Title pt will demo swallow precautions with POs to remain safe during meals with occasional min A   Time 1   Period Weeks   Status Not Met          SLP Long Term Goals - 10/13/15 1536    SLP LONG TERM GOAL #1   Title pt will perform HEP to improve oropharyneal swallowing ability with rare min A   Time 2    Period Weeks   Status On-going   SLP LONG TERM GOAL #2   Title pt will follow swallow precautions with rare mod A   Time 2   Period Weeks   Status On-going   SLP LONG TERM GOAL #3   Title wife/caregiver will assist patient in completing HEP with modified independence (use of HEP form)   Time 2   Period Weeks   Status On-going   SLP LONG TERM GOAL #4   Title pt will demo 75% intelligiblity with spontaneous sentence responses   Status Achieved   SLP LONG TERM GOAL #5   Title pt will demo 85% intelligibility in simple conversation with occasional min A   Time 2   Period Weeks   Status New          Plan - 10/13/15 1535    Clinical Impression Statement Pt with same cues needed for reduced speech rate as well as with swallowing HEP as two weeks ago. Pt has likely reached max rehab potential at this time. Discharge liekly next week.   Speech Therapy Frequency 2x / week   Duration 2 weeks   Treatment/Interventions Aspiration precaution training;Pharyngeal strengthening exercises;Diet toleration management by SLP;Compensatory techniques;Environmental controls   Potential to Achieve Goals Fair   Potential Considerations Previous level of function;Severity of impairments        Problem List Patient Active Problem List   Diagnosis Date Noted  . Lumbar stenosis 06/06/2015  . HOCM (hypertrophic obstructive cardiomyopathy) ([place]) 12/12/2014  . Infection of right prepatellar bursa 10/18/2014  . DOE (dyspnea on exertion) 09/06/2014  . Fatigue due to excessive exertion 09/06/2014  . Testosterone deficiency 11/12/2013  . Routine general medical examination at a health care facility 08/30/2013  . Mixed hyperlipidemia 08/30/2013  . Other abnormal glucose 08/30/2013  . 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 Parkinsonism ([place]) 01/16/2013  . INSOMNIA 06/06/2008  . GLAUCOMA 06/01/2008  . Essential hypertension 06/01/2008  . Seasonal and perennial allergic rhinitis 06/01/2008     [facility] , MS, CCC-SLP 10/13/2015, 4:25 PM  [facility] [address] [place], [place], [address] Phone: [phone]   Fax:  [phone]   Name: [name] MRN: [id number] Date of Birth: 12-14-1945

## 2015-10-13 NOTE — Therapy (Signed)
[facility] [address] [place], [place], [address] Phone: [phone]   Fax:  [phone]  Occupational Therapy Treatment  Patient Details  Name: [name] MRN: [id number] Date of Birth: [birth year] Referring Provider: Dr. Guadelupe Sabin  Encounter Date: 10/13/2015      OT End of Session - 10/13/15 1727    Visit Number 10   Number of Visits 17   Date for OT Re-Evaluation 10/23/15   Authorization Type Medicare primary, BCBS secondary--need G-code   Authorization Time Period Pt is scheduled through 10/19/15   Authorization - Visit Number 10   Authorization - Number of Visits 10   OT Start Time 5638   OT Stop Time 1445   OT Time Calculation (min) 40 min   Activity Tolerance Patient tolerated treatment well   Behavior During Therapy [facility] for tasks assessed/performed      Past Medical History  Diagnosis Date  . HTN (hypertension)   . Glaucoma   . Allergic rhinitis   . Parkinsonism ([place]) 01/16/2013  . Hyperlipidemia   . Thyroid disease     HYPOTHYROID  . Hypogonadism male   . Pre-diabetes   . Complication of anesthesia     Slow to awaken  . Dysphagia   . Hypothyroidism   . Constipation   . GERD (gastroesophageal reflux disease)     Decdreased, has decreased coffee intack.  . Arthritis   . DDD (degenerative disc disease), cervical   . Cancer ([place])     basal cell skin cancer  . OSA (obstructive sleep apnea)     Has had surgery x 2    Past Surgical History  Procedure Laterality Date  . Uvulopalatopharyngoplasty    . Lumbar disc surgery    . Cataract extraction    . I&d extremity Right 10/18/2014    Procedure: IRRIGATION AND DEBRIDEMENT RIGHT KNEE PREPATELLA BURSA INFECTION;  Surgeon: [name], MD;  Location: [facility];  Service: Orthopedics;  Laterality: Right;  . Tonsillectomy      and adenoids  . Nasal septum surgery    . Lumbar laminectomy with coflex 1 level N/A 06/06/2015    Procedure: Lumbar  three-four Laminectomy/Foraminotomy with placement of Coflex;  Surgeon: [name], MD;  Location: [facility];  Service: Neurosurgery;  Laterality: N/A;    There were no vitals filed for this visit.  Visit Diagnosis:  Hypokinesia  Decreased coordination  Decreased functional mobility  Rigidity  Cognitive deficits  Unsteadiness      Subjective Assessment - 10/13/15 1408    Subjective  Wife/pt reports brushing teeth is better, but inconsistent performance with other activities   Patient is accompained by: Family member  wife   Pertinent History see Epic   Patient Stated Goals Per pt/wife:  improve writing, brushing hair, picking up cups, brushing teeth, washing hair, pulling down shirt, cutting food.   Currently in Pain? No/denies                      OT Treatments/Exercises (OP) - 10/13/15 1734    ADLs   UB Dressing Practiced simulated donning/doffing shirt over head and reaching to pull down shirt down in back with each hand with bag with min cues for large amplitude movements.   Bathing Washing hands with min cueing for large amplitude movements.   Simulated washing hair with min cues for large amplitude movement strategies.   Functional Mobility Transfers with min cueing for strategies for incr safety.  Neurological Re-education Exercises   Other Exercises 1 Functional reaching in various locations to grasp cylinder objects with min cues for large amplitude movements (incorporated wt. shifts, trunk rotation)                OT Education - 10/13/15 1739    Education Details PWR! up, rock, step in sitting.   Person(s) Educated Patient;Spouse   Methods Explanation;Demonstration;Verbal cues   Comprehension Verbalized understanding;Returned demonstration;Verbal cues required  min cues          OT Short Term Goals - 10/13/15 1727    OT SHORT TERM GOAL #1   Title Pt will perform updated HEP with supervision/min cues.--check STGs 09/22/15    Status Achieved  at approx this level 10/13/15   OT SHORT TERM GOAL #2   Title Pt will write name with at least 75% legibility.   Status Achieved   OT SHORT TERM GOAL #3   Title Pt will report incr ease with washing hair.   Baseline inconsistent performance 09/28/15   Status On-going   OT SHORT TERM GOAL #4   Title Pt will pick up cup using big amplitude movements in 5/10 attempts.   Status Achieved   OT SHORT TERM GOAL #5   Title Pt will report incr ease with brushing teeth.   Baseline met with electric toothbrush 09/28/15   Status Achieved           OT Long Term Goals - 10/13/15 1727    OT LONG TERM GOAL #1   Title Pt/caregiver will verbalize understanding of adaptive strategies/AE for ADLs prn.--check LTGs 10/23/15   Time 8   Period Weeks   Status Revised  10/13/15  caregiver verbalizes understanding, pt needs reinforcement   OT LONG TERM GOAL #2   Title Pt will be able to don shirt using big amplitude movement strategies.   Time 8   Period Weeks   Status Achieved  10/13/15:  per wife improved use of strategies with min cues   OT LONG TERM GOAL #3   Title Pt will be able to write name and simple phrase with at least 90% legibility.   Time 8   Period Weeks   Status Achieved  10/11/14  Met at approx this level   OT LONG TERM GOAL #4   Title Pt will pick up cup using big amplitude movements in 8/10 attempts.   Time 8   Period Weeks   Status Achieved  10/12/15   OT LONG TERM GOAL #5   Title Pt will be able to brush hair with min A.   Time 8   Period Weeks   Status Achieved  10/13/15 at this level (min cues)   OT LONG TERM GOAL #6   Title Pt will be able to cut food mod I using AE/stratgies prn.   Time 8   Period Weeks   Status New   OT LONG TERM GOAL #7   Title Pt will improve coordination for ADLs as shown by improving time on 9-hole peg test by at [place] with dominant LUE.   Baseline 46.31sec   Time 8   Period Weeks   Status New               Plan -  10/13/15 1730    Clinical Impression Statement Pt is progressing towards goals with less cueing needed and incr consistency with large amplitude movement strategies.  However, safety awareness is still a concern  Pt would benefit from  continued occupational therapy to reinforce large amplitude movement strategies to incr carryover in order to incr ease/performance/safety of ADLs, lessen caregiver burden, and improve quality of life.   Pt will benefit from skilled therapeutic intervention in order to improve on the following deficits (Retired) Decreased cognition;Decreased mobility;Impaired perceived functional ability;Impaired UE functional use;Decreased knowledge of use of DME;Decreased balance;Decreased knowledge of precautions;Impaired tone;Decreased safety awareness;Decreased coordination   Rehab Potential Good   OT Frequency 2x / week   OT Duration 8 weeks   OT Treatment/Interventions Self-care/ADL training;Cryotherapy;Electrical Stimulation;Moist Heat;Passive range of motion;Cognitive remediation/compensation;Fluidtherapy;DME and/or AE instruction;Therapeutic activities;Therapeutic exercises;Energy conservation;Neuromuscular education;Ultrasound;Manual Therapy;Splinting;Patient/family education;Therapist, nutritional;Therapeutic exercise   Plan continue with previous POC, continue to address/reinforce large amplitude movement strategies and safety with functional activity/ADLs   OT Home Exercise Plan Education issued:  HEP for functional activities with focus on timing/big amplitude movements   Consulted and Agree with Plan of Care Patient;Family member/caregiver   Family Member Consulted wife      Occupational Therapy Progress Note  Dates of Reporting Period: 08/24/15 to 11-03-15  Objective Reports of Subjective Statement: see above  Objective Measurements: see above  Goal Update: see above  Plan: see above  Reason Skilled Services are Required: see above      G-Codes -  2015-11-03 1743    Functional Assessment Tool Used needs 24hr supervision for safety/mobility; min cues to brush hair; now able to legibly write name; brushing teeth improved, washing hair difficult; unable to cut food,  able to complete fasteners  and pulling shirt down with min cues   Functional Limitation Self care   Self Care Current Status (I1658) At least 40 percent but less than 60 percent impaired, limited or restricted   Self Care Goal Status (K0634) At least 40 percent but less than 60 percent impaired, limited or restricted      Problem List Patient Active Problem List   Diagnosis Date Noted  . Lumbar stenosis 06/06/2015  . HOCM (hypertrophic obstructive cardiomyopathy) ([place]) 12/12/2014  . Infection of right prepatellar bursa 10/18/2014  . DOE (dyspnea on exertion) 09/06/2014  . Fatigue due to excessive exertion 09/06/2014  . Testosterone deficiency 11/12/2013  . Routine general medical examination at a health care facility 08/30/2013  . Mixed hyperlipidemia 08/30/2013  . Other abnormal glucose 08/30/2013  . 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 Parkinsonism ([place]) 01/16/2013  . INSOMNIA 06/06/2008  . GLAUCOMA 06/01/2008  . Essential hypertension 06/01/2008  . Seasonal and perennial allergic rhinitis 06/01/2008    [facility] 03-Nov-2015, 5:44 PM  [facility] [address] [place], [place], [address] Phone: [phone]   Fax:  [phone]  Name: Mehtab Dolberry MRN: [id number] Date of Birth: [birth year]  Vianne Bulls, OTR/L 11-03-2015 5:44 PM

## 2015-10-13 NOTE — Therapy (Signed)
[facility] [address] [place], [place], [address] Phone: [phone]   Fax:  [phone]  Physical Therapy Treatment  Patient Details  Name: [name] MRN: [id number] Date of Birth: 11-Mar-1946 Referring Provider: Rexene Alberts  Encounter Date: 10/13/2015      PT End of Session - 10/13/15 1415    Visit Number 10   Number of Visits 17   Date for PT Re-Evaluation 10/23/15   Authorization Type Medicare-G-code every 10th visit   PT Start Time 1317   PT Stop Time 1400   PT Time Calculation (min) 43 min   Equipment Utilized During Treatment Gait belt   Activity Tolerance Patient tolerated treatment well   Behavior During Therapy [facility] for tasks assessed/performed      Past Medical History  Diagnosis Date  . HTN (hypertension)   . Glaucoma   . Allergic rhinitis   . Parkinsonism ([place]) 01/16/2013  . Hyperlipidemia   . Thyroid disease     HYPOTHYROID  . Hypogonadism male   . Pre-diabetes   . Complication of anesthesia     Slow to awaken  . Dysphagia   . Hypothyroidism   . Constipation   . GERD (gastroesophageal reflux disease)     Decdreased, has decreased coffee intack.  . Arthritis   . DDD (degenerative disc disease), cervical   . Cancer ([place])     basal cell skin cancer  . OSA (obstructive sleep apnea)     Has had surgery x 2    Past Surgical History  Procedure Laterality Date  . Uvulopalatopharyngoplasty    . Lumbar disc surgery    . Cataract extraction    . I&d extremity Right 10/18/2014    Procedure: IRRIGATION AND DEBRIDEMENT RIGHT KNEE PREPATELLA BURSA INFECTION;  Surgeon: [name], MD;  Location: [facility];  Service: Orthopedics;  Laterality: Right;  . Tonsillectomy      and adenoids  . Nasal septum surgery    . Lumbar laminectomy with coflex 1 level N/A 06/06/2015    Procedure: Lumbar three-four Laminectomy/Foraminotomy with placement of Coflex;  Surgeon: [name], MD;  Location: [place] [facility];   Service: Neurosurgery;  Laterality: N/A;    There were no vitals filed for this visit.  Visit Diagnosis:  Abnormality of gait      Subjective Assessment - 10/12/15 0804    Subjective No falls, no pain.  Forgot the pocket metronome today.  Daughter brought me today-wife had surgery last Friday.   Currently in Pain? No/denies                         [facility] Adult PT Treatment/Exercise - 10/13/15 0001    Transfers   Transfers Sit to Stand;Stand to Sit   Sit to Stand 5: Supervision;Without upper extremity assist;From chair/3-in-1   Sit to Stand Details (indicate cue type and reason) cues for forward weight shift   Stand to Sit 5: Supervision;Without upper extremity assist;To chair/3-in-1   Number of Reps 10 reps   Ambulation/Gait   Gait velocity 2.624 ft/sec +12.5 sec   Berg Balance Test   Sit to Stand Able to stand  independently using hands   Standing Unsupported Able to stand 2 minutes with supervision   Sitting with Back Unsupported but Feet Supported on Floor or Stool Able to sit safely and securely 2 minutes   Stand to Sit Sits safely with minimal use of hands   Transfers Able to transfer safely,  definite need of hands   Standing Unsupported with Eyes Closed Able to stand 10 seconds with supervision   Standing Ubsupported with Feet Together Able to place feet together independently and stand for 1 minute with supervision   From Standing, Reach Forward with Outstretched Arm Can reach forward >5 cm safely (2")   From Standing Position, Pick up Object from Floor Unable to try/needs assist to keep balance   From Standing Position, Turn to Look Behind Over each Shoulder Needs supervision when turning   Turn 360 Degrees Needs close supervision or verbal cueing   Standing Unsupported, Alternately Place Feet on Step/Stool Able to complete >2 steps/needs minimal assist   Standing Unsupported, One Foot in [place] help to step but can hold 15 seconds   Standing on One  Leg Tries to lift leg/unable to hold 3 seconds but remains standing independently   Total Score 30   Timed Up and Go Test   TUG Normal TUG   Normal TUG (seconds) 21.6   High Level Balance   High Level Balance Activities Other (comment)   High Level Balance Comments at counter with 1 UE support for forward step weight shift and backward step weight shift-15 reps with cues for intensity of weight shift   Self-Care   Self-Care Other Self-Care Comments   Other Self-Care Comments  Discussion again [id number] to continue with 2 wheeled RW (vs Rollator).  Wife reports pt tried to use someone else's Rollator at a family function despite recommendations not to.  Discussed not performing components of BERG test due to risk of falls and BERG test was something for [place] to assess his balance but absolutely not something he should try at home.                PT Education - 10/13/15 1413    Education provided Yes   Education Details Progress toward goals, recommendation to continue to use 2 wheeled RW, not performing components of BERG testing at home   Person(s) Educated Patient;Spouse   Methods Explanation   Comprehension Verbalized understanding          PT Short Term Goals - 09/22/15 1005    PT SHORT TERM GOAL #1   Title Pt will perform HEP for Parkinson's-specific deficits with wife's supervision/assistance.  TARGET 09/22/15   Baseline per wife's report-sit<>stand transfers for HEP   Time 4   Period Weeks   Status Achieved   PT SHORT TERM GOAL #2   Title Pt will perform at least 6 of 10 reps of sit<>stand transfers from 18-20 inch surfaces with UE support with no evidence of posterior lean, for improved safety and efficiency of transfers.   Time 4   Period Weeks   Status Achieved   PT SHORT TERM GOAL #3   Title Pt will improve TUG score to less than or equal to 20 seconds for decreased fall risk.   Time 4   Period Weeks   Status Not Met   PT SHORT TERM GOAL #4   Title  Pt/wife will verbalize understanding of techniques to decrease festinating/freezing of gait.   Baseline Wife verbalizes understanding and provides cues for pt.   Time 4   Period Weeks   Status Partially Met           PT Long Term Goals - 10/13/15 1414    PT LONG TERM GOAL #1   Title Pt/wife will verbalize understanding of fall prevention within the home.  TARGET 10/23/15  Time 8   Period Weeks   Status New   PT LONG TERM GOAL #2   Title Pt will improve Berg Balance score to at least 28/56 for decreased fall risk.   Baseline 30/56 on October 23, 2015   Time 8   Period Weeks   Status Achieved   PT LONG TERM GOAL #3   Title Pt will improve gait velocity to at least 2.62 ft/sec for improved gait efficiency and safety.   Baseline 2.624 ft/sec on 23-Oct-2015   Time 8   Period Weeks   Status Achieved   PT LONG TERM GOAL #4   Title Pt will perform floor>stand transfers with minimal assistance and UE support for safe fall recovery.   Time 8   Period Weeks   Status New   PT LONG TERM GOAL #5   Title Pt will verbalize plans for continued community fitness upon D/C from PT.   Time 8   Period Weeks   Status New               Plan - 10-23-2015 1415    Clinical Impression Statement Pt met LTG's 2 and 3.  Continues to be impulsive in clinic and at home and needs constant supervision to prevent falls.  Continue PT per POC.   Pt will benefit from skilled therapeutic intervention in order to improve on the following deficits Abnormal gait;Decreased balance;Decreased mobility;Decreased strength;Decreased safety awareness;Difficulty walking;Postural dysfunction   Rehab Potential Good   PT Frequency 2x / week   PT Duration 8 weeks  plus eval   PT Treatment/Interventions ADLs/Self Care Home Management;Therapeutic exercise;Therapeutic activities;Functional mobility training;Gait training;Balance training;Neuromuscular re-education;Patient/family education   PT Next Visit Plan Review pt's current HEP  (pt to bring handouts in next session), fall preventions, finish checking goals  G CODE NEXT VISIT   Consulted and Agree with Plan of Care Patient;Family member/caregiver   Family Member Consulted wife          G-Codes - Oct 23, 2015 1417    Functional Assessment Tool Used TUG 21.6 seconds with RW, gait velocity 2.624 ft/sec, BERG 30/56      Problem List Patient Active Problem List   Diagnosis Date Noted  . Lumbar stenosis 06/06/2015  . HOCM (hypertrophic obstructive cardiomyopathy) ([place]) 12/12/2014  . Infection of right prepatellar bursa 10/18/2014  . DOE (dyspnea on exertion) 09/06/2014  . Fatigue due to excessive exertion 09/06/2014  . Testosterone deficiency 11/12/2013  . Routine general medical examination at a health care facility 08/30/2013  . Mixed hyperlipidemia 08/30/2013  . Other abnormal glucose 08/30/2013  . 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 Parkinsonism ([place]) 01/16/2013  . INSOMNIA 06/06/2008  . GLAUCOMA 06/01/2008  . Essential hypertension 06/01/2008  . Seasonal and perennial allergic rhinitis 06/01/2008    Narda Bonds Oct 23, 2015, 2:18 PM  [facility] [address] [place], [place], [address] Phone: [phone]   Fax:  [phone]  Name: [name] MRN: [id number] Date of Birth: Jul 01, 1946    Narda Bonds, [place] 23-Oct-2015 2:18 PM Phone: [phone] Fax: [phone]  Addendum: G Codes Functional Assessment Tool: Merrilee Jansky 30/56, TUG 21.6, gait velocity 2.62 ft/sec Functional Limitation:  Mobility: Walking and Moving Around Current Status  [phone]): CK Goal Status  [phone]): CJ       Physical Therapy Progress Note  Dates of Reporting Period: 08/24/15 to 10-23-2015  Objective Reports of Subjective Statement: Pt has had one fall, continues to need constant supervision due to  loss of balance with  distractions.  Objective Measurements: TUG 21.6 sec (improved from 25.49 at eval), gait velocity 2.62 ft/sec (improved from 2.28 ft/sec at eval), Merrilee Jansky 30/56 (improved from 19/56 at eval)  Goal Update: Note goal assessment in note-pt has achieved LTG for Berg Balance test improvement.  Plan: Plan to continue therapy through end of POC next week.  Reason Skilled Services are Required: Continued fall risk, decreased safety awareness, continued pt/caregiver instruction on progression of HEP, safety at home with mobility.  Mady Haagensen, PT 10/14/2015 1:45 PM Phone: [phone] Fax: [phone]

## 2015-10-18 ENCOUNTER — Ambulatory Visit: Payer: Medicare Other | Admitting: Physical Therapy

## 2015-10-18 ENCOUNTER — Ambulatory Visit: Payer: Medicare Other | Admitting: Occupational Therapy

## 2015-10-18 ENCOUNTER — Ambulatory Visit: Payer: Medicare Other

## 2015-10-19 ENCOUNTER — Ambulatory Visit: Payer: Medicare Other | Admitting: Occupational Therapy

## 2015-10-19 ENCOUNTER — Ambulatory Visit: Payer: Medicare Other | Admitting: Physical Therapy

## 2015-10-19 ENCOUNTER — Ambulatory Visit: Payer: Medicare Other

## 2015-10-19 DIAGNOSIS — R1312 Dysphagia, oropharyngeal phase: Secondary | ICD-10-CM | POA: Diagnosis not present

## 2015-10-19 DIAGNOSIS — R278 Other lack of coordination: Secondary | ICD-10-CM

## 2015-10-19 DIAGNOSIS — R2681 Unsteadiness on feet: Secondary | ICD-10-CM

## 2015-10-19 DIAGNOSIS — R2689 Other abnormalities of gait and mobility: Secondary | ICD-10-CM

## 2015-10-19 DIAGNOSIS — R279 Unspecified lack of coordination: Secondary | ICD-10-CM

## 2015-10-19 DIAGNOSIS — R4189 Other symptoms and signs involving cognitive functions and awareness: Secondary | ICD-10-CM | POA: Diagnosis not present

## 2015-10-19 DIAGNOSIS — R258 Other abnormal involuntary movements: Secondary | ICD-10-CM

## 2015-10-19 DIAGNOSIS — R269 Unspecified abnormalities of gait and mobility: Secondary | ICD-10-CM

## 2015-10-19 DIAGNOSIS — R29898 Other symptoms and signs involving the musculoskeletal system: Secondary | ICD-10-CM

## 2015-10-19 DIAGNOSIS — R471 Dysarthria and anarthria: Secondary | ICD-10-CM

## 2015-10-19 NOTE — Patient Instructions (Signed)
Tips to reduce freezing episodes with standing or walking:   Stand tall with your feet wide, so that you can rock and weight shift through your hips.  Don't try to fight the freeze: if you begin taking slower, faster, smaller steps, STOP, get your posture tall, and RESET your posture and balance.  Take a deep breath before taking the BIG step to start again.  March in place, with high knee stepping, to get started walking again.  Use auditory cues:  Count out loud, think of a familiar tune or song or cadence, use pocket metronome, to use rhythm to get started walking again.  Use visual cues:  Use a line to step over, use laser pointer line to step over, (using BIG steps) to start walking again.  Use visual targets to keep your posture tall (look ahead and focus on an object or target at eye level).  As you approach where your destination with walking, count your steps out loud and/or focus on your target with your eyes until you are fully there.  Use appropriate assistive device, as advised by your physical therapist to assist with taking longer, consistent steps.   Fall Prevention in the Home  Falls can cause injuries and can affect people from all age groups. There are many simple things that you can do to make your home safe and to help prevent falls. WHAT CAN I DO ON THE OUTSIDE OF MY HOME?  Regularly repair the edges of walkways and driveways and fix any cracks.  Remove high doorway thresholds.  Trim any shrubbery on the main path into your home.  Use bright outdoor lighting.  Clear walkways of debris and clutter, including tools and rocks.  Regularly check that handrails are securely fastened and in good repair. Both sides of any steps should have handrails.  Install guardrails along the edges of any raised decks or porches.  Have leaves, snow, and ice cleared regularly.  Use sand or salt on walkways during winter months.  In the garage, clean up any spills right away,  including grease or oil spills. WHAT CAN I DO IN THE BATHROOM?  Use night lights.  Install grab bars by the toilet and in the tub and shower. Do not use towel bars as grab bars.  Use non-skid mats or decals on the floor of the tub or shower.  If you need to sit down while you are in the shower, use a plastic, non-slip stool.[place]  Keep the floor dry. Immediately clean up any water that spills on the floor.  Remove soap buildup in the tub or shower on a regular basis.  Attach bath mats securely with double-sided non-slip rug tape.  Remove throw rugs and other tripping hazards from the floor. WHAT CAN I DO IN THE BEDROOM?  Use night lights.  Make sure that a bedside light is easy to reach.  Do not use oversized bedding that drapes onto the floor.  Have a firm chair that has side arms to use for getting dressed.  Remove throw rugs and other tripping hazards from the floor. WHAT CAN I DO IN THE KITCHEN?   Clean up any spills right away.  Avoid walking on wet floors.  Place frequently used items in easy-to-reach places.  If you need to reach for something above you, use a sturdy step stool that has a grab bar.  Keep electrical cables out of the way.  Do not use floor polish or wax that makes floors slippery. If  you have to use wax, make sure that it is non-skid floor wax.  Remove throw rugs and other tripping hazards from the floor. WHAT CAN I DO IN THE STAIRWAYS?  Do not leave any items on the stairs.  Make sure that there are handrails on both sides of the stairs. Fix handrails that are broken or loose. Make sure that handrails are as long as the stairways.  Check any carpeting to make sure that it is firmly attached to the stairs. Fix any carpet that is loose or worn.  Avoid having throw rugs at the top or bottom of stairways, or secure the rugs with carpet tape to prevent them from moving.  Make sure that you have a light switch at the top of the stairs and the bottom  of the stairs. If you do not have them, have them installed. WHAT ARE SOME OTHER FALL PREVENTION TIPS?  Wear closed-toe shoes that fit well and support your feet. Wear shoes that have rubber soles or low heels.  When you use a stepladder, make sure that it is completely opened and that the sides are firmly locked. Have someone hold the ladder while you are using it. Do not climb a closed stepladder.  Add color or contrast paint or tape to grab bars and handrails in your home. Place contrasting color strips on the first and last steps.  Use mobility aids as needed, such as canes, walkers, scooters, and crutches.  Turn on lights if it is dark. Replace any light bulbs that burn out.  Set up furniture so that there are clear paths. Keep the furniture in the same spot.  Fix any uneven floor surfaces.  Choose a carpet design that does not hide the edge of steps of a stairway.  Be aware of any and all pets.  Review your medicines with your healthcare provider. Some medicines can cause dizziness or changes in blood pressure, which increase your risk of falling. Talk with your health care provider about other ways that you can decrease your risk of falls. This may include working with a physical therapist or trainer to improve your strength, balance, and endurance.   This information is not intended to replace advice given to you by your health care provider. Make sure you discuss any questions you have with your health care provider.   Document Released: 09/14/2002 Document Revised: 02/08/2015 Document Reviewed: 10/29/2014 Elsevier Interactive Patient Education [employer].

## 2015-10-20 ENCOUNTER — Encounter: Payer: Self-pay | Admitting: *Deleted

## 2015-10-20 NOTE — Progress Notes (Signed)
   Subjective:    Patient ID: [name], male    DOB: Feb 21, 1946, 70 y.o.   MRN: [id number]  HPI    Review of Systems     Objective:   Physical Exam        Assessment & Plan:

## 2015-10-20 NOTE — Therapy (Signed)
[facility] [address] [place], [place], [address] Phone: [phone]   Fax:  [phone]  Physical Therapy Treatment  Patient Details  Name: [name] MRN: [id number] Date of Birth: [birth year] Referring Provider: Rexene Alberts  Encounter Date: 10/19/2015      PT End of Session - 10/20/15 1518    Visit Number 11   Number of Visits 17   Date for PT Re-Evaluation 10/23/15   Authorization Type Medicare-G-code every 10th visit   PT Start Time 1405   PT Stop Time 1445   PT Time Calculation (min) 40 min   Equipment Utilized During Treatment Gait belt   Activity Tolerance Patient tolerated treatment well   Behavior During Therapy [facility] for tasks assessed/performed      Past Medical History  Diagnosis Date  . HTN (hypertension)   . Glaucoma   . Allergic rhinitis   . Parkinsonism ([place]) 01/16/2013  . Hyperlipidemia   . Thyroid disease     HYPOTHYROID  . Hypogonadism male   . Pre-diabetes   . Complication of anesthesia     Slow to awaken  . Dysphagia   . Hypothyroidism   . Constipation   . GERD (gastroesophageal reflux disease)     Decdreased, has decreased coffee intack.  . Arthritis   . DDD (degenerative disc disease), cervical   . Cancer ([place])     basal cell skin cancer  . OSA (obstructive sleep apnea)     Has had surgery x 2    Past Surgical History  Procedure Laterality Date  . Uvulopalatopharyngoplasty    . Lumbar disc surgery    . Cataract extraction    . I&d extremity Right 10/18/2014    Procedure: IRRIGATION AND DEBRIDEMENT RIGHT KNEE PREPATELLA BURSA INFECTION;  Surgeon: [name], MD;  Location: [facility];  Service: Orthopedics;  Laterality: Right;  . Tonsillectomy      and adenoids  . Nasal septum surgery    . Lumbar laminectomy with coflex 1 level N/A 06/06/2015    Procedure: Lumbar three-four Laminectomy/Foraminotomy with placement of Coflex;  Surgeon: [name], MD;  Location: [place] [facility];   Service: Neurosurgery;  Laterality: N/A;    There were no vitals filed for this visit.  Visit Diagnosis:  Abnormality of gait      Subjective Assessment - 10/19/15 1405    Subjective Denies falls or pain.   Patient is accompained by: Family member  wife   Pertinent History Back surgery August 2016; spinal lesion C6 (on MRI-MD not planning to do further work-up)   Patient Stated Goals Pt's goal for therapy is to improve balance and walking   Currently in Pain? No/denies       Performed floor>standing transfers with patient with UE support and min guard on multiple trials.  Did need min-mod cues to amplitude of movement to make transfer easier and decrease reliance on UE's.  Remaining session focused on patient/family education concerning fall prevention and tips to avoid freezing and overall safety with mobility including recommendation to continue to use RW at all times at home.  Also verbally reviewed patients HEP with pt and wife.  Both report pt is walking around house through out the day with assistance and plan to continue HEP. Both state no further concerns or questions for PT.      PT Education - 10/20/15 1517    Education provided Yes   Education Details Fall Prevention, tips to reduce freezing, floor>mat transfers  Person(s) Educated Patient;Spouse   Methods Explanation;Demonstration;Handout;Verbal cues   Comprehension Verbalized understanding;Returned demonstration          PT Short Term Goals - 09/22/15 1005    PT SHORT TERM GOAL #1   Title Pt will perform HEP for Parkinson's-specific deficits with wife's supervision/assistance.  TARGET 09/22/15   Baseline per wife's report-sit<>stand transfers for HEP   Time 4   Period Weeks   Status Achieved   PT SHORT TERM GOAL #2   Title Pt will perform at least 6 of 10 reps of sit<>stand transfers from 18-20 inch surfaces with UE support with no evidence of posterior lean, for improved safety and efficiency of transfers.    Time 4   Period Weeks   Status Achieved   PT SHORT TERM GOAL #3   Title Pt will improve TUG score to less than or equal to 20 seconds for decreased fall risk.   Time 4   Period Weeks   Status Not Met   PT SHORT TERM GOAL #4   Title Pt/wife will verbalize understanding of techniques to decrease festinating/freezing of gait.   Baseline Wife verbalizes understanding and provides cues for pt.   Time 4   Period Weeks   Status Partially Met           PT Long Term Goals - Nov 11, 2015 1518    PT LONG TERM GOAL #1   Title Pt/wife will verbalize understanding of fall prevention within the home.  TARGET 10/23/15   Time 8   Period Weeks   Status Achieved   PT LONG TERM GOAL #2   Title Pt will improve Berg Balance score to at least 28/56 for decreased fall risk.   Baseline 30/56 on 10/13/15   Time 8   Period Weeks   Status Achieved   PT LONG TERM GOAL #3   Title Pt will improve gait velocity to at least 2.62 ft/sec for improved gait efficiency and safety.   Baseline 2.624 ft/sec on 10/13/15   Time 8   Period Weeks   Status Achieved   PT LONG TERM GOAL #4   Title Pt will perform floor>stand transfers with minimal assistance and UE support for safe fall recovery.   Time 8   Period Weeks   Status Achieved   PT LONG TERM GOAL #5   Title Pt will verbalize plans for continued community fitness upon D/C from PT.   Time 8   Period Weeks   Status Achieved               Plan - 2015-11-11 1519    Clinical Impression Statement Pt has met all LTG's.  Wife and patient feel ready for d/c.  Pt still needs frequent cues for safety as well as proper techique with mobility.  Discharge from PT per Mady Haagensen, PT.   PT Duration --  plus eval   PT Next Visit Plan Discharge from PT per Mady Haagensen, PT.    Consulted and Agree with Plan of Care Patient;Family member/caregiver   Family Member Consulted wife          G-Codes - 2015/11/11 12-29-20    Functional Assessment Tool Used TUG 21.6  seconds with RW, gait velocity 2.62 ft/sec with RW, BERG 30/56      Problem List Patient Active Problem List   Diagnosis Date Noted  . Lumbar stenosis 06/06/2015  . HOCM (hypertrophic obstructive cardiomyopathy) ([place]) 12/12/2014  . Infection of right prepatellar bursa 10/18/2014  . DOE (dyspnea  on exertion) 09/06/2014  . Fatigue due to excessive exertion 09/06/2014  . Testosterone deficiency 11/12/2013  . Routine general medical examination at a health care facility 08/30/2013  . Mixed hyperlipidemia 08/30/2013  . Other abnormal glucose 08/30/2013  . 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 Parkinsonism ([place]) 01/16/2013  . INSOMNIA 06/06/2008  . GLAUCOMA 06/01/2008  . Essential hypertension 06/01/2008  . Seasonal and perennial allergic rhinitis 06/01/2008    Narda Bonds 10/20/2015, 3:23 PM  [facility] [address] [place], [place], [address] Phone: [phone]   Fax:  [phone]  Name: Shinichi Anguiano MRN: [id number] Date of Birth: 11-02-1945    Narda Bonds, [place] 10/20/2015 3:23 PM Phone: [phone] Fax: [phone]

## 2015-10-20 NOTE — Therapy (Signed)
[facility] [address] [place], [place], [address] Phone: [phone]   Fax:  [phone]  Occupational Therapy Treatment  Patient Details  Name: [name] MRN: [id number] Date of Birth: [birth year] Referring Provider: Dr. Guadelupe Sabin  Encounter Date: 10/19/2015      OT End of Session - 10/20/15 1357    Visit Number 11   Number of Visits 17   Date for OT Re-Evaluation 10/23/15   Authorization Type Medicare primary, BCBS secondary--need G-code   Authorization Time Period Pt is scheduled through 10/19/15   Authorization - Visit Number 11   Authorization - Number of Visits 20   OT Start Time 1316   OT Stop Time 1400   OT Time Calculation (min) 44 min   Activity Tolerance Patient tolerated treatment well   Behavior During Therapy [facility] for tasks assessed/performed      Past Medical History  Diagnosis Date  . HTN (hypertension)   . Glaucoma   . Allergic rhinitis   . Parkinsonism ([place]) 01/16/2013  . Hyperlipidemia   . Thyroid disease     HYPOTHYROID  . Hypogonadism male   . Pre-diabetes   . Complication of anesthesia     Slow to awaken  . Dysphagia   . Hypothyroidism   . Constipation   . GERD (gastroesophageal reflux disease)     Decdreased, has decreased coffee intack.  . Arthritis   . DDD (degenerative disc disease), cervical   . Cancer ([place])     basal cell skin cancer  . OSA (obstructive sleep apnea)     Has had surgery x 2    Past Surgical History  Procedure Laterality Date  . Uvulopalatopharyngoplasty    . Lumbar disc surgery    . Cataract extraction    . I&d extremity Right 10/18/2014    Procedure: IRRIGATION AND DEBRIDEMENT RIGHT KNEE PREPATELLA BURSA INFECTION;  Surgeon: [name], MD;  Location: [facility];  Service: Orthopedics;  Laterality: Right;  . Tonsillectomy      and adenoids  . Nasal septum surgery    . Lumbar laminectomy with coflex 1 level N/A 06/06/2015    Procedure: Lumbar  three-four Laminectomy/Foraminotomy with placement of Coflex;  Surgeon: [name], MD;  Location: [facility];  Service: Neurosurgery;  Laterality: N/A;    There were no vitals filed for this visit.  Visit Diagnosis:  No diagnosis found.   Treatment: Reviewed HEP for PWR! Seated basic !  cutting food with larger movements. Therapist checked progress towards goals.                           OT Short Term Goals - 10/19/15 1323    OT SHORT TERM GOAL #1   Title Pt will perform updated HEP with supervision/min cues.--check STGs 09/22/15   Status Achieved  at approx this level 10/13/15   OT SHORT TERM GOAL #2   Title Pt will write name with at least 75% legibility.   Status Achieved   OT SHORT TERM GOAL #3   Title Pt will report incr ease with washing hair.   Baseline inconsistent performance 09/28/15   Status Partially Met   OT SHORT TERM GOAL #4   Title Pt will pick up cup using big amplitude movements in 5/10 attempts.   Status Achieved   OT SHORT TERM GOAL #5   Title Pt will report incr ease with brushing teeth.   Baseline met with  electric toothbrush 09/28/15   Status Achieved           OT Long Term Goals - 11/03/2015 1326    OT LONG TERM GOAL #1   Title Pt/caregiver will verbalize understanding of adaptive strategies/AE for ADLs prn.--check LTGs 10/23/15   Time 8   Period Weeks   Status Achieved  10/13/15  caregiver verbalizes understanding, pt needs reinforcement   OT LONG TERM GOAL #2   Title Pt will be able to don shirt using big amplitude movement strategies.   Time 8   Period Weeks   Status Achieved  10/13/15:  per wife improved use of strategies with min cues   OT LONG TERM GOAL #3   Title Pt will be able to write name and simple phrase with at least 90% legibility.   Time 8   Period Weeks   Status Achieved  10/11/14  Met at approx this level   OT LONG TERM GOAL #4   Title Pt will pick up cup using big amplitude movements in 8/10 attempts.    Time 8   Period Weeks   Status Achieved  10/12/15   OT LONG TERM GOAL #5   Title Pt will be able to brush hair with min A.   Time 8   Period Weeks   Status Achieved  10/13/15 at this level (min cues)   OT LONG TERM GOAL #6   Title Pt will be able to cut food mod I using AE/stratgies prn.   Time 8   Period Weeks   Status Achieved   OT LONG TERM GOAL #7   Title Pt will improve coordination for ADLs as shown by improving time on 9-hole peg test by at [place] with dominant LUE.   Baseline 46.31sec   Time 8   Period Weeks   Status Partially Met  42.03 secs, 28.04 secs 11-03-15               Plan - 10/20/15 1634    Clinical Impression Statement Pt made good overall progress and he agrees with palns for d/c.   Pt will benefit from skilled therapeutic intervention in order to improve on the following deficits (Retired) Decreased cognition;Decreased mobility;Impaired perceived functional ability;Impaired UE functional use;Decreased knowledge of use of DME;Decreased balance;Decreased knowledge of precautions;Impaired tone;Decreased safety awareness;Decreased coordination   Rehab Potential Good   OT Frequency 2x / week   OT Duration 8 weeks   OT Treatment/Interventions Self-care/ADL training;Cryotherapy;Electrical Stimulation;Moist Heat;Passive range of motion;Cognitive remediation/compensation;Fluidtherapy;DME and/or AE instruction;Therapeutic activities;Therapeutic exercises;Energy conservation;Neuromuscular education;Ultrasound;Manual Therapy;Splinting;Patient/family education;Therapist, nutritional;Therapeutic exercise   Plan d/c OT   OT Home Exercise Plan Education issued:  HEP for functional activities with focus on timing/big amplitude movements   Consulted and Agree with Plan of Care Patient;Family member/caregiver   Family Member Consulted wife          G-Codes - 11-03-2015 1636    Functional Assessment Tool Used clinical impressions, 9 hole peg test RUE 29.32,  LUE 42.03, 28.04 secs   Functional Limitation Self care   Self Care Goal Status (F6213) At least 40 percent but less than 60 percent impaired, limited or restricted   Self Care Discharge Status [phone]) At least 40 percent but less than 60 percent impaired, limited or restricted      Problem List Patient Active Problem List   Diagnosis Date Noted  . Lumbar stenosis 06/06/2015  . HOCM (hypertrophic obstructive cardiomyopathy) ([place]) 12/12/2014  . Infection of right prepatellar bursa 11/02/2014  .  DOE (dyspnea on exertion) 09/06/2014  . Fatigue due to excessive exertion 09/06/2014  . Testosterone deficiency 11/12/2013  . Routine general medical examination at a health care facility 08/30/2013  . Mixed hyperlipidemia 08/30/2013  . Other abnormal glucose 08/30/2013  . 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 Parkinsonism ([place]) 01/16/2013  . INSOMNIA 06/06/2008  . GLAUCOMA 06/01/2008  . Essential hypertension 06/01/2008  . Seasonal and perennial allergic rhinitis 06/01/2008    RINE,KATHRYN 10/20/2015, 4:45 PM Theone Murdoch, OTR/L [phone] Phone: [phone] 4:45 PM 10/20/2015 [facility] [address] [place], [place], [address] Phone: [phone]   Fax:  [phone]  Name: Perrin Gens MRN: [id number] Date of Birth: 06-09-1946 OCCUPATIONAL THERAPY DISCHARGE SUMMARY   Current functional level related to goals / functional outcomes: Pt made good overall progress towards goals. Pt performs well when he slows down and focuses on task. Pt's wife is able to cue him.   Remaining deficits: Hypokinesia, decreased coordination, decreased strength, decreased balance, cognitive deficits,   Education / Equipment: Pt was educated regarding adapted  strategies for safety with ADLs, and  HEP . Pt wife agree to d/c.  Plan: Patient agrees to discharge.  Patient goals were not met. Patient is being discharged due to  meeting the stated rehab goals.  ?????

## 2015-10-20 NOTE — Therapy (Signed)
[facility] [address] [place], [place], [address] Phone: [phone]   Fax:  [phone]  Speech Language Pathology Treatment  Patient Details  Name: [name] MRN: [id number] Date of Birth: [birth year] No Data Recorded  Encounter Date: 10/19/2015      End of Session - 10/19/15 1715    Visit Number 13   Number of Visits 17   Date for SLP Re-Evaluation 10/27/15   SLP Start Time 1448   SLP Stop Time  1530   SLP Time Calculation (min) 42 min   Activity Tolerance Patient tolerated treatment well      Past Medical History  Diagnosis Date  . HTN (hypertension)   . Glaucoma   . Allergic rhinitis   . Parkinsonism ([place]) 01/16/2013  . Hyperlipidemia   . Thyroid disease     HYPOTHYROID  . Hypogonadism male   . Pre-diabetes   . Complication of anesthesia     Slow to awaken  . Dysphagia   . Hypothyroidism   . Constipation   . GERD (gastroesophageal reflux disease)     Decdreased, has decreased coffee intack.  . Arthritis   . DDD (degenerative disc disease), cervical   . Cancer ([place])     basal cell skin cancer  . OSA (obstructive sleep apnea)     Has had surgery x 2    Past Surgical History  Procedure Laterality Date  . Uvulopalatopharyngoplasty    . Lumbar disc surgery    . Cataract extraction    . I&d extremity Right 10/18/2014    Procedure: IRRIGATION AND DEBRIDEMENT RIGHT KNEE PREPATELLA BURSA INFECTION;  Surgeon: [name], MD;  Location: [facility];  Service: Orthopedics;  Laterality: Right;  . Tonsillectomy      and adenoids  . Nasal septum surgery    . Lumbar laminectomy with coflex 1 level N/A 06/06/2015    Procedure: Lumbar three-four Laminectomy/Foraminotomy with placement of Coflex;  Surgeon: [name], MD;  Location: [facility];  Service: Neurosurgery;  Laterality: N/A;    There were no vitals filed for this visit.  Visit Diagnosis: Oropharyngeal dysphagia  Dysarthria       Subjective Assessment - 10/19/15 1450    Subjective Pt's wife in the room with pt.   Patient is accompained by: Family member  wife               ADULT SLP TREATMENT - 10/19/15 1452    General Information   Behavior/Cognition Alert;Pleasant mood;Cooperative   Treatment Provided   Treatment provided Cognitive-Linquistic   Dysphagia Treatment   Temperature Spikes Noted No   Treatment Methods Therapeutic exercise;Patient/caregiver education;Skilled observation   Patient observed directly with PO's Yes   Type of PO's observed Thin liquids   Pharyngeal Phase Signs & Symptoms Wet vocal quality;Immediate cough   Other treatment/comments Spacing of "ga"s, and open mouth posture on vocal adduction req'd cueing. Educated/re-educated pt on meaning of hydrophonic voice and suggested cough/throat clear and reswallow, given pt's cont demonstration of this during therapy sessions. Pt coughed with two sips water today, with SLP reminding pt of small sips, rarely.   Pain Assessment   Pain Assessment No/denies pain   Cognitive-Linquistic Treatment   Treatment focused on Dysarthria   Skilled Treatment Pt read 10+ word sentences with metronome with 100% intelligibility. Metronome effect held for approx 2 sentnenes following SLP removal of metronome. With picture description and metronome pt kept 100% intelligibliity. When SLP stopped metronome, metronome effect remained  for two pictures and then pt reverted to speaking with mostly unintelligible rate.    Assessment / Recommendations / Plan   Plan Continue with current plan of care   Dysphagia Recommendations   Diet recommendations Dysphagia 3 (mechanical soft);Thin liquid   Compensations Slow rate;Small sips/bites;Clear throat intermittently  start meals with liquids   Progression Toward Goals   Progression toward goals --  max potential reached            SLP Short Term Goals - 09/27/15 1650    SLP SHORT TERM GOAL #1   Title pt will  perform HEP with occasional min A   Period Weeks   Status Achieved   SLP SHORT TERM GOAL #2   Title pt will demo swallow precautions with POs to remain safe during meals with occasional min A   Time 1   Period Weeks   Status Not Met          SLP Long Term Goals - 2015/11/12 1717    SLP LONG TERM GOAL #1   Title pt will perform HEP to improve oropharyneal swallowing ability with rare min A   Status Partially Met  occasional min A needed   SLP LONG TERM GOAL #2   Title pt will follow swallow precautions with rare mod A   Status Not Met   SLP LONG TERM GOAL #3   Title wife/caregiver will assist patient in completing HEP with modified independence (use of HEP form)   Status Deferred  pt does not want help from wife, despite SLP cues to  have her help   SLP LONG TERM GOAL #4   Title pt will demo 75% intelligiblity with spontaneous sentence responses   Status Achieved   SLP LONG TERM GOAL #5   Title pt will demo 85% intelligibility in simple conversation with occasional min A   Status Partially Met  with use of metronome          Plan - 12-Nov-2015 1715    Clinical Impression Statement Pt with almost same cues needed for reduced speech rate as well as with swallowing HEP as last week. Pt's speech rate improves with audible metronome but cannot be carried over >20 seconds. Pt has very likely reached max rehab potential at this time. Discharge appropriate at tihs time, pt/family agrees.   Treatment/Interventions Aspiration precaution training;Pharyngeal strengthening exercises;Diet toleration management by SLP;Compensatory techniques;Environmental controls   Potential to Achieve Goals Fair   Potential Considerations Previous level of function;Severity of impairments          G-Codes - 11-12-15 1719    Functional Assessment Tool Used MBSS report   Functional Limitations Swallowing   Swallow Goal Status (Z6606) At least 20 percent but less than 40 percent impaired, limited or  restricted   Swallow Discharge Status [phone]) At least 20 percent but less than 40 percent impaired, limited or restricted     [facility]  Visits from Start of Care: 13  Current functional level related to goals / functional outcomes: See "short term goals" and "long term goals" above for details. Pt made measurable progress with speech rate using an audible metronome, however no carryover was seen past 20 seconds after metronome was turned off. Pt reports he has been consistent with his HEP for dysphagia, however cont to perform 2-3 exercises incorrectly for SLP during therapy sessions, requiring occasional min A each time. Pt has refused help from his wife for cues how to complete HEP correctly.  Remaining deficits: Dysphagia, dysarthria, cognitive deficits   Education / Equipment: HEP, compensations for rate reduction in speech.  Plan: Patient agrees to discharge.  Patient goals were partially met. Patient is being discharged due to                                                     ?????maximizing his current rehab potential at this time.   Referring MD may want to refer pt for follow up modified barium swallow exam in mid-February.     Problem List Patient Active Problem List   Diagnosis Date Noted  . Lumbar stenosis 06/06/2015  . HOCM (hypertrophic obstructive cardiomyopathy) ([place]) 12/12/2014  . Infection of right prepatellar bursa 10/18/2014  . DOE (dyspnea on exertion) 09/06/2014  . Fatigue due to excessive exertion 09/06/2014  . Testosterone deficiency 11/12/2013  . Routine general medical examination at a health care facility 08/30/2013  . Mixed hyperlipidemia 08/30/2013  . Other abnormal glucose 08/30/2013  . 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 Parkinsonism ([place]) 01/16/2013  . INSOMNIA 06/06/2008  . GLAUCOMA 06/01/2008  . Essential hypertension 06/01/2008  . Seasonal and perennial allergic rhinitis 06/01/2008    [facility] , MS,  CCC-SLP  10/20/2015, 5:22 PM  [facility] [address] [place], [place], [address] Phone: [phone]   Fax:  [phone]   Name: Aristotle Lieb MRN: [id number] Date of Birth: Oct 29, 1945

## 2015-10-26 ENCOUNTER — Other Ambulatory Visit: Payer: Self-pay | Admitting: Internal Medicine

## 2015-10-28 ENCOUNTER — Encounter: Payer: Self-pay | Admitting: Physical Therapy

## 2015-10-28 NOTE — Therapy (Signed)
[facility] [address] [place], [place], [address] Phone: [phone]   Fax:  [phone]  Patient Details  Name: [name] MRN: [id number] Date of Birth: [birth year] Referring Provider:  No ref. provider found  Encounter Date: 10/28/2015  PHYSICAL THERAPY DISCHARGE SUMMARY  Visits from Start of Care: 11  Current functional level related to goals / functional outcomes:      PT Short Term Goals - 09/22/15 1005    PT SHORT TERM GOAL #1   Title Pt will perform HEP for Parkinson's-specific deficits with wife's supervision/assistance.  TARGET 09/22/15   Baseline per wife's report-sit<>stand transfers for HEP   Time 4   Period Weeks   Status Achieved   PT SHORT TERM GOAL #2   Title Pt will perform at least 6 of 10 reps of sit<>stand transfers from 18-20 inch surfaces with UE support with no evidence of posterior lean, for improved safety and efficiency of transfers.   Time 4   Period Weeks   Status Achieved   PT SHORT TERM GOAL #3   Title Pt will improve TUG score to less than or equal to 20 seconds for decreased fall risk.   Time 4   Period Weeks   Status Not Met   PT SHORT TERM GOAL #4   Title Pt/wife will verbalize understanding of techniques to decrease festinating/freezing of gait.   Baseline Wife verbalizes understanding and provides cues for pt.   Time 4   Period Weeks   Status Partially Met         PT Long Term Goals - 10/20/15 1518    PT LONG TERM GOAL #1   Title Pt/wife will verbalize understanding of fall prevention within the home.  TARGET 10/23/15   Time 8   Period Weeks   Status Achieved   PT LONG TERM GOAL #2   Title Pt will improve Berg Balance score to at least 28/56 for decreased fall risk.   Baseline 30/56 on 10/13/15   Time 8   Period Weeks   Status Achieved   PT LONG TERM GOAL #3   Title Pt will improve gait velocity to at least 2.62 ft/sec for improved gait efficiency and safety.    Baseline 2.624 ft/sec on 10/13/15   Time 8   Period Weeks   Status Achieved   PT LONG TERM GOAL #4   Title Pt will perform floor>stand transfers with minimal assistance and UE support for safe fall recovery.   Time 8   Period Weeks   Status Achieved   PT LONG TERM GOAL #5   Title Pt will verbalize plans for continued community fitness upon D/C from PT.   Time 8   Period Weeks   Status Achieved     Pt has demonstrated functional improvements during the course of therapy.  He continues to need supervision with any type of gait for safety.   Remaining deficits: Fall risk, decreased balance, decreased safety awareness   Education / Equipment: HEP, fall prevention, safety with gait, community fitness  Plan: Patient agrees to discharge.  Patient goals were met. Patient is being discharged due to meeting the stated rehab goals.  ?????      Trevante Tennell W. 10/28/2015, 8:37 AM  Frazier Butt., PT [facility] [address] [place], [place], [address] Phone: [phone]   Fax:  [phone]

## 2015-10-31 ENCOUNTER — Encounter: Payer: Self-pay | Admitting: Neurology

## 2015-10-31 ENCOUNTER — Ambulatory Visit (INDEPENDENT_AMBULATORY_CARE_PROVIDER_SITE_OTHER): Payer: Medicare Other | Admitting: Neurology

## 2015-10-31 VITALS — BP 130/72 | HR 60 | Resp 16 | Ht 70.0 in | Wt 202.0 lb

## 2015-10-31 DIAGNOSIS — R296 Repeated falls: Secondary | ICD-10-CM | POA: Diagnosis not present

## 2015-10-31 DIAGNOSIS — Z9181 History of falling: Secondary | ICD-10-CM

## 2015-10-31 DIAGNOSIS — G20 Parkinson's disease: Secondary | ICD-10-CM | POA: Diagnosis not present

## 2015-10-31 DIAGNOSIS — R269 Unspecified abnormalities of gait and mobility: Secondary | ICD-10-CM | POA: Diagnosis not present

## 2015-10-31 DIAGNOSIS — G20C Parkinsonism, unspecified: Secondary | ICD-10-CM

## 2015-10-31 DIAGNOSIS — F482 Pseudobulbar affect: Secondary | ICD-10-CM | POA: Diagnosis not present

## 2015-10-31 DIAGNOSIS — Z9889 Other specified postprocedural states: Secondary | ICD-10-CM

## 2015-10-31 DIAGNOSIS — R1310 Dysphagia, unspecified: Secondary | ICD-10-CM

## 2015-10-31 NOTE — Patient Instructions (Signed)
We will continue with supportive treatment. Use your walker at all times.  Do not multitask when standing, turning, walking or eating and drinking.

## 2015-10-31 NOTE — Progress Notes (Signed)
Subjective:    Schaefer ID: [name] is a 70 y.o. male.  HPI     Interim history:   [name] is a 70 year old left-handed gentleman with an underlying medical history of hyperlipidemia, insomnia, glaucoma, hypertension, allergic rhinitis and low testosterone, who presents for FU consultation of [name] parkinsonism, concern for PSP. He is accompanied by [name] again today. I last saw [name] on 04/28/2015, at which time he reported worsening balance. He had fallen. He had stopped [name] amitriptyline. He had seen Dr. Ellene Route and a pain specialist, Dr. Maryjean Ka, and had received SEI in 3/16 and 6/16, with Mason latter not effective. [name] reported that [name] voice was weaker and [name] memory was getting worse, per [name] report. He has had multiple . Hi falls. He was not always using [name] walker. He has had symptoms of pseudobulbar affect but had tried and failed Nuedexta in Mason past (had side effects). He had some coughing and choking with liquids sometimes.   Today, 10/31/2015: He reports doing better after back surgery. He had a swallow study on 07/13/2015 which showed aspiration with thin liquids. Diet recommendations was dysphagia 3, mechanical soft diet, liquid via cup and straw, medications crushed with pure or whole with pudding if small, reflux precautions. He had interim back surgery on 06/06/2015 secondary to lumbar spinal stenosis at L3-4. He underwent bilateral laminotomies and decompression L3-L4 and spacer placement. Postoperatively he had urinary retention with resolution with catheterization and starting Flomax. He has been in outpatient therapy and finished it. He had improvement in [name] back pain. Therapy has helped. He has a tendency to fall backwards with upper body turns or if gets distracted.    Previously:   I saw [name] on 11/18/2014, at which time he reported having finished a home health physical therapy and he was using a 2 wheeled walker. Prior to using [name] walker he had fallen numerous  times. He had thankfully not fallen since he was using [name] walker more consistently. In Mason interim he had re-evaluations with outpatient physical and occupational therapy on 01/04/2015 and I reviewed Mason reports. At Mason time outpatient OT and PT were not recommended and reevaluation for recommended after 6-9 months. He had been started on bystolic by Dr. [name]. He had a 2-D echocardiogram in December 2015, which was unremarkable. He also had a nuclear stress test in December 2015 which was unremarkable. He had seen Dr. Sharol Given in orthopedics. I asked [name] to continue to use [name] walker consistently and we discussed that he should no longer drive.  I saw [name] on 12/17/13, I talked about gait safety and [name] recurrent falls. I referred [name] to physical therapy. In Mason interim, he was seen by our nurse practitioner, Ms. Lam on 05/18/2014, at which time he was referred to physical therapy for gait evaluation and Mason use of a walker as he was reported recurrent falls.   ` In Mason interim, he was admitted to [facility] hospital on 10/18/2014 and discharged on 10/22/2014, secondary to prepatellar septic bursitis of Mason right knee. He underwent excisional irrigation and debridement of Mason prepatellar bursa. He had fallen multiple times, inside and outside. He was receiving home health therapy after that. He was using a rolling walker. I reviewed Mason hospital records.  I saw [name] on 08/03/2013, at which time I felt that [name] history and physical exam were concerning for PSP. He had been on dopaminergic medications in Mason past but had side effects. A recent trial of Sinemet did  not help. I considered a repeat swallow study. He was questioning whether he could have NPH. [name] last head CT scans from May 2013 as well as April 2014 did not indicate any problems in that regard and I explained that to them last time. I felt he had worsened in [name] gait and balance and fine motor skills. I referred [name] for physical therapy because of worsening  gait imbalance and assessment of [name] walking especially with respect to assistive devices. I suggested no new medications. In Mason interim, he has stopped Elavil and has been started on trazodone by [name] primary care physician. He reported falling more and he fell down Mason stairs in Mason house and [name] reports that he did bruise [name] arms and did not hit [name] head. He did not hold onto Mason rail. He indicates that he will not use a walker as that is "for old people". He fell outside in Mason yard. He still have labile emotional responses. Some 2 weeks ago, he coughed while eating and may have choked on peanuts. He was watching TV at Mason time and may have been distracted. He saw [name] PCP. He did not have a CXR and was suspected to have reflux and was started on Prilosec. He was changed to Trazodone, but had insomnia and had vivid dreams. He tried it for 2 nights and stopped, went back to Elavil 25 mg.   I saw [name] on 01/16/2013 at which time we talked about parkinsonism, in particular PSP. I ordered a head CT as he was wondering if he had NPH. I did not think [name] clinical presentation was consistent with NPH. He had a head CT on 01/21/2013 without contrast which was reported as normal. We called [name] with Mason test results. He sees Dr. Baird Lyons for [name] allergic rhinitis. He had no success with dopaminergic medications and I also tried [name] on low dose Sinemet without success. He had an epidural injection this month and was numb from Mason waist down for 3 hours and fell, when he tried to walk. He did not hurt himself. But Mason injection help [name] back pain. [name] walking is worse per Schaefer. He has had some near falls backwards. He does not use a walking aid and indicates he will not use a walker. [name] judgement is impaired, she states. A few weeks ago, he climbed up Mason playhouse they have in Mason backyard for their grandchildren and they had to call Mason fire department. She was not there at Mason time.   He was told to stop Mason  Bystolic and Mason Zocor. He had blood work from 06/25/13 through [name] PCP and [name] total cholesterol was 173, LDL was 96 and Hb was 17.4.   He has an at least 6-1/2 year Hx of progresssive gait difficulties, balance problems, speech impairment. I first met [name] on 10/24/12, at which time I suggested no medication changes. He was on amitriptyline, which had been started by Dr. Brett Fairy in November last year. He had reported, that [name] gait was a little better since Mason amitriptyline, but he called in Mason interim in February and requested another medication. I suggested a trial of low dose of Sinemet 1/2 pill tid, but he called back d/t sedation and eventually stopped it and re-started Elavil. He has benefitted from therapy.   He has had PT, OT and ST and noted improvement. He tried Nuedexta for [name] pseudobulbar affect in Mason past, however he was not able to tolerate d/t sleepiness.  Similarly, he had sedation on carbidopa-levodopa as well as mirapex low-dose. He has fallen in Mason past. He has had some problems swallowing particularly when eating too fast and had a MBBS in January 2014. He has had problems with bladder control sometimes. He says he does not always make it to Mason bathroom on time. There are no significant issues with bladder retention. He has not had any fainting spells. He has had some mild forgetfulness but nothing progressive or very concerning.  [name] Past Medical History Is Significant For: Past Medical History  Diagnosis Date  . HTN (hypertension)   . Glaucoma   . Allergic rhinitis   . Parkinsonism ([place]) 01/16/2013  . Hyperlipidemia   . Thyroid disease     HYPOTHYROID  . Hypogonadism male   . Pre-diabetes   . Complication of anesthesia     Slow to awaken  . Dysphagia   . Hypothyroidism   . Constipation   . GERD (gastroesophageal reflux disease)     Decdreased, has decreased coffee intack.  . Arthritis   . DDD (degenerative disc disease), cervical   . Cancer ([place])     basal cell  skin cancer  . OSA (obstructive sleep apnea)     Has had surgery x 2    [name] Past Surgical History Is Significant For: Past Surgical History  Procedure Laterality Date  . Uvulopalatopharyngoplasty    . Lumbar disc surgery    . Cataract extraction    . I&d extremity Right 10/18/2014    Procedure: IRRIGATION AND DEBRIDEMENT RIGHT KNEE PREPATELLA BURSA INFECTION;  Surgeon: [name], MD;  Location: [facility];  Service: Orthopedics;  Laterality: Right;  . Tonsillectomy      and adenoids  . Nasal septum surgery    . Lumbar laminectomy with coflex 1 level N/A 06/06/2015    Procedure: Lumbar three-four Laminectomy/Foraminotomy with placement of Coflex;  Surgeon: [name], MD;  Location: [facility];  Service: Neurosurgery;  Laterality: N/A;    [name] Family History Is Significant For: Family History  Problem Relation Age of Onset  . Emphysema Mother   . COPD Mother   . Heart disease Mother   . Liver cancer Father   . Cancer Father   . Diabetes Father   . Heart disease Sister   . COPD Sister   . Heart attack Sister   . Hypertension Mother   . Hypertension Father   . Hypertension Sister   . Stroke Neg Hx     [name] Social History Is Significant For: Social History   Social History  . Marital Status: Married    Spouse Name: [name]  . Number of Children: 2  . Years of Education: college   Occupational History  . branch Librarian, academic   .     Social History Main Topics  . Smoking status: Never Smoker   . Smokeless tobacco: Never Used  . Alcohol Use: No  . Drug Use: No  . Sexual Activity: Not Asked   Other Topics Concern  . None   Social History Narrative    [name] Allergies Are:  Allergies  Allergen Reactions  . Ambien [Zolpidem Tartrate]     Unknown- balance  . Atenolol     unknown  . Lunesta [Eszopiclone]     unknown  . Oxazepam     unknown  . Requip [Ropinirole Hcl]     unknown  . Ziac [Bisoprolol-Hydrochlorothiazide]     unknown  :   [name] Current  Medications Are:  Outpatient Encounter Prescriptions as of 10/31/2015  Medication Sig  . aspirin 81 MG tablet Take 81 mg by mouth daily.  Marland Kitchen BYSTOLIC 2.5 MG tablet TAKE 1 TABLET BY MOUTH EVERY DAY  . Cholecalciferol (VITAMIN D3) 10000 UNITS capsule Take 5,000 Units by mouth daily. Mon. ,[id number] weekly  . fluticasone (FLONASE) 50 MCG/ACT nasal spray USE 1-2 SPRAYS IN EACH NOSTRIL EVERY DAY  . levothyroxine (SYNTHROID, LEVOTHROID) 50 MCG tablet TAKE 1 TABLET BY MOUTH EVERY DAY  . loratadine (CLARITIN) 10 MG tablet Take 10 mg by mouth daily.  Marland Kitchen losartan (COZAAR) 100 MG tablet TAKE 1 TABLET BY MOUTH DAILY  . magnesium gluconate (MAGONATE) 500 MG tablet Take 500 mg by mouth as needed.  . Multiple Vitamins-Minerals (MULTIVITAMIN WITH MINERALS) tablet Take 1 tablet by mouth daily.    . mupirocin ointment (BACTROBAN) 2 % APPLY TO Mason AFFECTED AREA BID  . Naproxen Sodium (ALEVE PO) Take 2 tablets by mouth daily as needed. For pain  . Omega-3 Fatty Acids (FISH OIL) 1000 MG CAPS Take 1,200 mg by mouth daily. Pt takes 2400 mg on Tues, Thurs and Sat.  . polyethylene glycol (MIRALAX / GLYCOLAX) packet Take 17 g by mouth daily as needed.  . testosterone cypionate (DEPO-TESTOSTERONE) 200 MG/ML injection Inject 2 mLs (400 mg total) into Mason muscle every 21 ( twenty-one) days.  Marland Kitchen timolol (TIMOPTIC) 0.5 % ophthalmic solution Place 1 drop into both eyes 2 (two) times daily.    No facility-administered encounter medications on file as of 10/31/2015.  :  Review of Systems:  Out of a complete 14 point review of systems, all are reviewed and negative with Mason exception of these symptoms as listed below:   Review of Systems  Neurological:       Schaefer is here for f/u. Schaefer feels like he is having more slurred speak, unsteadiness, and falls.     Objective:  Neurologic Exam  Physical Exam Physical Examination:   Filed Vitals:   10/31/15 1300  BP: 130/72  Pulse: 60  Resp: 16   General  Examination: [name] is a 70 y.o. male in no acute distress.   HEENT exam reveals significant nuchal rigidity with decrease in passive range of motion. He has significant masked facies. He has a significant decrease eye blink rate. Funduscopy is unremarkable. He is status post cataract surgeries b/l. Extraocular tracking testing shows some saccadic breakdown and limitation to up and downgaze. No nystagmus is noted. Pupils are equal, round and reactive to light. Oropharynx is clear and mouth is mildly dry. [name] speech is moderately dysarthric and moderate to severely hypophonic today. [name] hearing is grossly intact.  Chest auscultation reveals coarse breath sounds, no wheeze or crackle.   Heart sounds are normal and abdominal sounds are normal as well.   Abdomen is soft nontender.   Musculoskeletal: He has no edema in Mason distal lower extremities and wears compression stockings up to knees.   Neurologically: Mental status: [name] is awake, alert and oriented in all 3 spheres. [name] memory, attention, language and knowledge are fairly appropriate and accurate. He has moderate bradyphrenia.   On 11/18/2014: MMSE 30/30, CDT 3/4, AFT 19/min.  On 10/31/2015: MMSE: 30/30, CDT: 3/4, AFT: 21/min.   Cranial nerves are as described under HEENT exam. Motor exam: He has normal bulk and increased tone in all 4 extremities, right more than left. There is some cogwheeling in Mason upper extremities bilaterally. He has no resting tremor but does have  a mild postural tremor in both upper extremities. On fine motor testing he has moderate to severe impairment of finger taps bilaterally. Hand movements are moderately impaired. Foot taps are severely impaired on Mason left and right. Foot agility is moderately impaired on both sides. He is able to stand up from Mason seated position without and pushes himself up. [name] posture is mild to moderately stooped. He walks fairly well with a walker. He has no limp on Mason right. He  turns in 4 steps. He has some insecurity with turning. Balance is impaired. Sensory exam is intact to all modalities in Mason upper and lower extremities.   Assessment and Plan:   In summary, [name] is a 70 year old left-handed gentleman with a approximately 7-8 year history of progressive gait disorder, speech impairment, memory loss (mild), balance problems with falls and evidence of pseudobulbar affect. [name] history and physical examination are concerning for parkinsonism and most consistent with PSP or progressive supranuclear palsy. He has been through outpatient speech therapy, physical therapy and occupational therapy with several rounds of therapy. He was tried on Nuedexta for PBA in Mason past but had side effects. He did not do well with PD medications and a trial of amitriptyline.  A confounding problem has been [name] low back pain. An MRI L spine had shown degenerative findings. He had ESI with modest improvement. Had low back surgery in August 2016 and has done well with pa regards to low back pain improvement as well as posture is better. He has been using [name] 2 wheeled walker more consistently. He has a tendency to fall when he is distracted or turns suddenly or tries to multitask while standing. I encouraged [name] to continue to use [name] walker at all times. He has reevaluation with physical, occupational and speech therapy pending for April 2017. I will see [name] back in about 6 months, sooner if Mason need arises. Dr. Ellene Route has released [name] for about 18 months as he has done well.  I encouraged them to call with any interim questions, concerns, problems, or updates.  I answered all her questions today and they were in agreement.  I spent 25 minutes in total face-to-face time with [name], more than 50% of which was spent in counseling and coordination of care, reviewing test results, reviewing medication and discussing or reviewing Mason diagnosis of parkinsonism and fall risk, Mason prognosis and  treatment options.

## 2015-11-07 ENCOUNTER — Telehealth: Payer: Self-pay | Admitting: Internal Medicine

## 2015-11-07 NOTE — Telephone Encounter (Signed)
Please see message and advise 

## 2015-11-07 NOTE — Telephone Encounter (Signed)
Spoke to pt's [name], told her pt called about new medication he wanted to try for constipation Amitiza. Dr.K said that he can order it but it may be expensive and not covered by insurance. [name] said let me check with insurance first and get back to you. Told her okay that is fine just let me know. [name] verbalized understanding.

## 2015-11-07 NOTE — Telephone Encounter (Signed)
Pt saw rx amitiza on the tv and would like to try this med for constipation.   [facility]/ [address]

## 2015-11-07 NOTE — Telephone Encounter (Signed)
Amitiza 24 mcg  #60  One BID; notify patient that this medication may be expensive and may not be covered

## 2015-11-08 MED ORDER — LUBIPROSTONE 24 MCG PO CAPS: 24.0000 ug | ORAL_CAPSULE | Freq: Two times a day (BID) | ORAL | Status: DC

## 2015-11-08 NOTE — Telephone Encounter (Signed)
New Rx for Amitiza sent to pharmacy.

## 2015-11-08 NOTE — Telephone Encounter (Signed)
Pt is calling and would like med to be call into  [facility] [address]

## 2015-11-27 ENCOUNTER — Other Ambulatory Visit: Payer: Self-pay | Admitting: Internal Medicine

## 2015-12-12 ENCOUNTER — Other Ambulatory Visit: Payer: Self-pay | Admitting: Internal Medicine

## 2015-12-15 ENCOUNTER — Telehealth: Payer: Self-pay | Admitting: Neurology

## 2015-12-15 DIAGNOSIS — G20 Parkinson's disease: Secondary | ICD-10-CM

## 2015-12-15 NOTE — Telephone Encounter (Signed)
Per neuro rehab team, pt was recently reevaluated with PT, OT and ST and therapies are recommended. I placed an order.

## 2015-12-19 NOTE — Telephone Encounter (Signed)
Noted this has been sent to Neuro rehab.

## 2015-12-20 DIAGNOSIS — R3912 Poor urinary stream: Secondary | ICD-10-CM | POA: Diagnosis not present

## 2015-12-20 DIAGNOSIS — N3946 Mixed incontinence: Secondary | ICD-10-CM | POA: Diagnosis not present

## 2015-12-20 DIAGNOSIS — N5201 Erectile dysfunction due to arterial insufficiency: Secondary | ICD-10-CM | POA: Diagnosis not present

## 2015-12-26 DIAGNOSIS — Z961 Presence of intraocular lens: Secondary | ICD-10-CM | POA: Diagnosis not present

## 2015-12-26 DIAGNOSIS — H401112 Primary open-angle glaucoma, right eye, moderate stage: Secondary | ICD-10-CM | POA: Diagnosis not present

## 2015-12-26 DIAGNOSIS — G20 Parkinson's disease: Secondary | ICD-10-CM | POA: Diagnosis not present

## 2015-12-26 DIAGNOSIS — H401123 Primary open-angle glaucoma, left eye, severe stage: Secondary | ICD-10-CM | POA: Diagnosis not present

## 2016-01-02 ENCOUNTER — Ambulatory Visit (AMBULATORY_SURGERY_CENTER): Payer: Self-pay | Admitting: *Deleted

## 2016-01-02 VITALS — Ht 70.0 in | Wt 202.8 lb

## 2016-01-02 DIAGNOSIS — Z1211 Encounter for screening for malignant neoplasm of colon: Secondary | ICD-10-CM

## 2016-01-02 NOTE — Progress Notes (Signed)
No egg or soy allergy known to patient  No issues with past sedation with any surgeries  or procedures, no intubation problems  No diet pills per patient No home 02 use per patient  No blood thinners per patient  Pt states  issues with constipation --pt states his stool is hard even with miralax 2 -3 times a week. Will do a 2 day colon prep due to this  Pt has parkinsonism- he has balance, speech and swallowing issues - he is a fall risk. He will need to sit to undress, his daughter will we here to help as well.

## 2016-01-13 ENCOUNTER — Ambulatory Visit (INDEPENDENT_AMBULATORY_CARE_PROVIDER_SITE_OTHER): Payer: Medicare Other | Admitting: Cardiology

## 2016-01-13 ENCOUNTER — Encounter: Payer: Self-pay | Admitting: Cardiology

## 2016-01-13 VITALS — BP 110/70 | HR 48 | Ht 70.0 in | Wt 202.0 lb

## 2016-01-13 DIAGNOSIS — R609 Edema, unspecified: Secondary | ICD-10-CM

## 2016-01-13 DIAGNOSIS — I421 Obstructive hypertrophic cardiomyopathy: Secondary | ICD-10-CM | POA: Diagnosis not present

## 2016-01-13 DIAGNOSIS — R600 Localized edema: Secondary | ICD-10-CM | POA: Insufficient documentation

## 2016-01-13 DIAGNOSIS — I10 Essential (primary) hypertension: Secondary | ICD-10-CM | POA: Diagnosis not present

## 2016-01-13 NOTE — Patient Instructions (Signed)
Medication Instructions:  None  Labwork: None  Testing/Procedures: None  Follow-Up: Your physician wants you to follow-up in: 1 year with Dr. Turner. You will receive a reminder letter in the mail two months in advance. If you don't receive a letter, please call our office to schedule the follow-up appointment.   Any Other Special Instructions Will Be Listed Below (If Applicable).    If you need a refill on your cardiac medications before your next appointment, please call your pharmacy.   

## 2016-01-13 NOTE — Progress Notes (Signed)
Cardiology Office Note    Date:  01/13/2016   ID:  [name], DOB [birth year], MRN [id number]  PCP:  Nyoka Cowden, MD  Cardiologist:  Sueanne Margarita, MD   Chief Complaint  Patient presents with  . Cardiomyopathy  . Hypertension    History of Present Illness:  [name] is a 70 y.o. male with a history of HTN, OSA, Parkinson's, hyperlipidemia, pre- diabetes, asymmetric septal hypertrophy c/w HOCM with no outflow tact gradientand hypothyroidism who presents today for followup of HTN, fatigue and SOB. He now presents for followup. He denies any chest pain. He does not have SOB when walking in the house but the SOB when bending over has not changed. He is 1 week out from spinal stenosis surgery. He denies any palpitations, dizziness or syncope. He intermittently has some mild LE edema after being up for a while and improves with compression hose.    Past Medical History  Diagnosis Date  . HTN (hypertension)   . Glaucoma   . Allergic rhinitis   . Parkinsonism ([place]) 01/16/2013    no tremors, balance issues only , speech and swallowing difficulties   . Hyperlipidemia   . Thyroid disease     HYPOTHYROID  . Hypogonadism male   . Pre-diabetes   . Complication of anesthesia     Slow to awaken  . Dysphagia   . Hypothyroidism   . Constipation   . GERD (gastroesophageal reflux disease)     Decdreased, has decreased coffee intack.  . Arthritis   . DDD (degenerative disc disease), cervical   . Cancer ([place])     basal cell skin cancer  . OSA (obstructive sleep apnea)     Has had surgery x 2  . Allergy   . At risk for falls   . Sleep apnea     surgery x 2- no cpap  . Cataract   . Staph infection 10-2014  . Edema extremities 01/13/2016    Past Surgical History  Procedure Laterality Date  . Uvulopalatopharyngoplasty    . Lumbar disc surgery  04-2015  . Cataract extraction    . I&d extremity Right 10/18/2014    Procedure: IRRIGATION AND DEBRIDEMENT RIGHT KNEE  PREPATELLA BURSA INFECTION;  Surgeon: [name], MD;  Location: [facility];  Service: Orthopedics;  Laterality: Right;  . Tonsillectomy      and adenoids  . Nasal septum surgery    . Lumbar laminectomy with coflex 1 level N/A 06/06/2015    Procedure: Lumbar three-four Laminectomy/Foraminotomy with placement of Coflex;  Surgeon: [name], MD;  Location: [facility];  Service: Neurosurgery;  Laterality: N/A;  . Colonoscopy  04-27-2004    tics, hems     Current Medications: Outpatient Prescriptions Prior to Visit  Medication Sig Dispense Refill  . aspirin 81 MG tablet Take 81 mg by mouth daily.    Marland Kitchen BYSTOLIC 2.5 MG tablet TAKE 1 TABLET BY MOUTH EVERY DAY 30 tablet 9  . Cholecalciferol (VITAMIN D3) 10000 UNITS capsule Take 5,000 Units by mouth daily. Mon. ,[id number] weekly    . fluticasone (FLONASE) 50 MCG/ACT nasal spray USE 1-2 SPRAYS IN EACH NOSTRIL EVERY DAY 16 g 6  . levothyroxine (SYNTHROID, LEVOTHROID) 50 MCG tablet TAKE 1 TABLET BY MOUTH EVERY DAY 30 tablet 5  . loratadine (CLARITIN) 10 MG tablet Take 10 mg by mouth daily.    Marland Kitchen losartan (COZAAR) 100 MG tablet TAKE 1 TABLET BY MOUTH DAILY 30 tablet 5  . magnesium gluconate (MAGONATE)  500 MG tablet Take 500 mg by mouth as needed.    . Multiple Vitamins-Minerals (MULTIVITAMIN WITH MINERALS) tablet Take 1 tablet by mouth daily.      . mupirocin ointment (BACTROBAN) 2 % Reported on 01/02/2016  3  . Omega-3 Fatty Acids (FISH OIL) 1000 MG CAPS Take 1,200 mg by mouth daily. Pt takes 2400 mg on Tues, Thurs and Sat.    . polyethylene glycol (MIRALAX / GLYCOLAX) packet Take 17 g by mouth daily as needed.    . testosterone cypionate (DEPO-TESTOSTERONE) 200 MG/ML injection Inject 2 mLs (400 mg total) into the muscle every 21 ( twenty-one) days. 10 mL 5  . timolol (TIMOPTIC) 0.5 % ophthalmic solution Place 1 drop into both eyes 2 (two) times daily.   12  . polyethylene glycol powder (GLYCOLAX/MIRALAX) powder MIX 17 G, WITH A LIQUID, TWICE DAILY  AS NEEDED 1054 g 1  . finasteride (PROSCAR) 5 MG tablet Take 5 mg by mouth daily. Reported on 01/13/2016    . lubiprostone (AMITIZA) 24 MCG capsule Take 1 capsule (24 mcg total) by mouth 2 (two) times daily with a meal. (Patient not taking: Reported on 01/02/2016) 60 capsule 5  . Naproxen Sodium (ALEVE PO) Take 2 tablets by mouth daily as needed. For pain     No facility-administered medications prior to visit.     Allergies:   Ambien; Atenolol; Lunesta; Oxazepam; Requip; and Ziac   Social History   Social History  . Marital Status: Married    Spouse Name: linda  . Number of Children: 2  . Years of Education: college   Occupational History  . branch Librarian, academic   .     Social History Main Topics  . Smoking status: Never Smoker   . Smokeless tobacco: Never Used  . Alcohol Use: No  . Drug Use: No  . Sexual Activity: Not Asked   Other Topics Concern  . None   Social History Narrative     Family History:  The patient's family history includes COPD in his mother and sister; Cancer in his father; Colon polyps in his brother and father; Diabetes in his father; Emphysema in his mother; Heart attack in his sister; Heart disease in his mother and sister; Hypertension in his father, mother, and sister; Liver cancer in his father. There is no history of Stroke or Colon cancer.   ROS:   Please see the history of present illness.    ROS All other systems reviewed and are negative.   PHYSICAL EXAM:   VS:  BP 110/70 mmHg  Pulse 48  Ht 5\' 10"  (1.778 m)  Wt 202 lb (91.627 kg)  BMI 28.98 kg/m2   GEN: Well nourished, well developed, in no acute distress HEENT: normal Neck: no JVD, carotid bruits, or masses Cardiac: RRR; no murmurs, rubs, or gallops,no edema.  Intact distal pulses bilaterally.  Respiratory:  clear to auscultation bilaterally, normal work of breathing GI: soft, nontender, nondistended, + BS MS: no deformity or atrophy Skin: warm and dry, no rash Neuro:  Alert and  Oriented x 3, Strength and sensation are intact Psych: euthymic mood, full affect  Wt Readings from Last 3 Encounters:  01/13/16 202 lb (91.627 kg)  01/02/16 202 lb 12.8 oz (91.989 kg)  10/31/15 202 lb (91.627 kg)      Studies/Labs Reviewed:   EKG:  EKG is ordered today.  The ekg ordered today demonstrates sinus bradycardia at 48bpm with no ST changes  Recent Labs: 09/30/2015: ALT 31;  BUN 12; Creatinine, Ser 0.84; Hemoglobin 17.9*; Platelets 224.0; Potassium 4.8; Sodium 138; TSH 2.35   Lipid Panel    Component Value Date/Time   CHOL 185 09/30/2015 1117   TRIG 191.0* 09/30/2015 1117   HDL 38.90* 09/30/2015 1117   CHOLHDL 5 09/30/2015 1117   VLDL 38.2 09/30/2015 1117   LDLCALC 108* 09/30/2015 1117    Additional studies/ records that were reviewed today include:  None    ASSESSMENT:    1. HOCM (hypertrophic obstructive cardiomyopathy) ([place])   2. Essential hypertension   3. Edema extremities      PLAN:  In order of problems listed above:  1. HOCM - he is doing well with no chest pain or SOB. No BB or CCB due to bradycardia.  Continue ARB.   2. HTN - well controlled.  Continue ARB 3. LE edema - this is controlled with compression hose.   Followup with me in 1 year   Medication Adjustments/Labs and Tests Ordered: Current medicines are reviewed at length with the patient today.  Concerns regarding medicines are outlined above.  Medication changes, Labs and Tests ordered today are listed in the Patient Instructions below. There are no Patient Instructions on file for this visit.   Lurena Nida, MD  01/13/2016 2:00 PM    [address], [place], [place]  [address] Phone: [phone]; Fax: [phone]

## 2016-01-16 ENCOUNTER — Encounter: Payer: Self-pay | Admitting: Internal Medicine

## 2016-01-16 ENCOUNTER — Ambulatory Visit (AMBULATORY_SURGERY_CENTER): Payer: Medicare Other | Admitting: Internal Medicine

## 2016-01-16 VITALS — BP 123/69 | HR 54 | Temp 97.8°F | Resp 13 | Ht 70.0 in | Wt 202.0 lb

## 2016-01-16 DIAGNOSIS — I10 Essential (primary) hypertension: Secondary | ICD-10-CM | POA: Diagnosis not present

## 2016-01-16 DIAGNOSIS — G4733 Obstructive sleep apnea (adult) (pediatric): Secondary | ICD-10-CM | POA: Diagnosis not present

## 2016-01-16 DIAGNOSIS — Z1211 Encounter for screening for malignant neoplasm of colon: Secondary | ICD-10-CM

## 2016-01-16 DIAGNOSIS — D122 Benign neoplasm of ascending colon: Secondary | ICD-10-CM | POA: Diagnosis not present

## 2016-01-16 DIAGNOSIS — D124 Benign neoplasm of descending colon: Secondary | ICD-10-CM | POA: Diagnosis not present

## 2016-01-16 DIAGNOSIS — D120 Benign neoplasm of cecum: Secondary | ICD-10-CM | POA: Diagnosis not present

## 2016-01-16 DIAGNOSIS — G20 Parkinson's disease: Secondary | ICD-10-CM | POA: Diagnosis not present

## 2016-01-16 DIAGNOSIS — E669 Obesity, unspecified: Secondary | ICD-10-CM | POA: Diagnosis not present

## 2016-01-16 MED ORDER — SODIUM CHLORIDE 0.9 % IV SOLN: 500.0000 mL | INTRAVENOUS | Status: DC

## 2016-01-16 NOTE — Op Note (Signed)
[name] Patient Name: [place] Procedure Date: 01/16/2016 2:04 PM MRN: [id number] Endoscopist: Gatha Mayer , MD Age: 70 Date of Birth: 10-01-1946 Gender: Male Procedure:                Colonoscopy Indications:              Screening for colorectal malignant neoplasm Medicines:                Propofol per Anesthesia, Monitored Anesthesia Care Procedure:                Pre-Anesthesia Assessment:                           - Prior to the procedure, a History and Physical                            was performed, and patient medications and                            allergies were reviewed. The patient's tolerance of                            previous anesthesia was also reviewed. The risks                            and benefits of the procedure and the sedation                            options and risks were discussed with the patient.                            All questions were answered, and informed consent                            was obtained. Prior Anticoagulants: The patient has                            taken no previous anticoagulant or antiplatelet                            agents. ASA Grade Assessment: II - A patient with                            mild systemic disease. After reviewing the risks                            and benefits, the patient was deemed in                            satisfactory condition to undergo the procedure.                           After obtaining informed consent, the colonoscope  was passed under direct vision. Throughout the                            procedure, the patient's blood pressure, pulse, and                            oxygen saturations were monitored continuously. The                            Model CF-HQ190L [id number]) scope was introduced                            through the anus and advanced to the the cecum,                            identified by appendiceal orifice  and ileocecal                            valve. The colonoscopy was performed without                            difficulty. The patient tolerated the procedure                            well. The quality of the bowel preparation was                            good. The bowel preparation used was Miralax. The                            ileocecal valve, appendiceal orifice, and rectum                            were photographed. Scope In: 2:18:26 PM Scope Out: 2:32:53 PM Scope Withdrawal Time: 0 hours 12 minutes 6 seconds  Total Procedure Duration: 0 hours 14 minutes 27 seconds  Findings:                 The perianal and digital rectal examinations were                            normal. Pertinent negatives include normal prostate                            (size, shape, and consistency).                           Three sessile polyps were found in the ascending                            colon and cecum. The polyps were 1 to 2 mm in size.                            These polyps were removed with a cold biopsy  forceps. Resection and retrieval were complete.                            Verification of patient identification for the                            specimen was done. Estimated blood loss was minimal.                           A 4 mm polyp was found in the descending colon. The                            polyp was sessile. The polyp was removed with a                            cold snare. Resection and retrieval were complete.                            Verification of patient identification for the                            specimen was done. Estimated blood loss was minimal.                           A few diverticula were found in the sigmoid colon.                            There was no evidence of diverticular bleeding.                           The exam was otherwise without abnormality on                            direct and retroflexion  views. Complications:            No immediate complications. Estimated Blood Loss:     Estimated blood loss was minimal. Impression:               - Three 1 to 2 mm polyps in the ascending colon and                            in the cecum, removed with a cold biopsy forceps.                            Resected and retrieved.                           - One 4 mm polyp in the descending colon, removed                            with a cold snare. Resected and retrieved.                           -  Mild diverticulosis in the sigmoid colon. There                            was no evidence of diverticular bleeding.                           - The examination was otherwise normal on direct                            and retroflexion views. Recommendation:           - Patient has a contact number available for                            emergencies. The signs and symptoms of potential                            delayed complications were discussed with the                            patient. Return to normal activities tomorrow.                            Written discharge instructions were provided to the                            patient.                           - Resume previous diet.                           - Continue present medications.                           - Repeat colonoscopy is recommended. The                            colonoscopy date will be determined after pathology                            results from today's exam become available for                            review. Gatha Mayer, MD 01/16/2016 2:40:35 PM This report has been signed electronically.

## 2016-01-16 NOTE — Patient Instructions (Addendum)
I found and removed 4 small polyps - all look benign. I will let you know pathology results and when to have another routine colonoscopy by mail.  You also have a condition called diverticulosis - common and not usually a problem. Please read the handout provided.  I appreciate the opportunity to care for you. Gatha Mayer, MD, FACG  YOU HAD AN ENDOSCOPIC PROCEDURE TODAY AT [facility]:   Refer to the procedure report that was given to you for any specific questions about what was found during the examination.  If the procedure report does not answer your questions, please call your gastroenterologist to clarify.  If you requested that your care partner not be given the details of your procedure findings, then the procedure report has been included in a sealed envelope for you to review at your convenience later.  YOU SHOULD EXPECT: Some feelings of bloating in the abdomen. Passage of more gas than usual.  Walking can help get rid of the air that was put into your GI tract during the procedure and reduce the bloating. If you had a lower endoscopy (such as a colonoscopy or flexible sigmoidoscopy) you may notice spotting of blood in your stool or on the toilet paper. If you underwent a bowel prep for your procedure, you may not have a normal bowel movement for a few days.  Please Note:  You might notice some irritation and congestion in your nose or some drainage.  This is from the oxygen used during your procedure.  There is no need for concern and it should clear up in a day or so.  SYMPTOMS TO REPORT IMMEDIATELY:   Following lower endoscopy (colonoscopy or flexible sigmoidoscopy):  Excessive amounts of blood in the stool  Significant tenderness or worsening of abdominal pains  Swelling of the abdomen that is new, acute  Fever of 100F or higher   For urgent or emergent issues, a gastroenterologist can be reached at any hour by calling [phone].   DIET:  Your first meal following the procedure should be a small meal and then it is ok to progress to your normal diet. Heavy or fried foods are harder to digest and may make you feel nauseous or bloated.  Likewise, meals heavy in dairy and vegetables can increase bloating.  Drink plenty of fluids but you should avoid alcoholic beverages for 24 hours.  ACTIVITY:  You should plan to take it easy for the rest of today and you should NOT DRIVE or use heavy machinery until tomorrow (because of the sedation medicines used during the test).    FOLLOW UP: Our staff will call the number listed on your records the next business day following your procedure to check on you and address any questions or concerns that you may have regarding the information given to you following your procedure. If we do not reach you, we will leave a message.  However, if you are feeling well and you are not experiencing any problems, there is no need to return our call.  We will assume that you have returned to your regular daily activities without incident.  If any biopsies were taken you will be contacted by phone or by letter within the next 1-3 weeks.  Please call us at [phone] if you have not heard about the biopsies in 3 weeks.    SIGNATURES/CONFIDENTIALITY: You and/or your care partner have signed paperwork which will be entered into your electronic medical record.  These signatures attest to the fact that that the information above on your After Visit Summary has been reviewed and is understood.  Full responsibility of the confidentiality of this discharge information lies with you and/or your care-partner.  Thank-you for choosing [facility] for your healthcare needs today.

## 2016-01-16 NOTE — Progress Notes (Signed)
Report to PACU, RN, vss, BBS= Clear.  

## 2016-01-16 NOTE — Progress Notes (Signed)
Called to room to assist during endoscopic procedure.  Patient ID and intended procedure confirmed with present staff. Received instructions for my participation in the procedure from the performing physician.  

## 2016-01-17 ENCOUNTER — Ambulatory Visit: Payer: Medicare Other | Admitting: Physical Therapy

## 2016-01-17 ENCOUNTER — Telehealth: Payer: Self-pay

## 2016-01-17 ENCOUNTER — Encounter: Payer: Medicare Other | Admitting: Occupational Therapy

## 2016-01-17 NOTE — Telephone Encounter (Signed)
  Follow up Call-  Call back number 01/16/2016  Post procedure Call Back phone  # [phone]  Permission to leave phone message Yes     Patient questions:  Do you have a fever, pain , or abdominal swelling? No. Pain Score  0 *  Have you tolerated food without any problems? Yes.    Have you been able to return to your normal activities? Yes.    Do you have any questions about your discharge instructions: Diet   No. Medications  No. Follow up visit  No.  Do you have questions or concerns about your Care? No.  Actions: * If pain score is 4 or above: No action needed, pain <4.  Pt was not available.  I spoke with the pt's wife, [name].  She reported he was fine. maw

## 2016-01-23 ENCOUNTER — Telehealth: Payer: Self-pay | Admitting: Neurology

## 2016-01-23 ENCOUNTER — Encounter: Payer: Self-pay | Admitting: Internal Medicine

## 2016-01-23 DIAGNOSIS — D126 Benign neoplasm of colon, unspecified: Secondary | ICD-10-CM | POA: Insufficient documentation

## 2016-01-23 DIAGNOSIS — Z86010 Personal history of colonic polyps: Secondary | ICD-10-CM

## 2016-01-23 DIAGNOSIS — Z860101 Personal history of adenomatous and serrated colon polyps: Secondary | ICD-10-CM

## 2016-01-23 HISTORY — DX: Personal history of adenomatous and serrated colon polyps: Z86.0101

## 2016-01-23 HISTORY — DX: Personal history of colonic polyps: Z86.010

## 2016-01-23 NOTE — Telephone Encounter (Signed)
We don't recommend stem cell treatment at this time for, especially not in atypical parkinsonism cases. Nevertheless, we can request a second opinion for his condition at a bigger movement disorder center if desired such as [facility] or [facility]. Please relay back to patient.

## 2016-01-23 NOTE — Telephone Encounter (Signed)
Daughter Stephens November [phone] called, patient saw newspaper ad regarding [facility], thinks this could help him. Would like to know Dr. Guadelupe Sabin thoughts on this. Daughter states patient doesn't want wife to know about this. Daughter advised that she is not listed on DPR or contact list.

## 2016-01-23 NOTE — Progress Notes (Signed)
Quick Note:  4 small adenomas Recall colonoscopy 2020 ______

## 2016-01-24 NOTE — Telephone Encounter (Signed)
Patient aware of advice. Has appt in summer and can follow up further.

## 2016-01-25 ENCOUNTER — Telehealth: Payer: Self-pay | Admitting: Neurology

## 2016-01-25 NOTE — Telephone Encounter (Signed)
I spoke to patient about the stem cell treatment. He voiced understanding.

## 2016-01-25 NOTE — Telephone Encounter (Signed)
Patient says he is returning your call.

## 2016-01-31 ENCOUNTER — Other Ambulatory Visit: Payer: Self-pay | Admitting: *Deleted

## 2016-01-31 MED ORDER — NEBIVOLOL HCL 2.5 MG PO TABS: 2.5000 mg | ORAL_TABLET | Freq: Every day | ORAL | Status: DC

## 2016-03-30 ENCOUNTER — Ambulatory Visit: Payer: Medicare Other | Admitting: Internal Medicine

## 2016-04-05 ENCOUNTER — Telehealth: Payer: Self-pay | Admitting: Internal Medicine

## 2016-04-05 NOTE — Telephone Encounter (Signed)
Pt called to let you know that he has researched Stem Cells and will discuss it with you when he comes into the office next week.

## 2016-04-13 ENCOUNTER — Encounter: Payer: Self-pay | Admitting: Internal Medicine

## 2016-04-13 ENCOUNTER — Ambulatory Visit (INDEPENDENT_AMBULATORY_CARE_PROVIDER_SITE_OTHER): Payer: Medicare Other | Admitting: Internal Medicine

## 2016-04-13 VITALS — BP 118/72 | HR 56 | Temp 97.5°F | Ht 70.0 in | Wt 200.0 lb

## 2016-04-13 DIAGNOSIS — G20 Parkinson's disease: Secondary | ICD-10-CM

## 2016-04-13 DIAGNOSIS — I421 Obstructive hypertrophic cardiomyopathy: Secondary | ICD-10-CM | POA: Diagnosis not present

## 2016-04-13 DIAGNOSIS — I10 Essential (primary) hypertension: Secondary | ICD-10-CM | POA: Diagnosis not present

## 2016-04-13 DIAGNOSIS — N529 Male erectile dysfunction, unspecified: Secondary | ICD-10-CM

## 2016-04-13 DIAGNOSIS — E291 Testicular hypofunction: Secondary | ICD-10-CM | POA: Diagnosis not present

## 2016-04-13 DIAGNOSIS — E349 Endocrine disorder, unspecified: Secondary | ICD-10-CM

## 2016-04-13 DIAGNOSIS — N528 Other male erectile dysfunction: Secondary | ICD-10-CM | POA: Diagnosis not present

## 2016-04-13 MED ORDER — LINACLOTIDE 290 MCG PO CAPS: 290.0000 ug | ORAL_CAPSULE | Freq: Every day | ORAL | Status: DC

## 2016-04-13 MED ORDER — TESTOSTERONE CYPIONATE 200 MG/ML IM SOLN
400.0000 mg | INTRAMUSCULAR | Status: DC
  Administered 2016-04-13: 400 mg via INTRAMUSCULAR

## 2016-04-13 NOTE — Patient Instructions (Signed)
Follow-up neurology as scheduled  Please check your blood pressure on a regular basis.  If it is consistently greater than 150/90, please make an office appointment.  Return in 6 months for follow-up Constipation, Adult Constipation is when a person:  Poops (has a bowel movement) less than 3 times a week.  Has a hard time pooping.  Has poop that is dry, hard, or bigger than normal. HOME CARE   Eat foods with a lot of fiber in them. This includes fruits, vegetables, beans, and whole grains such as brown rice.  Avoid fatty foods and foods with a lot of sugar. This includes french fries, hamburgers, cookies, candy, and soda.  If you are not getting enough fiber from food, take products with added fiber in them (supplements).  Drink enough fluid to keep your pee (urine) clear or pale yellow.  Exercise on a regular basis, or as told by your doctor.  Go to the restroom when you feel like you need to poop. Do not hold it.  Only take medicine as told by your doctor. Do not take medicines that help you poop (laxatives) without talking to your doctor first. GET HELP RIGHT AWAY IF:   You have bright red blood in your poop (stool).  Your constipation lasts more than 4 days or gets worse.  You have belly (abdominal) or butt (rectal) pain.  You have thin poop (as thin as a pencil).  You lose weight, and it cannot be explained. MAKE SURE YOU:   Understand these instructions.  Will watch your condition.  Will get help right away if you are not doing well or get worse.   This information is not intended to replace advice given to you by your health care provider. Make sure you discuss any questions you have with your health care provider.   Document Released: 03/12/2008 Document Revised: 10/15/2014 Document Reviewed: 07/06/2013 Elsevier Interactive Patient Education [employer].

## 2016-04-13 NOTE — Progress Notes (Signed)
Pre visit review using our clinic review tool, if applicable. No additional management support is needed unless otherwise documented below in the visit note. 

## 2016-04-13 NOTE — Progress Notes (Signed)
Subjective:    Patient ID: [name], male    DOB: 29-Nov-1945, 70 y.o.   MRN: [id number]  HPI  70 year old patient who is seen today for his biannual follow-up.  He is followed by cardiology for hypertrophic cardiomyopathy without outflow obstruction.  He is also seen at least twice annually by neurology due to parkinsonism. He has essential hypertension and allergic rhinitis.  He has dyslipidemia.  He is also followed by urology and does have testosterone deficiency.  He has recently been placed on Proscar  Past Medical History  Diagnosis Date  . HTN (hypertension)   . Glaucoma   . Allergic rhinitis   . Parkinsonism ([place]) 01/16/2013    no tremors, balance issues only , speech and swallowing difficulties   . Hyperlipidemia   . Thyroid disease     HYPOTHYROID  . Hypogonadism male   . Pre-diabetes   . Complication of anesthesia     Slow to awaken  . Dysphagia   . Hypothyroidism   . Constipation   . GERD (gastroesophageal reflux disease)     Decdreased, has decreased coffee intack.  . Arthritis   . DDD (degenerative disc disease), cervical   . Cancer ([place])     basal cell skin cancer  . OSA (obstructive sleep apnea)     Has had surgery x 2  . Allergy   . At risk for falls   . Sleep apnea     surgery x 2- no cpap  . Cataract   . Staph infection 10-2014  . Edema extremities 01/13/2016  . Hx of adenomatous colonic polyps 01/23/2016     Social History   Social History  . Marital Status: Married    Spouse Name: linda  . Number of Children: 2  . Years of Education: college   Occupational History  . branch Librarian, academic   .     Social History Main Topics  . Smoking status: Never Smoker   . Smokeless tobacco: Never Used  . Alcohol Use: No  . Drug Use: No  . Sexual Activity: Not on file   Other Topics Concern  . Not on file   Social History Narrative    Past Surgical History  Procedure Laterality Date  . Uvulopalatopharyngoplasty    . Lumbar disc  surgery  04-2015  . Cataract extraction    . I&d extremity Right 10/18/2014    Procedure: IRRIGATION AND DEBRIDEMENT RIGHT KNEE PREPATELLA BURSA INFECTION;  Surgeon: [name], MD;  Location: [facility];  Service: Orthopedics;  Laterality: Right;  . Tonsillectomy      and adenoids  . Nasal septum surgery    . Lumbar laminectomy with coflex 1 level N/A 06/06/2015    Procedure: Lumbar three-four Laminectomy/Foraminotomy with placement of Coflex;  Surgeon: [name], MD;  Location: [facility];  Service: Neurosurgery;  Laterality: N/A;  . Colonoscopy  04-27-2004    tics, hems     Family History  Problem Relation Age of Onset  . Emphysema Mother   . COPD Mother   . Heart disease Mother   . Hypertension Mother   . Liver cancer Father   . Cancer Father   . Diabetes Father   . Hypertension Father   . Colon polyps Father   . Heart disease Sister   . COPD Sister   . Heart attack Sister   . Hypertension Sister   . Stroke Neg Hx   . Colon cancer Neg Hx   . Colon  polyps Brother     Allergies  Allergen Reactions  . Ambien [Zolpidem Tartrate]     Unknown- balance  . Atenolol     unknown  . Lunesta [Eszopiclone]     unknown  . Oxazepam     unknown  . Requip [Ropinirole Hcl]     unknown  . Ziac [Bisoprolol-Hydrochlorothiazide]     unknown    Current Outpatient Prescriptions on File Prior to Visit  Medication Sig Dispense Refill  . aspirin 81 MG tablet Take 81 mg by mouth daily.    . Cholecalciferol (VITAMIN D3) 10000 UNITS capsule Take 5,000 Units by mouth daily. Mon. ,[id number] weekly    . fluticasone (FLONASE) 50 MCG/ACT nasal spray USE 1-2 SPRAYS IN EACH NOSTRIL EVERY DAY 16 g 6  . levothyroxine (SYNTHROID, LEVOTHROID) 50 MCG tablet TAKE 1 TABLET BY MOUTH EVERY DAY 30 tablet 5  . loratadine (CLARITIN) 10 MG tablet Take 10 mg by mouth daily.    Marland Kitchen losartan (COZAAR) 100 MG tablet TAKE 1 TABLET BY MOUTH DAILY 30 tablet 5  . magnesium gluconate (MAGONATE) 500 MG tablet Take  500 mg by mouth as needed.    . Multiple Vitamins-Minerals (MULTIVITAMIN WITH MINERALS) tablet Take 1 tablet by mouth daily.      . mupirocin ointment (BACTROBAN) 2 % Reported on 01/02/2016  3  . nebivolol (BYSTOLIC) 2.5 MG tablet Take 1 tablet (2.5 mg total) by mouth daily. 30 tablet 11  . Omega-3 Fatty Acids (FISH OIL) 1000 MG CAPS Take 1,200 mg by mouth daily. Pt takes 2400 mg on Tues, Thurs and Sat.    . testosterone cypionate (DEPO-TESTOSTERONE) 200 MG/ML injection Inject 2 mLs (400 mg total) into the muscle every 21 ( twenty-one) days. 10 mL 5  . timolol (TIMOPTIC) 0.5 % ophthalmic solution Place 1 drop into both eyes 2 (two) times daily.   12   No current facility-administered medications on file prior to visit.    BP 118/72 mmHg  Pulse 56  Temp(Src) 97.5 F (36.4 C) (Oral)  Ht 5\' 10"  (1.778 m)  Wt 200 lb (90.719 kg)  BMI 28.70 kg/m2  SpO2 97%     Review of Systems  Constitutional: Negative for fever, chills, appetite change and fatigue.  HENT: Positive for rhinorrhea and voice change. Negative for congestion, dental problem, ear pain, hearing loss, sore throat, tinnitus and trouble swallowing.   Eyes: Negative for pain, discharge and visual disturbance.  Respiratory: Negative for cough, chest tightness, wheezing and stridor.   Cardiovascular: Negative for chest pain, palpitations and leg swelling.  Gastrointestinal: Positive for constipation. Negative for nausea, vomiting, abdominal pain, diarrhea, blood in stool and abdominal distention.  Genitourinary: Negative for urgency, hematuria, flank pain, discharge, difficulty urinating and genital sores.  Musculoskeletal: Positive for gait problem. Negative for myalgias, back pain, joint swelling, arthralgias and neck stiffness.  Skin: Negative for rash.  Neurological: Negative for dizziness, syncope, speech difficulty, weakness, numbness and headaches.  Hematological: Negative for adenopathy. Does not bruise/bleed easily.    Psychiatric/Behavioral: Negative for behavioral problems and dysphoric mood. The patient is not nervous/anxious.        Objective:   Physical Exam  Constitutional: He is oriented to person, place, and time. He appears well-developed.  Blood pressure low normal Uses a walker  HENT:  Head: Normocephalic.  Right Ear: External ear normal.  Left Ear: External ear normal.  Eyes: Conjunctivae and EOM are normal.  Neck: Normal range of motion.  Cardiovascular: Normal rate and normal heart  sounds.   Pulmonary/Chest: Breath sounds normal.  Abdominal: Bowel sounds are normal.  Musculoskeletal: Normal range of motion. He exhibits no edema or tenderness.  Neurological: He is alert and oriented to person, place, and time.  Parkinsonian facies Soft voice  Psychiatric: He has a normal mood and affect. His behavior is normal.          Assessment & Plan:   Essential hypertension, well-controlled Testosterone deficiency.  Patient is followed by urology.  Patient states that he wishes to discontinue testosterone replacement Chronic constipation.  Patient is requesting a trial of Linzess.  New prescription dispensed.  He states that [place] has not been effective in the past Hypertrophic cardiopathy.  Follow-up cardiology Primary parkinsonism.  Follow-up neurology  No change in medical regimen CPX 6 months  Nyoka Cowden, MD

## 2016-04-18 ENCOUNTER — Other Ambulatory Visit: Payer: Self-pay | Admitting: Internal Medicine

## 2016-04-23 ENCOUNTER — Telehealth: Payer: Self-pay | Admitting: Neurology

## 2016-04-23 DIAGNOSIS — G20 Parkinson's disease: Secondary | ICD-10-CM

## 2016-04-23 NOTE — Telephone Encounter (Signed)
Request entered for outpatient PT, OT, and ST for evaluation and treatment for his parkinsonism. Please refer to neuro rehabilitation unit.

## 2016-04-24 ENCOUNTER — Encounter: Payer: Medicare Other | Admitting: Occupational Therapy

## 2016-04-24 ENCOUNTER — Ambulatory Visit: Payer: Medicare Other | Admitting: Physical Therapy

## 2016-04-24 NOTE — Telephone Encounter (Signed)
Order is in, to be processed by Hinton Dyer or Hilda Blades.

## 2016-04-26 ENCOUNTER — Ambulatory Visit: Payer: Medicare Other

## 2016-04-26 ENCOUNTER — Ambulatory Visit: Payer: Medicare Other | Admitting: Physical Therapy

## 2016-04-26 ENCOUNTER — Ambulatory Visit: Payer: Medicare Other | Attending: Neurology | Admitting: Occupational Therapy

## 2016-04-26 DIAGNOSIS — M6281 Muscle weakness (generalized): Secondary | ICD-10-CM

## 2016-04-26 DIAGNOSIS — R1312 Dysphagia, oropharyngeal phase: Secondary | ICD-10-CM

## 2016-04-26 DIAGNOSIS — R29898 Other symptoms and signs involving the musculoskeletal system: Secondary | ICD-10-CM

## 2016-04-26 DIAGNOSIS — R29818 Other symptoms and signs involving the nervous system: Secondary | ICD-10-CM

## 2016-04-26 DIAGNOSIS — R41841 Cognitive communication deficit: Secondary | ICD-10-CM | POA: Insufficient documentation

## 2016-04-26 DIAGNOSIS — R41840 Attention and concentration deficit: Secondary | ICD-10-CM | POA: Diagnosis not present

## 2016-04-26 DIAGNOSIS — R4701 Aphasia: Secondary | ICD-10-CM | POA: Diagnosis not present

## 2016-04-26 DIAGNOSIS — R471 Dysarthria and anarthria: Secondary | ICD-10-CM | POA: Insufficient documentation

## 2016-04-26 DIAGNOSIS — R2689 Other abnormalities of gait and mobility: Secondary | ICD-10-CM

## 2016-04-26 DIAGNOSIS — R278 Other lack of coordination: Secondary | ICD-10-CM | POA: Insufficient documentation

## 2016-04-26 DIAGNOSIS — R2681 Unsteadiness on feet: Secondary | ICD-10-CM

## 2016-04-27 NOTE — Patient Instructions (Signed)
We will work on people understanding you, since that bothers you.  I will check in on your swallowing every once in a while during therapy.

## 2016-04-27 NOTE — Therapy (Signed)
[facility] [address] [place], [place], [address] Phone: [phone]   Fax:  [phone]  Speech Language Pathology Evaluation  Patient Details  Name: [name] MRN: [id number] Date of Birth: 12/30/1945 Referring Provider: Alonza Bogus, DO  Encounter Date: 04/26/2016      End of Session - 04/27/16 1721    Visit Number 1   Number of Visits 9   Date for SLP Re-Evaluation 06/22/16   SLP Start Time 44   SLP Stop Time  1146   SLP Time Calculation (min) 41 min   Activity Tolerance Patient tolerated treatment well      Past Medical History  Diagnosis Date  . HTN (hypertension)   . Glaucoma   . Allergic rhinitis   . Parkinsonism ([place]) 01/16/2013    no tremors, balance issues only , speech and swallowing difficulties   . Hyperlipidemia   . Thyroid disease     HYPOTHYROID  . Hypogonadism male   . Pre-diabetes   . Complication of anesthesia     Slow to awaken  . Dysphagia   . Hypothyroidism   . Constipation   . GERD (gastroesophageal reflux disease)     Decdreased, has decreased coffee intack.  . Arthritis   . DDD (degenerative disc disease), cervical   . Cancer ([place])     basal cell skin cancer  . OSA (obstructive sleep apnea)     Has had surgery x 2  . Allergy   . At risk for falls   . Sleep apnea     surgery x 2- no cpap  . Cataract   . Staph infection 10-2014  . Edema extremities 01/13/2016  . Hx of adenomatous colonic polyps 01/23/2016    Past Surgical History  Procedure Laterality Date  . Uvulopalatopharyngoplasty    . Lumbar disc surgery  04-2015  . Cataract extraction    . I&d extremity Right 10/18/2014    Procedure: IRRIGATION AND DEBRIDEMENT RIGHT KNEE PREPATELLA BURSA INFECTION;  Surgeon: [name], MD;  Location: [facility];  Service: Orthopedics;  Laterality: Right;  . Tonsillectomy      and adenoids  . Nasal septum surgery    . Lumbar laminectomy with coflex 1 level N/A 06/06/2015     Procedure: Lumbar three-four Laminectomy/Foraminotomy with placement of Coflex;  Surgeon: [name], MD;  Location: [facility];  Service: Neurosurgery;  Laterality: N/A;  . Colonoscopy  04-27-2004    tics, hems     There were no vitals filed for this visit.      Subjective Assessment - 04/26/16 1133    Subjective SLP: "What is most frustrating/bothersome right now?" pt:"Having people understand me."   Patient is accompained by: --  wife   Currently in Pain? No/denies            SLP Evaluation [facility] - 04/26/16 1133    SLP Visit Information   SLP Received On 04/26/16   Referring Provider Tat, Wells Guiles, DO   Medical Diagnosis Parkinsonism   Cognition   Overall Cognitive Status Impaired/Different from baseline   Area of Impairment Memory   Memory Decreased recall of precautions;Decreased short-term memory   Safety/Judgement Decreased awareness of safety;Decreased awareness of deficits   Awareness Intellectual   Memory Impaired   Awareness Impaired   Behaviors Impulsive;Poor frustration tolerance   Auditory Comprehension   Overall Auditory Comprehension Appears within functional limits for tasks assessed   Verbal Expression   Overall Verbal Expression Impaired   Interfering  Components Attention;Speech intelligibility   Other Verbal Expression Comments Pt with anomia during conversation (mild). Pt states dysarthria is most bothersome but "Can't think of the word" is as well but less-so than decr'd intelligibility   Oral Motor/Sensory Function   Overall Oral Motor/Sensory Function Impaired   Labial Coordination Reduced   Lingual Strength Reduced   Lingual Coordination Reduced   Facial Coordination Reduced   Overall Oral Motor/Sensory Function Difficult to test for labial strength and lingual and labial ROM.   Motor Speech   Overall Motor Speech Impaired at baseline   Respiration Impaired   Level of Impairment Word   Phonation Low vocal intensity;Hoarse   Articulation  Impaired   Level of Impairment Word   Motor Speech Errors Inconsistent;Aware   Interfering Components Premorbid status   Effective Techniques Over-articulate;Slow rate;Pacing  improves speech intelligibility slightly     Pt's average conversational volume was 65dB (70-72 is WNL). When moderately cued to speak louder, usually, pt incr'd loudness to 67dB average.   SLP educated pt's wife and pt on beginning to think how pt can simplify his communication and maximize his communicative message - (i.e., board with frequent requests/needs/sentences numbered so that pt only needs to say number or show number on hands).  The patient has eliminated most regular food items from his diet and is consuming mostly dys III items at this time. He reports coughing approx 2 times a week during meals. Wife did not disagree with this frequency provided by pt.                        SLP Long Term Goals - 04/27/16 1724    SLP LONG TERM GOAL #1   Title pt will demo 85% intelligibility using compensations in 5 minute conversation    Time 4   Period Weeks   Status New   SLP LONG TERM GOAL #2   Title pt/wife will tell SLP3 ways to improve pt's communicative effectiveness by manipulating environment for pt's benefit   Time 4   Period Weeks   Status New   SLP LONG TERM GOAL #3   Title pt/wife will demo understanding of 4 s/s aspiration PNA   Time 4   Period Weeks   Status New          Plan - 04/27/16 1722    Clinical Impression Statement Pt presents with dysphagia, dysarthria, and reported expressive aphasia. No difficulty exhibited in word finding during evaluation. Pt would benefit from refresher course for skilled ST targeting incr'd speech intelligibility, and for caregiver education re: how to structure pt's comunicative intents for maximal engagement/understanding.   Speech Therapy Frequency 2x / week   Duration 4 weeks  or 8 visits   Treatment/Interventions Diet toleration  management by SLP;Aspiration precaution training;SLP instruction and feedback;Compensatory strategies;Language facilitation;Patient/family education;Functional tasks;Cueing hierarchy;Internal/external aids;Environmental controls   Potential to Achieve Goals Fair   Potential Considerations Ability to learn/carryover information;Severity of impairments   Consulted and Agree with Plan of Care Patient;Family member/caregiver      Patient will benefit from skilled therapeutic intervention in order to improve the following deficits and impairments:   Dysarthria and anarthria  Oropharyngeal dysphagia  Aphasia  Cognitive communication deficit      G-Codes - 05-18-2016 1728    Functional Assessment Tool Used NOMS - 4 60% impaired   Functional Limitations Motor speech   Motor Speech Current Status [phone]) At least 60 percent but less than 80 percent impaired, limited  or restricted   Motor Speech Goal Status [phone]) At least 40 percent but less than 60 percent impaired, limited or restricted      Problem List Patient Active Problem List   Diagnosis Date Noted  . Hx of adenomatous colonic polyps 01/23/2016  . Edema extremities 01/13/2016  . Lumbar stenosis 06/06/2015  . HOCM (hypertrophic obstructive cardiomyopathy) ([place]) 12/12/2014  . Infection of right prepatellar bursa 10/18/2014  . DOE (dyspnea on exertion) 09/06/2014  . Fatigue due to excessive exertion 09/06/2014  . Testosterone deficiency 11/12/2013  . Routine general medical examination at a health care facility 08/30/2013  . Mixed hyperlipidemia 08/30/2013  . Other abnormal glucose 08/30/2013  . 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 Parkinsonism ([place]) 01/16/2013  . INSOMNIA 06/06/2008  . GLAUCOMA 06/01/2008  . Essential hypertension 06/01/2008  . Seasonal and perennial allergic rhinitis 06/01/2008    [facility] ,MS, CCC-SLP   04/27/2016, 5:31 PM  [facility] [address] [place], [place], [address] Phone: [phone]   Fax:  [phone]  Name: [name] MRN: [id number] Date of Birth: 05/23/1946

## 2016-04-27 NOTE — Therapy (Signed)
[facility] [address] [place], [place], [address] Phone: [phone]   Fax:  [phone]  Physical Therapy Evaluation  Patient Details  Name: [name] MRN: [id number] Date of Birth: 23-Oct-1945 Referring Provider: Rexene Alberts  Encounter Date: 04/26/2016      PT End of Session - 04/27/16 1214    Visit Number 1   Number of Visits 17   Date for PT Re-Evaluation 06/26/16   Authorization Type Medicare/BCBS-GCODE every 10th visit   PT Start Time 0935   PT Stop Time 1020   PT Time Calculation (min) 45 min   Equipment Utilized During Treatment Gait belt   Activity Tolerance Patient tolerated treatment well   Behavior During Therapy [facility] for tasks assessed/performed      Past Medical History  Diagnosis Date  . HTN (hypertension)   . Glaucoma   . Allergic rhinitis   . Parkinsonism ([place]) 01/16/2013    no tremors, balance issues only , speech and swallowing difficulties   . Hyperlipidemia   . Thyroid disease     HYPOTHYROID  . Hypogonadism male   . Pre-diabetes   . Complication of anesthesia     Slow to awaken  . Dysphagia   . Hypothyroidism   . Constipation   . GERD (gastroesophageal reflux disease)     Decdreased, has decreased coffee intack.  . Arthritis   . DDD (degenerative disc disease), cervical   . Cancer ([place])     basal cell skin cancer  . OSA (obstructive sleep apnea)     Has had surgery x 2  . Allergy   . At risk for falls   . Sleep apnea     surgery x 2- no cpap  . Cataract   . Staph infection 10-2014  . Edema extremities 01/13/2016  . Hx of adenomatous colonic polyps 01/23/2016    Past Surgical History  Procedure Laterality Date  . Uvulopalatopharyngoplasty    . Lumbar disc surgery  04-2015  . Cataract extraction    . I&d extremity Right 10/18/2014    Procedure: IRRIGATION AND DEBRIDEMENT RIGHT KNEE PREPATELLA BURSA INFECTION;  Surgeon: [name], MD;  Location: [facility];  Service:  Orthopedics;  Laterality: Right;  . Tonsillectomy      and adenoids  . Nasal septum surgery    . Lumbar laminectomy with coflex 1 level N/A 06/06/2015    Procedure: Lumbar three-four Laminectomy/Foraminotomy with placement of Coflex;  Surgeon: [name], MD;  Location: [facility] NEURO ORS;  Service: Neurosurgery;  Laterality: N/A;  . Colonoscopy  04-27-2004    tics, hems     There were no vitals filed for this visit.       Subjective Assessment - 04/26/16 0939    Subjective Still falling some-wife reports 2-3 falls since January.  Last fall was several weeks ago in the bathroom.  Wife report the bathroom is a common place for falls. So far, pt is able to get up from falling on his own.  Wife reports he puts his walker away ("in a parking space") and the goes to sit.   Patient is accompained by: Family member  wife, [name]   Patient Stated Goals Pt's goals for therapy is to improve balance and strengthening.   Currently in Pain? No/denies            [facility] PT Assessment - 04/26/16 0943    Assessment   Medical Diagnosis Parkinsonism   Referring Provider [place]   Precautions  Precautions Fall   Balance Screen   Has the patient fallen in the past 6 months Yes   How many times? 2-3   Has the patient had a decrease in activity level because of a fear of falling?  No   Is the patient reluctant to leave their home because of a fear of falling?  No   Home Ecologist residence   Living Arrangements Spouse/significant other   Available Help at Discharge Family   Type of [place] to enter   Entrance Stairs-Number of Steps 3   Entrance Stairs-Rails Right   [place] Two level;Able to live on main level with bedroom/bathroom   [place] - 2 wheels;[place] - single point   Prior Function   Level of Independence Requires assistive device for independence   Leisure Enjoyed exercising at the [employer], golfing, walkin   ROM /  Strength   AROM / PROM / Strength Strength;AROM;PROM   AROM   Overall AROM  Deficits   Overall AROM Comments R ankle dorsiflexion actively lacks 18 degrees from neutral   PROM   Overall PROM  Deficits   Overall PROM Comments R ankle dorsiflexion neutral   Strength   Overall Strength Deficits   Strength Assessment Site Hip;Knee;Ankle   Right/Left Hip Right;Left   Right Hip Flexion 4/5   Left Hip Flexion 4/5   Right/Left Knee Right;Left   Right Knee Flexion 4/5   Right Knee Extension 4/5   Left Knee Flexion 3+/5   Left Knee Extension 3+/5   Right/Left Ankle Right;Left   Right Ankle Dorsiflexion 3-/5   Left Ankle Dorsiflexion 3+/5   Transfers   Transfers Sit to Stand;Stand to Sit   Sit to Stand 5: Supervision;With upper extremity assist;From chair/3-in-1   Stand to Sit 5: Supervision;With upper extremity assist;To chair/3-in-1   Ambulation/Gait   Ambulation/Gait Yes   Ambulation/Gait Assistance 5: Supervision;4: Min guard   Ambulation Distance (Feet) 750 Feet   Assistive device Rolling walker   Gait Pattern Step-through pattern;Decreased step length - left;Decreased dorsiflexion - right;Right foot flat;Trunk flexed  R foot slap with prolonged gait   Ambulation Surface Level;Indoor   Gait velocity 14.85= 2.21 ft/sec   6 Minute Walk- Baseline   6 Minute Walk- Baseline --  710 feet in 6 min walk test   Standardized Balance Assessment   Standardized Balance Assessment Timed Up and Go Test;Berg Balance Test   Berg Balance Test   Sit to Stand Able to stand  independently using hands   Standing Unsupported Able to stand safely 2 minutes   Sitting with Back Unsupported but Feet Supported on Floor or Stool Able to sit safely and securely 2 minutes   Stand to Sit Sits independently, has uncontrolled descent   Transfers Able to transfer with verbal cueing and /or supervision   Standing Unsupported with Eyes Closed Able to stand 10 seconds with supervision   Standing Ubsupported with  Feet Together Needs help to attain position and unable to hold for 15 seconds   From Standing, Reach Forward with Outstretched Arm Reaches forward but needs supervision   From Standing Position, Pick up Object from Floor Unable to try/needs assist to keep balance   From Standing Position, Turn to Look Behind Over each Shoulder Turn sideways only but maintains balance   Turn 360 Degrees Needs assistance while turning   Standing Unsupported, Alternately Place Feet on Step/Stool Needs assistance to keep from falling  or unable to try   Standing Unsupported, One Foot in [place] to take small step independently and hold 30 seconds   Standing on One Leg Unable to try or needs assist to prevent fall   Total Score 22   Berg comment: Scores <45/56 indicate increased fall risk.   Timed Up and Go Test   Normal TUG (seconds) 26.36   TUG Comments Scores >13.5 sec indicate increased fall risk.                             PT Short Term Goals - 04/27/16 1219    PT SHORT TERM GOAL #1   Title Pt will perform HEP for strengthening, balance and gait with wife's supervision.  TARGET 05/26/16   Time 4   Period Weeks   Status New   PT SHORT TERM GOAL #2   Title Pt will perform at least 8 of 10 reps of sit<>stand transfers from 18 inch surfaces with minimal UE support and no posterior lean, for improved safety and efficiency of transfers.   Time 4   Period Weeks   Status New   PT SHORT TERM GOAL #3   Title Pt will improve TUG socre to less than or equal to 23 seconds for decreased fall risk.   Time 4   Period Weeks   Status New   PT SHORT TERM GOAL #4   Title Pt/wife will verbalize understanding of techniques to decrease festinating/freezing of gait and turns   Time 4   Period Weeks   Status New           PT Long Term Goals - 04/27/16 1224    PT LONG TERM GOAL #1   Title Pt/wife will verbalize understanding of fall prevention within the home.  TARGET 06/26/16   Time 8    Period Weeks   Status New   PT LONG TERM GOAL #2   Title Pt will improve Berg Balance score to at least 27/56 for decreased fall risk.   Time 8   Period Weeks   Status New   PT LONG TERM GOAL #3   Title Pt will ambulate at least 750 ft. in 3 minute walk test for improved gait efficiency and safety.   Time 8   Period Weeks   Status New   PT LONG TERM GOAL #4   Title Pt/wife will report at least 25% improvement in turns and in bathroom spaces for decreased fall risk.   Time 8   Status New   PT LONG TERM GOAL #5   Title Pt will verbalize plans for continued community fitness upon D/C from PT.   Time 8   Period Weeks   Status New               Plan - 04/27/16 1215    Clinical Impression Statement Pt is a 70 year old male who presents to OP PT with Parkinsonism with history of falls.  Pt particularly feels he has lost strength since last bout of therapy in January.  He presents wtih decreased lower extremity strength, decreased flexibility, decreased balance, decreased transfer and gait safety.  Pt would benefit from skilled physical therapy to address the above stated deficits to participate in functional mobility tasks and decrease fall risk.   Rehab Potential Good   PT Frequency 2x / week   PT Duration 8 weeks   PT Treatment/Interventions ADLs/Self Care Home Management;Therapeutic exercise;Therapeutic  activities;Functional mobility training;Gait training;Balance training;Neuromuscular re-education;Patient/family education   PT Next Visit Plan lower extremity strengthening using theraband, practice transfers, including fully turning to sit as part of HEP   Consulted and Agree with Plan of Care Patient      Patient will benefit from skilled therapeutic intervention in order to improve the following deficits and impairments:  Abnormal gait, Decreased balance, Decreased activity tolerance, Decreased range of motion, Decreased safety awareness, Decreased mobility, Decreased  strength, Postural dysfunction, Impaired flexibility  Visit Diagnosis: Other abnormalities of gait and mobility  Unsteadiness on feet  Muscle weakness (generalized)  Other symptoms and signs involving the nervous system      G-Codes - 05/27/2016 1230    Functional Assessment Tool Used Gait velocity 2.21 ft/sec, TUG >26 sec, BERG 22/56, 710 ft in 6 minutes   Functional Limitation Mobility: Walking and moving around   Mobility: Walking and Moving Around Current Status [phone]) At least 40 percent but less than 60 percent impaired, limited or restricted   Mobility: Walking and Moving Around Goal Status [phone]) At least 20 percent but less than 40 percent impaired, limited or restricted       Problem List Patient Active Problem List   Diagnosis Date Noted  . Hx of adenomatous colonic polyps 01/23/2016  . Edema extremities 01/13/2016  . Lumbar stenosis 06/06/2015  . HOCM (hypertrophic obstructive cardiomyopathy) ([place]) 12/12/2014  . Infection of right prepatellar bursa 10/18/2014  . DOE (dyspnea on exertion) 09/06/2014  . Fatigue due to excessive exertion 09/06/2014  . Testosterone deficiency 11/12/2013  . Routine general medical examination at a health care facility 08/30/2013  . Mixed hyperlipidemia 08/30/2013  . Other abnormal glucose 08/30/2013  . 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 Parkinsonism ([place]) 01/16/2013  . INSOMNIA 06/06/2008  . GLAUCOMA 06/01/2008  . Essential hypertension 06/01/2008  . Seasonal and perennial allergic rhinitis 06/01/2008    Fabiha Rougeau W. 2016-05-27, 12:31 PM Frazier Butt., PT [facility] [address] [place], [place], [address] Phone: [phone]   Fax:  [phone]  Name: [name] MRN: [id number] Date of Birth: 1945-10-14

## 2016-04-27 NOTE — Therapy (Addendum)
[facility] [address] [place], [place], [address] Phone: [phone]   Fax:  [phone]  Occupational Therapy Evaluation  Patient Details  Name: [name] MRN: [id number] Date of Birth: [birth year] Referring Provider: Dr. Rexene Alberts  Encounter Date: 04/26/2016      OT End of Session - 04/27/16 1516    Visit Number 1   Number of Visits 17   Date for OT Re-Evaluation 06/25/16   Authorization Type Medicare   Authorization - Number of Visits 10   OT Start Time 1020   OT Stop Time 1100   OT Time Calculation (min) 40 min   Activity Tolerance Patient tolerated treatment well   Behavior During Therapy [facility] for tasks assessed/performed      Past Medical History  Diagnosis Date  . HTN (hypertension)   . Glaucoma   . Allergic rhinitis   . Parkinsonism ([place]) 01/16/2013    no tremors, balance issues only , speech and swallowing difficulties   . Hyperlipidemia   . Thyroid disease     HYPOTHYROID  . Hypogonadism male   . Pre-diabetes   . Complication of anesthesia     Slow to awaken  . Dysphagia   . Hypothyroidism   . Constipation   . GERD (gastroesophageal reflux disease)     Decdreased, has decreased coffee intack.  . Arthritis   . DDD (degenerative disc disease), cervical   . Cancer ([place])     basal cell skin cancer  . OSA (obstructive sleep apnea)     Has had surgery x 2  . Allergy   . At risk for falls   . Sleep apnea     surgery x 2- no cpap  . Cataract   . Staph infection 10-2014  . Edema extremities 01/13/2016  . Hx of adenomatous colonic polyps 01/23/2016    Past Surgical History  Procedure Laterality Date  . Uvulopalatopharyngoplasty    . Lumbar disc surgery  04-2015  . Cataract extraction    . I&d extremity Right 10/18/2014    Procedure: IRRIGATION AND DEBRIDEMENT RIGHT KNEE PREPATELLA BURSA INFECTION;  Surgeon: [name], MD;  Location: [facility];  Service: Orthopedics;  Laterality: Right;  .  Tonsillectomy      and adenoids  . Nasal septum surgery    . Lumbar laminectomy with coflex 1 level N/A 06/06/2015    Procedure: Lumbar three-four Laminectomy/Foraminotomy with placement of Coflex;  Surgeon: [name], MD;  Location: [place] [facility] NEURO ORS;  Service: Neurosurgery;  Laterality: N/A;  . Colonoscopy  04-27-2004    tics, hems     There were no vitals filed for this visit.      Subjective Assessment - 04/27/16 1514    Subjective  Pt reports difficulty with washing and brushing hair.   Pertinent History see Epic   Patient Stated Goals improved ADL performance, decreased stiffness   Currently in Pain? No/denies           [facility] OT Assessment - 04/27/16 0001    Assessment   Diagnosis Parkinsonism   Referring Provider Dr. Rexene Alberts   Onset Date 04/23/16   Prior Therapy OT, PT,ST   Precautions   Precautions Fall   Precaution Comments hx of back surgery   Balance Screen   Has the patient fallen in the past 6 months Yes   How many times? 10   Has the patient had a decrease in activity level because of a fear of falling?  Yes  Is the patient reluctant to leave their home because of a fear of falling?  No   Home  Environment   Family/patient expects to be discharged to: Private residence   Living Arrangements Spouse/significant other   [place] Two level  stays on main level   Lives With Spouse   Prior Function   Level of Independence Needs assistance with ADLs   ADL   Eating/Feeding Supervision/safety  feeds self too fast   Grooming Minimal assistance   Grooming details --  needs assist with brushing hair   Upper Body Bathing Supervision/safety  difficulty washing hair   Lower Body Bathing Supervision/safety   Upper Body Dressing Set up   Lower Body Dressing Moderate assistance  needs assist with socks and shoes   Toilet Tranfer Modified independent  with rolling walker, per pt wife report hx of multiple falls   Tub/Shower Transfer Supervision/safety   ADL  comments Pt reports increased difficulty with combing and washing hair.  Pt experiences freezing episodes   Mobility   Mobility Status History of falls  supervision   Written Expression   Handwriting 75% legible  legibility is inconsistent, moderate to severe micrographia   Cognition   Overall Cognitive Status Impaired/Different from baseline   Area of Impairment Memory;Attention;Safety/judgement;Awareness;Problem solving   Memory Decreased recall of precautions;Decreased short-term memory   Safety/Judgement Decreased awareness of safety;Decreased awareness of deficits   Awareness Intellectual   Problem Solving Difficulty sequencing   Attention Sustained   Sustained Attention Impaired   Memory Impaired   Memory Impairment Decreased short term memory   Awareness Impaired   Awareness Impairment Intellectual impairment   Problem Solving Impaired   Behaviors Impulsive;Poor frustration tolerance;Perseveration   Observation/Other Assessments   Physical Performance Test   Yes   Simulated Eating Time (seconds) 14.31 secs   Donning Doffing Jacket Comments 3 button / unbutton test: 52.79 secs   Coordination   Gross Motor Movements are Fluid and Coordinated No   Fine Motor Movements are Fluid and Coordinated No   9 Hole Peg Test Right;Left   Right 9 Hole Peg Test 28.69 secs   Left 9 Hole Peg Test 27.94 secs   Box and Blocks RUE 40 blocks, LUE 53 blocks   Coordination Pt demonstrates decreased timing  and moves too quickly with hypokinesia at times,    Tone   Assessment Location Right Upper Extremity;Left Upper Extremity   RUE Tone   RUE Tone Mild  rigidity   LUE Tone   LUE Tone Mild  rigidity                           OT Short Term Goals - 04/27/16 1505    OT SHORT TERM GOAL #1   Title Pt will perform updated HEP with supervision/min cues.   Time 4   Period Weeks   Status New   OT SHORT TERM GOAL #2   Title Pt will write name with at least 90% legibility  at least 2/3 trials   Time 4   Period Weeks   Status New   OT SHORT TERM GOAL #3   Title Pt will report incr ease with washing hair.   Baseline Pt reports difficulty and significant freezing   Time 4   Period Weeks   Status New   OT SHORT TERM GOAL #4   Title Pt will demonstrate increased ease with dressing as evidenced by decreasing 3 button/ unbutton score to 45  secs or less   Baseline 52.79 secs   Time 4   Period Weeks   Status New           OT Long Term Goals - 04/27/16 1506    OT LONG TERM GOAL #1   Title Pt/caregiver will verbalize understanding of adaptive strategies/AE for ADLs prn.   Time 8   Period Weeks   Status New   OT LONG TERM GOAL #2   Title Pt will demonstrate improved RUE functional use as evidenced by increasing box/ blocks score to 44 blocks   Baseline 40   Time 8   Period Weeks   Status New   OT LONG TERM GOAL #3   Title Pt will be able to write a simple phrase with at least 90% legibility at least 2/3 trials   Time 8   Period Weeks   Status New   OT LONG TERM GOAL #4   Title Pt will be able to brush his hair with min A.   Time 8   Period Weeks   Status New               Plan - 04/27/16 1520    Clinical Impression Statement Pt is a 70 y.o. male with diagnosis of Parkinsonism, with hx of glaucoma, hypothyroidism and  s/p Laminectomy/Foraminotomy 06/06/15. Pt is familiar to this therapist .Pt presents with a decline in function since last discharged from occupational therapy. Pt presents today with decr balance/functional mobility for ADLs,fall risk, decr UE functional use and ADL performance, decr coordination, difficulty with timing of movement, hypokinesia, rigidity, decr safety, and cognitive deficits. Pt would benefit from occupational therapy to maximize safety and independence with ADLs/IADLs and to maintain quality of life.   Rehab Potential Fair   OT Frequency 2x / week  plus eval   OT Duration 8 weeks   OT  Treatment/Interventions Self-care/ADL training;Therapeutic exercise;Functional Mobility Training;Patient/family education;Balance training;Splinting;Manual Therapy;Neuromuscular education;Ultrasound;Energy conservation;Therapeutic exercises;Therapeutic activities;DME and/or AE instruction;Parrafin;Cryotherapy;Electrical Stimulation;Fluidtherapy;Cognitive remediation/compensation;Moist Heat;Contrast Bath;Passive range of motion   Plan inital HEP   Consulted and Agree with Plan of Care Patient;Family member/caregiver   Family Member Consulted wife      Patient will benefit from skilled therapeutic intervention in order to improve the following deficits and impairments:  Abnormal gait, Decreased coordination, Decreased range of motion, Difficulty walking, Impaired flexibility, Impaired sensation, Increased edema, Decreased safety awareness, Decreased endurance, Decreased activity tolerance, Decreased knowledge of precautions, Impaired tone, Pain, Impaired UE functional use, Decreased knowledge of use of DME, Decreased balance, Decreased cognition, Decreased mobility, Decreased strength, Impaired perceived functional ability  Visit Diagnosis: Other lack of coordination - Plan: Ot plan of care cert/re-cert  Unsteadiness on feet - Plan: Ot plan of care cert/re-cert  Other symptoms and signs involving the musculoskeletal system - Plan: Ot plan of care cert/re-cert  Other symptoms and signs involving the nervous system - Plan: Ot plan of care cert/re-cert  Attention and concentration deficit - Plan: Ot plan of care cert/re-cert      G-Codes - [id number]    Functional Assessment Tool Used 3 button/ unbutton 52.79,  clinical impressions   Functional Limitation Self care   Self Care Current Status [id number]) At least 40 percent but less than 60 percent impaired, limited or restricted   Self Care Goal Status [id number]) At least 20 percent but less than 40 percent impaired, limited or restricted       Problem List Patient Active Problem List   Diagnosis Date Noted  .  Hx of adenomatous colonic polyps 01/23/2016  . Edema extremities 01/13/2016  . Lumbar stenosis 06/06/2015  . HOCM (hypertrophic obstructive cardiomyopathy) ([place]) 12/12/2014  . Infection of right prepatellar bursa 10/18/2014  . DOE (dyspnea on exertion) 09/06/2014  . Fatigue due to excessive exertion 09/06/2014  . Testosterone deficiency 11/12/2013  . Routine general medical examination at a health care facility 08/30/2013  . Mixed hyperlipidemia 08/30/2013  . Other abnormal glucose 08/30/2013  . 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 Parkinsonism ([place]) 01/16/2013  . INSOMNIA 06/06/2008  . GLAUCOMA 06/01/2008  . Essential hypertension 06/01/2008  . Seasonal and perennial allergic rhinitis 06/01/2008    RINE,KATHRYN 04/27/2016, 4:08 PM  [facility] [address] [place], [place], [address] Phone: [phone]   Fax:  [phone]  Name: Sohrab Vannelli MRN: [id number] Date of Birth: 1946-07-14

## 2016-04-30 ENCOUNTER — Other Ambulatory Visit: Payer: Self-pay | Admitting: Internal Medicine

## 2016-04-30 ENCOUNTER — Ambulatory Visit (INDEPENDENT_AMBULATORY_CARE_PROVIDER_SITE_OTHER): Payer: Medicare Other | Admitting: Neurology

## 2016-04-30 ENCOUNTER — Encounter: Payer: Self-pay | Admitting: Neurology

## 2016-04-30 VITALS — BP 138/82 | HR 78 | Resp 16 | Ht 70.0 in | Wt 204.0 lb

## 2016-04-30 DIAGNOSIS — R296 Repeated falls: Secondary | ICD-10-CM

## 2016-04-30 DIAGNOSIS — F482 Pseudobulbar affect: Secondary | ICD-10-CM

## 2016-04-30 DIAGNOSIS — G20C Parkinsonism, unspecified: Secondary | ICD-10-CM

## 2016-04-30 DIAGNOSIS — Z9181 History of falling: Secondary | ICD-10-CM

## 2016-04-30 DIAGNOSIS — G20 Parkinson's disease: Secondary | ICD-10-CM

## 2016-04-30 NOTE — Patient Instructions (Signed)
I will refer you to [facility] neurology, Movement disorders clinic, requesting evaluation and treatment, especially any research trials they may have.  I will see you back in 6 months.  We will continue with outpatient therapies as planned.
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
## 2016-05-23 ENCOUNTER — Ambulatory Visit: Payer: Medicare Other | Admitting: Occupational Therapy

## 2016-05-23 ENCOUNTER — Ambulatory Visit: Payer: Medicare Other

## 2016-05-23 ENCOUNTER — Ambulatory Visit: Payer: Medicare Other | Attending: Neurology | Admitting: Physical Therapy

## 2016-05-23 DIAGNOSIS — R29818 Other symptoms and signs involving the nervous system: Secondary | ICD-10-CM

## 2016-05-23 DIAGNOSIS — R41840 Attention and concentration deficit: Secondary | ICD-10-CM

## 2016-05-23 DIAGNOSIS — R29898 Other symptoms and signs involving the musculoskeletal system: Secondary | ICD-10-CM

## 2016-05-23 DIAGNOSIS — R2681 Unsteadiness on feet: Secondary | ICD-10-CM

## 2016-05-23 DIAGNOSIS — M6281 Muscle weakness (generalized): Secondary | ICD-10-CM | POA: Diagnosis not present

## 2016-05-23 DIAGNOSIS — R41841 Cognitive communication deficit: Secondary | ICD-10-CM | POA: Diagnosis not present

## 2016-05-23 DIAGNOSIS — R471 Dysarthria and anarthria: Secondary | ICD-10-CM | POA: Diagnosis not present

## 2016-05-23 DIAGNOSIS — R1312 Dysphagia, oropharyngeal phase: Secondary | ICD-10-CM

## 2016-05-23 DIAGNOSIS — R4701 Aphasia: Secondary | ICD-10-CM

## 2016-05-23 DIAGNOSIS — R2689 Other abnormalities of gait and mobility: Secondary | ICD-10-CM

## 2016-05-23 DIAGNOSIS — R278 Other lack of coordination: Secondary | ICD-10-CM

## 2016-05-23 NOTE — Patient Instructions (Addendum)
   Signs of Aspiration Pneumonia   . Chest pain/tightness . Fever (can be low grade) . Cough  o With foul-smelling phlegm (sputum) o With sputum containing pus or blood o With greenish sputum . Fatigue  . Shortness of breath  . Wheezing   **IF YOU HAVE THESE SIGNS, CONTACT YOUR DOCTOR OR GO TO THE EMERGENCY DEPARTMENT OR URGENT CARE AS SOON AS POSSIBLE**     ==========================================================================   Please complete the assigned speech therapy homework and return it to your next session.

## 2016-05-23 NOTE — Therapy (Signed)
[facility] [address] [place], [place], [address] Phone: [phone]   Fax:  [phone]  Occupational Therapy Treatment  Patient Details  Name: [name] MRN: [id number] Date of Birth: 09/17/1946 Referring Provider: Dr. Rexene Alberts  Encounter Date: 05/23/2016      OT End of Session - 05/23/16 0910    Visit Number 2   Number of Visits 17   Date for OT Re-Evaluation 07/21/16   Authorization Type Medicare   Authorization - Visit Number 2   Authorization - Number of Visits 10   OT Start Time 0848   OT Stop Time 0928   OT Time Calculation (min) 40 min   Activity Tolerance Patient tolerated treatment well   Behavior During Therapy [facility] for tasks assessed/performed      Past Medical History:  Diagnosis Date  . Allergic rhinitis   . Allergy   . Arthritis   . At risk for falls   . Cancer ([place])    basal cell skin cancer  . Cataract   . Complication of anesthesia    Slow to awaken  . Constipation   . DDD (degenerative disc disease), cervical   . Dysphagia   . Edema extremities 01/13/2016  . GERD (gastroesophageal reflux disease)    Decdreased, has decreased coffee intack.  . Glaucoma   . HTN (hypertension)   . Hx of adenomatous colonic polyps 01/23/2016  . Hyperlipidemia   . Hypogonadism male   . Hypothyroidism   . OSA (obstructive sleep apnea)    Has had surgery x 2  . Parkinsonism ([place]) 01/16/2013   no tremors, balance issues only , speech and swallowing difficulties   . Pre-diabetes   . Sleep apnea    surgery x 2- no cpap  . Staph infection 10-2014  . Thyroid disease    HYPOTHYROID    Past Surgical History:  Procedure Laterality Date  . CATARACT EXTRACTION    . COLONOSCOPY  04-27-2004   tics, hems   . I&D EXTREMITY Right 10/18/2014   Procedure: IRRIGATION AND DEBRIDEMENT RIGHT KNEE PREPATELLA BURSA INFECTION;  Surgeon: [name], MD;  Location: [facility];  Service: Orthopedics;  Laterality: Right;   . [place] SURGERY  04-2015  . LUMBAR LAMINECTOMY WITH COFLEX 1 LEVEL N/A 06/06/2015   Procedure: Lumbar three-four Laminectomy/Foraminotomy with placement of Coflex;  Surgeon: [name], MD;  Location: [place] [facility]UR[facility];  Service: Neurosurgery;  Laterality: N/A;  . NASAL SEPTUM SURGERY    . TONSILLECTOMY     and adenoids  . UVULOPALATOPHARYNGOPLASTY      There were no vitals filed for this visit.                  Handwriting activities for digital control, min v.c., then writing with emphasis on larger slower writing   Pt was instructed in coordination HEP, see pt instructions, mod v.c./ demonstration        OT Education - 05/23/16 1314    Education provided Yes   Education Details coordination HEP with large amplitude movements   Person(s) Educated Patient   Methods Explanation;Demonstration;Verbal cues;Handout   Comprehension Verbalized understanding;Returned demonstration;Verbal cues required;Need further instruction          OT Short Term Goals - 04/27/16 1505      OT SHORT TERM GOAL #1   Title Pt will perform updated HEP with supervision/min cues.   Time 4   Period Weeks   Status New     OT  SHORT TERM GOAL #2   Title Pt will write name with at least 90% legibility at least 2/3 trials   Time 4   Period Weeks   Status New     OT SHORT TERM GOAL #3   Title Pt will report incr ease with washing hair.   Baseline Pt reports difficulty and significant freezing   Time 4   Period Weeks   Status New     OT SHORT TERM GOAL #4   Title Pt will demonstrate increased ease with dressing as evidenced by decreasing 3 button/ unbutton score to 45 secs or less   Baseline 52.79 secs   Time 4   Period Weeks   Status New           OT Long Term Goals - 04/27/16 1506      OT LONG TERM GOAL #1   Title Pt/caregiver will verbalize understanding of adaptive strategies/AE for ADLs prn.   Time 8   Period Weeks   Status New     OT LONG TERM GOAL #2    Title Pt will demonstrate improved RUE functional use as evidenced by increasing box/ blocks score to 44 blocks   Baseline 40   Time 8   Period Weeks   Status New     OT LONG TERM GOAL #3   Title Pt will be able to write a simple phrase with at least 90% legibility at least 2/3 trials   Time 8   Period Weeks   Status New     OT LONG TERM GOAL #4   Title Pt will be able to brush his hair with min A.   Time 8   Period Weeks   Status New               Plan - 05/23/16 [id number]    Clinical Impression Statement Pt is progressing towards goals for PD specific HEP. Pt had a delay in the start of his treatment due to pt/ wife's request in order to  schedule around his wife's medical appointments. Therapist to send renewal to MD to extend plan of care, goals from inital eval are still appropriate. Continue skilled OT to address bradykinesia, decreased balance, cognitve deficits, freezing episodes, rigidity, and decreased coordination which impedes performace of ADLs/ IADLs.   Rehab Potential Fair   OT Frequency 2x / week   OT Duration 8 weeks   OT Treatment/Interventions Self-care/ADL training;Therapeutic exercise;Functional Mobility Training;Patient/family education;Balance training;Splinting;Manual Therapy;Neuromuscular education;Ultrasound;Energy conservation;Therapeutic exercises;Therapeutic activities;DME and/or AE instruction;Parrafin;Cryotherapy;Electrical Stimulation;Fluidtherapy;Cognitive remediation/compensation;Moist Heat;Contrast Bath;Passive range of motion   Plan reinforce coordination HEP, adapted strategies for ADLs   Consulted and Agree with Plan of Care Patient      Patient will benefit from skilled therapeutic intervention in order to improve the following deficits and impairments:  Abnormal gait, Decreased coordination, Decreased range of motion, Difficulty walking, Impaired flexibility, Impaired sensation, Increased edema, Decreased safety awareness, Decreased endurance,  Decreased activity tolerance, Decreased knowledge of precautions, Impaired tone, Pain, Impaired UE functional use, Decreased knowledge of use of DME, Decreased balance, Decreased cognition, Decreased mobility, Decreased strength, Impaired perceived functional ability  Visit Diagnosis: Muscle weakness (generalized)  Other symptoms and signs involving the nervous system  Other lack of coordination    Problem List Patient Active Problem List   Diagnosis Date Noted  . Hx of adenomatous colonic polyps 01/23/2016  . Edema extremities 01/13/2016  . Lumbar stenosis 06/06/2015  . HOCM (hypertrophic obstructive cardiomyopathy) ([place]) 12/12/2014  . Infection of right  prepatellar bursa 10/18/2014  . DOE (dyspnea on exertion) 09/06/2014  . Fatigue due to excessive exertion 09/06/2014  . Testosterone deficiency 11/12/2013  . Routine general medical examination at a health care facility 08/30/2013  . Mixed hyperlipidemia 08/30/2013  . Other abnormal glucose 08/30/2013  . 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 Parkinsonism ([place]) 01/16/2013  . INSOMNIA 06/06/2008  . GLAUCOMA 06/01/2008  . Essential hypertension 06/01/2008  . Seasonal and perennial allergic rhinitis 06/01/2008    Merryn Thaker 05/23/2016, 1:15 PM Theone Murdoch, OTR/L [phone] Phone: [phone] 1:15 PM 05/23/16 [facility] [address] [place], [place], [address] Phone: [phone]   Fax:  [phone]  Name: [name] MRN: [id number] Date of Birth: 10-31-1945

## 2016-05-23 NOTE — Therapy (Signed)
[facility] [address] [place], [place], [address] Phone: [phone]   Fax:  [phone]  Speech Language Pathology Treatment  Patient Details  Name: [name] MRN: [id number] Date of Birth: 1946/08/29 Referring Provider: Alonza Bogus, DO  Encounter Date: 05/23/2016      End of Session - 05/23/16 1243    Visit Number 2   Number of Visits 9   Date for SLP Re-Evaluation 06/22/16   SLP Start Time 1017   SLP Stop Time  1100   SLP Time Calculation (min) 43 min   Activity Tolerance Patient tolerated treatment well      Past Medical History:  Diagnosis Date  . Allergic rhinitis   . Allergy   . Arthritis   . At risk for falls   . Cancer ([place])    basal cell skin cancer  . Cataract   . Complication of anesthesia    Slow to awaken  . Constipation   . DDD (degenerative disc disease), cervical   . Dysphagia   . Edema extremities 01/13/2016  . GERD (gastroesophageal reflux disease)    Decdreased, has decreased coffee intack.  . Glaucoma   . HTN (hypertension)   . Hx of adenomatous colonic polyps 01/23/2016  . Hyperlipidemia   . Hypogonadism male   . Hypothyroidism   . OSA (obstructive sleep apnea)    Has had surgery x 2  . Parkinsonism ([place]) 01/16/2013   no tremors, balance issues only , speech and swallowing difficulties   . Pre-diabetes   . Sleep apnea    surgery x 2- no cpap  . Staph infection 10-2014  . Thyroid disease    HYPOTHYROID    Past Surgical History:  Procedure Laterality Date  . CATARACT EXTRACTION    . COLONOSCOPY  04-27-2004   tics, hems   . I&D EXTREMITY Right 10/18/2014   Procedure: IRRIGATION AND DEBRIDEMENT RIGHT KNEE PREPATELLA BURSA INFECTION;  Surgeon: [name], MD;  Location: [facility];  Service: Orthopedics;  Laterality: Right;  . [place] SURGERY  04-2015  . LUMBAR LAMINECTOMY WITH COFLEX 1 LEVEL N/A 06/06/2015   Procedure: Lumbar three-four Laminectomy/Foraminotomy with  placement of Coflex;  Surgeon: [name], MD;  Location: [facility]UR[facility];  Service: Neurosurgery;  Laterality: N/A;  . NASAL SEPTUM SURGERY    . TONSILLECTOMY     and adenoids  . UVULOPALATOPHARYNGOPLASTY      There were no vitals filed for this visit.      Subjective Assessment - 05/23/16 1023    Subjective Pt still thinks that speaking is most bothersome to him, over swallowing.   Currently in Pain? No/denies               ADULT SLP TREATMENT - 05/23/16 1024      General Information   Behavior/Cognition Alert;Cooperative;Pleasant mood;Impulsive     Treatment Provided   Treatment provided Cognitive-Linquistic     Cognitive-Linquistic Treatment   Treatment focused on Dysarthria   Skilled Treatment Pt averaged 84dB with loud /a/ with consistent verbal and visual cues from SLP. In phrase tasks, pt had difficulty with loudness, and reduced intelligibility due to significant rushing speech. SLP decided to focus on intelligibility this session. When pt chunked phrase and sentence responses intelligibilty incr'd considerably, with occasional min cues to mildfully "chunk" his utterances. Pt and wife deny overt s/s aspiration PNA. SLP educated pt/wife to begin to think about certain situations where pt will want to be able to communicate,  and that these are situations that pt will want to have augmentative communciation for in the future.     Assessment / Recommendations / Plan   Plan Continue with current plan of care     Progression Toward Goals   Progression toward goals Progressing toward goals          SLP Education - 05/23/16 1243    Education provided Yes   Education Details compensations for speech intelligibilty - chunking, augmentative/alternative communication situations   Person(s) Educated Patient;Spouse   Methods Explanation;Demonstration;Verbal cues;Handout   Comprehension Verbalized understanding;Returned demonstration;Verbal cues required;Need further  instruction            SLP Long Term Goals - 05/23/16 1248      SLP LONG TERM GOAL #1   Title pt will demo 85% intelligibility using compensations in 5 minute conversation    Time 4   Period Weeks   Status On-going     SLP LONG TERM GOAL #2   Title pt/wife will tell SLP3 ways to improve pt's communicative effectiveness by manipulating environment for pt's benefit   Time 4   Period Weeks   Status On-going     SLP LONG TERM GOAL #3   Title pt/wife will demo understanding of 4 s/s aspiration PNA   Time 4   Period Weeks   Status On-going          Plan - 05/23/16 1244    Clinical Impression Statement Pt's dysarthria was targeted today in [place]. Pt had considerable success with incr'ing intelligibilty when "chunking" responses at the phrase/sentence level, and req'd SLP min-mod A to remember to use this technique. Pt would benefit from refresher course for skilled ST targeting incr'd speech intelligibility, and for caregiver education re: how to structure pt's comunicative intents for maximal engagement/understanding.   Speech Therapy Frequency 2x / week   Duration 4 weeks  or 8 visits   Treatment/Interventions Diet toleration management by SLP;Aspiration precaution training;SLP instruction and feedback;Compensatory strategies;Language facilitation;Patient/family education;Functional tasks;Cueing hierarchy;Internal/external aids;Environmental controls   Potential to Achieve Goals Fair   Potential Considerations Ability to learn/carryover information;Severity of impairments   Consulted and Agree with Plan of Care Patient;Family member/caregiver      Patient will benefit from skilled therapeutic intervention in order to improve the following deficits and impairments:   Dysarthria and anarthria  Aphasia  Cognitive communication deficit  Oropharyngeal dysphagia    Problem List Patient Active Problem List   Diagnosis Date Noted  . Hx of adenomatous colonic polyps  01/23/2016  . Edema extremities 01/13/2016  . Lumbar stenosis 06/06/2015  . HOCM (hypertrophic obstructive cardiomyopathy) ([place]) 12/12/2014  . Infection of right prepatellar bursa 10/18/2014  . DOE (dyspnea on exertion) 09/06/2014  . Fatigue due to excessive exertion 09/06/2014  . Testosterone deficiency 11/12/2013  . Routine general medical examination at a health care facility 08/30/2013  . Mixed hyperlipidemia 08/30/2013  . Other abnormal glucose 08/30/2013  . 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 Parkinsonism ([place]) 01/16/2013  . INSOMNIA 06/06/2008  . GLAUCOMA 06/01/2008  . Essential hypertension 06/01/2008  . Seasonal and perennial allergic rhinitis 06/01/2008    [facility] ,MS, CCC-SLP  05/23/2016, 12:50 PM  [facility] [address] [place], [place], [address] Phone: [phone]   Fax:  [phone]   Name: [name] MRN: [id number] Date of Birth: [birth year]

## 2016-05-23 NOTE — Therapy (Signed)
[facility] [address] [place], [place], [address] Phone: [phone]   Fax:  [phone]  Physical Therapy Treatment  Patient Details  Name: [name] MRN: [id number] Date of Birth: 01-26-1946 Referring Provider: Rexene Alberts  Encounter Date: 05/23/2016      PT End of Session - 05/23/16 1143    Visit Number 2   Number of Visits 17   Date for PT Re-Evaluation 06/26/16   Authorization Type Medicare/BCBS-GCODE every 10th visit   PT Start Time 0934   PT Stop Time 1015   PT Time Calculation (min) 41 min   Equipment Utilized During Treatment Gait belt   Activity Tolerance Patient tolerated treatment well   Behavior During Therapy [facility] for tasks assessed/performed      Past Medical History:  Diagnosis Date  . Allergic rhinitis   . Allergy   . Arthritis   . At risk for falls   . Cancer ([place])    basal cell skin cancer  . Cataract   . Complication of anesthesia    Slow to awaken  . Constipation   . DDD (degenerative disc disease), cervical   . Dysphagia   . Edema extremities 01/13/2016  . GERD (gastroesophageal reflux disease)    Decdreased, has decreased coffee intack.  . Glaucoma   . HTN (hypertension)   . Hx of adenomatous colonic polyps 01/23/2016  . Hyperlipidemia   . Hypogonadism male   . Hypothyroidism   . OSA (obstructive sleep apnea)    Has had surgery x 2  . Parkinsonism ([place]) 01/16/2013   no tremors, balance issues only , speech and swallowing difficulties   . Pre-diabetes   . Sleep apnea    surgery x 2- no cpap  . Staph infection 10-2014  . Thyroid disease    HYPOTHYROID    Past Surgical History:  Procedure Laterality Date  . CATARACT EXTRACTION    . COLONOSCOPY  04-27-2004   tics, hems   . I&D EXTREMITY Right 10/18/2014   Procedure: IRRIGATION AND DEBRIDEMENT RIGHT KNEE PREPATELLA BURSA INFECTION;  Surgeon: [name], MD;  Location: [facility];  Service: Orthopedics;  Laterality: Right;  .  [place] [facility] SURGERY  04-2015  . LUMBAR LAMINECTOMY WITH COFLEX 1 LEVEL N/A 06/06/2015   Procedure: Lumbar three-four Laminectomy/Foraminotomy with placement of Coflex;  Surgeon: [name], MD;  Location: [facility]UR[facility];  Service: Neurosurgery;  Laterality: N/A;  . NASAL SEPTUM SURGERY    . TONSILLECTOMY     and adenoids  . UVULOPALATOPHARYNGOPLASTY      There were no vitals filed for this visit.      Subjective Assessment - 05/23/16 0957    Subjective Pt reports 2 falls since last visit.  "I feel tight" in neck, trunk and LE's.   Patient Stated Goals Pt's goals for therapy is to improve balance and strengthening.   Currently in Pain? Yes   Pain Score --  stiff   Pain Location Neck   Pain Orientation Left       Bil LE SLR with 2# weight x 10 reps x 2 sets Bridging x 10 Bil hamstring stretch contract-relax in supine x 2 minutes Bil LE SAQ with 2# weight x 10 reps x 2 sets Bil hip adduction x 10 with 5 second hold L hip flexor stretch off edge of mat x 45 seconds Trunk rotation with LE's x 10 reps with 5 seconds to each side Single knee to chest x 20 seconds x 2 bil  Supine for PROM neck into lateral flexion and rotation then seated for same Bil hip external rotator stretch x 60 sec PROM in hooklying   Pt asking about trial of Rollator again (pt tends to ask at every session).  Discussed safety issues with Rollator and continue to recommend RW.      PT Short Term Goals - 04/27/16 1219      PT SHORT TERM GOAL #1   Title Pt will perform HEP for strengthening, balance and gait with wife's supervision.  TARGET 05/26/16   Time 4   Period Weeks   Status New     PT SHORT TERM GOAL #2   Title Pt will perform at least 8 of 10 reps of sit<>stand transfers from 18 inch surfaces with minimal UE support and no posterior lean, for improved safety and efficiency of transfers.   Time 4   Period Weeks   Status New     PT SHORT TERM GOAL #3   Title Pt will improve TUG socre to less  than or equal to 23 seconds for decreased fall risk.   Time 4   Period Weeks   Status New     PT SHORT TERM GOAL #4   Title Pt/wife will verbalize understanding of techniques to decrease festinating/freezing of gait and turns   Time 4   Period Weeks   Status New           PT Long Term Goals - 04/27/16 1224      PT LONG TERM GOAL #1   Title Pt/wife will verbalize understanding of fall prevention within the home.  TARGET 06/26/16   Time 8   Period Weeks   Status New     PT LONG TERM GOAL #2   Title Pt will improve Berg Balance score to at least 27/56 for decreased fall risk.   Time 8   Period Weeks   Status New     PT LONG TERM GOAL #3   Title Pt will ambulate at least 750 ft. in 3 minute walk test for improved gait efficiency and safety.   Time 8   Period Weeks   Status New     PT LONG TERM GOAL #4   Title Pt/wife will report at least 25% improvement in turns and in bathroom spaces for decreased fall risk.   Time 8   Status New     PT LONG TERM GOAL #5   Title Pt will verbalize plans for continued community fitness upon D/C from PT.   Time 8   Period Weeks   Status New               Plan - 05/23/16 1143    Clinical Impression Statement Pt reports decreased stiffness after session today.  Continues with falls at home.  Continue PT per POC.   Rehab Potential Good   PT Frequency 2x / week   PT Duration 8 weeks   PT Treatment/Interventions ADLs/Self Care Home Management;Therapeutic exercise;Therapeutic activities;Functional mobility training;Gait training;Balance training;Neuromuscular re-education;Patient/family education   PT Next Visit Plan LE stretching and strengthening (using theraband as able), practice transfers, including fully turning to sit as part of HEP   Consulted and Agree with Plan of Care Patient      Patient will benefit from skilled therapeutic intervention in order to improve the following deficits and impairments:  Abnormal gait,  Decreased balance, Decreased activity tolerance, Decreased range of motion, Decreased safety awareness, Decreased mobility, Decreased strength, Postural dysfunction, Impaired  flexibility  Visit Diagnosis: Other abnormalities of gait and mobility  Unsteadiness on feet  Muscle weakness (generalized)  Other symptoms and signs involving the nervous system  Other lack of coordination  Other symptoms and signs involving the musculoskeletal system     Problem List Patient Active Problem List   Diagnosis Date Noted  . Hx of adenomatous colonic polyps 01/23/2016  . Edema extremities 01/13/2016  . Lumbar stenosis 06/06/2015  . HOCM (hypertrophic obstructive cardiomyopathy) ([place]) 12/12/2014  . Infection of right prepatellar bursa 10/18/2014  . DOE (dyspnea on exertion) 09/06/2014  . Fatigue due to excessive exertion 09/06/2014  . Testosterone deficiency 11/12/2013  . Routine general medical examination at a health care facility 08/30/2013  . Mixed hyperlipidemia 08/30/2013  . Other abnormal glucose 08/30/2013  . 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 Parkinsonism ([place]) 01/16/2013  . INSOMNIA 06/06/2008  . GLAUCOMA 06/01/2008  . Essential hypertension 06/01/2008  . Seasonal and perennial allergic rhinitis 06/01/2008    Narda Bonds 05/23/2016, 11:45 AM  [facility] [address] [place], [place], [address] Phone: [phone]   Fax:  [phone]  Name: Donivon Eulberg MRN: [id number] Date of Birth: [birth year]  Narda Bonds, [place] 05/23/16 11:45 AM Phone: [phone] Fax: [phone]

## 2016-05-23 NOTE — Patient Instructions (Signed)
Coordination Exercises  Perform the following exercises for 20 minutes 1 times per day. Perform with both hand(s). Perform using big movements.  Flipping Cards: Place deck of cards on the table. Flip cards over by opening your hand big to grasp and then turn your palm up big. Deal cards: Hold 1/2 or whole deck in your hand. Use thumb to push card off top of deck with one big push. Rotate ball with fingertips: Pick up with fingers/thumb and move as much as you can with each turn/movement (clockwise and counter-clockwise). Toss ball from one hand to the other: Toss big/high. Toss ball in the air and catch with the same hand: Toss big/high. Pick up coins and place in coin bank or container: Pick up with big, intentional movements. Do not drag coin to the edge. Pick up coins and stack one at a time: Pick up with big, intentional movements. Do not drag coin to the edge. (5-10 in a stack) Pick up 5-10 coins one at a time and hold in palm. Then, move coins from palm to fingertips one at time and place in coin bank/container. Practice writing: Slow down, write big, and focus on forming each letter. Perform "Flicks"/hand stretches (PWR! Hands): Close hands then flick out your fingers with focus on opening hands, pulling wrists back, and extending elbows like you are pushing. 

## 2016-05-24 ENCOUNTER — Ambulatory Visit: Payer: Medicare Other | Admitting: Occupational Therapy

## 2016-05-24 ENCOUNTER — Ambulatory Visit: Payer: Medicare Other | Admitting: Physical Therapy

## 2016-05-24 DIAGNOSIS — R29818 Other symptoms and signs involving the nervous system: Secondary | ICD-10-CM

## 2016-05-24 DIAGNOSIS — M6281 Muscle weakness (generalized): Secondary | ICD-10-CM

## 2016-05-24 DIAGNOSIS — R2681 Unsteadiness on feet: Secondary | ICD-10-CM | POA: Diagnosis not present

## 2016-05-24 DIAGNOSIS — R278 Other lack of coordination: Secondary | ICD-10-CM

## 2016-05-24 DIAGNOSIS — R2689 Other abnormalities of gait and mobility: Secondary | ICD-10-CM

## 2016-05-24 DIAGNOSIS — R29898 Other symptoms and signs involving the musculoskeletal system: Secondary | ICD-10-CM | POA: Diagnosis not present

## 2016-05-24 NOTE — Patient Instructions (Signed)
Hamstring: Towel Stretch (Supine)    Lie on back. Loop sheet around left foot, hip and knee at 90. Straighten knee and pull foot toward body. Make sure to keep your knee straight.  Hold 30 seconds. Relax. Repeat 2 times. Do 2 times a day. Repeat with other leg.  Butterfly, Supine    Lie on back, feet together. Place red band around your knees.  Lower knees toward floor. Hold 3 seconds. Repeat 10 times per session. Do 2 sessions per day.  Copyright  VHI. All rights reserved.   Hip Flexion - Supine    Place red band around both knees. Lying on back, knees bent, feet on floor.  March alternating legs bring knee toward trunk.   Repeat 10 times on each leg. Do 2 times per day.  Copyright  VHI. All rights reserved.   HIP: Flexion / KNEE: Extension, Straight Leg Raise with band    Place band around one ankle and hold other end with your opposite foot.  Raise leg up, keeping knee straight. Perform slowly. Hold 3 seconds. Use red band.   Do 10 times on each leg twice a day.   Copyright  VHI. All rights reserved.

## 2016-05-24 NOTE — Patient Instructions (Signed)

## 2016-05-24 NOTE — Therapy (Signed)
[facility] [address] [place], [place], [address] Phone: [phone]   Fax:  [phone]  Physical Therapy Treatment  Patient Details  Name: [name] MRN: [id number] Date of Birth: 03/10/1946 Referring Provider: Rexene Alberts  Encounter Date: 05/24/2016      PT End of Session - 05/24/16 1249    Visit Number 3   Number of Visits 17   Date for PT Re-Evaluation 06/26/16   Authorization Type Medicare/BCBS-GCODE every 10th visit   PT Start Time 1017   PT Stop Time 1100   PT Time Calculation (min) 43 min   Equipment Utilized During Treatment Gait belt   Activity Tolerance Patient tolerated treatment well   Behavior During Therapy [facility] for tasks assessed/performed      Past Medical History:  Diagnosis Date  . Allergic rhinitis   . Allergy   . Arthritis   . At risk for falls   . Cancer ([place])    basal cell skin cancer  . Cataract   . Complication of anesthesia    Slow to awaken  . Constipation   . DDD (degenerative disc disease), cervical   . Dysphagia   . Edema extremities 01/13/2016  . GERD (gastroesophageal reflux disease)    Decdreased, has decreased coffee intack.  . Glaucoma   . HTN (hypertension)   . Hx of adenomatous colonic polyps 01/23/2016  . Hyperlipidemia   . Hypogonadism male   . Hypothyroidism   . OSA (obstructive sleep apnea)    Has had surgery x 2  . Parkinsonism ([place]) 01/16/2013   no tremors, balance issues only , speech and swallowing difficulties   . Pre-diabetes   . Sleep apnea    surgery x 2- no cpap  . Staph infection 10-2014  . Thyroid disease    HYPOTHYROID    Past Surgical History:  Procedure Laterality Date  . CATARACT EXTRACTION    . COLONOSCOPY  04-27-2004   tics, hems   . I&D EXTREMITY Right 10/18/2014   Procedure: IRRIGATION AND DEBRIDEMENT RIGHT KNEE PREPATELLA BURSA INFECTION;  Surgeon: [name], MD;  Location: [facility];  Service: Orthopedics;  Laterality: Right;  .  [place] SURGERY  04-2015  . LUMBAR LAMINECTOMY WITH COFLEX 1 LEVEL N/A 06/06/2015   Procedure: Lumbar three-four Laminectomy/Foraminotomy with placement of Coflex;  Surgeon: [name], MD;  Location: [facility];  Service: Neurosurgery;  Laterality: N/A;  . NASAL SEPTUM SURGERY    . TONSILLECTOMY     and adenoids  . UVULOPALATOPHARYNGOPLASTY      There were no vitals filed for this visit.      Subjective Assessment - 05/24/16 1002    Subjective Pt reports neck still stiff.  Rates as 8/10 for stiffness but no pain.   Patient Stated Goals Pt's goals for therapy is to improve balance and strengthening.   Currently in Pain? No/denies  neck stiff      Bil LE SLR with red theraband x 10 reps x 2 sets Bridging x 10 Bil hamstring stretch contract-relax in supine x 30 seconds x 2 reps Bil LE SAQ with red theraband x 10 reps x 2 sets Bil hip adduction x 10 with 5 second hold L hip flexor stretch off edge of mat x 45 seconds Bil hip abduction in hooklying with red theraband x 10 then isolated single side with red theraband x 10 each Supine for PROM neck into lateral flexion and rotation then seated for same  PT Education - 05/24/16 1248    Education provided Yes   Education Details HEP, perfoming HEP in bed due to safety concerns with getting on/off floor   Person(s) Educated Patient   Methods Explanation;Demonstration;Verbal cues;Handout   Comprehension Verbalized understanding;Returned demonstration          PT Short Term Goals - 04/27/16 1219      PT SHORT TERM GOAL #1   Title Pt will perform HEP for strengthening, balance and gait with wife's supervision.  TARGET 05/26/16   Time 4   Period Weeks   Status New     PT SHORT TERM GOAL #2   Title Pt will perform at least 8 of 10 reps of sit<>stand transfers from 18 inch surfaces with minimal UE support and no posterior lean, for improved safety and efficiency of transfers.   Time 4   Period Weeks   Status  New     PT SHORT TERM GOAL #3   Title Pt will improve TUG socre to less than or equal to 23 seconds for decreased fall risk.   Time 4   Period Weeks   Status New     PT SHORT TERM GOAL #4   Title Pt/wife will verbalize understanding of techniques to decrease festinating/freezing of gait and turns   Time 4   Period Weeks   Status New           PT Long Term Goals - 04/27/16 1224      PT LONG TERM GOAL #1   Title Pt/wife will verbalize understanding of fall prevention within the home.  TARGET 06/26/16   Time 8   Period Weeks   Status New     PT LONG TERM GOAL #2   Title Pt will improve Berg Balance score to at least 27/56 for decreased fall risk.   Time 8   Period Weeks   Status New     PT LONG TERM GOAL #3   Title Pt will ambulate at least 750 ft. in 3 minute walk test for improved gait efficiency and safety.   Time 8   Period Weeks   Status New     PT LONG TERM GOAL #4   Title Pt/wife will report at least 25% improvement in turns and in bathroom spaces for decreased fall risk.   Time 8   Status New     PT LONG TERM GOAL #5   Title Pt will verbalize plans for continued community fitness upon D/C from PT.   Time 8   Period Weeks   Status New               Plan - 05/24/16 1253    Clinical Impression Statement Pt able to return demonstration for exercises.  Reports feeling stiff but feels like exercises and stretches are helping.  Continue PT per POC.   Rehab Potential Good   PT Frequency 2x / week   PT Duration 8 weeks   PT Treatment/Interventions ADLs/Self Care Home Management;Therapeutic exercise;Therapeutic activities;Functional mobility training;Gait training;Balance training;Neuromuscular re-education;Patient/family education   PT Next Visit Plan Practice transfers, including fully turning to sit as part of HEP.  Weight shifting at counter.  Stretching and strengthening.   Consulted and Agree with Plan of Care Patient      Patient will benefit  from skilled therapeutic intervention in order to improve the following deficits and impairments:  Abnormal gait, Decreased balance, Decreased activity tolerance, Decreased range of motion, Decreased safety awareness, Decreased mobility, Decreased  strength, Postural dysfunction, Impaired flexibility  Visit Diagnosis: Muscle weakness (generalized)  Other symptoms and signs involving the nervous system  Other lack of coordination  Unsteadiness on feet  Other abnormalities of gait and mobility     Problem List Patient Active Problem List   Diagnosis Date Noted  . Hx of adenomatous colonic polyps 01/23/2016  . Edema extremities 01/13/2016  . Lumbar stenosis 06/06/2015  . HOCM (hypertrophic obstructive cardiomyopathy) ([place]) 12/12/2014  . Infection of right prepatellar bursa 10/18/2014  . DOE (dyspnea on exertion) 09/06/2014  . Fatigue due to excessive exertion 09/06/2014  . Testosterone deficiency 11/12/2013  . Routine general medical examination at a health care facility 08/30/2013  . Mixed hyperlipidemia 08/30/2013  . Other abnormal glucose 08/30/2013  . 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 Parkinsonism ([place]) 01/16/2013  . INSOMNIA 06/06/2008  . GLAUCOMA 06/01/2008  . Essential hypertension 06/01/2008  . Seasonal and perennial allergic rhinitis 06/01/2008    Narda Bonds 05/24/2016, 12:58 PM  [facility] [address] [place], [place], [address] Phone: [phone]   Fax:  [phone]  Name: [name] MRN: [id number] Date of Birth: [birth year]

## 2016-05-24 NOTE — Therapy (Signed)
[facility] [address] [place], [place], [address] Phone: [phone]   Fax:  [phone]  Occupational Therapy Treatment  Patient Details  Name: [name] MRN: [id number] Date of Birth: 1946-01-08 Referring Provider: Dr. Rexene Alberts  Encounter Date: 05/24/2016      OT End of Session - 05/24/16 1003    Visit Number 3   Number of Visits 17   Date for OT Re-Evaluation 07/21/16   Authorization Type Medicare   Authorization - Visit Number 3   Authorization - Number of Visits 10   OT Start Time 0932   OT Stop Time 1015   OT Time Calculation (min) 43 min   Activity Tolerance Patient tolerated treatment well   Behavior During Therapy [facility] for tasks assessed/performed      Past Medical History:  Diagnosis Date  . Allergic rhinitis   . Allergy   . Arthritis   . At risk for falls   . Cancer ([place])    basal cell skin cancer  . Cataract   . Complication of anesthesia    Slow to awaken  . Constipation   . DDD (degenerative disc disease), cervical   . Dysphagia   . Edema extremities 01/13/2016  . GERD (gastroesophageal reflux disease)    Decdreased, has decreased coffee intack.  . Glaucoma   . HTN (hypertension)   . Hx of adenomatous colonic polyps 01/23/2016  . Hyperlipidemia   . Hypogonadism male   . Hypothyroidism   . OSA (obstructive sleep apnea)    Has had surgery x 2  . Parkinsonism ([place]) 01/16/2013   no tremors, balance issues only , speech and swallowing difficulties   . Pre-diabetes   . Sleep apnea    surgery x 2- no cpap  . Staph infection 10-2014  . Thyroid disease    HYPOTHYROID    Past Surgical History:  Procedure Laterality Date  . CATARACT EXTRACTION    . COLONOSCOPY  04-27-2004   tics, hems   . I&D EXTREMITY Right 10/18/2014   Procedure: IRRIGATION AND DEBRIDEMENT RIGHT KNEE PREPATELLA BURSA INFECTION;  Surgeon: [name], MD;  Location: [facility];  Service: Orthopedics;  Laterality: Right;   . [place] SURGERY  04-2015  . LUMBAR LAMINECTOMY WITH COFLEX 1 LEVEL N/A 06/06/2015   Procedure: Lumbar three-four Laminectomy/Foraminotomy with placement of Coflex;  Surgeon: [name], MD;  Location: [facility];  Service: Neurosurgery;  Laterality: N/A;  . NASAL SEPTUM SURGERY    . TONSILLECTOMY     and adenoids  . UVULOPALATOPHARYNGOPLASTY      There were no vitals filed for this visit.      Subjective Assessment - 05/24/16 1238    Subjective  Denies pain   Pertinent History see Epic   Patient Stated Goals improved ADL performance, decreased stiffness   Currently in Pain? No/denies           Treatment: Handwriting activities including tracing shapes and letters followed by writing with foam grip and empahsis on larger letter size mod v.c./ mod- max difficulty,maintaining larger letter size. Pt requires repetition and cueing due to small fast movements and cognitive deficits.              PWR [facility]) - 05/24/16 1239    PWR! Up 10   PWR! Rock 10   PWR! Twist 10   PWR! Step 5   Comments mod/ max v.c. and demonstration  OT Education - 05/24/16 [phone]    Education provided Yes   Education Details PWR! hands   Person(s) Educated Patient   Methods Explanation;Demonstration;Verbal cues   Comprehension Verbalized understanding;Returned demonstration;Verbal cues required          OT Short Term Goals - 04/27/16 1505      OT SHORT TERM GOAL #1   Title Pt will perform updated HEP with supervision/min cues.   Time 4   Period Weeks   Status New     OT SHORT TERM GOAL #2   Title Pt will write name with at least 90% legibility at least 2/3 trials   Time 4   Period Weeks   Status New     OT SHORT TERM GOAL #3   Title Pt will report incr ease with washing hair.   Baseline Pt reports difficulty and significant freezing   Time 4   Period Weeks   Status New     OT SHORT TERM GOAL #4   Title Pt will demonstrate increased ease with  dressing as evidenced by decreasing 3 button/ unbutton score to 45 secs or less   Baseline 52.79 secs   Time 4   Period Weeks   Status New           OT Long Term Goals - 04/27/16 1506      OT LONG TERM GOAL #1   Title Pt/caregiver will verbalize understanding of adaptive strategies/AE for ADLs prn.   Time 8   Period Weeks   Status New     OT LONG TERM GOAL #2   Title Pt will demonstrate improved RUE functional use as evidenced by increasing box/ blocks score to 44 blocks   Baseline 40   Time 8   Period Weeks   Status New     OT LONG TERM GOAL #3   Title Pt will be able to write a simple phrase with at least 90% legibility at least 2/3 trials   Time 8   Period Weeks   Status New     OT LONG TERM GOAL #4   Title Pt will be able to brush his hair with min A.   Time 8   Period Weeks   Status New               Plan - 05/24/16 1000    Clinical Impression Statement Pt is progressing slowly towards goals for handwriting, limited by small quick movements.   Rehab Potential Fair   OT Frequency 2x / week   OT Duration 8 weeks   OT Treatment/Interventions Self-care/ADL training;Therapeutic exercise;Functional Mobility Training;Patient/family education;Balance training;Splinting;Manual Therapy;Neuromuscular education;Ultrasound;Energy conservation;Therapeutic exercises;Therapeutic activities;DME and/or AE instruction;Parrafin;Cryotherapy;Electrical Stimulation;Fluidtherapy;Cognitive remediation/compensation;Moist Heat;Contrast Bath;Passive range of motion   Plan adapted strategies for ADLs, reinforce coordination HEP/ PWR! hands   OT Home Exercise Plan issued coordination, PWR1 hands   Consulted and Agree with Plan of Care Patient      Patient will benefit from skilled therapeutic intervention in order to improve the following deficits and impairments:  Abnormal gait, Decreased coordination, Decreased range of motion, Difficulty walking, Impaired flexibility, Impaired  sensation, Increased edema, Decreased safety awareness, Decreased endurance, Decreased activity tolerance, Decreased knowledge of precautions, Impaired tone, Pain, Impaired UE functional use, Decreased knowledge of use of DME, Decreased balance, Decreased cognition, Decreased mobility, Decreased strength, Impaired perceived functional ability  Visit Diagnosis: Muscle weakness (generalized)  Other symptoms and signs involving the nervous system  Other lack of coordination    Problem List  Patient Active Problem List   Diagnosis Date Noted  . Hx of adenomatous colonic polyps 01/23/2016  . Edema extremities 01/13/2016  . Lumbar stenosis 06/06/2015  . HOCM (hypertrophic obstructive cardiomyopathy) ([place]) 12/12/2014  . Infection of right prepatellar bursa 10/18/2014  . DOE (dyspnea on exertion) 09/06/2014  . Fatigue due to excessive exertion 09/06/2014  . Testosterone deficiency 11/12/2013  . Routine general medical examination at a health care facility 08/30/2013  . Mixed hyperlipidemia 08/30/2013  . Other abnormal glucose 08/30/2013  . 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 Parkinsonism ([place]) 01/16/2013  . INSOMNIA 06/06/2008  . GLAUCOMA 06/01/2008  . Essential hypertension 06/01/2008  . Seasonal and perennial allergic rhinitis 06/01/2008    Trenace Coughlin 05/24/2016, 12:42 PM  [facility] [address] [place], [place], [address] Phone: [phone]   Fax:  [phone]  Name: Kass Weisheit MRN: [id number] Date of Birth: [birth year] Theone Murdoch, OTR/L [phone] Phone: [phone] 12:42 PM 05/24/16

## 2016-05-25 ENCOUNTER — Ambulatory Visit: Payer: Medicare Other

## 2016-05-25 DIAGNOSIS — R278 Other lack of coordination: Secondary | ICD-10-CM | POA: Diagnosis not present

## 2016-05-25 DIAGNOSIS — R29818 Other symptoms and signs involving the nervous system: Secondary | ICD-10-CM | POA: Diagnosis not present

## 2016-05-25 DIAGNOSIS — R2681 Unsteadiness on feet: Secondary | ICD-10-CM | POA: Diagnosis not present

## 2016-05-25 DIAGNOSIS — R471 Dysarthria and anarthria: Secondary | ICD-10-CM

## 2016-05-25 DIAGNOSIS — R29898 Other symptoms and signs involving the musculoskeletal system: Secondary | ICD-10-CM | POA: Diagnosis not present

## 2016-05-25 DIAGNOSIS — R1312 Dysphagia, oropharyngeal phase: Secondary | ICD-10-CM

## 2016-05-25 DIAGNOSIS — R2689 Other abnormalities of gait and mobility: Secondary | ICD-10-CM | POA: Diagnosis not present

## 2016-05-25 DIAGNOSIS — R4701 Aphasia: Secondary | ICD-10-CM

## 2016-05-25 DIAGNOSIS — M6281 Muscle weakness (generalized): Secondary | ICD-10-CM | POA: Diagnosis not present

## 2016-05-25 DIAGNOSIS — R41841 Cognitive communication deficit: Secondary | ICD-10-CM

## 2016-05-25 NOTE — Patient Instructions (Signed)
  Please complete the assigned speech therapy homework and return it to your next session.  

## 2016-05-25 NOTE — Therapy (Signed)
[facility] [address] [place], [place], [address] Phone: [phone]   Fax:  [phone]  Speech Language Pathology Treatment  Patient Details  Name: [name] MRN: [id number] Date of Birth: 1946-06-11 Referring Provider: Alonza Bogus, DO  Encounter Date: 05/25/2016      End of Session - 05/25/16 1351    Visit Number 3   Number of Visits 9   Date for SLP Re-Evaluation 06/22/16   SLP Start Time 0934   SLP Stop Time  1015   SLP Time Calculation (min) 41 min   Activity Tolerance Patient tolerated treatment well      Past Medical History:  Diagnosis Date  . Allergic rhinitis   . Allergy   . Arthritis   . At risk for falls   . Cancer ([place])    basal cell skin cancer  . Cataract   . Complication of anesthesia    Slow to awaken  . Constipation   . DDD (degenerative disc disease), cervical   . Dysphagia   . Edema extremities 01/13/2016  . GERD (gastroesophageal reflux disease)    Decdreased, has decreased coffee intack.  . Glaucoma   . HTN (hypertension)   . Hx of adenomatous colonic polyps 01/23/2016  . Hyperlipidemia   . Hypogonadism male   . Hypothyroidism   . OSA (obstructive sleep apnea)    Has had surgery x 2  . Parkinsonism ([place]) 01/16/2013   no tremors, balance issues only , speech and swallowing difficulties   . Pre-diabetes   . Sleep apnea    surgery x 2- no cpap  . Staph infection 10-2014  . Thyroid disease    HYPOTHYROID    Past Surgical History:  Procedure Laterality Date  . CATARACT EXTRACTION    . COLONOSCOPY  04-27-2004   tics, hems   . I&D EXTREMITY Right 10/18/2014   Procedure: IRRIGATION AND DEBRIDEMENT RIGHT KNEE PREPATELLA BURSA INFECTION;  Surgeon: [name], MD;  Location: [facility];  Service: Orthopedics;  Laterality: Right;  . [place] SURGERY  04-2015  . LUMBAR LAMINECTOMY WITH COFLEX 1 LEVEL N/A 06/06/2015   Procedure: Lumbar three-four Laminectomy/Foraminotomy with  placement of Coflex;  Surgeon: [name], MD;  Location: [facility];  Service: Neurosurgery;  Laterality: N/A;  . NASAL SEPTUM SURGERY    . TONSILLECTOMY     and adenoids  . UVULOPALATOPHARYNGOPLASTY      There were no vitals filed for this visit.      Subjective Assessment - 05/25/16 0945    Subjective Pt arrived with wife, asking SLP about weekend.   Patient is accompained by: Family member  wife   Currently in Pain? No/denies               ADULT SLP TREATMENT - 05/25/16 0947      General Information   Behavior/Cognition Alert;Cooperative;Pleasant mood;Impulsive     Treatment Provided   Treatment provided Cognitive-Linquistic     Cognitive-Linquistic Treatment   Treatment focused on Dysarthria   Skilled Treatment SLP facilitated pt's recalibrated conversational volume average 89dB with cues for full breath and open mouth. Pt began with scales and a "singer" voice with vibrato. 3-step sequences told to SLP with chunking 40% with usual SLP mod cues. SLP could not fade cues for remainder of session or pt's success with chunking would decr.      Assessment / Recommendations / Plan   Plan Continue with current plan of care  Progression Toward Goals   Progression toward goals Progressing toward goals              SLP Long Term Goals - 05/23/16 1248      SLP LONG TERM GOAL #1   Title pt will demo 85% intelligibility using compensations in 5 minute conversation    Time 4   Period Weeks   Status On-going     SLP LONG TERM GOAL #2   Title pt/wife will tell SLP3 ways to improve pt's communicative effectiveness by manipulating environment for pt's benefit   Time 4   Period Weeks   Status On-going     SLP LONG TERM GOAL #3   Title pt/wife will demo understanding of 4 s/s aspiration PNA   Time 4   Period Weeks   Status On-going          Plan - 05/25/16 1351    Clinical Impression Statement Pt's dysarthria cont'd to be targeted today in ST. Pt  had incr'ing intelligibilty when "chunking" responses at the phrase/sentence level, and req'd SLP A routinely to remember to use this technique. Pt would benefit from refresher course for skilled ST targeting incr'd speech intelligibility, and for caregiver education re: how to structure pt's comunicative intents for maximal engagement/understanding.   Speech Therapy Frequency 2x / week   Duration 4 weeks  or 8 visits   Treatment/Interventions Diet toleration management by SLP;Aspiration precaution training;SLP instruction and feedback;Compensatory strategies;Language facilitation;Patient/family education;Functional tasks;Cueing hierarchy;Internal/external aids;Environmental controls   Potential to Achieve Goals Fair   Potential Considerations Ability to learn/carryover information;Severity of impairments   Consulted and Agree with Plan of Care Patient;Family member/caregiver      Patient will benefit from skilled therapeutic intervention in order to improve the following deficits and impairments:   Aphasia  Dysarthria and anarthria  Cognitive communication deficit  Oropharyngeal dysphagia    Problem List Patient Active Problem List   Diagnosis Date Noted  . Hx of adenomatous colonic polyps 01/23/2016  . Edema extremities 01/13/2016  . Lumbar stenosis 06/06/2015  . HOCM (hypertrophic obstructive cardiomyopathy) ([place]) 12/12/2014  . Infection of right prepatellar bursa 10/18/2014  . DOE (dyspnea on exertion) 09/06/2014  . Fatigue due to excessive exertion 09/06/2014  . Testosterone deficiency 11/12/2013  . Routine general medical examination at a health care facility 08/30/2013  . Mixed hyperlipidemia 08/30/2013  . Other abnormal glucose 08/30/2013  . 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 Parkinsonism ([place]) 01/16/2013  . INSOMNIA 06/06/2008  . GLAUCOMA 06/01/2008  . Essential hypertension 06/01/2008  . Seasonal and perennial allergic rhinitis 06/01/2008    [facility]  ,MS, CCC-SLP  05/25/2016, 1:53 PM  [facility] [address] [place], [place], [address] Phone: [phone]   Fax:  [phone]   Name: Michaeel Furnish MRN: [id number] Date of Birth: 02/14/1946

## 2016-05-29 ENCOUNTER — Ambulatory Visit: Payer: Medicare Other | Admitting: Occupational Therapy

## 2016-05-29 ENCOUNTER — Ambulatory Visit: Payer: Medicare Other | Admitting: Physical Therapy

## 2016-05-29 DIAGNOSIS — R29818 Other symptoms and signs involving the nervous system: Secondary | ICD-10-CM

## 2016-05-29 DIAGNOSIS — M6281 Muscle weakness (generalized): Secondary | ICD-10-CM | POA: Diagnosis not present

## 2016-05-29 DIAGNOSIS — R278 Other lack of coordination: Secondary | ICD-10-CM | POA: Diagnosis not present

## 2016-05-29 DIAGNOSIS — R29898 Other symptoms and signs involving the musculoskeletal system: Secondary | ICD-10-CM | POA: Diagnosis not present

## 2016-05-29 DIAGNOSIS — R2689 Other abnormalities of gait and mobility: Secondary | ICD-10-CM | POA: Diagnosis not present

## 2016-05-29 DIAGNOSIS — R2681 Unsteadiness on feet: Secondary | ICD-10-CM

## 2016-05-29 DIAGNOSIS — R41840 Attention and concentration deficit: Secondary | ICD-10-CM

## 2016-05-29 NOTE — Patient Instructions (Signed)
Use metronome for practice of walking through doorways:  Have metronome set at 72 bpm and walk slowly through the doorway, to the beat of the metronome.  If you want to use the metronome for walking, 80 bpm is a good setting.

## 2016-05-29 NOTE — Patient Instructions (Addendum)
Keeping Thinking Skills Sharp: 1. Jigsaw puzzles 2. Card/board games 3. Talking on the phone/social events 4. [email or web address] 5. Online games 6. Word serches/crossword puzzles 7.  Logic puzzles 8. Aerobic exercise, walking 9. Eating balanced diet (fruits & veggies) 10. Drink water 11. Try something new--new recipe, hobby 12. Crafts 13. Do a variety of activities that are challenging 14. Add cognitive activities while seated to writing activities(think of animal/food/city with each letter of the alphabet, counting backwards, thinking of as many vegetables as you can, etc.).--Only do this  If safe (no freezing/falls).

## 2016-05-29 NOTE — Therapy (Signed)
[facility] [address] [place], [place], [address] Phone: [phone]   Fax:  [phone]  Occupational Therapy Treatment  Patient Details  Name: [name] MRN: [id number] Date of Birth: 08-Jun-1946 Referring Provider: Dr. Rexene Alberts  Encounter Date: 05/29/2016      OT End of Session - 05/29/16 1724    Visit Number 4   Number of Visits 17   Date for OT Re-Evaluation 07/21/16   Authorization Type Medicare   Authorization - Visit Number 4   Authorization - Number of Visits 10   OT Start Time 1020   OT Stop Time 1100   OT Time Calculation (min) 40 min   Activity Tolerance Patient tolerated treatment well   Behavior During Therapy [facility] for tasks assessed/performed      Past Medical History:  Diagnosis Date  . Allergic rhinitis   . Allergy   . Arthritis   . At risk for falls   . Cancer ([place])    basal cell skin cancer  . Cataract   . Complication of anesthesia    Slow to awaken  . Constipation   . DDD (degenerative disc disease), cervical   . Dysphagia   . Edema extremities 01/13/2016  . GERD (gastroesophageal reflux disease)    Decdreased, has decreased coffee intack.  . Glaucoma   . HTN (hypertension)   . Hx of adenomatous colonic polyps 01/23/2016  . Hyperlipidemia   . Hypogonadism male   . Hypothyroidism   . OSA (obstructive sleep apnea)    Has had surgery x 2  . Parkinsonism ([place]) 01/16/2013   no tremors, balance issues only , speech and swallowing difficulties   . Pre-diabetes   . Sleep apnea    surgery x 2- no cpap  . Staph infection 10-2014  . Thyroid disease    HYPOTHYROID    Past Surgical History:  Procedure Laterality Date  . CATARACT EXTRACTION    . COLONOSCOPY  04-27-2004   tics, hems   . I&D EXTREMITY Right 10/18/2014   Procedure: IRRIGATION AND DEBRIDEMENT RIGHT KNEE PREPATELLA BURSA INFECTION;  Surgeon: [name], MD;  Location: [facility];  Service: Orthopedics;  Laterality: Right;   . [place] SURGERY  04-2015  . LUMBAR LAMINECTOMY WITH COFLEX 1 LEVEL N/A 06/06/2015   Procedure: Lumbar three-four Laminectomy/Foraminotomy with placement of Coflex;  Surgeon: [name], MD;  Location: [place];  Service: Neurosurgery;  Laterality: N/A;  . NASAL SEPTUM SURGERY    . TONSILLECTOMY     and adenoids  . UVULOPALATOPHARYNGOPLASTY      There were no vitals filed for this visit.      Subjective Assessment - 05/29/16 1724    Subjective  Denies pain   Pertinent History see Epic   Patient Stated Goals improved ADL performance, decreased stiffness   Currently in Pain? No/denies                Writing foods for each letter of alphabet, mod/ max difficulty with handwriting, micrographia at end of words, mod v.c.         [facility]) - 05/29/16 1727    PWR! Rock x10   PWR! Twist x10   Comments mod v.c and demonstration,  encouraged pt to use these strategies for moving in bed.             OT Education - 05/29/16 1726    Education provided Yes   Education Details Keeping thinking skills sharp, working on  handwriting at home, strategies for cutting food with big movments   Person(s) Educated Patient;Spouse   Methods Demonstration;Explanation;Verbal cues;Handout   Comprehension Verbalized understanding;Returned demonstration;Verbal cues required          OT Short Term Goals - 04/27/16 1505      OT SHORT TERM GOAL #1   Title Pt will perform updated HEP with supervision/min cues.   Time 4   Period Weeks   Status New     OT SHORT TERM GOAL #2   Title Pt will write name with at least 90% legibility at least 2/3 trials   Time 4   Period Weeks   Status New     OT SHORT TERM GOAL #3   Title Pt will report incr ease with washing hair.   Baseline Pt reports difficulty and significant freezing   Time 4   Period Weeks   Status New     OT SHORT TERM GOAL #4   Title Pt will demonstrate increased ease with dressing as evidenced by  decreasing 3 button/ unbutton score to 45 secs or less   Baseline 52.79 secs   Time 4   Period Weeks   Status New           OT Long Term Goals - 04/27/16 1506      OT LONG TERM GOAL #1   Title Pt/caregiver will verbalize understanding of adaptive strategies/AE for ADLs prn.   Time 8   Period Weeks   Status New     OT LONG TERM GOAL #2   Title Pt will demonstrate improved RUE functional use as evidenced by increasing box/ blocks score to 44 blocks   Baseline 40   Time 8   Period Weeks   Status New     OT LONG TERM GOAL #3   Title Pt will be able to write a simple phrase with at least 90% legibility at least 2/3 trials   Time 8   Period Weeks   Status New     OT LONG TERM GOAL #4   Title Pt will be able to brush his hair with min A.   Time 8   Period Weeks   Status New               Plan - 05/29/16 1722    Clinical Impression Statement Pt is progressing towards goals slowly. He is limited by freezing episodes and small quick movements with functional activity   Rehab Potential Fair   OT Frequency 2x / week   OT Duration 8 weeks   OT Treatment/Interventions Self-care/ADL training;Therapeutic exercise;Functional Mobility Training;Patient/family education;Balance training;Splinting;Manual Therapy;Neuromuscular education;Ultrasound;Energy conservation;Therapeutic exercises;Therapeutic activities;DME and/or AE instruction;Parrafin;Cryotherapy;Electrical Stimulation;Fluidtherapy;Cognitive remediation/compensation;Moist Heat;Contrast Bath;Passive range of motion   Plan adapted strategies for ADLS   OT Home Exercise Plan issued coordination, PWR! hands   Consulted and Agree with Plan of Care Patient   Family Member Consulted wife      Patient will benefit from skilled therapeutic intervention in order to improve the following deficits and impairments:  Abnormal gait, Decreased coordination, Decreased range of motion, Difficulty walking, Impaired flexibility, Impaired  sensation, Increased edema, Decreased safety awareness, Decreased endurance, Decreased activity tolerance, Decreased knowledge of precautions, Impaired tone, Pain, Impaired UE functional use, Decreased knowledge of use of DME, Decreased balance, Decreased cognition, Decreased mobility, Decreased strength, Impaired perceived functional ability  Visit Diagnosis: Muscle weakness (generalized)  Other symptoms and signs involving the nervous system  Other lack of coordination  Attention and concentration deficit  Other symptoms and signs involving the musculoskeletal system    Problem List Patient Active Problem List   Diagnosis Date Noted  . Hx of adenomatous colonic polyps 01/23/2016  . Edema extremities 01/13/2016  . Lumbar stenosis 06/06/2015  . HOCM (hypertrophic obstructive cardiomyopathy) ([place]) 12/12/2014  . Infection of right prepatellar bursa 10/18/2014  . DOE (dyspnea on exertion) 09/06/2014  . Fatigue due to excessive exertion 09/06/2014  . Testosterone deficiency 11/12/2013  . Routine general medical examination at a health care facility 08/30/2013  . Mixed hyperlipidemia 08/30/2013  . Other abnormal glucose 08/30/2013  . 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 Parkinsonism ([place]) 01/16/2013  . INSOMNIA 06/06/2008  . GLAUCOMA 06/01/2008  . Essential hypertension 06/01/2008  . Seasonal and perennial allergic rhinitis 06/01/2008    Hiyab Nhem 05/29/2016, 5:29 PM  [facility] [address] [place], [place], [address] Phone: [phone]   Fax:  [phone]  Name: Day Gelvin MRN: [id number] Date of Birth: 1945-12-09

## 2016-05-29 NOTE — Therapy (Signed)
[facility] [address] [place], [place], [address] Phone: [phone]   Fax:  [phone]  Physical Therapy Treatment  Patient Details  Name: [name] MRN: [id number] Date of Birth: 09/22/1946 Referring Provider: Rexene Alberts  Encounter Date: 05/29/2016      PT End of Session - 05/29/16 1501    Visit Number 4   Number of Visits 17   Date for PT Re-Evaluation 06/26/16   Authorization Type Medicare/BCBS-GCODE every 10th visit   PT Start Time 0932   PT Stop Time 1017   PT Time Calculation (min) 45 min   Equipment Utilized During Treatment Gait belt   Activity Tolerance Patient tolerated treatment well   Behavior During Therapy [facility] for tasks assessed/performed      Past Medical History:  Diagnosis Date  . Allergic rhinitis   . Allergy   . Arthritis   . At risk for falls   . Cancer ([place])    basal cell skin cancer  . Cataract   . Complication of anesthesia    Slow to awaken  . Constipation   . DDD (degenerative disc disease), cervical   . Dysphagia   . Edema extremities 01/13/2016  . GERD (gastroesophageal reflux disease)    Decdreased, has decreased coffee intack.  . Glaucoma   . HTN (hypertension)   . Hx of adenomatous colonic polyps 01/23/2016  . Hyperlipidemia   . Hypogonadism male   . Hypothyroidism   . OSA (obstructive sleep apnea)    Has had surgery x 2  . Parkinsonism ([place]) 01/16/2013   no tremors, balance issues only , speech and swallowing difficulties   . Pre-diabetes   . Sleep apnea    surgery x 2- no cpap  . Staph infection 10-2014  . Thyroid disease    HYPOTHYROID    Past Surgical History:  Procedure Laterality Date  . CATARACT EXTRACTION    . COLONOSCOPY  04-27-2004   tics, hems   . I&D EXTREMITY Right 10/18/2014   Procedure: IRRIGATION AND DEBRIDEMENT RIGHT KNEE PREPATELLA BURSA INFECTION;  Surgeon: [name], MD;  Location: [facility];  Service: Orthopedics;  Laterality: Right;  .  [place] SURGERY  04-2015  . LUMBAR LAMINECTOMY WITH COFLEX 1 LEVEL N/A 06/06/2015   Procedure: Lumbar three-four Laminectomy/Foraminotomy with placement of Coflex;  Surgeon: [name], MD;  Location: [facility];  Service: Neurosurgery;  Laterality: N/A;  . NASAL SEPTUM SURGERY    . TONSILLECTOMY     and adenoids  . UVULOPALATOPHARYNGOPLASTY      There were no vitals filed for this visit.      Subjective Assessment - 05/29/16 0936    Subjective Wife reports near fall yesterday, with wife having to catch him.  His balance is off when he tries to stand without holding onto walker.   Patient is accompained by: Family member  wife   Patient Stated Goals Pt's goals for therapy is to improve balance and strengthening.   Currently in Pain? No/denies                         [facility] Adult PT Treatment/Exercise - 05/29/16 0939      Transfers   Transfers Sit to Stand;Stand to Sit   Sit to Stand 5: Supervision   Stand to Sit 5: Supervision;With upper extremity assist;To chair/3-in-1   Number of Reps 10 reps     Ambulation/Gait   Ambulation/Gait Yes   Ambulation/Gait Assistance  5: Supervision;4: Min guard   Ambulation/Gait Assistance Details Used metronome during gait at 80 bpm for improved gait pacing.  Utilized metronome at 68-72 bpm during negotiation of doorways and turns.  Instructed patient and wife regarding use of metronome to practice doorway negotiation.   Ambulation Distance (Feet) 400 Feet, then multiple bouts of >150 ft negotiating doorways, furniture, and obstacles   Assistive device Rolling walker   Gait Pattern Step-through pattern;Decreased step length - left;Decreased dorsiflexion - right;Right foot flat;Trunk flexed   Ambulation Surface Level;Indoor   Pre-Gait Activities Discussed and practiced throughout session, how to handle functional situations at home, including reaching for items, turning walker, and stopping to pull up pants.  Wife concerned  that patient stops during gait to do these activities without UE support, then loses balance, unable to regain and she has to reach to catch/stop him from losing balance.  Discussed and practiced these scenarios, with pt requiring max verbal cues for safety awareness.   Gait Comments Gait activities including turns (pt needs cues with turns to keep RW on the floor, to slow pace of turn and to increase amplitude of step/march to improve weightshift to turn); negotiation of cones with RW with min guard assistance and cues for negotiation of narrow spaces.  Progressed to sidestepping in narrow spaces with RW then turns around furniture with therapist min assist and verbal cues.          Self Care:      PT Education - 05/29/16 1455    Education provided Yes   Education Details Safety awareness with standing/gait activities, use of metronome for negotiating doorways   Person(s) Educated Patient;Spouse   Methods Explanation;Demonstration;Verbal cues;Handout   Comprehension Verbalized understanding;Returned demonstration;Verbal cues required;Need further instruction          PT Short Term Goals - 05/29/16 1458      PT SHORT TERM GOAL #1   Title Pt will perform HEP for strengthening, balance and gait with wife's supervision.  TARGET 05/26/16  UPDATED TARGET 06/15/16, as visits remain in [place] for STGs   Time 4   Period Weeks   Status New     PT SHORT TERM GOAL #2   Title Pt will perform at least 8 of 10 reps of sit<>stand transfers from 18 inch surfaces with minimal UE support and no posterior lean, for improved safety and efficiency of transfers.   Time 4   Period Weeks   Status New     PT SHORT TERM GOAL #3   Title Pt will improve TUG socre to less than or equal to 23 seconds for decreased fall risk.   Time 4   Period Weeks   Status New     PT SHORT TERM GOAL #4   Title Pt/wife will verbalize understanding of techniques to decrease festinating/freezing of gait and turns   Time 4    Period Weeks   Status New           PT Long Term Goals - 04/27/16 1224      PT LONG TERM GOAL #1   Title Pt/wife will verbalize understanding of fall prevention within the home.  TARGET 06/26/16   Time 8   Period Weeks   Status New     PT LONG TERM GOAL #2   Title Pt will improve Berg Balance score to at least 27/56 for decreased fall risk.   Time 8   Period Weeks   Status New     PT LONG TERM  GOAL #3   Title Pt will ambulate at least 750 ft. in 3 minute walk test for improved gait efficiency and safety.   Time 8   Period Weeks   Status New     PT LONG TERM GOAL #4   Title Pt/wife will report at least 25% improvement in turns and in bathroom spaces for decreased fall risk.   Time 8   Status New     PT LONG TERM GOAL #5   Title Pt will verbalize plans for continued community fitness upon D/C from PT.   Time 8   Period Weeks   Status New               Plan - 05/29/16 1501    Clinical Impression Statement Focused session on wife's reports of unsteadiness, LOB, near falls when standing/walking and letting go of walker to perform functional activities.  Worked on various aspects of gait and balance related to specific functional activities, and pt continues to demonstrate decreased safety awareness with standing and gait activities. STGS remain appropriate and are extended, as weeks remain in POC (this is pt's 2nd week of PT since eval)   Rehab Potential Good   PT Frequency 2x / week   PT Duration 8 weeks   PT Treatment/Interventions ADLs/Self Care Home Management;Therapeutic exercise;Therapeutic activities;Functional mobility training;Gait training;Balance training;Neuromuscular re-education;Patient/family education   PT Next Visit Plan During gait and standing and balance activities, simulate functional activities from home (getting items out of basket at RW, pulling up pants, using remote, turning walker, negotiating doorways, and standing up safely with walker in  correct place) to improve consistency of safe performance at home   Consulted and Agree with Plan of Care Patient;Family member/caregiver   Family Member Consulted wife, [name]      Patient will benefit from skilled therapeutic intervention in order to improve the following deficits and impairments:  Abnormal gait, Decreased balance, Decreased activity tolerance, Decreased range of motion, Decreased safety awareness, Decreased mobility, Decreased strength, Postural dysfunction, Impaired flexibility  Visit Diagnosis: Other abnormalities of gait and mobility  Unsteadiness on feet     Problem List Patient Active Problem List   Diagnosis Date Noted  . Hx of adenomatous colonic polyps 01/23/2016  . Edema extremities 01/13/2016  . Lumbar stenosis 06/06/2015  . HOCM (hypertrophic obstructive cardiomyopathy) ([place]) 12/12/2014  . Infection of right prepatellar bursa 10/18/2014  . DOE (dyspnea on exertion) 09/06/2014  . Fatigue due to excessive exertion 09/06/2014  . Testosterone deficiency 11/12/2013  . Routine general medical examination at a health care facility 08/30/2013  . Mixed hyperlipidemia 08/30/2013  . Other abnormal glucose 08/30/2013  . 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 Parkinsonism ([place]) 01/16/2013  . INSOMNIA 06/06/2008  . GLAUCOMA 06/01/2008  . Essential hypertension 06/01/2008  . Seasonal and perennial allergic rhinitis 06/01/2008    Jordin Dambrosio W. 05/29/2016, 3:05 PM Frazier Butt., PT [facility] [address] [place], [place], [address] Phone: [phone]   Fax:  [phone]  Name: [name] MRN: [id number] Date of Birth: November 11, 1945

## 2016-05-31 ENCOUNTER — Ambulatory Visit: Payer: Medicare Other | Admitting: Occupational Therapy

## 2016-05-31 ENCOUNTER — Ambulatory Visit: Payer: Medicare Other

## 2016-05-31 ENCOUNTER — Ambulatory Visit: Payer: Medicare Other | Admitting: Physical Therapy

## 2016-05-31 DIAGNOSIS — R29898 Other symptoms and signs involving the musculoskeletal system: Secondary | ICD-10-CM

## 2016-05-31 DIAGNOSIS — R471 Dysarthria and anarthria: Secondary | ICD-10-CM

## 2016-05-31 DIAGNOSIS — R29818 Other symptoms and signs involving the nervous system: Secondary | ICD-10-CM

## 2016-05-31 DIAGNOSIS — R2689 Other abnormalities of gait and mobility: Secondary | ICD-10-CM

## 2016-05-31 DIAGNOSIS — R41840 Attention and concentration deficit: Secondary | ICD-10-CM

## 2016-05-31 DIAGNOSIS — R1312 Dysphagia, oropharyngeal phase: Secondary | ICD-10-CM

## 2016-05-31 DIAGNOSIS — M6281 Muscle weakness (generalized): Secondary | ICD-10-CM | POA: Diagnosis not present

## 2016-05-31 DIAGNOSIS — R4701 Aphasia: Secondary | ICD-10-CM

## 2016-05-31 DIAGNOSIS — R41841 Cognitive communication deficit: Secondary | ICD-10-CM

## 2016-05-31 DIAGNOSIS — R278 Other lack of coordination: Secondary | ICD-10-CM

## 2016-05-31 DIAGNOSIS — R2681 Unsteadiness on feet: Secondary | ICD-10-CM

## 2016-05-31 NOTE — Therapy (Signed)
[facility] [address] [place], [place], [address] Phone: [phone]   Fax:  [phone]  Physical Therapy Treatment  Patient Details  Name: [name] MRN: [id number] Date of Birth: November 09, 1945 Referring Provider: Rexene Alberts  Encounter Date: 05/31/2016      PT End of Session - 05/31/16 1546    Visit Number 5   Number of Visits 17   Date for PT Re-Evaluation 06/26/16   Authorization Type Medicare/BCBS-GCODE every 10th visit   PT Start Time 1103   PT Stop Time 1147   PT Time Calculation (min) 44 min   Equipment Utilized During Treatment Gait belt   Activity Tolerance Patient tolerated treatment well   Behavior During Therapy [facility] for tasks assessed/performed      Past Medical History:  Diagnosis Date  . Allergic rhinitis   . Allergy   . Arthritis   . At risk for falls   . Cancer ([place])    basal cell skin cancer  . Cataract   . Complication of anesthesia    Slow to awaken  . Constipation   . DDD (degenerative disc disease), cervical   . Dysphagia   . Edema extremities 01/13/2016  . GERD (gastroesophageal reflux disease)    Decdreased, has decreased coffee intack.  . Glaucoma   . HTN (hypertension)   . Hx of adenomatous colonic polyps 01/23/2016  . Hyperlipidemia   . Hypogonadism male   . Hypothyroidism   . OSA (obstructive sleep apnea)    Has had surgery x 2  . Parkinsonism ([place]) 01/16/2013   no tremors, balance issues only , speech and swallowing difficulties   . Pre-diabetes   . Sleep apnea    surgery x 2- no cpap  . Staph infection 10-2014  . Thyroid disease    HYPOTHYROID    Past Surgical History:  Procedure Laterality Date  . CATARACT EXTRACTION    . COLONOSCOPY  04-27-2004   tics, hems   . I&D EXTREMITY Right 10/18/2014   Procedure: IRRIGATION AND DEBRIDEMENT RIGHT KNEE PREPATELLA BURSA INFECTION;  Surgeon: [name], MD;  Location: [facility];  Service: Orthopedics;  Laterality: Right;  .  [place] SURGERY  04-2015  . LUMBAR LAMINECTOMY WITH COFLEX 1 LEVEL N/A 06/06/2015   Procedure: Lumbar three-four Laminectomy/Foraminotomy with placement of Coflex;  Surgeon: [name], MD;  Location: [facility];  Service: Neurosurgery;  Laterality: N/A;  . NASAL SEPTUM SURGERY    . TONSILLECTOMY     and adenoids  . UVULOPALATOPHARYNGOPLASTY      There were no vitals filed for this visit.      Subjective Assessment - 05/31/16 1539    Subjective Pt and wife report fall coming out of shower last night.  Reports cut to R shin area that wife bandaged but she did not feel like it needed stitches.  PTA not able to examine due to being wrapped and pt/wife did not want to unwrap it.     Patient is accompained by: Family member  wife   Patient Stated Goals Pt's goals for therapy is to improve balance and strengthening.   Currently in Pain? No/denies                         [facility] Adult PT Treatment/Exercise - 05/31/16 0001      Transfers   Transfers Sit to Stand;Stand to Sit   Sit to Stand 5: Supervision   Stand to Sit 5:  Supervision;With upper extremity assist;To chair/3-in-1   Number of Reps 10 reps  for strengthening     Ambulation/Gait   Ambulation/Gait Yes   Ambulation/Gait Assistance 5: Supervision;4: Min assist;3: Mod assist   Ambulation Distance (Feet) 300 Feet  twice plus <100' several times simulating home environment   Assistive device Rolling walker   Gait Pattern Step-through pattern;Decreased step length - left;Decreased dorsiflexion - right;Right foot flat;Trunk flexed   Ambulation Surface Level;Indoor     Knee/Hip Exercises: Aerobic   Other Aerobic Scifit level 3.0 all 4 extremities x 7 minutes with cues for full ROM to decrease bradykinesia.       Pre-Gait Activities Discussed and practiced throughout session, how to handle functional situations at home, including reaching for items, turning walker, and stopping to pull up pants.  Wife  concerned that patient stops during gait to do these activities without UE support, then loses balance, unable to regain and she has to reach to catch/stop him from losing balance.  Discussed and practiced these scenarios, with pt requiring max verbal cues for safety awareness.    Gait Comments Gait activities including turns (pt needs cues with turns to keep RW on the floor, to slow pace of turn and to increase amplitude of step/march to improve weightshift to turn); negotiation of cones with RW with min guard assistance and cues for negotiation of narrow spaces.  Progressed to sidestepping in narrow spaces with RW then turns around furniture with therapist min assist and verbal cues.  Simulated bathroom environment and sidestepping (diagonal as pt refers to how he enters bathroom).  Simulated sit<>stand in bathroom from chair using both hands for support as pt reports sitting in chair to put pants on but wife states when he pushes up he only uses 1 UE and tips chair over to side.  Pt with a significant LOB in clinic that required mod assist to recover balance when distracted (talking to another person).  Pt with several episodes of freezing during gait and upon standing along with R foot catching.  Wife feels like he has a harder time on clinic floors and does not notice R foot catching at home.  Pt continues to turn with RW with opposite leg first causing LE's to cross.                PT Education - 05/31/16 1545    Education provided Yes   Education Details Safety awareness with all activites in home environment, keeping RW close and infront with sit<>stand as well as using both hands on chair when standing or sitting.   Person(s) Educated Patient;Spouse   Methods Explanation;Demonstration;Tactile cues;Verbal cues   Comprehension Verbalized understanding;Returned demonstration;Verbal cues required;Need further instruction          PT Short Term Goals - 05/29/16 1458      PT SHORT TERM  GOAL #1   Title Pt will perform HEP for strengthening, balance and gait with wife's supervision.  TARGET 05/26/16  UPDATED TARGET 06/15/16, as visits remain in [place] for STGs   Time 4   Period Weeks   Status New     PT SHORT TERM GOAL #2   Title Pt will perform at least 8 of 10 reps of sit<>stand transfers from 18 inch surfaces with minimal UE support and no posterior lean, for improved safety and efficiency of transfers.   Time 4   Period Weeks   Status New     PT SHORT TERM GOAL #3   Title Pt  will improve TUG socre to less than or equal to 23 seconds for decreased fall risk.   Time 4   Period Weeks   Status New     PT SHORT TERM GOAL #4   Title Pt/wife will verbalize understanding of techniques to decrease festinating/freezing of gait and turns   Time 4   Period Weeks   Status New           PT Long Term Goals - 04/27/16 1224      PT LONG TERM GOAL #1   Title Pt/wife will verbalize understanding of fall prevention within the home.  TARGET 06/26/16   Time 8   Period Weeks   Status New     PT LONG TERM GOAL #2   Title Pt will improve Berg Balance score to at least 27/56 for decreased fall risk.   Time 8   Period Weeks   Status New     PT LONG TERM GOAL #3   Title Pt will ambulate at least 750 ft. in 3 minute walk test for improved gait efficiency and safety.   Time 8   Period Weeks   Status New     PT LONG TERM GOAL #4   Title Pt/wife will report at least 25% improvement in turns and in bathroom spaces for decreased fall risk.   Time 8   Status New     PT LONG TERM GOAL #5   Title Pt will verbalize plans for continued community fitness upon D/C from PT.   Time 8   Period Weeks   Status New               Plan - 05/31/16 1546    Clinical Impression Statement Treatment focused on simulated home environment to address safety.  Wife continues to report issues in home environment and had significant LOB today in clinic with multi-tasking/distraction.  Continue  PT per POC.   Rehab Potential Good   PT Frequency 2x / week   PT Duration 8 weeks   PT Treatment/Interventions ADLs/Self Care Home Management;Therapeutic exercise;Therapeutic activities;Functional mobility training;Gait training;Balance training;Neuromuscular re-education;Patient/family education   PT Next Visit Plan During gait and standing and balance activities, simulate functional activities from home (getting items out of basket at RW, pulling up pants, using remote, turning walker, negotiating doorways, and standing up safely with walker in correct place) to improve consistency of safe performance at home   Consulted and Agree with Plan of Care Patient;Family member/caregiver   Family Member Consulted wife, [name]      Patient will benefit from skilled therapeutic intervention in order to improve the following deficits and impairments:  Abnormal gait, Decreased balance, Decreased activity tolerance, Decreased range of motion, Decreased safety awareness, Decreased mobility, Decreased strength, Postural dysfunction, Impaired flexibility  Visit Diagnosis: Muscle weakness (generalized)  Other symptoms and signs involving the nervous system  Other symptoms and signs involving the musculoskeletal system  Other abnormalities of gait and mobility  Unsteadiness on feet     Problem List Patient Active Problem List   Diagnosis Date Noted  . Hx of adenomatous colonic polyps 01/23/2016  . Edema extremities 01/13/2016  . Lumbar stenosis 06/06/2015  . HOCM (hypertrophic obstructive cardiomyopathy) ([place]) 12/12/2014  . Infection of right prepatellar bursa 10/18/2014  . DOE (dyspnea on exertion) 09/06/2014  . Fatigue due to excessive exertion 09/06/2014  . Testosterone deficiency 11/12/2013  . Routine general medical examination at a health care facility 08/30/2013  . Mixed hyperlipidemia 08/30/2013  .  Other abnormal glucose 08/30/2013  . 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  Parkinsonism ([place]) 01/16/2013  . INSOMNIA 06/06/2008  . GLAUCOMA 06/01/2008  . Essential hypertension 06/01/2008  . Seasonal and perennial allergic rhinitis 06/01/2008    Narda Bonds 05/31/2016, 3:48 PM  [facility] [address] [place], [place], [address] Phone: [phone]   Fax:  [phone]  Name: [name] MRN: [id number] Date of Birth: [birth year]  Narda Bonds, [place] 05/31/16 3:49 PM Phone: [phone] Fax: [phone]

## 2016-05-31 NOTE — Therapy (Signed)
[facility] [address] [place], [place], [address] Phone: [phone]   Fax:  [phone]  Speech Language Pathology Treatment  Patient Details  Name: [name] MRN: [id number] Date of Birth: [birth year] Referring Provider: Alonza Bogus, DO  Encounter Date: 05/31/2016      End of Session - 05/31/16 1316    Visit Number 4   Number of Visits 9   Date for SLP Re-Evaluation 06/22/16   SLP Start Time 0932   SLP Stop Time  1015   SLP Time Calculation (min) 43 min   Activity Tolerance Patient tolerated treatment well      Past Medical History:  Diagnosis Date  . Allergic rhinitis   . Allergy   . Arthritis   . At risk for falls   . Cancer ([place])    basal cell skin cancer  . Cataract   . Complication of anesthesia    Slow to awaken  . Constipation   . DDD (degenerative disc disease), cervical   . Dysphagia   . Edema extremities 01/13/2016  . GERD (gastroesophageal reflux disease)    Decdreased, has decreased coffee intack.  . Glaucoma   . HTN (hypertension)   . Hx of adenomatous colonic polyps 01/23/2016  . Hyperlipidemia   . Hypogonadism male   . Hypothyroidism   . OSA (obstructive sleep apnea)    Has had surgery x 2  . Parkinsonism ([place]) 01/16/2013   no tremors, balance issues only , speech and swallowing difficulties   . Pre-diabetes   . Sleep apnea    surgery x 2- no cpap  . Staph infection 10-2014  . Thyroid disease    HYPOTHYROID    Past Surgical History:  Procedure Laterality Date  . CATARACT EXTRACTION    . COLONOSCOPY  04-27-2004   tics, hems   . I&D EXTREMITY Right 10/18/2014   Procedure: IRRIGATION AND DEBRIDEMENT RIGHT KNEE PREPATELLA BURSA INFECTION;  Surgeon: [name], MD;  Location: [facility];  Service: Orthopedics;  Laterality: Right;  . [place] SURGERY  04-2015  . LUMBAR LAMINECTOMY WITH COFLEX 1 LEVEL N/A 06/06/2015   Procedure: Lumbar three-four Laminectomy/Foraminotomy with  placement of Coflex;  Surgeon: [name], MD;  Location: [facility];  Service: Neurosurgery;  Laterality: N/A;  . NASAL SEPTUM SURGERY    . TONSILLECTOMY     and adenoids  . UVULOPALATOPHARYNGOPLASTY      There were no vitals filed for this visit.      Subjective Assessment - 05/31/16 0937    Subjective Pt arrived with wife today.    Patient is accompained by: Family member  wife               ADULT SLP TREATMENT - 05/31/16 0938      General Information   Behavior/Cognition Alert;Cooperative;Pleasant mood;Impulsive     Treatment Provided   Treatment provided Cognitive-Linquistic     Cognitive-Linquistic Treatment   Treatment focused on Dysarthria   Skilled Treatment Pt arrived with loud intelligible speech with pleasantries4 utterances after that were approx 50% intelligible so SLP reviewed "chunking" technique with pt. In short sentence responses pt was 70% intelligible without SLP cues. Speech was sub 70dB 90% of the time. With consistent visual cues to chunk his sentences intelligbilty improved to 85-90%.      Assessment / Recommendations / Plan   Plan Continue with current plan of care     Progression Toward Goals   Progression toward goals Progressing toward  goals              SLP Long Term Goals - 05/31/16 1318      SLP LONG TERM GOAL #1   Title pt will demo 85% intelligibility using compensations in 5 minute conversation    Time 3   Period Weeks   Status On-going     SLP LONG TERM GOAL #2   Title pt/wife will tell SLP3 ways to improve pt's communicative effectiveness by manipulating environment for pt's benefit   Time 3   Period Weeks   Status On-going     SLP LONG TERM GOAL #3   Title pt/wife will demo understanding of 4 s/s aspiration PNA   Time 3   Period Weeks   Status On-going          Plan - 05/31/16 1316    Clinical Impression Statement Pt's dysarthria cont'd to be targeted today in ST. Pt intelligibilty improved when  "chunking" responses at the phrase/sentence level, and req'd SLP A routinely to remember to use this technique. Pt would cont to benefit from skilled ST targeting incr'd speech intelligibility, and for caregiver education re: how to structure pt's comunicative intents for maximal engagement/understanding.   Speech Therapy Frequency 2x / week   Duration --  3 weeks, or 5 more visits   Treatment/Interventions Diet toleration management by SLP;Aspiration precaution training;SLP instruction and feedback;Compensatory strategies;Language facilitation;Patient/family education;Functional tasks;Cueing hierarchy;Internal/external aids;Environmental controls   Potential to Achieve Goals Fair   Potential Considerations Ability to learn/carryover information;Severity of impairments   Consulted and Agree with Plan of Care Patient;Family member/caregiver      Patient will benefit from skilled therapeutic intervention in order to improve the following deficits and impairments:   Dysarthria and anarthria  Oropharyngeal dysphagia  Cognitive communication deficit  Aphasia    Problem List Patient Active Problem List   Diagnosis Date Noted  . Hx of adenomatous colonic polyps 01/23/2016  . Edema extremities 01/13/2016  . Lumbar stenosis 06/06/2015  . HOCM (hypertrophic obstructive cardiomyopathy) ([place]) 12/12/2014  . Infection of right prepatellar bursa 10/18/2014  . DOE (dyspnea on exertion) 09/06/2014  . Fatigue due to excessive exertion 09/06/2014  . Testosterone deficiency 11/12/2013  . Routine general medical examination at a health care facility 08/30/2013  . Mixed hyperlipidemia 08/30/2013  . Other abnormal glucose 08/30/2013  . 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 Parkinsonism ([place]) 01/16/2013  . INSOMNIA 06/06/2008  . GLAUCOMA 06/01/2008  . Essential hypertension 06/01/2008  . Seasonal and perennial allergic rhinitis 06/01/2008    [facility] ,MS, CCC-SLP  05/31/2016, 1:19  PM  [facility] [address] [place], [place], [address] Phone: [phone]   Fax:  [phone]   Name: [name] MRN: [id number] Date of Birth: [birth year]

## 2016-06-01 NOTE — Therapy (Signed)
[facility] [address] [place], [place], [address] Phone: [phone]   Fax:  [phone]  Occupational Therapy Treatment  Patient Details  Name: [name] MRN: [id number] Date of Birth: [birth year] Referring Provider: Dr. Rexene Alberts  Encounter Date: 05/31/2016      OT End of Session - 06/01/16 1632    OT Start Time 1020   OT Stop Time 1100   OT Time Calculation (min) 40 min   Activity Tolerance Patient tolerated treatment well   Behavior During Therapy [facility] for tasks assessed/performed      Past Medical History:  Diagnosis Date  . Allergic rhinitis   . Allergy   . Arthritis   . At risk for falls   . Cancer ([place])    basal cell skin cancer  . Cataract   . Complication of anesthesia    Slow to awaken  . Constipation   . DDD (degenerative disc disease), cervical   . Dysphagia   . Edema extremities 01/13/2016  . GERD (gastroesophageal reflux disease)    Decdreased, has decreased coffee intack.  . Glaucoma   . HTN (hypertension)   . Hx of adenomatous colonic polyps 01/23/2016  . Hyperlipidemia   . Hypogonadism male   . Hypothyroidism   . OSA (obstructive sleep apnea)    Has had surgery x 2  . Parkinsonism ([place]) 01/16/2013   no tremors, balance issues only , speech and swallowing difficulties   . Pre-diabetes   . Sleep apnea    surgery x 2- no cpap  . Staph infection 10-2014  . Thyroid disease    HYPOTHYROID    Past Surgical History:  Procedure Laterality Date  . CATARACT EXTRACTION    . COLONOSCOPY  04-27-2004   tics, hems   . I&D EXTREMITY Right 10/18/2014   Procedure: IRRIGATION AND DEBRIDEMENT RIGHT KNEE PREPATELLA BURSA INFECTION;  Surgeon: [name], MD;  Location: [facility];  Service: Orthopedics;  Laterality: Right;  . [place] SURGERY  04-2015  . LUMBAR LAMINECTOMY WITH COFLEX 1 LEVEL N/A 06/06/2015   Procedure: Lumbar three-four Laminectomy/Foraminotomy with placement of Coflex;  Surgeon:  [name], MD;  Location: [facility];  Service: Neurosurgery;  Laterality: N/A;  . NASAL SEPTUM SURGERY    . TONSILLECTOMY     and adenoids  . UVULOPALATOPHARYNGOPLASTY      There were no vitals filed for this visit.      Subjective Assessment - 05/31/16 1057    Subjective  Pt / wife report pt fell in [place] and scaped his leg   Pertinent History see Epic   Patient Stated Goals improved ADL performance, decreased stiffness   Currently in Pain? Yes   Pain Score 5    Pain Location Arm   Pain Type Acute pain   Pain Onset Yesterday   Pain Frequency Intermittent   Aggravating Factors  reaching   Pain Relieving Factors inactivity   Effect of Pain on Daily Activities pt reports it is sore from fall             Therapist discussed safety strategies  For ADLs with pt and wife as pt had a fall getting out of shower while drying himself Therapist recommends pt sits for drying off, pt and wife are in agreement. Pt practiced eating using adapted strategies with pt pausing between each spoonful, mod v.c. Flipping and dealing playing cards with pause in between each movement, mod v.c.  OT Short Term Goals - 05/31/16 1026      OT SHORT TERM GOAL #2   Title Pt will write name with at least 90% legibility at least 2/3 trials     OT SHORT TERM GOAL #3   Title Pt will report incr ease with washing hair.           OT Long Term Goals - 04/27/16 1506      OT LONG TERM GOAL #1   Title Pt/caregiver will verbalize understanding of adaptive strategies/AE for ADLs prn.   Time 8   Period Weeks   Status New     OT LONG TERM GOAL #2   Title Pt will demonstrate improved RUE functional use as evidenced by increasing box/ blocks score to 44 blocks   Baseline 40   Time 8   Period Weeks   Status New     OT LONG TERM GOAL #3   Title Pt will be able to write a simple phrase with at least 90% legibility at least 2/3 trials   Time 8   Period Weeks    Status New     OT LONG TERM GOAL #4   Title Pt will be able to brush his hair with min A.   Time 8   Period Weeks   Status New               Plan - 05/31/16 1048    Clinical Impression Statement Pt is progressing towards goals slowly limited  by impulsivity and quick movements.   Rehab Potential Fair   OT Frequency 2x / week   OT Duration 8 weeks   OT Treatment/Interventions Self-care/ADL training;Therapeutic exercise;Functional Mobility Training;Patient/family education;Balance training;Splinting;Manual Therapy;Neuromuscular education;Ultrasound;Energy conservation;Therapeutic exercises;Therapeutic activities;DME and/or AE instruction;Parrafin;Cryotherapy;Electrical Stimulation;Fluidtherapy;Cognitive remediation/compensation;Moist Heat;Contrast Bath;Passive range of motion   Plan adapted strategies for ADLs, functional reaching with elbow extension    OT Home Exercise Plan issued coordination, PWR! hands   Consulted and Agree with Plan of Care Patient;Family member/caregiver   Family Member Consulted wife      Patient will benefit from skilled therapeutic intervention in order to improve the following deficits and impairments:  Abnormal gait, Decreased coordination, Decreased range of motion, Difficulty walking, Impaired flexibility, Impaired sensation, Increased edema, Decreased safety awareness, Decreased endurance, Decreased activity tolerance, Decreased knowledge of precautions, Impaired tone, Pain, Impaired UE functional use, Decreased knowledge of use of DME, Decreased balance, Decreased cognition, Decreased mobility, Decreased strength, Impaired perceived functional ability  Visit Diagnosis: Muscle weakness (generalized)  Other symptoms and signs involving the nervous system  Other lack of coordination  Attention and concentration deficit  Other symptoms and signs involving the musculoskeletal system    Problem List Patient Active Problem List   Diagnosis Date  Noted  . Hx of adenomatous colonic polyps 01/23/2016  . Edema extremities 01/13/2016  . Lumbar stenosis 06/06/2015  . HOCM (hypertrophic obstructive cardiomyopathy) ([place]) 12/12/2014  . Infection of right prepatellar bursa 10/18/2014  . DOE (dyspnea on exertion) 09/06/2014  . Fatigue due to excessive exertion 09/06/2014  . Testosterone deficiency 11/12/2013  . Routine general medical examination at a health care facility 08/30/2013  . Mixed hyperlipidemia 08/30/2013  . Other abnormal glucose 08/30/2013  . 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 Parkinsonism ([place]) 01/16/2013  . INSOMNIA 06/06/2008  . GLAUCOMA 06/01/2008  . Essential hypertension 06/01/2008  . Seasonal and perennial allergic rhinitis 06/01/2008    Zsofia Prout 06/01/2016, 4:36 PM Theone Murdoch, OTR/L [phone] Phone: [phone] 4:37 PM  06/01/16 [facility] [address] [place], [place], [address] Phone: [phone]   Fax:  [phone]  Name: [name] MRN: [id number] Date of Birth: [birth year]

## 2016-06-05 ENCOUNTER — Ambulatory Visit: Payer: Medicare Other | Admitting: Physical Therapy

## 2016-06-05 ENCOUNTER — Ambulatory Visit: Payer: Medicare Other | Admitting: Occupational Therapy

## 2016-06-05 ENCOUNTER — Ambulatory Visit: Payer: Medicare Other

## 2016-06-05 DIAGNOSIS — R2681 Unsteadiness on feet: Secondary | ICD-10-CM | POA: Diagnosis not present

## 2016-06-05 DIAGNOSIS — R2689 Other abnormalities of gait and mobility: Secondary | ICD-10-CM

## 2016-06-05 DIAGNOSIS — R278 Other lack of coordination: Secondary | ICD-10-CM

## 2016-06-05 DIAGNOSIS — R29818 Other symptoms and signs involving the nervous system: Secondary | ICD-10-CM

## 2016-06-05 DIAGNOSIS — R41841 Cognitive communication deficit: Secondary | ICD-10-CM

## 2016-06-05 DIAGNOSIS — R29898 Other symptoms and signs involving the musculoskeletal system: Secondary | ICD-10-CM

## 2016-06-05 DIAGNOSIS — R471 Dysarthria and anarthria: Secondary | ICD-10-CM

## 2016-06-05 DIAGNOSIS — R1312 Dysphagia, oropharyngeal phase: Secondary | ICD-10-CM

## 2016-06-05 DIAGNOSIS — M6281 Muscle weakness (generalized): Secondary | ICD-10-CM | POA: Diagnosis not present

## 2016-06-05 DIAGNOSIS — R4701 Aphasia: Secondary | ICD-10-CM

## 2016-06-05 DIAGNOSIS — R41840 Attention and concentration deficit: Secondary | ICD-10-CM

## 2016-06-05 NOTE — Therapy (Addendum)
[facility] [address] [place], [place], [address] Phone: [phone]   Fax:  [phone]  Occupational Therapy Treatment  Patient Details  Name: [name] MRN: [id number] Date of Birth: Mar 26, 1946 Referring Provider: Dr. Rexene Alberts  Encounter Date: 06/05/2016      OT End of Session - 06/05/16 1019    Visit Number 6   Number of Visits 17   Date for OT Re-Evaluation 07/21/16   Authorization Type Medicare   Authorization - Visit Number 6   Authorization - Number of Visits 10   OT Start Time 1020   OT Stop Time 1100   OT Time Calculation (min) 40 min   Activity Tolerance Patient tolerated treatment well   Behavior During Therapy [facility] for tasks assessed/performed      Past Medical History:  Diagnosis Date  . Allergic rhinitis   . Allergy   . Arthritis   . At risk for falls   . Cancer ([place])    basal cell skin cancer  . Cataract   . Complication of anesthesia    Slow to awaken  . Constipation   . DDD (degenerative disc disease), cervical   . Dysphagia   . Edema extremities 01/13/2016  . GERD (gastroesophageal reflux disease)    Decdreased, has decreased coffee intack.  . Glaucoma   . HTN (hypertension)   . Hx of adenomatous colonic polyps 01/23/2016  . Hyperlipidemia   . Hypogonadism male   . Hypothyroidism   . OSA (obstructive sleep apnea)    Has had surgery x 2  . Parkinsonism ([place]) 01/16/2013   no tremors, balance issues only , speech and swallowing difficulties   . Pre-diabetes   . Sleep apnea    surgery x 2- no cpap  . Staph infection 10-2014  . Thyroid disease    HYPOTHYROID    Past Surgical History:  Procedure Laterality Date  . CATARACT EXTRACTION    . COLONOSCOPY  04-27-2004   tics, hems   . I&D EXTREMITY Right 10/18/2014   Procedure: IRRIGATION AND DEBRIDEMENT RIGHT KNEE PREPATELLA BURSA INFECTION;  Surgeon: [name], MD;  Location: [facility];  Service: Orthopedics;  Laterality: Right;   . [place] SURGERY  04-2015  . LUMBAR LAMINECTOMY WITH COFLEX 1 LEVEL N/A 06/06/2015   Procedure: Lumbar three-four Laminectomy/Foraminotomy with placement of Coflex;  Surgeon: [name], MD;  Location: [facility];  Service: Neurosurgery;  Laterality: N/A;  . NASAL SEPTUM SURGERY    . TONSILLECTOMY     and adenoids  . UVULOPALATOPHARYNGOPLASTY      There were no vitals filed for this visit.      Subjective Assessment - 06/05/16 1206    Subjective  Pt/wife reports that pt is not consistently sitting during showering/drying off.  Pt reports that it is easier to stand.  "That's just not how my mind works".  Wife reports brushing hair is better and that she notices that pt has difficulty with pulling down shirt, washing hands, and putting deodrant lid on.   Pertinent History see Epic   Patient Stated Goals improved ADL performance, decreased stiffness   Currently in Pain? No/denies   Pain Onset Yesterday       Neuro re-ed:  PWR! Hands x 10 with min-mod cues For slow, deliberate movements. (hold 10sec)   Self Care:     Continued instruction in importance of slowing down and having a positive attitude towards use of strategies instead of expecting to fail.  Also reinforced importance of sitting to wash/dry off to prevent falls due to fall risk.  Pt/wife verbalized understanding.   Eating:  Pt instructed to put utensil down and open hand after each bite to slow task down and allow incr time for big swallow.  Practiced with applesauce with mod cueing for use of swallowing techniques/eating strategy.  Then simulated.    Also recommended sticky note at table/beside platewith this cue at home.  Pt/wife verbalized understanding.  Opening/closing bottles with use of large amplitude movement strategies with mod-max cueing (verbal, demo, tactile).  Pt cued to "turn big, open hand, turn big, open hand"  Pt cued to say strategy while performing (with improved performance).  Also  recommended sticky note/sign on deodorant with this cue at home.  Pt/wife verbalized understanding.  Washing hands at sink with mod-max cueing (verbal, tactile, demo) for use of large amplitude movements (palm to fingertips) and  recommended sticky note at sink with this cue at home.  Pt/wife verbalized understanding.  Pt demo improvement with cueing (with pt saying cue) and repetition.  Dressing:   Practiced donning/doffing jacket on each arm (grasping jacket with other hand big and big push through sleeve).  Pt demo improvement with repetition and min-mod cues for large amplitude movements.                         OT Education - 06/05/16 1207    Education Details Safety and importance of sitting for bathing/drying off due to fall risk; importance of positive attitude with use of strategies and willingness to use strategies; Strategies for ADLs (washing hands, opening deodrant, pulling shirt on/down)   Person(s) Educated Patient;Spouse   Methods Explanation;Demonstration;Tactile cues;Verbal cues   Comprehension Verbalized understanding;Returned demonstration;Verbal cues required;Tactile cues required;Need further instruction  mod-max cueing, will need reinforcement          OT Short Term Goals - 06/01/16 1637      OT SHORT TERM GOAL #1   Title Pt will perform updated HEP with supervision/min cues.   Time 4   Period Weeks   Status New     OT SHORT TERM GOAL #2   Title Pt will write name with at least 90% legibility at least 2/3 trials     OT SHORT TERM GOAL #3   Title Pt will report incr ease with washing hair.     OT SHORT TERM GOAL #4   Title Pt will demonstrate increased ease with dressing as evidenced by decreasing 3 button/ unbutton score to 45 secs or less   Baseline 52.79 secs   Time 4   Period Weeks   Status New           OT Long Term Goals - 04/27/16 1506      OT LONG TERM GOAL #1   Title Pt/caregiver will verbalize understanding of  adaptive strategies/AE for ADLs prn.   Time 8   Period Weeks   Status New     OT LONG TERM GOAL #2   Title Pt will demonstrate improved RUE functional use as evidenced by increasing box/ blocks score to 44 blocks   Baseline 40   Time 8   Period Weeks   Status New     OT LONG TERM GOAL #3   Title Pt will be able to write a simple phrase with at least 90% legibility at least 2/3 trials   Time 8   Period Weeks   Status New  OT LONG TERM GOAL #4   Title Pt will be able to brush his hair with min A.   Time 8   Period Weeks   Status New               Plan - 06/05/16 1020      Clinical Impressions:  Pt demo improvement movement amplitude and ability to perform functional ADL tasks with cueing, but pt needed mod-max verbal, tactile, and verbal cueing with repetition due to cognitive deficits and decr timing.   Rehab Potential Fair   OT Frequency 2x / week   OT Duration 8 weeks   OT Treatment/Interventions Self-care/ADL training;Therapeutic exercise;Functional Mobility Training;Patient/family education;Balance training;Splinting;Manual Therapy;Neuromuscular education;Ultrasound;Energy conservation;Therapeutic exercises;Therapeutic activities;DME and/or AE instruction;Parrafin;Cryotherapy;Electrical Stimulation;Fluidtherapy;Cognitive remediation/compensation;Moist Heat;Contrast Bath;Passive range of motion   Plan continue with strategies for ADLs (washing hands, donning shirt/clothing adjustments, putting lid on/off), functional reaching with elbow extension   OT Home Exercise Plan issued coordination, PWR! hands   Consulted and Agree with Plan of Care Patient;Family member/caregiver   Family Member Consulted wife      Patient will benefit from skilled therapeutic intervention in order to improve the following deficits and impairments:  Abnormal gait, Decreased coordination, Decreased range of motion, Difficulty walking, Impaired flexibility, Impaired sensation, Increased  edema, Decreased safety awareness, Decreased endurance, Decreased activity tolerance, Decreased knowledge of precautions, Impaired tone, Pain, Impaired UE functional use, Decreased knowledge of use of DME, Decreased balance, Decreased cognition, Decreased mobility, Decreased strength, Impaired perceived functional ability  Visit Diagnosis: Other symptoms and signs involving the nervous system  Other lack of coordination  Other symptoms and signs involving the musculoskeletal system  Other abnormalities of gait and mobility  Unsteadiness on feet  Attention and concentration deficit    Problem List Patient Active Problem List   Diagnosis Date Noted  . Hx of adenomatous colonic polyps 01/23/2016  . Edema extremities 01/13/2016  . Lumbar stenosis 06/06/2015  . HOCM (hypertrophic obstructive cardiomyopathy) ([place]) 12/12/2014  . Infection of right prepatellar bursa 10/18/2014  . DOE (dyspnea on exertion) 09/06/2014  . Fatigue due to excessive exertion 09/06/2014  . Testosterone deficiency 11/12/2013  . Routine general medical examination at a health care facility 08/30/2013  . Mixed hyperlipidemia 08/30/2013  . Other abnormal glucose 08/30/2013  . 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 Parkinsonism ([place]) 01/16/2013  . INSOMNIA 06/06/2008  . GLAUCOMA 06/01/2008  . Essential hypertension 06/01/2008  . Seasonal and perennial allergic rhinitis 06/01/2008    [facility] 06/05/2016, 12:15 PM  [facility] [address] [place], [place], [address] Phone: [phone]   Fax:  [phone]  Name: [name] MRN: [id number] Date of Birth: [birth year]   Vianne Bulls, OTR/L [facility] [address] [place], [place]  [address] [phone] phone [phone] 06/05/16 12:15 PM

## 2016-06-05 NOTE — Therapy (Signed)
[facility] [address] [place], [place], [address] Phone: [phone]   Fax:  [phone]  Physical Therapy Treatment  Patient Details  Name: [name] MRN: [id number] Date of Birth: [birth year] Referring Provider: Rexene Alberts  Encounter Date: 06/05/2016      PT End of Session - 06/05/16 1550    Visit Number 6   Number of Visits 17   Date for PT Re-Evaluation 06/26/16   Authorization Type Medicare/BCBS-GCODE every 10th visit   PT Start Time 1103   PT Stop Time 1147   PT Time Calculation (min) 44 min   Equipment Utilized During Treatment Gait belt   Activity Tolerance Patient tolerated treatment well   Behavior During Therapy [facility] for tasks assessed/performed      Past Medical History:  Diagnosis Date  . Allergic rhinitis   . Allergy   . Arthritis   . At risk for falls   . Cancer ([place])    basal cell skin cancer  . Cataract   . Complication of anesthesia    Slow to awaken  . Constipation   . DDD (degenerative disc disease), cervical   . Dysphagia   . Edema extremities 01/13/2016  . GERD (gastroesophageal reflux disease)    Decdreased, has decreased coffee intack.  . Glaucoma   . HTN (hypertension)   . Hx of adenomatous colonic polyps 01/23/2016  . Hyperlipidemia   . Hypogonadism male   . Hypothyroidism   . OSA (obstructive sleep apnea)    Has had surgery x 2  . Parkinsonism ([place]) 01/16/2013   no tremors, balance issues only , speech and swallowing difficulties   . Pre-diabetes   . Sleep apnea    surgery x 2- no cpap  . Staph infection 10-2014  . Thyroid disease    HYPOTHYROID    Past Surgical History:  Procedure Laterality Date  . CATARACT EXTRACTION    . COLONOSCOPY  04-27-2004   tics, hems   . I&D EXTREMITY Right 10/18/2014   Procedure: IRRIGATION AND DEBRIDEMENT RIGHT KNEE PREPATELLA BURSA INFECTION;  Surgeon: [name], MD;  Location: [facility];  Service: Orthopedics;  Laterality: Right;  .  [place] SURGERY  04-2015  . LUMBAR LAMINECTOMY WITH COFLEX 1 LEVEL N/A 06/06/2015   Procedure: Lumbar three-four Laminectomy/Foraminotomy with placement of Coflex;  Surgeon: [name], MD;  Location: [facility];  Service: Neurosurgery;  Laterality: N/A;  . NASAL SEPTUM SURGERY    . TONSILLECTOMY     and adenoids  . UVULOPALATOPHARYNGOPLASTY      There were no vitals filed for this visit.      Subjective Assessment - 06/05/16 1540    Subjective Denies falls or changes since last visit.  Wants to work on strengthening, walking and stretching.   Patient is accompained by: Family member  wife   Patient Stated Goals Pt's goals for therapy is to improve balance and strengthening.   Currently in Pain? No/denies           [facility] Adult PT Treatment/Exercise - 06/05/16 0001      Ambulation/Gait   Ambulation/Gait Yes   Ambulation/Gait Assistance 5: Supervision;4: Min guard;4: Min assist   Ambulation Distance (Feet) 400 Feet  plus;110 x 2   Assistive device Rolling walker   Gait Pattern Step-through pattern;Decreased step length - left;Decreased dorsiflexion - right;Right foot flat;Trunk flexed   Ambulation Surface Level;Indoor   Gait Comments Gait activities including turns (pt needs cues with turns to keep RW on  the floor, to slow pace of turn and to increase amplitude of step/march to improve weightshift to turn); negotiation around obstacles in clinic kitchen with RW with min guard assistance and cues for negotiation of narrow spaces.  Progressed to sidestepping in narrow spaces with RW then turns around furniture with  min assist and verbal cues.     Exercises   Exercises Other Exercises   Other Exercises  Supine for bil hamstring stretch contract/relax x 90 seconds each;PROM R heel cord stretch x 30 sec x 2;supine for P/AAROM into lateral neck flexion bil directions x 4 with some contract/relax as well;pt reports improved neck AROM after stretching;seated scapular retraction x  10     Knee/Hip Exercises: Aerobic   Other Aerobic Scifit level 3.0-5.0 all 4 extremities x 8 minutes with cues for full ROM to decrease bradykinesia.           PT Short Term Goals - 05/29/16 1458      PT SHORT TERM GOAL #1   Title Pt will perform HEP for strengthening, balance and gait with wife's supervision.  TARGET 05/26/16  UPDATED TARGET 06/15/16, as visits remain in [place] for STGs   Time 4   Period Weeks   Status New     PT SHORT TERM GOAL #2   Title Pt will perform at least 8 of 10 reps of sit<>stand transfers from 18 inch surfaces with minimal UE support and no posterior lean, for improved safety and efficiency of transfers.   Time 4   Period Weeks   Status New     PT SHORT TERM GOAL #3   Title Pt will improve TUG socre to less than or equal to 23 seconds for decreased fall risk.   Time 4   Period Weeks   Status New     PT SHORT TERM GOAL #4   Title Pt/wife will verbalize understanding of techniques to decrease festinating/freezing of gait and turns   Time 4   Period Weeks   Status New           PT Long Term Goals - 04/27/16 1224      PT LONG TERM GOAL #1   Title Pt/wife will verbalize understanding of fall prevention within the home.  TARGET 06/26/16   Time 8   Period Weeks   Status New     PT LONG TERM GOAL #2   Title Pt will improve Berg Balance score to at least 27/56 for decreased fall risk.   Time 8   Period Weeks   Status New     PT LONG TERM GOAL #3   Title Pt will ambulate at least 750 ft. in 3 minute walk test for improved gait efficiency and safety.   Time 8   Period Weeks   Status New     PT LONG TERM GOAL #4   Title Pt/wife will report at least 25% improvement in turns and in bathroom spaces for decreased fall risk.   Time 8   Status New     PT LONG TERM GOAL #5   Title Pt will verbalize plans for continued community fitness upon D/C from PT.   Time 8   Period Weeks   Status New               Plan - 06/05/16 1550     Clinical Impression Statement Continue to address simulated home environment, strengthening and stretching.  Continue PT per POC.   Rehab Potential Good  PT Frequency 2x / week   PT Duration 8 weeks   PT Treatment/Interventions ADLs/Self Care Home Management;Therapeutic exercise;Therapeutic activities;Functional mobility training;Gait training;Balance training;Neuromuscular re-education;Patient/family education   PT Next Visit Plan LE strengthening, home safety with gait, stretching   Consulted and Agree with Plan of Care Patient;Family member/caregiver   Family Member Consulted wife, [name]      Patient will benefit from skilled therapeutic intervention in order to improve the following deficits and impairments:  Abnormal gait, Decreased balance, Decreased activity tolerance, Decreased range of motion, Decreased safety awareness, Decreased mobility, Decreased strength, Postural dysfunction, Impaired flexibility  Visit Diagnosis: Muscle weakness (generalized)  Other symptoms and signs involving the nervous system  Other lack of coordination  Other symptoms and signs involving the musculoskeletal system  Other abnormalities of gait and mobility  Unsteadiness on feet     Problem List Patient Active Problem List   Diagnosis Date Noted  . Hx of adenomatous colonic polyps 01/23/2016  . Edema extremities 01/13/2016  . Lumbar stenosis 06/06/2015  . HOCM (hypertrophic obstructive cardiomyopathy) ([place]) 12/12/2014  . Infection of right prepatellar bursa 10/18/2014  . DOE (dyspnea on exertion) 09/06/2014  . Fatigue due to excessive exertion 09/06/2014  . Testosterone deficiency 11/12/2013  . Routine general medical examination at a health care facility 08/30/2013  . Mixed hyperlipidemia 08/30/2013  . Other abnormal glucose 08/30/2013  . 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 Parkinsonism ([place]) 01/16/2013  . INSOMNIA 06/06/2008  . GLAUCOMA 06/01/2008  . Essential hypertension  06/01/2008  . Seasonal and perennial allergic rhinitis 06/01/2008    Narda Bonds 06/05/2016, 3:53 PM  [facility] [address] [place], [place], [address] Phone: [phone]   Fax:  [phone]  Name: [name] MRN: [id number] Date of Birth: [birth year]   Narda Bonds, [place] 06/05/16 3:53 PM Phone: [phone] Fax: [phone]

## 2016-06-07 ENCOUNTER — Ambulatory Visit: Payer: Medicare Other | Admitting: Physical Therapy

## 2016-06-07 ENCOUNTER — Ambulatory Visit: Payer: Medicare Other | Admitting: Speech Pathology

## 2016-06-07 ENCOUNTER — Ambulatory Visit: Payer: Medicare Other | Admitting: Occupational Therapy

## 2016-06-07 DIAGNOSIS — R278 Other lack of coordination: Secondary | ICD-10-CM | POA: Diagnosis not present

## 2016-06-07 DIAGNOSIS — R29818 Other symptoms and signs involving the nervous system: Secondary | ICD-10-CM

## 2016-06-07 DIAGNOSIS — R2689 Other abnormalities of gait and mobility: Secondary | ICD-10-CM

## 2016-06-07 DIAGNOSIS — M6281 Muscle weakness (generalized): Secondary | ICD-10-CM

## 2016-06-07 DIAGNOSIS — R29898 Other symptoms and signs involving the musculoskeletal system: Secondary | ICD-10-CM | POA: Diagnosis not present

## 2016-06-07 DIAGNOSIS — R2681 Unsteadiness on feet: Secondary | ICD-10-CM | POA: Diagnosis not present

## 2016-06-07 DIAGNOSIS — R471 Dysarthria and anarthria: Secondary | ICD-10-CM

## 2016-06-07 NOTE — Therapy (Signed)
[facility] [address] [place], [place], [address] Phone: [phone]   Fax:  [phone]  Speech Language Pathology Treatment  Patient Details  Name: [name] MRN: [id number] Date of Birth: 1946-03-05 Referring Provider: Alonza Bogus, DO  Encounter Date: 06/07/2016      End of Session - 06/07/16 1216    Visit Number 5   Number of Visits 9   Date for SLP Re-Evaluation 06/22/16   SLP Start Time 1017   SLP Stop Time  1100   SLP Time Calculation (min) 43 min   Activity Tolerance Patient tolerated treatment well      Past Medical History:  Diagnosis Date  . Allergic rhinitis   . Allergy   . Arthritis   . At risk for falls   . Cancer ([place])    basal cell skin cancer  . Cataract   . Complication of anesthesia    Slow to awaken  . Constipation   . DDD (degenerative disc disease), cervical   . Dysphagia   . Edema extremities 01/13/2016  . GERD (gastroesophageal reflux disease)    Decdreased, has decreased coffee intack.  . Glaucoma   . HTN (hypertension)   . Hx of adenomatous colonic polyps 01/23/2016  . Hyperlipidemia   . Hypogonadism male   . Hypothyroidism   . OSA (obstructive sleep apnea)    Has had surgery x 2  . Parkinsonism ([place]) 01/16/2013   no tremors, balance issues only , speech and swallowing difficulties   . Pre-diabetes   . Sleep apnea    surgery x 2- no cpap  . Staph infection 10-2014  . Thyroid disease    HYPOTHYROID    Past Surgical History:  Procedure Laterality Date  . CATARACT EXTRACTION    . COLONOSCOPY  04-27-2004   tics, hems   . I&D EXTREMITY Right 10/18/2014   Procedure: IRRIGATION AND DEBRIDEMENT RIGHT KNEE PREPATELLA BURSA INFECTION;  Surgeon: [name], MD;  Location: [facility];  Service: Orthopedics;  Laterality: Right;  . [place] SURGERY  04-2015  . LUMBAR LAMINECTOMY WITH COFLEX 1 LEVEL N/A 06/06/2015   Procedure: Lumbar three-four Laminectomy/Foraminotomy with  placement of Coflex;  Surgeon: [name], MD;  Location: [facility];  Service: Neurosurgery;  Laterality: N/A;  . NASAL SEPTUM SURGERY    . TONSILLECTOMY     and adenoids  . UVULOPALATOPHARYNGOPLASTY      There were no vitals filed for this visit.      Subjective Assessment - 06/07/16 1020    Subjective "I've been blocking no chunking"               ADULT SLP TREATMENT - 06/07/16 1020      General Information   Behavior/Cognition Alert;Cooperative;Pleasant mood;Impulsive     Treatment Provided   Treatment provided Cognitive-Linquistic     Cognitive-Linquistic Treatment   Treatment focused on Dysarthria   Skilled Treatment Facilitated chunking words to increase intellgibility with structured tasks of picture descriptions using a carrier phrase, visual cues and verbal cues frequently. Frequent verbal cues and modeling also required for loudness. In structured task, pt 85% intellgible in quient environment. Simple conversation also required frequent cues and modeling to facilitate chunking compensations.      Assessment / Recommendations / Plan   Plan Continue with current plan of care     Progression Toward Goals   Progression toward goals Progressing toward goals  SLP Long Term Goals - 06/07/16 1215      SLP LONG TERM GOAL #1   Title pt will demo 85% intelligibility using compensations in 5 minute conversation    Time 2   Period Weeks   Status On-going     SLP LONG TERM GOAL #2   Title pt/wife will tell SLP3 ways to improve pt's communicative effectiveness by manipulating environment for pt's benefit   Time 2   Period Weeks   Status On-going     SLP LONG TERM GOAL #3   Title pt/wife will demo understanding of 4 s/s aspiration PNA   Time 2   Period Weeks   Status On-going          Plan - 06/07/16 1214    Clinical Impression Statement Pt's dysarthria cont'd to be targeted today in ST. Pt intelligibilty improved when "chunking"  responses at the phrase/sentence level, and req'd SLP A routinely to remember to use this technique. Pt would cont to benefit from skilled ST targeting incr'd speech intelligibility, and for caregiver education re: how to structure pt's comunicative intents for maximal engagement/understanding.   Speech Therapy Frequency 2x / week   Treatment/Interventions Diet toleration management by SLP;Aspiration precaution training;SLP instruction and feedback;Compensatory strategies;Language facilitation;Patient/family education;Functional tasks;Cueing hierarchy;Internal/external aids;Environmental controls   Potential to Achieve Goals Fair   Potential Considerations Ability to learn/carryover information;Severity of impairments   Consulted and Agree with Plan of Care Patient;Family member/caregiver   Family Member Consulted spouse      Patient will benefit from skilled therapeutic intervention in order to improve the following deficits and impairments:   Dysarthria and anarthria    Problem List Patient Active Problem List   Diagnosis Date Noted  . Hx of adenomatous colonic polyps 01/23/2016  . Edema extremities 01/13/2016  . Lumbar stenosis 06/06/2015  . HOCM (hypertrophic obstructive cardiomyopathy) ([place]) 12/12/2014  . Infection of right prepatellar bursa 10/18/2014  . DOE (dyspnea on exertion) 09/06/2014  . Fatigue due to excessive exertion 09/06/2014  . Testosterone deficiency 11/12/2013  . Routine general medical examination at a health care facility 08/30/2013  . Mixed hyperlipidemia 08/30/2013  . Other abnormal glucose 08/30/2013  . 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 Parkinsonism ([place]) 01/16/2013  . INSOMNIA 06/06/2008  . GLAUCOMA 06/01/2008  . Essential hypertension 06/01/2008  . Seasonal and perennial allergic rhinitis 06/01/2008    [name], Annye Rusk MS, CCC-SLP 06/07/2016, 12:18 PM  [facility] [address] [place], [place], [address] Phone: [phone]   Fax:  [phone]   Name: [name] MRN: [id number] Date of Birth: Dec 03, 1945

## 2016-06-07 NOTE — Therapy (Signed)
[facility] [address] [place], [place], [address] Phone: [phone]   Fax:  [phone]  Occupational Therapy Treatment  Patient Details  Name: [name] MRN: [id number] Date of Birth: 1946/05/04 Referring Provider: Dr. Rexene Alberts  Encounter Date: 06/07/2016      OT End of Session - 06/07/16 1346    Visit Number 7   Number of Visits 17   Date for OT Re-Evaluation 07/21/16   Authorization Type Medicare   Authorization - Visit Number 7   Authorization - Number of Visits 10   OT Start Time 6962   OT Stop Time 1015   OT Time Calculation (min) 40 min   Activity Tolerance Patient tolerated treatment well   Behavior During Therapy [facility] for tasks assessed/performed      Past Medical History:  Diagnosis Date  . Allergic rhinitis   . Allergy   . Arthritis   . At risk for falls   . Cancer ([place])    basal cell skin cancer  . Cataract   . Complication of anesthesia    Slow to awaken  . Constipation   . DDD (degenerative disc disease), cervical   . Dysphagia   . Edema extremities 01/13/2016  . GERD (gastroesophageal reflux disease)    Decdreased, has decreased coffee intack.  . Glaucoma   . HTN (hypertension)   . Hx of adenomatous colonic polyps 01/23/2016  . Hyperlipidemia   . Hypogonadism male   . Hypothyroidism   . OSA (obstructive sleep apnea)    Has had surgery x 2  . Parkinsonism ([place]) 01/16/2013   no tremors, balance issues only , speech and swallowing difficulties   . Pre-diabetes   . Sleep apnea    surgery x 2- no cpap  . Staph infection 10-2014  . Thyroid disease    HYPOTHYROID    Past Surgical History:  Procedure Laterality Date  . CATARACT EXTRACTION    . COLONOSCOPY  04-27-2004   tics, hems   . I&D EXTREMITY Right 10/18/2014   Procedure: IRRIGATION AND DEBRIDEMENT RIGHT KNEE PREPATELLA BURSA INFECTION;  Surgeon: [name], MD;  Location: [facility];  Service: Orthopedics;  Laterality: Right;   . [place] SURGERY  04-2015  . LUMBAR LAMINECTOMY WITH COFLEX 1 LEVEL N/A 06/06/2015   Procedure: Lumbar three-four Laminectomy/Foraminotomy with placement of Coflex;  Surgeon: [name], MD;  Location: [place] [facility];  Service: Neurosurgery;  Laterality: N/A;  . NASAL SEPTUM SURGERY    . TONSILLECTOMY     and adenoids  . UVULOPALATOPHARYNGOPLASTY      There were no vitals filed for this visit.      Subjective Assessment - 06/07/16 0935    Subjective  Wife reports that she put sticky notes up as reminders and pt reports that things are better   Pertinent History see Epic   Patient Stated Goals improved ADL performance, decreased stiffness   Currently in Pain? No/denies   Pain Onset Yesterday      Neuro re-ed:  PWR! Hands with min-mod cues For slow, deliberate movements. (hold 10sec) in prep for functional activities and during writing.   Self Care:    Continued instruction in importance of slowing down and having a positive attitude towards use of strategies instead of expecting to fail.    Pt/wife verbalized understanding.     Opening/closing bottle with use of large amplitude movement strategies with mod cueing (verbal, demo).  Pt cued to "turn big, open hand, turn big,  open hand"   (less cueing overall today)  Washing hands at sink with mod cueing (verbal, tactile, demo) for use of large amplitude movements (palm to fingertips).  Pt/wife verbalized understanding.  Pt demo improvement with cueing (with pt saying cue) and repetition.  Dressing:   Practiced donning/doffing jacket on each arm (grasping jacket with other hand big and big push through sleeve).  Pt demo improvement with repetition and min-mod cues for large amplitude movements.   Writing:   Practiced writing name with min cueing and good legibility/size.  Attempting to write short phrases/other words with mod cueing to move forearm/hand over to decr bradykinesia and for incr writing size.  Emphasized  importance of moving hand over between words or with longer words to improve size/legibility.  Pt/wife verbalized understanding.  Functional mobility:  Ambulating with CGA within gym with min A/cueing to try to stop pt from trying to pick up paper towel from floor.  Pt did bend over despite cues.   When questioned, pt able to report that he should have sat down and had wife get it and not bent over.                             OT Short Term Goals - 06/07/16 1348      OT SHORT TERM GOAL #1   Title Pt will perform updated HEP with supervision/min cues.   Time 4   Period Weeks   Status New     OT SHORT TERM GOAL #2   Title Pt will write name with at least 90% legibility at least 2/3 trials   Status Achieved  met 06/07/16     OT SHORT TERM GOAL #3   Title Pt will report incr ease with washing hair.     OT SHORT TERM GOAL #4   Title Pt will demonstrate increased ease with dressing as evidenced by decreasing 3 button/ unbutton score to 45 secs or less   Baseline 52.79 secs   Time 4   Period Weeks   Status New           OT Long Term Goals - 04/27/16 1506      OT LONG TERM GOAL #1   Title Pt/caregiver will verbalize understanding of adaptive strategies/AE for ADLs prn.   Time 8   Period Weeks   Status New     OT LONG TERM GOAL #2   Title Pt will demonstrate improved RUE functional use as evidenced by increasing box/ blocks score to 44 blocks   Baseline 40   Time 8   Period Weeks   Status New     OT LONG TERM GOAL #3   Title Pt will be able to write a simple phrase with at least 90% legibility at least 2/3 trials   Time 8   Period Weeks   Status New     OT LONG TERM GOAL #4   Title Pt will be able to brush his hair with min A.   Time 8   Period Weeks   Status New               Plan - 06/07/16 1346    Clinical Impression Statement Pt demo improved performance of functional tasks today with cueing, repetition, and use of large  amplitude movement strategies.    Rehab Potential Fair   OT Frequency 2x / week   OT Duration 8 weeks   OT Treatment/Interventions  Self-care/ADL training;Therapeutic exercise;Functional Mobility Training;Patient/family education;Balance training;Splinting;Manual Therapy;Neuromuscular education;Ultrasound;Energy conservation;Therapeutic exercises;Therapeutic activities;DME and/or AE instruction;Parrafin;Cryotherapy;Electrical Stimulation;Fluidtherapy;Cognitive remediation/compensation;Moist Heat;Contrast Bath;Passive range of motion   Plan check STGs, continue to reinforce strategies for ADLs with repetition (wife to bring t-shirt to practice with and deodorant to practice next session)   OT Home Exercise Plan issued coordination, PWR! hands   Consulted and Agree with Plan of Care Patient;Family member/caregiver   Family Member Consulted wife      Patient will benefit from skilled therapeutic intervention in order to improve the following deficits and impairments:  Abnormal gait, Decreased coordination, Decreased range of motion, Difficulty walking, Impaired flexibility, Impaired sensation, Increased edema, Decreased safety awareness, Decreased endurance, Decreased activity tolerance, Decreased knowledge of precautions, Impaired tone, Pain, Impaired UE functional use, Decreased knowledge of use of DME, Decreased balance, Decreased cognition, Decreased mobility, Decreased strength, Impaired perceived functional ability  Visit Diagnosis: No diagnosis found.    Problem List Patient Active Problem List   Diagnosis Date Noted  . Hx of adenomatous colonic polyps 01/23/2016  . Edema extremities 01/13/2016  . Lumbar stenosis 06/06/2015  . HOCM (hypertrophic obstructive cardiomyopathy) ([place]) 12/12/2014  . Infection of right prepatellar bursa 10/18/2014  . DOE (dyspnea on exertion) 09/06/2014  . Fatigue due to excessive exertion 09/06/2014  . Testosterone deficiency 11/12/2013  . Routine general  medical examination at a health care facility 08/30/2013  . Mixed hyperlipidemia 08/30/2013  . Other abnormal glucose 08/30/2013  . 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 Parkinsonism ([place]) 01/16/2013  . INSOMNIA 06/06/2008  . GLAUCOMA 06/01/2008  . Essential hypertension 06/01/2008  . Seasonal and perennial allergic rhinitis 06/01/2008    [facility] 06/07/2016, 1:52 PM  [facility] [address] [place], [place], [address] Phone: [phone]   Fax:  [phone]  Name: [name] MRN: [id number] Date of Birth: 06-11-1946   Vianne Bulls, OTR/L [facility] [address] [place], [place]  [address] [phone] phone [phone] 06/07/16 1:52 PM

## 2016-06-07 NOTE — Therapy (Signed)
[facility] [address] [place], [place], [address] Phone: [phone]   Fax:  [phone]  Physical Therapy Treatment  Patient Details  Name: [name] MRN: [id number] Date of Birth: April 02, 1946 Referring Provider: Rexene Alberts  Encounter Date: 06/07/2016      PT End of Session - 06/07/16 0949    Visit Number 7   Number of Visits 17   Date for PT Re-Evaluation 06/26/16   Authorization Type Medicare/BCBS-GCODE every 10th visit   PT Start Time 0850   PT Stop Time 0930   PT Time Calculation (min) 40 min   Equipment Utilized During Treatment Gait belt   Activity Tolerance Patient tolerated treatment well   Behavior During Therapy [facility] for tasks assessed/performed      Past Medical History:  Diagnosis Date  . Allergic rhinitis   . Allergy   . Arthritis   . At risk for falls   . Cancer ([place])    basal cell skin cancer  . Cataract   . Complication of anesthesia    Slow to awaken  . Constipation   . DDD (degenerative disc disease), cervical   . Dysphagia   . Edema extremities 01/13/2016  . GERD (gastroesophageal reflux disease)    Decdreased, has decreased coffee intack.  . Glaucoma   . HTN (hypertension)   . Hx of adenomatous colonic polyps 01/23/2016  . Hyperlipidemia   . Hypogonadism male   . Hypothyroidism   . OSA (obstructive sleep apnea)    Has had surgery x 2  . Parkinsonism ([place]) 01/16/2013   no tremors, balance issues only , speech and swallowing difficulties   . Pre-diabetes   . Sleep apnea    surgery x 2- no cpap  . Staph infection 10-2014  . Thyroid disease    HYPOTHYROID    Past Surgical History:  Procedure Laterality Date  . CATARACT EXTRACTION    . COLONOSCOPY  04-27-2004   tics, hems   . I&D EXTREMITY Right 10/18/2014   Procedure: IRRIGATION AND DEBRIDEMENT RIGHT KNEE PREPATELLA BURSA INFECTION;  Surgeon: [name], MD;  Location: [facility];  Service: Orthopedics;  Laterality: Right;  .  [place] SURGERY  04-2015  . LUMBAR LAMINECTOMY WITH COFLEX 1 LEVEL N/A 06/06/2015   Procedure: Lumbar three-four Laminectomy/Foraminotomy with placement of Coflex;  Surgeon: [name], MD;  Location: [facility];  Service: Neurosurgery;  Laterality: N/A;  . NASAL SEPTUM SURGERY    . TONSILLECTOMY     and adenoids  . UVULOPALATOPHARYNGOPLASTY      There were no vitals filed for this visit.      Subjective Assessment - 06/07/16 0853    Subjective Reports no falls since last visit.  Want to work on stretching and strengthening, walking.   Patient is accompained by: Family member  wife   Patient Stated Goals Pt's goals for therapy is to improve balance and strengthening.   Currently in Pain? No/denies                         [facility] Adult PT Treatment/Exercise - 06/07/16 0854      Transfers   Transfers Sit to Stand;Stand to Sit  Turning to sit at walker; initiating gait at RW upon standin   Sit to Stand 5: Supervision   Stand to Sit 5: Supervision;With upper extremity assist;To chair/3-in-1   Number of Reps Other reps (comment)  multiple reps through session at mat, [employer]  Ambulation/Gait   Ambulation/Gait Yes   Ambulation/Gait Assistance 4: Min guard   Ambulation Distance (Feet) 100 Feet  x 2, then 500 ft outdoors   Assistive device Rolling walker   Gait Pattern Step-through pattern;Decreased step length - left;Decreased dorsiflexion - right;Right foot flat;Trunk flexed   Ambulation Surface Level;Indoor;Outdoor;Unlevel  sidewalks, automatic door threshold negotiation   Gait Comments Gait training with RW, with pt needing postural cues for upright posture and slowed pace, especially on ramp/incline/decline; pt requires cues for slowed pace and safe walker negotiation of automatic door thresholds (slightly raised threshold)     Exercises   Exercises Other Exercises   Other Exercises  Supine for bilat hamstring stretch contract/relax 5 x 30 seconds each.   PROM R heel cord stretch x 30 sec x 3;supine for P/AAROM into neck rotation bil directions x 3 with passive stretch.  Supine and seated neck retraction x 5 reps each, shoulder press in supine x 5, scapular retraction seated x 5.  Shoulder rolls seated x 10, for improved posture.  Seated active neck rotation to R and L 3 reps each with gentle passive over-pressure     Knee/Hip Exercises: Aerobic   Other Aerobic Scifit level 5.0 all 4 extremities x 8 minutes with cues for full ROM to decrease bradykinesia.  Cues for intensity >60 RPM                  PT Short Term Goals - 05/29/16 1458      PT SHORT TERM GOAL #1   Title Pt will perform HEP for strengthening, balance and gait with wife's supervision.  TARGET 05/26/16  UPDATED TARGET 06/15/16, as visits remain in [facility] for STGs   Time 4   Period Weeks   Status New     PT SHORT TERM GOAL #2   Title Pt will perform at least 8 of 10 reps of sit<>stand transfers from 18 inch surfaces with minimal UE support and no posterior lean, for improved safety and efficiency of transfers.   Time 4   Period Weeks   Status New     PT SHORT TERM GOAL #3   Title Pt will improve TUG socre to less than or equal to 23 seconds for decreased fall risk.   Time 4   Period Weeks   Status New     PT SHORT TERM GOAL #4   Title Pt/wife will verbalize understanding of techniques to decrease festinating/freezing of gait and turns   Time 4   Period Weeks   Status New           PT Long Term Goals - 04/27/16 1224      PT LONG TERM GOAL #1   Title Pt/wife will verbalize understanding of fall prevention within the home.  TARGET 06/26/16   Time 8   Period Weeks   Status New     PT LONG TERM GOAL #2   Title Pt will improve Berg Balance score to at least 27/56 for decreased fall risk.   Time 8   Period Weeks   Status New     PT LONG TERM GOAL #3   Title Pt will ambulate at least 750 ft. in 3 minute walk test for improved gait efficiency and safety.    Time 8   Period Weeks   Status New     PT LONG TERM GOAL #4   Title Pt/wife will report at least 25% improvement in turns and in bathroom spaces for decreased fall  risk.   Time 8   Status New     PT LONG TERM GOAL #5   Title Pt will verbalize plans for continued community fitness upon D/C from PT.   Time 8   Period Weeks   Status New               Plan - 06/07/16 0950    Clinical Impression Statement Pt appears slower and more focused with gait and transitional movements this session.  Pt will continue to benefit from further skilled PT to address gait, strengthening, functional mobility.   Rehab Potential Good   PT Frequency 2x / week   PT Duration 8 weeks   PT Treatment/Interventions ADLs/Self Care Home Management;Therapeutic exercise;Therapeutic activities;Functional mobility training;Gait training;Balance training;Neuromuscular re-education;Patient/family education   PT Next Visit Plan LE strengthening, home safety with gait, stretching-Check STGs next visit   Consulted and Agree with Plan of Care Patient;Family member/caregiver   Family Member Consulted wife, [name]      Patient will benefit from skilled therapeutic intervention in order to improve the following deficits and impairments:  Abnormal gait, Decreased balance, Decreased activity tolerance, Decreased range of motion, Decreased safety awareness, Decreased mobility, Decreased strength, Postural dysfunction, Impaired flexibility  Visit Diagnosis: Other symptoms and signs involving the nervous system  Muscle weakness (generalized)  Other abnormalities of gait and mobility     Problem List Patient Active Problem List   Diagnosis Date Noted  . Hx of adenomatous colonic polyps 01/23/2016  . Edema extremities 01/13/2016  . Lumbar stenosis 06/06/2015  . HOCM (hypertrophic obstructive cardiomyopathy) ([place]) 12/12/2014  . Infection of right prepatellar bursa 10/18/2014  . DOE (dyspnea on exertion) 09/06/2014   . Fatigue due to excessive exertion 09/06/2014  . Testosterone deficiency 11/12/2013  . Routine general medical examination at a health care facility 08/30/2013  . Mixed hyperlipidemia 08/30/2013  . Other abnormal glucose 08/30/2013  . 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 Parkinsonism ([place]) 01/16/2013  . INSOMNIA 06/06/2008  . GLAUCOMA 06/01/2008  . Essential hypertension 06/01/2008  . Seasonal and perennial allergic rhinitis 06/01/2008    Ximenna Fonseca W. 06/07/2016, 9:54 AM Frazier Butt., PT [facility] [place] [place] [address] [place], [place], [address] Phone: [phone]   Fax:  [phone]  Name: [name] MRN: [id number] Date of Birth: 12/21/1945

## 2016-06-07 NOTE — Therapy (Signed)
[facility] [address] [place], [place], [address] Phone: [phone]   Fax:  [phone]  Speech Language Pathology Treatment  Patient Details  Name: [name] MRN: [id number] Date of Birth: Jul 15, 1946 Referring Provider: Alonza Bogus, DO  Encounter Date: 06/05/2016      End of Session - 06/07/16 1216    Visit Number 5   Number of Visits 9   Date for SLP Re-Evaluation 06/22/16   SLP Start Time 0933   SLP Stop Time  1015   SLP Time Calculation (min) 43 min   Activity Tolerance Patient tolerated treatment well      Past Medical History:  Diagnosis Date  . Allergic rhinitis   . Allergy   . Arthritis   . At risk for falls   . Cancer ([place])    basal cell skin cancer  . Cataract   . Complication of anesthesia    Slow to awaken  . Constipation   . DDD (degenerative disc disease), cervical   . Dysphagia   . Edema extremities 01/13/2016  . GERD (gastroesophageal reflux disease)    Decdreased, has decreased coffee intack.  . Glaucoma   . HTN (hypertension)   . Hx of adenomatous colonic polyps 01/23/2016  . Hyperlipidemia   . Hypogonadism male   . Hypothyroidism   . OSA (obstructive sleep apnea)    Has had surgery x 2  . Parkinsonism ([place]) 01/16/2013   no tremors, balance issues only , speech and swallowing difficulties   . Pre-diabetes   . Sleep apnea    surgery x 2- no cpap  . Staph infection 10-2014  . Thyroid disease    HYPOTHYROID    Past Surgical History:  Procedure Laterality Date  . CATARACT EXTRACTION    . COLONOSCOPY  04-27-2004   tics, hems   . I&D EXTREMITY Right 10/18/2014   Procedure: IRRIGATION AND DEBRIDEMENT RIGHT KNEE PREPATELLA BURSA INFECTION;  Surgeon: [name], MD;  Location: [facility];  Service: Orthopedics;  Laterality: Right;  . [place] SURGERY  04-2015  . LUMBAR LAMINECTOMY WITH COFLEX 1 LEVEL N/A 06/06/2015   Procedure: Lumbar three-four Laminectomy/Foraminotomy with  placement of Coflex;  Surgeon: [name], MD;  Location: [facility];  Service: Neurosurgery;  Laterality: N/A;  . NASAL SEPTUM SURGERY    . TONSILLECTOMY     and adenoids  . UVULOPALATOPHARYNGOPLASTY      There were no vitals filed for this visit.      Subjective Assessment - 06/05/16 1020    Subjective Pt arrives with wife today with loud speech as greeting. After this, speech soft, with repetitions and fast rate.               ADULT SLP TREATMENT - 06/07/16 1020      General Information   Behavior/Cognition Alert;Cooperative;Pleasant mood;Impulsive     Treatment Provided   Treatment provided Cognitive-Linquistic     Cognitive-Linquistic Treatment   Treatment focused on Dysarthria   Skilled Treatment Pt with "wet" voice and could not clear adequately until coughed - his attempt sounded like a semi-sharp cough. Pt read question cards with consistent mod cues to chunk speech, and mod-max A consistently for responses with chunked speech. When pt did so, intelligiblity improved to a functional level.       Assessment / Recommendations / Plan   Plan Continue with current plan of care     Progression Toward Goals   Progression toward goals Progressing toward  goals              SLP Long Term Goals - 06/07/16 1734      SLP LONG TERM GOAL #1   Title pt will demo 85% intelligibility using compensations in 5 minute conversation    Time 2   Period Weeks   Status On-going     SLP LONG TERM GOAL #2   Title pt/wife will tell SLP3 ways to improve pt's communicative effectiveness by manipulating environment for pt's benefit   Time 2   Period Weeks   Status On-going     SLP LONG TERM GOAL #3   Title pt/wife will demo understanding of 4 s/s aspiration PNA   Time 2   Period Weeks   Status On-going          Plan - 06/07/16 1214    Clinical Impression Statement Pt's dysarthria cont'd to be targeted today in ST. Pt req'd consistent cues (visual, verbal, demo)  for improved intelligibility when attempting "chunking"at the phrase/sentence level. Pt would cont to benefit from skilled ST targeting incr'd speech intelligibility, and for caregiver education re: how to structure pt's comunicative intents for maximal engagement/understanding.   Speech Therapy Frequency 2x / week   Treatment/Interventions Diet toleration management by SLP;Aspiration precaution training;SLP instruction and feedback;Compensatory strategies;Language facilitation;Patient/family education;Functional tasks;Cueing hierarchy;Internal/external aids;Environmental controls   Potential to Achieve Goals Fair   Potential Considerations Ability to learn/carryover information;Severity of impairments   Consulted and Agree with Plan of Care Patient;Family member/caregiver   Family Member Consulted spouse      Patient will benefit from skilled therapeutic intervention in order to improve the following deficits and impairments:   Dysarthria and anarthria  Oropharyngeal dysphagia  Cognitive communication deficit  Aphasia    Problem List Patient Active Problem List   Diagnosis Date Noted  . Hx of adenomatous colonic polyps 01/23/2016  . Edema extremities 01/13/2016  . Lumbar stenosis 06/06/2015  . HOCM (hypertrophic obstructive cardiomyopathy) ([place]) 12/12/2014  . Infection of right prepatellar bursa 10/18/2014  . DOE (dyspnea on exertion) 09/06/2014  . Fatigue due to excessive exertion 09/06/2014  . Testosterone deficiency 11/12/2013  . Routine general medical examination at a health care facility 08/30/2013  . Mixed hyperlipidemia 08/30/2013  . Other abnormal glucose 08/30/2013  . 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 Parkinsonism ([place]) 01/16/2013  . INSOMNIA 06/06/2008  . GLAUCOMA 06/01/2008  . Essential hypertension 06/01/2008  . Seasonal and perennial allergic rhinitis 06/01/2008    [facility] ,MS, CCC-SLP  06/07/2016, 5:34 PM  [facility] [address] [place], [place], [address] Phone: [phone]   Fax:  [phone]   Name: [name] MRN: [id number] Date of Birth: 06-02-1946

## 2016-06-12 ENCOUNTER — Ambulatory Visit: Payer: Medicare Other

## 2016-06-12 ENCOUNTER — Ambulatory Visit: Payer: Medicare Other | Admitting: Occupational Therapy

## 2016-06-12 ENCOUNTER — Ambulatory Visit: Payer: Medicare Other | Attending: Neurology | Admitting: Physical Therapy

## 2016-06-12 DIAGNOSIS — M6281 Muscle weakness (generalized): Secondary | ICD-10-CM

## 2016-06-12 DIAGNOSIS — R41840 Attention and concentration deficit: Secondary | ICD-10-CM | POA: Insufficient documentation

## 2016-06-12 DIAGNOSIS — R1312 Dysphagia, oropharyngeal phase: Secondary | ICD-10-CM | POA: Diagnosis not present

## 2016-06-12 DIAGNOSIS — R471 Dysarthria and anarthria: Secondary | ICD-10-CM

## 2016-06-12 DIAGNOSIS — R2689 Other abnormalities of gait and mobility: Secondary | ICD-10-CM

## 2016-06-12 DIAGNOSIS — R41841 Cognitive communication deficit: Secondary | ICD-10-CM

## 2016-06-12 DIAGNOSIS — R2681 Unsteadiness on feet: Secondary | ICD-10-CM | POA: Diagnosis not present

## 2016-06-12 DIAGNOSIS — R278 Other lack of coordination: Secondary | ICD-10-CM | POA: Insufficient documentation

## 2016-06-12 DIAGNOSIS — R29818 Other symptoms and signs involving the nervous system: Secondary | ICD-10-CM

## 2016-06-12 DIAGNOSIS — R29898 Other symptoms and signs involving the musculoskeletal system: Secondary | ICD-10-CM | POA: Insufficient documentation

## 2016-06-12 DIAGNOSIS — R4701 Aphasia: Secondary | ICD-10-CM | POA: Insufficient documentation

## 2016-06-12 NOTE — Therapy (Signed)
[facility] [address] [place], [place], [address] Phone: [phone]   Fax:  [phone]  Occupational Therapy Treatment  Patient Details  Name: [name] MRN: [id number] Date of Birth: [birth year] Referring Provider: Dr. Rexene Alberts  Encounter Date: 06/12/2016      OT End of Session - 06/12/16 1304    Visit Number 8   Number of Visits 17   Date for OT Re-Evaluation 07/21/16   Authorization Type Medicare   Authorization - Visit Number 8   Authorization - Number of Visits 10   OT Start Time 1020   OT Stop Time 1100   OT Time Calculation (min) 40 min   Activity Tolerance Patient tolerated treatment well   Behavior During Therapy [facility] for tasks assessed/performed      Past Medical History:  Diagnosis Date  . Allergic rhinitis   . Allergy   . Arthritis   . At risk for falls   . Cancer ([place])    basal cell skin cancer  . Cataract   . Complication of anesthesia    Slow to awaken  . Constipation   . DDD (degenerative disc disease), cervical   . Dysphagia   . Edema extremities 01/13/2016  . GERD (gastroesophageal reflux disease)    Decdreased, has decreased coffee intack.  . Glaucoma   . HTN (hypertension)   . Hx of adenomatous colonic polyps 01/23/2016  . Hyperlipidemia   . Hypogonadism male   . Hypothyroidism   . OSA (obstructive sleep apnea)    Has had surgery x 2  . Parkinsonism ([place]) 01/16/2013   no tremors, balance issues only , speech and swallowing difficulties   . Pre-diabetes   . Sleep apnea    surgery x 2- no cpap  . Staph infection 10-2014  . Thyroid disease    HYPOTHYROID    Past Surgical History:  Procedure Laterality Date  . CATARACT EXTRACTION    . COLONOSCOPY  04-27-2004   tics, hems   . I&D EXTREMITY Right 10/18/2014   Procedure: IRRIGATION AND DEBRIDEMENT RIGHT KNEE PREPATELLA BURSA INFECTION;  Surgeon: [name], MD;  Location: [facility];  Service: Orthopedics;  Laterality: Right;  .  [place] SURGERY  04-2015  . LUMBAR LAMINECTOMY WITH COFLEX 1 LEVEL N/A 06/06/2015   Procedure: Lumbar three-four Laminectomy/Foraminotomy with placement of Coflex;  Surgeon: [name], MD;  Location: [facility];  Service: Neurosurgery;  Laterality: N/A;  . NASAL SEPTUM SURGERY    . TONSILLECTOMY     and adenoids  . UVULOPALATOPHARYNGOPLASTY      There were no vitals filed for this visit.      Subjective Assessment - 06/12/16 1259    Subjective  Pt's wife reports they practiced brushing hair   Pertinent History see Epic   Patient Stated Goals improved ADL performance, decreased stiffness   Currently in Pain? No/denies         Therapist reviewed adapted strategies for the following ADL tasks, using large amplitude movements: brushing hair, sit to stand from chair, donning jacket and shirt seated, aplying gel to hands,all with mod v.c/ ./ demonstration then pt improved with practice.. Dealing cards with large movements, followed by pt dealing cards then pt verbally instructing therapist in a card game(for cognitive component) and playing with pt dealing cards , min-mod v.c.                      OT Short Term Goals - 06/07/16  [place] #1   Title Pt will perform updated HEP with supervision/min cues.   Time 4   Period Weeks   Status New     OT SHORT TERM GOAL #2   Title Pt will write name with at least 90% legibility at least 2/3 trials   Status Achieved  met 06/07/16     OT SHORT TERM GOAL #3   Title Pt will report incr ease with washing hair.     OT SHORT TERM GOAL #4   Title Pt will demonstrate increased ease with dressing as evidenced by decreasing 3 button/ unbutton score to 45 secs or less   Baseline 52.79 secs   Time 4   Period Weeks   Status New           OT Long Term Goals - 04/27/16 1506      OT LONG TERM GOAL #1   Title Pt/caregiver will verbalize understanding of adaptive strategies/AE for ADLs prn.   Time 8    Period Weeks   Status New     OT LONG TERM GOAL #2   Title Pt will demonstrate improved RUE functional use as evidenced by increasing box/ blocks score to 44 blocks   Baseline 40   Time 8   Period Weeks   Status New     OT LONG TERM GOAL #3   Title Pt will be able to write a simple phrase with at least 90% legibility at least 2/3 trials   Time 8   Period Weeks   Status New     OT LONG TERM GOAL #4   Title Pt will be able to brush his hair with min A.   Time 8   Period Weeks   Status New               Plan - 06/12/16 1311    Clinical Impression Statement Pt is progressing towards goals with improving carryover of strategies for functional tasks.   Rehab Potential Fair   OT Frequency 2x / week   OT Duration 8 weeks   OT Treatment/Interventions Self-care/ADL training;Therapeutic exercise;Functional Mobility Training;Patient/family education;Balance training;Splinting;Manual Therapy;Neuromuscular education;Ultrasound;Energy conservation;Therapeutic exercises;Therapeutic activities;DME and/or AE instruction;Parrafin;Cryotherapy;Electrical Stimulation;Fluidtherapy;Cognitive remediation/compensation;Moist Heat;Contrast Bath;Passive range of motion   Plan check STG's, reinforce strategies for ADLs   OT Home Exercise Plan issued coordination, PWR! hands   Consulted and Agree with Plan of Care Patient;Family member/caregiver   Family Member Consulted wife      Patient will benefit from skilled therapeutic intervention in order to improve the following deficits and impairments:  Abnormal gait, Decreased coordination, Decreased range of motion, Difficulty walking, Impaired flexibility, Impaired sensation, Increased edema, Decreased safety awareness, Decreased endurance, Decreased activity tolerance, Decreased knowledge of precautions, Impaired tone, Pain, Impaired UE functional use, Decreased knowledge of use of DME, Decreased balance, Decreased cognition, Decreased mobility,  Decreased strength, Impaired perceived functional ability  Visit Diagnosis: Other symptoms and signs involving the nervous system  Other lack of coordination  Muscle weakness (generalized)  Other abnormalities of gait and mobility    Problem List Patient Active Problem List   Diagnosis Date Noted  . Hx of adenomatous colonic polyps 01/23/2016  . Edema extremities 01/13/2016  . Lumbar stenosis 06/06/2015  . HOCM (hypertrophic obstructive cardiomyopathy) ([place]) 12/12/2014  . Infection of right prepatellar bursa 10/18/2014  . DOE (dyspnea on exertion) 09/06/2014  . Fatigue due to excessive exertion 09/06/2014  . Testosterone deficiency 11/12/2013  .  Routine general medical examination at a health care facility 08/30/2013  . Mixed hyperlipidemia 08/30/2013  . Other abnormal glucose 08/30/2013  . 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 Parkinsonism ([place]) 01/16/2013  . INSOMNIA 06/06/2008  . GLAUCOMA 06/01/2008  . Essential hypertension 06/01/2008  . Seasonal and perennial allergic rhinitis 06/01/2008    RINE,KATHRYN 06/12/2016, 1:15 PM [name], OTR/L [phone] Phone: [phone] 1:15 PM 06/12/16 [facility] [address] [place], [place], [address] Phone: [phone]   Fax:  [phone]  Name: [name] MRN: [id number] Date of Birth: 04-29-1946

## 2016-06-12 NOTE — Patient Instructions (Signed)
   It would help you best to practice speech (chunking, louder speech) at least 20 minutes each day, or 15 minutes, twice each day.

## 2016-06-12 NOTE — Therapy (Signed)
[facility] [address] [place], [place], [address] Phone: [phone]   Fax:  [phone]  Physical Therapy Treatment  Patient Details  Name: [name] MRN: [id number] Date of Birth: 07-26-1946 Referring Provider: Rexene Alberts  Encounter Date: 06/12/2016      PT End of Session - 06/12/16 1309    Visit Number 8   Number of Visits 17   Date for PT Re-Evaluation 06/26/16   Authorization Type Medicare/BCBS-GCODE every 10th visit   PT Start Time 1101   PT Stop Time 1150   PT Time Calculation (min) 49 min   Equipment Utilized During Treatment Gait belt   Activity Tolerance Patient tolerated treatment well   Behavior During Therapy [facility] for tasks assessed/performed      Past Medical History:  Diagnosis Date  . Allergic rhinitis   . Allergy   . Arthritis   . At risk for falls   . Cancer ([place])    basal cell skin cancer  . Cataract   . Complication of anesthesia    Slow to awaken  . Constipation   . DDD (degenerative disc disease), cervical   . Dysphagia   . Edema extremities 01/13/2016  . GERD (gastroesophageal reflux disease)    Decdreased, has decreased coffee intack.  . Glaucoma   . HTN (hypertension)   . Hx of adenomatous colonic polyps 01/23/2016  . Hyperlipidemia   . Hypogonadism male   . Hypothyroidism   . OSA (obstructive sleep apnea)    Has had surgery x 2  . Parkinsonism ([place]) 01/16/2013   no tremors, balance issues only , speech and swallowing difficulties   . Pre-diabetes   . Sleep apnea    surgery x 2- no cpap  . Staph infection 10-2014  . Thyroid disease    HYPOTHYROID    Past Surgical History:  Procedure Laterality Date  . CATARACT EXTRACTION    . COLONOSCOPY  04-27-2004   tics, hems   . I&D EXTREMITY Right 10/18/2014   Procedure: IRRIGATION AND DEBRIDEMENT RIGHT KNEE PREPATELLA BURSA INFECTION;  Surgeon: [name], MD;  Location: [facility];  Service: Orthopedics;  Laterality: Right;  .  [place] SURGERY  04-2015  . LUMBAR LAMINECTOMY WITH COFLEX 1 LEVEL N/A 06/06/2015   Procedure: Lumbar three-four Laminectomy/Foraminotomy with placement of Coflex;  Surgeon: [name], MD;  Location: [facility];  Service: Neurosurgery;  Laterality: N/A;  . NASAL SEPTUM SURGERY    . TONSILLECTOMY     and adenoids  . UVULOPALATOPHARYNGOPLASTY      There were no vitals filed for this visit.      Subjective Assessment - 06/12/16 1058    Subjective No falls, read a book this weekend.    Patient is accompained by: Family member  wife   Patient Stated Goals Pt's goals for therapy is to improve balance and strengthening.   Currently in Pain? No/denies            [facility] PT Assessment - 06/12/16 1126      Timed Up and Go Test   Normal TUG (seconds) 21.91  at best                     [facility] Adult PT Treatment/Exercise - 06/12/16 0001      Transfers   Transfers Sit to Stand;Stand to Sit   Sit to Stand 5: Supervision   Stand to Sit 5: Supervision;With upper extremity assist;To chair/3-in-1   Number of Reps  10 reps     Exercises   Exercises Other Exercises   Other Exercises  Supine for bilat hamstring stretch contract/relax 3 x 30 seconds each.  PROM R heel cord stretch x 30 sec x 3;supine for P/AAROM into neck rotation bil directions x 3 with passive stretch.  Seated neck lateral flexion x 2 reps each with passive stretch     Pt performs previous HEP exercises: -supine hamstring stretching using belt, hooklying hip abduction, hooklying hip flexion, SLR, with cues for 3-5 second hold on each exercise.  Pt return demo understanding of HEP.  Pt performs supine hip external rotation, with passive stretch 3 x 15 seconds, then patient self-stretch, 2 x 15 seconds.  Also performed self-stretch in sitting into hip external rotation, 2 reps x 15 seconds (Added to HEP)    Discussed pt's reported comfort and fluidity of movement with stretching.  Discussed need to  continue to add stretches to HEP which patient can perform at home, to improve consistency and performance of stretching.        PT Education - 06/12/16 1303    Education provided Yes   Education Details HEP addition for stretch into hip external rotation   Person(s) Educated Patient;Spouse   Methods Explanation;Demonstration;Handout   Comprehension Verbalized understanding;Returned demonstration;Verbal cues required          PT Short Term Goals - 06/12/16 1114      PT SHORT TERM GOAL #1   Title Pt will perform HEP for strengthening, balance and gait with wife's supervision.  TARGET 05/26/16  UPDATED TARGET 06/15/16, as visits remain in [place] for STGs   Time 4   Period Weeks   Status Achieved     PT SHORT TERM GOAL #2   Title Pt will perform at least 8 of 10 reps of sit<>stand transfers from 18 inch surfaces with minimal UE support and no posterior lean, for improved safety and efficiency of transfers.   Baseline (pt's wife reports transfers not as good at home)   Time 4   Period Weeks   Status Achieved     PT SHORT TERM GOAL #3   Title Pt will improve TUG socre to less than or equal to 23 seconds for decreased fall risk.   Time 4   Period Weeks   Status Achieved     PT SHORT TERM GOAL #4   Title Pt/wife will verbalize understanding of techniques to decrease festinating/freezing of gait and turns   Baseline Wife verbalizes understanding and provides cues for pt.  Pt able to verbalize stopping and weightshifting.   Time 4   Period Weeks   Status Achieved           PT Long Term Goals - 04/27/16 1224      PT LONG TERM GOAL #1   Title Pt/wife will verbalize understanding of fall prevention within the home.  TARGET 06/26/16   Time 8   Period Weeks   Status New     PT LONG TERM GOAL #2   Title Pt will improve Berg Balance score to at least 27/56 for decreased fall risk.   Time 8   Period Weeks   Status New     PT LONG TERM GOAL #3   Title Pt will ambulate at least  750 ft. in 3 minute walk test for improved gait efficiency and safety.   Time 8   Period Weeks   Status New     PT LONG TERM GOAL #4  Title Pt/wife will report at least 25% improvement in turns and in bathroom spaces for decreased fall risk.   Time 8   Status New     PT LONG TERM GOAL #5   Title Pt will verbalize plans for continued community fitness upon D/C from PT.   Time 8   Period Weeks   Status New               Plan - 06/12/16 1310    Clinical Impression Statement Pt has met STG #1-4.  Pt appears to have less cueing during PT sessions, but wife continues to report decreased safety awareness with standing and gait activities at home.  He reports stretching is helping with overall movement patterns.  Pt would continue to benefit from further skilled PT to benefit from gait, strengthening and functional mobility activities.   Rehab Potential Good   PT Frequency 2x / week   PT Duration 8 weeks   PT Treatment/Interventions ADLs/Self Care Home Management;Therapeutic exercise;Therapeutic activities;Functional mobility training;Gait training;Balance training;Neuromuscular re-education;Patient/family education   PT Next Visit Plan Continue to update home program for stretching and strengthening   Consulted and Agree with Plan of Care Patient;Family member/caregiver   Family Member Consulted wife, [name]      Patient will benefit from skilled therapeutic intervention in order to improve the following deficits and impairments:  Abnormal gait, Decreased balance, Decreased activity tolerance, Decreased range of motion, Decreased safety awareness, Decreased mobility, Decreased strength, Postural dysfunction, Impaired flexibility  Visit Diagnosis: Muscle weakness (generalized)  Other symptoms and signs involving the nervous system     Problem List Patient Active Problem List   Diagnosis Date Noted  . Hx of adenomatous colonic polyps 01/23/2016  . Edema extremities  01/13/2016  . Lumbar stenosis 06/06/2015  . HOCM (hypertrophic obstructive cardiomyopathy) ([place]) 12/12/2014  . Infection of right prepatellar bursa 10/18/2014  . DOE (dyspnea on exertion) 09/06/2014  . Fatigue due to excessive exertion 09/06/2014  . Testosterone deficiency 11/12/2013  . Routine general medical examination at a health care facility 08/30/2013  . Mixed hyperlipidemia 08/30/2013  . Other abnormal glucose 08/30/2013  . 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 Parkinsonism ([place]) 01/16/2013  . INSOMNIA 06/06/2008  . GLAUCOMA 06/01/2008  . Essential hypertension 06/01/2008  . Seasonal and perennial allergic rhinitis 06/01/2008    Suzy Kugel W. 06/12/2016, 1:14 PM  Frazier Butt., PT  [place]ren[place] Deroy Gerrit Friends, PT 06/12/16 1:20 PM Phone: [phone] Fax: [facility] [address] [place], [place], [address] Phone: [phone]   Fax:  [phone]  Name: [name] MRN: [id number] Date of Birth: 26-Sep-1946

## 2016-06-12 NOTE — Therapy (Signed)
[name] [address] [place], [place], [address] Phone: [phone]   Fax:  [phone]  Speech Language Pathology Treatment  Patient Details  Name: [name] MRN: [id number] Date of Birth: [birth year] Referring Provider: Alonza Bogus, DO  Encounter Date: 06/12/2016      End of Session - 06/12/16 1021    Visit Number 6   Number of Visits 9   Date for SLP Re-Evaluation 06/22/16   SLP Start Time 0937   SLP Stop Time  1016   SLP Time Calculation (min) 39 min   Activity Tolerance Patient tolerated treatment well      Past Medical History:  Diagnosis Date  . Allergic rhinitis   . Allergy   . Arthritis   . At risk for falls   . Cancer ([place])    basal cell skin cancer  . Cataract   . Complication of anesthesia    Slow to awaken  . Constipation   . DDD (degenerative disc disease), cervical   . Dysphagia   . Edema extremities 01/13/2016  . GERD (gastroesophageal reflux disease)    Decdreased, has decreased coffee intack.  . Glaucoma   . HTN (hypertension)   . Hx of adenomatous colonic polyps 01/23/2016  . Hyperlipidemia   . Hypogonadism male   . Hypothyroidism   . OSA (obstructive sleep apnea)    Has had surgery x 2  . Parkinsonism ([place]) 01/16/2013   no tremors, balance issues only , speech and swallowing difficulties   . Pre-diabetes   . Sleep apnea    surgery x 2- no cpap  . Staph infection 10-2014  . Thyroid disease    HYPOTHYROID    Past Surgical History:  Procedure Laterality Date  . CATARACT EXTRACTION    . COLONOSCOPY  04-27-2004   tics, hems   . I&D EXTREMITY Right 10/18/2014   Procedure: IRRIGATION AND DEBRIDEMENT RIGHT KNEE PREPATELLA BURSA INFECTION;  Surgeon: [name], MD;  Location: [facility];  Service: Orthopedics;  Laterality: Right;  . [place] SURGERY  04-2015  . LUMBAR LAMINECTOMY WITH COFLEX 1 LEVEL N/A 06/06/2015   Procedure: Lumbar three-four Laminectomy/Foraminotomy with  placement of Coflex;  Surgeon: [name], MD;  Location: [facility];  Service: Neurosurgery;  Laterality: N/A;  . NASAL SEPTUM SURGERY    . TONSILLECTOMY     and adenoids  . UVULOPALATOPHARYNGOPLASTY      There were no vitals filed for this visit.      Subjective Assessment - 06/12/16 0940    Subjective "Thought about (chunking) periodically."   Patient is accompained by: --  wife, [name]               ADULT SLP TREATMENT - 06/12/16 0957      General Information   Behavior/Cognition Alert;Cooperative;Pleasant mood;Impulsive     Treatment Provided   Treatment provided Cognitive-Linquistic     Cognitive-Linquistic Treatment   Treatment focused on Dysarthria   Skilled Treatment In simple conversation of 7 minutes pt req'd consistent max A for chunking. In strucutred tasks for sentence responses pt req'd consistent max cues to chunk. Pt's loudness was near-functional, with mod A req'd for loudness usually.     Assessment / Recommendations / Plan   Plan Continue with current plan of care     Progression Toward Goals   Progression toward goals Progressing toward goals          SLP Education - 06/12/16 1019    Education  provided Yes   Education Details Need for consistent daily practice for improvement of intelligibliity, positioning/compensations for improving inteligibliity   Person(s) Educated Patient;Spouse   Methods Explanation   Comprehension Verbalized understanding            SLP Long Term Goals - 06/12/16 1025      SLP LONG TERM GOAL #1   Title pt will demo 85% intelligibility using compensations in 5 minute conversation    Time 1   Period Weeks   Status On-going     SLP LONG TERM GOAL #2   Title pt/wife will tell SLP3 ways to improve pt's communicative effectiveness by manipulating environment for pt's benefit   Time 1   Period Weeks   Status On-going     SLP LONG TERM GOAL #3   Title pt/wife will demo understanding of 4 s/s aspiration  PNA   Time 1   Period Weeks   Status On-going          Plan - 06/12/16 1021    Clinical Impression Statement Pt's dysarthria cont'd to be targeted today in ST. Pt intelligibilty improved when "chunking" responses at the phrase/sentence level, and req'd SLP A consistently to remember to use this technique. SLP stressed logisitics during conversation to aid in improving intelligbility. Pt would cont to benefit from skilled ST targeting incr'd speech intelligibility, and for caregiver education re: how to structure pt's comunicative intents for maximal engagement/understanding.   Speech Therapy Frequency 2x / week   Treatment/Interventions Diet toleration management by SLP;Aspiration precaution training;SLP instruction and feedback;Compensatory strategies;Language facilitation;Patient/family education;Functional tasks;Cueing hierarchy;Internal/external aids;Environmental controls   Potential to Achieve Goals Fair   Potential Considerations Ability to learn/carryover information;Severity of impairments   Consulted and Agree with Plan of Care Patient;Family member/caregiver   Family Member Consulted spouse      Patient will benefit from skilled therapeutic intervention in order to improve the following deficits and impairments:   Dysarthria and anarthria  Cognitive communication deficit  Oropharyngeal dysphagia  Aphasia    Problem List Patient Active Problem List   Diagnosis Date Noted  . Hx of adenomatous colonic polyps 01/23/2016  . Edema extremities 01/13/2016  . Lumbar stenosis 06/06/2015  . HOCM (hypertrophic obstructive cardiomyopathy) ([place]) 12/12/2014  . Infection of right prepatellar bursa 10/18/2014  . DOE (dyspnea on exertion) 09/06/2014  . Fatigue due to excessive exertion 09/06/2014  . Testosterone deficiency 11/12/2013  . Routine general medical examination at a health care facility 08/30/2013  . Mixed hyperlipidemia 08/30/2013  . Other abnormal glucose 08/30/2013   . 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 Parkinsonism ([place]) 01/16/2013  . INSOMNIA 06/06/2008  . GLAUCOMA 06/01/2008  . Essential hypertension 06/01/2008  . Seasonal and perennial allergic rhinitis 06/01/2008    [facility] ,MS, CCC-SLP  06/12/2016, 10:26 AM  [facility] [address] [place], [place], [address] Phone: [phone]   Fax:  [phone]   Name: Harlin Bahri MRN: [id number] Date of Birth: 08-21-1946

## 2016-06-12 NOTE — Patient Instructions (Addendum)
CAREGIVER ASSISTED: Hip External / Internal Rotation    Cross your leglds leg at knee and heel; Push your knee down and out (you can do this sitting or lying down on your back)  Hold each position once you feel a gentle stretch, for _30__ seconds. _3_ reps per set, 1-2___ sets per day.  Copyright  VHI. All rights reserved.

## 2016-06-14 ENCOUNTER — Ambulatory Visit: Payer: Medicare Other

## 2016-06-14 ENCOUNTER — Ambulatory Visit: Payer: Medicare Other | Admitting: Occupational Therapy

## 2016-06-14 ENCOUNTER — Ambulatory Visit: Payer: Medicare Other | Admitting: Physical Therapy

## 2016-06-14 DIAGNOSIS — R29898 Other symptoms and signs involving the musculoskeletal system: Secondary | ICD-10-CM | POA: Diagnosis not present

## 2016-06-14 DIAGNOSIS — M6281 Muscle weakness (generalized): Secondary | ICD-10-CM | POA: Diagnosis not present

## 2016-06-14 DIAGNOSIS — R278 Other lack of coordination: Secondary | ICD-10-CM

## 2016-06-14 DIAGNOSIS — H01001 Unspecified blepharitis right upper eyelid: Secondary | ICD-10-CM | POA: Diagnosis not present

## 2016-06-14 DIAGNOSIS — R4701 Aphasia: Secondary | ICD-10-CM

## 2016-06-14 DIAGNOSIS — Z961 Presence of intraocular lens: Secondary | ICD-10-CM | POA: Diagnosis not present

## 2016-06-14 DIAGNOSIS — R1312 Dysphagia, oropharyngeal phase: Secondary | ICD-10-CM

## 2016-06-14 DIAGNOSIS — R471 Dysarthria and anarthria: Secondary | ICD-10-CM

## 2016-06-14 DIAGNOSIS — R2689 Other abnormalities of gait and mobility: Secondary | ICD-10-CM

## 2016-06-14 DIAGNOSIS — R41840 Attention and concentration deficit: Secondary | ICD-10-CM

## 2016-06-14 DIAGNOSIS — R29818 Other symptoms and signs involving the nervous system: Secondary | ICD-10-CM

## 2016-06-14 DIAGNOSIS — H401112 Primary open-angle glaucoma, right eye, moderate stage: Secondary | ICD-10-CM | POA: Diagnosis not present

## 2016-06-14 DIAGNOSIS — R2681 Unsteadiness on feet: Secondary | ICD-10-CM

## 2016-06-14 DIAGNOSIS — R41841 Cognitive communication deficit: Secondary | ICD-10-CM

## 2016-06-14 DIAGNOSIS — H16213 Exposure keratoconjunctivitis, bilateral: Secondary | ICD-10-CM | POA: Diagnosis not present

## 2016-06-14 NOTE — Therapy (Signed)
[facility] [address] [place], [place], [address] Phone: [phone]   Fax:  [phone]  Speech Language Pathology Treatment  Patient Details  Name: [name] MRN: [id number] Date of Birth: Jul 04, 1946 Referring Provider: Alonza Bogus, DO  Encounter Date: 06/14/2016      End of Session - 06/14/16 1000    Visit Number 7   Number of Visits 9   Date for SLP Re-Evaluation 06/22/16   SLP Start Time 0935   SLP Stop Time  1015   SLP Time Calculation (min) 40 min   Activity Tolerance Patient tolerated treatment well      Past Medical History:  Diagnosis Date  . Allergic rhinitis   . Allergy   . Arthritis   . At risk for falls   . Cancer ([facility])    basal cell skin cancer  . Cataract   . Complication of anesthesia    Slow to awaken  . Constipation   . DDD (degenerative disc disease), cervical   . Dysphagia   . Edema extremities 01/13/2016  . GERD (gastroesophageal reflux disease)    Decdreased, has decreased coffee intack.  . Glaucoma   . HTN (hypertension)   . Hx of adenomatous colonic polyps 01/23/2016  . Hyperlipidemia   . Hypogonadism male   . Hypothyroidism   . OSA (obstructive sleep apnea)    Has had surgery x 2  . Parkinsonism ([place]) 01/16/2013   no tremors, balance issues only , speech and swallowing difficulties   . Pre-diabetes   . Sleep apnea    surgery x 2- no cpap  . Staph infection 10-2014  . Thyroid disease    HYPOTHYROID    Past Surgical History:  Procedure Laterality Date  . CATARACT EXTRACTION    . COLONOSCOPY  04-27-2004   tics, hems   . I&D EXTREMITY Right 10/18/2014   Procedure: IRRIGATION AND DEBRIDEMENT RIGHT KNEE PREPATELLA BURSA INFECTION;  Surgeon: [name], MD;  Location: [facility];  Service: Orthopedics;  Laterality: Right;  . [place] SURGERY  04-2015  . LUMBAR LAMINECTOMY WITH COFLEX 1 LEVEL N/A 06/06/2015   Procedure: Lumbar three-four Laminectomy/Foraminotomy with  placement of Coflex;  Surgeon: [name], MD;  Location: [facility];  Service: Neurosurgery;  Laterality: N/A;  . NASAL SEPTUM SURGERY    . TONSILLECTOMY     and adenoids  . UVULOPALATOPHARYNGOPLASTY      There were no vitals filed for this visit.      Subjective Assessment - 06/14/16 0943    Subjective Pt's wife reports hints that pt has not completed homework.   Patient is accompained by: --  wife, [name]               ADULT SLP TREATMENT - 06/14/16 0945      General Information   Behavior/Cognition Alert;Cooperative;Pleasant mood;Impulsive     Treatment Provided   Treatment provided Cognitive-Linquistic     Cognitive-Linquistic Treatment   Treatment focused on Dysarthria   Skilled Treatment SLP told pt that he needed to practice consistently and out loud in order to benefit from completing homework. SLP had to provide pt with max cues consistently with responses at the phrase and sentence levels to use chunking. When pt did so, intelligibility improved dramatically. SLP cueing level has remained the same for some time.      Assessment / Recommendations / Plan   Plan Continue with current plan of care  consider d/c if pt not completing  homework     Progression Toward Goals   Progression toward goals Not progressing toward goals (comment)              SLP Long Term Goals - 06/14/16 1218      SLP LONG TERM GOAL #1   Title pt will demo 85% intelligibility using compensations in 5 minute conversation    Time 1   Period Weeks   Status On-going     SLP LONG TERM GOAL #2   Title pt/wife will tell SLP3 ways to improve pt's communicative effectiveness by manipulating environment for pt's benefit   Time 1   Period Weeks   Status On-going     SLP LONG TERM GOAL #3   Title pt/wife will demo understanding of 4 s/s aspiration PNA   Time 1   Period Weeks   Status On-going          Plan - 06/14/16 1215    Clinical Impression Statement Pt's dysarthria  cont'd to be targeted today in ST. Pt intelligibilty improved when "chunking" responses at the phrase/sentence level, and req'd SLP A consistently to remember to use this technique. SLP told pt additional modifications to make during conversation to aid in improving intelligbility after pt told him to turn down competing noise. Pt would cont to benefit from skilled ST targeting incr'd speech intelligibility. Pt will very likely be d/c'd next week due to decr'd participation and reaching max rehab potential at this time.   Speech Therapy Frequency 2x / week   Duration 1 week   Treatment/Interventions Diet toleration management by SLP;Aspiration precaution training;SLP instruction and feedback;Compensatory strategies;Language facilitation;Patient/family education;Functional tasks;Cueing hierarchy;Internal/external aids;Environmental controls   Potential to Achieve Goals Fair   Potential Considerations Ability to learn/carryover information;Severity of impairments   Consulted and Agree with Plan of Care Patient;Family member/caregiver      Patient will benefit from skilled therapeutic intervention in order to improve the following deficits and impairments:   Dysarthria and anarthria  Cognitive communication deficit  Aphasia  Oropharyngeal dysphagia    Problem List Patient Active Problem List   Diagnosis Date Noted  . Hx of adenomatous colonic polyps 01/23/2016  . Edema extremities 01/13/2016  . Lumbar stenosis 06/06/2015  . HOCM (hypertrophic obstructive cardiomyopathy) ([facility]) 12/12/2014  . Infection of right prepatellar bursa 10/18/2014  . DOE (dyspnea on exertion) 09/06/2014  . Fatigue due to excessive exertion 09/06/2014  . Testosterone deficiency 11/12/2013  . Routine general medical examination at a health care facility 08/30/2013  . Mixed hyperlipidemia 08/30/2013  . Other abnormal glucose 08/30/2013  . 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 Parkinsonism ([facility]) 01/16/2013  .  INSOMNIA 06/06/2008  . GLAUCOMA 06/01/2008  . Essential hypertension 06/01/2008  . Seasonal and perennial allergic rhinitis 06/01/2008    [facility] ,MS, CCC-SLP  06/14/2016, 12:19 PM  [facility] [address] [place], [place], [address] Phone: [phone]   Fax:  [phone]   Name: [name] MRN: [id number] Date of Birth: 07-06-1946

## 2016-06-14 NOTE — Therapy (Signed)
[facility] [address] [place], [place], [address] Phone: [phone]   Fax:  [phone]  Occupational Therapy Treatment  Patient Details  Name: [name] MRN: [id number] Date of Birth: [birth year] Referring Provider: Dr. Rexene Alberts  Encounter Date: 06/14/2016      OT End of Session - 06/14/16 2026    Visit Number 9   Number of Visits 17   Date for OT Re-Evaluation 07/21/16   Authorization Type Medicare   Authorization - Visit Number 9   Authorization - Number of Visits 10   OT Start Time 1022   OT Stop Time 1100   OT Time Calculation (min) 38 min   Activity Tolerance Patient tolerated treatment well   Behavior During Therapy [facility] for tasks assessed/performed      Past Medical History:  Diagnosis Date  . Allergic rhinitis   . Allergy   . Arthritis   . At risk for falls   . Cancer ([facility])    basal cell skin cancer  . Cataract   . Complication of anesthesia    Slow to awaken  . Constipation   . DDD (degenerative disc disease), cervical   . Dysphagia   . Edema extremities 01/13/2016  . GERD (gastroesophageal reflux disease)    Decdreased, has decreased coffee intack.  . Glaucoma   . HTN (hypertension)   . Hx of adenomatous colonic polyps 01/23/2016  . Hyperlipidemia   . Hypogonadism male   . Hypothyroidism   . OSA (obstructive sleep apnea)    Has had surgery x 2  . Parkinsonism ([facility]) 01/16/2013   no tremors, balance issues only , speech and swallowing difficulties   . Pre-diabetes   . Sleep apnea    surgery x 2- no cpap  . Staph infection 10-2014  . Thyroid disease    HYPOTHYROID    Past Surgical History:  Procedure Laterality Date  . CATARACT EXTRACTION    . COLONOSCOPY  04-27-2004   tics, hems   . I&D EXTREMITY Right 10/18/2014   Procedure: IRRIGATION AND DEBRIDEMENT RIGHT KNEE PREPATELLA BURSA INFECTION;  Surgeon: [name], MD;  Location: [facility];  Service: Orthopedics;  Laterality: Right;  .  [place] SURGERY  04-2015  . LUMBAR LAMINECTOMY WITH COFLEX 1 LEVEL N/A 06/06/2015   Procedure: Lumbar three-four Laminectomy/Foraminotomy with placement of Coflex;  Surgeon: [name], MD;  Location: [facility];  Service: Neurosurgery;  Laterality: N/A;  . NASAL SEPTUM SURGERY    . TONSILLECTOMY     and adenoids  . UVULOPALATOPHARYNGOPLASTY      There were no vitals filed for this visit.      Subjective Assessment - 06/14/16 1027    Subjective  Fell yesterday getting out of shower   Patient is accompained by: Family member  wife   Pertinent History see Epic   Patient Stated Goals improved ADL performance, decreased stiffness   Currently in Pain? No/denies       Self Care:    Reinforced importance of slowing down and having a positive attitude towards use of strategies instead of expecting to fail and reinforced importance of ways to decr fall risk and avoid aspiration pneumonia as they place pt at highest risk for significant injury.  Pt/wfie verbalized understanding.     Simulated drying off after bathing with towel.  mod cues given to dry each area fully with large amplitude movements as pt initially did not attempt to dry off LEs which may contribute to  fall.  (wife expressed that she felt that he did not allow enough time to dry off prior to standing last night)  Pt/wife verbalized understanding.  Washing hands at sink with min-mod cueing (verbal, tactile, demo) for use of large amplitude movements (palm to fingertips). Pt demo improvement with cueing (with pt saying cue) and repetition.  Reviewed strategy of putting utensil down and open hand after each bite to slow task down and allow incr time for big swallow.  Practiced with yogurt with occasional min v.c.for use of swallowing techniques/eating strategy.   (improved significantly)  Practiced writing name with min cueing and good legibility/size.  Writing address with good size and 100% legibility.  Functional  mobility:  Ambulating with CGA within gym with min A/cueing  to stop pt from trying to pick up paper towel from floor.  When questioned, pt able to report that he should have sat down and had wife get it and not bent over and reviewed importance of avoiding picking up things from the floor due to impulsivity and fall risk.                               OT Education - 06/14/16 2020    Education Details Importance of sitting and FULLY drying off with big movements prior to trying to stand after bathing   Person(s) Educated Patient;Spouse   Methods Explanation   Comprehension Verbalized understanding          OT Short Term Goals - 06/14/16 2028      OT SHORT TERM GOAL #1   Title Pt will perform updated HEP with supervision/min cues.   Time 4   Period Weeks   Status On-going     OT SHORT TERM GOAL #2   Title Pt will write name with at least 90% legibility at least 2/3 trials   Time 4   Period Weeks   Status Achieved     OT SHORT TERM GOAL #3   Title Pt will report incr ease with washing hair.   Time 4   Period Weeks   Status On-going     OT SHORT TERM GOAL #4   Title Pt will demonstrate increased ease with dressing as evidenced by decreasing 3 button/ unbutton score to 45 secs or less   Time 4   Period Weeks   Status On-going           OT Long Term Goals - 06/14/16 2043      OT LONG TERM GOAL #1   Title Pt/caregiver will verbalize understanding of adaptive strategies/AE for ADLs prn.   Time 8   Period Weeks   Status On-going     OT LONG TERM GOAL #2   Title Pt will demonstrate improved RUE functional use as evidenced by increasing box/ blocks score to 44 blocks   Baseline 40   Time 8   Period Weeks   Status New     OT LONG TERM GOAL #3   Title Pt will be able to write a simple phrase with at least 90% legibility at least 2/3 trials   Time 8   Period Weeks   Status On-going  06/14/16:  able to write name and address     OT LONG TERM  GOAL #4   Title Pt will be able to brush his hair with min A.   Time 8   Period Weeks   Status New  Plan - 06/14/16 2027    Clinical Impression Statement Pt is slowly progressing towards goals with less cueing required for large amplitude movements and incr ease with ADLs.  Pt demo significantly improved ability to swallow and eat at slower pace with use of eating strategies with only min occasional cues in clinic today. However, pt continues to be a fall risk due to cognitive deficits, decr balance, and impulsivity.   Rehab Potential Fair   OT Frequency 2x / week   OT Duration 8 weeks   OT Treatment/Interventions Self-care/ADL training;Therapeutic exercise;Functional Mobility Training;Patient/family education;Balance training;Splinting;Manual Therapy;Neuromuscular education;Ultrasound;Energy conservation;Therapeutic exercises;Therapeutic activities;DME and/or AE instruction;Parrafin;Cryotherapy;Electrical Stimulation;Fluidtherapy;Cognitive remediation/compensation;Moist Heat;Contrast Bath;Passive range of motion   Plan G-code next visit, check unmet STGs (washing hair, buttoning), reinforce strategies for ADLs   OT Home Exercise Plan issued coordination, PWR! hands   Consulted and Agree with Plan of Care Patient;Family member/caregiver   Family Member Consulted wife      Patient will benefit from skilled therapeutic intervention in order to improve the following deficits and impairments:  Abnormal gait, Decreased coordination, Decreased range of motion, Difficulty walking, Impaired flexibility, Impaired sensation, Increased edema, Decreased safety awareness, Decreased endurance, Decreased activity tolerance, Decreased knowledge of precautions, Impaired tone, Pain, Impaired UE functional use, Decreased knowledge of use of DME, Decreased balance, Decreased cognition, Decreased mobility, Decreased strength, Impaired perceived functional ability  Visit Diagnosis: Other  symptoms and signs involving the nervous system  Other symptoms and signs involving the musculoskeletal system  Other lack of coordination  Other abnormalities of gait and mobility  Unsteadiness on feet  Attention and concentration deficit    Problem List Patient Active Problem List   Diagnosis Date Noted  . Hx of adenomatous colonic polyps 01/23/2016  . Edema extremities 01/13/2016  . Lumbar stenosis 06/06/2015  . HOCM (hypertrophic obstructive cardiomyopathy) ([place] [facility]) 12/12/2014  . Infection of right prepatellar bursa 10/18/2014  . DOE (dyspnea on exertion) 09/06/2014  . Fatigue due to excessive exertion 09/06/2014  . Testosterone deficiency 11/12/2013  . Routine general medical examination at a health care facility 08/30/2013  . Mixed hyperlipidemia 08/30/2013  . Other abnormal glucose 08/30/2013  . 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 Parkinsonism ([facility]) 01/16/2013  . INSOMNIA 06/06/2008  . GLAUCOMA 06/01/2008  . Essential hypertension 06/01/2008  . Seasonal and perennial allergic rhinitis 06/01/2008    [facility] 06/14/2016, 8:44 PM  [facility] [address] [place], [place], [address] Phone: [phone]   Fax:  [phone]  Name: [name] MRN: [id number] Date of Birth: [birth year]   Vianne Bulls, OTR/L [facility] [address] [place], [place]  [address] [phone] phone [phone] 06/14/16 8:44 PM

## 2016-06-14 NOTE — Patient Instructions (Addendum)
MAKE SURE TO SIT UP NICE AND TALL, SQUEEZE YOUR SHOULDER BLADES, KEEPING YOUR BEST TALL POSTURE  Active Neck Rotation    With head in a comfortable position and chin gently tucked in, rotate head to the right. Hold __5__ seconds. Repeat to the left. Repeat _5_ times. Do __1-2__ sessions per day.  http://gt2.exer.us/10   Copyright  VHI. All rights reserved.  Neck Side-Bending    Begin with chin level, head centered over spine. Slowly lower one ear toward shoulder. Hold _5__ seconds. Slowly return to starting position. Repeat to other side. Repeat __5__ times to each side.  Copyright  VHI. All rights reserved.

## 2016-06-15 NOTE — Therapy (Signed)
[facility] [address] [place], [place], [address] Phone: [phone]   Fax:  [phone]  Physical Therapy Treatment  Patient Details  Name: [name] MRN: [id number] Date of Birth: 09-30-1946 Referring Provider: Rexene Alberts  Encounter Date: 06/14/2016      PT End of Session - 06/15/16 0912    Visit Number 9   Number of Visits 17   Date for PT Re-Evaluation 06/26/16   Authorization Type Medicare/BCBS-GCODE every 10th visit   PT Start Time 1103   PT Stop Time 1144   PT Time Calculation (min) 41 min   Equipment Utilized During Treatment Gait belt   Activity Tolerance Patient tolerated treatment well   Behavior During Therapy [facility] for tasks assessed/performed      Past Medical History:  Diagnosis Date  . Allergic rhinitis   . Allergy   . Arthritis   . At risk for falls   . Cancer ([place])    basal cell skin cancer  . Cataract   . Complication of anesthesia    Slow to awaken  . Constipation   . DDD (degenerative disc disease), cervical   . Dysphagia   . Edema extremities 01/13/2016  . GERD (gastroesophageal reflux disease)    Decdreased, has decreased coffee intack.  . Glaucoma   . HTN (hypertension)   . Hx of adenomatous colonic polyps 01/23/2016  . Hyperlipidemia   . Hypogonadism male   . Hypothyroidism   . OSA (obstructive sleep apnea)    Has had surgery x 2  . Parkinsonism ([place]) 01/16/2013   no tremors, balance issues only , speech and swallowing difficulties   . Pre-diabetes   . Sleep apnea    surgery x 2- no cpap  . Staph infection 10-2014  . Thyroid disease    HYPOTHYROID    Past Surgical History:  Procedure Laterality Date  . CATARACT EXTRACTION    . COLONOSCOPY  04-27-2004   tics, hems   . I&D EXTREMITY Right 10/18/2014   Procedure: IRRIGATION AND DEBRIDEMENT RIGHT KNEE PREPATELLA BURSA INFECTION;  Surgeon: [name], MD;  Location: [facility];  Service: Orthopedics;  Laterality: Right;  .  [place] SURGERY  04-2015  . LUMBAR LAMINECTOMY WITH COFLEX 1 LEVEL N/A 06/06/2015   Procedure: Lumbar three-four Laminectomy/Foraminotomy with placement of Coflex;  Surgeon: [name], MD;  Location: [facility];  Service: Neurosurgery;  Laterality: N/A;  . NASAL SEPTUM SURGERY    . TONSILLECTOMY     and adenoids  . UVULOPALATOPHARYNGOPLASTY      There were no vitals filed for this visit.      Subjective Assessment - 06/14/16 1111    Subjective Had a fall last night-knees onto the tub ledge trying to get out of the tub.  Wife reports patient likely trying to get out too fast.   Patient is accompained by: Family member  wife   Patient Stated Goals Pt's goals for therapy is to improve balance and strengthening.   Currently in Pain? No/denies                         [facility] Adult PT Treatment/Exercise - 06/14/16 1113      Transfers   Transfers Sit to Stand;Stand to Sit   Sit to Stand 5: Supervision;With upper extremity assist;From chair/3-in-1   Sit to Stand Details (indicate cue type and reason) Needs occasional cues for slowed pace, sequence of transfers in order to avoid posterior  lean   Stand to Sit 5: Supervision;With upper extremity assist;To chair/3-in-1     Ambulation/Gait   Ambulation/Gait Yes   Ambulation/Gait Assistance 4: Min guard;5: Supervision   Ambulation/Gait Assistance Details Pt needs occasional cues for slowing gait pace/stopping to lift walker to negotiate door thresholds and sidewalk breaks   Ambulation Distance (Feet) 800 Feet  outdoors; 200 ft indoors   Assistive device Rolling walker   Gait Pattern Step-through pattern;Decreased step length - left;Decreased dorsiflexion - right;Right foot flat;Trunk flexed   Ambulation Surface Level;Indoor;Unlevel;Outdoor  sidewalks, door thresholds   Gait Comments Pt overall demonstrates good pacing with outdoor gait     Exercises   Exercises Other Exercises  Cues for upright posture during neck  exercises   Other Exercises  Discussed and reviewed/updated comprehensive stretching program:  seated hamstring stretch performed 2 reps x 30 seconds bilaterally.  Verbally reviewed supine hamstring stretch and seated heelcord stretch.  Seated A/ROM neck rotation with gentle overpressures at end range (pt c/o pain at end range overpressure with L rotation); seated cervical sidebending A/ROM, each performed 5 reps each side.  Added as part of HEP.  Reviewed time and technique needed for stretches.     Knee/Hip Exercises: Aerobic   Other Aerobic Scifit level 5.0 all 4 extremities x 8 minutes with cues for full ROM to decrease bradykinesia.  RPM >60, cues for full strides through legs      Scapular squeezes with upright posture.  Reiterated importance to patient of:  Good posture with neck exercises, technique/pacing of stretches.          PT Education - 06/15/16 0911    Education provided Yes   Education Details review of stretching technique; added neck A/ROM for neck   Person(s) Educated Patient;Spouse   Methods Explanation;Demonstration;Handout   Comprehension Verbalized understanding;Returned demonstration;Need further instruction          PT Short Term Goals - 06/12/16 1114      PT SHORT TERM GOAL #1   Title Pt will perform HEP for strengthening, balance and gait with wife's supervision.  TARGET 05/26/16  UPDATED TARGET 06/15/16, as visits remain in [place] for STGs   Time 4   Period Weeks   Status Achieved     PT SHORT TERM GOAL #2   Title Pt will perform at least 8 of 10 reps of sit<>stand transfers from 18 inch surfaces with minimal UE support and no posterior lean, for improved safety and efficiency of transfers.   Baseline (pt's wife reports transfers not as good at home)   Time 4   Period Weeks   Status Achieved     PT SHORT TERM GOAL #3   Title Pt will improve TUG socre to less than or equal to 23 seconds for decreased fall risk.   Time 4   Period Weeks   Status  Achieved     PT SHORT TERM GOAL #4   Title Pt/wife will verbalize understanding of techniques to decrease festinating/freezing of gait and turns   Baseline Wife verbalizes understanding and provides cues for pt.  Pt able to verbalize stopping and weightshifting.   Time 4   Period Weeks   Status Achieved           PT Long Term Goals - 04/27/16 1224      PT LONG TERM GOAL #1   Title Pt/wife will verbalize understanding of fall prevention within the home.  TARGET 06/26/16   Time 8   Period [place]  Status New     PT LONG TERM GOAL #2   Title Pt will improve Berg Balance score to at least 27/56 for decreased fall risk.   Time 8   Period Weeks   Status New     PT LONG TERM GOAL #3   Title Pt will ambulate at least 750 ft. in 3 minute walk test for improved gait efficiency and safety.   Time 8   Period Weeks   Status New     PT LONG TERM GOAL #4   Title Pt/wife will report at least 25% improvement in turns and in bathroom spaces for decreased fall risk.   Time 8   Status New     PT LONG TERM GOAL #5   Title Pt will verbalize plans for continued community fitness upon D/C from PT.   Time 8   Period Weeks   Status New               Plan - 06/15/16 0914    Clinical Impression Statement Pt overall seems to have improved safety, pacing with gait on outdoor surfaces.  Pt has been provided with comprehensive stretching program, and will benefit from review.  Pt will continue to benefit from skilled PT to address gait, strength, functional mobility.   Rehab Potential Good   PT Frequency 2x / week   PT Duration 8 weeks   PT Treatment/Interventions ADLs/Self Care Home Management;Therapeutic exercise;Therapeutic activities;Functional mobility training;Gait training;Balance training;Neuromuscular re-education;Patient/family education   PT Next Visit Plan Review home program for stretching and strengthening; discuss plans for D/C in coming 1-2 weeks  Joyce and Agree with Plan of Care Patient;Family member/caregiver   Family Member Consulted wife, [name]      Patient will benefit from skilled therapeutic intervention in order to improve the following deficits and impairments:  Abnormal gait, Decreased balance, Decreased activity tolerance, Decreased range of motion, Decreased safety awareness, Decreased mobility, Decreased strength, Postural dysfunction, Impaired flexibility  Visit Diagnosis: Other abnormalities of gait and mobility  Other symptoms and signs involving the nervous system     Problem List Patient Active Problem List   Diagnosis Date Noted  . Hx of adenomatous colonic polyps 01/23/2016  . Edema extremities 01/13/2016  . Lumbar stenosis 06/06/2015  . HOCM (hypertrophic obstructive cardiomyopathy) ([place]) 12/12/2014  . Infection of right prepatellar bursa 10/18/2014  . DOE (dyspnea on exertion) 09/06/2014  . Fatigue due to excessive exertion 09/06/2014  . Testosterone deficiency 11/12/2013  . Routine general medical examination at a health care facility 08/30/2013  . Mixed hyperlipidemia 08/30/2013  . Other abnormal glucose 08/30/2013  . 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 Parkinsonism ([place]) 01/16/2013  . INSOMNIA 06/06/2008  . GLAUCOMA 06/01/2008  . Essential hypertension 06/01/2008  . Seasonal and perennial allergic rhinitis 06/01/2008    Chloris Marcoux W. 06/15/2016, 9:20 AM  Frazier Butt., PT  [facility] [address] [place], [place], [address] Phone: [phone]   Fax:  [phone]  Name: [name] MRN: [id number] Date of Birth: Jun 03, 1946

## 2016-06-19 ENCOUNTER — Ambulatory Visit: Payer: Medicare Other | Admitting: Occupational Therapy

## 2016-06-19 ENCOUNTER — Ambulatory Visit: Payer: Medicare Other | Admitting: Speech Pathology

## 2016-06-19 DIAGNOSIS — R2689 Other abnormalities of gait and mobility: Secondary | ICD-10-CM

## 2016-06-19 DIAGNOSIS — R29898 Other symptoms and signs involving the musculoskeletal system: Secondary | ICD-10-CM

## 2016-06-19 DIAGNOSIS — R471 Dysarthria and anarthria: Secondary | ICD-10-CM

## 2016-06-19 DIAGNOSIS — R29818 Other symptoms and signs involving the nervous system: Secondary | ICD-10-CM

## 2016-06-19 DIAGNOSIS — R41840 Attention and concentration deficit: Secondary | ICD-10-CM | POA: Diagnosis not present

## 2016-06-19 DIAGNOSIS — R278 Other lack of coordination: Secondary | ICD-10-CM | POA: Diagnosis not present

## 2016-06-19 DIAGNOSIS — M6281 Muscle weakness (generalized): Secondary | ICD-10-CM | POA: Diagnosis not present

## 2016-06-19 DIAGNOSIS — R2681 Unsteadiness on feet: Secondary | ICD-10-CM | POA: Diagnosis not present

## 2016-06-19 NOTE — Therapy (Signed)
[facility] [address] [place], [place], [address] Phone: [phone]   Fax:  [phone]  Speech Language Pathology Treatment  Patient Details  Name: [name] MRN: [id number] Date of Birth: 05/06/1946 Referring Provider: Alonza Bogus, DO  Encounter Date: 06/19/2016      End of Session - 06/19/16 1214    Visit Number 8   Number of Visits 9   Date for SLP Re-Evaluation 06/22/16   SLP Start Time 0935   SLP Stop Time  1016   SLP Time Calculation (min) 41 min   Activity Tolerance Patient tolerated treatment well      Past Medical History:  Diagnosis Date  . Allergic rhinitis   . Allergy   . Arthritis   . At risk for falls   . Cancer ([place])    basal cell skin cancer  . Cataract   . Complication of anesthesia    Slow to awaken  . Constipation   . DDD (degenerative disc disease), cervical   . Dysphagia   . Edema extremities 01/13/2016  . GERD (gastroesophageal reflux disease)    Decdreased, has decreased coffee intack.  . Glaucoma   . HTN (hypertension)   . Hx of adenomatous colonic polyps 01/23/2016  . Hyperlipidemia   . Hypogonadism male   . Hypothyroidism   . OSA (obstructive sleep apnea)    Has had surgery x 2  . Parkinsonism ([place]) 01/16/2013   no tremors, balance issues only , speech and swallowing difficulties   . Pre-diabetes   . Sleep apnea    surgery x 2- no cpap  . Staph infection 10-2014  . Thyroid disease    HYPOTHYROID    Past Surgical History:  Procedure Laterality Date  . CATARACT EXTRACTION    . COLONOSCOPY  04-27-2004   tics, hems   . I&D EXTREMITY Right 10/18/2014   Procedure: IRRIGATION AND DEBRIDEMENT RIGHT KNEE PREPATELLA BURSA INFECTION;  Surgeon: [name], MD;  Location: [facility];  Service: Orthopedics;  Laterality: Right;  . [place] SURGERY  04-2015  . LUMBAR LAMINECTOMY WITH COFLEX 1 LEVEL N/A 06/06/2015   Procedure: Lumbar three-four Laminectomy/Foraminotomy with  placement of Coflex;  Surgeon: [name], MD;  Location: [facility];  Service: Neurosurgery;  Laterality: N/A;  . NASAL SEPTUM SURGERY    . TONSILLECTOMY     and adenoids  . UVULOPALATOPHARYNGOPLASTY      There were no vitals filed for this visit.      Subjective Assessment - 06/19/16 1029    Subjective Spouse and pt report that he did homework on Saturday and Sunday but yesterday.    Patient is accompained by: Family member   Currently in Pain? No/denies               ADULT SLP TREATMENT - 06/19/16 1031      General Information   Behavior/Cognition Alert;Cooperative;Pleasant mood;Impulsive     Treatment Provided   Treatment provided Cognitive-Linquistic     Cognitive-Linquistic Treatment   Treatment focused on Dysarthria   Skilled Treatment Facilitated compensation of chunking to improve intellgibility using multiple meaning sentences with usual mod cues. Simple conversation with use of chunking with frequent mod verbal cues - ongoing instruction and modeling.  Also required cueing for breath support to increase volume.     Assessment / Recommendations / Plan   Plan Continue with current plan of care     Progression Toward Goals   Progression toward goals Not progressing  toward goals (comment)          SLP Education - 06/19/16 1051    Education provided Yes   Education Details Compensations for dyarthria   Person(s) Educated Patient;Spouse   Methods Explanation;Demonstration   Comprehension Verbalized understanding;Need further instruction;Verbal cues required;Returned demonstration            SLP Long Term Goals - 06/19/16 1214      SLP LONG TERM GOAL #1   Title pt will demo 85% intelligibility using compensations in 5 minute conversation    Time 1   Period Weeks   Status On-going     SLP LONG TERM GOAL #2   Title pt/wife will tell SLP3 ways to improve pt's communicative effectiveness by manipulating environment for pt's benefit   Time 1    Period Weeks   Status On-going     SLP LONG TERM GOAL #3   Title pt/wife will demo understanding of 4 s/s aspiration PNA   Time 1   Period Weeks   Status On-going          Plan - 06/19/16 1213    Clinical Impression Statement Pt's intellgibility improves at phrase/sentence level when he utilizes chunking and breath support to improve volume. He continues to require ongoing cueing to use reduced rate, chunking and volume. Continue 1 more session for spouse training re: cueing pt for improved intelligibility.  Consider d/c next session as pt will continue to require ongoing cuieng for strategies from family members.    Speech Therapy Frequency 2x / week   Duration 1 week   Treatment/Interventions Diet toleration management by SLP;Aspiration precaution training;SLP instruction and feedback;Compensatory strategies;Language facilitation;Patient/family education;Functional tasks;Cueing hierarchy;Internal/external aids;Environmental controls   Potential to Achieve Goals Fair   Potential Considerations Ability to learn/carryover information;Severity of impairments   Consulted and Agree with Plan of Care Patient;Family member/caregiver   Family Member Consulted spouse      Patient will benefit from skilled therapeutic intervention in order to improve the following deficits and impairments:   Dysarthria and anarthria    Problem List Patient Active Problem List   Diagnosis Date Noted  . Hx of adenomatous colonic polyps 01/23/2016  . Edema extremities 01/13/2016  . Lumbar stenosis 06/06/2015  . HOCM (hypertrophic obstructive cardiomyopathy) ([place]) 12/12/2014  . Infection of right prepatellar bursa 10/18/2014  . DOE (dyspnea on exertion) 09/06/2014  . Fatigue due to excessive exertion 09/06/2014  . Testosterone deficiency 11/12/2013  . Routine general medical examination at a health care facility 08/30/2013  . Mixed hyperlipidemia 08/30/2013  . Other abnormal glucose 08/30/2013  .  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 Parkinsonism ([place]) 01/16/2013  . INSOMNIA 06/06/2008  . GLAUCOMA 06/01/2008  . Essential hypertension 06/01/2008  . Seasonal and perennial allergic rhinitis 06/01/2008    Lovvorn, Annye Rusk MS, CCC-SLP 06/19/2016, 12:15 PM  [facility] [address] [place], [place], [address] Phone: [phone]   Fax:  [phone]   Name: [name] MRN: [id number] Date of Birth: 02-20-1946

## 2016-06-19 NOTE — Patient Instructions (Signed)
Steps for sit to stand  1-Scoot forward to edge of chair 2- Make sure feet are behind knees, 3-Lean forward 4-Stand up SLOWLY  Steps to sit down 1-lean forward 2-reach back 3-Sit in a controlled manner

## 2016-06-19 NOTE — Therapy (Signed)
[facility] [address] [place], [place], [address] Phone: [phone]   Fax:  [phone]  Occupational Therapy Treatment  Patient Details  Name: [name] MRN: [id number] Date of Birth: 23-May-1946 Referring Provider: Dr. Rexene Alberts  Encounter Date: 06/19/2016      OT End of Session - 06/19/16 1702    Visit Number 10   Number of Visits 17   Date for OT Re-Evaluation 07/21/16   Authorization Type Medicare   Authorization - Visit Number 10   Authorization - Number of Visits 10   OT Start Time 0258   OT Stop Time 1015   OT Time Calculation (min) 40 min   Activity Tolerance Patient tolerated treatment well   Behavior During Therapy [facility] for tasks assessed/performed      Past Medical History:  Diagnosis Date  . Allergic rhinitis   . Allergy   . Arthritis   . At risk for falls   . Cancer ([place])    basal cell skin cancer  . Cataract   . Complication of anesthesia    Slow to awaken  . Constipation   . DDD (degenerative disc disease), cervical   . Dysphagia   . Edema extremities 01/13/2016  . GERD (gastroesophageal reflux disease)    Decdreased, has decreased coffee intack.  . Glaucoma   . HTN (hypertension)   . Hx of adenomatous colonic polyps 01/23/2016  . Hyperlipidemia   . Hypogonadism male   . Hypothyroidism   . OSA (obstructive sleep apnea)    Has had surgery x 2  . Parkinsonism ([place]) 01/16/2013   no tremors, balance issues only , speech and swallowing difficulties   . Pre-diabetes   . Sleep apnea    surgery x 2- no cpap  . Staph infection 10-2014  . Thyroid disease    HYPOTHYROID    Past Surgical History:  Procedure Laterality Date  . CATARACT EXTRACTION    . COLONOSCOPY  04-27-2004   tics, hems   . I&D EXTREMITY Right 10/18/2014   Procedure: IRRIGATION AND DEBRIDEMENT RIGHT KNEE PREPATELLA BURSA INFECTION;  Surgeon: [name], MD;  Location: [facility];  Service: Orthopedics;  Laterality: Right;   . [place] SURGERY  04-2015  . LUMBAR LAMINECTOMY WITH COFLEX 1 LEVEL N/A 06/06/2015   Procedure: Lumbar three-four Laminectomy/Foraminotomy with placement of Coflex;  Surgeon: [name], MD;  Location: [facility];  Service: Neurosurgery;  Laterality: N/A;  . NASAL SEPTUM SURGERY    . TONSILLECTOMY     and adenoids  . UVULOPALATOPHARYNGOPLASTY      There were no vitals filed for this visit.      Subjective Assessment - 06/19/16 1647    Subjective  Pt's wife reports pt is doing better with washing his hair   Patient is accompained by: Family member   Pertinent History see Epic   Patient Stated Goals improved ADL performance, decreased stiffness   Currently in Pain? No/denies             Treatment: Therapist checked progress towards short term goals. Therapist reviewed strategies for safe sit / stand with pt and wife and pt practiced multiple times. Therapist reviewed fastening/ unfastening buttons with adapted techniques with pt/ wife.  Pt wrote his name x 2 reps with good size and legibilty. Anticipate d/c next visit.                   OT Short Term Goals - 06/19/16 5277  OT SHORT TERM GOAL #1   Title Pt will perform updated HEP with supervision/min cues.   Time 4   Period Weeks   Status Achieved     OT SHORT TERM GOAL #2   Title Pt will write name with at least 90% legibility at least 2/3 trials   Time 4   Period Weeks   Status Achieved     OT SHORT TERM GOAL #3   Title Pt will report incr ease with washing hair.   Time 4   Period Weeks   Status Partially Met  Pt is performing yet he reports some continued difficulty     OT SHORT TERM GOAL #4   Title Pt will demonstrate increased ease with dressing as evidenced by decreasing 3 button/ unbutton score to 45 secs or less   Time 4   Period Weeks   Status Not Met  1 min 14 secs           OT Long Term Goals - 06/19/16 8338      OT LONG TERM GOAL #1   Title Pt/caregiver will  verbalize understanding of adaptive strategies/AE for ADLs prn.   Time 8   Period Weeks   Status Achieved     OT LONG TERM GOAL #2   Title Pt will demonstrate improved RUE functional use as evidenced by increasing box/ blocks score to 44 blocks   Time 8   Period Weeks   Status On-going     OT LONG TERM GOAL #3   Title Pt will be able to write a simple phrase with at least 90% legibility at least 2/3 trials   Time 8   Period Weeks   Status On-going     OT LONG TERM GOAL #4   Title Pt will be able to brush his hair with min A.   Time 8   Period Weeks   Status Achieved               Plan - 06/19/16 1648    Clinical Impression Statement Pt demonstrates overall progress towards short term goals. Pt can benefit from 1 additional OT visit to complete pt/ caregiver education and check long term goals. Pt's wife has requested d/c at next visit as she is having surgery on Friday of this week.   Rehab Potential Fair   OT Frequency 2x / week   OT Duration 8 weeks   OT Treatment/Interventions Self-care/ADL training;Therapeutic exercise;Functional Mobility Training;Patient/family education;Balance training;Splinting;Manual Therapy;Neuromuscular education;Ultrasound;Energy conservation;Therapeutic exercises;Therapeutic activities;DME and/or AE instruction;Parrafin;Cryotherapy;Electrical Stimulation;Fluidtherapy;Cognitive remediation/compensation;Moist Heat;Contrast Bath;Passive range of motion   Plan reinforce strategies, d/c   OT Home Exercise Plan issued coordination, PWR! hands   Consulted and Agree with Plan of Care Patient;Family member/caregiver   Family Member Consulted wife      Patient will benefit from skilled therapeutic intervention in order to improve the following deficits and impairments:  Abnormal gait, Decreased coordination, Decreased range of motion, Difficulty walking, Impaired flexibility, Impaired sensation, Increased edema, Decreased safety awareness, Decreased  endurance, Decreased activity tolerance, Decreased knowledge of precautions, Impaired tone, Pain, Impaired UE functional use, Decreased knowledge of use of DME, Decreased balance, Decreased cognition, Decreased mobility, Decreased strength, Impaired perceived functional ability  Visit Diagnosis: Other symptoms and signs involving the nervous system  Other symptoms and signs involving the musculoskeletal system  Other lack of coordination  Other abnormalities of gait and mobility    Problem List Patient Active Problem List   Diagnosis Date Noted  . Hx of  adenomatous colonic polyps 01/23/2016  . Edema extremities 01/13/2016  . Lumbar stenosis 06/06/2015  . HOCM (hypertrophic obstructive cardiomyopathy) ([place]) 12/12/2014  . Infection of right prepatellar bursa 10/18/2014  . DOE (dyspnea on exertion) 09/06/2014  . Fatigue due to excessive exertion 09/06/2014  . Testosterone deficiency 11/12/2013  . Routine general medical examination at a health care facility 08/30/2013  . Mixed hyperlipidemia 08/30/2013  . Other abnormal glucose 08/30/2013  . 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 Parkinsonism ([place]) 01/16/2013  . INSOMNIA 06/06/2008  . GLAUCOMA 06/01/2008  . Essential hypertension 06/01/2008  . Seasonal and perennial allergic rhinitis 06/01/2008  Occupational Therapy Progress Note  Dates of Reporting Period: 04/26/16 to 06/19/16  Objective Reports of Subjective Statement: 3 button/nbutton 1 min 14 secs  Objective Measurements: see above  Goal Update:  Pt demonstrates progress towards goals however he has not fully met all goals due to severity of deficits.  Plan: Continue OT for 1 additional visit to address long term goals and complete pt/ caregiver education.  Reason Skilled Services are Required: Pt can benefit from skilled occupational therapy to address: cognitive deficits, decreased balance, decreased coordination, abnormal posture, freezing, in order to maximize  safety and independence with ADLS/IADLS and to maintain quality of life.   [name] 06/19/2016, 5:03 PM [name], [name] [phone] Phone: [phone] 5:09 PM 06/19/16 [name] [address] [place], [place], [address] Phone: [phone]   Fax:  [phone]  Name: [name] MRN: [id number] Date of Birth: [birth year]

## 2016-06-20 ENCOUNTER — Ambulatory Visit: Payer: Medicare Other | Admitting: Physical Therapy

## 2016-06-20 DIAGNOSIS — R29818 Other symptoms and signs involving the nervous system: Secondary | ICD-10-CM | POA: Diagnosis not present

## 2016-06-20 DIAGNOSIS — R2681 Unsteadiness on feet: Secondary | ICD-10-CM | POA: Diagnosis not present

## 2016-06-20 DIAGNOSIS — R278 Other lack of coordination: Secondary | ICD-10-CM | POA: Diagnosis not present

## 2016-06-20 DIAGNOSIS — R41840 Attention and concentration deficit: Secondary | ICD-10-CM | POA: Diagnosis not present

## 2016-06-20 DIAGNOSIS — R2689 Other abnormalities of gait and mobility: Secondary | ICD-10-CM

## 2016-06-20 DIAGNOSIS — R29898 Other symptoms and signs involving the musculoskeletal system: Secondary | ICD-10-CM | POA: Diagnosis not present

## 2016-06-20 DIAGNOSIS — M6281 Muscle weakness (generalized): Secondary | ICD-10-CM | POA: Diagnosis not present

## 2016-06-21 ENCOUNTER — Ambulatory Visit: Payer: Medicare Other

## 2016-06-21 ENCOUNTER — Ambulatory Visit: Payer: Medicare Other | Admitting: Occupational Therapy

## 2016-06-21 DIAGNOSIS — M6281 Muscle weakness (generalized): Secondary | ICD-10-CM | POA: Diagnosis not present

## 2016-06-21 DIAGNOSIS — R4701 Aphasia: Secondary | ICD-10-CM

## 2016-06-21 DIAGNOSIS — R29898 Other symptoms and signs involving the musculoskeletal system: Secondary | ICD-10-CM

## 2016-06-21 DIAGNOSIS — R41840 Attention and concentration deficit: Secondary | ICD-10-CM | POA: Diagnosis not present

## 2016-06-21 DIAGNOSIS — R1312 Dysphagia, oropharyngeal phase: Secondary | ICD-10-CM

## 2016-06-21 DIAGNOSIS — R41841 Cognitive communication deficit: Secondary | ICD-10-CM

## 2016-06-21 DIAGNOSIS — R278 Other lack of coordination: Secondary | ICD-10-CM | POA: Diagnosis not present

## 2016-06-21 DIAGNOSIS — R2681 Unsteadiness on feet: Secondary | ICD-10-CM

## 2016-06-21 DIAGNOSIS — R29818 Other symptoms and signs involving the nervous system: Secondary | ICD-10-CM | POA: Diagnosis not present

## 2016-06-21 DIAGNOSIS — R471 Dysarthria and anarthria: Secondary | ICD-10-CM

## 2016-06-21 NOTE — Patient Instructions (Signed)
You have to practice at least 5 days a week for at least 20 minutes - OUT LOUD!

## 2016-06-21 NOTE — Therapy (Signed)
[facility] [address] [place], [place], [address] Phone: [phone]   Fax:  [phone]  Speech Language Pathology Treatment  Patient Details  Name: [name] MRN: [id number] Date of Birth: 06-Dec-1945 Referring Provider: Alonza Bogus, DO  Encounter Date: 06/21/2016      End of Session - 06/21/16 1524    Visit Number 9   Number of Visits 9   Date for SLP Re-Evaluation 06/22/16   SLP Start Time 0933   SLP Stop Time  1015   SLP Time Calculation (min) 42 min   Activity Tolerance Patient tolerated treatment well      Past Medical History:  Diagnosis Date  . Allergic rhinitis   . Allergy   . Arthritis   . At risk for falls   . Cancer ([place])    basal cell skin cancer  . Cataract   . Complication of anesthesia    Slow to awaken  . Constipation   . DDD (degenerative disc disease), cervical   . Dysphagia   . Edema extremities 01/13/2016  . GERD (gastroesophageal reflux disease)    Decdreased, has decreased coffee intack.  . Glaucoma   . HTN (hypertension)   . Hx of adenomatous colonic polyps 01/23/2016  . Hyperlipidemia   . Hypogonadism male   . Hypothyroidism   . OSA (obstructive sleep apnea)    Has had surgery x 2  . Parkinsonism ([place]) 01/16/2013   no tremors, balance issues only , speech and swallowing difficulties   . Pre-diabetes   . Sleep apnea    surgery x 2- no cpap  . Staph infection 10-2014  . Thyroid disease    HYPOTHYROID    Past Surgical History:  Procedure Laterality Date  . CATARACT EXTRACTION    . COLONOSCOPY  04-27-2004   tics, hems   . I&D EXTREMITY Right 10/18/2014   Procedure: IRRIGATION AND DEBRIDEMENT RIGHT KNEE PREPATELLA BURSA INFECTION;  Surgeon: [name], MD;  Location: [facility];  Service: Orthopedics;  Laterality: Right;  . [place] SURGERY  04-2015  . LUMBAR LAMINECTOMY WITH COFLEX 1 LEVEL N/A 06/06/2015   Procedure: Lumbar three-four Laminectomy/Foraminotomy with  placement of Coflex;  Surgeon: [name], MD;  Location: [facility];  Service: Neurosurgery;  Laterality: N/A;  . NASAL SEPTUM SURGERY    . TONSILLECTOMY     and adenoids  . UVULOPALATOPHARYNGOPLASTY      There were no vitals filed for this visit.      Subjective Assessment - 06/21/16 0936    Patient is accompained by: Family member  wife, [name]   Currently in Pain? No/denies               ADULT SLP TREATMENT - 06/21/16 0945      General Information   Behavior/Cognition Alert;Cooperative;Pleasant mood;Impulsive     Treatment Provided   Treatment provided Cognitive-Linquistic     Cognitive-Linquistic Treatment   Treatment focused on Dysarthria   Skilled Treatment Pt practiced silently yesterday "because it was easier." SLP re-educated pt that he needs to practice out loud. In sentence responses pt req'd simultaneous production for consistent success at improving intelligibility due to significant rushing of speech. Pt's spontaneous verbal answers were chunked or with reduced rate <10% of the time.  SLP cued pt/wife that if pt's conversational partner reduced their rate pt is more apt to reduce his rate of speech. SLP provided examples to demo this. Pt and wife told 3 ways to manipulate the  situation to improve pt's intelligibility.      Assessment / Recommendations / Plan   Plan Discharge SLP treatment due to (comment)  max potential reached at this time     Progression Toward Goals   Progression toward goals --  see goal update          SLP Education - July 07, 2016 1523    Education provided Yes   Education Details reduced speech rate assists pt in reducing his rate   Person(s) Educated Patient;Spouse   Methods Explanation;Demonstration   Comprehension Verbalized understanding            SLP Long Term Goals - 07-07-16 1525      SLP LONG TERM GOAL #1   Title pt will demo 85% intelligibility using compensations in 5 minute conversation    Time 1    Period Weeks   Status Not Met     SLP LONG TERM GOAL #2   Title pt/wife will tell SLP3 ways to improve pt's communicative effectiveness by manipulating environment for pt's benefit   Time 1   Period Weeks   Status Achieved     SLP LONG TERM GOAL #3   Title pt/wife will demo understanding of 4 s/s aspiration PNA   Time 1   Period Weeks   Status Achieved          Plan - 07-Jul-2016 1524    Clinical Impression Statement Pt's intellgibility improves at phrase/sentence level when he utilizes chunking and breath support to improve volume. He continues to require ongoing cueing to use reduced rate, chunking and volume. d/c, appropriate at this time as pt continues to require consistent ongoing cuieng for strategies from family members.    Treatment/Interventions Diet toleration management by SLP;Aspiration precaution training;SLP instruction and feedback;Compensatory strategies;Language facilitation;Patient/family education;Functional tasks;Cueing hierarchy;Internal/external aids;Environmental controls   Potential to Achieve Goals Fair   Potential Considerations Ability to learn/carryover information;Severity of impairments     SPEECH THERAPY DISCHARGE SUMMARY  Visits from Start of Care: 9  Current functional level related to goals / functional outcomes: Pt met goals for compensations, however goals for intelligibility were not met due to persistent, lingering deficits in motor speech ability. SLP focused part of this course of therapy on what wife and pt could do to maximize pt's abilities.   Remaining deficits: See goal update for details.   Education / Equipment: Environmental compensations for incr'd pt intelligibility, chunking and slow speech rate (speech compensations).  Plan: Patient agrees to discharge.  Patient goals were partially met. Patient is being discharged due to                                                     ?????reached max potential for remaining goal/s.         Patient will benefit from skilled therapeutic intervention in order to improve the following deficits and impairments:   Dysarthria and anarthria  Aphasia  Oropharyngeal dysphagia  Cognitive communication deficit      G-Codes - 07-Jul-2016 1527    Functional Assessment Tool Used NOMS - 4 60% impaired   Functional Limitations Motor speech   Motor Speech Goal Status [phone]) At least 40 percent but less than 60 percent impaired, limited or restricted   Motor Speech Goal Status ([id number]) At least 60 percent but less than 80 percent impaired, limited  or restricted      Problem List Patient Active Problem List   Diagnosis Date Noted  . Hx of adenomatous colonic polyps 01/23/2016  . Edema extremities 01/13/2016  . Lumbar stenosis 06/06/2015  . HOCM (hypertrophic obstructive cardiomyopathy) ([place]) 12/12/2014  . Infection of right prepatellar bursa 10/18/2014  . DOE (dyspnea on exertion) 09/06/2014  . Fatigue due to excessive exertion 09/06/2014  . Testosterone deficiency 11/12/2013  . Routine general medical examination at a health care facility 08/30/2013  . Mixed hyperlipidemia 08/30/2013  . Other abnormal glucose 08/30/2013  . 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 Parkinsonism ([place]) 01/16/2013  . INSOMNIA 06/06/2008  . GLAUCOMA 06/01/2008  . Essential hypertension 06/01/2008  . Seasonal and perennial allergic rhinitis 06/01/2008    [facility] ,MS, CCC-SLP  06/21/2016, 3:28 PM  [facility] [address] [place], [place], [address] Phone: [phone]   Fax:  [phone]   Name: [name] MRN: [id number] Date of Birth: [birth year]

## 2016-06-21 NOTE — Therapy (Signed)
[facility] [address] [place], [place], [address] Phone: [phone]   Fax:  [phone]  Occupational Therapy Treatment  Patient Details  Name: [name] MRN: [id number] Date of Birth: 09-22-1946 Referring Provider: Dr. Rexene Alberts  Encounter Date: 06/21/2016      OT End of Session - 06/21/16 1049    Visit Number 11   Number of Visits 17   Authorization Type Medicare   Authorization - Visit Number 11   Authorization - Number of Visits 20   OT Start Time 5170  pt in BR   OT Stop Time 1100   OT Time Calculation (min) 35 min   Activity Tolerance Patient tolerated treatment well   Behavior During Therapy [facility] for tasks assessed/performed      Past Medical History:  Diagnosis Date  . Allergic rhinitis   . Allergy   . Arthritis   . At risk for falls   . Cancer ([place])    basal cell skin cancer  . Cataract   . Complication of anesthesia    Slow to awaken  . Constipation   . DDD (degenerative disc disease), cervical   . Dysphagia   . Edema extremities 01/13/2016  . GERD (gastroesophageal reflux disease)    Decdreased, has decreased coffee intack.  . Glaucoma   . HTN (hypertension)   . Hx of adenomatous colonic polyps 01/23/2016  . Hyperlipidemia   . Hypogonadism male   . Hypothyroidism   . OSA (obstructive sleep apnea)    Has had surgery x 2  . Parkinsonism ([place]) 01/16/2013   no tremors, balance issues only , speech and swallowing difficulties   . Pre-diabetes   . Sleep apnea    surgery x 2- no cpap  . Staph infection 10-2014  . Thyroid disease    HYPOTHYROID    Past Surgical History:  Procedure Laterality Date  . CATARACT EXTRACTION    . COLONOSCOPY  04-27-2004   tics, hems   . I&D EXTREMITY Right 10/18/2014   Procedure: IRRIGATION AND DEBRIDEMENT RIGHT KNEE PREPATELLA BURSA INFECTION;  Surgeon: [name], MD;  Location: [facility];  Service: Orthopedics;  Laterality: Right;  . [place] SURGERY   04-2015  . LUMBAR LAMINECTOMY WITH COFLEX 1 LEVEL N/A 06/06/2015   Procedure: Lumbar three-four Laminectomy/Foraminotomy with placement of Coflex;  Surgeon: [name], MD;  Location: [facility];  Service: Neurosurgery;  Laterality: N/A;  . NASAL SEPTUM SURGERY    . TONSILLECTOMY     and adenoids  . UVULOPALATOPHARYNGOPLASTY      There were no vitals filed for this visit.      Subjective Assessment - 06/21/16 1107    Patient is accompained by: Family member   Pertinent History see Epic   Patient Stated Goals improved ADL performance, decreased stiffness   Currently in Pain? No/denies          Therapist checked progress towards remaining goals. Therapist reviewed handwriting strategies and safety for sit/ stand and ADLs.  Pt/ wife verbalize understanding.                       OT Short Term Goals - 06/19/16 0939      OT SHORT TERM GOAL #1   Title Pt will perform updated HEP with supervision/min cues.   Time 4   Period Weeks   Status Achieved     OT SHORT TERM GOAL #2   Title Pt will write name with  at least 90% legibility at least 2/3 trials   Time 4   Period Weeks   Status Achieved     OT SHORT TERM GOAL #3   Title Pt will report incr ease with washing hair.   Time 4   Period Weeks   Status Partially Met  Pt is performing yet he reports some continued difficulty     OT SHORT TERM GOAL #4   Title Pt will demonstrate increased ease with dressing as evidenced by decreasing 3 button/ unbutton score to 45 secs or less   Time 4   Period Weeks   Status Not Met  1 min 14 secs           OT Long Term Goals - 12-Jul-2016 1026      OT LONG TERM GOAL #1   Title Pt/caregiver will verbalize understanding of adaptive strategies/AE for ADLs prn.   Time 8   Period Weeks   Status Achieved     OT LONG TERM GOAL #2   Title Pt will demonstrate improved RUE functional use as evidenced by increasing box/ blocks score to 44 blocks   Baseline 40   Time  8   Period Weeks   Status Achieved  RUE 48 blocks, LUE 54 blocks     OT LONG TERM GOAL #3   Title Pt will be able to write a simple phrase with at least 90% legibility at least 2/3 trials   Time 8   Period Weeks   Status Partially Met, performs inconsistently     OT LONG TERM GOAL #4   Title Pt will be able to brush his hair with min A.   Time 8   Period Weeks   Status Achieved               Plan - 07/12/16 1108    Clinical Impression Statement Pt/ wife agree with plans for d/c.   Rehab Potential Fair   OT Frequency 2x / week   OT Duration 8 weeks   OT Treatment/Interventions Self-care/ADL training;Therapeutic exercise;Functional Mobility Training;Patient/family education;Balance training;Splinting;Manual Therapy;Neuromuscular education;Ultrasound;Energy conservation;Therapeutic exercises;Therapeutic activities;DME and/or AE instruction;Parrafin;Cryotherapy;Electrical Stimulation;Fluidtherapy;Cognitive remediation/compensation;Moist Heat;Contrast Bath;Passive range of motion   Plan dischare OT   OT Home Exercise Plan issued coordination, PWR! hands   Consulted and Agree with Plan of Care Patient;Family member/caregiver   Family Member Consulted wife      Patient will benefit from skilled therapeutic intervention in order to improve the following deficits and impairments:  Abnormal gait, Decreased coordination, Decreased range of motion, Difficulty walking, Impaired flexibility, Impaired sensation, Increased edema, Decreased safety awareness, Decreased endurance, Decreased activity tolerance, Decreased knowledge of precautions, Impaired tone, Pain, Impaired UE functional use, Decreased knowledge of use of DME, Decreased balance, Decreased cognition, Decreased mobility, Decreased strength, Impaired perceived functional ability  Visit Diagnosis: Other lack of coordination  Other symptoms and signs involving the musculoskeletal system  Attention and concentration  deficit  Unsteadiness on feet  Muscle weakness (generalized)      G-Codes - July 12, 2016 1242    Functional Assessment Tool Used 3 button/ unbutton 1 min 14 secs, clinical impressions Box blocks RUE 48 blocks  LUE 54 blocks   Functional Limitation Self care   Self Care Current Status (H4327) At least 40 percent but less than 60 percent impaired, limited or restricted   Self Care Goal Status (M1470) At least 20 percent but less than 40 percent impaired, limited or restricted    OCCUPATIONAL THERAPY DISCHARGE SUMMARY  Current functional level related to goals / functional outcomes: Pt made progress overall however he did not fully meet goals due to the severity of deficits.   Remaining deficits: Decreased coordination, decreased balance, cognitive deficits, abnormal posture, decreased functional mobility, rigidity, cognitive deficits   Education / Equipment: Pt/ wife were educated regarding adapted strategies for ADLs/IADLs, HEP. Pt / wife verbalize understanding of all education.  Plan: Patient agrees to discharge.  Patient goals were partially met.                                                                                                      ?????      Problem List Patient Active Problem List   Diagnosis Date Noted  . Hx of adenomatous colonic polyps 01/23/2016  . Edema extremities 01/13/2016  . Lumbar stenosis 06/06/2015  . HOCM (hypertrophic obstructive cardiomyopathy) ([place]) 12/12/2014  . Infection of right prepatellar bursa 10/18/2014  . DOE (dyspnea on exertion) 09/06/2014  . Fatigue due to excessive exertion 09/06/2014  . Testosterone deficiency 11/12/2013  . Routine general medical examination at a health care facility 08/30/2013  . Mixed hyperlipidemia 08/30/2013  . Other abnormal glucose 08/30/2013  . 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 Parkinsonism ([place]) 01/16/2013  . INSOMNIA 06/06/2008  . GLAUCOMA 06/01/2008  . Essential hypertension  06/01/2008  . Seasonal and perennial allergic rhinitis 06/01/2008    RINE,KATHRYN 06/21/2016, 12:44 PM  [facility] [address] [place], [place], [address] Phone: [phone]   Fax:  [phone]  Name: Irvan Tiedt MRN: [id number] Date of Birth: [birth year]

## 2016-06-21 NOTE — Therapy (Signed)
[facility] [address] [place], [place], [address] Phone: [phone]   Fax:  [phone]  Physical Therapy Treatment  Patient Details  Name: [name] MRN: [id number] Date of Birth: May 28, 1946 Referring Provider: Rexene Alberts  Encounter Date: 06/20/2016      PT End of Session - 06/21/16 0924    Visit Number 10   Number of Visits 17   Date for PT Re-Evaluation 06/26/16   Authorization Type Medicare/BCBS-GCODE every 10th visit   PT Start Time 0933   PT Stop Time 1015   PT Time Calculation (min) 42 min   Equipment Utilized During Treatment Gait belt   Activity Tolerance Patient tolerated treatment well   Behavior During Therapy [facility] for tasks assessed/performed      Past Medical History:  Diagnosis Date  . Allergic rhinitis   . Allergy   . Arthritis   . At risk for falls   . Cancer ([place])    basal cell skin cancer  . Cataract   . Complication of anesthesia    Slow to awaken  . Constipation   . DDD (degenerative disc disease), cervical   . Dysphagia   . Edema extremities 01/13/2016  . GERD (gastroesophageal reflux disease)    Decdreased, has decreased coffee intack.  . Glaucoma   . HTN (hypertension)   . Hx of adenomatous colonic polyps 01/23/2016  . Hyperlipidemia   . Hypogonadism male   . Hypothyroidism   . OSA (obstructive sleep apnea)    Has had surgery x 2  . Parkinsonism ([place]) 01/16/2013   no tremors, balance issues only , speech and swallowing difficulties   . Pre-diabetes   . Sleep apnea    surgery x 2- no cpap  . Staph infection 10-2014  . Thyroid disease    HYPOTHYROID    Past Surgical History:  Procedure Laterality Date  . CATARACT EXTRACTION    . COLONOSCOPY  04-27-2004   tics, hems   . I&D EXTREMITY Right 10/18/2014   Procedure: IRRIGATION AND DEBRIDEMENT RIGHT KNEE PREPATELLA BURSA INFECTION;  Surgeon: [name], MD;  Location: [facility];  Service: Orthopedics;  Laterality: Right;  .  [place] SURGERY  04-2015  . LUMBAR LAMINECTOMY WITH COFLEX 1 LEVEL N/A 06/06/2015   Procedure: Lumbar three-four Laminectomy/Foraminotomy with placement of Coflex;  Surgeon: [name], MD;  Location: [facility];  Service: Neurosurgery;  Laterality: N/A;  . NASAL SEPTUM SURGERY    . TONSILLECTOMY     and adenoids  . UVULOPALATOPHARYNGOPLASTY      There were no vitals filed for this visit.      Subjective Assessment - 06/20/16 0931    Subjective PT fell yesterday in the den (wife unaware); reports hurting his shoulder, but he can move it.   Patient is accompained by: Family member  wife   Patient Stated Goals Pt's goals for therapy is to improve balance and strengthening.   Currently in Pain? No/denies            [facility] PT Assessment - 06/20/16 0949      Berg Balance Test   Sit to Stand Able to stand  independently using hands   Standing Unsupported Able to stand safely 2 minutes   Sitting with Back Unsupported but Feet Supported on Floor or Stool Able to sit safely and securely 2 minutes   Stand to Sit Controls descent by using hands   Transfers Able to transfer with verbal cueing and /or supervision  Standing Unsupported with Eyes Closed Able to stand 10 seconds with supervision   Standing Ubsupported with Feet Together Needs help to attain position but able to stand for 30 seconds with feet together   From Standing, Reach Forward with Outstretched Arm Reaches forward but needs supervision   From Standing Position, Pick up Object from Floor Unable to try/needs assist to keep balance   From Standing Position, Turn to Look Behind Over each Shoulder Turn sideways only but maintains balance   Turn 360 Degrees Needs assistance while turning   Standing Unsupported, Alternately Place Feet on Step/Stool Needs assistance to keep from falling or unable to try   Standing Unsupported, One Foot in [place] to take small step independently and hold 30 seconds   Standing on One Leg  Unable to try or needs assist to prevent fall   Total Score 25   Berg comment: increased from 22/56                     [facility] Adult PT Treatment/Exercise - 06/20/16 0949      Transfers   Transfers Sit to Stand;Stand to Sit   Sit to Stand 5: Supervision;With upper extremity assist;From chair/3-in-1   Sit to Stand Details (indicate cue type and reason) Needs cues and supervision for slowed pace and sequence of transfers   Stand to Sit 5: Supervision;With upper extremity assist;To chair/3-in-1   Number of Reps Other reps (comment)  5 reps through course of session     Ambulation/Gait   Ambulation/Gait Yes   Ambulation/Gait Assistance 4: Min guard;5: Supervision   Ambulation/Gait Assistance Details Cues for posture and staying close to walker BOS.   Ambulation Distance (Feet) 490 Feet  in 3 minutes; 980 ft in 6 minutes   Assistive device Rolling walker   Gait Pattern Step-through pattern;Decreased step length - left;Decreased dorsiflexion - right;Right foot flat;Trunk flexed   Ambulation Surface Level;Indoor     Self-Care   Self-Care Other Self-Care Comments   Other Self-Care Comments  Discussed fall prevention information-provided handout, and reiterated general safety awareness and need for supervision whenever walking in the home.  Discussed continued fitness-pt no longer able to use machines at [facility] due to safety concerns, so PT reiterates importance of continuing comprehensive HEP for home.  Discussed continued fall risk; progress to goals and plans for discharge.                PT Education - 06/21/16 [phone]    Education provided Yes   Education Details Fall prevention, safety in the home, continued fitness options   Person(s) Educated Patient;Spouse   Methods Explanation;Handout   Comprehension Verbalized understanding;Verbal cues required          PT Short Term Goals - 06/12/16 1114      PT SHORT TERM GOAL #1   Title Pt will perform HEP for  strengthening, balance and gait with wife's supervision.  TARGET 05/26/16  UPDATED TARGET 06/15/16, as visits remain in [place] for STGs   Time 4   Period Weeks   Status Achieved     PT SHORT TERM GOAL #2   Title Pt will perform at least 8 of 10 reps of sit<>stand transfers from 18 inch surfaces with minimal UE support and no posterior lean, for improved safety and efficiency of transfers.   Baseline (pt's wife reports transfers not as good at home)   Time 4   Period Weeks   Status Achieved     PT  SHORT TERM GOAL #3   Title Pt will improve TUG socre to less than or equal to 23 seconds for decreased fall risk.   Time 4   Period Weeks   Status Achieved     PT SHORT TERM GOAL #4   Title Pt/wife will verbalize understanding of techniques to decrease festinating/freezing of gait and turns   Baseline Wife verbalizes understanding and provides cues for pt.  Pt able to verbalize stopping and weightshifting.   Time 4   Period Weeks   Status Achieved           PT Long Term Goals - 07-08-16 5631      PT LONG TERM GOAL #1   Title Pt/wife will verbalize understanding of fall prevention within the home.  TARGET 06/26/16   Baseline Wife verbalizes understanding.   Time 8   Period Weeks   Status Partially Met     PT LONG TERM GOAL #2   Title Pt will improve Berg Balance score to at least 27/56 for decreased fall risk.   Baseline 25/56 2016/07/08   Time 8   Period Weeks   Status Not Met     PT LONG TERM GOAL #3   Title Pt will ambulate at least 750 ft. in 3 minute walk test for improved gait efficiency and safety.   Baseline 490 ft in 3 minutes (980 ft in 6 minutes)   Time 8   Period Weeks   Status Not Met     PT LONG TERM GOAL #4   Title Pt/wife will report at least 25% improvement in turns and in bathroom spaces for decreased fall risk.   Baseline Pt needs constant supervision for safety in home/bathroom spaces.   Time 8   Status Partially Met     PT LONG TERM GOAL #5   Title Pt  will verbalize plans for continued community fitness upon D/C from PT.   Baseline Pt unable to use machines at [facility] due to safety concerns.  To continue HEP at home with family assistance.   Time 8   Period Weeks   Status Achieved               Plan - 06/21/16 4970    Clinical Impression Statement LTG #1, 4 partially met.  LTG #2, 3 not met.  LTG #5 met.  Pt has been extensively educated in fall prevention, safety, HEP, and wife continues to have to provide cues.  Pt continues to have decreased safety awareness and impulsivity withmobility in the home.  Pt is appropriate for d/c at this time.   Rehab Potential Good   PT Frequency 2x / week   PT Duration 8 weeks   PT Treatment/Interventions ADLs/Self Care Home Management;Therapeutic exercise;Therapeutic activities;Functional mobility training;Gait training;Balance training;Neuromuscular re-education;Patient/family education   PT Next Visit Plan Discharge this visit  [place] and Agree with Plan of Care Patient;Family member/caregiver   Family Member Consulted wife, [name]      Patient will benefit from skilled therapeutic intervention in order to improve the following deficits and impairments:  Abnormal gait, Decreased balance, Decreased activity tolerance, Decreased range of motion, Decreased safety awareness, Decreased mobility, Decreased strength, Postural dysfunction, Impaired flexibility  Visit Diagnosis: Other abnormalities of gait and mobility  Other symptoms and signs involving the nervous system  Unsteadiness on feet       G-Codes - 07-08-16 0927    Functional Assessment Tool Used Berg 25/56; 490 ft in3 minutes, 980  ft in 6 minutes   Functional Limitation Mobility: Walking and moving around   Mobility: Walking and Moving Around Goal Status [phone]) At least 20 percent but less than 40 percent impaired, limited or restricted   Mobility: Walking and Moving Around Discharge Status [phone]) At least 40  percent but less than 60 percent impaired, limited or restricted      Problem List Patient Active Problem List   Diagnosis Date Noted  . Hx of adenomatous colonic polyps 01/23/2016  . Edema extremities 01/13/2016  . Lumbar stenosis 06/06/2015  . HOCM (hypertrophic obstructive cardiomyopathy) ([place]) 12/12/2014  . Infection of right prepatellar bursa 10/18/2014  . DOE (dyspnea on exertion) 09/06/2014  . Fatigue due to excessive exertion 09/06/2014  . Testosterone deficiency 11/12/2013  . Routine general medical examination at a health care facility 08/30/2013  . Mixed hyperlipidemia 08/30/2013  . Other abnormal glucose 08/30/2013  . 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 Parkinsonism ([place]) 01/16/2013  . INSOMNIA 06/06/2008  . GLAUCOMA 06/01/2008  . Essential hypertension 06/01/2008  . Seasonal and perennial allergic rhinitis 06/01/2008    Eustacio Ellen W. 06/21/2016, 11:55 AM  Frazier Butt., PT  [facility] [address] [place], [place], [address] Phone: [phone]   Fax:  [phone]  Name: [name] MRN: [id number] Date of Birth: March 24, 1946   PHYSICAL THERAPY DISCHARGE SUMMARY  Visits from Start of Care: 10  Current functional level related to goals / functional outcomes:     PT Long Term Goals - 06/20/16 0939      PT LONG TERM GOAL #1   Title Pt/wife will verbalize understanding of fall prevention within the home.  TARGET 06/26/16   Baseline Wife verbalizes understanding.   Time 8   Period Weeks   Status Partially Met     PT LONG TERM GOAL #2   Title Pt will improve Berg Balance score to at least 27/56 for decreased fall risk.   Baseline 25/56 06/20/16   Time 8   Period Weeks   Status Not Met     PT LONG TERM GOAL #3   Title Pt will ambulate at least 750 ft. in 3 minute walk test for improved gait efficiency and safety.   Baseline 490 ft in 3 minutes (980 ft in 6 minutes)   Time 8    Period Weeks   Status Not Met     PT LONG TERM GOAL #4   Title Pt/wife will report at least 25% improvement in turns and in bathroom spaces for decreased fall risk.   Baseline Pt needs constant supervision for safety in home/bathroom spaces.   Time 8   Status Partially Met     PT LONG TERM GOAL #5   Title Pt will verbalize plans for continued community fitness upon D/C from PT.   Baseline Pt unable to use machines at [facility] due to safety concerns.  To continue HEP at home with family assistance.   Time 8   Period Weeks   Status Achieved      Short term goals were previously assessed, with pt meeting all STGs.     Remaining deficits: Decreased balance, decreased safety awareness with standing and gait, transfer activities   Education / Equipment: Educated in [employer], fall prevention, safety in the home.  Plan: Patient agrees to discharge.  Patient goals were partially met. Patient is being discharged due to                                                     ?????  maximizing rehab potential at this time.        Mady Haagensen, PT 06/21/16 11:57 AM Phone: [phone] Fax: [phone]

## 2016-06-23 DIAGNOSIS — Z23 Encounter for immunization: Secondary | ICD-10-CM | POA: Diagnosis not present

## 2016-06-25 ENCOUNTER — Ambulatory Visit: Payer: Medicare Other | Admitting: Physical Therapy

## 2016-06-25 ENCOUNTER — Encounter: Payer: Medicare Other | Admitting: Occupational Therapy

## 2016-06-27 ENCOUNTER — Encounter: Payer: Medicare Other | Admitting: Occupational Therapy

## 2016-07-14 ENCOUNTER — Other Ambulatory Visit: Payer: Self-pay | Admitting: Family Medicine

## 2016-08-28 DIAGNOSIS — N3946 Mixed incontinence: Secondary | ICD-10-CM | POA: Diagnosis not present

## 2016-08-28 DIAGNOSIS — N319 Neuromuscular dysfunction of bladder, unspecified: Secondary | ICD-10-CM | POA: Diagnosis not present

## 2016-10-18 ENCOUNTER — Other Ambulatory Visit: Payer: Self-pay

## 2016-10-18 ENCOUNTER — Ambulatory Visit (INDEPENDENT_AMBULATORY_CARE_PROVIDER_SITE_OTHER): Payer: Medicare Other

## 2016-10-18 VITALS — BP 116/68 | HR 50 | Ht 71.0 in | Wt 200.5 lb

## 2016-10-18 DIAGNOSIS — G20 Parkinson's disease: Secondary | ICD-10-CM

## 2016-10-18 DIAGNOSIS — Z0000 Encounter for general adult medical examination without abnormal findings: Secondary | ICD-10-CM

## 2016-10-18 NOTE — Progress Notes (Signed)
I have reviewed and agree with note, evaluation, plan.   Elbert Polyakov, MD  

## 2016-10-18 NOTE — Progress Notes (Signed)
PT/ST/OT recommended by physical therapist. Dr. Rexene Alberts agrees, orders placed.

## 2016-10-18 NOTE — Patient Instructions (Addendum)
[name] , Thank you for taking time to come for your Medicare Wellness Visit. I appreciate your ongoing commitment to your health goals. Please review the following plan we discussed and let me know if I can assist you in the future.   These are the goals we discussed:/ going to [facility] to to maintain!!!! Goals    None      This is a list of the screening recommended for you and due dates:  Health Maintenance  Topic Date Due  . Colon Cancer Screening  01/16/2019  . Tetanus Vaccine  07/08/2020  . Flu Shot  Completed  . Shingles Vaccine  Completed  .  Hepatitis C: One time screening is recommended by Center for Disease Control  (CDC) for  adults born from [birth year] through 1964.   Completed  . Pneumonia vaccines  Completed      Fall Prevention in the Home Introduction Falls can cause injuries. They can happen to people of all ages. There are many things you can do to make your home safe and to help prevent falls. What can I do on the outside of my home?  Regularly fix the edges of walkways and driveways and fix any cracks.  Remove anything that might make you trip as you walk through a door, such as a raised step or threshold.  Trim any bushes or trees on the path to your home.  Use bright outdoor lighting.  Clear any walking paths of anything that might make someone trip, such as rocks or tools.  Regularly check to see if handrails are loose or broken. Make sure that both sides of any steps have handrails.  Any raised decks and porches should have guardrails on the edges.  Have any leaves, snow, or ice cleared regularly.  Use sand or salt on walking paths during winter.  Clean up any spills in your garage right away. This includes oil or grease spills. What can I do in the bathroom?  Use night lights.  Install grab bars by the toilet and in the tub and shower. Do not use towel bars as grab bars.  Use non-skid mats or decals in the tub or shower.  If you need to sit  down in the shower, use a plastic, non-slip stool.  Keep the floor dry. Clean up any water that spills on the floor as soon as it happens.  Remove soap buildup in the tub or shower regularly.  Attach bath mats securely with double-sided non-slip rug tape.  Do not have throw rugs and other things on the floor that can make you trip. What can I do in the bedroom?  Use night lights.  Make sure that you have a light by your bed that is easy to reach.  Do not use any sheets or blankets that are too big for your bed. They should not hang down onto the floor.  Have a firm chair that has side arms. You can use this for support while you get dressed.  Do not have throw rugs and other things on the floor that can make you trip. What can I do in the kitchen?  Clean up any spills right away.  Avoid walking on wet floors.  Keep items that you use a lot in easy-to-reach places.  If you need to reach something above you, use a strong step stool that has a grab bar.  Keep electrical cords out of the way.  Do not use floor polish or wax that  makes floors slippery. If you must use wax, use non-skid floor wax.  Do not have throw rugs and other things on the floor that can make you trip. What can I do with my stairs?  Do not leave any items on the stairs.  Make sure that there are handrails on both sides of the stairs and use them. Fix handrails that are broken or loose. Make sure that handrails are as long as the stairways.  Check any carpeting to make sure that it is firmly attached to the stairs. Fix any carpet that is loose or worn.  Avoid having throw rugs at the top or bottom of the stairs. If you do have throw rugs, attach them to the floor with carpet tape.  Make sure that you have a light switch at the top of the stairs and the bottom of the stairs. If you do not have them, ask someone to add them for you. What else can I do to help prevent falls?  Wear shoes that:  Do not have  high heels.  Have rubber bottoms.  Are comfortable and fit you well.  Are closed at the toe. Do not wear sandals.  If you use a stepladder:  Make sure that it is fully opened. Do not climb a closed stepladder.  Make sure that both sides of the stepladder are locked into place.  Ask someone to hold it for you, if possible.  Clearly mark and make sure that you can see:  Any grab bars or handrails.  First and last steps.  Where the edge of each step is.  Use tools that help you move around (mobility aids) if they are needed. These include:  Canes.  Walkers.  Scooters.  Crutches.  Turn on the lights when you go into a dark area. Replace any light bulbs as soon as they burn out.  Set up your furniture so you have a clear path. Avoid moving your furniture around.  If any of your floors are uneven, fix them.  If there are any pets around you, be aware of where they are.  Review your medicines with your doctor. Some medicines can make you feel dizzy. This can increase your chance of falling. Ask your doctor what other things that you can do to help prevent falls. This information is not intended to replace advice given to you by your health care provider. Make sure you discuss any questions you have with your health care provider. Document Released: 07/21/2009 Document Revised: 03/01/2016 Document Reviewed: 10/29/2014  2017 Elsevier  Health Maintenance, Male A healthy lifestyle and preventative care can promote health and wellness.  Maintain regular health, dental, and eye exams.  Eat a healthy diet. Foods like vegetables, fruits, whole grains, low-fat dairy products, and lean protein foods contain the nutrients you need and are low in calories. Decrease your intake of foods high in solid fats, added sugars, and salt. Get information about a proper diet from your health care provider, if necessary.  Regular physical exercise is one of the most important things you can  do for your health. Most adults should get at least 150 minutes of moderate-intensity exercise (any activity that increases your heart rate and causes you to sweat) each week. In addition, most adults need muscle-strengthening exercises on 2 or more days a week.   Maintain a healthy weight. The body mass index (BMI) is a screening tool to identify possible weight problems. It provides an estimate of body fat based on  height and weight. Your health care provider can find your BMI and can help you achieve or maintain a healthy weight. For males 20 years and older:  A BMI below 18.5 is considered underweight.  A BMI of 18.5 to 24.9 is normal.  A BMI of 25 to 29.9 is considered overweight.  A BMI of 30 and above is considered obese.  Maintain normal blood lipids and cholesterol by exercising and minimizing your intake of saturated fat. Eat a balanced diet with plenty of fruits and vegetables. Blood tests for lipids and cholesterol should begin at age 44 and be repeated every 5 years. If your lipid or cholesterol levels are high, you are over age 69, or you are at high risk for heart disease, you may need your cholesterol levels checked more frequently.Ongoing high lipid and cholesterol levels should be treated with medicines if diet and exercise are not working.  If you smoke, find out from your health care provider how to quit. If you do not use tobacco, do not start.  Lung cancer screening is recommended for adults aged 49-80 years who are at high risk for developing lung cancer because of a history of smoking. A yearly low-dose CT scan of the lungs is recommended for people who have at least a 30-pack-year history of smoking and are current smokers or have quit within the past 15 years. A pack year of smoking is smoking an average of 1 pack of cigarettes a day for 1 year (for example, a 30-pack-year history of smoking could mean smoking 1 pack a day for 30 years or 2 packs a day for 15 years).  Yearly screening should continue until the smoker has stopped smoking for at least 15 years. Yearly screening should be stopped for people who develop a health problem that would prevent them from having lung cancer treatment.  If you choose to drink alcohol, do not have more than 2 drinks per day. One drink is considered to be 12 oz (360 mL) of beer, 5 oz (150 mL) of wine, or 1.5 oz (45 mL) of liquor.  Avoid the use of street drugs. Do not share needles with anyone. Ask for help if you need support or instructions about stopping the use of drugs.  High blood pressure causes heart disease and increases the risk of stroke. High blood pressure is more likely to develop in:  People who have blood pressure in the end of the normal range (100-139/85-89 mm Hg).  People who are overweight or obese.  People who are African American.  If you are 37-42 years of age, have your blood pressure checked every 3-5 years. If you are 81 years of age or older, have your blood pressure checked every year. You should have your blood pressure measured twice-once when you are at a hospital or clinic, and once when you are not at a hospital or clinic. Record the average of the two measurements. To check your blood pressure when you are not at a hospital or clinic, you can use:  An automated blood pressure machine at a pharmacy.  A home blood pressure monitor.  If you are 72-70 years old, ask your health care provider if you should take aspirin to prevent heart disease.  Diabetes screening involves taking a blood sample to check your fasting blood sugar level. This should be done once every 3 years after age 70 if you are at a normal weight and without risk factors for diabetes. Testing should be considered  at a younger age or be carried out more frequently if you are overweight and have at least 1 risk factor for diabetes.  Colorectal cancer can be detected and often prevented. Most routine colorectal cancer  screening begins at the age of 40 and continues through age 28. However, your health care provider may recommend screening at an earlier age if you have risk factors for colon cancer. On a yearly basis, your health care provider may provide home test kits to check for hidden blood in the stool. A small camera at the end of a tube may be used to directly examine the colon (sigmoidoscopy or colonoscopy) to detect the earliest forms of colorectal cancer. Talk to your health care provider about this at age 23 when routine screening begins. A direct exam of the colon should be repeated every 5-10 years through age 51, unless early forms of precancerous polyps or small growths are found.  People who are at an increased risk for hepatitis B should be screened for this virus. You are considered at high risk for hepatitis B if:  You were born in a country where hepatitis B occurs often. Talk with your health care provider about which countries are considered high risk.  Your parents were born in a high-risk country and you have not received a shot to protect against hepatitis B (hepatitis B vaccine).  You have HIV or AIDS.  You use needles to inject street drugs.  You live with, or have sex with, someone who has hepatitis B.  You are a man who has sex with other men (MSM).  You get hemodialysis treatment.  You take certain medicines for conditions like cancer, organ transplantation, and autoimmune conditions.  Hepatitis C blood testing is recommended for all people born from [birth year] through 1964 and any individual with known risk factors for hepatitis C.  Healthy men should no longer receive prostate-specific antigen (PSA) blood tests as part of routine cancer screening. Talk to your health care provider about prostate cancer screening.  Testicular cancer screening is not recommended for adolescents or adult males who have no symptoms. Screening includes self-exam, a health care provider exam, and other  screening tests. Consult with your health care provider about any symptoms you have or any concerns you have about testicular cancer.  Practice safe sex. Use condoms and avoid high-risk sexual practices to reduce the spread of sexually transmitted infections (STIs).  You should be screened for STIs, including gonorrhea and chlamydia if:  You are sexually active and are younger than 24 years.  You are older than 24 years, and your health care provider tells you that you are at risk for this type of infection.  Your sexual activity has changed since you were last screened, and you are at an increased risk for chlamydia or gonorrhea. Ask your health care provider if you are at risk.  If you are at risk of being infected with HIV, it is recommended that you take a prescription medicine daily to prevent HIV infection. This is called pre-exposure prophylaxis (PrEP). You are considered at risk if:  You are a man who has sex with other men (MSM).  You are a heterosexual man who is sexually active with multiple partners.  You take drugs by injection.  You are sexually active with a partner who has HIV.  Talk with your health care provider about whether you are at high risk of being infected with HIV. If you choose to begin PrEP, you should first  be tested for HIV. You should then be tested every 3 months for as long as you are taking PrEP.  Use sunscreen. Apply sunscreen liberally and repeatedly throughout the day. You should seek shade when your shadow is shorter than you. Protect yourself by wearing long sleeves, pants, a wide-brimmed hat, and sunglasses year round whenever you are outdoors.  Tell your health care provider of new moles or changes in moles, especially if there is a change in shape or color. Also, tell your health care provider if a mole is larger than the size of a pencil eraser.  A one-time screening for abdominal aortic aneurysm (AAA) and surgical repair of large AAAs by  ultrasound is recommended for men aged 2-75 years who are current or former smokers.  Stay current with your vaccines (immunizations). This information is not intended to replace advice given to you by your health care provider. Make sure you discuss any questions you have with your health care provider. Document Released: 03/22/2008 Document Revised: 10/15/2014 Document Reviewed: 06/28/2015 Elsevier Interactive Patient Education  2017 [employer].

## 2016-10-18 NOTE — Progress Notes (Signed)
Subjective:   [name] is a 71 y.o. male who presents for Medicare Annual/Subsequent preventive examination.  The Patient was informed that the wellness visit is to identify future health risk and educate and initiate measures that can reduce risk for increased disease through the lifespan.    NO ROS; Medicare Wellness Visit/ due fup with Dr. 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 Fair    The following written screening schedule of preventive measures were reviewed with assessment and plan made per below and patient instructions:  Smoking history reviewed and he never smoked   ETOH none  Lipids chol 185; HDL 38; LDL 108 and trig 191 / drawn 09/2015 DM 95/ A1c 5.2    RISK FACTORS Regular exercise- Trying exercises that PT at home. Wife continues to encourage  Has shoulder pain from using grab bars etc to get up and down; c/o of neuropathy as well   Diet;  Breakfast; oatmeal; biscuit;  Cereal; scrambled eggs States he eats well but is very limited Wife fixes food that he can swallow   Will modify diet some but generally  98% of regular diet; no nuts; chips, peanut butter  He has had several swallowing test;  Holds water in his mouth   Fall risk: consistently;  Stated multiple times; no w/c  Discussed rational for w/c States he falls once a  Month; much better since he agreed to his walker; Wife has belt for safety    Still mobile; but with assistance. Safety; wife states community is very good; Both dtr can care for him and understands his needs as one will be moving back to [place] soon. Has help from neighbors as needed as well. Wife just finished up chemo and radiation for breast cancer and  dtr assisted with needs; last tx was in Sept 2016 but doing well now.    The patient will laugh occasional and states he has PBA   Depression Screen The patient denies; states he is still hopeful PhQ 2: negative  Activities of Daily Living - See functional screen  Assisted by spouse or other but states he can still take a shower with chair; wife lays he clothes out and he dresses   Cognitive testing; Defer to [facility] [place] treatment team Serial 7's from 100 x 5 Recall 2/3 and 3/3 with cuing Feels his recall is not as good    Advanced Directives has been completed   Patient Care Team: [name], MD as PCP - General (Internal Medicine) [facility]; neurology Dr. Ellene Route neurosurgery   Preventives screens reviewed and none are due  Colonoscopy 01/2016 and due 047/2020  PSA - deferred    Immunization History  Administered Date(s) Administered  . Influenza Whole 08/29/2010, 07/09/2011, 07/21/2012  . Influenza, High Dose Seasonal PF 07/24/2013, 07/15/2015, 06/23/2016  . Influenza-Unspecified 07/23/2014  . Pneumococcal Conjugate-13 05/13/2014  . Pneumococcal Polysaccharide-23 10/08/2002, 08/12/2015  . Tdap 07/08/2010  . Zoster 07/09/2007   Required Immunizations needed today  Screening test up to date or reviewed for plan of completion There are no preventive care reminders to display for this patient.          Objective:  Vitals: BP 116/68   Pulse (!) 50   Ht 5\' 11"  (1.803 m)   Wt 200 lb 8 oz (90.9 kg)   SpO2 97%   BMI 27.96 kg/m   Body mass index is 27.96 kg/m.  Tobacco History  Smoking Status  . Never Smoker  Smokeless Tobacco  . Never Used     Counseling given: Yes   Past Medical History:  Diagnosis Date  . Allergic rhinitis   . Allergy   . Arthritis   . At risk for falls   . Cancer ([place])    basal cell skin cancer  . Cataract   . Complication of anesthesia    Slow to awaken  . Constipation   . DDD (degenerative disc disease),  cervical   . Dysphagia   . Edema extremities 01/13/2016  . GERD (gastroesophageal reflux disease)    Decdreased, has decreased coffee intack.  . Glaucoma   . HTN (hypertension)   . Hx of adenomatous colonic polyps 01/23/2016  . Hyperlipidemia   . Hypogonadism male   . Hypothyroidism   . OSA (obstructive sleep apnea)    Has had surgery x 2  . Parkinsonism ([place]) 01/16/2013   no tremors, balance issues only , speech and swallowing difficulties   . Pre-diabetes   . Sleep apnea    surgery x 2- no cpap  . Staph infection 10-2014  . Thyroid disease    HYPOTHYROID   Past Surgical History:  Procedure Laterality Date  . CATARACT EXTRACTION    . COLONOSCOPY  04-27-2004   tics, hems   . I&D EXTREMITY Right 10/18/2014   Procedure: IRRIGATION AND DEBRIDEMENT RIGHT KNEE PREPATELLA BURSA INFECTION;  Surgeon: [name], MD;  Location: [place];  Service: Orthopedics;  Laterality: Right;  . [place] SURGERY  04-2015  . LUMBAR LAMINECTOMY WITH COFLEX 1 LEVEL N/A 06/06/2015   Procedure: Lumbar three-four Laminectomy/Foraminotomy with placement of Coflex;  Surgeon: [name], MD;  Location: [place] NEURO ORS;  Service: Neurosurgery;  Laterality: N/A;  . NASAL SEPTUM SURGERY    . TONSILLECTOMY     and adenoids  . UVULOPALATOPHARYNGOPLASTY     Family History  Problem Relation Age of Onset  . Emphysema Mother   . COPD Mother   . Heart disease Mother   . Hypertension Mother   . Liver cancer Father   . Cancer Father   . Diabetes Father   . Hypertension Father   . Colon polyps Father   . Heart disease Sister   . COPD Sister   . Heart attack Sister   . Hypertension Sister   . Colon polyps Brother   . Stroke Neg Hx   . Colon cancer Neg Hx    History  Sexual Activity  . Sexual activity: Not on file    Outpatient Encounter Prescriptions as of 10/18/2016  Medication Sig  . aspirin 81 MG tablet Take 81 mg by mouth daily.  . Cholecalciferol (VITAMIN D3) 10000 UNITS capsule Take 5,000 Units by  mouth daily. Mon. ,[id number] weekly  . finasteride (PROSCAR) 5 MG tablet Take 5 mg by mouth daily.  . fluticasone (FLONASE) 50 MCG/ACT nasal spray USE 1-2 SPRAYS IN EACH NOSTRIL DAILY  . levothyroxine (SYNTHROID, LEVOTHROID) 50 MCG tablet TAKE 1 TABLET BY MOUTH EVERY DAY  . loratadine (CLARITIN) 10 MG tablet Take 10 mg by mouth daily.  Marland Kitchen losartan (COZAAR) 100 MG tablet TAKE 1 TABLET BY MOUTH DAILY  . Multiple Vitamins-Minerals (MULTIVITAMIN WITH MINERALS) tablet Take  1 tablet by mouth daily.    . mupirocin ointment (BACTROBAN) 2 % Reported on 01/02/2016  . Naproxen Sodium (ALEVE) 220 MG CAPS Take by mouth.  . nebivolol (BYSTOLIC) 2.5 MG tablet Take 1 tablet (2.5 mg total) by mouth daily.  . Omega-3 Fatty Acids (FISH OIL) 1000 MG CAPS Take 1,200 mg by mouth daily. Pt takes 2400 mg on Tues, Thurs and Sat.  . timolol (BETIMOL) 0.5 % ophthalmic solution 1 drop 2 (two) times daily.  Marland Kitchen linaclotide (LINZESS) 290 MCG CAPS capsule Take 1 capsule (290 mcg total) by mouth daily before breakfast. (Patient not taking: Reported on 10/18/2016)   No facility-administered encounter medications on file as of 10/18/2016.     Activities of Daily Living In your present state of health, do you have any difficulty performing the following activities: 10/18/2016  Hearing? N  Vision? N  Difficulty concentrating or making decisions? (No Data)  Walking or climbing stairs? Y  Dressing or bathing? Y  Doing errands, shopping? Y  Preparing Food and eating ? Y  Using the Toilet? Y  In the past six months, have you accidently leaked urine? Y  Do you have problems with loss of bowel control? (No Data)  Managing your Medications? N  Managing your Finances? N  Housekeeping or managing your Housekeeping? N  Some recent data might be hidden    Patient Care Team: [name], MD as PCP - General (Internal Medicine)   Assessment:     Exercise Activities and Dietary recommendations    Goals    None      Fall Risk Fall Risk  10/18/2016 04/30/2016 10/31/2015 09/30/2015 09/28/2014  Falls in the past year? Yes Yes Yes Yes Yes  Number falls in past yr: 2 or more 2 or more 2 or more 2 or more 2 or more  Injury with Fall? - No Yes Yes -  Risk Factor Category  - High Fall Risk High Fall Risk - High Fall Risk  Risk for fall due to : - - Impaired balance/gait;Impaired mobility Impaired balance/gait Impaired balance/gait  Follow up Education provided Falls prevention discussed Falls prevention discussed - -  Some recent data might be hidden   Depression Screen PHQ 2/9 Scores 10/18/2016 09/30/2015 09/28/2014 11/12/2013  PHQ - 2 Score 0 0 0 0  Some recent data might be hidden    Cognitive Function MMSE - Mini Mental State Exam 04/30/2016 10/31/2015 11/18/2014  Orientation to time 5 5 5   Orientation to Place 5 5 5   Registration 3 3 3   Attention/ Calculation 3 5 5   Recall 3 3 3   Language- name 2 objects 2 2 2   Language- repeat 1 1 1   Language- follow 3 step command 3 3 3   Language- read & follow direction 1 1 1   Write a sentence 1 1 1   Copy design 1 1 1   Total score 28 30 30   Some recent data might be hidden        Immunization History  Administered Date(s) Administered  . Influenza Whole 08/29/2010, 07/09/2011, 07/21/2012  . Influenza, High Dose Seasonal PF 07/24/2013, 07/15/2015, 06/23/2016  . Influenza-Unspecified 07/23/2014  . Pneumococcal Conjugate-13 05/13/2014  . Pneumococcal Polysaccharide-23 10/08/2002, 08/12/2015  . Tdap 07/08/2010  . Zoster 07/09/2007   Screening Tests Health Maintenance  Topic Date Due  . COLONOSCOPY  01/16/2019  . TETANUS/TDAP  07/08/2020  . INFLUENZA VACCINE  Completed  . ZOSTAVAX  Completed  . Hepatitis C Screening  Completed  . PNA  vac Low Risk Adult  Completed      Plan:     He and spouse excited about going to the movement clinic [facility] for evaluation on Jan 24th.  Otherwise, managing and hoping to get better.    Good support system    Preventive care up to date   During the course of the visit the patient was educated and counseled about the following appropriate screening and preventive services:   Vaccines to include Pneumoccal, Influenza, Hepatitis B, Td, Zostavax, HCV  Electrocardiogram  Cardiovascular Disease  Colorectal cancer screening  Diabetes screening  Prostate Cancer Screening  Glaucoma screening  Nutrition counseling   Smoking cessation counseling  Patient Instructions (the written plan) was given to the patient.    Wynetta Fines, RN  10/18/2016

## 2016-10-22 ENCOUNTER — Other Ambulatory Visit: Payer: Self-pay | Admitting: Internal Medicine

## 2016-10-31 ENCOUNTER — Ambulatory Visit: Payer: Medicare Other | Admitting: Neurology

## 2016-10-31 DIAGNOSIS — G231 Progressive supranuclear ophthalmoplegia [Steele-Richardson-Olszewski]: Secondary | ICD-10-CM | POA: Diagnosis not present

## 2016-11-02 DIAGNOSIS — G231 Progressive supranuclear ophthalmoplegia [Steele-Richardson-Olszewski]: Secondary | ICD-10-CM | POA: Diagnosis not present

## 2016-11-08 ENCOUNTER — Encounter: Payer: Self-pay | Admitting: Internal Medicine

## 2016-11-08 ENCOUNTER — Ambulatory Visit (INDEPENDENT_AMBULATORY_CARE_PROVIDER_SITE_OTHER): Payer: Medicare Other | Admitting: Internal Medicine

## 2016-11-08 VITALS — BP 120/70 | HR 60 | Temp 98.0°F | Ht 71.0 in | Wt 201.2 lb

## 2016-11-08 DIAGNOSIS — E559 Vitamin D deficiency, unspecified: Secondary | ICD-10-CM

## 2016-11-08 DIAGNOSIS — E119 Type 2 diabetes mellitus without complications: Secondary | ICD-10-CM | POA: Diagnosis not present

## 2016-11-08 DIAGNOSIS — G20 Parkinson's disease: Secondary | ICD-10-CM | POA: Diagnosis not present

## 2016-11-08 DIAGNOSIS — G231 Progressive supranuclear ophthalmoplegia [Steele-Richardson-Olszewski]: Secondary | ICD-10-CM | POA: Diagnosis not present

## 2016-11-08 DIAGNOSIS — E538 Deficiency of other specified B group vitamins: Secondary | ICD-10-CM

## 2016-11-08 DIAGNOSIS — T733XXD Exhaustion due to excessive exertion, subsequent encounter: Secondary | ICD-10-CM

## 2016-11-08 DIAGNOSIS — I10 Essential (primary) hypertension: Secondary | ICD-10-CM

## 2016-11-08 DIAGNOSIS — Z0000 Encounter for general adult medical examination without abnormal findings: Secondary | ICD-10-CM

## 2016-11-08 DIAGNOSIS — G6289 Other specified polyneuropathies: Secondary | ICD-10-CM

## 2016-11-08 LAB — TSH: TSH: 2.06 mIU/L (ref 0.40–4.50)

## 2016-11-08 NOTE — Patient Instructions (Addendum)

## 2016-11-08 NOTE — Progress Notes (Signed)
Pre visit review using our clinic review tool, if applicable. No additional management support is needed unless otherwise documented below in the visit note. 

## 2016-11-08 NOTE — Progress Notes (Signed)
Subjective:    Patient ID: [name], male    DOB: [birth year], 71 y.o.   MRN: [id number]  HPI  71 year old patient presents with a three-day history of largely nonproductive cough. He is followed at [facility] neurology with Parkinson's disease/possible PSP.  He has had a recent brain MRI.  Laboratory panel requested by [facility] neurology.  Denies any fever, wheezing or shortness of breath.  He has been resumed on Sinemet which has caused some drowsiness  Past Medical History:  Diagnosis Date  . Allergic rhinitis   . Allergy   . Arthritis   . At risk for falls   . Cancer ([place])    basal cell skin cancer  . Cataract   . Complication of anesthesia    Slow to awaken  . Constipation   . DDD (degenerative disc disease), cervical   . Dysphagia   . Edema extremities 01/13/2016  . GERD (gastroesophageal reflux disease)    Decdreased, has decreased coffee intack.  . Glaucoma   . HTN (hypertension)   . Hx of adenomatous colonic polyps 01/23/2016  . Hyperlipidemia   . Hypogonadism male   . Hypothyroidism   . OSA (obstructive sleep apnea)    Has had surgery x 2  . Parkinsonism ([place]) 01/16/2013   no tremors, balance issues only , speech and swallowing difficulties   . Pre-diabetes   . Sleep apnea    surgery x 2- no cpap  . Staph infection 10-2014  . Thyroid disease    HYPOTHYROID     Social History   Social History  . Marital status: Married    Spouse name: [name]  . Number of children: 2  . Years of education: college   Occupational History  . branch Librarian, academic   .  Retired   Social History Main Topics  . Smoking status: Never Smoker  . Smokeless tobacco: Never Used  . Alcohol use No  . Drug use: No  . Sexual activity: Not on file   Other Topics Concern  . Not on file   Social History Narrative  . No narrative on file    Past Surgical History:  Procedure Laterality Date  . CATARACT EXTRACTION    . COLONOSCOPY  04-27-2004   tics, hems   . I&D EXTREMITY Right  10/18/2014   Procedure: IRRIGATION AND DEBRIDEMENT RIGHT KNEE PREPATELLA BURSA INFECTION;  Surgeon: [name], MD;  Location: [place];  Service: Orthopedics;  Laterality: Right;  . [place] SURGERY  04-2015  . LUMBAR LAMINECTOMY WITH COFLEX 1 LEVEL N/A 06/06/2015   Procedure: Lumbar three-four Laminectomy/Foraminotomy with placement of Coflex;  Surgeon: [name], MD;  Location: [place] NEURO ORS;  Service: Neurosurgery;  Laterality: N/A;  . NASAL SEPTUM SURGERY    . TONSILLECTOMY     and adenoids  . UVULOPALATOPHARYNGOPLASTY      Family History  Problem Relation Age of Onset  . Emphysema Mother   . COPD Mother   . Heart disease Mother   . Hypertension Mother   . Liver cancer Father   . Cancer Father   . Diabetes Father   . Hypertension Father   . Colon polyps Father   . Heart disease Sister   . COPD Sister   . Heart attack Sister   . Hypertension Sister   . Colon polyps Brother   . Stroke Neg Hx   . Colon cancer Neg Hx     Allergies  Allergen Reactions  . Ambien [Zolpidem Tartrate]  Unknown- balance  . Atenolol     unknown  . Lunesta [Eszopiclone]     unknown  . Oxazepam     unknown  . Requip [Ropinirole Hcl]     unknown  . Ziac [Bisoprolol-Hydrochlorothiazide]     unknown    Current Outpatient Prescriptions on File Prior to Visit  Medication Sig Dispense Refill  . aspirin 81 MG tablet Take 81 mg by mouth daily.    . Cholecalciferol (VITAMIN D3) 10000 UNITS capsule Take 5,000 Units by mouth daily. Mon. ,[id number] weekly    . finasteride (PROSCAR) 5 MG tablet Take 5 mg by mouth daily.  11  . fluticasone (FLONASE) 50 MCG/ACT nasal spray USE 1-2 SPRAYS IN EACH NOSTRIL DAILY 16 g 6  . levothyroxine (SYNTHROID, LEVOTHROID) 50 MCG tablet TAKE 1 TABLET BY MOUTH EVERY DAY 30 tablet 0  . linaclotide (LINZESS) 290 MCG CAPS capsule Take 1 capsule (290 mcg total) by mouth daily before breakfast. 30 capsule 6  . loratadine (CLARITIN) 10 MG tablet Take 10 mg by mouth  daily.    Marland Kitchen losartan (COZAAR) 100 MG tablet TAKE 1 TABLET BY MOUTH DAILY 30 tablet 5  . Multiple Vitamins-Minerals (MULTIVITAMIN WITH MINERALS) tablet Take 1 tablet by mouth daily.      . mupirocin ointment (BACTROBAN) 2 % Reported on 01/02/2016  3  . Naproxen Sodium (ALEVE) 220 MG CAPS Take by mouth.    . nebivolol (BYSTOLIC) 2.5 MG tablet Take 1 tablet (2.5 mg total) by mouth daily. 30 tablet 11  . Omega-3 Fatty Acids (FISH OIL) 1000 MG CAPS Take 1,200 mg by mouth daily. Pt takes 2400 mg on Tues, Thurs and Sat.    . timolol (BETIMOL) 0.5 % ophthalmic solution 1 drop 2 (two) times daily.     No current facility-administered medications on file prior to visit.     BP 120/70 (BP Location: Left Arm, Patient Position: Sitting, Cuff Size: Normal)   Pulse 60   Temp 98 F (36.7 C) (Oral)   Ht 5\' 11"  (1.803 m)   Wt 201 lb 3.2 oz (91.3 kg)   BMI 28.06 kg/m     Review of Systems  Constitutional: Positive for activity change, appetite change and fatigue. Negative for chills and fever.  HENT: Positive for congestion. Negative for dental problem, ear pain, hearing loss, sore throat, tinnitus, trouble swallowing and voice change.   Eyes: Negative for pain, discharge and visual disturbance.  Respiratory: Positive for cough. Negative for chest tightness, wheezing and stridor.   Cardiovascular: Negative for chest pain, palpitations and leg swelling.  Gastrointestinal: Negative for abdominal distention, abdominal pain, blood in stool, constipation, diarrhea, nausea and vomiting.  Genitourinary: Negative for difficulty urinating, discharge, flank pain, genital sores, hematuria and urgency.  Musculoskeletal: Negative for arthralgias, back pain, gait problem, joint swelling, myalgias and neck stiffness.  Skin: Negative for rash.  Neurological: Negative for dizziness, syncope, speech difficulty, weakness, numbness and headaches.  Hematological: Negative for adenopathy. Does not bruise/bleed easily.    Psychiatric/Behavioral: Negative for behavioral problems and dysphoric mood. The patient is not nervous/anxious.        Objective:   Physical Exam  Constitutional: He is oriented to person, place, and time. He appears well-developed.  Afebrile No distress  HENT:  Head: Normocephalic.  Right Ear: External ear normal.  Left Ear: External ear normal.  Eyes: Conjunctivae and EOM are normal.  Neck: Normal range of motion.  Cardiovascular: Normal rate and normal heart sounds.   Pulmonary/Chest: Breath sounds  normal. No respiratory distress. He has no wheezes. He has no rales.  Abdominal: Bowel sounds are normal.  Musculoskeletal: Normal range of motion. He exhibits no edema or tenderness.  Neurological: He is alert and oriented to person, place, and time.  Psychiatric: He has a normal mood and affect. His behavior is normal.          Assessment & Plan:   Viral URI with cough.  Will treat symptomatically Laboratory update per [facility] neurology  Call or return to clinic prn if these symptoms worsen or fail to improve as anticipated.  Nyoka Cowden

## 2016-11-09 LAB — VITAMIN D 25 HYDROXY (VIT D DEFICIENCY, FRACTURES): Vit D, 25-Hydroxy: 31 ng/mL (ref 30–100)

## 2016-11-09 LAB — ANTI-NUCLEAR AB-TITER (ANA TITER): ANA Titer 1: 1:160 {titer} — ABNORMAL HIGH

## 2016-11-09 LAB — FOLATE

## 2016-11-09 LAB — HEMOGLOBIN A1C
Hgb A1c MFr Bld: 5 % (ref <5.7)
MEAN PLASMA GLUCOSE: 97 mg/dL

## 2016-11-09 LAB — VITAMIN B12: Vitamin B-12: 433 pg/mL (ref 200–1100)

## 2016-11-09 LAB — RHEUMATOID FACTOR: Rhuematoid fact SerPl-aCnc: <14 IU/mL (ref <14)

## 2016-11-09 LAB — SEDIMENTATION RATE: Sed Rate: 11 mm/hr (ref 0–20)

## 2016-11-09 LAB — ANA: ANA: POSITIVE — AB

## 2016-11-12 LAB — PROTEIN ELECTROPHORESIS, SERUM
ALBUMIN ELP: 4.2 g/dL (ref 3.8–4.8)
ALPHA-1-GLOBULIN: 0.3 g/dL (ref 0.2–0.3)
Alpha-2-Globulin: 0.7 g/dL (ref 0.5–0.9)
Beta 2: 0.4 g/dL (ref 0.2–0.5)
Beta Globulin: 0.4 g/dL (ref 0.4–0.6)
GAMMA GLOBULIN: 0.9 g/dL (ref 0.8–1.7)
TOTAL PROTEIN, SERUM ELECTROPHOR: 6.8 g/dL (ref 6.1–8.1)

## 2016-11-12 LAB — METHYLMALONIC ACID, SERUM: METHYLMALONIC ACID, QUANT: 156 nmol/L (ref 87–318)

## 2016-11-12 LAB — IMMUNOFIXATION ELECTROPHORESIS
IGG (IMMUNOGLOBIN G), SERUM: 919 mg/dL (ref 694–1618)
IgA: 215 mg/dL (ref 81–463)
IgM, Serum: 92 mg/dL (ref 48–271)

## 2016-11-13 ENCOUNTER — Encounter: Payer: Medicare Other | Admitting: Occupational Therapy

## 2016-11-13 ENCOUNTER — Ambulatory Visit: Payer: Medicare Other | Admitting: Physical Therapy

## 2016-11-14 ENCOUNTER — Encounter: Payer: Self-pay | Admitting: Internal Medicine

## 2016-11-14 ENCOUNTER — Ambulatory Visit (INDEPENDENT_AMBULATORY_CARE_PROVIDER_SITE_OTHER): Payer: Medicare Other | Admitting: Internal Medicine

## 2016-11-14 VITALS — BP 112/52 | HR 51 | Temp 97.6°F | Ht 71.0 in | Wt 199.6 lb

## 2016-11-14 DIAGNOSIS — G609 Hereditary and idiopathic neuropathy, unspecified: Secondary | ICD-10-CM | POA: Diagnosis not present

## 2016-11-14 DIAGNOSIS — I10 Essential (primary) hypertension: Secondary | ICD-10-CM | POA: Diagnosis not present

## 2016-11-14 DIAGNOSIS — G629 Polyneuropathy, unspecified: Secondary | ICD-10-CM | POA: Insufficient documentation

## 2016-11-14 NOTE — Progress Notes (Signed)
Subjective:    Patient ID: [name], male    DOB: [birth year], 71 y.o.   MRN: [id number]  HPI  71 year old patient who is followed by [facility] neurology with PD/PSP who was seen last week.  Laboratory testing to evaluate for peripheral neuropathy requested. Lab largely unremarkable except for positive ANA of unclear significance Discussed with patient and wife; copies of lab provided and copy mailed to [facility] neurology Patient has a history of essential hypertension He was seen last week for cough that has improved  Past Medical History:  Diagnosis Date  . Allergic rhinitis   . Allergy   . Arthritis   . At risk for falls   . Cancer ([place])    basal cell skin cancer  . Cataract   . Complication of anesthesia    Slow to awaken  . Constipation   . DDD (degenerative disc disease), cervical   . Dysphagia   . Edema extremities 01/13/2016  . GERD (gastroesophageal reflux disease)    Decdreased, has decreased coffee intack.  . Glaucoma   . HTN (hypertension)   . Hx of adenomatous colonic polyps 01/23/2016  . Hyperlipidemia   . Hypogonadism male   . Hypothyroidism   . OSA (obstructive sleep apnea)    Has had surgery x 2  . Parkinsonism ([place]) 01/16/2013   no tremors, balance issues only , speech and swallowing difficulties   . Pre-diabetes   . Sleep apnea    surgery x 2- no cpap  . Staph infection 10-2014  . Thyroid disease    HYPOTHYROID     Social History   Social History  . Marital status: Married    Spouse name: linda  . Number of children: 2  . Years of education: college   Occupational History  . branch Librarian, academic   .  Retired   Social History Main Topics  . Smoking status: Never Smoker  . Smokeless tobacco: Never Used  . Alcohol use No  . Drug use: No  . Sexual activity: Not on file   Other Topics Concern  . Not on file   Social History Narrative  . No narrative on file    Past Surgical History:  Procedure Laterality Date  . CATARACT EXTRACTION     . COLONOSCOPY  04-27-2004   tics, hems   . I&D EXTREMITY Right 10/18/2014   Procedure: IRRIGATION AND DEBRIDEMENT RIGHT KNEE PREPATELLA BURSA INFECTION;  Surgeon: [name], MD;  Location: [place];  Service: Orthopedics;  Laterality: Right;  . [place] SURGERY  04-2015  . LUMBAR LAMINECTOMY WITH COFLEX 1 LEVEL N/A 06/06/2015   Procedure: Lumbar three-four Laminectomy/Foraminotomy with placement of Coflex;  Surgeon: [name], MD;  Location: [place] NEURO ORS;  Service: Neurosurgery;  Laterality: N/A;  . NASAL SEPTUM SURGERY    . TONSILLECTOMY     and adenoids  . UVULOPALATOPHARYNGOPLASTY      Family History  Problem Relation Age of Onset  . Emphysema Mother   . COPD Mother   . Heart disease Mother   . Hypertension Mother   . Liver cancer Father   . Cancer Father   . Diabetes Father   . Hypertension Father   . Colon polyps Father   . Heart disease Sister   . COPD Sister   . Heart attack Sister   . Hypertension Sister   . Colon polyps Brother   . Stroke Neg Hx   . Colon cancer Neg Hx     Allergies  Allergen Reactions  . Ambien [Zolpidem Tartrate]     Unknown- balance  . Atenolol     unknown  . Lunesta [Eszopiclone]     unknown  . Oxazepam     unknown  . Requip [Ropinirole Hcl]     unknown  . Ziac [Bisoprolol-Hydrochlorothiazide]     unknown    Current Outpatient Prescriptions on File Prior to Visit  Medication Sig Dispense Refill  . aspirin 81 MG tablet Take 81 mg by mouth daily.    . Cholecalciferol (VITAMIN D3) 10000 UNITS capsule Take 5,000 Units by mouth daily. Mon. ,[id number] weekly    . finasteride (PROSCAR) 5 MG tablet Take 5 mg by mouth daily.  11  . fluticasone (FLONASE) 50 MCG/ACT nasal spray USE 1-2 SPRAYS IN EACH NOSTRIL DAILY 16 g 6  . levothyroxine (SYNTHROID, LEVOTHROID) 50 MCG tablet TAKE 1 TABLET BY MOUTH EVERY DAY 30 tablet 0  . linaclotide (LINZESS) 290 MCG CAPS capsule Take 1 capsule (290 mcg total) by mouth daily before breakfast. 30  capsule 6  . loratadine (CLARITIN) 10 MG tablet Take 10 mg by mouth daily.    Marland Kitchen losartan (COZAAR) 100 MG tablet TAKE 1 TABLET BY MOUTH DAILY 30 tablet 5  . Multiple Vitamins-Minerals (MULTIVITAMIN WITH MINERALS) tablet Take 1 tablet by mouth daily.      . mupirocin ointment (BACTROBAN) 2 % Reported on 01/02/2016  3  . Naproxen Sodium (ALEVE) 220 MG CAPS Take by mouth.    . nebivolol (BYSTOLIC) 2.5 MG tablet Take 1 tablet (2.5 mg total) by mouth daily. 30 tablet 11  . Omega-3 Fatty Acids (FISH OIL) 1000 MG CAPS Take 1,200 mg by mouth daily. Pt takes 2400 mg on Tues, Thurs and Sat.    . timolol (BETIMOL) 0.5 % ophthalmic solution 1 drop 2 (two) times daily.     No current facility-administered medications on file prior to visit.     BP (!) 112/52 (BP Location: Right Arm, Patient Position: Sitting, Cuff Size: Normal)   Pulse (!) 51   Temp 97.6 F (36.4 C) (Oral)   Ht 5\' 11"  (1.803 m)   Wt 199 lb 9.6 oz (90.5 kg)   SpO2 98%   BMI 27.84 kg/m     Review of Systems  Constitutional: Positive for fatigue. Negative for appetite change, chills and fever.  HENT: Negative for congestion, dental problem, ear pain, hearing loss, sore throat, tinnitus, trouble swallowing and voice change.   Eyes: Negative for pain, discharge and visual disturbance.  Respiratory: Positive for cough. Negative for chest tightness, wheezing and stridor.   Cardiovascular: Negative for chest pain, palpitations and leg swelling.  Gastrointestinal: Negative for abdominal distention, abdominal pain, blood in stool, constipation, diarrhea, nausea and vomiting.  Genitourinary: Negative for difficulty urinating, discharge, flank pain, genital sores, hematuria and urgency.  Musculoskeletal: Positive for gait problem. Negative for arthralgias, back pain, joint swelling, myalgias and neck stiffness.  Skin: Negative for rash.  Neurological: Positive for weakness and numbness. Negative for dizziness, syncope, speech difficulty and  headaches.  Hematological: Negative for adenopathy. Does not bruise/bleed easily.  Psychiatric/Behavioral: Negative for behavioral problems and dysphoric mood. The patient is not nervous/anxious.        Objective:   Physical Exam  Constitutional: He appears well-developed and well-nourished. No distress.  Parkinsonian facies Bradykinesia Blood pressure low normal O2 saturation 98%  HENT:  Mouth/Throat: Oropharynx is clear and moist.  Eyes: Conjunctivae and EOM are normal. Pupils are equal, round, and reactive to light.  Neck: Neck supple.  Cardiovascular: Normal rate and regular rhythm.   Pulmonary/Chest: Effort normal and breath sounds normal. No respiratory distress. He has no wheezes. He has no rales.          Assessment & Plan:   Resolving viral URI with cough Peripheral neuropathy.  Follow-up [facility] neurology Positive anti-nuclear antibody.  Unclear significance.  Follow-up [facility] neurology Essential hypertension, well-controlled  Follow-up here 6 months or as needed  Nyoka Cowden

## 2016-11-14 NOTE — Patient Instructions (Signed)
Follow-up [facility] neurology as scheduled  Take a calcium supplement, plus [phone] units of vitamin D  Return in 6 months for follow-up

## 2016-11-14 NOTE — Progress Notes (Signed)
Pre visit review using our clinic review tool, if applicable. No additional management support is needed unless otherwise documented below in the visit note. 

## 2016-11-19 ENCOUNTER — Encounter: Payer: Self-pay | Admitting: Neurology

## 2016-11-19 ENCOUNTER — Ambulatory Visit (INDEPENDENT_AMBULATORY_CARE_PROVIDER_SITE_OTHER): Payer: Medicare Other | Admitting: Neurology

## 2016-11-19 VITALS — BP 117/75 | HR 50 | Resp 20 | Ht 70.0 in | Wt 200.0 lb

## 2016-11-19 DIAGNOSIS — G20 Parkinson's disease: Secondary | ICD-10-CM | POA: Diagnosis not present

## 2016-11-19 DIAGNOSIS — R296 Repeated falls: Secondary | ICD-10-CM | POA: Diagnosis not present
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
## 2016-11-19 NOTE — Patient Instructions (Addendum)
Recurrent falls are your major risk!   Hydrate well and eat right. Use your walker at all times.   Keep taking the Sinemet CR with the titration recommended by Dr. Nicki Reaper.

## 2016-11-21 ENCOUNTER — Other Ambulatory Visit: Payer: Self-pay | Admitting: Internal Medicine

## 2016-11-30 ENCOUNTER — Other Ambulatory Visit: Payer: Self-pay | Admitting: Internal Medicine

## 2016-12-06 ENCOUNTER — Ambulatory Visit: Payer: Medicare Other

## 2016-12-06 ENCOUNTER — Ambulatory Visit: Payer: Medicare Other | Admitting: Occupational Therapy

## 2016-12-06 ENCOUNTER — Ambulatory Visit: Payer: Medicare Other | Attending: Neurology | Admitting: Physical Therapy

## 2016-12-06 DIAGNOSIS — R29898 Other symptoms and signs involving the musculoskeletal system: Secondary | ICD-10-CM

## 2016-12-06 DIAGNOSIS — R471 Dysarthria and anarthria: Secondary | ICD-10-CM | POA: Diagnosis not present

## 2016-12-06 DIAGNOSIS — R29818 Other symptoms and signs involving the nervous system: Secondary | ICD-10-CM

## 2016-12-06 DIAGNOSIS — R41841 Cognitive communication deficit: Secondary | ICD-10-CM | POA: Insufficient documentation

## 2016-12-06 DIAGNOSIS — M25511 Pain in right shoulder: Secondary | ICD-10-CM

## 2016-12-06 DIAGNOSIS — R41840 Attention and concentration deficit: Secondary | ICD-10-CM | POA: Insufficient documentation

## 2016-12-06 DIAGNOSIS — R2689 Other abnormalities of gait and mobility: Secondary | ICD-10-CM | POA: Diagnosis not present

## 2016-12-06 DIAGNOSIS — R4701 Aphasia: Secondary | ICD-10-CM

## 2016-12-06 DIAGNOSIS — R41844 Frontal lobe and executive function deficit: Secondary | ICD-10-CM | POA: Insufficient documentation

## 2016-12-06 DIAGNOSIS — R1312 Dysphagia, oropharyngeal phase: Secondary | ICD-10-CM | POA: Insufficient documentation

## 2016-12-06 DIAGNOSIS — M25611 Stiffness of right shoulder, not elsewhere classified: Secondary | ICD-10-CM

## 2016-12-06 DIAGNOSIS — M6281 Muscle weakness (generalized): Secondary | ICD-10-CM

## 2016-12-06 DIAGNOSIS — R2681 Unsteadiness on feet: Secondary | ICD-10-CM

## 2016-12-06 DIAGNOSIS — R278 Other lack of coordination: Secondary | ICD-10-CM | POA: Insufficient documentation

## 2016-12-06 NOTE — Patient Instructions (Signed)
I think at this time it may be good for you to have another swallow evaluation. I will contact Dr. Carles Collet and see what she thinks. We will likely follow up with that aspect of therapy and maybe target some specific phrases or sentences you'd like to practice now to make them easier to say later on.

## 2016-12-06 NOTE — Therapy (Signed)
[facility] [address] [place], [place], [address] Phone: [phone]   Fax:  [phone]  Physical Therapy Evaluation  Patient Details  Name: [name] MRN: [id number] Date of Birth: [birth year] Referring Provider: Rexene Schaefer  Encounter Date: 12/06/2016      PT End of Session - 12/06/16 1942    Visit Number 1   Number of Visits 17   Date for PT Re-Evaluation 02/04/17   Authorization Type Medicare Primary, BCBS second-GCODE every 10th visit   PT Start Time 1024   PT Stop Time 1100   PT Time Calculation (min) 36 min   Equipment Utilized During Treatment Gait belt   Activity Tolerance Patient tolerated treatment well   Behavior During Therapy Impulsive  Continues to ask about using an outdoor (rollator) walker      Past Medical History:  Diagnosis Date  . Allergic rhinitis   . Allergy   . Arthritis   . At risk for falls   . Cancer ([place])    basal cell skin cancer  . Cataract   . Complication of anesthesia    Slow to awaken  . Constipation   . DDD (degenerative disc disease), cervical   . Dysphagia   . Edema extremities 01/13/2016  . GERD (gastroesophageal reflux disease)    Decdreased, has decreased coffee intack.  . Glaucoma   . HTN (hypertension)   . Hx of adenomatous colonic polyps 01/23/2016  . Hyperlipidemia   . Hypogonadism male   . Hypothyroidism   . OSA (obstructive sleep apnea)    Has had surgery x 2  . Parkinsonism ([place]) 01/16/2013   no tremors, balance issues only , speech and swallowing difficulties   . Pre-diabetes   . Sleep apnea    surgery x 2- no cpap  . Staph infection 10-2014  . Thyroid disease    HYPOTHYROID    Past Surgical History:  Procedure Laterality Date  . CATARACT EXTRACTION    . COLONOSCOPY  04-27-2004   tics, hems   . I&D EXTREMITY Right 10/18/2014   Procedure: IRRIGATION AND DEBRIDEMENT RIGHT KNEE PREPATELLA BURSA INFECTION;  Surgeon: [name], MD;  Location: [facility];  Service: Orthopedics;  Laterality: Right;  . [place] SURGERY  04-2015  . LUMBAR LAMINECTOMY WITH COFLEX 1 LEVEL N/A 06/06/2015   Procedure: Lumbar three-four Laminectomy/Foraminotomy with placement of Coflex;  Surgeon: [name], MD;  Location: [facility];  Service: Neurosurgery;  Laterality: N/A;  . NASAL SEPTUM SURGERY    . TONSILLECTOMY     and adenoids  . UVULOPALATOPHARYNGOPLASTY      There were no vitals filed for this visit.       Subjective Assessment - 12/06/16 1027    Subjective Pt and wife present to OP therapy.  Doctor at [facility] started on Sinemet, built-up dosage for almost a month, but felt very swimmy-headed and made balance worse, so we stopped taking it.  Feels worse as far as mobility.  Wife feels he's lost muscle tone in backs of legs.   Pertinent History Neuropathy, Parkinson's/PSP   Patient Stated Goals Pt's goal for therapy is to help with walking, getting up and down out of chair and bed   Currently in Pain? No/denies            [name] PT Assessment - 12/06/16 0001      Assessment   Medical Diagnosis Parkinson's/PSP   Referring Provider [name]   Onset Date/Surgical Date 11/19/16  MD neurologist  visit     Precautions   Precautions Fall     Balance Screen   Has the patient fallen in the past 6 months Yes   How many times? 6   Has the patient had a decrease in activity level because of a fear of falling?  Yes   Is the patient reluctant to leave their home because of a fear of falling?  Yes     [place] Private residence   Living Arrangements Spouse/significant other   Available Help at Discharge Family   Type of [place] to enter   Entrance Stairs-Number of Steps 3   Entrance Stairs-Rails Right   [place] Two level;Able to live on main level with bedroom/bathroom   [place] - 2 wheels;Cane - single point     Prior Function   Level of Independence Requires assistive  device for independence;Needs assistance with ADLs   Comments Does supine exercises every morning and neck exercises     Observation/Other Assessments   Focus on Therapeutic Outcomes (FOTO)  NA     Posture/Postural Control   Posture/Postural Control Postural limitations   Postural Limitations Forward head;Rounded Shoulders;Posterior pelvic tilt     ROM / Strength   AROM / PROM / Strength Strength;AROM;PROM     AROM   Overall AROM Comments R ankle dorsiflexion lacks 20 degrees from neutral; P/ROM -5 degrees from neutral     Strength   Overall Strength Deficits   Overall Strength Comments Grossly tested 4/5 bilateral hip flexors, quads, hamstrings, then L ankle dorsiflexion 3+/5, R ankle dorsiflexion 3-/5,      Transfers   Transfers Sit to Stand;Stand to Sit   Sit to Stand 5: Supervision;4: Min guard;From chair/3-in-1   Sit to Stand Details (indicate cue type and reason) Pt demo decreased forward lean and pushes chair back with backs of lower extremities   Five time sit to stand comments  24.09   using min UE support    Stand to Sit 5: Supervision;4: Min guard;With upper extremity assist;To chair/3-in-1;Uncontrolled descent   Stand to Sit Details Pt has uncontrolled descent into sitting     Ambulation/Gait   Ambulation/Gait Yes   Ambulation/Gait Assistance 5: Supervision;4: Min guard   Ambulation/Gait Assistance Details Pt has several episodes of RLE freezing/R foot sticking to floor with turning to prepare to sit   Ambulation Distance (Feet) 110 Feet   Assistive device Rolling walker   Gait Pattern Step-through pattern;Decreased step length - right;Decreased step length - left;Decreased dorsiflexion - right;Shuffle;Festinating;Trunk flexed;Narrow base of support  Increased forward lean on RW; R foot slap with gait   Gait velocity 15.97 sec= 2.05 ft/sec     Standardized Balance Assessment   Standardized Balance Assessment Timed Up and Go Test;Berg Balance Test     Berg Balance  Test   Sit to Stand Able to stand  independently using hands   Standing Unsupported Able to stand 2 minutes with supervision   Sitting with Back Unsupported but Feet Supported on Floor or Stool Able to sit safely and securely 2 minutes   Stand to Sit Sits independently, has uncontrolled descent   Transfers Able to transfer with verbal cueing and /or supervision   Standing Unsupported with Eyes Closed Able to stand 10 seconds with supervision   Standing Ubsupported with Feet Together Needs help to attain position but able to stand for 30 seconds with feet together   From Standing, Reach  Forward with Outstretched Arm Reaches forward but needs supervision   From Standing Position, Pick up Object from Floor Unable to try/needs assist to keep balance   From Standing Position, Turn to Look Behind Over each Shoulder Needs supervision when turning   Turn 360 Degrees Needs assistance while turning   Standing Unsupported, Alternately Place Feet on Step/Stool Needs assistance to keep from falling or unable to try   Standing Unsupported, One Foot in [place] help to step but can hold 15 seconds   Standing on One Leg Unable to try or needs assist to prevent fall   Total Score 20   Berg comment: Scores <45/56 indicate increased fall risk.)  one episode of posterior LOB     Timed Up and Go Test   TUG Normal TUG   Normal TUG (seconds) 27.09  using RW; R foot festinating during turn to sit   TUG Comments >13.5 sec indicates increased fall risk; >30 seconds indicates increased difficulty with ADLs in the home.                             PT Short Term Goals - 12/06/16 1954      PT SHORT TERM GOAL #1   Title Pt will perform HEP for strength, flexibility, balance and gait with wife's supervision.  TARGET 01/06/17   Time 4   Period Weeks   Status Achieved     PT SHORT TERM GOAL #2   Title Pt will perform 5x sit<>stand in less than or equal to 20 seconds (with minimal UE support  and no posterior lean) for improved safety and efficiency with transfers.   Time 4   Period Weeks   Status New     PT SHORT TERM GOAL #3   Title Pt will improve TUG score to less than or equal to 23 seconds for decreased fall risk.   Time 4   Period Weeks   Status New     PT SHORT TERM GOAL #4   Title Pt/wife will report at least 25% improvement in bed mobility, for improved safety/decreased caregiver burden with bed moiblity.   Time 4   Period Weeks   Status New           PT Long Term Goals - 12/06/16 1956      PT LONG TERM GOAL #1   Title Pt/wife will verbalize understanding of fall prevention, including tips to reduce freezing.  TARGET 02/04/17   Time 8   Period Weeks   Status New     PT LONG TERM GOAL #2   Title Pt will improve Berg Balance score to at least 25/56 for decreased fall risk.   Time 8   Period Weeks   Status New     PT LONG TERM GOAL #3   Title Pt will improve gait velocity to at least 2.3 ft/sec for improved efficiency and safety with gait.   Time 8   Period Weeks   Status New               Plan - 12/06/16 1944    Clinical Impression Statement Pt is a 71 year old male who presents to OP PT as return PT eval following discharge 06/2016, due to progressive nature of Parkinsonism course.  Pt has seen movement disorders specialist at [facility], with possible diagnosis of progressive supranuclear palsy.  Pt has had 6 falls in the past 6 months.  Pt  has at least 3 co-morbidities-see EPIC PMH.  Pt presents with decreased balance, decreased transfer safety and efficiency, decreased timing and coordination with gait, festination/freezing of gait with turning, decreased safety awareness, decreased strength, decreased ROM/flexibility.  Pt is at risk for falls per Merrilee Jansky and TUG; pt is at limited community ambulator status for gait per gait velocity measure. Pt will benefit from skilled PT to address the above stated deficits for improved functional mobility and  decreased fall risk.    Rehab Potential Fair   Clinical Impairments Affecting Rehab Potential decreased safety awareness    PT Frequency 2x / week   PT Duration 8 weeks  plus eval   PT Treatment/Interventions ADLs/Self Care Home Management;Functional mobility training;Gait training;Patient/family education;Orthotic Fit/Training;Neuromuscular re-education;Balance training;Therapeutic exercise;Therapeutic activities   PT Next Visit Plan Review exercises that patient is currently doing at home; update lower extremity stretches, strenthening; work on transfer training and safety; posture and gait   Consulted and Agree with Plan of Care Patient;Family member/caregiver   Family Member [employer]      Patient will benefit from skilled therapeutic intervention in order to improve the following deficits and impairments:  Abnormal gait, Decreased balance, Decreased mobility, Decreased range of motion, Decreased safety awareness, Decreased strength, Difficulty walking, Impaired flexibility, Postural dysfunction  Visit Diagnosis: Other abnormalities of gait and mobility  Unsteadiness on feet  Other symptoms and signs involving the nervous system  Muscle weakness (generalized)  Other symptoms and signs involving the musculoskeletal system      G-Codes - January 05, 2017 1958    Functional Assessment Tool Used (Outpatient Only) TUG >27 sec, 5x sit<>stand 24.09 sec, Berg 20/56; 6 falls in 6 months   Functional Limitation Mobility: Walking and moving around   Mobility: Walking and Moving Around Current Status [phone]) At least 40 percent but less than 60 percent impaired, limited or restricted   Mobility: Walking and Moving Around Goal Status [phone]) At least 20 percent but less than 40 percent impaired, limited or restricted       Problem List Patient Active Problem List   Diagnosis Date Noted  . Peripheral neuropathy ([place]) 11/14/2016  . PSP (progressive supranuclear palsy) ([place]) 11/08/2016  .  Hx of adenomatous colonic polyps 01/23/2016  . Edema extremities 01/13/2016  . Lumbar stenosis 06/06/2015  . HOCM (hypertrophic obstructive cardiomyopathy) ([place]) 12/12/2014  . Infection of right prepatellar bursa 10/18/2014  . DOE (dyspnea on exertion) 09/06/2014  . Fatigue due to excessive exertion 09/06/2014  . Testosterone deficiency 11/12/2013  . Routine general medical examination at a health care facility 08/30/2013  . Mixed hyperlipidemia 08/30/2013  . Other abnormal glucose 08/30/2013  . 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 Parkinsonism ([place]) 01/16/2013  . INSOMNIA 06/06/2008  . GLAUCOMA 06/01/2008  . Essential hypertension 06/01/2008  . Seasonal and perennial allergic rhinitis 06/01/2008    MARRIOTT,AMY W. 01/05/2017, 7:59 PM  Frazier Butt., PT  [facility] [address] [place], [place], [address] Phone: [phone]   Fax:  [phone]  Name: [name] MRN: [id number] Date of Birth: December 01, 1945

## 2016-12-06 NOTE — Therapy (Signed)
[facility] [address] [place], [place], [address] Phone: [phone]   Fax:  [phone]  Occupational Therapy Evaluation  Patient Details  Name: [name] MRN: [id number] Date of Birth: [birth year] Referring Provider: Dr. Star Age  Encounter Date: 12/06/2016      OT End of Session - 12/06/16 1259    Visit Number 1   Number of Visits 17   Date for OT Re-Evaluation 02/04/17   Authorization Type Medicare / BCBS, G-code needed   Authorization - Visit Number 1   Authorization - Number of Visits 10   OT Start Time [id number]   OT Stop Time 1145   OT Time Calculation (min) 40 min   Activity Tolerance Patient tolerated treatment well   Behavior During Therapy Impulsive;Restless      Past Medical History:  Diagnosis Date  . Allergic rhinitis   . Allergy   . Arthritis   . At risk for falls   . Cancer ([place])    basal cell skin cancer  . Cataract   . Complication of anesthesia    Slow to awaken  . Constipation   . DDD (degenerative disc disease), cervical   . Dysphagia   . Edema extremities 01/13/2016  . GERD (gastroesophageal reflux disease)    Decdreased, has decreased coffee intack.  . Glaucoma   . HTN (hypertension)   . Hx of adenomatous colonic polyps 01/23/2016  . Hyperlipidemia   . Hypogonadism male   . Hypothyroidism   . OSA (obstructive sleep apnea)    Has had surgery x 2  . Parkinsonism ([place]) 01/16/2013   no tremors, balance issues only , speech and swallowing difficulties   . Pre-diabetes   . Sleep apnea    surgery x 2- no cpap  . Staph infection 10-2014  . Thyroid disease    HYPOTHYROID    Past Surgical History:  Procedure Laterality Date  . CATARACT EXTRACTION    . COLONOSCOPY  04-27-2004   tics, hems   . I&D EXTREMITY Right 10/18/2014   Procedure: IRRIGATION AND DEBRIDEMENT RIGHT KNEE PREPATELLA BURSA INFECTION;  Surgeon: [name], MD;  Location: [facility];  Service: Orthopedics;   Laterality: Right;  . [place] SURGERY  04-2015  . LUMBAR LAMINECTOMY WITH COFLEX 1 LEVEL N/A 06/06/2015   Procedure: Lumbar three-four Laminectomy/Foraminotomy with placement of Coflex;  Surgeon: [name], MD;  Location: [facility];  Service: Neurosurgery;  Laterality: N/A;  . NASAL SEPTUM SURGERY    . TONSILLECTOMY     and adenoids  . UVULOPALATOPHARYNGOPLASTY      There were no vitals filed for this visit.      Subjective Assessment - 12/06/16 1105    Subjective  What can I do to strengthening my shoulders   Patient is accompained by: Family member  [name]   Pertinent History Progressive Supranuclear Palsy (PSP), hx of falls, cognitive deficits, glaucoma, lumbar stenosis, peripheral neuropathy   Limitations fall risk, cognitive deficits, impulsivity   Patient Stated Goals brushing hair, putting on deodorant, pulling on shirt, writing numbers   Currently in Pain? Yes   Pain Score 3    Pain Location Shoulder   Pain Orientation Right   Pain Descriptors / Indicators Aching;Sore   Pain Type Chronic pain   Pain Onset More than a month ago   Pain Frequency Intermittent   Aggravating Factors  certain movements, ER (wife reports abnormal positioning of RUE with sleep/watching tv)   Pain Relieving Factors  rest           [facility] OT Assessment - 12/06/16 1106      Assessment   Diagnosis PSP/Parkinsonism   Referring Provider Dr. Star Age   Onset Date 11/19/16  last MD appt   Prior Therapy recommended re-eval 06/21/16 when last d/c from OT     Precautions   Precautions [place]   Has the patient fallen in the past 6 months Yes   How many times? 6  falls approx 1x/month (last-adjusting things in standing)     Home  Environment   Family/patient expects to be discharged to: Private residence   Lives With Spouse     Prior Function   Level of [place] for independence;Needs assistance with ADLs   Comments Does supine  exercises and coordination HEP (2x/week)     ADL   Eating/Feeding --  eats too fast, spills, choking   Eating/Feeding holds liquid   Grooming --  difficulty brushing hair, small movements   Grooming details difficulty putting on deodorant and opening it  electric toothbrush/razor   Upper Body Bathing --  difficulty washing hair, washing/drying hands   Lower Body Bathing Modified independent   Upper Body Dressing --  difficulty adjusting shirts, small movements, doffing jacket   Lower Body Dressing --  wife does shoes/socks   Toilet Tranfer Modified independent   Toileting - Clothing Manipulation Modified independent   Toileting -  Retail buyer   ADL comments Wife performs IADLs.  Pt needs mod cueing for use of strategies/bigger movements for ADLs     Mobility   Mobility Status History of falls;Freezing  supervision, freezing with turns   Mobility Status Comments ambulates with RW with supervision, falls backwards     Written Expression   Dominant Hand Left   Handwriting --  mild-mod micrographia with numbers/sentence   Written Experience --  wants to work on writing down phone #'s     Vision - History   Baseline Vision Wears glasses only for reading   Additional Comments decr vertical tracking     Cognition   Overall Cognitive Status Impaired/Different from baseline   Area of Impairment Attention;Memory;Safety/judgement;Awareness;Problem solving   Safety and Judgement Comments impaired    Problem Solving Slow processing;Difficulty sequencing   Memory Impaired   Memory Impairment Decreased short term memory   Awareness Impaired   Behaviors Impulsive;Perseveration;Poor frustration tolerance;Lability     Observation/Other Assessments   Physical Performance Test   Yes   Simulated Eating Time (seconds) 16.12   Simulated Eating Comments holds spoon at the end    Donning Doffing Jacket Time (seconds) able to don in sitting mod I (without cues), unable to doff without assist due to R shoulder pain/stiffness     Coordination   9 Hole Peg Test Right;Left   Right 9 Hole Peg Test 28.93   Left 9 Hole Peg Test 29.41   Box and Blocks R-36 blocks (43-7=36), L-54 blocks (57-3=54 due to bradykinesia)     Tone   Assessment Location Right Upper Extremity;Left Upper Extremity     ROM / Strength   AROM / PROM / Strength AROM     AROM   Overall AROM Comments BUEs grossly WFL (mild impairments with R ER and bilateral shoulder flex, abduction), needs cueing due to bradykinesia     RUE Tone   RUE Tone Mild  LUE Tone   LUE Tone Mild  L>R                           OT Short Term Goals - 12/06/16 1311      OT SHORT TERM GOAL #1   Title Pt will perform updated HEP with supervision/min cues.--check STGs 01/04/17   Time 4   Period Weeks   Status New     OT SHORT TERM GOAL #2   Title Pt will wash/dry hands adequately with min cueing.   Time 4   Period Weeks   Status New     OT SHORT TERM GOAL #3   Title Pt will report incr ease with washing hair.   Baseline -----   Period Weeks   Status New     OT SHORT TERM GOAL #4   Title Pt will be able to put deodorant lid on/off and apply with min v.c. for movement amplitude.   Baseline -----   Time 4   Period Weeks   Status New     OT SHORT TERM GOAL #5   Title ----------   Baseline -------   Time --   Period --   Status --           OT Long Term Goals - 12/06/16 1318      OT LONG TERM GOAL #1   Title Pt/caregiver will verbalize understanding of adaptive strategies/AE for ADLs prn.--check LTGs 02/04/17   Time 8   Period Weeks   Status New     OT LONG TERM GOAL #2   Title Pt will demonstrate improved RUE functional use as evidenced by increasing box/ blocks score to 40 blocks   Baseline 35   Period Weeks   Status New     OT LONG TERM GOAL #3   Title Pt will write  phone number once given verbally with at least 90% legibility.   Time 8   Period Weeks   Status New     OT LONG TERM GOAL #4   Title Pt will be able to brush his hair with min cues for movement amplitude.   Time 8   Period Weeks   Status New     OT LONG TERM GOAL #5   Title Pt will report improved shoulder pain as shown by ability to don/doff jacket mod I.   Baseline unable to doff due to R shoulder pain   Time 8   Period Weeks   Status New               Plan - 12/06/16 1301    Clinical Impression Statement Pt is a 71 y.o. male with diagnosis of PSP.  Pt also with PMH that includes:  glaucoma, lumbar stenosis, peripheral neuropathy, cognitive deficits, multiple falls.  Pt last seen by occupational therapy 06/2016.  Pt presents today with hypokinesia, rigidity, decr balance/functional mobility, decr coordination, cognitive deficits, freezing, timing deficits affecting ADL performance.  Pt would benefit from occupational therapy to addres these deficits for incr ease/safety with ADLs.   Rehab Potential Good   Clinical Impairments Affecting Rehab Potential cognitive deficits, impulsivity   OT Frequency 2x / week   OT Duration 8 weeks  +eval   OT Treatment/Interventions Self-care/ADL training;Cryotherapy;Energy conservation;Neuromuscular education;Functional Mobility Training;Manual Therapy;Passive range of motion;Cognitive remediation/compensation;Therapeutic activities;Visual/perceptual remediation/compensation;Therapeutic exercises;Therapeutic exercise;Patient/family education;DME and/or AE instruction;Moist Heat;Balance training   Plan PWR! up, rock, twist in supine due to shoulder pain/stiffness; address  ADLs   Recommended Other Services current with PT, ST   Consulted and Agree with Plan of Care Patient;Family member/caregiver   Family Member Consulted wife--[name]      Patient will benefit from skilled therapeutic intervention in order to improve the following deficits and  impairments:  Decreased coordination, Decreased range of motion, Decreased safety awareness, Decreased knowledge of precautions, Impaired tone, Impaired UE functional use, Pain, Impaired perceived functional ability, Decreased mobility, Decreased cognition, Decreased balance, Decreased knowledge of use of DME (Hypokinesia)  Visit Diagnosis: Other symptoms and signs involving the nervous system  Other symptoms and signs involving the musculoskeletal system  Other lack of coordination  Other abnormalities of gait and mobility  Unsteadiness on feet  Frontal lobe and executive function deficit  Right shoulder pain, unspecified chronicity  Stiffness of right shoulder, not elsewhere classified      G-Codes - Dec 24, 2016 January 24, 2040    Functional Assessment Tool Used (Outpatient only) clinical observation/needs mod cueing for safety and effeciency with ADLs   Functional Limitation Self care   Self Care Current Status [id number]) At least 60 percent but less than 80 percent impaired, limited or restricted   Self Care Goal Status [id number]) At least 20 percent but less than 40 percent impaired, limited or restricted      Problem List Patient Active Problem List   Diagnosis Date Noted  . Peripheral neuropathy ([place]) 11/14/2016  . PSP (progressive supranuclear palsy) ([place]) 11/08/2016  . Hx of adenomatous colonic polyps 01/23/2016  . Edema extremities 01/13/2016  . Lumbar stenosis 06/06/2015  . HOCM (hypertrophic obstructive cardiomyopathy) ([place]) 12/12/2014  . Infection of right prepatellar bursa 10/18/2014  . DOE (dyspnea on exertion) 09/06/2014  . Fatigue due to excessive exertion 09/06/2014  . Testosterone deficiency 11/12/2013  . Routine general medical examination at a health care facility 08/30/2013  . Mixed hyperlipidemia 08/30/2013  . Other abnormal glucose 08/30/2013  . 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 Parkinsonism ([place]) 01/16/2013  . INSOMNIA 06/06/2008  . GLAUCOMA 06/01/2008  .  Essential hypertension 06/01/2008  . Seasonal and perennial allergic rhinitis 06/01/2008    [facility] Dec 24, 2016, 8:44 PM  [facility] [address] [place], [place], [address] Phone: [phone]   Fax:  [phone]  Name: [name] MRN: [id number] Date of Birth: 11-29-1945   Vianne Bulls, OTR/L [facility] [address] [place], [place]  [address] [phone] phone [phone] December 24, 2016 8:44 PM

## 2016-12-07 NOTE — Therapy (Signed)
[facility] [address] [place], [place], [address] Phone: [phone]   Fax:  [phone]  Speech Language Pathology Evaluation  Patient Details  Name: [name] MRN: [id number] Date of Birth: 30-Jan-1946 Referring Provider: Alonza Bogus, DO  Encounter Date: 12/06/2016      End of Session - 12/06/16 1728    Visit Number 1   Number of Visits 9   Date for SLP Re-Evaluation 02/01/17   SLP Start Time 0934   SLP Stop Time  1016   SLP Time Calculation (min) 42 min   Activity Tolerance Patient tolerated treatment well      Past Medical History:  Diagnosis Date  . Allergic rhinitis   . Allergy   . Arthritis   . At risk for falls   . Cancer ([place])    basal cell skin cancer  . Cataract   . Complication of anesthesia    Slow to awaken  . Constipation   . DDD (degenerative disc disease), cervical   . Dysphagia   . Edema extremities 01/13/2016  . GERD (gastroesophageal reflux disease)    Decdreased, has decreased coffee intack.  . Glaucoma   . HTN (hypertension)   . Hx of adenomatous colonic polyps 01/23/2016  . Hyperlipidemia   . Hypogonadism male   . Hypothyroidism   . OSA (obstructive sleep apnea)    Has had surgery x 2  . Parkinsonism ([place]) 01/16/2013   no tremors, balance issues only , speech and swallowing difficulties   . Pre-diabetes   . Sleep apnea    surgery x 2- no cpap  . Staph infection 10-2014  . Thyroid disease    HYPOTHYROID    Past Surgical History:  Procedure Laterality Date  . CATARACT EXTRACTION    . COLONOSCOPY  04-27-2004   tics, hems   . I&D EXTREMITY Right 10/18/2014   Procedure: IRRIGATION AND DEBRIDEMENT RIGHT KNEE PREPATELLA BURSA INFECTION;  Surgeon: [name], MD;  Location: [facility];  Service: Orthopedics;  Laterality: Right;  . [place] SURGERY  04-2015  . LUMBAR LAMINECTOMY WITH COFLEX 1 LEVEL N/A 06/06/2015   Procedure: Lumbar three-four Laminectomy/Foraminotomy with  placement of Coflex;  Surgeon: [name], MD;  Location: [facility];  Service: Neurosurgery;  Laterality: N/A;  . NASAL SEPTUM SURGERY    . TONSILLECTOMY     and adenoids  . UVULOPALATOPHARYNGOPLASTY      There were no vitals filed for this visit.      Subjective Assessment - 12/06/16 1710    Subjective "Was that your mom?" (pt, re: seeing SLP with another patient from waiting room prior to his session)   Patient is accompained by: --  wife, [name]   Currently in Pain? Yes   Pain Score 3    Pain Location Shoulder   Pain Orientation Right   Pain Descriptors / Indicators Sore   Pain Type Chronic pain   Pain Onset More than a month ago   Pain Frequency Intermittent   Aggravating Factors  "moving it"   Pain Relieving Factors "not moving it, Megen Madewell"            SLP Evaluation [facility] - 12/07/16 0001      SLP Visit Information   SLP Received On 12/06/16     Pain Assessment   Currently in Pain? Yes   Pain Score 3    Pain Location Shoulder   Pain Orientation Right   Pain Type Chronic pain   Pain  Onset More than a month ago   Pain Frequency Intermittent   Pain Relieving Factors "not moving it, Mattia Osterman"     General Information   HPI Pt well known to this SLP from previous courses of ST. Initial ST course in 2017 focused primarily on dysphagia, and pt had great difficulty in performing the HEP accurately, and was resistant to assistance from wife or other family members. Last treatment course in 2017 focused primarily on speech intelligibliity, without notable functional success due to limited/no carryover to home. Pt returns today for re-eval. "He wants instant change" wife shared with SLP today.     Prior Functional Status   Cognitive/Linguistic Baseline Baseline deficits   Baseline deficit details awareness, memory, attention, organization/sequencing, executive function   Type of Home House    Lives With Spouse   Available Support Family     Cognition   Overall Cognitive  Status History of cognitive impairments - at baseline   Area of Impairment Attention;Memory;Safety/judgement;Awareness;Problem solving   Current Attention Level Focused;Sustained   Attention Comments pt frequently looking out window at passersby   Safety/Judgement Decreased awareness of safety;Decreased awareness of deficits   Safety and Judgement Comments impaired    Awareness Intellectual;Emergent;Anticipatory   Awareness Comments safety and judgement both negatively affected; pt unaware of wet voice, pt unwilling to have help at home with HEP despite significant swallowing deficit   Problem Solving Slow processing;Difficulty sequencing   Behaviors Impulsive;Poor frustration tolerance     Auditory Comprehension   Overall Auditory Comprehension Appears within functional limits for tasks assessed     Verbal Expression   Overall Verbal Expression Appears within functional limits for tasks assessed   Other Verbal Expression Comments difficult to assess given severity of dysarthria. Suspect pt has some degree of expressive aphasia given telegraphic speech.     Motor Speech   Overall Motor Speech Impaired   Respiration Impaired   Level of Impairment Word   Phonation Wet  intermittent likely due to decr'd control of secretions   Articulation Impaired   Level of Impairment Word   Intelligibility Intelligibility reduced   Word 75-100% accurate   Sentence 50-74% accurate   Conversation 50-74% accurate   Interfering Components Premorbid status   Effective Techniques Slow rate  minimally successful today in diagnostic therapy   Phonation Impaired   Volume Soft;Decibel Level  <70dB     Dysphagia BSE: Wife reports pt has significant oral holding with liquids, frequently over 15 seconds in length, prior to triggering a swallow. "He won't swallow," wife stated. SLP explained this may not be due to pt's fault but may be a symptom of the disease, with the swallow musculature "freezing". Wife  reports this occurs more with water than with other types of liquids. No overt s/s aspiration PNA observed today, and no overt s/s were reported by pt/wife.  SLP provided pt with 4 different types of liquids with ice (ice tea, lemonade, water, and ginger ale) for purpose of seeing if there was any difference between sensory input from each, to facilitate more consistent oral trigger times and minimize freezing of the swallowing musculature. Pt took WNL size sips for pt (smaller than WNL for non-involved individuals) and never exhibited oral holding. With all four liquids, pt experienced significant coughing at least once with each liquid on 3 sips of each. After pt's first swallow of the first liquid, SLP cued pt to take small sips and pt did so, however still coughed with all liquids. A modified baruim  swallow exam is recommended at this time to assess what pt may do to decr aspiration risk. The lack of overt s/s aspiration PNA now does not ensure pt will remain PNA-free in the future given his degenerative disease and inevitable debilitation due to this. Pt's wife also needs education with an objective study to allow for accurate and efficacious cues.  SLP will focus pt's therapy on pt and caregiver education for dysphagia, x1/week for 8 visits. Pt/wife agree.                  ADULT SLP TREATMENT - 12/06/16 1712      General Information   Behavior/Cognition Confused;Distractible;Impulsive   HPI Pt well-known to this SLP from previous therapy courses. Last ST course, pt worked on reduction of speech rate with little success, discharged with max potential reached at that time. In the therapy course prior in Januray 2017 the focus was dysphagia. Pt made little progress due to reduced memory for procedure of HEP. SLP suggested pt get help at home when doing HEP but pt was resistant to having someone at home assist him with dysphagia exercises. He was discharged with needing assistance in  following swallowing precautions.           SLP Education - 12/06/16 1726    Education provided Yes   Education Details suggest modified barium swallow, "symptoms" vs. "stubborn" (pt may not be being stubborn but some lack of participation may be explained by the disease process)   Person(s) Educated Patient;Spouse   Methods Explanation   Comprehension Verbalized understanding;Need further instruction          SLP Short Term Goals - 12/06/16 1733      SLP SHORT TERM GOAL #2   Title --          SLP Long Term Goals - 12/06/16 1733      SLP LONG TERM GOAL #1   Title pt will perform HEP with usual mod A   Time 4   Period Weeks  or visits   Status New     SLP LONG TERM GOAL #2   Title pt or wife will demo understanding of 4 s/s aspiration PNA   Time 4   Period Weeks  or visits   Status New     SLP LONG TERM GOAL #3   Title to benefit pt's pulmonary health, wife will demo appropriate cueing for pt during POs over two sessions   Time 4   Period Weeks  or visits   Status New     SLP LONG TERM GOAL #4   Title pt will produce a set of 10 phrases/sentences with 80% intelligibility over two sessions   Time 4   Period Weeks  or visits   Status New          Plan - 12/06/16 1728    Clinical Impression Statement Pt presents with dysphagia c/b coughing with all thin liquids provided to pt. Looked as if pt was keeping sip sizes appropriate today. Recommend modified barium swallow eval to r/o aspiration and to assess what pt's safest diet might be. After this test, SLP will focus on pt's following of swallow/aspiration precautions to preserve pulmonary function and limit medical complications caused by aspiration /aspiration PNA. Pt does not appear interested in having family members (wife, etc) assist him with HEP for dysphagia, but in case pt changes his mind a goal will be written for HEP.    Speech Therapy Frequency 1x /week  Duration 4 weeks    Treatment/Interventions Aspiration precaution training;Diet toleration management by SLP;Cueing hierarchy;Patient/family education;SLP instruction and feedback;Internal/external aids;Compensatory techniques;Environmental controls;Pharyngeal strengthening exercises  any or all may be used; pharyngeal strengthening only if pt decides to accept help with HEP    Potential to Achieve Goals Fair   Potential Considerations Previous level of function;Ability to learn/carryover information;Severity of impairments;Cooperation/participation level   Consulted and Agree with Plan of Care Patient;Family member/caregiver   Family Member Consulted wife      Patient will benefit from skilled therapeutic intervention in order to improve the following deficits and impairments:   Dysarthria and anarthria  Oropharyngeal dysphagia  Cognitive communication deficit  Aphasia      G-Codes - 12-19-16 1638    Functional Assessment Tool Used noms   Functional Limitations Motor speech   Motor Speech Current Status [phone]) At least 80 percent but less than 100 percent impaired, limited or restricted   Motor Speech Goal Status [id number]) At least 80 percent but less than 100 percent impaired, limited or restricted      Problem List Patient Active Problem List   Diagnosis Date Noted  . Peripheral neuropathy ([place]) 11/14/2016  . PSP (progressive supranuclear palsy) ([place]) 11/08/2016  . Hx of adenomatous colonic polyps 01/23/2016  . Edema extremities 01/13/2016  . Lumbar stenosis 06/06/2015  . HOCM (hypertrophic obstructive cardiomyopathy) ([place]) 12/12/2014  . Infection of right prepatellar bursa 10/18/2014  . DOE (dyspnea on exertion) 09/06/2014  . Fatigue due to excessive exertion 09/06/2014  . Testosterone deficiency 11/12/2013  . Routine general medical examination at a health care facility 08/30/2013  . Mixed hyperlipidemia 08/30/2013  . Other abnormal glucose 08/30/2013  . Impotence of organic origin  08/30/2013  . Parkinsonism ([place]) 01/16/2013  . INSOMNIA 06/06/2008  . GLAUCOMA 06/01/2008  . Essential hypertension 06/01/2008  . Seasonal and perennial allergic rhinitis 06/01/2008    Lonie [email or web address]  12/07/2016, 2:39 PM  [facility] [address] [place], [place], [address] Phone: [phone]   Fax:  [phone]  Name: Samel Butkovich MRN: [id number] Date of Birth: Aug 26, 1946

## 2016-12-11 DIAGNOSIS — L603 Nail dystrophy: Secondary | ICD-10-CM | POA: Diagnosis not present

## 2016-12-11 DIAGNOSIS — D225 Melanocytic nevi of trunk: Secondary | ICD-10-CM | POA: Diagnosis not present

## 2016-12-11 DIAGNOSIS — Z85828 Personal history of other malignant neoplasm of skin: Secondary | ICD-10-CM | POA: Diagnosis not present

## 2016-12-11 DIAGNOSIS — L570 Actinic keratosis: Secondary | ICD-10-CM | POA: Diagnosis not present

## 2016-12-14 ENCOUNTER — Telehealth: Payer: Self-pay | Admitting: Neurology

## 2016-12-14 DIAGNOSIS — G20 Parkinson's disease: Secondary | ICD-10-CM

## 2016-12-14 DIAGNOSIS — R1310 Dysphagia, unspecified: Secondary | ICD-10-CM

## 2016-12-14 NOTE — Telephone Encounter (Signed)
Pt's wife called says [name]/PT said the pt needs swallowing test, the last one done was 2016. She said he sent the msg to Dr Rexene Alberts while they were in his office.

## 2016-12-14 NOTE — Telephone Encounter (Signed)
[name]: Patient would benefit from swallow eval. Hope I entered the right test, can you check with neuro rehab [facility]? He has seen Garald Balding.

## 2016-12-17 NOTE — Telephone Encounter (Signed)
Dr. Rexene Alberts I will get Beverlee Nims to Reorder for you order number [id number]

## 2016-12-17 NOTE — Addendum Note (Signed)
Addended by: Star Age on: 12/17/2016 11:02 AM   Modules accepted: Orders

## 2016-12-17 NOTE — Telephone Encounter (Signed)
Order is in, thank you Hinton Dyer

## 2016-12-18 NOTE — Telephone Encounter (Signed)
Patient is scheduled for Swallow Study 12/26/2016 arrive at [facility]  At 11:15 am  1st floor radiology . No restrictions. Called Patient and Left him a detailed message.

## 2016-12-19 ENCOUNTER — Other Ambulatory Visit (HOSPITAL_COMMUNITY): Payer: Self-pay | Admitting: Neurology

## 2016-12-19 DIAGNOSIS — R1319 Other dysphagia: Secondary | ICD-10-CM

## 2016-12-25 ENCOUNTER — Other Ambulatory Visit: Payer: Self-pay | Admitting: Internal Medicine

## 2016-12-25 ENCOUNTER — Ambulatory Visit: Payer: Medicare Other | Admitting: Physical Therapy

## 2016-12-25 ENCOUNTER — Ambulatory Visit: Payer: Medicare Other | Admitting: Occupational Therapy

## 2016-12-25 VITALS — BP 114/73 | HR 54

## 2016-12-25 DIAGNOSIS — R29898 Other symptoms and signs involving the musculoskeletal system: Secondary | ICD-10-CM

## 2016-12-25 DIAGNOSIS — M6281 Muscle weakness (generalized): Secondary | ICD-10-CM

## 2016-12-25 DIAGNOSIS — M25611 Stiffness of right shoulder, not elsewhere classified: Secondary | ICD-10-CM

## 2016-12-25 DIAGNOSIS — M25511 Pain in right shoulder: Secondary | ICD-10-CM

## 2016-12-25 DIAGNOSIS — R1312 Dysphagia, oropharyngeal phase: Secondary | ICD-10-CM | POA: Diagnosis not present

## 2016-12-25 DIAGNOSIS — R41844 Frontal lobe and executive function deficit: Secondary | ICD-10-CM

## 2016-12-25 DIAGNOSIS — R278 Other lack of coordination: Secondary | ICD-10-CM

## 2016-12-25 DIAGNOSIS — R29818 Other symptoms and signs involving the nervous system: Secondary | ICD-10-CM

## 2016-12-25 DIAGNOSIS — R2689 Other abnormalities of gait and mobility: Secondary | ICD-10-CM | POA: Diagnosis not present

## 2016-12-25 DIAGNOSIS — R41840 Attention and concentration deficit: Secondary | ICD-10-CM

## 2016-12-25 DIAGNOSIS — R2681 Unsteadiness on feet: Secondary | ICD-10-CM | POA: Diagnosis not present

## 2016-12-25 NOTE — Patient Instructions (Signed)
1.  Lay on your back on bed (1 pillow) and bring your arms by your side, push elbows down into bed, squeeze shoulder blades together and lift chest.  Repeat 15 times.  2. Lay on your back, hold ball in both hands (hands open, thumbs up).  Raise arms up and behind head as far as you can with elbows straight.  Slowly perform 15 times.  Look straight ahead, do not look at ball.

## 2016-12-25 NOTE — Therapy (Signed)
[name] [address] [name], [place], [address] Phone: [phone]   Fax:  [phone]  Occupational Therapy Treatment  Patient Details  Name: [name] MRN: [id number] Date of Birth: May 16, 1946 Referring Provider: Dr. Star Age  Encounter Date: 12/25/2016      OT End of Session - 12/25/16 1508    Visit Number 2   Number of Visits 17   Date for OT Re-Evaluation 02/04/17   Authorization Type Medicare / BCBS, G-code needed   Authorization Time Period pt currently scheduled through 01/24/17   Authorization - Visit Number 2   Authorization - Number of Visits 10   OT Start Time 1105   OT Stop Time 1145   OT Time Calculation (min) 40 min   Activity Tolerance Patient tolerated treatment well   Behavior During Therapy Impulsive;Restless      Past Medical History:  Diagnosis Date  . Allergic rhinitis   . Allergy   . Arthritis   . At risk for falls   . Cancer ([place])    basal cell skin cancer  . Cataract   . Complication of anesthesia    Slow to awaken  . Constipation   . DDD (degenerative disc disease), cervical   . Dysphagia   . Edema extremities 01/13/2016  . GERD (gastroesophageal reflux disease)    Decdreased, has decreased coffee intack.  . Glaucoma   . HTN (hypertension)   . Hx of adenomatous colonic polyps 01/23/2016  . Hyperlipidemia   . Hypogonadism male   . Hypothyroidism   . OSA (obstructive sleep apnea)    Has had surgery x 2  . Parkinsonism ([place]) 01/16/2013   no tremors, balance issues only , speech and swallowing difficulties   . Pre-diabetes   . Sleep apnea    surgery x 2- no cpap  . Staph infection 10-2014  . Thyroid disease    HYPOTHYROID    Past Surgical History:  Procedure Laterality Date  . CATARACT EXTRACTION    . COLONOSCOPY  04-27-2004   tics, hems   . I&D EXTREMITY Right 10/18/2014   Procedure: IRRIGATION AND DEBRIDEMENT RIGHT KNEE PREPATELLA BURSA INFECTION;  Surgeon:  [name], MD;  Location: [facility];  Service: Orthopedics;  Laterality: Right;  . [place] SURGERY  04-2015  . LUMBAR LAMINECTOMY WITH COFLEX 1 LEVEL N/A 06/06/2015   Procedure: Lumbar three-four Laminectomy/Foraminotomy with placement of Coflex;  Surgeon: [name], MD;  Location: [facility];  Service: Neurosurgery;  Laterality: N/A;  . NASAL SEPTUM SURGERY    . TONSILLECTOMY     and adenoids  . UVULOPALATOPHARYNGOPLASTY      Vitals:   12/25/16 1124  BP: 114/73  Pulse: (!) 54        Subjective Assessment - 12/25/16 1106    Subjective  Pt denies dizziness at home   Patient is accompained by: Family member  wife [name]   Pertinent History Progressive Supranuclear Palsy (PSP), hx of falls, cognitive deficits, glaucoma, lumbar stenosis, peripheral neuropathy   Limitations fall risk, cognitive deficits, impulsivity   Patient Stated Goals brushing hair, putting on deodorant, pulling on shirt, writing numbers   Currently in Pain? No/denies  pt instructed to move within pain free range only today   Pain Onset More than a month ago         PWR! Rock in supine with reports of 5/10 dizziness.  Discussed that due to PSP, pt may have dizziness due to decr eye movement  and difficulty looking up at hand.  Attempted rock with stabilizing vision, but pt continued to report dizziness (therefore discontinued).  Pt instructed in PWR! Up in supine and shoulder flex BUEs with ball for AAROM/stretch (pt instructed to move within pain free range and not to watch ball but to stabilize vision on one point to decr dizziness.  Pt given cueing for impulsivity with sit>stand and trying to move prior to dizziness resolving (as instructed).  Donning jacket in sitting with no reports of pain and min difficulty/A (cueing for incr movement amplitude with pushing arms in sleeve).  Washing/drying hands x3 with min-mod cueing for incr movement amplitude.  Min A/cueing given for RW placement when washing  hands.  Arm bike x56min level 1 for reciprocal movement with cues/target of at least 40rpms for intensity while maintaining movement amplitude/reciprocal movement.   Pt maintained 40-44rpms.  Pt reported dizziness at end of activity (likely due to looking up at speed).  Waited until dizziness resolved before standing up and leaving.  Recommended tabletop UBE that pt can use on floor on table for exercise at home.  Pt/wife verbalized understanding.                         OT Education - 12/25/16 1514    Education provided Yes   Education Details PWR! up in supine; shoulder flex BUEs with ball in supine--see pt instructions   Person(s) Educated Patient   Methods Explanation;Demonstration;Verbal cues;Handout   Comprehension Verbalized understanding;Returned demonstration;Verbal cues required;Need further instruction          OT Short Term Goals - 12/06/16 1311      OT SHORT TERM GOAL #1   Title Pt will perform updated HEP with supervision/min cues.--check STGs 01/04/17   Time 4   Period Weeks   Status New     OT SHORT TERM GOAL #2   Title Pt will wash/dry hands adequately with min cueing.   Time 4   Period Weeks   Status New     OT SHORT TERM GOAL #3   Title Pt will report incr ease with washing hair.   Baseline -----   Period Weeks   Status New     OT SHORT TERM GOAL #4   Title Pt will be able to put deodorant lid on/off and apply with min v.c. for movement amplitude.   Baseline -----   Time 4   Period Weeks   Status New     OT SHORT TERM GOAL #5   Title ----------   Baseline -------   Time --   Period --   Status --           OT Long Term Goals - 12/06/16 1318      OT LONG TERM GOAL #1   Title Pt/caregiver will verbalize understanding of adaptive strategies/AE for ADLs prn.--check LTGs 02/04/17   Time 8   Period Weeks   Status New     OT LONG TERM GOAL #2   Title Pt will demonstrate improved RUE functional use as evidenced by increasing  box/ blocks score to 40 blocks   Baseline 35   Period Weeks   Status New     OT LONG TERM GOAL #3   Title Pt will write phone number once given verbally with at least 90% legibility.   Time 8   Period Weeks   Status New     OT LONG TERM GOAL #4   Title  Pt will be able to brush his hair with min cues for movement amplitude.   Time 8   Period Weeks   Status New     OT LONG TERM GOAL #5   Title Pt will report improved shoulder pain as shown by ability to don/doff jacket mod I.   Baseline unable to doff due to R shoulder pain   Time 8   Period Weeks   Status New               Plan - 12/25/16 1511    Clinical Impression Statement Pt reports dizziness with head turns and attempting to look up; therefore unable to do PWR! rock and twist.  Pt demo improving performance with less cueing for washing/drying hands.   Rehab Potential Good   Clinical Impairments Affecting Rehab Potential cognitive deficits, impulsivity   OT Frequency 2x / week   OT Duration 8 weeks  +eval   OT Treatment/Interventions Self-care/ADL training;Cryotherapy;Energy conservation;Neuromuscular education;Functional Mobility Training;Manual Therapy;Passive range of motion;Cognitive remediation/compensation;Therapeutic activities;Visual/perceptual remediation/compensation;Therapeutic exercises;Therapeutic exercise;Patient/family education;DME and/or AE instruction;Moist Heat;Balance training   Plan review PWR! up and shoulder flex with ball; opening containers; writing phone numbers   OT Home Exercise Plan Education provided 12/25/16 shoulder flex with ball in supine, PWR! up in supine   Consulted and Agree with Plan of Care Patient;Family member/caregiver   Family Member Consulted wife--[name]      Patient will benefit from skilled therapeutic intervention in order to improve the following deficits and impairments:  Decreased coordination, Decreased range of motion, Decreased safety awareness, Decreased  knowledge of precautions, Impaired tone, Impaired UE functional use, Pain, Impaired perceived functional ability, Decreased mobility, Decreased cognition, Decreased balance, Decreased knowledge of use of DME (Hypokinesia)  Visit Diagnosis: Other symptoms and signs involving the nervous system  Other symptoms and signs involving the musculoskeletal system  Other lack of coordination  Other abnormalities of gait and mobility  Unsteadiness on feet  Frontal lobe and executive function deficit  Right shoulder pain, unspecified chronicity  Stiffness of right shoulder, not elsewhere classified  Attention and concentration deficit    Problem List Patient Active Problem List   Diagnosis Date Noted  . Peripheral neuropathy ([place]) 11/14/2016  . PSP (progressive supranuclear palsy) ([place]) 11/08/2016  . Hx of adenomatous colonic polyps 01/23/2016  . Edema extremities 01/13/2016  . Lumbar stenosis 06/06/2015  . HOCM (hypertrophic obstructive cardiomyopathy) ([place]) 12/12/2014  . Infection of right prepatellar bursa 10/18/2014  . DOE (dyspnea on exertion) 09/06/2014  . Fatigue due to excessive exertion 09/06/2014  . Testosterone deficiency 11/12/2013  . Routine general medical examination at a health care facility 08/30/2013  . Mixed hyperlipidemia 08/30/2013  . Other abnormal glucose 08/30/2013  . 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 Parkinsonism ([place]) 01/16/2013  . INSOMNIA 06/06/2008  . GLAUCOMA 06/01/2008  . Essential hypertension 06/01/2008  . Seasonal and perennial allergic rhinitis 06/01/2008    [facility] 12/25/2016, 3:25 PM  [facility] [address] [place], [place], [address] Phone: [phone]   Fax:  [phone]  Name: Gardner Servantes MRN: [id number] Date of Birth: 05/25/1946   Vianne Bulls, OTR/L [facility] [address] [place], [place]  [address] [phone]  phone [phone] 12/25/16 3:25 PM

## 2016-12-25 NOTE — Patient Instructions (Addendum)
Sit to Stand Transfers:  1. Scoot out to the edge of the chair 2. Place your feet flat on the floor, shoulder width apart.  Make sure your feet are tucked just under your knees. 3. Lean forward (nose over toes) with momentum, and stand up tall with your best posture.  If you need to use your arms, use them as a quick boost up to stand. 4. If you are in a low or soft chair, you can lean back and then forward up to stand, in order to get more momentum. 5. Once you are standing, make sure you are looking ahead and standing tall.  (Tuck in at your bottom to be tall)  To sit down:  1. Back up until you feel the chair behind your legs. 2. Bend at your hips, reaching  Back up for you chair, if needed, then slowly squat to sit down on your chair.     For STRETCHES:  Make sure to stop once you feel a gentle, comfortable stretch and hold that position for 30 seconds, 3 reps each time  -Single knee to chest  -Supine hip external rotation  -Supine hamstring stretch, using gait belt  For STRENGTHENING:  Perform 10 reps, with a 3-second hold each time  -Bridging  -SLR  -Supine hooklying resisted hip abduction with green band  -Sidelying Clamshell no resistance

## 2016-12-26 ENCOUNTER — Ambulatory Visit (HOSPITAL_COMMUNITY): Payer: Medicare Other

## 2016-12-26 DIAGNOSIS — H01001 Unspecified blepharitis right upper eyelid: Secondary | ICD-10-CM | POA: Diagnosis not present

## 2016-12-26 DIAGNOSIS — H01004 Unspecified blepharitis left upper eyelid: Secondary | ICD-10-CM | POA: Diagnosis not present

## 2016-12-26 DIAGNOSIS — H401132 Primary open-angle glaucoma, bilateral, moderate stage: Secondary | ICD-10-CM | POA: Diagnosis not present

## 2016-12-26 NOTE — Therapy (Signed)
[facility] [address] [place], [place], [address] Phone: [phone]   Fax:  [phone]  Physical Therapy Treatment  Patient Details  Name: [name] MRN: [id number] Date of Birth: [birth year] Referring Provider: Rexene Alberts  Encounter Date: 12/25/2016      PT End of Session - 12/26/16 0747    Visit Number 2   Number of Visits 17   Date for PT Re-Evaluation 02/04/17   Authorization Type Medicare Primary, BCBS second-GCODE every 10th visit   PT Start Time 1016   PT Stop Time 1100   PT Time Calculation (min) 44 min   Equipment Utilized During Treatment Gait belt   Activity Tolerance Patient tolerated treatment well   Behavior During Therapy Impulsive;WFL for tasks assessed/performed      Past Medical History:  Diagnosis Date  . Allergic rhinitis   . Allergy   . Arthritis   . At risk for falls   . Cancer ([place])    basal cell skin cancer  . Cataract   . Complication of anesthesia    Slow to awaken  . Constipation   . DDD (degenerative disc disease), cervical   . Dysphagia   . Edema extremities 01/13/2016  . GERD (gastroesophageal reflux disease)    Decdreased, has decreased coffee intack.  . Glaucoma   . HTN (hypertension)   . Hx of adenomatous colonic polyps 01/23/2016  . Hyperlipidemia   . Hypogonadism male   . Hypothyroidism   . OSA (obstructive sleep apnea)    Has had surgery x 2  . Parkinsonism ([place]) 01/16/2013   no tremors, balance issues only , speech and swallowing difficulties   . Pre-diabetes   . Sleep apnea    surgery x 2- no cpap  . Staph infection 10-2014  . Thyroid disease    HYPOTHYROID    Past Surgical History:  Procedure Laterality Date  . CATARACT EXTRACTION    . COLONOSCOPY  04-27-2004   tics, hems   . I&D EXTREMITY Right 10/18/2014   Procedure: IRRIGATION AND DEBRIDEMENT RIGHT KNEE PREPATELLA BURSA INFECTION;  Surgeon: [name], MD;  Location: [facility];  Service: Orthopedics;   Laterality: Right;  . [place] SURGERY  04-2015  . LUMBAR LAMINECTOMY WITH COFLEX 1 LEVEL N/A 06/06/2015   Procedure: Lumbar three-four Laminectomy/Foraminotomy with placement of Coflex;  Surgeon: [name], MD;  Location: [facility];  Service: Neurosurgery;  Laterality: N/A;  . NASAL SEPTUM SURGERY    . TONSILLECTOMY     and adenoids  . UVULOPALATOPHARYNGOPLASTY      There were no vitals filed for this visit.      Subjective Assessment - 12/25/16 1021    Subjective Pt reports fall on Saturday night coming out of the bathroom, scraped knee, but no pain today.   Patient is accompained by: Family member  wife, [name]   Pertinent History Neuropathy, Parkinson's/PSP   Patient Stated Goals Pt's goal for therapy is to help with walking, getting up and down out of chair and bed   Currently in Pain? No/denies                         [facility] Adult PT Treatment/Exercise - 12/25/16 1316      Bed Mobility   Bed Mobility Rolling Left;Left Sidelying to Sit;Sit to Supine   Rolling Left 5: Supervision  Slow, extra time, fearful of falling EOB with roll   Rolling Left Details (indicate cue type  and reason) Slowed-see above   Left Sidelying to Sit 5: Supervision;4: Min guard   Left Sidelying to Sit Details (indicate cue type and reason) Simulated bed rail, with slowed sidelying to sit, gets stuck midway.  Repeated x 4 reps with cues for technique to increase momentum and improve ease of sidelying to sit.   Sit to Supine 5: Supervision   Sit to Supine - Details (indicate cue type and reason) Slowed bringing up feet onto bed     Transfers   Transfers Sit to Stand;Stand to Sit   Sit to Stand 4: Min guard;5: Supervision;From bed;With upper extremity assist   Sit to Stand Details (indicate cue type and reason) Pt demo decreased forward lean, pushes bed with backs of legs.   Stand to Sit 4: Min guard;With upper extremity assist;To bed   Number of Reps 1 set;10 reps   Comments  Cues provided for improved forward lean, foot placement, and activation of gluts, quads, upright posture upon standing.     Exercises   Exercises Knee/Hip;Other Exercises   Other Exercises  Exercises described below are parts of HEP from previous therapies that pt demonstrates performing regularly at home.  Pt requires verbal cues for proper technique, pacing of stretngthening exercises and stretches.     Knee/Hip Exercises: Stretches   Active Hamstring Stretch Right;Left;3 reps;30 seconds   Active Hamstring Stretch Limitations Supine, using gait belt, with cues for 30 second hold.  LLE appears to have increased tightness.   Other Knee/Hip Stretches Supine hip external rotation stretch, 2 x 30 seconds each side; LLE more limited than RLE.   Other Knee/Hip Stretches Single knee to chest stretch, 2 reps x 30 seconds     Knee/Hip Exercises: Supine   Bridges Strengthening;Both;1 set;10 reps  3 second hold   Straight Leg Raises Strengthening;Right;Left;1 set;10 reps  3 second hold   Other Supine Knee/Hip Exercises Supine hooklying hip abduction with resisted green band x 10 reps each leg (provided green band for use at home, as pt uses red)     Knee/Hip Exercises: Sidelying   Clams Sidelying clamshell hip abduction (trialed with green band) x 10 reps each side; instructed to not use green band at home, but perform as active ROM                PT Education - 12/26/16 0747    Education provided Yes   Education Details Proper sit<>stand technqiue, Review of pt's current exercise routine with instruction on proper technique   Person(s) Educated Patient;Spouse   Methods Explanation;Demonstration;Verbal cues;Handout   Comprehension Verbalized understanding;Returned demonstration;Verbal cues required;Need further instruction          PT Short Term Goals - 12/06/16 1954      PT SHORT TERM GOAL #1   Title Pt will perform HEP for strength, flexibility, balance and gait with wife's  supervision.  TARGET 01/06/17   Time 4   Period Weeks   Status Achieved     PT SHORT TERM GOAL #2   Title Pt will perform 5x sit<>stand in less than or equal to 20 seconds (with minimal UE support and no posterior lean) for improved safety and efficiency with transfers.   Time 4   Period Weeks   Status New     PT SHORT TERM GOAL #3   Title Pt will improve TUG score to less than or equal to 23 seconds for decreased fall risk.   Time 4   Period Weeks   Status New  PT SHORT TERM GOAL #4   Title Pt/wife will report at least 25% improvement in bed mobility, for improved safety/decreased caregiver burden with bed moiblity.   Time 4   Period Weeks   Status New           PT Long Term Goals - 12/06/16 1956      PT LONG TERM GOAL #1   Title Pt/wife will verbalize understanding of fall prevention, including tips to reduce freezing.  TARGET 02/04/17   Time 8   Period Weeks   Status New     PT LONG TERM GOAL #2   Title Pt will improve Berg Balance score to at least 25/56 for decreased fall risk.   Time 8   Period Weeks   Status New     PT LONG TERM GOAL #3   Title Pt will improve gait velocity to at least 2.3 ft/sec for improved efficiency and safety with gait.   Time 8   Period Weeks   Status New               Plan - 12/26/16 0748    Clinical Impression Statement Skilled therapy session focused today on review of pt's current exercises (from previous therapies) that he performs regularly at home.  Needs cues for proper technique and pacing of exercises.  Also address safety/techique of transfers and bed mobility, but pt will need further instruction for reinforcement.   Rehab Potential Fair   Clinical Impairments Affecting Rehab Potential decreased safety awareness    PT Frequency 2x / week   PT Duration 8 weeks  plus eval   PT Treatment/Interventions ADLs/Self Care Home Management;Functional mobility training;Gait training;Patient/family education;Orthotic  Fit/Training;Neuromuscular re-education;Balance training;Therapeutic exercise;Therapeutic activities   PT Next Visit Plan Work on transfer training, bed mobility; review of HEP if needed, continue to add strengthening and stretches as needed   Consulted and Agree with Plan of Care Patient;Family member/caregiver   Family Member [employer]      Patient will benefit from skilled therapeutic intervention in order to improve the following deficits and impairments:  Abnormal gait, Decreased balance, Decreased mobility, Decreased range of motion, Decreased safety awareness, Decreased strength, Difficulty walking, Impaired flexibility, Postural dysfunction  Visit Diagnosis: Muscle weakness (generalized)  Other symptoms and signs involving the nervous system     Problem List Patient Active Problem List   Diagnosis Date Noted  . Peripheral neuropathy ([place]) 11/14/2016  . PSP (progressive supranuclear palsy) ([place]) 11/08/2016  . Hx of adenomatous colonic polyps 01/23/2016  . Edema extremities 01/13/2016  . Lumbar stenosis 06/06/2015  . HOCM (hypertrophic obstructive cardiomyopathy) ([place]) 12/12/2014  . Infection of right prepatellar bursa 10/18/2014  . DOE (dyspnea on exertion) 09/06/2014  . Fatigue due to excessive exertion 09/06/2014  . Testosterone deficiency 11/12/2013  . Routine general medical examination at a health care facility 08/30/2013  . Mixed hyperlipidemia 08/30/2013  . Other abnormal glucose 08/30/2013  . 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 Parkinsonism ([place]) 01/16/2013  . INSOMNIA 06/06/2008  . GLAUCOMA 06/01/2008  . Essential hypertension 06/01/2008  . Seasonal and perennial allergic rhinitis 06/01/2008    [place] Luther W. 12/26/2016, 7:52 AM  Frazier Butt., PT  [facility] [address] [place], [place], [address] Phone: [phone]   Fax:  [phone]  Name: [name] MRN:  [id number] Date of Birth: 10/17/1945

## 2016-12-27 ENCOUNTER — Ambulatory Visit (HOSPITAL_COMMUNITY)
Admission: RE | Admit: 2016-12-27 | Discharge: 2016-12-27 | Disposition: A | Discharge status: Home / Self Care | Payer: Medicare Other | Source: Ambulatory Visit | Attending: Neurology | Admitting: Neurology

## 2016-12-27 DIAGNOSIS — R1313 Dysphagia, pharyngeal phase: Secondary | ICD-10-CM | POA: Insufficient documentation

## 2016-12-27 DIAGNOSIS — R1319 Other dysphagia: Secondary | ICD-10-CM

## 2016-12-27 DIAGNOSIS — G20 Parkinson's disease: Secondary | ICD-10-CM | POA: Diagnosis not present

## 2016-12-27 DIAGNOSIS — R05 Cough: Secondary | ICD-10-CM | POA: Diagnosis not present

## 2016-12-27 DIAGNOSIS — R1311 Dysphagia, oral phase: Secondary | ICD-10-CM | POA: Diagnosis not present

## 2016-12-27 DIAGNOSIS — R1310 Dysphagia, unspecified: Secondary | ICD-10-CM

## 2016-12-27 NOTE — Progress Notes (Signed)
Please call patient or wife with results on the swallow study. He does have evidence of silent aspiration with thin liquids, recommendations are as follows: Honey thick liquids; Regular solids, use cup for liquids, no straw, meds to be taken with puree (not plain/thin liq). Techniques for eating/drinking: small sips/bites;Slow rate;Minimize environmental distractions;Clear throat intermittently;Hard cough after swallow. seated upright at 90 degrees  Thanks,

## 2016-12-27 NOTE — Progress Notes (Signed)
Modified Barium Swallow Progress Note  Patient Details  Name: [name] MRN: [id number] Date of Birth: Nov 24, 1945  Today's Date: 12/27/2016  Modified Barium Swallow completed.  Full report located under Chart Review in the Imaging Section.  Brief recommendations include the following:  Clinical Impression  Pt exhibits a mild oral and moderate-severe pharyngeal dysphagia. Oral phase impairments included premature spillage with honey thick liquids, however mastication of solids and mixed consistencies was timely and efficient. Mild vallecular residue was noted during trials of honey thick liquids due to reduced tongue base retraction. Mr. Worth experienced both silent aspiration and penetration with thin liquids and nectar thick liquids via cup despite postural techniques (chin tuck). Cued coughs/throat clears were not effective in clearing penetrates and aspirates. One instance of penetration occured above the level of the vocal cords with honey thick liquids due to large quantity/sip prevented by smaller sips. Recommend pt continue regular/soft solids (wife aware of which foods pose risks- avoids dry, crumbly foods, etc.), initiate honey thick liquids, meds whole in puree and volitional cough/throat clear. Educated pt and wife re: diet recommendations, thickened liquids, stressed compliance (risk of aspiration pneumonia) and emphasized the importance of small sips/bites. Pt advised on the free water protocol with instructions for thin liquids 30 min after meal, only after oral care and not with meds or during meals. Continued outpatient speech therapy would be beneficial for education and dysphagia treatment due to the progression of his Parkinsonism and its negative impact on swallowing.    Swallow Evaluation Recommendations   Recommended Consults: Other (Comment) (continued OP speech therapy )   SLP Diet Recommendations: Honey thick liquids;Regular solids   Liquid Administration via: Cup;No  straw   Medication Administration: Whole meds with puree   Supervision: Full supervision/cueing for compensatory strategies   Compensations: Small sips/bites;Slow rate;Minimize environmental distractions;Clear throat intermittently;Hard cough after swallow   Postural Changes: Seated upright at 90 degrees   Oral Care Recommendations: Oral care BID        Houston Siren 12/27/2016,3:46 PM   Orbie Pyo [place].[employer] [phone]

## 2016-12-28 ENCOUNTER — Ambulatory Visit: Payer: Medicare Other | Admitting: Physical Therapy

## 2016-12-28 ENCOUNTER — Ambulatory Visit: Payer: Medicare Other

## 2016-12-28 DIAGNOSIS — M6281 Muscle weakness (generalized): Secondary | ICD-10-CM

## 2016-12-28 DIAGNOSIS — R41841 Cognitive communication deficit: Secondary | ICD-10-CM

## 2016-12-28 DIAGNOSIS — R4701 Aphasia: Secondary | ICD-10-CM

## 2016-12-28 DIAGNOSIS — R1312 Dysphagia, oropharyngeal phase: Secondary | ICD-10-CM | POA: Diagnosis not present

## 2016-12-28 DIAGNOSIS — R2681 Unsteadiness on feet: Secondary | ICD-10-CM

## 2016-12-28 DIAGNOSIS — R29818 Other symptoms and signs involving the nervous system: Secondary | ICD-10-CM | POA: Diagnosis not present

## 2016-12-28 DIAGNOSIS — R2689 Other abnormalities of gait and mobility: Secondary | ICD-10-CM | POA: Diagnosis not present

## 2016-12-28 DIAGNOSIS — R471 Dysarthria and anarthria: Secondary | ICD-10-CM

## 2016-12-28 DIAGNOSIS — R29898 Other symptoms and signs involving the musculoskeletal system: Secondary | ICD-10-CM | POA: Diagnosis not present

## 2016-12-28 NOTE — Therapy (Signed)
[facility] [address] [place], [place], [address] Phone: [phone]   Fax:  [phone]  Physical Therapy Treatment  Patient Details  Name: [name] MRN: [id number] Date of Birth: Mar 03, 1946 Referring Provider: Rexene Alberts  Encounter Date: 12/28/2016      PT End of Session - 12/28/16 1250    Visit Number 3   Number of Visits 17   Date for PT Re-Evaluation 02/04/17   Authorization Type Medicare Primary, BCBS second-GCODE every 10th visit   PT Start Time 1153   PT Stop Time 1233   PT Time Calculation (min) 40 min   Equipment Utilized During Treatment Gait belt   Activity Tolerance Patient tolerated treatment well   Behavior During Therapy Impulsive;WFL for tasks assessed/performed      Past Medical History:  Diagnosis Date  . Allergic rhinitis   . Allergy   . Arthritis   . At risk for falls   . Cancer ([place])    basal cell skin cancer  . Cataract   . Complication of anesthesia    Slow to awaken  . Constipation   . DDD (degenerative disc disease), cervical   . Dysphagia   . Edema extremities 01/13/2016  . GERD (gastroesophageal reflux disease)    Decdreased, has decreased coffee intack.  . Glaucoma   . HTN (hypertension)   . Hx of adenomatous colonic polyps 01/23/2016  . Hyperlipidemia   . Hypogonadism male   . Hypothyroidism   . OSA (obstructive sleep apnea)    Has had surgery x 2  . Parkinsonism ([place]) 01/16/2013   no tremors, balance issues only , speech and swallowing difficulties   . Pre-diabetes   . Sleep apnea    surgery x 2- no cpap  . Staph infection 10-2014  . Thyroid disease    HYPOTHYROID    Past Surgical History:  Procedure Laterality Date  . CATARACT EXTRACTION    . COLONOSCOPY  04-27-2004   tics, hems   . I&D EXTREMITY Right 10/18/2014   Procedure: IRRIGATION AND DEBRIDEMENT RIGHT KNEE PREPATELLA BURSA INFECTION;  Surgeon: [name], MD;  Location: [facility];  Service: Orthopedics;   Laterality: Right;  . [place] SURGERY  04-2015  . LUMBAR LAMINECTOMY WITH COFLEX 1 LEVEL N/A 06/06/2015   Procedure: Lumbar three-four Laminectomy/Foraminotomy with placement of Coflex;  Surgeon: [name], MD;  Location: [facility];  Service: Neurosurgery;  Laterality: N/A;  . NASAL SEPTUM SURGERY    . TONSILLECTOMY     and adenoids  . UVULOPALATOPHARYNGOPLASTY      There were no vitals filed for this visit.      Subjective Assessment - 12/28/16 1154    Subjective Wife reports having swallow study yesterday:  on honey thick liquids now.   Patient is accompained by: Family member  wife, [name]   Pertinent History Neuropathy, Parkinson's/PSP   Patient Stated Goals Pt's goal for therapy is to help with walking, getting up and down out of chair and bed   Currently in Pain? No/denies                         [facility] Adult PT Treatment/Exercise - 12/28/16 0001      Transfers   Transfers Sit to Stand;Stand to Sit   Sit to Stand 4: Min guard;5: Supervision;From elevated surface;With upper extremity assist  simulating bed height at home   Stand to Sit 4: Min guard;5: Supervision;With upper extremity assist;To elevated  surface  simulating bed surface at home   Number of Reps 10 reps  Several reps to find optimal foot placement/forward lean   Transfer Cueing Cues for forward lean, foot placement tucked under knees, forward lean and upright stand   Comments For functional strengthening.     Knee/Hip Exercises: Aerobic   Nustep NuStep, Level 3>5, 4 extremities x 8 minutes, >70 steps/minute.  Pt c/o dizziness at end of use of NuStep, with pulse 60 bpm, dizziness subsides in 1-2 minutes.     Knee/Hip Exercises: Standing   Functional Squat Other (comment)   Functional Squat Limitations Attempted x 5 reps, but pt uses more trunk flexion than knee flexion   Wall Squat 1 set;10 reps;3 seconds;Limitations   Wall Squat Limitations Pt needs mod assistance of therapist to get  into position for wall squats   Other Standing Knee Exercises forward lean, push ups to mid-way stance position x 10 reps, with cues for less UE support and more use of co-contraction of quads and hamstrings.  Pt reports he feels his arms working most.     Knee/Hip Exercises: Seated   Long Arc Quad Strengthening;Right;Left;2 sets;10 reps  1st set no resistance, 2nd set green band   Heel Slides Strengthening;Right;Left;1 set;10 reps  green band                PT Education - 12/28/16 1249    Education provided Yes   Education Details HEP for additional quad strengthening   Person(s) Educated Patient;Spouse   Methods Explanation;Demonstration;Verbal cues   Comprehension Verbalized understanding;Returned demonstration;Verbal cues required;Need further instruction          PT Short Term Goals - 12/06/16 1954      PT SHORT TERM GOAL #1   Title Pt will perform HEP for strength, flexibility, balance and gait with wife's supervision.  TARGET 01/06/17   Time 4   Period Weeks   Status Achieved     PT SHORT TERM GOAL #2   Title Pt will perform 5x sit<>stand in less than or equal to 20 seconds (with minimal UE support and no posterior lean) for improved safety and efficiency with transfers.   Time 4   Period Weeks   Status New     PT SHORT TERM GOAL #3   Title Pt will improve TUG score to less than or equal to 23 seconds for decreased fall risk.   Time 4   Period Weeks   Status New     PT SHORT TERM GOAL #4   Title Pt/wife will report at least 25% improvement in bed mobility, for improved safety/decreased caregiver burden with bed moiblity.   Time 4   Period Weeks   Status New           PT Long Term Goals - 12/06/16 1956      PT LONG TERM GOAL #1   Title Pt/wife will verbalize understanding of fall prevention, including tips to reduce freezing.  TARGET 02/04/17   Time 8   Period Weeks   Status New     PT LONG TERM GOAL #2   Title Pt will improve Berg Balance  score to at least 25/56 for decreased fall risk.   Time 8   Period Weeks   Status New     PT LONG TERM GOAL #3   Title Pt will improve gait velocity to at least 2.3 ft/sec for improved efficiency and safety with gait.   Time 8   Period Weeks  Status New               Plan - 12/28/16 1250    Clinical Impression Statement Pt requests additional leg strengthening exercises, so treatment session focused on lower extremity strengthening in sitting and in standing.  Pt needs continued cues throughout exercises for proper technique.   Rehab Potential Fair   Clinical Impairments Affecting Rehab Potential decreased safety awareness    PT Frequency 2x / week   PT Duration 8 weeks  plus eval   PT Treatment/Interventions ADLs/Self Care Home Management;Functional mobility training;Gait training;Patient/family education;Orthotic Fit/Training;Neuromuscular re-education;Balance training;Therapeutic exercise;Therapeutic activities   PT Next Visit Plan Review of HEP; continue to add strengthening and stretches as needed; work on transfer training and bed mobility   Consulted and Agree with Plan of Care Patient;Family member/caregiver   Family Member [employer]      Patient will benefit from skilled therapeutic intervention in order to improve the following deficits and impairments:  Abnormal gait, Decreased balance, Decreased mobility, Decreased range of motion, Decreased safety awareness, Decreased strength, Difficulty walking, Impaired flexibility, Postural dysfunction  Visit Diagnosis: Muscle weakness (generalized)  Unsteadiness on feet     Problem List Patient Active Problem List   Diagnosis Date Noted  . Peripheral neuropathy ([place]) 11/14/2016  . PSP (progressive supranuclear palsy) ([place]) 11/08/2016  . Hx of adenomatous colonic polyps 01/23/2016  . Edema extremities 01/13/2016  . Lumbar stenosis 06/06/2015  . HOCM (hypertrophic obstructive cardiomyopathy) ([place]) 12/12/2014   . Infection of right prepatellar bursa 10/18/2014  . DOE (dyspnea on exertion) 09/06/2014  . Fatigue due to excessive exertion 09/06/2014  . Testosterone deficiency 11/12/2013  . Routine general medical examination at a health care facility 08/30/2013  . Mixed hyperlipidemia 08/30/2013  . Other abnormal glucose 08/30/2013  . 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 Parkinsonism ([place]) 01/16/2013  . INSOMNIA 06/06/2008  . GLAUCOMA 06/01/2008  . Essential hypertension 06/01/2008  . Seasonal and perennial allergic rhinitis 06/01/2008    Zeynab Klett W. 12/28/2016, 12:52 PM  Frazier Butt., PT  [facility] [name] [place], [place], [address] Phone: [phone]   Fax:  [phone]  Name: Gwendolyn Nishi MRN: [id number] Date of Birth: [birth year]

## 2016-12-28 NOTE — Patient Instructions (Signed)
   Do these twice a day:   Effortful swallow 20x   Tongue out swallow 20x   Towel hold x3 for 60 seconds each

## 2016-12-28 NOTE — Therapy (Signed)
[facility] [address] [place], [place], [address] Phone: [phone]   Fax:  [phone]  Speech Language Pathology Treatment  Patient Details  Name: [name] MRN: [id number] Date of Birth: 1946-06-16 Referring Provider: Alonza Bogus, DO  Encounter Date: 12/28/2016      End of Session - 12/28/16 1704    Visit Number 2   Number of Visits 9   Date for SLP Re-Evaluation 02/01/17   SLP Start Time 35   SLP Stop Time  1401   SLP Time Calculation (min) 43 min   Activity Tolerance Patient tolerated treatment well      Past Medical History:  Diagnosis Date  . Allergic rhinitis   . Allergy   . Arthritis   . At risk for falls   . Cancer ([place])    basal cell skin cancer  . Cataract   . Complication of anesthesia    Slow to awaken  . Constipation   . DDD (degenerative disc disease), cervical   . Dysphagia   . Edema extremities 01/13/2016  . GERD (gastroesophageal reflux disease)    Decdreased, has decreased coffee intack.  . Glaucoma   . HTN (hypertension)   . Hx of adenomatous colonic polyps 01/23/2016  . Hyperlipidemia   . Hypogonadism male   . Hypothyroidism   . OSA (obstructive sleep apnea)    Has had surgery x 2  . Parkinsonism ([place]) 01/16/2013   no tremors, balance issues only , speech and swallowing difficulties   . Pre-diabetes   . Sleep apnea    surgery x 2- no cpap  . Staph infection 10-2014  . Thyroid disease    HYPOTHYROID    Past Surgical History:  Procedure Laterality Date  . CATARACT EXTRACTION    . COLONOSCOPY  04-27-2004   tics, hems   . I&D EXTREMITY Right 10/18/2014   Procedure: IRRIGATION AND DEBRIDEMENT RIGHT KNEE PREPATELLA BURSA INFECTION;  Surgeon: [name], MD;  Location: [facility];  Service: Orthopedics;  Laterality: Right;  . [place] SURGERY  04-2015  . LUMBAR LAMINECTOMY WITH COFLEX 1 LEVEL N/A 06/06/2015   Procedure: Lumbar three-four Laminectomy/Foraminotomy with  placement of Coflex;  Surgeon: [name], MD;  Location: [facility];  Service: Neurosurgery;  Laterality: N/A;  . NASAL SEPTUM SURGERY    . TONSILLECTOMY     and adenoids  . UVULOPALATOPHARYNGOPLASTY      There were no vitals filed for this visit.      Subjective Assessment - 12/28/16 1435    Subjective "Did you see his swallowing test yesterday?" - wife   Patient is accompained by: --  wife, [name]   Currently in Pain? No/denies   Pain Onset More than a month ago               ADULT SLP TREATMENT - 12/28/16 1436      General Information   Behavior/Cognition Cooperative;Pleasant mood;Impulsive;Decreased sustained attention     Treatment Provided   Treatment provided Dysphagia     Dysphagia Treatment   Temperature Spikes Noted No   Respiratory Status Room air   Oral Cavity - Dentition Adequate natural dentition   Treatment Methods Skilled observation;Therapeutic exercise;Compensation strategy training;Patient/caregiver education   Patient observed directly with PO's Yes   Type of PO's observed Honey-thick liquids   Feeding Able to feed self;Needs assist   Liquids provided via Straw   Oral Phase Signs & Symptoms Oral holding   Pharyngeal Phase Signs &  Symptoms Wet vocal quality   Type of cueing Verbal;Visual   Amount of cueing Moderate   Other treatment/comments SLP inquired pt's wife re: straw as it was not recommended after modified (MBSS), and wife states if pt does not have straw he takes in too large of sips, and then coughs. SLP assessed pt with straw today and minimal wet voice heard x1/7 sips H2o thickened to honey.  Pt req'd consistent min cues for cough/throat clear following sips, pt had dificulty with strong/effective cough. SLP eduated pt's wife and pt on what was honey consistentcy as pts drink today was nectar. SLP took thickener and made pt's liquid honey-thick. SLP provided information on honey-thick drinks/smoothies for pt/wife. SLP demonstrated HEP  exercises for pt and he completed with min A rarely.     Assessment / Recommendations / Plan   Plan Continue with current plan of care          SLP Education - 12/28/16 1703    Education provided Yes   Education Details HEP for swallowing, honey consistency liquids/smoothies, consistency of honey-thick, risk of aspiration PNA   Person(s) Educated Spouse;Patient   Methods Explanation;Demonstration;Handout;Verbal cues   Comprehension Verbalized understanding;Returned demonstration;Verbal cues required;Need further instruction          SLP Short Term Goals - 12/06/16 1733      SLP SHORT TERM GOAL #2   Title --          SLP Long Term Goals - 12/28/16 1708      SLP LONG TERM GOAL #1   Title pt will perform HEP with usual mod A   Time 4   Period Weeks  or visits   Status New     SLP LONG TERM GOAL #2   Title pt or wife will demo understanding of 4 s/s aspiration PNA   Time 4   Period Weeks  or visits   Status New     SLP LONG TERM GOAL #3   Title to benefit pt's pulmonary health, wife will demo appropriate cueing for pt during POs over two sessions   Time 4   Period Weeks  or visits   Status New     SLP LONG TERM GOAL #4   Title pt will produce a set of 10 phrases/sentences with 80% intelligibility over two sessions   Time 4   Period Weeks  or visits   Status New          Plan - 12/28/16 1704    Clinical Impression Statement Pt with modified barium swallow (MBSS) yesterday, with regular diet and honey liquids rec due to SILENT aspiration with thin and nectar. Pt is OK with straws as his intake is less than with cup sip and can be better controlled. Also, pt has weak cough/throat clear. Speech volume was audible approx 50% today in pt's spontaneous utternaces, when asked to speak up pt did not/was unable to do so. Skilled ST rmains necessary for safety with POs/mitigate aspiration risk, and to attempt to improve pt's ability to communicate with wife.   Speech  Therapy Frequency 1x /week   Duration 4 weeks   Treatment/Interventions Aspiration precaution training;Diet toleration management by SLP;Cueing hierarchy;Patient/family education;SLP instruction and feedback;Internal/external aids;Compensatory techniques;Environmental controls;Pharyngeal strengthening exercises  any or all may be used; pharyngeal strengthening only if pt decides to accept help with HEP    Potential to Achieve Goals Fair   Potential Considerations Previous level of function;Ability to learn/carryover information;Severity of impairments;Cooperation/participation level   Consulted  and Agree with Plan of Care Patient;Family member/caregiver   Family Member Consulted wife      Patient will benefit from skilled therapeutic intervention in order to improve the following deficits and impairments:   Oropharyngeal dysphagia  Dysarthria and anarthria  Cognitive communication deficit  Aphasia    Problem List Patient Active Problem List   Diagnosis Date Noted  . Peripheral neuropathy ([place]) 11/14/2016  . PSP (progressive supranuclear palsy) ([place]) 11/08/2016  . Hx of adenomatous colonic polyps 01/23/2016  . Edema extremities 01/13/2016  . Lumbar stenosis 06/06/2015  . HOCM (hypertrophic obstructive cardiomyopathy) ([place]) 12/12/2014  . Infection of right prepatellar bursa 10/18/2014  . DOE (dyspnea on exertion) 09/06/2014  . Fatigue due to excessive exertion 09/06/2014  . Testosterone deficiency 11/12/2013  . Routine general medical examination at a health care facility 08/30/2013  . Mixed hyperlipidemia 08/30/2013  . Other abnormal glucose 08/30/2013  . 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 Parkinsonism ([place]) 01/16/2013  . INSOMNIA 06/06/2008  . GLAUCOMA 06/01/2008  . Essential hypertension 06/01/2008  . Seasonal and perennial allergic rhinitis 06/01/2008    [facility] ,MS, CCC-SLP  12/28/2016, 5:09 PM  [facility] [address] [place], [place], [address] Phone: [phone]   Fax:  [phone]   Name: [name] MRN: [id number] Date of Birth: 24-Sep-1946

## 2016-12-28 NOTE — Patient Instructions (Addendum)
   Chair Knee Flexion    Keeping feet on floor, pull your left leg back against the resistance of the band.  Hold __3__ seconds.  Then slowly relax out.  Repeat _10___ times, 2 sets. Do __1__ session a day.  http://gt2.exer.us/304   Copyright  VHI. All rights reserved.  HIP / KNEE: Extension - Sit to Stand    Sitting, scoot out to the edge of your bed.  Tuck your feet under your knees, keeping your feet flat.  Lean chest forward, raise hips up from surface. Straighten hips and knees. Stand tall for 3 seconds, then stick your bottom out and slowly squat down to sit. Backs of legs should not push off surface. _10__ reps per set, _1-2__ sets per day.  Use assistive device as needed.  Copyright  VHI. All rights reserved.  KNEE: Extension, Long Arc Quads - Sitting    Raise leg until knee is straight against the resistance of the band.  Hold your leg straight for 3 seconds.  Then slowly relax down. _10__ reps per set, _2__ sets per day.  Copyright  VHI. All rights reserved.

## 2016-12-31 ENCOUNTER — Telehealth: Payer: Self-pay

## 2016-12-31 ENCOUNTER — Other Ambulatory Visit: Payer: Self-pay | Admitting: Internal Medicine

## 2016-12-31 MED ORDER — LEVOTHYROXINE SODIUM 50 MCG PO TABS: 50.0000 ug | ORAL_TABLET | Freq: Every day | ORAL | 1 refills | Status: DC

## 2016-12-31 NOTE — Telephone Encounter (Signed)
-----   Message from Star Age, MD sent at 12/27/2016  4:38 PM EDT ----- Please call patient or wife with results on the swallow study. He does have evidence of silent aspiration with thin liquids, recommendations are as follows: Honey thick liquids; Regular solids, use cup for liquids, no straw, meds to be taken with puree (not plain/thin liq). Techniques for eating/drinking: small sips/bites;Slow rate;Minimize environmental distractions;Clear throat intermittently;Hard cough after swallow. seated upright at 90 degrees  Thanks,

## 2016-12-31 NOTE — Telephone Encounter (Signed)
I spoke to wife, she states that they were given the results and recommendations below after study. I asked her to call back if she needs anything else.

## 2017-01-07 ENCOUNTER — Ambulatory Visit: Payer: Medicare Other | Admitting: Occupational Therapy

## 2017-01-07 ENCOUNTER — Ambulatory Visit: Payer: Medicare Other | Attending: Neurology | Admitting: Physical Therapy

## 2017-01-07 ENCOUNTER — Encounter: Payer: Self-pay | Admitting: Physical Therapy

## 2017-01-07 VITALS — BP 112/69 | HR 54

## 2017-01-07 DIAGNOSIS — M25611 Stiffness of right shoulder, not elsewhere classified: Secondary | ICD-10-CM | POA: Insufficient documentation

## 2017-01-07 DIAGNOSIS — R41841 Cognitive communication deficit: Secondary | ICD-10-CM | POA: Diagnosis not present

## 2017-01-07 DIAGNOSIS — M25511 Pain in right shoulder: Secondary | ICD-10-CM | POA: Insufficient documentation

## 2017-01-07 DIAGNOSIS — R29898 Other symptoms and signs involving the musculoskeletal system: Secondary | ICD-10-CM

## 2017-01-07 DIAGNOSIS — R471 Dysarthria and anarthria: Secondary | ICD-10-CM | POA: Insufficient documentation

## 2017-01-07 DIAGNOSIS — R4701 Aphasia: Secondary | ICD-10-CM | POA: Insufficient documentation

## 2017-01-07 DIAGNOSIS — R2681 Unsteadiness on feet: Secondary | ICD-10-CM | POA: Diagnosis not present

## 2017-01-07 DIAGNOSIS — R41844 Frontal lobe and executive function deficit: Secondary | ICD-10-CM

## 2017-01-07 DIAGNOSIS — R278 Other lack of coordination: Secondary | ICD-10-CM

## 2017-01-07 DIAGNOSIS — R29818 Other symptoms and signs involving the nervous system: Secondary | ICD-10-CM

## 2017-01-07 DIAGNOSIS — R1312 Dysphagia, oropharyngeal phase: Secondary | ICD-10-CM | POA: Insufficient documentation

## 2017-01-07 DIAGNOSIS — R2689 Other abnormalities of gait and mobility: Secondary | ICD-10-CM

## 2017-01-07 DIAGNOSIS — M6281 Muscle weakness (generalized): Secondary | ICD-10-CM

## 2017-01-07 NOTE — Therapy (Signed)
[facility] [address] [place], [place], [address] Phone: [phone]   Fax:  [phone]  Occupational Therapy Treatment  Patient Details  Name: [name] MRN: [id number] Date of Birth: 22-Apr-1946 Referring Provider: Dr. Star Age  Encounter Date: 01/07/2017      OT End of Session - 01/07/17 1317    Visit Number 3   Number of Visits 17   Date for OT Re-Evaluation 02/04/17   Authorization Type Medicare / BCBS, G-code needed   Authorization Time Period pt currently scheduled through 01/24/17   Authorization - Visit Number 3   Authorization - Number of Visits 10   OT Start Time 1443   OT Stop Time 1400   OT Time Calculation (min) 42 min   Activity Tolerance Patient tolerated treatment well   Behavior During Therapy Impulsive;Restless      Past Medical History:  Diagnosis Date  . Allergic rhinitis   . Allergy   . Arthritis   . At risk for falls   . Cancer ([facility])    basal cell skin cancer  . Cataract   . Complication of anesthesia    Slow to awaken  . Constipation   . DDD (degenerative disc disease), cervical   . Dysphagia   . Edema extremities 01/13/2016  . GERD (gastroesophageal reflux disease)    Decdreased, has decreased coffee intack.  . Glaucoma   . HTN (hypertension)   . Hx of adenomatous colonic polyps 01/23/2016  . Hyperlipidemia   . Hypogonadism male   . Hypothyroidism   . OSA (obstructive sleep apnea)    Has had surgery x 2  . Parkinsonism ([place]) 01/16/2013   no tremors, balance issues only , speech and swallowing difficulties   . Pre-diabetes   . Sleep apnea    surgery x 2- no cpap  . Staph infection 10-2014  . Thyroid disease    HYPOTHYROID    Past Surgical History:  Procedure Laterality Date  . CATARACT EXTRACTION    . COLONOSCOPY  04-27-2004   tics, hems   . I&D EXTREMITY Right 10/18/2014   Procedure: IRRIGATION AND DEBRIDEMENT RIGHT KNEE PREPATELLA BURSA INFECTION;  Surgeon:  [name], MD;  Location: [facility];  Service: Orthopedics;  Laterality: Right;  . [place] SURGERY  04-2015  . LUMBAR LAMINECTOMY WITH COFLEX 1 LEVEL N/A 06/06/2015   Procedure: Lumbar three-four Laminectomy/Foraminotomy with placement of Coflex;  Surgeon: [name], MD;  Location: [facility];  Service: Neurosurgery;  Laterality: N/A;  . NASAL SEPTUM SURGERY    . TONSILLECTOMY     and adenoids  . UVULOPALATOPHARYNGOPLASTY      Vitals:   01/07/17 1344  BP: 112/69  Pulse: (!) 54        Subjective Assessment - 01/07/17 1347    Subjective  Pt reports dizziness with lying in supine and performing shoulder exercises.  Therefore, discontinued, but dizziness/lightheadedness persisted during session.   Patient is accompained by: Family member  wife [name]   Pertinent History Progressive Supranuclear Palsy (PSP), hx of falls, cognitive deficits, glaucoma, lumbar stenosis, peripheral neuropathy, **AVOID SUPINE SHOULDER EXERCISES AS PT REPORTS DIZZINESS**   Limitations fall risk, cognitive deficits, impulsivity   Patient Stated Goals brushing hair, putting on deodorant, pulling on shirt, writing numbers   Currently in Pain? Yes  BOTH SHOULDER SORE, BUT UNABLE TO DESCRIBE ANY FURTHER  "sorry" per pt      In supine, attempted AAROM shoulder flex with ball with BUEs with  gaze stabilization and pt denied dizziness, but then reported dizziness with supine>sit which persisted in varying degrees throughout the session (4-5/10 rated).  Attempted BUE shoulder flex with ball in sitting, but pt with trunk/head movements despite cueing for gaze stabilization and trunk stabilization.  Pt reported incr dizziness/lightheadedness; therefore, discontinued.  Checked BP in sitting--see above.  Writing name with good size and legibility.  Writing phone numbers with good size/legibility except at bottom of page (min-mod cues given at bottom of page only (recommended pt start new page when he gets toward the  bottom).  Practiced copying phone numbers and writing once given verbally.    Simulated washing hair with mod cues (verbal/demo) for incr movement amplitude/timing.  Attempted to comb hair, but mod-max difficulty and static affected.   Using hand sanitizer with min-mod cues (verbal/demo) for incr movement amplitude/timing   Twisting cap on/off with min-mod cues (verbal/demo/tactile) for incr movement amplitude/strategies/timing, then with repetition, pt demo improvement.    In sitting, donning/doffing coat.  Pt donned with good movement and ease, but reports bilateral shoulder discomfort with doffing despite strategies for trunk rotation with doffing.  Pt unable to don on 2nd attempt due to shoulder pain and did not want to attempt anymore today.                     OT Education - 01/07/17 1422    Education Details discontinue shoulder HEP in supine due to reports of dizziness/lightheadness.   Person(s) Educated Patient;Spouse   Methods Explanation   Comprehension Verbalized understanding          OT Short Term Goals - 12/06/16 1311      OT SHORT TERM GOAL #1   Title Pt will perform updated HEP with supervision/min cues.--check STGs 01/04/17   Time 4   Period Weeks   Status New     OT SHORT TERM GOAL #2   Title Pt will wash/dry hands adequately with min cueing.   Time 4   Period Weeks   Status New     OT SHORT TERM GOAL #3   Title Pt will report incr ease with washing hair.   Baseline -----   Period Weeks   Status New     OT SHORT TERM GOAL #4   Title Pt will be able to put deodorant lid on/off and apply with min v.c. for movement amplitude.   Baseline -----   Time 4   Period Weeks   Status New     OT SHORT TERM GOAL #5   Title ----------   Baseline -------   Time --   Period --   Status --           OT Long Term Goals - 01/07/17 1410      OT LONG TERM GOAL #1   Title Pt/caregiver will verbalize understanding of adaptive strategies/AE for  ADLs prn.--check LTGs 02/04/17   Time 8   Period Weeks   Status New     OT LONG TERM GOAL #2   Title Pt will demonstrate improved RUE functional use as evidenced by increasing box/ blocks score to 40 blocks   Baseline 35   Period Weeks   Status New     OT LONG TERM GOAL #3   Title Pt will write phone number once given verbally with at least 90% legibility.   Time 8   Period Weeks   Status On-going  01/07/17  performed at approx this level, will monitor for  consistency     OT LONG TERM GOAL #4   Title Pt will be able to brush his hair with min cues for movement amplitude.   Time 8   Period Weeks   Status New     OT LONG TERM GOAL #5   Title Pt will report improved shoulder pain as shown by ability to don/doff jacket mod I.   Baseline unable to doff due to R shoulder pain   Time 8   Period Weeks   Status New               Plan - 01/07/17 1408    Clinical Impression Statement Dizziness reported today when in supine with shoulder flex despite gaze stabilization and persisted during session.  Pt with good legibility with writing phone numbers today.   Rehab Potential Good   Clinical Impairments Affecting Rehab Potential cognitive deficits, impulsivity   OT Frequency 2x / week   OT Duration 8 weeks  +eval   OT Treatment/Interventions Self-care/ADL training;Cryotherapy;Energy conservation;Neuromuscular education;Functional Mobility Training;Manual Therapy;Passive range of motion;Cognitive remediation/compensation;Therapeutic activities;Visual/perceptual remediation/compensation;Therapeutic exercises;Therapeutic exercise;Patient/family education;DME and/or AE instruction;Moist Heat;Balance training   Plan adaptive strategies for ADLs (jacket, washing hair, combing hair, washing hands, etc); simple shoulder HEP if tolerated   OT Home Exercise Plan Education provided:  12/25/16 shoulder flex with ball in supine, PWR! up in supine--d/c 01/07/17   Consulted and Agree with Plan of Care  Patient;Family member/caregiver   Family Member Consulted wife--[name]      Patient will benefit from skilled therapeutic intervention in order to improve the following deficits and impairments:  Decreased coordination, Decreased range of motion, Decreased safety awareness, Decreased knowledge of precautions, Impaired tone, Impaired UE functional use, Pain, Impaired perceived functional ability, Decreased mobility, Decreased cognition, Decreased balance, Decreased knowledge of use of DME (Hypokinesia)  Visit Diagnosis: Other symptoms and signs involving the nervous system  Other symptoms and signs involving the musculoskeletal system  Other lack of coordination  Other abnormalities of gait and mobility  Frontal lobe and executive function deficit  Right shoulder pain, unspecified chronicity  Stiffness of right shoulder, not elsewhere classified    Problem List Patient Active Problem List   Diagnosis Date Noted  . Peripheral neuropathy ([place]) 11/14/2016  . PSP (progressive supranuclear palsy) ([facility]) 11/08/2016  . Hx of adenomatous colonic polyps 01/23/2016  . Edema extremities 01/13/2016  . Lumbar stenosis 06/06/2015  . HOCM (hypertrophic obstructive cardiomyopathy) ([facility]) 12/12/2014  . Infection of right prepatellar bursa 10/18/2014  . DOE (dyspnea on exertion) 09/06/2014  . Fatigue due to excessive exertion 09/06/2014  . Testosterone deficiency 11/12/2013  . Routine general medical examination at a health care facility 08/30/2013  . Mixed hyperlipidemia 08/30/2013  . Other abnormal glucose 08/30/2013  . 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 Parkinsonism ([place]) 01/16/2013  . INSOMNIA 06/06/2008  . GLAUCOMA 06/01/2008  . Essential hypertension 06/01/2008  . Seasonal and perennial allergic rhinitis 06/01/2008    [facility] 01/07/2017, 2:23 PM  [facility] [address] [place], [place], [address] Phone:  [phone]   Fax:  [phone]  Name: Arslan Kier MRN: [id number] Date of Birth: [birth year]   Vianne Bulls, OTR/L [facility] [address] [place], [place]  [address] [phone] phone [phone] 01/07/17 2:25 PM

## 2017-01-07 NOTE — Therapy (Signed)
[facility] [address] [place], [place], [address] Phone: [phone]   Fax:  [phone]  Physical Therapy Treatment  Patient Details  Name: [name] MRN: [id number] Date of Birth: 11/04/1945 Referring Provider: Rexene Alberts  Encounter Date: 01/07/2017      PT End of Session - 01/07/17 1459    Visit Number 4   Number of Visits 17   Date for PT Re-Evaluation 02/04/17   Authorization Type Medicare Primary, BCBS second-GCODE every 10th visit   PT Start Time 1402   PT Stop Time 1437  left early due to illness   PT Time Calculation (min) 35 min   Equipment Utilized During Treatment Gait belt   Activity Tolerance Treatment limited secondary to medical complications (Comment)  dizziness   Behavior During Therapy Impulsive;WFL for tasks assessed/performed      Past Medical History:  Diagnosis Date  . Allergic rhinitis   . Allergy   . Arthritis   . At risk for falls   . Cancer ([facility])    basal cell skin cancer  . Cataract   . Complication of anesthesia    Slow to awaken  . Constipation   . DDD (degenerative disc disease), cervical   . Dysphagia   . Edema extremities 01/13/2016  . GERD (gastroesophageal reflux disease)    Decdreased, has decreased coffee intack.  . Glaucoma   . HTN (hypertension)   . Hx of adenomatous colonic polyps 01/23/2016  . Hyperlipidemia   . Hypogonadism male   . Hypothyroidism   . OSA (obstructive sleep apnea)    Has had surgery x 2  . Parkinsonism ([place] [facility]) 01/16/2013   no tremors, balance issues only , speech and swallowing difficulties   . Pre-diabetes   . Sleep apnea    surgery x 2- no cpap  . Staph infection 10-2014  . Thyroid disease    HYPOTHYROID    Past Surgical History:  Procedure Laterality Date  . CATARACT EXTRACTION    . COLONOSCOPY  04-27-2004   tics, hems   . I&D EXTREMITY Right 10/18/2014   Procedure: IRRIGATION AND DEBRIDEMENT RIGHT KNEE PREPATELLA BURSA INFECTION;   Surgeon: [name], MD;  Location: [facility];  Service: Orthopedics;  Laterality: Right;  . [place] SURGERY  04-2015  . LUMBAR LAMINECTOMY WITH COFLEX 1 LEVEL N/A 06/06/2015   Procedure: Lumbar three-four Laminectomy/Foraminotomy with placement of Coflex;  Surgeon: [name], MD;  Location: [facility];  Service: Neurosurgery;  Laterality: N/A;  . NASAL SEPTUM SURGERY    . TONSILLECTOMY     and adenoids  . UVULOPALATOPHARYNGOPLASTY      There were no vitals filed for this visit.      Subjective Assessment - 01/07/17 1347    Subjective Pt/wife/OT report that pt was having episodes of dizziness during OT session. OT ?saw vertical nystagmus and reported gaze stabilization did not seem to help him settle down.    Patient is accompained by: Family member  wife, [name]   Pertinent History Neuropathy, Parkinson's/PSP   Patient Stated Goals Pt's goal for therapy is to help with walking, getting up and down out of chair and bed   Currently in Pain? No/denies                         [facility] Adult PT Treatment/Exercise - 01/07/17 1449      Transfers   Transfers Sit to Stand;Stand to [employer]  to Stand 4: Min guard;5: Supervision;From elevated surface;With upper extremity assist     Stand to Sit 4: Min guard;5: Supervision;With upper extremity assist;To elevated surface     Stand to Sit Details --   Stand Pivot Transfers 4: Min assist   Stand Pivot Transfer Details (indicate cue type and reason) x 5; min assist for balance when his rt foot lagged behind; pt initially very impulsive and begins to turn prior to achieving upright posture; by end was standing upright, using weight-shifting/marching prior to stepping   Number of Reps 1 set;10 reps   Transfer Cueing pt able to verbalize proper steps to take, however required questioning cues x 2   Comments --     Ambulation/Gait   Ambulation/Gait Assistance 4: Min guard   Ambulation/Gait Assistance  Details Patient reporting feeling dizzy/light-headed (4/10) and yet repeatedly asked to walk outside. [place] for safety with no loss of balance.    Ambulation Distance (Feet) 110 Feet   Assistive device Rolling walker   Gait Pattern Step-through pattern;Decreased step length - right;Decreased step length - left;Decreased dorsiflexion - right;Shuffle;Festinating;Trunk flexed;Narrow base of support   Ambulation Surface Level;Indoor         Knee/Hip Exercises: Seated   Long Arc Quad Strengthening;Right;Left;10 reps;1 set  green band   Heel Slides Strengthening;Right;Left;1 set;10 reps  green band                PT Education - 01/07/17 1457    Education provided Yes   Education Details HEP from 3/23 for accuracy; techniques to incr safety with stand pivot   Person(s) Educated Patient;Spouse   Methods Explanation;Demonstration;Tactile cues;Verbal cues   Comprehension Verbalized understanding;Returned demonstration;Verbal cues required;Tactile cues required;Need further instruction          PT Short Term Goals - 12/06/16 1954      PT SHORT TERM GOAL #1   Title Pt will perform HEP for strength, flexibility, balance and gait with wife's supervision.  TARGET 01/06/17   Time 4   Period Weeks   Status Achieved     PT SHORT TERM GOAL #2   Title Pt will perform 5x sit<>stand in less than or equal to 20 seconds (with minimal UE support and no posterior lean) for improved safety and efficiency with transfers.   Time 4   Period Weeks   Status New     PT SHORT TERM GOAL #3   Title Pt will improve TUG score to less than or equal to 23 seconds for decreased fall risk.   Time 4   Period Weeks   Status New     PT SHORT TERM GOAL #4   Title Pt/wife will report at least 25% improvement in bed mobility, for improved safety/decreased caregiver burden with bed moiblity.   Time 4   Period Weeks   Status New           PT Long Term Goals - 12/06/16 1956      PT LONG TERM GOAL  #1   Title Pt/wife will verbalize understanding of fall prevention, including tips to reduce freezing.  TARGET 02/04/17   Time 8   Period Weeks   Status New     PT LONG TERM GOAL #2   Title Pt will improve Berg Balance score to at least 25/56 for decreased fall risk.   Time 8   Period Weeks   Status New     PT LONG TERM GOAL #3   Title Pt will improve gait velocity  to at least 2.3 ft/sec for improved efficiency and safety with gait.   Time 8   Period Weeks   Status New               Plan - 01/07/17 1500    Clinical Impression Statement Patient with intermittent reports of dizziness with standing throughout PT session (see OT note as pt also experienced dizziness with OT). Wife present throughout. Session focused on ensuring accuracy with HEP, leg strengthening, safety with transfers, and gait training. Patient reported "light-headed" feeling with standing activities (BP checked during OT without issues). Would improved with gaze stablilization using pen ~24" in front of pt (4 down to 2 to 0 over ~2 minutes). Ultimately pt asked to leave session ~8 minutes early due to not feeling well. Offered to walk pt to the lobby so his wife could pull the car around to the entrance which both pt/wife refused.    Rehab Potential Fair   Clinical Impairments Affecting Rehab Potential decreased safety awareness    PT Frequency 2x / week   PT Duration 8 weeks  plus eval   PT Treatment/Interventions ADLs/Self Care Home Management;Functional mobility training;Gait training;Patient/family education;Orthotic Fit/Training;Neuromuscular re-education;Balance training;Therapeutic exercise;Therapeutic activities   PT Next Visit Plan continue to add strengthening and stretches as needed; work on transfer training and bed mobility; IMPULSIVE    Consulted and Agree with Plan of Care Patient;Family member/caregiver   Family Member [employer]      Patient will benefit from skilled therapeutic  intervention in order to improve the following deficits and impairments:  Abnormal gait, Decreased balance, Decreased mobility, Decreased range of motion, Decreased safety awareness, Decreased strength, Difficulty walking, Impaired flexibility, Postural dysfunction  Visit Diagnosis: Muscle weakness (generalized)  Unsteadiness on feet  Other symptoms and signs involving the nervous system  Other symptoms and signs involving the musculoskeletal system     Problem List Patient Active Problem List   Diagnosis Date Noted  . Peripheral neuropathy ([place]) 11/14/2016  . PSP (progressive supranuclear palsy) ([place]) 11/08/2016  . Hx of adenomatous colonic polyps 01/23/2016  . Edema extremities 01/13/2016  . Lumbar stenosis 06/06/2015  . HOCM (hypertrophic obstructive cardiomyopathy) ([place]) 12/12/2014  . Infection of right prepatellar bursa 10/18/2014  . DOE (dyspnea on exertion) 09/06/2014  . Fatigue due to excessive exertion 09/06/2014  . Testosterone deficiency 11/12/2013  . Routine general medical examination at a health care facility 08/30/2013  . Mixed hyperlipidemia 08/30/2013  . Other abnormal glucose 08/30/2013  . 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 Parkinsonism ([place]) 01/16/2013  . INSOMNIA 06/06/2008  . GLAUCOMA 06/01/2008  . Essential hypertension 06/01/2008  . Seasonal and perennial allergic rhinitis 06/01/2008    Rexanne Mano, PT 01/07/2017, 3:10 PM  [facility] [address] [place], [place], [address] Phone: [phone]   Fax:  [phone]  Name: [name] MRN: [id number] Date of Birth: 30-Nov-1945

## 2017-01-09 ENCOUNTER — Ambulatory Visit: Payer: Medicare Other

## 2017-01-09 ENCOUNTER — Ambulatory Visit: Payer: Medicare Other | Admitting: Physical Therapy

## 2017-01-09 ENCOUNTER — Ambulatory Visit: Payer: Medicare Other | Admitting: Occupational Therapy

## 2017-01-09 DIAGNOSIS — R2689 Other abnormalities of gait and mobility: Secondary | ICD-10-CM

## 2017-01-09 DIAGNOSIS — R4701 Aphasia: Secondary | ICD-10-CM

## 2017-01-09 DIAGNOSIS — R2681 Unsteadiness on feet: Secondary | ICD-10-CM | POA: Diagnosis not present

## 2017-01-09 DIAGNOSIS — R471 Dysarthria and anarthria: Secondary | ICD-10-CM

## 2017-01-09 DIAGNOSIS — R29818 Other symptoms and signs involving the nervous system: Secondary | ICD-10-CM

## 2017-01-09 DIAGNOSIS — M6281 Muscle weakness (generalized): Secondary | ICD-10-CM

## 2017-01-09 DIAGNOSIS — M25511 Pain in right shoulder: Secondary | ICD-10-CM

## 2017-01-09 DIAGNOSIS — R41841 Cognitive communication deficit: Secondary | ICD-10-CM

## 2017-01-09 DIAGNOSIS — M25611 Stiffness of right shoulder, not elsewhere classified: Secondary | ICD-10-CM

## 2017-01-09 DIAGNOSIS — R278 Other lack of coordination: Secondary | ICD-10-CM

## 2017-01-09 DIAGNOSIS — R29898 Other symptoms and signs involving the musculoskeletal system: Secondary | ICD-10-CM | POA: Diagnosis not present

## 2017-01-09 DIAGNOSIS — R1312 Dysphagia, oropharyngeal phase: Secondary | ICD-10-CM

## 2017-01-09 DIAGNOSIS — R41844 Frontal lobe and executive function deficit: Secondary | ICD-10-CM

## 2017-01-09 NOTE — Therapy (Signed)
[facility] [address] [place], [place], [address] Phone: [phone]   Fax:  [phone]  Physical Therapy Treatment  Patient Details  Name: [name] MRN: [id number] Date of Birth: [birth year] Referring Provider: Rexene Alberts  Encounter Date: 01/09/2017      PT End of Session - 01/09/17 2051    Visit Number 5   Number of Visits 17   Date for PT Re-Evaluation 02/04/17   Authorization Type Medicare Primary, BCBS second-GCODE every 10th visit   PT Start Time 1024   PT Stop Time 1103   PT Time Calculation (min) 39 min   Equipment Utilized During Treatment Gait belt   Activity Tolerance Treatment limited secondary to medical complications (Comment);Patient tolerated treatment well   Behavior During Therapy Impulsive;WFL for tasks assessed/performed      Past Medical History:  Diagnosis Date  . Allergic rhinitis   . Allergy   . Arthritis   . At risk for falls   . Cancer ([place])    basal cell skin cancer  . Cataract   . Complication of anesthesia    Slow to awaken  . Constipation   . DDD (degenerative disc disease), cervical   . Dysphagia   . Edema extremities 01/13/2016  . GERD (gastroesophageal reflux disease)    Decdreased, has decreased coffee intack.  . Glaucoma   . HTN (hypertension)   . Hx of adenomatous colonic polyps 01/23/2016  . Hyperlipidemia   . Hypogonadism male   . Hypothyroidism   . OSA (obstructive sleep apnea)    Has had surgery x 2  . Parkinsonism ([place]) 01/16/2013   no tremors, balance issues only , speech and swallowing difficulties   . Pre-diabetes   . Sleep apnea    surgery x 2- no cpap  . Staph infection 10-2014  . Thyroid disease    HYPOTHYROID    Past Surgical History:  Procedure Laterality Date  . CATARACT EXTRACTION    . COLONOSCOPY  04-27-2004   tics, hems   . I&D EXTREMITY Right 10/18/2014   Procedure: IRRIGATION AND DEBRIDEMENT RIGHT KNEE PREPATELLA BURSA INFECTION;   Surgeon: [name], MD;  Location: [facility];  Service: Orthopedics;  Laterality: Right;  . [place] SURGERY  04-2015  . LUMBAR LAMINECTOMY WITH COFLEX 1 LEVEL N/A 06/06/2015   Procedure: Lumbar three-four Laminectomy/Foraminotomy with placement of Coflex;  Surgeon: [name], MD;  Location: [facility];  Service: Neurosurgery;  Laterality: N/A;  . NASAL SEPTUM SURGERY    . TONSILLECTOMY     and adenoids  . UVULOPALATOPHARYNGOPLASTY      There were no vitals filed for this visit.      Subjective Assessment - 01/09/17 1026    Subjective Had some dizziness with lying down exercises last visit.  Dizziness lasted about an hour or so beyond lying down.  Wife reports it doesn't happen at home.   Patient is accompained by: Family member  wife, [name]   Pertinent History Neuropathy, Parkinson's/PSP   Patient Stated Goals Pt's goal for therapy is to help with walking, getting up and down out of chair and bed   Currently in Pain? No/denies                         [facility] Adult PT Treatment/Exercise - 01/09/17 1035      Transfers   Transfers Sit to Stand;Stand to Sit   Sit to Stand 5: Supervision;4: Min guard  Stand to Sit 5: Supervision;4: Min guard   Number of Reps 1 set;10 reps  from mat surface   Transfer Cueing Requires several episodes of cues for increased forward lean and to slow descent into sitting   Comments Additional sit<>stand reps performed with short distance gait, with turns to sit.      Ambulation/Gait   Ambulation/Gait Yes   Ambulation/Gait Assistance 5: Supervision;4: Min guard   Ambulation/Gait Assistance Details Several bouts of short distance gait, 25-50 ft, including obstacles, furniture negotiation, figure-8 turns with walker, start/stops with 180 degree turns.  Pt experiences festinating episodes with turns; cues for marching, wide BOS weigthshifting to attempt to improve foot clearance.   Assistive device Rolling walker   Gait Pattern  Step-through pattern;Decreased step length - right;Decreased step length - left;Decreased dorsiflexion - right;Shuffle;Festinating;Trunk flexed;Narrow base of support   Ambulation Surface Level;Indoor   Gait Comments Provided cues to stop and reset posture, large amplitude marching in place, wide BOS weigthshifting, but pt has tendency to continue trying to move through festinating episode.     High Level Balance-Pre-gait activities   High Level Balance Comments At walker:  wide BOS lateral weightshifting 2 sets of 10 reps, marching in place 2 sets x 10 reps, forward step and weightshift 2 sets x 10 reps, then lateral step and weigthshifting 2 sets x 10 reps.  Cues for posture, for increased foot clearance/step height.     Exercises   Other Exercises  Seated ankle pumps x 10 reps, 2 sets     Knee/Hip Exercises: Seated   Long Arc Quad Strengthening;Right;Left;10 reps;1 set  green band   Heel Slides Strengthening;Right;Left;1 set;10 reps  green band   Marching Limitations 2 sets x 10 reps with 3 second hold     Cues for slowed pace of exercises           PT Education - 01/09/17 2050    Education provided Yes   Education Details Tips to reduce festination/freezing episodes with gait and turns   Person(s) Educated Patient;Spouse   Methods Explanation;Demonstration;Verbal cues;Handout   Comprehension Verbalized understanding;Returned demonstration;Verbal cues required;Need further instruction          PT Short Term Goals - 12/06/16 1954      PT SHORT TERM GOAL #1   Title Pt will perform HEP for strength, flexibility, balance and gait with wife's supervision.  TARGET 01/06/17   Time 4   Period Weeks   Status Achieved     PT SHORT TERM GOAL #2   Title Pt will perform 5x sit<>stand in less than or equal to 20 seconds (with minimal UE support and no posterior lean) for improved safety and efficiency with transfers.   Time 4   Period Weeks   Status New     PT SHORT TERM GOAL  #3   Title Pt will improve TUG score to less than or equal to 23 seconds for decreased fall risk.   Time 4   Period Weeks   Status New     PT SHORT TERM GOAL #4   Title Pt/wife will report at least 25% improvement in bed mobility, for improved safety/decreased caregiver burden with bed moiblity.   Time 4   Period Weeks   Status New           PT Long Term Goals - 12/06/16 1956      PT LONG TERM GOAL #1   Title Pt/wife will verbalize understanding of fall prevention, including tips to reduce freezing.  TARGET 02/04/17   Time 8   Period Weeks   Status New     PT LONG TERM GOAL #2   Title Pt will improve Berg Balance score to at least 25/56 for decreased fall risk.   Time 8   Period Weeks   Status New     PT LONG TERM GOAL #3   Title Pt will improve gait velocity to at least 2.3 ft/sec for improved efficiency and safety with gait.   Time 8   Period Weeks   Status New               Plan - 01/09/17 2051    Clinical Impression Statement No c/o from patient of dizziness or light headedness as in last session.  Session focused on lower extremity strengthening, transfers/short distance gait with turns and standing balance/pre-gait activities to help with reduction of festination episodes with turns and gait.  Pt verbalizes that he can't always think of these strategies when walking.  Even with verbal cues today, pt has difficulty slowing sequence to decrease festination episodes.  Pt will continue to benefit from skilled PT to address balance and gait, lower extremity strengthening.   Rehab Potential Fair   Clinical Impairments Affecting Rehab Potential decreased safety awareness    PT Frequency 2x / week   PT Duration 8 weeks  plus eval   PT Treatment/Interventions ADLs/Self Care Home Management;Functional mobility training;Gait training;Patient/family education;Orthotic Fit/Training;Neuromuscular re-education;Balance training;Therapeutic exercise;Therapeutic activities    PT Next Visit Plan Review tips to reduce freezing/festination with gait; continue to add strengthening and stretches as needed; work on transfer training and bed mobility; IMPULSIVE   Check STGS next week   Consulted and Agree with Plan of Care Patient;Family member/caregiver   Family Member [employer]      Patient will benefit from skilled therapeutic intervention in order to improve the following deficits and impairments:  Abnormal gait, Decreased balance, Decreased mobility, Decreased range of motion, Decreased safety awareness, Decreased strength, Difficulty walking, Impaired flexibility, Postural dysfunction  Visit Diagnosis: Other abnormalities of gait and mobility  Unsteadiness on feet  Muscle weakness (generalized)     Problem List Patient Active Problem List   Diagnosis Date Noted  . Peripheral neuropathy ([place]) 11/14/2016  . PSP (progressive supranuclear palsy) ([place]) 11/08/2016  . Hx of adenomatous colonic polyps 01/23/2016  . Edema extremities 01/13/2016  . Lumbar stenosis 06/06/2015  . HOCM (hypertrophic obstructive cardiomyopathy) ([place]) 12/12/2014  . Infection of right prepatellar bursa 10/18/2014  . DOE (dyspnea on exertion) 09/06/2014  . Fatigue due to excessive exertion 09/06/2014  . Testosterone deficiency 11/12/2013  . Routine general medical examination at a health care facility 08/30/2013  . Mixed hyperlipidemia 08/30/2013  . Other abnormal glucose 08/30/2013  . 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 Parkinsonism ([place]) 01/16/2013  . INSOMNIA 06/06/2008  . GLAUCOMA 06/01/2008  . Essential hypertension 06/01/2008  . Seasonal and perennial allergic rhinitis 06/01/2008    Apphia Cropley W. 01/09/2017, 8:57 PM  Frazier Butt., PT  [facility] [address] [place], [place], [address] Phone: [phone]   Fax:  [phone]  Name: [name] MRN: [id number] Date of Birth:  September 15, 1946

## 2017-01-09 NOTE — Patient Instructions (Signed)
Seated hold a ball between your hands , raise arms up to just above shoulder height without elevating shoulder or arching back, make sure you are sitting up tall with shoulders back, then  lower ball back to your lap. Keep elbows straight.   Perform 2 sets of 10 reps 1x day  Seated hold a ball in between both hands, straighten elbows, then bend elbows and bring ball to your chest, sit up tall with shoulders back     Perform 2 sets of 10 reps 1x day

## 2017-01-09 NOTE — Therapy (Signed)
[facility] [address] [place], [place], [address] Phone: [phone]   Fax:  [phone]  Occupational Therapy Treatment  Patient Details  Name: [name] MRN: [id number] Date of Birth: 1946/07/15 Referring Provider: Dr. Star Age  Encounter Date: 01/09/2017      OT End of Session - 01/09/17 1159    Visit Number 4   Number of Visits 17   Date for OT Re-Evaluation 02/04/17   Authorization Type Medicare / BCBS, G-code needed   Authorization Time Period pt currently scheduled through 01/24/17   Authorization - Visit Number 4   Authorization - Number of Visits 10   Activity Tolerance Patient tolerated treatment well   Behavior During Therapy Impulsive      Past Medical History:  Diagnosis Date  . Allergic rhinitis   . Allergy   . Arthritis   . At risk for falls   . Cancer ([place])    basal cell skin cancer  . Cataract   . Complication of anesthesia    Slow to awaken  . Constipation   . DDD (degenerative disc disease), cervical   . Dysphagia   . Edema extremities 01/13/2016  . GERD (gastroesophageal reflux disease)    Decdreased, has decreased coffee intack.  . Glaucoma   . HTN (hypertension)   . Hx of adenomatous colonic polyps 01/23/2016  . Hyperlipidemia   . Hypogonadism male   . Hypothyroidism   . OSA (obstructive sleep apnea)    Has had surgery x 2  . Parkinsonism ([place]) 01/16/2013   no tremors, balance issues only , speech and swallowing difficulties   . Pre-diabetes   . Sleep apnea    surgery x 2- no cpap  . Staph infection 10-2014  . Thyroid disease    HYPOTHYROID    Past Surgical History:  Procedure Laterality Date  . CATARACT EXTRACTION    . COLONOSCOPY  04-27-2004   tics, hems   . I&D EXTREMITY Right 10/18/2014   Procedure: IRRIGATION AND DEBRIDEMENT RIGHT KNEE PREPATELLA BURSA INFECTION;  Surgeon: [name], MD;  Location: [facility];  Service: Orthopedics;  Laterality: Right;  .  [place] SURGERY  04-2015  . LUMBAR LAMINECTOMY WITH COFLEX 1 LEVEL N/A 06/06/2015   Procedure: Lumbar three-four Laminectomy/Foraminotomy with placement of Coflex;  Surgeon: [name], MD;  Location: [facility];  Service: Neurosurgery;  Laterality: N/A;  . NASAL SEPTUM SURGERY    . TONSILLECTOMY     and adenoids  . UVULOPALATOPHARYNGOPLASTY      There were no vitals filed for this visit.      Subjective Assessment - 01/09/17 1107    Subjective  Pt reports right shoulder pain   Pertinent History Progressive Supranuclear Palsy (PSP), hx of falls, cognitive deficits, glaucoma, lumbar stenosis, peripheral neuropathy, **AVOID SUPINE SHOULDER EXERCISES AS PT REPORTS DIZZINESS**   Limitations fall risk, cognitive deficits, impulsivity   Patient Stated Goals brushing hair, putting on deodorant, pulling on shirt, writing numbers   Currently in Pain? No/denies             Treatment: Seated edge of mat simulated ADLs with bag exercises,  Simulated donning shirt, drying back and pulling up socks, min-mod v.c. Pt practiced donning/ doffing jacket in seated x 3 reps with improved performance following repetition( he does best dressing right arm first, then pulling jacket behind his neck with left hand) Sitting edgee of mat with arms in external rotation with pt performing gentle scapular retraction as  tolerated.                 OT Education - 01/09/17 1200    Education provided Yes   Education Details ball exercises closed chain in seated   Person(s) Educated Patient;Spouse   Methods Explanation;Demonstration;Verbal cues;Handout   Comprehension Verbalized understanding;Returned demonstration;Verbal cues required;Tactile cues required          OT Short Term Goals - 12/06/16 1311      OT SHORT TERM GOAL #1   Title Pt will perform updated HEP with supervision/min cues.--check STGs 01/04/17   Time 4   Period Weeks   Status New     OT SHORT TERM GOAL #2   Title Pt  will wash/dry hands adequately with min cueing.   Time 4   Period Weeks   Status New     OT SHORT TERM GOAL #3   Title Pt will report incr ease with washing hair.   Baseline -----   Period Weeks   Status New     OT SHORT TERM GOAL #4   Title Pt will be able to put deodorant lid on/off and apply with min v.c. for movement amplitude.   Baseline -----   Time 4   Period Weeks   Status New     OT SHORT TERM GOAL #5   Title ----------   Baseline -------   Time --   Period --   Status --           OT Long Term Goals - 01/07/17 1410      OT LONG TERM GOAL #1   Title Pt/caregiver will verbalize understanding of adaptive strategies/AE for ADLs prn.--check LTGs 02/04/17   Time 8   Period Weeks   Status New     OT LONG TERM GOAL #2   Title Pt will demonstrate improved RUE functional use as evidenced by increasing box/ blocks score to 40 blocks   Baseline 35   Period Weeks   Status New     OT LONG TERM GOAL #3   Title Pt will write phone number once given verbally with at least 90% legibility.   Time 8   Period Weeks   Status On-going  01/07/17  performed at approx this level, will monitor for consistency     OT LONG TERM GOAL #4   Title Pt will be able to brush his hair with min cues for movement amplitude.   Time 8   Period Weeks   Status New     OT LONG TERM GOAL #5   Title Pt will report improved shoulder pain as shown by ability to don/doff jacket mod I.   Baseline unable to doff due to R shoulder pain   Time 8   Period Weeks   Status New               Plan - 01/09/17 1157    Clinical Impression Statement Pt is progressing towards goals. He is limited by right soulder pain and cognitive deficits   Rehab Potential Good   Clinical Impairments Affecting Rehab Potential cognitive deficits, impulsivity   OT Frequency 2x / week   OT Duration 8 weeks   OT Treatment/Interventions Self-care/ADL training;Cryotherapy;Energy conservation;Neuromuscular  education;Functional Mobility Training;Manual Therapy;Passive range of motion;Cognitive remediation/compensation;Therapeutic activities;Visual/perceptual remediation/compensation;Therapeutic exercises;Therapeutic exercise;Patient/family education;DME and/or AE instruction;Moist Heat;Balance training   Plan adaptive strategies for ADLs, check to see how ball ex are going   OT Home Exercise Plan Education provided:  12/25/16 shoulder flex  with ball in supine, PWR! up in supine--d/c 01/07/17   Consulted and Agree with Plan of Care Patient;Family member/caregiver   Family Member Consulted wife--[name]      Patient will benefit from skilled therapeutic intervention in order to improve the following deficits and impairments:  Decreased coordination, Decreased range of motion, Decreased safety awareness, Decreased knowledge of precautions, Impaired tone, Impaired UE functional use, Pain, Impaired perceived functional ability, Decreased mobility, Decreased cognition, Decreased balance, Decreased knowledge of use of DME  Visit Diagnosis: Other symptoms and signs involving the nervous system  Other symptoms and signs involving the musculoskeletal system  Other lack of coordination  Other abnormalities of gait and mobility  Frontal lobe and executive function deficit  Right shoulder pain, unspecified chronicity  Stiffness of right shoulder, not elsewhere classified  Muscle weakness (generalized)    Problem List Patient Active Problem List   Diagnosis Date Noted  . Peripheral neuropathy ([place]) 11/14/2016  . PSP (progressive supranuclear palsy) ([place]) 11/08/2016  . Hx of adenomatous colonic polyps 01/23/2016  . Edema extremities 01/13/2016  . Lumbar stenosis 06/06/2015  . HOCM (hypertrophic obstructive cardiomyopathy) ([place]) 12/12/2014  . Infection of right prepatellar bursa 10/18/2014  . DOE (dyspnea on exertion) 09/06/2014  . Fatigue due to excessive exertion 09/06/2014  . Testosterone  deficiency 11/12/2013  . Routine general medical examination at a health care facility 08/30/2013  . Mixed hyperlipidemia 08/30/2013  . Other abnormal glucose 08/30/2013  . 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 Parkinsonism ([place]) 01/16/2013  . INSOMNIA 06/06/2008  . GLAUCOMA 06/01/2008  . Essential hypertension 06/01/2008  . Seasonal and perennial allergic rhinitis 06/01/2008    Shekina Cordell 01/09/2017, 12:01 PM  [facility] [address] [place], [place], [address] Phone: [phone]   Fax:  [phone]  Name: [name] MRN: [id number] Date of Birth: 1946/01/08

## 2017-01-09 NOTE — Patient Instructions (Signed)
Tips to reduce festinating/freezing episodes with standing or walking:  1. Stand tall with your feet wide, so that you can rock and weight shift through your hips. 2. Don't try to fight the freeze: if you begin taking slower, faster, smaller steps, STOP, get your posture tall, and RESET your posture and balance.  Take a deep breath before taking the BIG step to start again. 3. March in place, with high knee stepping, to get started walking again.  You can also step forward or step sideways, making sure to take a big step to begin your movement.     -------------------------------------------------------------------------------------------------------------------- 4. Use auditory cues:  Count out loud, think of a familiar tune or song or cadence, use pocket metronome, to use rhythm to get started walking again. 5. Use visual cues:  Use a line to step over, use laser pointer line to step over, (using BIG steps) to start walking again. 6. Use visual targets to keep your posture tall (look ahead and focus on an object or target at eye level). 7. As you approach where your destination with walking, count your steps out loud and/or focus on your target with your eyes until you are fully there. 8. Use appropriate assistive device, as advised by your physical therapist to assist with taking longer, consistent steps.

## 2017-01-11 NOTE — Patient Instructions (Signed)
Put a sticky note on thermos cup used by pt when he's out for reminders, and place reminder note for pt at his place at the table for reminders.

## 2017-01-11 NOTE — Therapy (Signed)
[facility] [address] [place], [place], [address] Phone: [phone]   Fax:  [phone]  Speech Language Pathology Treatment  Patient Details  Name: [name] MRN: [id number] Date of Birth: Oct 11, 1945 Referring Provider: Alonza Bogus, DO  Encounter Date: 01/09/2017      End of Session - 01/11/17 0842    Visit Number 3   Number of Visits 9   Date for SLP Re-Evaluation 02/01/17   SLP Start Time 1150   SLP Stop Time  1230   SLP Time Calculation (min) 40 min   Activity Tolerance Patient tolerated treatment well      Past Medical History:  Diagnosis Date  . Allergic rhinitis   . Allergy   . Arthritis   . At risk for falls   . Cancer ([place])    basal cell skin cancer  . Cataract   . Complication of anesthesia    Slow to awaken  . Constipation   . DDD (degenerative disc disease), cervical   . Dysphagia   . Edema extremities 01/13/2016  . GERD (gastroesophageal reflux disease)    Decdreased, has decreased coffee intack.  . Glaucoma   . HTN (hypertension)   . Hx of adenomatous colonic polyps 01/23/2016  . Hyperlipidemia   . Hypogonadism male   . Hypothyroidism   . OSA (obstructive sleep apnea)    Has had surgery x 2  . Parkinsonism ([place]) 01/16/2013   no tremors, balance issues only , speech and swallowing difficulties   . Pre-diabetes   . Sleep apnea    surgery x 2- no cpap  . Staph infection 10-2014  . Thyroid disease    HYPOTHYROID    Past Surgical History:  Procedure Laterality Date  . CATARACT EXTRACTION    . COLONOSCOPY  04-27-2004   tics, hems   . I&D EXTREMITY Right 10/18/2014   Procedure: IRRIGATION AND DEBRIDEMENT RIGHT KNEE PREPATELLA BURSA INFECTION;  Surgeon: [name], MD;  Location: [facility];  Service: Orthopedics;  Laterality: Right;  . [place] SURGERY  04-2015  . LUMBAR LAMINECTOMY WITH COFLEX 1 LEVEL N/A 06/06/2015   Procedure: Lumbar three-four Laminectomy/Foraminotomy with  placement of Coflex;  Surgeon: [name], MD;  Location: [facility];  Service: Neurosurgery;  Laterality: N/A;  . NASAL SEPTUM SURGERY    . TONSILLECTOMY     and adenoids  . UVULOPALATOPHARYNGOPLASTY      There were no vitals filed for this visit.             ADULT SLP TREATMENT - 01/11/17 0001      General Information   Behavior/Cognition Cooperative;Pleasant mood;Impulsive;Decreased sustained attention     Treatment Provided   Treatment provided Dysphagia     Dysphagia Treatment   Respiratory Status Room air   Oral Cavity - Dentition Adequate natural dentition   Treatment Methods Skilled observation;Patient/caregiver education   Patient observed directly with PO's Yes   Type of PO's observed Honey-thick liquids   Liquids provided via Straw   Oral Phase Signs & Symptoms Oral holding   Pharyngeal Phase Signs & Symptoms Wet vocal quality   Other treatment/comments Pt took 5 drinks of water from thermos cup and coughed after 1, immediately. SLP engaged in pt and family education re: compensatory nmeasures for external cues for pt. WLP also educated wife that much of pt's "stubbornness" at this time may be side effects of the disease. Pt confirmed that he often forgets to take a small sip  until he is coughing on a larger sip. SLP provided pt/wife a handout of thickener types/brands and places to purchase, given "S" today. Pt req'd min A occasionally for his dysphagia HEP     Assessment / Recommendations / Plan   Plan Continue with current plan of care     Progression Toward Goals   Progression toward goals Progressing toward goals          SLP Education - 01/11/17 0841    Education provided Yes   Education Details copmensatory strategies for swallowing, thickener options   Person(s) Educated Patient;Spouse   Methods Explanation;Demonstration;Verbal cues;Handout   Comprehension Verbalized understanding          SLP Short Term Goals - 12/06/16 1733      SLP  SHORT TERM GOAL #2   Title --          SLP Long Term Goals - 01/11/17 0846      SLP LONG TERM GOAL #1   Title pt will perform HEP with usual mod A over 2 sessions   Baseline 01-09-17   Time 4   Period Weeks  or visits   Status Revised     SLP LONG TERM GOAL #2   Title pt or wife will demo understanding of 4 s/s aspiration PNA   Time 4   Period Weeks  or visits   Status On-going     SLP LONG TERM GOAL #3   Title to benefit pt's pulmonary health, wife will demo appropriate cueing for pt during POs over two sessions   Time 4   Period Weeks  or visits   Status On-going     SLP LONG TERM GOAL #4   Title pt will produce a set of 10 phrases/sentences with 80% intelligibility over two sessions   Time 4   Period Weeks  or visits   Status On-going          Plan - 01/11/17 0844    Clinical Impression Statement Pt and wife were provided education re: thickeners and other issues surrounding pt's dysphagia. Skilled ST rmains necessary for safety with POs/mitigate aspiration risk, and to attempt to improve pt's ability to communicate with wife.   Speech Therapy Frequency 1x /week   Duration 4 weeks   Treatment/Interventions Aspiration precaution training;Diet toleration management by SLP;Cueing hierarchy;Patient/family education;SLP instruction and feedback;Internal/external aids;Compensatory techniques;Environmental controls;Pharyngeal strengthening exercises  any or all may be used; pharyngeal strengthening only if pt decides to accept help with HEP    Potential to Achieve Goals Fair   Potential Considerations Previous level of function;Ability to learn/carryover information;Severity of impairments;Cooperation/participation level   Consulted and Agree with Plan of Care Patient;Family member/caregiver   Family Member Consulted wife      Patient will benefit from skilled therapeutic intervention in order to improve the following deficits and impairments:   Oropharyngeal  dysphagia  Dysarthria and anarthria  Cognitive communication deficit  Aphasia    Problem List Patient Active Problem List   Diagnosis Date Noted  . Peripheral neuropathy ([place]) 11/14/2016  . PSP (progressive supranuclear palsy) ([place]) 11/08/2016  . Hx of adenomatous colonic polyps 01/23/2016  . Edema extremities 01/13/2016  . Lumbar stenosis 06/06/2015  . HOCM (hypertrophic obstructive cardiomyopathy) ([place]) 12/12/2014  . Infection of right prepatellar bursa 10/18/2014  . DOE (dyspnea on exertion) 09/06/2014  . Fatigue due to excessive exertion 09/06/2014  . Testosterone deficiency 11/12/2013  . Routine general medical examination at a health care facility 08/30/2013  . Mixed hyperlipidemia  08/30/2013  . Other abnormal glucose 08/30/2013  . 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 Parkinsonism ([place]) 01/16/2013  . INSOMNIA 06/06/2008  . GLAUCOMA 06/01/2008  . Essential hypertension 06/01/2008  . Seasonal and perennial allergic rhinitis 06/01/2008    [facility] ,MS, CCC-SLP  01/11/2017, 8:47 AM  [facility] [address] [place], [place], [address] Phone: [phone]   Fax:  [phone]   Name: [name] MRN: [id number] Date of Birth: [birth year]

## 2017-01-14 ENCOUNTER — Ambulatory Visit: Payer: Medicare Other

## 2017-01-14 ENCOUNTER — Ambulatory Visit: Payer: Medicare Other | Admitting: Physical Therapy

## 2017-01-14 ENCOUNTER — Ambulatory Visit: Payer: Medicare Other | Admitting: Occupational Therapy

## 2017-01-14 ENCOUNTER — Encounter: Payer: Self-pay | Admitting: Physical Therapy

## 2017-01-14 DIAGNOSIS — M6281 Muscle weakness (generalized): Secondary | ICD-10-CM

## 2017-01-14 DIAGNOSIS — R41844 Frontal lobe and executive function deficit: Secondary | ICD-10-CM

## 2017-01-14 DIAGNOSIS — R471 Dysarthria and anarthria: Secondary | ICD-10-CM

## 2017-01-14 DIAGNOSIS — R2681 Unsteadiness on feet: Secondary | ICD-10-CM | POA: Diagnosis not present

## 2017-01-14 DIAGNOSIS — R278 Other lack of coordination: Secondary | ICD-10-CM

## 2017-01-14 DIAGNOSIS — R29818 Other symptoms and signs involving the nervous system: Secondary | ICD-10-CM | POA: Diagnosis not present

## 2017-01-14 DIAGNOSIS — R2689 Other abnormalities of gait and mobility: Secondary | ICD-10-CM | POA: Diagnosis not present

## 2017-01-14 DIAGNOSIS — R29898 Other symptoms and signs involving the musculoskeletal system: Secondary | ICD-10-CM

## 2017-01-14 DIAGNOSIS — R4701 Aphasia: Secondary | ICD-10-CM

## 2017-01-14 DIAGNOSIS — R41841 Cognitive communication deficit: Secondary | ICD-10-CM

## 2017-01-14 DIAGNOSIS — R1312 Dysphagia, oropharyngeal phase: Secondary | ICD-10-CM

## 2017-01-14 DIAGNOSIS — M25611 Stiffness of right shoulder, not elsewhere classified: Secondary | ICD-10-CM

## 2017-01-14 DIAGNOSIS — M25511 Pain in right shoulder: Secondary | ICD-10-CM

## 2017-01-14 NOTE — Therapy (Signed)
[facility] [address] [place], [place], [address] Phone: [phone]   Fax:  [phone]  Physical Therapy Treatment  Patient Details  Name: [name] MRN: [id number] Date of Birth: 1946-03-15 Referring Provider: Rexene Alberts  Encounter Date: 01/14/2017      PT End of Session - 01/14/17 2044    Visit Number 6   Number of Visits 17   Date for PT Re-Evaluation 02/04/17   Authorization Type Medicare Primary, BCBS second-GCODE every 10th visit   PT Start Time 1315   PT Stop Time 1358   PT Time Calculation (min) 43 min   Equipment Utilized During Treatment Gait belt   Activity Tolerance Patient tolerated treatment well   Behavior During Therapy Impulsive;WFL for tasks assessed/performed      Past Medical History:  Diagnosis Date  . Allergic rhinitis   . Allergy   . Arthritis   . At risk for falls   . Cancer ([place])    basal cell skin cancer  . Cataract   . Complication of anesthesia    Slow to awaken  . Constipation   . DDD (degenerative disc disease), cervical   . Dysphagia   . Edema extremities 01/13/2016  . GERD (gastroesophageal reflux disease)    Decdreased, has decreased coffee intack.  . Glaucoma   . HTN (hypertension)   . Hx of adenomatous colonic polyps 01/23/2016  . Hyperlipidemia   . Hypogonadism male   . Hypothyroidism   . OSA (obstructive sleep apnea)    Has had surgery x 2  . Parkinsonism ([place]) 01/16/2013   no tremors, balance issues only , speech and swallowing difficulties   . Pre-diabetes   . Sleep apnea    surgery x 2- no cpap  . Staph infection 10-2014  . Thyroid disease    HYPOTHYROID    Past Surgical History:  Procedure Laterality Date  . CATARACT EXTRACTION    . COLONOSCOPY  04-27-2004   tics, hems   . I&D EXTREMITY Right 10/18/2014   Procedure: IRRIGATION AND DEBRIDEMENT RIGHT KNEE PREPATELLA BURSA INFECTION;  Surgeon: [name], MD;  Location: [facility];  Service: Orthopedics;   Laterality: Right;  . [place] SURGERY  04-2015  . LUMBAR LAMINECTOMY WITH COFLEX 1 LEVEL N/A 06/06/2015   Procedure: Lumbar three-four Laminectomy/Foraminotomy with placement of Coflex;  Surgeon: [name], MD;  Location: [facility];  Service: Neurosurgery;  Laterality: N/A;  . NASAL SEPTUM SURGERY    . TONSILLECTOMY     and adenoids  . UVULOPALATOPHARYNGOPLASTY      There were no vitals filed for this visit.      Subjective Assessment - 01/14/17 1315    Subjective Does not want to do anything lying down today to avoid dizziness he's experienced last two visits.   Patient is accompained by: Family member  wife, [name]   Pertinent History Neuropathy, Parkinson's/PSP   Patient Stated Goals Pt's goal for therapy is to help with walking, getting up and down out of chair and bed                         [facility] Adult PT Treatment/Exercise - 01/14/17 0001      Transfers   Transfers Sit to Stand;Stand to Sit   Sit to Stand 5: Supervision;4: Min guard   Sit to Stand Details (indicate cue type and reason) Pt can state he should always scoot forward first, however >50% of time he  needs cues to complete   Stand to Sit 5: Supervision;4: Min guard   Transfer Cueing Requires several episodes of cues for increased forward lean and to slow descent into sitting   Comments Additional sit<>stand reps performed with short distance gait, with turns to sit to practice approaching a surface with widest arc and to practice what to do when his gait freezes     Ambulation/Gait   Ambulation/Gait Assistance 5: Supervision;4: Min guard   Ambulation/Gait Assistance Details Several bouts of short distance gait, 25-50 ft, including obstacles, furniture negotiation, figure-8 turns with walker, start/stops with 180 degree turns.  Pt experiences festinating episodes with turns; cues for marching, wide BOS weigthshifting to attempt to improve foot clearance. Despite ability to state what he should  do when he freezes, he required cues and assist 100% of the time when freezing occurred   Ambulation Distance (Feet) 600 Feet  total; multiple shorter walks   Assistive device Rolling walker   Gait Pattern Step-through pattern;Festinating;Trunk flexed;Narrow base of support   Ambulation Surface Level;Indoor     Posture/Postural Control   Posture/Postural Control Postural limitations   Postural Limitations Forward head;Rounded Shoulders;Posterior pelvic tilt   Posture Comments with vc only pt able to correct forward head and rounded shoulders to proper alignment (sit and stand); rehearsed numerous times throughout session     Knee/Hip Exercises: Standing   Hip Abduction Stengthening;Both;1 set;10 reps   Hip Extension Stengthening;Both;1 set;10 reps   SLS bil x 5 with less and less UE support until no UE support   Other Standing Knee Exercises wt shift fwd/backward  in staggered stance x 20                PT Education - 01/14/17 1954    Education provided Yes   Education Details Repeated education on posture; correct use of RW/upright posture; sequencing for transfers   Person(s) Educated Patient;Spouse   Methods Explanation;Demonstration;Verbal cues   Comprehension Verbalized understanding;Returned demonstration;Verbal cues required          PT Short Term Goals - 12/06/16 1954      PT SHORT TERM GOAL #1   Title Pt will perform HEP for strength, flexibility, balance and gait with wife's supervision.  TARGET 01/06/17   Time 4   Period Weeks   Status Achieved     PT SHORT TERM GOAL #2   Title Pt will perform 5x sit<>stand in less than or equal to 20 seconds (with minimal UE support and no posterior lean) for improved safety and efficiency with transfers.   Time 4   Period Weeks   Status New     PT SHORT TERM GOAL #3   Title Pt will improve TUG score to less than or equal to 23 seconds for decreased fall risk.   Time 4   Period Weeks   Status New     PT SHORT TERM  GOAL #4   Title Pt/wife will report at least 25% improvement in bed mobility, for improved safety/decreased caregiver burden with bed moiblity.   Time 4   Period Weeks   Status New           PT Long Term Goals - 12/06/16 1956      PT LONG TERM GOAL #1   Title Pt/wife will verbalize understanding of fall prevention, including tips to reduce freezing.  TARGET 02/04/17   Time 8   Period Weeks   Status New     PT LONG TERM GOAL #2  Title Pt will improve Berg Balance score to at least 25/56 for decreased fall risk.   Time 8   Period Weeks   Status New     PT LONG TERM GOAL #3   Title Pt will improve gait velocity to at least 2.3 ft/sec for improved efficiency and safety with gait.   Time 8   Period Weeks   Status New               Plan - 01/14/17 1958    Clinical Impression Statement Session focused on transfer training, gait training,balance and strengthening. Patient less impulsive and able to consistently follow therapist's instructions or cues, however demonstrated limited carryover with need for frequent cues. Anticipate will need consistent repetitions/instructions to help with what carryover he is cognitively capable of.    Rehab Potential Fair   Clinical Impairments Affecting Rehab Potential decreased safety awareness    PT Frequency 2x / week   PT Duration 8 weeks  plus eval   PT Treatment/Interventions ADLs/Self Care Home Management;Functional mobility training;Gait training;Patient/family education;Orthotic Fit/Training;Neuromuscular re-education;Balance training;Therapeutic exercise;Therapeutic activities   PT Next Visit Plan SHOOT, appears he was due for STG check 4/1 and I missed that!! Sorry!; add strengthening and stretches as needed; Review tips to reduce freezing/festination with gait; work on transfer training and bed mobility; IMPULSIVE   Check STGS next week   Consulted and Agree with Plan of Care Patient;Family member/caregiver   Family Member  [employer]      Patient will benefit from skilled therapeutic intervention in order to improve the following deficits and impairments:  Abnormal gait, Decreased balance, Decreased mobility, Decreased range of motion, Decreased safety awareness, Decreased strength, Difficulty walking, Impaired flexibility, Postural dysfunction  Visit Diagnosis: Muscle weakness (generalized)  Unsteadiness on feet  Festinating gait  Decreased functional mobility     Problem List Patient Active Problem List   Diagnosis Date Noted  . Peripheral neuropathy ([place]) 11/14/2016  . PSP (progressive supranuclear palsy) ([place]) 11/08/2016  . Hx of adenomatous colonic polyps 01/23/2016  . Edema extremities 01/13/2016  . Lumbar stenosis 06/06/2015  . HOCM (hypertrophic obstructive cardiomyopathy) ([place]) 12/12/2014  . Infection of right prepatellar bursa 10/18/2014  . DOE (dyspnea on exertion) 09/06/2014  . Fatigue due to excessive exertion 09/06/2014  . Testosterone deficiency 11/12/2013  . Routine general medical examination at a health care facility 08/30/2013  . Mixed hyperlipidemia 08/30/2013  . Other abnormal glucose 08/30/2013  . 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 Parkinsonism ([place]) 01/16/2013  . INSOMNIA 06/06/2008  . GLAUCOMA 06/01/2008  . Essential hypertension 06/01/2008  . Seasonal and perennial allergic rhinitis 06/01/2008    Rexanne Mano, PT 01/14/2017, 8:46 PM  [facility] [address] [place], [place], [address] Phone: [phone]   Fax:  [phone]  Name: [name] MRN: [id number] Date of Birth: Dec 12, 1945

## 2017-01-14 NOTE — Patient Instructions (Signed)
Do your swallow exercises TWICE each day to give yourself best opportunity to improve your swallow skills and drink more regular liquids.

## 2017-01-14 NOTE — Therapy (Signed)
[facility] [address] [place], [place], [address] Phone: [phone]   Fax:  [phone]  Speech Language Pathology Treatment  Patient Details  Name: [name] MRN: [id number] Date of Birth: 10-05-1946 Referring Provider: Alonza Bogus, DO  Encounter Date: 01/14/2017      End of Session - 01/14/17 1623    Visit Number 4   Number of Visits 9   Date for SLP Re-Evaluation 02/01/17   SLP Start Time 1448   SLP Stop Time  1528   SLP Time Calculation (min) 40 min   Activity Tolerance Patient tolerated treatment well      Past Medical History:  Diagnosis Date  . Allergic rhinitis   . Allergy   . Arthritis   . At risk for falls   . Cancer ([place])    basal cell skin cancer  . Cataract   . Complication of anesthesia    Slow to awaken  . Constipation   . DDD (degenerative disc disease), cervical   . Dysphagia   . Edema extremities 01/13/2016  . GERD (gastroesophageal reflux disease)    Decdreased, has decreased coffee intack.  . Glaucoma   . HTN (hypertension)   . Hx of adenomatous colonic polyps 01/23/2016  . Hyperlipidemia   . Hypogonadism male   . Hypothyroidism   . OSA (obstructive sleep apnea)    Has had surgery x 2  . Parkinsonism ([place]) 01/16/2013   no tremors, balance issues only , speech and swallowing difficulties   . Pre-diabetes   . Sleep apnea    surgery x 2- no cpap  . Staph infection 10-2014  . Thyroid disease    HYPOTHYROID    Past Surgical History:  Procedure Laterality Date  . CATARACT EXTRACTION    . COLONOSCOPY  04-27-2004   tics, hems   . I&D EXTREMITY Right 10/18/2014   Procedure: IRRIGATION AND DEBRIDEMENT RIGHT KNEE PREPATELLA BURSA INFECTION;  Surgeon: [name], MD;  Location: [facility];  Service: Orthopedics;  Laterality: Right;  . [place] SURGERY  04-2015  . LUMBAR LAMINECTOMY WITH COFLEX 1 LEVEL N/A 06/06/2015   Procedure: Lumbar three-four Laminectomy/Foraminotomy with  placement of Coflex;  Surgeon: [name], MD;  Location: [facility];  Service: Neurosurgery;  Laterality: N/A;  . NASAL SEPTUM SURGERY    . TONSILLECTOMY     and adenoids  . UVULOPALATOPHARYNGOPLASTY      There were no vitals filed for this visit.      Subjective Assessment - 01/14/17 1451    Subjective (P)  Different thickener has worked better for pt.   Patient is accompained by: (P)  Family member  wife               ADULT SLP TREATMENT - 01/14/17 1605      General Information   Behavior/Cognition Cooperative;Pleasant mood;Impulsive;Decreased sustained attention     Treatment Provided   Treatment provided Dysphagia;Cognitive-Linquistic     Dysphagia Treatment   Temperature Spikes Noted No   Patient observed directly with PO's Yes   Type of PO's observed Honey-thick liquids   Oral Phase Signs & Symptoms Oral holding   Pharyngeal Phase Signs & Symptoms Immediate cough;Wet vocal quality;Suspected delayed swallow initiation   Type of cueing Verbal;Visual   Amount of cueing Minimal   Other treatment/comments SLP and wife used verbal and visual cues for pt's second swallow 1/7 sips today, initially, with reminder put on pt's travel cup "Sip! 2 swallows". Pt with  3 coughing episodes immediately following cup sip via straw: took larger sip than recomended x1, and did not swallow twice x2. Pt complained of V-8 juice "too thick" but wife assured pt/SLP that it was honey thick. Wife reminded pt chocolate milk was just as thick and pt did not say it was too thick. SLP suggested thta chocolate milk thickened was likely consistency of a shake. Pt inquired about ice cream and ice cream sandwiches, after telling SLP he coughs regularly with ice cream. SLP suggested pt not eat ice cream/ice cream sandwiches due to that and explained  mixed consistencies to pt/wife. Pt told SLP of his swallowing exercises and SLP told pt to complete BID, as pt stated "maybe once a day" for frequency of  HEP.     Pain Assessment   Pain Assessment No/denies pain     Cognitive-Linquistic Treatment   Treatment focused on Dysarthria   Skilled Treatment Pt with consistent cues to slow rate of speech with SLP actively slowing his rate of speech. Pt's speech consistently hypophonic - SLP cues did not improve pt's loudness. Intelligibility was improved by cues to reduce rate however pt had much difficulty increasing volume AND reducing rate.      Assessment / Recommendations / Plan   Plan Continue with current plan of care     Progression Toward Goals   Progression toward goals Progressing toward goals          SLP Education - 01/14/17 1623    Education provided Yes   Education Details see "skilled intervention" for details   Person(s) Educated Patient;Spouse   Comprehension Verbalized understanding;Need further instruction  pt requires further instruction          SLP Short Term Goals - 12/06/16 1733      SLP SHORT TERM GOAL #2   Title --          SLP Long Term Goals - 01/14/17 1624      SLP LONG TERM GOAL #1   Title pt will perform HEP with usual mod A over 2 sessions   Baseline 01-09-17   Time 3   Period Weeks  or visits   Status Revised     SLP LONG TERM GOAL #2   Title pt or wife will demo understanding of 4 s/s aspiration PNA   Time 3   Period Weeks  or visits   Status On-going     SLP LONG TERM GOAL #3   Title to benefit pt's pulmonary health, wife will demo appropriate cueing for pt during POs over two sessions   Baseline 01-14-17   Time 3   Period Weeks  or visits   Status On-going     SLP LONG TERM GOAL #4   Title pt will produce a set of 10 phrases/sentences with 80% intelligibility over two sessions   Time 3   Period Weeks  or visits   Status On-going          Plan - 01/14/17 1624    Clinical Impression Statement Pt and wife were provided education re: pt's dysarthria and pt's dysphagia. Skilled ST rmains necessary for safety with  POs/mitigate aspiration risk, and to attempt to improve pt's ability to communicate with wife.   Speech Therapy Frequency 1x /week   Duration 4 weeks   Treatment/Interventions Aspiration precaution training;Diet toleration management by SLP;Cueing hierarchy;Patient/family education;SLP instruction and feedback;Internal/external aids;Compensatory techniques;Environmental controls;Pharyngeal strengthening exercises   Potential to Achieve Goals Fair   Potential Considerations Previous level of  function;Ability to learn/carryover information;Severity of impairments;Cooperation/participation level   Consulted and Agree with Plan of Care Patient;Family member/caregiver   Family Member Consulted wife      Patient will benefit from skilled therapeutic intervention in order to improve the following deficits and impairments:   Oropharyngeal dysphagia  Dysarthria and anarthria  Cognitive communication deficit  Aphasia    Problem List Patient Active Problem List   Diagnosis Date Noted  . Peripheral neuropathy ([place]) 11/14/2016  . PSP (progressive supranuclear palsy) ([place]) 11/08/2016  . Hx of adenomatous colonic polyps 01/23/2016  . Edema extremities 01/13/2016  . Lumbar stenosis 06/06/2015  . HOCM (hypertrophic obstructive cardiomyopathy) ([place]) 12/12/2014  . Infection of right prepatellar bursa 10/18/2014  . DOE (dyspnea on exertion) 09/06/2014  . Fatigue due to excessive exertion 09/06/2014  . Testosterone deficiency 11/12/2013  . Routine general medical examination at a health care facility 08/30/2013  . Mixed hyperlipidemia 08/30/2013  . Other abnormal glucose 08/30/2013  . 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 Parkinsonism ([place]) 01/16/2013  . INSOMNIA 06/06/2008  . GLAUCOMA 06/01/2008  . Essential hypertension 06/01/2008  . Seasonal and perennial allergic rhinitis 06/01/2008    [facility] ,MS, CCC-SLP  01/14/2017, 4:27 PM  [facility] [address] [place], [place], [address] Phone: [phone]   Fax:  [phone]   Name: [name] MRN: [id number] Date of Birth: 23-Jul-1946

## 2017-01-14 NOTE — Therapy (Addendum)
[facility] [address] [place], [place], [address] Phone: [phone]   Fax:  [phone]  Occupational Therapy Treatment  Patient Details  Name: [name] MRN: [id number] Date of Birth: 08-27-1946 Referring Provider: Dr. Star Age  Encounter Date: 01/14/2017      OT End of Session - 01/14/17 1444    Visit Number 5   Number of Visits 17   Date for OT Re-Evaluation 02/04/17   Authorization Type Medicare / BCBS, G-code needed   Authorization Time Period pt currently scheduled through 01/24/17   Authorization - Visit Number 5   Authorization - Number of Visits 10   OT Start Time 8889   OT Stop Time 1445   OT Time Calculation (min) 40 min   Activity Tolerance Patient tolerated treatment well   Behavior During Therapy Impulsive      Past Medical History:  Diagnosis Date  . Allergic rhinitis   . Allergy   . Arthritis   . At risk for falls   . Cancer ([place])    basal cell skin cancer  . Cataract   . Complication of anesthesia    Slow to awaken  . Constipation   . DDD (degenerative disc disease), cervical   . Dysphagia   . Edema extremities 01/13/2016  . GERD (gastroesophageal reflux disease)    Decdreased, has decreased coffee intack.  . Glaucoma   . HTN (hypertension)   . Hx of adenomatous colonic polyps 01/23/2016  . Hyperlipidemia   . Hypogonadism male   . Hypothyroidism   . OSA (obstructive sleep apnea)    Has had surgery x 2  . Parkinsonism ([place]) 01/16/2013   no tremors, balance issues only , speech and swallowing difficulties   . Pre-diabetes   . Sleep apnea    surgery x 2- no cpap  . Staph infection 10-2014  . Thyroid disease    HYPOTHYROID    Past Surgical History:  Procedure Laterality Date  . CATARACT EXTRACTION    . COLONOSCOPY  04-27-2004   tics, hems   . I&D EXTREMITY Right 10/18/2014   Procedure: IRRIGATION AND DEBRIDEMENT RIGHT KNEE PREPATELLA BURSA INFECTION;  Surgeon: [name], MD;  Location: [facility];  Service: Orthopedics;  Laterality: Right;  . [place] SURGERY  04-2015  . LUMBAR LAMINECTOMY WITH COFLEX 1 LEVEL N/A 06/06/2015   Procedure: Lumbar three-four Laminectomy/Foraminotomy with placement of Coflex;  Surgeon: [name], MD;  Location: [facility];  Service: Neurosurgery;  Laterality: N/A;  . NASAL SEPTUM SURGERY    . TONSILLECTOMY     and adenoids  . UVULOPALATOPHARYNGOPLASTY      There were no vitals filed for this visit.      Subjective Assessment - 01/14/17 1436    Subjective  Pt denies dizziness during session.   Pertinent History Progressive Supranuclear Palsy (PSP), hx of falls, cognitive deficits, glaucoma, lumbar stenosis, peripheral neuropathy   Limitations fall risk, cognitive deficits, impulsivity; honey thick liquids during meals (water ok >29mn after meals per wife) chin down and 2 hard swallows--see ST notes for details; **AVOID SUPINE SHOULDER EXERCISES AS PT REPORTS DIZZINESS**   Patient Stated Goals brushing hair, putting on deodorant, pulling on shirt, writing numbers   Currently in Pain? No/denies            [facility] 01[facility]17 0001      Precautions   Precautions Fall   Precaution Comments Honey-thick liquids with meals, water ok >315m after  meals, head down/double swallow per wife report (see ST notes for details)      Neuro re-ed:  PWR! Up in sitting (modified with no forward lean, UEs low, and within pain free range) for improved posture/shoulder positioning x 10 with min-mod cues For modifications.  Arm bike x76mn level 1 for reciprocal movement with cues/target of at least 40rpms for intensity while maintaining movement amplitude/reciprocal movement.   Pt maintained 37-42rpms.  Verbally cued pt of speed as pt has previously reported dizziness when looking up at the screen.  Self Care:   Writing phone numbers with good legibility/size.  Pt demo decr in size after approx 7 affecting legibility  (with fatigue per pt), min cues given for PWR! Hands.--goal met.  Washing/drying hands x2 with min-mod cueing for incr movement amplitude/strategy.  Twisting lid on/off bottle with min-mod cueing for incr movement amplitude/strategy.  Then simulated applying deodorant with min-mod cueing for incr movement amplitude.  Improved with repetition.  Combing hair with mirror with min-mod cueing for incr movement amplitude/strategy.                     OT Education - 01/14/17 1704    Education Details Shoulder HEP with ball (reviewed)   Person(s) Educated Patient;Spouse   Methods Explanation;Demonstration;Verbal cues   Comprehension Verbalized understanding;Returned demonstration;Verbal cues required          OT Short Term Goals - 01/14/17 1707      OT SHORT TERM GOAL #1   Title Pt will perform updated HEP with supervision/min cues.--check STGs 01/04/17   Time 4   Period Weeks   Status On-going  not fully met 01/14/17     OT SHORT TERM GOAL #2   Title Pt will wash/dry hands adequately with min cueing.   Time 4   Period Weeks   Status On-going  01/14/17  min-mod cueing     OT SHORT TERM GOAL #3   Title Pt will report incr ease with washing hair.   Baseline -----   Period Weeks   Status On-going     OT SHORT TERM GOAL #4   Title Pt will be able to put deodorant lid on/off and apply with min v.c. for movement amplitude.   Baseline -----   Time 4   Period Weeks   Status On-going  01/14/17  min-mod cueing (verbal and tactile)     OT SHORT TERM GOAL #5   Title ----------   Baseline -------           OT Long Term Goals - 01/14/17 1708      OT LONG TERM GOAL #1   Title Pt/caregiver will verbalize understanding of adaptive strategies/AE for ADLs prn.--check LTGs 02/04/17   Time 8   Period Weeks   Status New     OT LONG TERM GOAL #2   Title Pt will demonstrate improved RUE functional use as evidenced by increasing box/ blocks score to 40 blocks   Baseline 35    Period Weeks   Status New     OT LONG TERM GOAL #3   Title Pt will write phone number once given verbally with at least 90% legibility.   Time 8   Period Weeks   Status Achieved  01/07/17  performed at approx this level, will monitor for consistency.  01/14/17  Pt able to write at least 7 phone numbers prior to significant decr in size/legibility     OT LONG TERM GOAL #4   Title  Pt will be able to brush his hair with min cues for movement amplitude.   Time 8   Period Weeks   Status On-going  01/14/17  min-mod     OT LONG TERM GOAL #5   Title Pt will report improved shoulder pain as shown by ability to don/doff jacket mod I.   Baseline unable to doff due to R shoulder pain   Time 8   Period Weeks   Status New               Plan - 01/14/17 1445    Clinical Impression Statement Pt is progressing towards goals.  He demo improving ability to write phone numbers, but size does decr with fatigue.   Rehab Potential Good   Clinical Impairments Affecting Rehab Potential cognitive deficits, impulsivity   OT Frequency 2x / week   OT Duration 8 weeks   OT Treatment/Interventions Self-care/ADL training;Cryotherapy;Energy conservation;Neuromuscular education;Functional Mobility Training;Manual Therapy;Passive range of motion;Cognitive remediation/compensation;Therapeutic activities;Visual/perceptual remediation/compensation;Therapeutic exercises;Therapeutic exercise;Patient/family education;DME and/or AE instruction;Moist Heat;Balance training   Plan continue to address ADLs using large amplitude movement strategies   OT Home Exercise Plan Education provided:  12/25/16 shoulder flex with ball in supine, PWR! up in supine--d/c 01/07/17   Consulted and Agree with Plan of Care Patient;Family member/caregiver   Family Member Consulted wife--[name]      Patient will benefit from skilled therapeutic intervention in order to improve the following deficits and impairments:  Decreased coordination,  Decreased range of motion, Decreased safety awareness, Decreased knowledge of precautions, Impaired tone, Impaired UE functional use, Pain, Impaired perceived functional ability, Decreased mobility, Decreased cognition, Decreased balance, Decreased knowledge of use of DME  Visit Diagnosis: Other symptoms and signs involving the nervous system  Other symptoms and signs involving the musculoskeletal system  Other lack of coordination  Other abnormalities of gait and mobility  Frontal lobe and executive function deficit  Right shoulder pain, unspecified chronicity  Stiffness of right shoulder, not elsewhere classified  Unsteadiness on feet    Problem List Patient Active Problem List   Diagnosis Date Noted  . Peripheral neuropathy ([place]) 11/14/2016  . PSP (progressive supranuclear palsy) ([place]) 11/08/2016  . Hx of adenomatous colonic polyps 01/23/2016  . Edema extremities 01/13/2016  . Lumbar stenosis 06/06/2015  . HOCM (hypertrophic obstructive cardiomyopathy) ([place]) 12/12/2014  . Infection of right prepatellar bursa 10/18/2014  . DOE (dyspnea on exertion) 09/06/2014  . Fatigue due to excessive exertion 09/06/2014  . Testosterone deficiency 11/12/2013  . Routine general medical examination at a health care facility 08/30/2013  . Mixed hyperlipidemia 08/30/2013  . Other abnormal glucose 08/30/2013  . 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 Parkinsonism ([place]) 01/16/2013  . INSOMNIA 06/06/2008  . GLAUCOMA 06/01/2008  . Essential hypertension 06/01/2008  . Seasonal and perennial allergic rhinitis 06/01/2008    [facility] 01/14/2017, 5:19 PM  [facility] [address] [place], [place], [address] Phone: [phone]   Fax:  [phone]  Name: [name] MRN: [id number] Date of Birth: 1946/09/11   Vianne Bulls, OTR/L [facility] [address]. [place], [place]   [address] [phone] phone [phone] 01/14/17 5:19 PM

## 2017-01-16 ENCOUNTER — Ambulatory Visit: Payer: Medicare Other | Admitting: Physical Therapy

## 2017-01-16 ENCOUNTER — Encounter: Payer: Medicare Other | Admitting: Occupational Therapy

## 2017-01-20 ENCOUNTER — Other Ambulatory Visit: Payer: Self-pay | Admitting: Cardiology

## 2017-01-22 ENCOUNTER — Encounter: Payer: Medicare Other | Admitting: Occupational Therapy

## 2017-01-22 ENCOUNTER — Ambulatory Visit: Payer: Medicare Other | Admitting: Physical Therapy

## 2017-01-24 ENCOUNTER — Encounter: Payer: Medicare Other | Admitting: Occupational Therapy

## 2017-01-24 ENCOUNTER — Ambulatory Visit: Payer: Medicare Other | Admitting: Physical Therapy

## 2017-01-29 ENCOUNTER — Ambulatory Visit: Payer: Medicare Other | Admitting: Occupational Therapy

## 2017-01-29 ENCOUNTER — Ambulatory Visit: Payer: Medicare Other | Admitting: Physical Therapy

## 2017-01-29 DIAGNOSIS — R29818 Other symptoms and signs involving the nervous system: Secondary | ICD-10-CM

## 2017-01-29 DIAGNOSIS — R29898 Other symptoms and signs involving the musculoskeletal system: Secondary | ICD-10-CM | POA: Diagnosis not present

## 2017-01-29 DIAGNOSIS — M25511 Pain in right shoulder: Secondary | ICD-10-CM

## 2017-01-29 DIAGNOSIS — R41844 Frontal lobe and executive function deficit: Secondary | ICD-10-CM

## 2017-01-29 DIAGNOSIS — R278 Other lack of coordination: Secondary | ICD-10-CM

## 2017-01-29 DIAGNOSIS — M6281 Muscle weakness (generalized): Secondary | ICD-10-CM

## 2017-01-29 DIAGNOSIS — R2681 Unsteadiness on feet: Secondary | ICD-10-CM | POA: Diagnosis not present

## 2017-01-29 DIAGNOSIS — M25611 Stiffness of right shoulder, not elsewhere classified: Secondary | ICD-10-CM

## 2017-01-29 DIAGNOSIS — R2689 Other abnormalities of gait and mobility: Secondary | ICD-10-CM
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
## 2017-01-29 NOTE — Therapy (Signed)
[name] [address] [place], [place], [address] Phone: [phone]   Fax:  [phone]  Occupational Therapy Treatment  Patient Details  Name: [name] MRN: [id number] Date of Birth: 07/16/1946 Referring Provider: Dr. Star Age  Encounter Date: 01/29/2017      OT End of Session - 01/29/17 1713    Visit Number 6   Number of Visits 17   Date for OT Re-Evaluation 02/04/17   Authorization Type Medicare / BCBS, G-code needed   Authorization Time Period pt currently scheduled through 01/24/17   Authorization - Visit Number 6   Authorization - Number of Visits 10   OT Start Time 1150   OT Stop Time 1230   OT Time Calculation (min) 40 min   Activity Tolerance Patient tolerated treatment well   Behavior During Therapy Impulsive      Past Medical History:  Diagnosis Date  . Allergic rhinitis   . Allergy   . Arthritis   . At risk for falls   . Cancer ([place])    basal cell skin cancer  . Cataract   . Complication of anesthesia    Slow to awaken  . Constipation   . DDD (degenerative disc disease), cervical   . Dysphagia   . Edema extremities 01/13/2016  . GERD (gastroesophageal reflux disease)    Decdreased, has decreased coffee intack.  . Glaucoma   . HTN (hypertension)   . Hx of adenomatous colonic polyps 01/23/2016  . Hyperlipidemia   . Hypogonadism male   . Hypothyroidism   . OSA (obstructive sleep apnea)    Has had surgery x 2  . Parkinsonism ([place]) 01/16/2013   no tremors, balance issues only , speech and swallowing difficulties   . Pre-diabetes   . Sleep apnea    surgery x 2- no cpap  . Staph infection 10-2014  . Thyroid disease    HYPOTHYROID    Past Surgical History:  Procedure Laterality Date  . CATARACT EXTRACTION    . COLONOSCOPY  04-27-2004   tics, hems   . I&D EXTREMITY Right 10/18/2014   Procedure: IRRIGATION AND DEBRIDEMENT RIGHT KNEE PREPATELLA BURSA INFECTION;  Surgeon: [name], MD;  Location: [facility];  Service: Orthopedics;  Laterality: Right;  . [place] SURGERY  04-2015  . LUMBAR LAMINECTOMY WITH COFLEX 1 LEVEL N/A 06/06/2015   Procedure: Lumbar three-four Laminectomy/Foraminotomy with placement of Coflex;  Surgeon: [name], MD;  Location: [facility];  Service: Neurosurgery;  Laterality: N/A;  . NASAL SEPTUM SURGERY    . TONSILLECTOMY     and adenoids  . UVULOPALATOPHARYNGOPLASTY      There were no vitals filed for this visit.      Subjective Assessment - 01/29/17 1153    Subjective  Pt fell Sunday night when going to the bathroom   Pertinent History Progressive Supranuclear Palsy (PSP), hx of falls, cognitive deficits, glaucoma, lumbar stenosis, peripheral neuropathy   Limitations fall risk, cognitive deficits, impulsivity; honey thick liquids during meals (water ok >62mn after meals per wife) chin down and 2 hard swallows--see ST notes for details; **AVOID SUPINE SHOULDER EXERCISES AS PT REPORTS DIZZINESS**   Patient Stated Goals brushing hair, putting on deodorant, pulling on shirt, writing numbers   Currently in Pain? No/denies       Donning/doffing jacket:  Pt able to don without cueing mod I.  However, pt needed max cueing for strategy for doffing without pain.  Trunk rotation/head turn with pulling  off L shoulder with big movement, pull LUE out of jacket big, then remove from RUE.    Simulated donning deodorant with min-mod cueing for large amplitude movement strategies.  Stop-restart, "make a square," PWR! Hands.  Improved with repetition.  Twisting cap on/off with mod cueing initially, then min cueing for large amplitude movements/PWR! Hands and to verbalize strategy prior to start of task.    Simulated washing hair with min-mod cueing for large amplitude movement, particularly with LUE, improved with cueing/repetition.  Discussed fall going to bathroom in middle of the night.  Recommended use of urinal.  Pt/wife verbalized  understanding, but pt resistant to this.  Reviewed strategies for eating and recommendation to put utensil down after each bite to allow incr time for double swallow and improve timing and safety.  Reviewed that pt can move better with cueing, but that pt will continue to need cues due to nature of PSP and its affect on automatic, routine, repetitive movements.  Recommended varying cueing if pt begins to ignore cues (some visual, some verbal, some cueing pt to state strategy prior to task).  Pt/wife verbalized understanding.                          OT Short Term Goals - 01/29/17 1714      OT SHORT TERM GOAL #1   Title Pt will perform updated HEP with supervision/min cues.--check STGs 01/04/17   Time 4   Period Weeks   Status On-going  not fully met 01/14/17     OT SHORT TERM GOAL #2   Title Pt will wash/dry hands adequately with min cueing.   Time 4   Period Weeks   Status On-going  01/14/17  min-mod cueing     OT SHORT TERM GOAL #3   Title Pt will report incr ease with washing hair.   Baseline -----   Period Weeks   Status On-going     OT SHORT TERM GOAL #4   Title Pt will be able to put deodorant lid on/off and apply with min v.c. for movement amplitude.   Baseline -----   Time 4   Period Weeks   Status Not Met  01/14/17  min-mod cueing (verbal and tactile).  01/29/17  min-mod cueing (verbal only)     OT SHORT TERM GOAL #5   Title ----------   Baseline -------           OT Long Term Goals - 01/29/17 1715      OT LONG TERM GOAL #1   Title Pt/caregiver will verbalize understanding of adaptive strategies/AE for ADLs prn.--check LTGs 02/04/17   Time 8   Period Weeks   Status Achieved  01/29/17  pt/caregiver able to verbalize, but caregiver must provide cueing     OT LONG TERM GOAL #2   Title Pt will demonstrate improved RUE functional use as evidenced by increasing box/ blocks score to 40 blocks   Baseline 35   Period Weeks   Status New     OT  LONG TERM GOAL #3   Title Pt will write phone number once given verbally with at least 90% legibility.   Time 8   Period Weeks   Status Achieved  01/07/17  performed at approx this level, will monitor for consistency.  01/14/17  Pt able to write at least 7 phone numbers prior to significant decr in size/legibility     OT LONG TERM GOAL #4   Title  Pt will be able to brush his hair with min cues for movement amplitude.   Time 8   Period Weeks   Status On-going  01/14/17  min-mod     OT LONG TERM GOAL #5   Title Pt will report improved shoulder pain as shown by ability to don/doff jacket mod I.   Baseline unable to doff due to R shoulder pain   Time 8   Period Weeks   Status On-going  01/29/17:  doffed with mod-max cueing to use technique to decr pain               Plan - 01/29/17 1713    Clinical Impression Statement Pt able to verbalize strategies for ADLs, and demo improved ability to utilize if he is cued to stop and state strategy prior to task.  Pt will continue to need cueing due to impulsivity and cognitive deficits.   Rehab Potential Good   Clinical Impairments Affecting Rehab Potential cognitive deficits, impulsivity   OT Frequency 2x / week   OT Duration 8 weeks   OT Treatment/Interventions Self-care/ADL training;Cryotherapy;Energy conservation;Neuromuscular education;Functional Mobility Training;Manual Therapy;Passive range of motion;Cognitive remediation/compensation;Therapeutic activities;Visual/perceptual remediation/compensation;Therapeutic exercises;Therapeutic exercise;Patient/family education;DME and/or AE instruction;Moist Heat;Balance training   Plan check remaining goals and anticipate d/c next visit   OT Home Exercise Plan Education provided:  12/25/16 shoulder flex with ball in supine, PWR! up in supine--d/c 01/07/17   Consulted and Agree with Plan of Care Patient;Family member/caregiver   Family Member Consulted wife--[name]      Patient will benefit from  skilled therapeutic intervention in order to improve the following deficits and impairments:  Decreased coordination, Decreased range of motion, Decreased safety awareness, Decreased knowledge of precautions, Impaired tone, Impaired UE functional use, Pain, Impaired perceived functional ability, Decreased mobility, Decreased cognition, Decreased balance, Decreased knowledge of use of DME  Visit Diagnosis: Other symptoms and signs involving the nervous system  Other symptoms and signs involving the musculoskeletal system  Other lack of coordination  Other abnormalities of gait and mobility  Frontal lobe and executive function deficit  Right shoulder pain, unspecified chronicity  Stiffness of right shoulder, not elsewhere classified  Unsteadiness on feet  Muscle weakness (generalized)    Problem List Patient Active Problem List   Diagnosis Date Noted  . Peripheral neuropathy 11/14/2016  . PSP (progressive supranuclear palsy) ([facility]) 11/08/2016  . Hx of adenomatous colonic polyps 01/23/2016  . Edema extremities 01/13/2016  . Lumbar stenosis 06/06/2015  . HOCM (hypertrophic obstructive cardiomyopathy) ([place]) 12/12/2014  . Infection of right prepatellar bursa 10/18/2014  . DOE (dyspnea on exertion) 09/06/2014  . Fatigue due to excessive exertion 09/06/2014  . Testosterone deficiency 11/12/2013  . Routine general medical examination at a health care facility 08/30/2013  . Mixed hyperlipidemia 08/30/2013  . Other abnormal glucose 08/30/2013  . 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 Parkinsonism ([place]) 01/16/2013  . INSOMNIA 06/06/2008  . GLAUCOMA 06/01/2008  . Essential hypertension 06/01/2008  . Seasonal and perennial allergic rhinitis 06/01/2008    [facility] 01/29/2017, 5:19 PM  [facility] [address] [place], [place], [address] Phone: [phone]   Fax:  [phone]  Name: [name] MRN:  [id number] Date of Birth: 11/01/1945   Vianne Bulls, OTR/L [facility] [address]. [place], [place]  [address] [phone] phone [phone] 01/29/17 5:25 PM

## 2017-01-31 ENCOUNTER — Ambulatory Visit: Payer: Medicare Other | Admitting: Occupational Therapy

## 2017-01-31 ENCOUNTER — Ambulatory Visit: Payer: Medicare Other | Admitting: Physical Therapy

## 2017-01-31 DIAGNOSIS — R2681 Unsteadiness on feet: Secondary | ICD-10-CM | POA: Diagnosis not present

## 2017-01-31 DIAGNOSIS — M6281 Muscle weakness (generalized): Secondary | ICD-10-CM | POA: Diagnosis not present

## 2017-01-31 DIAGNOSIS — R29898 Other symptoms and signs involving the musculoskeletal system: Secondary | ICD-10-CM | POA: Diagnosis not present

## 2017-01-31 DIAGNOSIS — R29818 Other symptoms and signs involving the nervous system: Secondary | ICD-10-CM

## 2017-01-31 DIAGNOSIS — R278 Other lack of coordination: Secondary | ICD-10-CM | POA: Diagnosis not present

## 2017-01-31 DIAGNOSIS — R2689 Other abnormalities of gait and mobility: Secondary | ICD-10-CM

## 2017-01-31 NOTE — Therapy (Signed)
[facility] [address] [place], [place], [address] Phone: [phone]   Fax:  [phone]  Physical Therapy Treatment  Patient Details  Name: [name] MRN: [id number] Date of Birth: 27-Dec-1945 Referring Provider: Rexene Alberts  Encounter Date: 01/31/2017      PT End of Session - 01/31/17 2123    Visit Number 8   Number of Visits 17   Date for PT Re-Evaluation 02/04/17   Authorization Type Medicare Primary, BCBS second-GCODE every 10th visit   PT Start Time 1318   PT Stop Time 1356   PT Time Calculation (min) 38 min   Equipment Utilized During Treatment Gait belt   Activity Tolerance Patient tolerated treatment well   Behavior During Therapy Impulsive;WFL for tasks assessed/performed      Past Medical History:  Diagnosis Date  . Allergic rhinitis   . Allergy   . Arthritis   . At risk for falls   . Cancer ([facility])    basal cell skin cancer  . Cataract   . Complication of anesthesia    Slow to awaken  . Constipation   . DDD (degenerative disc disease), cervical   . Dysphagia   . Edema extremities 01/13/2016  . GERD (gastroesophageal reflux disease)    Decdreased, has decreased coffee intack.  . Glaucoma   . HTN (hypertension)   . Hx of adenomatous colonic polyps 01/23/2016  . Hyperlipidemia   . Hypogonadism male   . Hypothyroidism   . OSA (obstructive sleep apnea)    Has had surgery x 2  . Parkinsonism ([place]) 01/16/2013   no tremors, balance issues only , speech and swallowing difficulties   . Pre-diabetes   . Sleep apnea    surgery x 2- no cpap  . Staph infection 10-2014  . Thyroid disease    HYPOTHYROID    Past Surgical History:  Procedure Laterality Date  . CATARACT EXTRACTION    . COLONOSCOPY  04-27-2004   tics, hems   . I&D EXTREMITY Right 10/18/2014   Procedure: IRRIGATION AND DEBRIDEMENT RIGHT KNEE PREPATELLA BURSA INFECTION;  Surgeon: [name], MD;  Location: [facility];  Service: Orthopedics;   Laterality: Right;  . [place] SURGERY  04-2015  . LUMBAR LAMINECTOMY WITH COFLEX 1 LEVEL N/A 06/06/2015   Procedure: Lumbar three-four Laminectomy/Foraminotomy with placement of Coflex;  Surgeon: [name], MD;  Location: [facility];  Service: Neurosurgery;  Laterality: N/A;  . NASAL SEPTUM SURGERY    . TONSILLECTOMY     and adenoids  . UVULOPALATOPHARYNGOPLASTY      There were no vitals filed for this visit.      Subjective Assessment - 01/31/17 1318    Subjective Nothing new since last visit.  No changes.  Wife reports he may want to go outside.   Patient is accompained by: Family member  wife, [name]   Pertinent History Neuropathy, Parkinson's/PSP   Patient Stated Goals Pt's goal for therapy is to help with walking, getting up and down out of chair and bed   Currently in Pain? No/denies                         [facility] Adult PT Treatment/Exercise - 01/31/17 0001      Bed Mobility   Bed Mobility Rolling Left;Left Sidelying to Sit;Sit to Supine  3 reps of bed mobility practice   Rolling Left 5: Supervision;With rail  Simulated rail   Rolling Left Details (indicate cue  type and reason) Pt holds onto simulated rail and pulls strongly on rail with bilateral UEs.  Cues provided for use of rocking, momentum, hand placement and ability to push up with LUE from bed and RUE from rail.  Improved with cues and practice   Left Sidelying to Sit 5: Supervision;4: Min guard   Left Sidelying to Sit Details (indicate cue type and reason) See above   Sit to Supine - Details (indicate cue type and reason) Wife reports patient tries various methods of bed mobility at home, reports she tries providing cues, but he doesn't always follow cues.     Ambulation/Gait   Ambulation/Gait Yes   Ambulation/Gait Assistance 5: Supervision;4: Min guard   Ambulation/Gait Assistance Details Short distance gait activities in gym area with turning around and changes of directions.  Cues for  moving walker and deliberate steps/foot placement during turns   Ambulation Distance (Feet) 200 Feet  indoors, then 600 ft outdoors   Assistive device Rolling walker   Gait Pattern Step-through pattern;Festinating;Trunk flexed;Narrow base of support   Ambulation Surface Level;Indoor;Unlevel;Outdoor   Gait Comments With outdoor gait,  cues provided for patient to stay within walker's BOS and to less UE support and forward lean on walker when on sidewalk and outdoor surfaces.  Pt needs frequent cueing.     Self-Care   Self-Care Other Self-Care Comments   Other Self-Care Comments  Reviewed fall prevention education, especially need for patient to slow down and follow cues for improved safety with mobility.  Pt questions whether he could use UP walker.  Discussed cons of use of that type of walker for patient and need to stay with rolling walker.  Discussed plans for discharge this visit, continuing HEP and plan for return eval in 6 months due to progressive nature of disesase process.                PT Education - 01/31/17 2122    Education provided Yes   Education Details Bed mobility techniques, safety with gait and fall prevention   Person(s) Educated Patient;Spouse   Methods Explanation;Demonstration;Verbal cues   Comprehension Verbalized understanding;Returned demonstration;Verbal cues required          PT Short Term Goals - 01/29/17 1122      PT SHORT TERM GOAL #1   Title Pt will perform HEP for strength, flexibility, balance and gait with wife's supervision.  TARGET 01/06/17   Time 4   Period Weeks   Status Achieved     PT SHORT TERM GOAL #2   Title Pt will perform 5x sit<>stand in less than or equal to 20 seconds (with minimal UE support and no posterior lean) for improved safety and efficiency with transfers.   Baseline 22.72 sec with initial posterior lean 01/29/17   Time 4   Period Weeks   Status Not Met     PT SHORT TERM GOAL #3   Title Pt will improve TUG score  to less than or equal to 23 seconds for decreased fall risk.   Baseline 25.75 sec 01/29/17   Time 4   Period Weeks   Status Not Met     PT SHORT TERM GOAL #4   Title Pt/wife will report at least 25% improvement in bed mobility, for improved safety/decreased caregiver burden with bed moiblity.   Baseline Reports bed mobility the same   Time 4   Period Weeks   Status Not Met           PT Long  Term Goals - 02-07-17 1320      PT LONG TERM GOAL #1   Title Pt/wife will verbalize understanding of fall prevention, including tips to reduce freezing.  TARGET 02/04/17   Time 8   Period Weeks   Status Achieved     PT LONG TERM GOAL #2   Title Pt will improve Berg Balance score to at least 25/56 for decreased fall risk.   Time 8   Period Weeks   Status Achieved     PT LONG TERM GOAL #3   Title Pt will improve gait velocity to at least 2.3 ft/sec for improved efficiency and safety with gait.   Baseline 2.33 ft/sec 01/29/17   Time 8   Period Weeks   Status Achieved               Plan - Feb 07, 2017 2124    Clinical Impression Statement LTGs 1-3 met, with improved Berg and gait velocity scores since PT evaluation.  Pt continues to have significant safety concerns due to impulsivity with transfers and gait.  Pt and wife have bee extensively educated in safety with transfers, bed mobility, and gait and pt is appropriate for discharge from PT.   Rehab Potential Fair   Clinical Impairments Affecting Rehab Potential decreased safety awareness    PT Frequency 2x / week   PT Duration 8 weeks  plus eval   PT Treatment/Interventions ADLs/Self Care Home Management;Functional mobility training;Gait training;Patient/family education;Orthotic Fit/Training;Neuromuscular re-education;Balance training;Therapeutic exercise;Therapeutic activities   PT Next Visit Plan Discharge this visit.  Likely return eval in 6 months   Consulted and Agree with Plan of Care Patient;Family member/caregiver   Family  Member Consulted [name]      Patient will benefit from skilled therapeutic intervention in order to improve the following deficits and impairments:  Abnormal gait, Decreased balance, Decreased mobility, Decreased range of motion, Decreased safety awareness, Decreased strength, Difficulty walking, Impaired flexibility, Postural dysfunction  Visit Diagnosis: Other abnormalities of gait and mobility  Other symptoms and signs involving the nervous system       G-Codes - Feb 07, 2017 12/14/2124    Functional Assessment Tool Used (Outpatient Only) 5x sit<>stand 22 sec, Berg 26/56, gait velocity 2.34 ft/sec   Functional Limitation Mobility: Walking and moving around   Mobility: Walking and Moving Around Goal Status [phone]) At least 20 percent but less than 40 percent impaired, limited or restricted   Mobility: Walking and Moving Around Discharge Status [phone]) At least 20 percent but less than 40 percent impaired, limited or restricted      Problem List Patient Active Problem List   Diagnosis Date Noted  . Peripheral neuropathy 11/14/2016  . PSP (progressive supranuclear palsy) ([place]) 11/08/2016  . Hx of adenomatous colonic polyps 01/23/2016  . Edema extremities 01/13/2016  . Lumbar stenosis 06/06/2015  . HOCM (hypertrophic obstructive cardiomyopathy) ([place]) 12/12/2014  . Infection of right prepatellar bursa 10/18/2014  . DOE (dyspnea on exertion) 09/06/2014  . Fatigue due to excessive exertion 09/06/2014  . Testosterone deficiency 11/12/2013  . Routine general medical examination at a health care facility 08/30/2013  . Mixed hyperlipidemia 08/30/2013  . Other abnormal glucose 08/30/2013  . 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 Parkinsonism ([place]) 01/16/2013  . INSOMNIA 06/06/2008  . GLAUCOMA 06/01/2008  . Essential hypertension 06/01/2008  . Seasonal and perennial allergic rhinitis 06/01/2008    Kelsee Preslar W. 02/07/17, 9:27 PM  Frazier Butt., PT  [facility] [address] [place], [place],  [address] Phone: [phone]   Fax:  [phone]  Name: [name] MRN: [id number] Date of Birth: [birth year]   PHYSICAL THERAPY DISCHARGE SUMMARY  Visits from Start of Care: 8  Current functional level related to goals / functional outcomes: See LTGs above-pt has met all three LTGs   Remaining deficits: Decreased safety awareness, decreased balance, decreased independence with gait   Education / Equipment: Educated in proper technique with HEP, safety education and fall prevention education  Plan: Patient agrees to discharge.  Patient goals were met. Patient is being discharged due to meeting the stated rehab goals.  ?????Pt will benefit from eval in 6-9 months due to progressive nature of neurological disease process.  Mady Haagensen, PT 01/31/17 9:30 PM Phone: [phone] Fax: [phone]

## 2017-01-31 NOTE — Therapy (Signed)
[facility] [address] [place], [place], [address] Phone: [phone]   Fax:  [phone]  Occupational Therapy Treatment  Patient Details  Name: [name] MRN: [id number] Date of Birth: July 17, 1946 Referring Provider: Dr. Star Age  Encounter Date: 01/31/2017      OT End of Session - 01/31/17 1656    Visit Number 7   Number of Visits 17   Date for OT Re-Evaluation 02/04/17   Authorization Type Medicare / BCBS, G-code needed   Authorization Time Period pt currently scheduled through 01/24/17   Authorization - Visit Number 7   Authorization - Number of Visits 10   OT Start Time 1406   OT Stop Time 1446   OT Time Calculation (min) 40 min      Past Medical History:  Diagnosis Date  . Allergic rhinitis   . Allergy   . Arthritis   . At risk for falls   . Cancer ([place])    basal cell skin cancer  . Cataract   . Complication of anesthesia    Slow to awaken  . Constipation   . DDD (degenerative disc disease), cervical   . Dysphagia   . Edema extremities 01/13/2016  . GERD (gastroesophageal reflux disease)    Decdreased, has decreased coffee intack.  . Glaucoma   . HTN (hypertension)   . Hx of adenomatous colonic polyps 01/23/2016  . Hyperlipidemia   . Hypogonadism male   . Hypothyroidism   . OSA (obstructive sleep apnea)    Has had surgery x 2  . Parkinsonism ([place]) 01/16/2013   no tremors, balance issues only , speech and swallowing difficulties   . Pre-diabetes   . Sleep apnea    surgery x 2- no cpap  . Staph infection 10-2014  . Thyroid disease    HYPOTHYROID    Past Surgical History:  Procedure Laterality Date  . CATARACT EXTRACTION    . COLONOSCOPY  04-27-2004   tics, hems   . I&D EXTREMITY Right 10/18/2014   Procedure: IRRIGATION AND DEBRIDEMENT RIGHT KNEE PREPATELLA BURSA INFECTION;  Surgeon: [name], MD;  Location: [facility];  Service: Orthopedics;  Laterality: Right;  . [place]  SURGERY  04-2015  . LUMBAR LAMINECTOMY WITH COFLEX 1 LEVEL N/A 06/06/2015   Procedure: Lumbar three-four Laminectomy/Foraminotomy with placement of Coflex;  Surgeon: [name], MD;  Location: [facility];  Service: Neurosurgery;  Laterality: N/A;  . NASAL SEPTUM SURGERY    . TONSILLECTOMY     and adenoids  . UVULOPALATOPHARYNGOPLASTY      There were no vitals filed for this visit.      Subjective Assessment - 01/31/17 1651    Pertinent History Progressive Supranuclear Palsy (PSP), hx of falls, cognitive deficits, glaucoma, lumbar stenosis, peripheral neuropathy   Limitations fall risk, cognitive deficits, impulsivity; honey thick liquids during meals (water ok >48mn after meals per wife) chin down and 2 hard swallows--see ST notes for details; **AVOID SUPINE SHOULDER EXERCISES AS PT REPORTS DIZZINESS**   Patient Stated Goals brushing hair, putting on deodorant, pulling on shirt, writing numbers   Currently in Pain? No/denies                Therapist checked progress towards remaining goals.              OT Education - 01/31/17 1653    Education provided Yes   Education Details reviewed donning doffing jacket and ADL strategies with big movements, ball HEP in  seated,   Person(s) Educated Patient;Spouse   Methods Demonstration;Explanation;Tactile cues;Verbal cues   Comprehension Verbalized understanding;Returned demonstration;Verbal cues required          OT Short Term Goals - 01/31/17 1410      OT SHORT TERM GOAL #1   Title Pt will perform updated HEP with supervision/min cues.--check STGs 01/04/17   Time 4   Period Weeks   Status Achieved     OT SHORT TERM GOAL #2   Title Pt will wash/dry hands adequately with min cueing.   Time 4   Period Weeks   Status Partially Met  01/14/17  min-mod cueing     OT SHORT TERM GOAL #3   Title Pt will report incr ease with washing hair.   Baseline -----   Period Weeks   Status Achieved     OT SHORT TERM GOAL  #4   Title Pt will be able to put deodorant lid on/off and apply with min v.c. for movement amplitude.   Baseline -----   Time 4   Period Weeks   Status Not Met  01/14/17  min-mod cueing (verbal and tactile).  01/29/17  min-mod cueing (verbal only)     OT SHORT TERM GOAL #5   Title ----------   Baseline -------           OT Long Term Goals - 01/31/17 1414      OT LONG TERM GOAL #1   Title Pt/caregiver will verbalize understanding of adaptive strategies/AE for ADLs prn.--check LTGs 02/04/17   Time 8   Period Weeks   Status Achieved  01/29/17  pt/caregiver able to verbalize, but caregiver must provide cueing     OT LONG TERM GOAL #2   Title Pt will demonstrate improved RUE functional use as evidenced by increasing box/ blocks score to 40 blocks   Baseline 35   Period Weeks   Status Achieved  RUE 50 blocks, LUE 57 blocks     OT LONG TERM GOAL #3   Title Pt will write phone number once given verbally with at least 90% legibility.   Time 8   Period Weeks   Status Achieved  01/07/17  performed at approx this level, will monitor for consistency.  01/14/17  Pt able to write at least 7 phone numbers prior to significant decr in size/legibility     OT LONG TERM GOAL #4   Title Pt will be able to brush his hair with min cues for movement amplitude.   Time 8   Period Weeks   Status Partially Met  01/14/17  min-mod, variable      OT LONG TERM GOAL #5   Title Pt will report improved shoulder pain as shown by ability to don/doff jacket mod I.   Baseline unable to doff due to R shoulder pain   Time 8   Period Weeks   Status Not Met  01/29/17:  doffed with mod-max cueing to use technique to decr pain               Plan - 01/31/17 1654    Clinical Impression Statement Pt and wife agree with plans for d/c. Pt continues to require supervision and cueing for safety and big movements with ADLs.   Rehab Potential Good   Clinical Impairments Affecting Rehab Potential cognitive  deficits, impulsivity   OT Frequency 2x / week   OT Duration 8 weeks   OT Treatment/Interventions Self-care/ADL training;Cryotherapy;Energy conservation;Neuromuscular education;Functional Mobility Training;Manual Therapy;Passive range of motion;Cognitive remediation/compensation;Therapeutic  activities;Visual/perceptual remediation/compensation;Therapeutic exercises;Therapeutic exercise;Patient/family education;DME and/or AE instruction;Moist Heat;Balance training   Plan d/c OT   OT Home Exercise Plan Education provided:  12/25/16 shoulder flex with ball in supine, PWR! up in supine--d/c 01/07/17   Consulted and Agree with Plan of Care Patient;Family member/caregiver   Family Member Consulted wife--[name]      Patient will benefit from skilled therapeutic intervention in order to improve the following deficits and impairments:  Decreased coordination, Decreased range of motion, Decreased safety awareness, Decreased knowledge of precautions, Impaired tone, Impaired UE functional use, Pain, Impaired perceived functional ability, Decreased mobility, Decreased cognition, Decreased balance, Decreased knowledge of use of DME  Visit Diagnosis: Other symptoms and signs involving the nervous system  Other symptoms and signs involving the musculoskeletal system  Other lack of coordination      G-Codes - 2017/02/27 1657    Functional Assessment Tool Used (Outpatient only) clinical observation/needs  min-mod cueing for safety and effeciency with ADLs   Functional Limitation Self care   Self Care Goal Status (M1962) At least 20 percent but less than 40 percent impaired, limited or restricted   Self Care Discharge Status (I2979) At least 40 percent but less than 60 percent impaired, limited or restricted    .OCCUPATIONAL THERAPY DISCHARGE SUMMARY   Current functional level related to goals / functional outcomes: Pt demonstrates progress however he continues to require verbal cueing / assistance for safety  and big movements.   Remaining deficits: Visual perceptual deficits, cognitive deficits, shoulder pain, decreased balance, decreased gait/ mobility, freezing, hypokinesia   Education / Equipment: Pt/ wife were instructed in adapted strategies for ADLS and HEP. Pt/ wife verbalize understanding. Pt can benefit from a re-eval in 6 mons due to the progressive nature of his diagnosis. Plan: Patient agrees to discharge.  Patient goals were not met. Patient is being discharged due to meeting the stated rehab goals.  ?????      Problem List Patient Active Problem List   Diagnosis Date Noted  . Peripheral neuropathy 11/14/2016  . PSP (progressive supranuclear palsy) ([place]) 11/08/2016  . Hx of adenomatous colonic polyps 01/23/2016  . Edema extremities 01/13/2016  . Lumbar stenosis 06/06/2015  . HOCM (hypertrophic obstructive cardiomyopathy) ([place]) 12/12/2014  . Infection of right prepatellar bursa 10/18/2014  . DOE (dyspnea on exertion) 09/06/2014  . Fatigue due to excessive exertion 09/06/2014  . Testosterone deficiency 11/12/2013  . Routine general medical examination at a health care facility 08/30/2013  . Mixed hyperlipidemia 08/30/2013  . Other abnormal glucose 08/30/2013  . 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 Parkinsonism ([place]) 01/16/2013  . INSOMNIA 06/06/2008  . GLAUCOMA 06/01/2008  . Essential hypertension 06/01/2008  . Seasonal and perennial allergic rhinitis 06/01/2008    Malikah Principato 02/27/2017, 4:58 PM Theone Murdoch, OTR/L [phone] Phone: [phone] 5:04 PM 2017-02-27 [facility] [address] [place], [place], [address] Phone: [phone]   Fax:  [phone]  Name: [name] MRN: [id number] Date of Birth: 1946-08-10

## 2017-02-13 ENCOUNTER — Encounter: Payer: Self-pay | Admitting: Cardiology

## 2017-02-13 ENCOUNTER — Ambulatory Visit: Payer: Medicare Other | Admitting: Cardiology

## 2017-03-04 NOTE — Progress Notes (Signed)
Cardiology Office Note    Date:  03/05/2017   ID:  [name], DOB 07-22-1946, MRN [id number]  PCP:  [name], MD  Cardiologist:  [name], MD   Chief Complaint  Patient presents with  . Follow-up    HOCM, HTN    History of Present Illness:  [name] is a 71 y.o. male with a history of HTN, OSA, Parkinson's, hyperlipidemia, pre- diabetes, asymmetric septal hypertrophy c/w HOCM with no outflow tact gradientand hypothyroidism who presents today for followup.   He is doing well and he denies any chest pain or pressure. He does not have SOB when walking in the house but has chronic SOB when bending over that has not changed.  He denies any palpitations, dizziness or syncope. He intermittently has some mild LE edema after being up for a while and improves with compression hose.  Past Medical History:  Diagnosis Date  . Allergic rhinitis   . Allergy   . Arthritis   . At risk for falls   . Cancer ([place])    basal cell skin cancer  . Cataract   . Complication of anesthesia    Slow to awaken  . Constipation   . DDD (degenerative disc disease), cervical   . Dysphagia   . Edema extremities 01/13/2016  . GERD (gastroesophageal reflux disease)    Decdreased, has decreased coffee intack.  . Glaucoma   . HTN (hypertension)   . Hx of adenomatous colonic polyps 01/23/2016  . Hyperlipidemia   . Hypogonadism male   . Hypothyroidism   . OSA (obstructive sleep apnea)    Has had surgery x 2  . Parkinsonism ([place]) 01/16/2013   no tremors, balance issues only , speech and swallowing difficulties   . Pre-diabetes   . Sleep apnea    surgery x 2- no cpap  . Staph infection 10-2014  . Thyroid disease    HYPOTHYROID    Past Surgical History:  Procedure Laterality Date  . CATARACT EXTRACTION    . COLONOSCOPY  04-27-2004   tics, hems   . I&D EXTREMITY Right 10/18/2014   Procedure: IRRIGATION AND DEBRIDEMENT RIGHT KNEE PREPATELLA BURSA INFECTION;  Surgeon: [name], MD;  Location: [facility];  Service: Orthopedics;  Laterality: Right;  . [place] SURGERY  04-2015  . LUMBAR LAMINECTOMY WITH COFLEX 1 LEVEL N/A 06/06/2015   Procedure: Lumbar three-four Laminectomy/Foraminotomy with placement of Coflex;  Surgeon: [name], MD;  Location: [facility];  Service: Neurosurgery;  Laterality: N/A;  . NASAL SEPTUM SURGERY    . TONSILLECTOMY     and adenoids  . UVULOPALATOPHARYNGOPLASTY      Current Medications: Current Meds  Medication Sig  . aspirin 81 MG tablet Take 81 mg by mouth daily.  Marland Kitchen BYSTOLIC 2.5 MG tablet TAKE 1 TABLET(2.5 MG) BY MOUTH DAILY  . Cholecalciferol (VITAMIN D3) 10000 UNITS capsule Take 10,000 Units by mouth 2 (two) times a week. Mon. ,[id number] weekly  . finasteride (PROSCAR) 5 MG tablet Take 5 mg by mouth daily.  . fluticasone (FLONASE) 50 MCG/ACT nasal spray USE 1-2 SPRAYS IN EACH NOSTRIL DAILY  . levothyroxine (SYNTHROID, LEVOTHROID) 50 MCG tablet Take 1 tablet (50 mcg total) by mouth daily.  Marland Kitchen loratadine (CLARITIN) 10 MG tablet Take 10 mg by mouth daily.  Marland Kitchen losartan (COZAAR) 100 MG tablet TAKE 1 TABLET BY MOUTH DAILY  . Multiple Vitamins-Minerals (MULTIVITAMIN WITH MINERALS) tablet Take 1 tablet by mouth daily.    . Naproxen  Sodium (ALEVE) 220 MG CAPS Take by mouth.  . timolol (BETIMOL) 0.5 % ophthalmic solution 1 drop 2 (two) times daily.    Allergies:   Ambien [zolpidem tartrate]; Atenolol; Lunesta [eszopiclone]; Oxazepam; Requip [ropinirole hcl]; and Ziac [bisoprolol-hydrochlorothiazide]   Social History   Social History  . Marital status: Married    Spouse name: [name]  . Number of children: 2  . Years of education: college   Occupational History  . branch Librarian, academic   .  Retired   Social History Main Topics  . Smoking status: Never Smoker  . Smokeless tobacco: Never Used  . Alcohol use No  . Drug use: No  . Sexual activity: Not Asked   Other Topics Concern  . None   Social History Narrative  .  None     Family History:  The patient's family history includes COPD in his mother and sister; Cancer in his father; Colon polyps in his brother and father; Diabetes in his father; Emphysema in his mother; Heart attack in his sister; Heart disease in his mother and sister; Hypertension in his father, mother, and sister; Liver cancer in his father.   ROS:   Please see the history of present illness.    ROS All other systems reviewed and are negative.  No flowsheet data found.     PHYSICAL EXAM:   VS:  BP 118/60   Pulse (!) 56   Ht 5\' 10"  (1.778 m)   Wt 202 lb 12.8 oz (92 kg)   BMI 29.10 kg/m    GEN: Well nourished, well developed, in no acute distress  HEENT: normal  Neck: no JVD, carotid bruits, or masses Cardiac: RRR; no murmurs, rubs, or gallops,no edema.  Intact distal pulses bilaterally.  Respiratory:  clear to auscultation bilaterally, normal work of breathing GI: soft, nontender, nondistended, + BS MS: no deformity or atrophy  Skin: warm and dry, no rash Neuro:  Alert and Oriented x 3, Strength and sensation are intact Psych: euthymic mood, full affect  Wt Readings from Last 3 Encounters:  03/05/17 202 lb 12.8 oz (92 kg)  11/19/16 200 lb (90.7 kg)  11/14/16 199 lb 9.6 oz (90.5 kg)      Studies/Labs Reviewed:   EKG:  EKG is ordered today.  The ekg ordered today demonstrates Sinus bradycardia 56bpm with no ST changes  Recent Labs: 11/08/2016: TSH 2.06   Lipid Panel    Component Value Date/Time   CHOL 185 09/30/2015 1117   TRIG 191.0 (H) 09/30/2015 1117   HDL 38.90 (L) 09/30/2015 1117   CHOLHDL 5 09/30/2015 1117   VLDL 38.2 09/30/2015 1117   LDLCALC 108 (H) 09/30/2015 1117    Additional studies/ records that were reviewed today include:  none    ASSESSMENT:    1. HOCM (hypertrophic obstructive cardiomyopathy) ([place])   2. Essential hypertension   3. Edema extremities      PLAN:  In order of problems listed above:  1. HOCM with last echo showing  severe ASSH with septal thickness 57mm and normal LVF  There was no LVOT obstruction and on SAM.  He has chronic SOB and LE edema.  He has not had an echo since 2015.  I will repeat an echo to reassess.  He is asymptomatic.  I have encouraged his wife to have their kids screened with an echo.  He will continue on Bystolic 2.5mg  daily and Cozaar.  2. HTN - His BP is adequately controlled on exam today. He will  continue on Cozaar 100mg  daily and Bystolic 2.5mg  daily.    3. Chronic LE edema which is stable.  He will continue on compression hose.  No diuretics as he has had orthostasis in the past.    Medication Adjustments/Labs and Tests Ordered: Current medicines are reviewed at length with the patient today.  Concerns regarding medicines are outlined above.  Medication changes, Labs and Tests ordered today are listed in the Patient Instructions below.  There are no Patient Instructions on file for this visit.   Signed, [name], MD  03/05/2017 2:45 PM    [facility] [address], [place], [place]  [address] Phone: [phone]; Fax: [phone]

## 2017-03-05 ENCOUNTER — Encounter: Payer: Self-pay | Admitting: Cardiology

## 2017-03-05 ENCOUNTER — Ambulatory Visit (INDEPENDENT_AMBULATORY_CARE_PROVIDER_SITE_OTHER): Payer: Medicare Other | Admitting: Cardiology

## 2017-03-05 VITALS — BP 118/60 | HR 56 | Ht 70.0 in | Wt 202.8 lb

## 2017-03-05 DIAGNOSIS — I10 Essential (primary) hypertension: Secondary | ICD-10-CM | POA: Diagnosis not present

## 2017-03-05 DIAGNOSIS — R600 Localized edema: Secondary | ICD-10-CM | POA: Diagnosis not present

## 2017-03-05 DIAGNOSIS — I421 Obstructive hypertrophic cardiomyopathy: Secondary | ICD-10-CM | POA: Diagnosis not present

## 2017-03-05 NOTE — Patient Instructions (Signed)
Medication Instructions:  Your physician recommends that you continue on your current medications as directed. Please refer to the Current Medication list given to you today.   Labwork: None   Testing/Procedures: Your physician has requested that you have an echocardiogram. Echocardiography is a painless test that uses sound waves to create images of your heart. It provides your doctor with information about the size and shape of your heart and how well your heart's chambers and valves are working. This procedure takes approximately one hour. There are no restrictions for this procedure.    Follow-Up: Your physician wants you to follow-up in: 1 year with Dr [name]. (May 2019). You will receive a reminder letter in the mail two months in advance. If you don't receive a letter, please call our office to schedule the follow-up appointment.       If you need a refill on your cardiac medications before your next appointment, please call your pharmacy.

## 2017-03-20 ENCOUNTER — Other Ambulatory Visit: Payer: Self-pay | Admitting: Cardiology

## 2017-03-25 ENCOUNTER — Ambulatory Visit (HOSPITAL_COMMUNITY): Payer: Medicare Other | Attending: Cardiology

## 2017-03-25 ENCOUNTER — Other Ambulatory Visit: Payer: Self-pay

## 2017-03-25 DIAGNOSIS — I421 Obstructive hypertrophic cardiomyopathy: Secondary | ICD-10-CM | POA: Insufficient documentation

## 2017-03-25 DIAGNOSIS — I10 Essential (primary) hypertension: Secondary | ICD-10-CM | POA: Insufficient documentation

## 2017-03-25 DIAGNOSIS — I071 Rheumatic tricuspid insufficiency: Secondary | ICD-10-CM | POA: Diagnosis not present

## 2017-03-25 DIAGNOSIS — R600 Localized edema: Secondary | ICD-10-CM | POA: Diagnosis not present

## 2017-03-25 LAB — ECHOCARDIOGRAM COMPLETE
AVLVOTPG: 5 mmHg
Ao-asc: 36 cm
CHL CUP LV S' LATERAL: 13.9 cm/s
CHL CUP MV DEC (S): 324
CHL CUP STROKE VOLUME: 37 mL
E decel time: 324 msec
EERAT: 7.14
FS: 36 % (ref 28–44)
IVS/LV PW RATIO, ED: 1.61
LA ID, A-P, ES: 38 mm
LA diam index: 1.81 cm/m2
LA vol index: 24.8 mL/m2
LA vol: 52 mL
LAVOLA4C: 42 mL
LEFT ATRIUM END SYS DIAM: 38 mm
LV PW d: 12 mm — AB (ref 0.6–1.1)
LV TDI E'LATERAL: 7.21
LV TDI E'MEDIAL: 4.97
LV dias vol index: 29 mL/m2
LV dias vol: 60 mL — AB (ref 62–150)
LV e' LATERAL: 7.21 cm/s
LV sys vol index: 11 mL/m2
LVEEAVG: 7.14
LVEEMED: 7.14
LVOT SV: 82 mL
LVOT VTI: 26.2 cm
LVOT area: 3.14 cm2
LVOT diameter: 20 mm
LVOT peak vel: 112 cm/s
LVSYSVOL: 23 mL (ref 21–61)
MVPKAVEL: 53.5 m/s
MVPKEVEL: 51.5 m/s
Reg peak vel: 204 cm/s
Simpson's disk: 62
TRMAXVEL: 204 cm/s

## 2017-03-26 ENCOUNTER — Telehealth: Payer: Self-pay | Admitting: Cardiology

## 2017-03-26 NOTE — Telephone Encounter (Signed)
-----   Message from Sueanne Margarita, MD sent at 03/25/2017  3:31 PM EDT ----- Compared to prior study, the IVS wall thickness has decreased and no evidence of LVOT obstruction or SAM

## 2017-03-26 NOTE — Telephone Encounter (Signed)
New message     Pt wife is returning your call

## 2017-03-26 NOTE — Telephone Encounter (Signed)
Informed patient and his wife of results and verbal understanding expressed.

## 2017-05-11 ENCOUNTER — Other Ambulatory Visit: Payer: Self-pay | Admitting: Internal Medicine

## 2017-06-06 DIAGNOSIS — G950 Syringomyelia and syringobulbia: Secondary | ICD-10-CM | POA: Diagnosis not present

## 2017-06-06 DIAGNOSIS — M4802 Spinal stenosis, cervical region: Secondary | ICD-10-CM | POA: Diagnosis not present

## 2017-06-19 DIAGNOSIS — G950 Syringomyelia and syringobulbia: Secondary | ICD-10-CM | POA: Diagnosis not present

## 2017-06-19 DIAGNOSIS — Z6829 Body mass index (BMI) 29.0-29.9, adult: Secondary | ICD-10-CM | POA: Diagnosis not present

## 2017-06-27 ENCOUNTER — Encounter: Payer: Self-pay | Admitting: Internal Medicine

## 2017-07-04 DIAGNOSIS — Z23 Encounter for immunization: Secondary | ICD-10-CM | POA: Diagnosis not present

## 2017-07-22 ENCOUNTER — Other Ambulatory Visit: Payer: Self-pay | Admitting: Internal Medicine

## 2017-07-22 DIAGNOSIS — D3131 Benign neoplasm of right choroid: Secondary | ICD-10-CM | POA: Diagnosis not present

## 2017-07-22 DIAGNOSIS — H401132 Primary open-angle glaucoma, bilateral, moderate stage: Secondary | ICD-10-CM | POA: Diagnosis not present

## 2017-07-22 DIAGNOSIS — H43813 Vitreous degeneration, bilateral: Secondary | ICD-10-CM | POA: Diagnosis not present

## 2017-07-22 DIAGNOSIS — Z961 Presence of intraocular lens: Secondary | ICD-10-CM | POA: Diagnosis not present

## 2017-10-22 ENCOUNTER — Other Ambulatory Visit: Payer: Self-pay | Admitting: Internal Medicine

## 2017-10-25 DIAGNOSIS — N3946 Mixed incontinence: Secondary | ICD-10-CM | POA: Diagnosis not present

## 2017-11-07 ENCOUNTER — Other Ambulatory Visit: Payer: Self-pay | Admitting: Internal Medicine

## 2017-11-20 ENCOUNTER — Encounter: Payer: Self-pay | Admitting: Neurology

## 2017-11-20 ENCOUNTER — Ambulatory Visit (INDEPENDENT_AMBULATORY_CARE_PROVIDER_SITE_OTHER): Payer: Medicare Other | Admitting: Neurology

## 2017-11-20 VITALS — BP 123/75 | HR 46 | Ht 70.0 in | Wt 203.0 lb

## 2017-11-20 DIAGNOSIS — G231 Progressive supranuclear ophthalmoplegia [Steele-Richardson-Olszewski]: Secondary | ICD-10-CM

## 2017-11-20 DIAGNOSIS — R1310 Dysphagia, unspecified: Secondary | ICD-10-CM

## 2017-11-20 DIAGNOSIS — Z9181 History of falling: Secondary | ICD-10-CM | POA: Diagnosis not present
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
## 2017-11-20 NOTE — Patient Instructions (Addendum)
Please use your walker consistently.  Fall risk is real! Please stay well hydrated.  Please follow up at [facility] as planned.  I will see you back in one year.  Your memory is stable.  Be careful when eating and drinking, no thin fluids, as your swallow study showed silent aspiration last year.

## 2017-12-07 ENCOUNTER — Other Ambulatory Visit: Payer: Self-pay | Admitting: Internal Medicine

## 2018-01-01 DIAGNOSIS — G231 Progressive supranuclear ophthalmoplegia [Steele-Richardson-Olszewski]: Secondary | ICD-10-CM | POA: Diagnosis not present

## 2018-01-15 ENCOUNTER — Ambulatory Visit (INDEPENDENT_AMBULATORY_CARE_PROVIDER_SITE_OTHER): Payer: Medicare Other

## 2018-01-15 VITALS — BP 100/60 | HR 53 | Ht 71.0 in | Wt 199.5 lb

## 2018-01-15 DIAGNOSIS — Z0000 Encounter for general adult medical examination without abnormal findings: Secondary | ICD-10-CM

## 2018-01-15 NOTE — Progress Notes (Addendum)
Subjective:   [name] is a 72 y.o. male who presents for Medicare Annual/Subsequent preventive examination.  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 Raliegh Ip is case managing Swallowing has evaluated and given him a notebook  Thickener in liquids    Diet Lipids hdl 38' BMI 28 Egg; cheerios;  Takes his medicine before breakfast; thyroid Takes asa;   Exercise You are walking the best you can  Uses walker so he will not fall backwards  Colonoscopy 2017 / 01/2019 will be repeated    There are no preventive care reminders to display for this patient. Colonoscopy 01/2016 due in 2020      Objective:    Vitals: BP 100/60   Pulse (!) 53   Ht 5\' 11"  (1.803 m)   Wt 199 lb 8 oz (90.5 kg)   SpO2 97%   BMI 27.82 kg/m   Body mass index is 27.82 kg/m.  Advanced Directives 01/15/2018 01/14/2017 01/09/2017 12/06/2016 12/06/2016 12/06/2016 10/18/2016  Does Patient Have a Medical Advance Directive? Yes Yes Yes Yes Yes Yes Yes  Type of Advance Directive - [place];Living will [place];Living will [place];Living will [place];Living will [place];Living will -  Does patient want to make changes to medical advance directive? - - - - - - -  Copy of [place] in Chart? - - - - - - -    Tobacco Social History   Tobacco Use  Smoking Status Never Smoker  Smokeless Tobacco Never Used     Counseling given: Yes   Clinical Intake:     Past Medical History:  Diagnosis Date  . Allergic rhinitis   . Allergy   . Arthritis   . At risk for falls   . Cancer ([place])    basal cell skin cancer  . Cataract   . Complication of anesthesia    Slow to awaken  . Constipation   . DDD (degenerative disc disease), cervical   . Dysphagia   . Edema extremities 01/13/2016  . GERD  (gastroesophageal reflux disease)    Decdreased, has decreased coffee intack.  . Glaucoma   . HTN (hypertension)   . Hx of adenomatous colonic polyps 01/23/2016  . Hyperlipidemia   . Hypogonadism male   . Hypothyroidism   . OSA (obstructive sleep apnea)    Has had surgery x 2  . Parkinsonism ([place]) 01/16/2013   no tremors, balance issues only , speech and swallowing difficulties   . Pre-diabetes   . Sleep apnea    surgery x 2- no cpap  . Staph infection 10-2014  . Thyroid disease    HYPOTHYROID   Past Surgical History:  Procedure Laterality Date  . CATARACT EXTRACTION    . COLONOSCOPY  04-27-2004   tics, hems   . I&D EXTREMITY Right 10/18/2014   Procedure: IRRIGATION AND DEBRIDEMENT RIGHT KNEE PREPATELLA BURSA INFECTION;  Surgeon: [name], MD;  Location: [facility];  Service: Orthopedics;  Laterality: Right;  . [place] SURGERY  04-2015  . LUMBAR LAMINECTOMY WITH COFLEX 1 LEVEL N/A 06/06/2015   Procedure: Lumbar three-four Laminectomy/Foraminotomy with placement of Coflex;  Surgeon: [name], MD;  Location: [facility];  Service: Neurosurgery;  Laterality: N/A;  . NASAL SEPTUM SURGERY    . TONSILLECTOMY  and adenoids  . UVULOPALATOPHARYNGOPLASTY     Family History  Problem Relation Age of Onset  . Emphysema Mother   . COPD Mother   . Heart disease Mother   . Hypertension Mother   . Liver cancer Father   . Cancer Father   . Diabetes Father   . Hypertension Father   . Colon polyps Father   . Heart disease Sister   . COPD Sister   . Heart attack Sister   . Hypertension Sister   . Colon polyps Brother   . Stroke Neg Hx   . Colon cancer Neg Hx    Social History   Socioeconomic History  . Marital status: Married    Spouse name: linda  . Number of children: 2  . Years of education: college  . Highest education level: Not on file  Occupational History  . Occupation: branch Scientist, product/process development: [employer]  . Financial resource strain:  Not on file  . Food insecurity:    Worry: Not on file    Inability: Not on file  . Transportation needs:    Medical: Not on file    Non-medical: Not on file  Tobacco Use  . Smoking status: Never Smoker  . Smokeless tobacco: Never Used  Substance and Sexual Activity  . Alcohol use: No    Alcohol/week: 0.0 oz  . Drug use: No  . Sexual activity: Not on file  Lifestyle  . Physical activity:    Days per week: Not on file    Minutes per session: Not on file  . Stress: Not on file  Relationships  . Social connections:    Talks on phone: Not on file    Gets together: Not on file    Attends religious service: Not on file    Active member of club or organization: Not on file    Attends meetings of clubs or organizations: Not on file    Relationship status: Not on file  Other Topics Concern  . Not on file  Social History Narrative  . Not on file    Outpatient Encounter Medications as of 01/15/2018  Medication Sig  . aspirin 81 MG tablet Take 81 mg by mouth daily.  Marland Kitchen BYSTOLIC 2.5 MG tablet TAKE 1 TABLET(2.5 MG) BY MOUTH DAILY  . Cholecalciferol (VITAMIN D) 2000 units CAPS Take by mouth once a week.  . finasteride (PROSCAR) 5 MG tablet Take 5 mg by mouth daily.  . fluticasone (FLONASE) 50 MCG/ACT nasal spray USE 1-2 SPRAYS IN EACH NOSTRIL DAILY  . levothyroxine (SYNTHROID, LEVOTHROID) 50 MCG tablet TAKE 1 TABLET(50 MCG) BY MOUTH DAILY  . loratadine (CLARITIN) 10 MG tablet Take 10 mg by mouth daily.  Marland Kitchen losartan (COZAAR) 100 MG tablet TAKE 1 TABLET BY MOUTH EVERY DAY  . Multiple Vitamins-Minerals (MULTIVITAMIN WITH MINERALS) tablet Take 1 tablet by mouth daily.    . Naproxen Sodium (ALEVE) 220 MG CAPS Take by mouth.  . timolol (BETIMOL) 0.5 % ophthalmic solution 1 drop 2 (two) times daily.   No facility-administered encounter medications on file as of 01/15/2018.     Activities of Daily Living In your present state of health, do you have any difficulty performing the following  activities: 01/15/2018  Hearing? N  Vision? N  Difficulty concentrating or making decisions? N  Walking or climbing stairs? Y  Dressing or bathing? Y  Doing errands, shopping? Y  Preparing Food and eating ? N  Using the Toilet?  N  In the past six months, have you accidently leaked urine? N  Do you have problems with loss of bowel control? N  Managing your Medications? Y  Managing your Finances? Y  Housekeeping or managing your Housekeeping? N  Some recent data might be hidden    Patient Care Team: [name], MD as PCP - General (Internal Medicine)   Assessment:   This is a routine wellness examination for Naveen.  Exercise Activities and Dietary recommendations Current Exercise Habits: Home exercise routine, Type of exercise: walking, Intensity: Mild  Goals    . Patient Stated     Find a magic pill and keep pushing until they do!       Fall Risk Fall Risk  01/15/2018 10/18/2016 04/30/2016 10/31/2015 09/30/2015  Falls in the past year? Yes Yes Yes Yes Yes  Number falls in past yr: - 2 or more 2 or more 2 or more 2 or more  Injury with Fall? - - No Yes Yes  Risk Factor Category  - - High Fall Risk High Fall Risk -  Risk for fall due to : Impaired balance/gait;Impaired mobility - - Impaired balance/gait;Impaired mobility Impaired balance/gait  Risk for fall due to: Comment 10 times a day but since the walker but now he is better  - - - -  Follow up - Education provided Falls prevention discussed Falls prevention discussed -   Has a team of providers working with him. He is using walker as directed with much better result. Wife is full time caregiver  Bathroom handicapped accessible    Depression Screen PHQ 2/9 Scores 01/15/2018 10/18/2016 09/30/2015 09/28/2014  PHQ - 2 Score 0 0 0 0   Denies depression  Cognitive Function MMSE - Mini Mental State Exam 01/15/2018 11/19/2016 04/30/2016 10/31/2015 11/18/2014  Not completed: (No Data) - - - -  Orientation to time - 5  5 5 5   Orientation to Place - 5 5 5 5   Registration - 3 3 3 3   Attention/ Calculation - 5 3 5 5   Recall - 3 3 3 3   Language- name 2 objects - 2 2 2 2   Language- repeat - 1 1 1 1   Language- follow 3 step command - 3 3 3 3   Language- read & follow direction - 1 1 1 1   Write a sentence - 1 1 1 1   Copy design - 1 1 1 1   Total score - 30 28 30 30       Waived today as it was just checked 11/19/16; speech difficult to understand but answers appropriately   Immunization History  Administered Date(s) Administered  . Influenza Whole 08/29/2010, 07/09/2011, 07/21/2012  . Influenza, High Dose Seasonal PF 07/24/2013, 07/15/2015, 06/23/2016  . Influenza-Unspecified 07/23/2014, 07/04/2017  . Pneumococcal Conjugate-13 05/13/2014  . Pneumococcal Polysaccharide-23 10/08/2002, 08/12/2015  . Tdap 07/08/2010  . Zoster 07/09/2007      Screening Tests Health Maintenance  Topic Date Due  . HEMOGLOBIN A1C  01/15/2018 (Originally 05/08/2017)  . FOOT EXAM  02/05/2019 (Originally 02/26/1956)  . INFLUENZA VACCINE  05/08/2018  . OPHTHALMOLOGY EXAM  07/08/2018  . COLONOSCOPY  01/16/2019  . TETANUS/TDAP  07/08/2020  . Hepatitis C Screening  Completed  . PNA vac Low Risk Adult  Completed         Plan:      PCP Notes   Health Maintenance Educated regarding shingrix Declined foot exam; checked by neuro and other providers  Eye exam was around October of last  year;  To have another in 2 weeks and will have Dr. Kathrin Penner send the results   Abnormal Screens  none  Referrals  none  Patient concerns; None; drinking with thickener; wife 24/7 care with some relief from dtr.   Nurse Concerns; Great coordination of care for very pleasant gentleman. Balance is an issue but well managed with belt and walker.  Falls have ceased for the time being.    Next PCP apt Apt with Dr. Raliegh Ip to be made today for medical fup and labs May 14th. VA benefits but does not use these. Understands home care  benefit but the wife does not feel she needs assistance at present.      I have personally reviewed and noted the following in the patient's chart:   . Medical and social history . Use of alcohol, tobacco or illicit drugs  . Current medications and supplements . Functional ability and status . Nutritional status . Physical activity . Advanced directives . List of other physicians . Hospitalizations, surgeries, and ER visits in previous 12 months . Vitals . Screenings to include cognitive, depression, and falls . Referrals and appointments  In addition, I have reviewed and discussed with patient certain preventive protocols, quality metrics, and best practice recommendations. A written personalized care plan for preventive services as well as general preventive health recommendations were provided to patient.     PYPPJ,KDTOI, RN  01/15/2018  Results of this patient's subsequent Medicare wellness visit reviewed and agree with findings  Nyoka Cowden

## 2018-01-15 NOTE — Patient Instructions (Addendum)
Mr. [name] , Thank you for taking time to come for your Medicare Wellness Visit. I appreciate your ongoing commitment to your health goals. Please review the following plan we discussed and let me know if I can assist you in the future.   Can make an apt with Dr. Raliegh Ip for you annual labs and medical evaluation  Shingrix is a vaccine for the prevention of Shingles in Adults 50 and older.  If you are on Medicare, you can request a prescription from your doctor to be filled at a pharmacy.  Please check with your benefits regarding applicable copays or out of pocket expenses.  The Shingrix is given in 2 vaccines approx 8 weeks apart. You must receive the 2nd dose prior to 6 months from receipt of the first.     These are the goals we discussed: Goals    . Patient Stated     Find a magic pill and keep pushing until they do!       This is a list of the screening recommended for you and due dates:  Health Maintenance  Topic Date Due  . Complete foot exam   02/26/1956  . Eye exam for diabetics  02/26/1956  . Hemoglobin A1C  05/08/2017  . Flu Shot  05/08/2018  . Colon Cancer Screening  01/16/2019  . Tetanus Vaccine  07/08/2020  .  Hepatitis C: One time screening is recommended by Center for Disease Control  (CDC) for  adults born from [birth year] through 1964.   Completed  . Pneumonia vaccines  Completed      Fall Prevention in the Home Falls can cause injuries. They can happen to people of all ages. There are many things you can do to make your home safe and to help prevent falls. What can I do on the outside of my home?  Regularly fix the edges of walkways and driveways and fix any cracks.  Remove anything that might make you trip as you walk through a door, such as a raised step or threshold.  Trim any bushes or trees on the path to your home.  Use bright outdoor lighting.  Clear any walking paths of anything that might make someone trip, such as rocks or tools.  Regularly check to  see if handrails are loose or broken. Make sure that both sides of any steps have handrails.  Any raised decks and porches should have guardrails on the edges.  Have any leaves, snow, or ice cleared regularly.  Use sand or salt on walking paths during winter.  Clean up any spills in your garage right away. This includes oil or grease spills. What can I do in the bathroom?  Use night lights.  Install grab bars by the toilet and in the tub and shower. Do not use towel bars as grab bars.  Use non-skid mats or decals in the tub or shower.  If you need to sit down in the shower, use a plastic, non-slip stool.  Keep the floor dry. Clean up any water that spills on the floor as soon as it happens.  Remove soap buildup in the tub or shower regularly.  Attach bath mats securely with double-sided non-slip rug tape.  Do not have throw rugs and other things on the floor that can make you trip. What can I do in the bedroom?  Use night lights.  Make sure that you have a light by your bed that is easy to reach.  Do not use any sheets  or blankets that are too big for your bed. They should not hang down onto the floor.  Have a firm chair that has side arms. You can use this for support while you get dressed.  Do not have throw rugs and other things on the floor that can make you trip. What can I do in the kitchen?  Clean up any spills right away.  Avoid walking on wet floors.  Keep items that you use a lot in easy-to-reach places.  If you need to reach something above you, use a strong step stool that has a grab bar.  Keep electrical cords out of the way.  Do not use floor polish or wax that makes floors slippery. If you must use wax, use non-skid floor wax.  Do not have throw rugs and other things on the floor that can make you trip. What can I do with my stairs?  Do not leave any items on the stairs.  Make sure that there are handrails on both sides of the stairs and use  them. Fix handrails that are broken or loose. Make sure that handrails are as long as the stairways.  Check any carpeting to make sure that it is firmly attached to the stairs. Fix any carpet that is loose or worn.  Avoid having throw rugs at the top or bottom of the stairs. If you do have throw rugs, attach them to the floor with carpet tape.  Make sure that you have a light switch at the top of the stairs and the bottom of the stairs. If you do not have them, ask someone to add them for you. What else can I do to help prevent falls?  Wear shoes that: ? Do not have high heels. ? Have rubber bottoms. ? Are comfortable and fit you well. ? Are closed at the toe. Do not wear sandals.  If you use a stepladder: ? Make sure that it is fully opened. Do not climb a closed stepladder. ? Make sure that both sides of the stepladder are locked into place. ? Ask someone to hold it for you, if possible.  Clearly mark and make sure that you can see: ? Any grab bars or handrails. ? First and last steps. ? Where the edge of each step is.  Use tools that help you move around (mobility aids) if they are needed. These include: ? Canes. ? Walkers. ? Scooters. ? Crutches.  Turn on the lights when you go into a dark area. Replace any light bulbs as soon as they burn out.  Set up your furniture so you have a clear path. Avoid moving your furniture around.  If any of your floors are uneven, fix them.  If there are any pets around you, be aware of where they are.  Review your medicines with your doctor. Some medicines can make you feel dizzy. This can increase your chance of falling. Ask your doctor what other things that you can do to help prevent falls. This information is not intended to replace advice given to you by your health care provider. Make sure you discuss any questions you have with your health care provider. Document Released: 07/21/2009 Document Revised: 03/01/2016 Document Reviewed:  10/29/2014 Elsevier Interactive Patient Education  2018 [place] Maintenance, Male A healthy lifestyle and preventive care is important for your health and wellness. Ask your health care provider about what schedule of regular examinations is right for you. What should I know  about weight and diet? Eat a Healthy Diet  Eat plenty of vegetables, fruits, whole grains, low-fat dairy products, and lean protein.  Do not eat a lot of foods high in solid fats, added sugars, or salt.  Maintain a Healthy Weight Regular exercise can help you achieve or maintain a healthy weight. You should:  Do at least 150 minutes of exercise each week. The exercise should increase your heart rate and make you sweat (moderate-intensity exercise).  Do strength-training exercises at least twice a week.  Watch Your Levels of Cholesterol and Blood Lipids  Have your blood tested for lipids and cholesterol every 5 years starting at 71 years of age. If you are at high risk for heart disease, you should start having your blood tested when you are 71 years old. You may need to have your cholesterol levels checked more often if: ? Your lipid or cholesterol levels are high. ? You are older than 71 years of age. ? You are at high risk for heart disease.  What should I know about cancer screening? Many types of cancers can be detected early and may often be prevented. Lung Cancer  You should be screened every year for lung cancer if: ? You are a current smoker who has smoked for at least 30 years. ? You are a former smoker who has quit within the past 15 years.  Talk to your health care provider about your screening options, when you should start screening, and how often you should be screened.  Colorectal Cancer  Routine colorectal cancer screening usually begins at 71 years of age and should be repeated every 5-10 years until you are 71 years old. You may need to be screened more often if early forms  of precancerous polyps or small growths are found. Your health care provider may recommend screening at an earlier age if you have risk factors for colon cancer.  Your health care provider may recommend using home test kits to check for hidden blood in the stool.  A small camera at the end of a tube can be used to examine your colon (sigmoidoscopy or colonoscopy). This checks for the earliest forms of colorectal cancer.  Prostate and Testicular Cancer  Depending on your age and overall health, your health care provider may do certain tests to screen for prostate and testicular cancer.  Talk to your health care provider about any symptoms or concerns you have about testicular or prostate cancer.  Skin Cancer  Check your skin from head to toe regularly.  Tell your health care provider about any new moles or changes in moles, especially if: ? There is a change in a mole's size, shape, or color. ? You have a mole that is larger than a pencil eraser.  Always use sunscreen. Apply sunscreen liberally and repeat throughout the day.  Protect yourself by wearing long sleeves, pants, a wide-brimmed hat, and sunglasses when outside.  What should I know about heart disease, diabetes, and high blood pressure?  If you are 58-71 years of age, have your blood pressure checked every 3-5 years. If you are 52 years of age or older, have your blood pressure checked every year. You should have your blood pressure measured twice-once when you are at a hospital or clinic, and once when you are not at a hospital or clinic. Record the average of the two measurements. To check your blood pressure when you are not at a hospital or clinic, you can use: ? An automated  blood pressure machine at a pharmacy. ? A home blood pressure monitor.  Talk to your health care provider about your target blood pressure.  If you are between 76-53 years old, ask your health care provider if you should take aspirin to prevent heart  disease.  Have regular diabetes screenings by checking your fasting blood sugar level. ? If you are at a normal weight and have a low risk for diabetes, have this test once every three years after the age of 64. ? If you are overweight and have a high risk for diabetes, consider being tested at a younger age or more often.  A one-time screening for abdominal aortic aneurysm (AAA) by ultrasound is recommended for men aged 66-75 years who are current or former smokers. What should I know about preventing infection? Hepatitis B If you have a higher risk for hepatitis B, you should be screened for this virus. Talk with your health care provider to find out if you are at risk for hepatitis B infection. Hepatitis C Blood testing is recommended for:  Everyone born from [birth year] through 1964.  Anyone with known risk factors for hepatitis C.  Sexually Transmitted Diseases (STDs)  You should be screened each year for STDs including gonorrhea and chlamydia if: ? You are sexually active and are younger than 71 years of age. ? You are older than 71 years of age and your health care provider tells you that you are at risk for this type of infection. ? Your sexual activity has changed since you were last screened and you are at an increased risk for chlamydia or gonorrhea. Ask your health care provider if you are at risk.  Talk with your health care provider about whether you are at high risk of being infected with HIV. Your health care provider may recommend a prescription medicine to help prevent HIV infection.  What else can I do?  Schedule regular health, dental, and eye exams.  Stay current with your vaccines (immunizations).  Do not use any tobacco products, such as cigarettes, chewing tobacco, and e-cigarettes. If you need help quitting, ask your health care provider.  Limit alcohol intake to no more than 2 drinks per day. One drink equals 12 ounces of beer, 5 ounces of wine, or 1 ounces of  hard liquor.  Do not use street drugs.  Do not share needles.  Ask your health care provider for help if you need support or information about quitting drugs.  Tell your health care provider if you often feel depressed.  Tell your health care provider if you have ever been abused or do not feel safe at home. This information is not intended to replace advice given to you by your health care provider. Make sure you discuss any questions you have with your health care provider. Document Released: 03/22/2008 Document Revised: 05/23/2016 Document Reviewed: 06/28/2015 Elsevier Interactive Patient Education  [employer].

## 2018-01-15 NOTE — Progress Notes (Signed)
   Subjective:   [name] is a 72 y.o. male who presents for Medicare Annual/S  IGNORE NOTE_ THIS IS AN ERROR

## 2018-01-20 ENCOUNTER — Other Ambulatory Visit: Payer: Self-pay | Admitting: Internal Medicine

## 2018-01-21 ENCOUNTER — Other Ambulatory Visit: Payer: Self-pay | Admitting: Internal Medicine

## 2018-01-23 DIAGNOSIS — H1789 Other corneal scars and opacities: Secondary | ICD-10-CM | POA: Diagnosis not present

## 2018-01-23 DIAGNOSIS — Z961 Presence of intraocular lens: Secondary | ICD-10-CM | POA: Diagnosis not present

## 2018-01-23 DIAGNOSIS — H401132 Primary open-angle glaucoma, bilateral, moderate stage: Secondary | ICD-10-CM | POA: Diagnosis not present

## 2018-01-23 DIAGNOSIS — G20 Parkinson's disease: Secondary | ICD-10-CM | POA: Diagnosis not present

## 2018-02-11 DIAGNOSIS — L821 Other seborrheic keratosis: Secondary | ICD-10-CM | POA: Diagnosis not present

## 2018-02-11 DIAGNOSIS — D2261 Melanocytic nevi of right upper limb, including shoulder: Secondary | ICD-10-CM | POA: Diagnosis not present

## 2018-02-11 DIAGNOSIS — D225 Melanocytic nevi of trunk: Secondary | ICD-10-CM | POA: Diagnosis not present

## 2018-02-11 DIAGNOSIS — Z85828 Personal history of other malignant neoplasm of skin: Secondary | ICD-10-CM | POA: Diagnosis not present

## 2018-02-18 ENCOUNTER — Ambulatory Visit (INDEPENDENT_AMBULATORY_CARE_PROVIDER_SITE_OTHER): Payer: Medicare Other | Admitting: Internal Medicine

## 2018-02-18 ENCOUNTER — Encounter: Payer: Self-pay | Admitting: Internal Medicine

## 2018-02-18 VITALS — BP 120/60 | HR 47 | Temp 98.2°F | Wt 192.0 lb

## 2018-02-18 DIAGNOSIS — E039 Hypothyroidism, unspecified: Secondary | ICD-10-CM

## 2018-02-18 DIAGNOSIS — J302 Other seasonal allergic rhinitis: Secondary | ICD-10-CM | POA: Diagnosis not present

## 2018-02-18 DIAGNOSIS — J3089 Other allergic rhinitis: Secondary | ICD-10-CM | POA: Diagnosis not present

## 2018-02-18 DIAGNOSIS — R972 Elevated prostate specific antigen [PSA]: Secondary | ICD-10-CM | POA: Diagnosis not present

## 2018-02-18 DIAGNOSIS — I10 Essential (primary) hypertension: Secondary | ICD-10-CM

## 2018-02-18 DIAGNOSIS — G231 Progressive supranuclear ophthalmoplegia [Steele-Richardson-Olszewski]: Secondary | ICD-10-CM | POA: Diagnosis not present

## 2018-02-18 DIAGNOSIS — I421 Obstructive hypertrophic cardiomyopathy: Secondary | ICD-10-CM | POA: Diagnosis not present

## 2018-02-18 LAB — COMPREHENSIVE METABOLIC PANEL
ALBUMIN: 4.4 g/dL (ref 3.5–5.2)
ALT: 25 U/L (ref 0–53)
AST: 23 U/L (ref 0–37)
Alkaline Phosphatase: 56 U/L (ref 39–117)
BILIRUBIN TOTAL: 0.6 mg/dL (ref 0.2–1.2)
BUN: 10 mg/dL (ref 6–23)
CALCIUM: 9.3 mg/dL (ref 8.4–10.5)
CHLORIDE: 104 meq/L (ref 96–112)
CO2: 28 meq/L (ref 19–32)
CREATININE: 0.74 mg/dL (ref 0.40–1.50)
GFR: 110.83 mL/min (ref >60.00)
Glucose, Bld: 82 mg/dL (ref 70–99)
Potassium: 4.7 mEq/L (ref 3.5–5.1)
Sodium: 143 mEq/L (ref 135–145)
Total Protein: 7 g/dL (ref 6.0–8.3)

## 2018-02-18 LAB — CBC WITH DIFFERENTIAL/PLATELET
Basophils Absolute: 0 10*3/uL (ref 0.0–0.1)
Basophils Relative: 0.7 % (ref 0.0–3.0)
EOS ABS: 0.5 10*3/uL (ref 0.0–0.7)
Eosinophils Relative: 6.8 % — ABNORMAL HIGH (ref 0.0–5.0)
HEMATOCRIT: 45 % (ref 39.0–52.0)
HEMOGLOBIN: 15.5 g/dL (ref 13.0–17.0)
Lymphocytes Relative: 23 % (ref 12.0–46.0)
Lymphs Abs: 1.6 10*3/uL (ref 0.7–4.0)
MCHC: 34.4 g/dL (ref 30.0–36.0)
MCV: 91.9 fl (ref 78.0–100.0)
MONO ABS: 0.6 10*3/uL (ref 0.1–1.0)
Monocytes Relative: 9.3 % (ref 3.0–12.0)
Neutro Abs: 4.1 10*3/uL (ref 1.4–7.7)
Neutrophils Relative %: 60.2 % (ref 43.0–77.0)
Platelets: 247 10*3/uL (ref 150.0–400.0)
RBC: 4.9 Mil/uL (ref 4.22–5.81)
RDW: 13.3 % (ref 11.5–15.5)
WBC: 6.8 10*3/uL (ref 4.0–10.5)

## 2018-02-18 LAB — LIPID PANEL
CHOL/HDL RATIO: 5
CHOLESTEROL: 174 mg/dL (ref 0–200)
HDL: 37.8 mg/dL — ABNORMAL LOW (ref >39.00)
NonHDL: 136.02
TRIGLYCERIDES: 235 mg/dL — AB (ref 0.0–149.0)
VLDL: 47 mg/dL — AB (ref 0.0–40.0)

## 2018-02-18 LAB — LDL CHOLESTEROL, DIRECT: LDL DIRECT: 107 mg/dL

## 2018-02-18 LAB — PSA: PSA: 1 ng/mL (ref 0.10–4.00)

## 2018-02-18 LAB — TSH: TSH: 3.81 u[IU]/mL (ref 0.35–4.50)

## 2018-02-18 NOTE — Patient Instructions (Signed)
Limit your sodium (Salt) intake  Please check your blood pressure on a regular basis.  If it is consistently greater than 150/90, please make an office appointment.   neurology follow-up as scheduled  Return in 6 months for follow-up

## 2018-02-18 NOTE — Progress Notes (Signed)
Subjective:    Patient ID: [name], male    DOB: 04-10-1946, 72 y.o.   MRN: [id number]  HPI  72 year old patient who is seen today for his annual follow-up. He is followed closely by neurology with a history of PD/PSP.  He has remained remarkably stable over the past year.  He is followed by neurology both locally and at [facility].  No recent lab He does have a history of allergic rhinitis and is having some allergy issues otherwise is doing remarkably well.  He continues to have issues with constipation that are fairly well controlled with as needed MiraLAX He has essential hypertension that has been well controlled.  He does have a history of lumbar stenosis but no complaints of back pain today. He has hypothyroidism.  Past Medical History:  Diagnosis Date  . Allergic rhinitis   . Allergy   . Arthritis   . At risk for falls   . Cancer ([place])    basal cell skin cancer  . Cataract   . Complication of anesthesia    Slow to awaken  . Constipation   . DDD (degenerative disc disease), cervical   . Dysphagia   . Edema extremities 01/13/2016  . GERD (gastroesophageal reflux disease)    Decdreased, has decreased coffee intack.  . Glaucoma   . HTN (hypertension)   . Hx of adenomatous colonic polyps 01/23/2016  . Hyperlipidemia   . Hypogonadism male   . Hypothyroidism   . OSA (obstructive sleep apnea)    Has had surgery x 2  . Parkinsonism ([place]) 01/16/2013   no tremors, balance issues only , speech and swallowing difficulties   . Pre-diabetes   . Sleep apnea    surgery x 2- no cpap  . Staph infection 10-2014  . Thyroid disease    HYPOTHYROID     Social History   Socioeconomic History  . Marital status: Married    Spouse name: linda  . Number of children: 2  . Years of education: college  . Highest education level: Not on file  Occupational History  . Occupation: branch Scientist, product/process development: [employer]  . Financial resource strain: Not on file  .  Food insecurity:    Worry: Not on file    Inability: Not on file  . Transportation needs:    Medical: Not on file    Non-medical: Not on file  Tobacco Use  . Smoking status: Never Smoker  . Smokeless tobacco: Never Used  Substance and Sexual Activity  . Alcohol use: No    Alcohol/week: 0.0 oz  . Drug use: No  . Sexual activity: Not on file  Lifestyle  . Physical activity:    Days per week: Not on file    Minutes per session: Not on file  . Stress: Not on file  Relationships  . Social connections:    Talks on phone: Not on file    Gets together: Not on file    Attends religious service: Not on file    Active member of club or organization: Not on file    Attends meetings of clubs or organizations: Not on file    Relationship status: Not on file  . Intimate partner violence:    Fear of current or ex partner: Not on file    Emotionally abused: Not on file    Physically abused: Not on file    Forced sexual activity: Not on file  Other Topics Concern  .  Not on file  Social History Narrative  . Not on file    Past Surgical History:  Procedure Laterality Date  . CATARACT EXTRACTION    . COLONOSCOPY  04-27-2004   tics, hems   . I&D EXTREMITY Right 10/18/2014   Procedure: IRRIGATION AND DEBRIDEMENT RIGHT KNEE PREPATELLA BURSA INFECTION;  Surgeon: [name], MD;  Location: [facility];  Service: Orthopedics;  Laterality: Right;  . [place] SURGERY  04-2015  . LUMBAR LAMINECTOMY WITH COFLEX 1 LEVEL N/A 06/06/2015   Procedure: Lumbar three-four Laminectomy/Foraminotomy with placement of Coflex;  Surgeon: [name], MD;  Location: [facility];  Service: Neurosurgery;  Laterality: N/A;  . NASAL SEPTUM SURGERY    . TONSILLECTOMY     and adenoids  . UVULOPALATOPHARYNGOPLASTY      Family History  Problem Relation Age of Onset  . Emphysema Mother   . COPD Mother   . Heart disease Mother   . Hypertension Mother   . Liver cancer Father   . Cancer Father   . Diabetes Father    . Hypertension Father   . Colon polyps Father   . Heart disease Sister   . COPD Sister   . Heart attack Sister   . Hypertension Sister   . Colon polyps Brother   . Stroke Neg Hx   . Colon cancer Neg Hx     Allergies  Allergen Reactions  . Ambien [Zolpidem Tartrate]     Unknown- balance  . Atenolol     unknown  . Lunesta [Eszopiclone]     unknown  . Oxazepam     unknown  . Requip [Ropinirole Hcl]     unknown  . Ziac [Bisoprolol-Hydrochlorothiazide]     unknown    Current Outpatient Medications on File Prior to Visit  Medication Sig Dispense Refill  . aspirin 81 MG tablet Take 81 mg by mouth daily.    Marland Kitchen BYSTOLIC 2.5 MG tablet TAKE 1 TABLET(2.5 MG) BY MOUTH DAILY 90 tablet 3  . Cholecalciferol (VITAMIN D) 2000 units CAPS Take by mouth once a week.    . finasteride (PROSCAR) 5 MG tablet Take 5 mg by mouth daily.  11  . fluticasone (FLONASE) 50 MCG/ACT nasal spray USE 1-2 SPRAYS IN EACH NOSTRIL DAILY 16 g 6  . levothyroxine (SYNTHROID, LEVOTHROID) 50 MCG tablet TAKE 1 TABLET(50 MCG) BY MOUTH DAILY 90 tablet 0  . loratadine (CLARITIN) 10 MG tablet Take 10 mg by mouth daily.    Marland Kitchen losartan (COZAAR) 100 MG tablet TAKE 1 TABLET BY MOUTH EVERY DAY 30 tablet 5  . Multiple Vitamins-Minerals (MULTIVITAMIN WITH MINERALS) tablet Take 1 tablet by mouth daily.      . Naproxen Sodium (ALEVE) 220 MG CAPS Take by mouth.    . timolol (BETIMOL) 0.5 % ophthalmic solution 1 drop 2 (two) times daily.    . timolol (TIMOPTIC) 0.5 % ophthalmic solution      No current facility-administered medications on file prior to visit.     BP 120/60 (BP Location: Right Arm, Patient Position: Sitting, Cuff Size: Large)   Pulse (!) 47   Temp 98.2 F (36.8 C) (Oral)   Wt 192 lb (87.1 kg)   SpO2 95%   BMI 26.78 kg/m     Review of Systems  Constitutional: Positive for fatigue. Negative for appetite change, chills and fever.  HENT: Negative for congestion, dental problem, ear pain, hearing loss, sore  throat, tinnitus, trouble swallowing and voice change.   Eyes: Negative  for pain, discharge and visual disturbance.  Respiratory: Negative for cough, chest tightness, wheezing and stridor.   Cardiovascular: Negative for chest pain, palpitations and leg swelling.  Gastrointestinal: Negative for abdominal distention, abdominal pain, blood in stool, constipation, diarrhea, nausea and vomiting.  Genitourinary: Negative for difficulty urinating, discharge, flank pain, genital sores, hematuria and urgency.  Musculoskeletal: Positive for gait problem. Negative for arthralgias, back pain, joint swelling, myalgias and neck stiffness.  Skin: Negative for rash.  Neurological: Negative for dizziness, syncope, speech difficulty, weakness, numbness and headaches.  Hematological: Negative for adenopathy. Does not bruise/bleed easily.  Psychiatric/Behavioral: Negative for behavioral problems and dysphoric mood. The patient is nervous/anxious.        Objective:   Physical Exam  Constitutional: He is oriented to person, place, and time. He appears well-developed.     Blood pressure low normal weight 192 ambulatory with a walker  HENT:  Head: Normocephalic.  Right Ear: External ear normal.  Left Ear: External ear normal.  Eyes: Conjunctivae and EOM are normal.  Neck: Normal range of motion.  Cardiovascular: Normal rate and normal heart sounds.  Pulmonary/Chest: Breath sounds normal.  Abdominal: Bowel sounds are normal.  Musculoskeletal: Normal range of motion. He exhibits no edema or tenderness.  Support hose in place.  No edema  Neurological: He is alert and oriented to person, place, and time.  Parkinsonian facies with bradykinesia Unsteady gait  Psychiatric: He has a normal mood and affect. His behavior is normal.          Assessment & Plan:  PD/PSP-stable.  Follow-up neurology Essential hypertension well-controlled.  Will check updated lab Hypothyroidism.  Check TSH  Health  maintenance.  Patient has a family history of prostate cancer with his father affected he is requesting a PSA.  Benefits and risk of PSA testing discussed.  He wishes to proceed Allergic rhinitis.  Continue fluticasone and Allegra  Follow-up 6 months with flu vaccine Medications updated  Nyoka Cowden

## 2018-03-17 ENCOUNTER — Other Ambulatory Visit: Payer: Self-pay | Admitting: Cardiology

## 2018-03-19 ENCOUNTER — Other Ambulatory Visit: Payer: Self-pay | Admitting: Cardiology

## 2018-03-19 MED ORDER — NEBIVOLOL HCL 2.5 MG PO TABS: 2.5000 mg | ORAL_TABLET | Freq: Every day | ORAL | 1 refills | Status: DC

## 2018-03-19 NOTE — Telephone Encounter (Signed)
Pt's medication was sent to pt's pharmacy as requested. Confirmation received.  °

## 2018-03-19 NOTE — Telephone Encounter (Signed)
New Message   *STAT* If patient is at the pharmacy, call can be transferred to refill team.   1. Which medications need to be refilled? (please list name of each medication and dose if known) nebivolol (BYSTOLIC) 2.5 MG tablet  2. Which pharmacy/location (including street and city if local pharmacy) is medication to be sent to? [id number] - [place], [place] - [address]  3. Do they need a 30 day or 90 day supply? 73 Pt's wife states he just needs enough to make to his appt on 7/31 because the pharmacy only gave him a qty of 15 pills. Please call

## 2018-04-01 ENCOUNTER — Other Ambulatory Visit: Payer: Self-pay | Admitting: Cardiology

## 2018-04-20 ENCOUNTER — Other Ambulatory Visit: Payer: Self-pay | Admitting: Internal Medicine

## 2018-05-05 NOTE — Progress Notes (Signed)
Cardiology Office Note    Date:  05/06/2018   ID:  [name], DOB 14-Sep-1946, MRN [id number]  PCP:  [name], MD  Cardiologist: [name], MD  Chief Complaint  Patient presents with  . Follow-up    History of Present Illness:  [name] is a 72 y.o. male with history of asymmetric septal hypertrophy consistent with HOCM with no outflow tract gradient, hypertension, OSA, HLD, prediabetes, Parkinson's disease, hypothyroidism.  Patient last saw Dr. [name] 02/2017 which time he was doing well other than his chronic shortness of breath when he bends over and mild lower extremity edema.  2D echo 03/25/2017 normal LVEF with moderate LVH and increased thickness of the septum, grade 2 DD but compared to prior study the IVS wall thickness is decreased and there was no evidence of LVOT obstruction or SAM.  Patient comes in today for yearly follow-up accompanied by his wife.  He walks with a walker and has very difficult to understand speech.  He is focused on one thing during the office visit.  He has had increased fatigue but he does not do a lot.  Says he may get short of breath when he first lays down but thinks it is related to allergies.  No significant dyspnea on exertion, chest pain, edema.  He does complain of leg cramps at night but is followed at [facility] and was told to massage them and get up and move around more often.    Past Medical History:  Diagnosis Date  . Allergic rhinitis   . Allergy   . Arthritis   . At risk for falls   . Cancer ([place])    basal cell skin cancer  . Cataract   . Complication of anesthesia    Slow to awaken  . Constipation   . DDD (degenerative disc disease), cervical   . Dysphagia   . Edema extremities 01/13/2016  . GERD (gastroesophageal reflux disease)    Decdreased, has decreased coffee intack.  . Glaucoma   . HTN (hypertension)   . Hx of adenomatous colonic polyps 01/23/2016  . Hyperlipidemia   . Hypogonadism male   .  Hypothyroidism   . OSA (obstructive sleep apnea)    Has had surgery x 2  . Parkinsonism ([place]) 01/16/2013   no tremors, balance issues only , speech and swallowing difficulties   . Pre-diabetes   . Sleep apnea    surgery x 2- no cpap  . Staph infection 10-2014  . Thyroid disease    HYPOTHYROID    Past Surgical History:  Procedure Laterality Date  . CATARACT EXTRACTION    . COLONOSCOPY  04-27-2004   tics, hems   . I&D EXTREMITY Right 10/18/2014   Procedure: IRRIGATION AND DEBRIDEMENT RIGHT KNEE PREPATELLA BURSA INFECTION;  Surgeon: [name], MD;  Location: [facility];  Service: Orthopedics;  Laterality: Right;  . [place] SURGERY  04-2015  . LUMBAR LAMINECTOMY WITH COFLEX 1 LEVEL N/A 06/06/2015   Procedure: Lumbar three-four Laminectomy/Foraminotomy with placement of Coflex;  Surgeon: [name], MD;  Location: [facility];  Service: Neurosurgery;  Laterality: N/A;  . NASAL SEPTUM SURGERY    . TONSILLECTOMY     and adenoids  . UVULOPALATOPHARYNGOPLASTY      Current Medications: Current Meds  Medication Sig  . aspirin 81 MG tablet Take 81 mg by mouth daily.  . Cholecalciferol (VITAMIN D) 2000 units CAPS Take 1 capsule by mouth once a week.   . finasteride (PROSCAR)  5 MG tablet Take 5 mg by mouth daily.  . fluticasone (FLONASE) 50 MCG/ACT nasal spray USE 1-2 SPRAYS IN EACH NOSTRIL DAILY  . levothyroxine (SYNTHROID, LEVOTHROID) 50 MCG tablet TAKE 1 TABLET(50 MCG) BY MOUTH DAILY  . loratadine (CLARITIN) 10 MG tablet Take 10 mg by mouth daily.  Marland Kitchen losartan (COZAAR) 100 MG tablet TAKE 1 TABLET BY MOUTH EVERY DAY  . Multiple Vitamins-Minerals (MULTIVITAMIN WITH MINERALS) tablet Take 1 tablet by mouth daily.    . Naproxen Sodium (ALEVE) 220 MG CAPS Take by mouth.  . nebivolol (BYSTOLIC) 2.5 MG tablet Take 1 tablet (2.5 mg total) by mouth daily. Please keep upcoming appt for future refills. Thank you  . timolol (BETIMOL) 0.5 % ophthalmic solution Place 1 drop into both eyes 2 (two)  times daily.      Allergies:   Ambien [zolpidem tartrate]; Atenolol; Lunesta [eszopiclone]; Oxazepam; Requip [ropinirole hcl]; and Ziac [bisoprolol-hydrochlorothiazide]   Social History   Socioeconomic History  . Marital status: Married    Spouse name: linda  . Number of children: 2  . Years of education: college  . Highest education level: Not on file  Occupational History  . Occupation: branch Scientist, product/process development: [employer]  . Financial resource strain: Not on file  . Food insecurity:    Worry: Not on file    Inability: Not on file  . Transportation needs:    Medical: Not on file    Non-medical: Not on file  Tobacco Use  . Smoking status: Never Smoker  . Smokeless tobacco: Never Used  Substance and Sexual Activity  . Alcohol use: No    Alcohol/week: 0.0 oz  . Drug use: No  . Sexual activity: Not on file  Lifestyle  . Physical activity:    Days per week: Not on file    Minutes per session: Not on file  . Stress: Not on file  Relationships  . Social connections:    Talks on phone: Not on file    Gets together: Not on file    Attends religious service: Not on file    Active member of club or organization: Not on file    Attends meetings of clubs or organizations: Not on file    Relationship status: Not on file  Other Topics Concern  . Not on file  Social History Narrative  . Not on file     Family History:  The patient's family history includes COPD in his mother and sister; Cancer in his father; Colon polyps in his brother and father; Diabetes in his father; Emphysema in his mother; Heart attack in his sister; Heart disease in his mother and sister; Hypertension in his father, mother, and sister; Liver cancer in his father.   ROS:   Please see the history of present illness.    Review of Systems  Constitution: Negative.  HENT: Negative.   Cardiovascular: Negative.   Respiratory: Positive for snoring.   Endocrine: Negative.     Hematologic/Lymphatic: Negative.   Musculoskeletal: Negative.   Gastrointestinal: Positive for constipation.  Genitourinary: Positive for hesitancy.  Neurological: Positive for loss of balance.   All other systems reviewed and are negative.   PHYSICAL EXAM:   VS:  BP 118/64   Pulse (!) 45   Ht 5\' 10"  (1.778 m)   Wt 198 lb (89.8 kg)   BMI 28.41 kg/m   Physical Exam  GEN: Well nourished, well developed, in no acute distress  Neck: no  JVD, carotid bruits, or masses Cardiac:RRR; positive S4, no significant murmur Respiratory:  clear to auscultation bilaterally, normal work of breathing GI: soft, nontender, nondistended, + BS Ext: without cyanosis, clubbing, or edema, Good distal pulses bilaterally Neuro:  Alert and Oriented x 3 Psych: euthymic mood, full affect  Wt Readings from Last 3 Encounters:  05/06/18 198 lb (89.8 kg)  02/18/18 192 lb (87.1 kg)  01/15/18 199 lb 8 oz (90.5 kg)      Studies/Labs Reviewed:   EKG:  EKG is ordered today.  The ekg ordered today demonstrates sinus bradycardia at 45 bpm, no acute change other than slower heart rate  Recent Labs: 02/18/2018: ALT 25; BUN 10; Creatinine, Ser 0.74; Hemoglobin 15.5; Platelets 247.0; Potassium 4.7; Sodium 143; TSH 3.81   Lipid Panel    Component Value Date/Time   CHOL 174 02/18/2018 1100   TRIG 235.0 (H) 02/18/2018 1100   HDL 37.80 (L) 02/18/2018 1100   CHOLHDL 5 02/18/2018 1100   VLDL 47.0 (H) 02/18/2018 1100   LDLCALC 108 (H) 09/30/2015 1117   LDLDIRECT 107.0 02/18/2018 1100    Additional studies/ records that were reviewed today include:  2D echo 03/25/2017 Study Conclusions   - Left ventricle: The cavity size was normal. There was moderate   focal basal hypertrophy. Systolic function was normal. The   estimated ejection fraction was in the range of 60% to 65%. Wall   motion was normal; there were no regional wall motion   abnormalities. Features are consistent with a pseudonormal left   ventricular  filling pattern, with concomitant abnormal relaxation   and increased filling pressure (grade 2 diastolic dysfunction). - Atrial septum: There was increased thickness of the septum,   consistent with lipomatous hypertrophy. - Tricuspid valve: There was trivial regurgitation    ASSESSMENT:    1. HOCM (hypertrophic obstructive cardiomyopathy) ([place])   2. Essential hypertension   3. Mixed hyperlipidemia      PLAN:  In order of problems listed above:  HOCM with 2D echo 1 year ago showing normal LV function and decrease IVS wall thickness and no evidence of LVOT obstruction or SAM-see above for details-no change in symptoms.  He is on low-dose Bystolic but heart rate is 45 today.  Will discuss with Dr. [name] whether or not to continue this.  Patient may have increased fatigue but with his Parkinson's is difficult to assess.  Essential hypertension blood pressure normal  Mixed hyperlipidemia LDL 107 triglycerides 235.  Patient refuses to change his diet or take a statin.    Medication Adjustments/Labs and Tests Ordered: Current medicines are reviewed at length with the patient today.  Concerns regarding medicines are outlined above.  Medication changes, Labs and Tests ordered today are listed in the Patient Instructions below. Patient Instructions  Medication Instructions: Your physician recommends that you continue on your current medications as directed. Please refer to the Current Medication list given to you today.   Labwork: None Ordered  Procedures/Testing: None Ordered  Follow-Up: Your physician wants you to follow-up in: 1 year with Dr. Mallie Snooks will receive a reminder letter in the mail two months in advance. If you don't receive a letter, please call our office to schedule the follow-up appointment.    Any Additional Special Instructions Will Be Listed Below (If Applicable).     If you need a refill on your cardiac medications before your next appointment,  please call your pharmacy.      Sumner Boast, PA-C  05/06/2018 9:57  AM    [facility] [place], [place], [place]  [address] Phone: [phone]; Fax: [phone]

## 2018-05-06 ENCOUNTER — Encounter: Payer: Self-pay | Admitting: Physician Assistant

## 2018-05-06 ENCOUNTER — Telehealth: Payer: Self-pay | Admitting: Physician Assistant

## 2018-05-06 ENCOUNTER — Ambulatory Visit (INDEPENDENT_AMBULATORY_CARE_PROVIDER_SITE_OTHER): Payer: Medicare Other | Admitting: Physician Assistant

## 2018-05-06 VITALS — BP 118/64 | HR 45 | Ht 70.0 in | Wt 198.0 lb

## 2018-05-06 DIAGNOSIS — I10 Essential (primary) hypertension: Secondary | ICD-10-CM

## 2018-05-06 DIAGNOSIS — E782 Mixed hyperlipidemia: Secondary | ICD-10-CM | POA: Diagnosis not present

## 2018-05-06 DIAGNOSIS — I421 Obstructive hypertrophic cardiomyopathy: Secondary | ICD-10-CM

## 2018-05-06 NOTE — Telephone Encounter (Signed)
-----   Message from Sueanne Margarita, MD sent at 05/06/2018  3:00 PM EDT ----- Cannot view EKG but I'm ok with him stopping BB  Traci TUrner ----- Message ----- From: Imogene Burn, PA-C Sent: 05/06/2018  11:00 AM To: Sueanne Margarita, MD  Can you review this patient's chart and EKG?  His heart rate is 45 today.  He has significant Parkinson's so is difficult to tell if he is symptomatic or not.  He is on low-dose Bystolic 2.5 mg daily.  Let me know if you want to change anything and I will call him.  Thanks [employer]

## 2018-05-06 NOTE — Patient Instructions (Addendum)
Medication Instructions: Your physician recommends that you continue on your current medications as directed. Please refer to the Current Medication list given to you today.   Labwork: None Ordered  Procedures/Testing: None Ordered  Follow-Up: Your physician wants you to follow-up in: 1 year with Dr. Mallie Snooks will receive a reminder letter in the mail two months in advance. If you don't receive a letter, please call our office to schedule the follow-up appointment.    Any Additional Special Instructions Will Be Listed Below (If Applicable).   Heart-Healthy Eating Plan Heart-healthy meal planning includes:  Limiting unhealthy fats.  Increasing healthy fats.  Making other small dietary changes.  You may need to talk with your doctor or a diet specialist (dietitian) to create an eating plan that is right for you. What types of fat should I choose?  Choose healthy fats. These include olive oil and canola oil, flaxseeds, walnuts, almonds, and seeds.  Eat more omega-3 fats. These include salmon, mackerel, sardines, tuna, flaxseed oil, and ground flaxseeds. Try to eat fish at least twice each week.  Limit saturated fats. ? Saturated fats are often found in animal products, such as meats, butter, and cream. ? Plant sources of saturated fats include palm oil, palm kernel oil, and coconut oil.  Avoid foods with partially hydrogenated oils in them. These include stick margarine, some tub margarines, cookies, crackers, and other baked goods. These contain trans fats. What general guidelines do I need to follow?  Check food labels carefully. Identify foods with trans fats or high amounts of saturated fat.  Fill one half of your plate with vegetables and green salads. Eat 4-5 servings of vegetables per day. A serving of vegetables is: ? 1 cup of raw leafy vegetables. ?  cup of raw or cooked cut-up vegetables. ?  cup of vegetable juice.  Fill one fourth of your plate with whole  grains. Look for the word "whole" as the first word in the ingredient list.  Fill one fourth of your plate with lean protein foods.  Eat 4-5 servings of fruit per day. A serving of fruit is: ? One medium whole fruit. ?  cup of dried fruit. ?  cup of fresh, frozen, or canned fruit. ?  cup of 100% fruit juice.  Eat more foods that contain soluble fiber. These include apples, broccoli, carrots, beans, peas, and barley. Try to get 20-30 g of fiber per day.  Eat more home-cooked food. Eat less restaurant, buffet, and fast food.  Limit or avoid alcohol.  Limit foods high in starch and sugar.  Avoid fried foods.  Avoid frying your food. Try baking, boiling, grilling, or broiling it instead. You can also reduce fat by: ? Removing the skin from poultry. ? Removing all visible fats from meats. ? Skimming the fat off of stews, soups, and gravies before serving them. ? Steaming vegetables in water or broth.  Lose weight if you are overweight.  Eat 4-5 servings of nuts, legumes, and seeds per week: ? One serving of dried beans or legumes equals  cup after being cooked. ? One serving of nuts equals 1 ounces. ? One serving of seeds equals  ounce or one tablespoon.  You may need to keep track of how much salt or sodium you eat. This is especially true if you have high blood pressure. Talk with your doctor or dietitian to get more information. What foods can I eat? Grains Breads, including [place], white, pita, wheat, raisin, rye, oatmeal, and [place]. Tortillas  that are neither fried nor made with lard or trans fat. Low-fat rolls, including hotdog and hamburger buns and English muffins. Biscuits. Muffins. Waffles. Pancakes. Light popcorn. Whole-grain cereals. Flatbread. Melba toast. Pretzels. Breadsticks. Rusks. Low-fat snacks. Low-fat crackers, including oyster, saltine, matzo, graham, animal, and rye. Rice and pasta, including brown rice and pastas that are made with whole  wheat. Vegetables All vegetables. Fruits All fruits, but limit coconut. Meats and Other Protein Sources Lean, well-trimmed beef, veal, pork, and lamb. Chicken and turkey without skin. All fish and shellfish. Wild duck, rabbit, pheasant, and venison. Egg whites or low-cholesterol egg substitutes. Dried beans, peas, lentils, and tofu. Seeds and most nuts. Dairy Low-fat or nonfat cheeses, including ricotta, string, and mozzarella. Skim or 1% milk that is liquid, powdered, or evaporated. Buttermilk that is made with low-fat milk. Nonfat or low-fat yogurt. Beverages Mineral water. Diet carbonated beverages. Sweets and Desserts Sherbets and fruit ices. Honey, jam, marmalade, jelly, and syrups. Meringues and gelatins. Pure sugar candy, such as hard candy, jelly beans, gumdrops, mints, marshmallows, and small amounts of dark chocolate. Angel food cake. Eat all sweets and desserts in moderation. Fats and Oils Nonhydrogenated (trans-free) margarines. Vegetable oils, including soybean, sesame, sunflower, olive, peanut, safflower, corn, canola, and cottonseed. Salad dressings or mayonnaise made with a vegetable oil. Limit added fats and oils that you use for cooking, baking, salads, and as spreads. Other Cocoa powder. Coffee and tea. All seasonings and condiments. The items listed above may not be a complete list of recommended foods or beverages. Contact your dietitian for more options. What foods are not recommended? Grains Breads that are made with saturated or trans fats, oils, or whole milk. Croissants. Butter rolls. Cheese breads. Sweet rolls. Donuts. Buttered popcorn. Chow mein noodles. High-fat crackers, such as cheese or butter crackers. Meats and Other Protein Sources Fatty meats, such as hotdogs, short ribs, sausage, spareribs, bacon, rib eye roast or steak, and mutton. High-fat deli meats, such as salami and bologna. Caviar. Domestic duck and goose. Organ meats, such as kidney, liver,  sweetbreads, and heart. Dairy Cream, sour cream, cream cheese, and creamed cottage cheese. Whole-milk cheeses, including blue (bleu), Monterey Jack, Brie, [place], American, Havarti, Swiss, cheddar, Camembert, and [place]. Whole or 2% milk that is liquid, evaporated, or condensed. Whole buttermilk. Cream sauce or high-fat cheese sauce. Yogurt that is made from whole milk. Beverages Regular sodas and juice drinks with added sugar. Sweets and Desserts Frosting. Pudding. Cookies. Cakes other than angel food cake. Candy that has milk chocolate or white chocolate, hydrogenated fat, butter, coconut, or unknown ingredients. Buttered syrups. Full-fat ice cream or ice cream drinks. Fats and Oils Gravy that has suet, meat fat, or shortening. Cocoa butter, hydrogenated oils, palm oil, coconut oil, palm kernel oil. These can often be found in baked products, candy, fried foods, nondairy creamers, and whipped toppings. Solid fats and shortenings, including bacon fat, salt pork, lard, and butter. Nondairy cream substitutes, such as coffee creamers and sour cream substitutes. Salad dressings that are made of unknown oils, cheese, or sour cream. The items listed above may not be a complete list of foods and beverages to avoid. Contact your dietitian for more information. This information is not intended to replace advice given to you by your health care provider. Make sure you discuss any questions you have with your health care provider. Document Released: 03/25/2012 Document Revised: 03/01/2016 Document Reviewed: 03/18/2014 Elsevier Interactive Patient Education  2018 Elsevier Inc.    If you need a refill on   your cardiac medications before your next appointment, please call your pharmacy.

## 2018-05-07 ENCOUNTER — Other Ambulatory Visit: Payer: Self-pay | Admitting: Internal Medicine

## 2018-06-23 ENCOUNTER — Encounter: Payer: Self-pay | Admitting: Internal Medicine

## 2018-06-23 ENCOUNTER — Ambulatory Visit (INDEPENDENT_AMBULATORY_CARE_PROVIDER_SITE_OTHER): Payer: Medicare Other | Admitting: Internal Medicine

## 2018-06-23 VITALS — BP 110/60 | HR 61 | Temp 98.0°F | Wt 195.4 lb

## 2018-06-23 DIAGNOSIS — L03031 Cellulitis of right toe: Secondary | ICD-10-CM

## 2018-06-23 MED ORDER — SULFAMETHOXAZOLE-TRIMETHOPRIM 800-160 MG PO TABS: 1.0000 | ORAL_TABLET | Freq: Two times a day (BID) | ORAL | 0 refills | Status: DC

## 2018-06-23 NOTE — Patient Instructions (Addendum)
Take your antibiotic as prescribed until ALL of it is gone, but stop if you develop a rash, swelling, or any side effects of the medication.  Contact our office as soon as possible if  there are side effects of the medication.   Paronychia Paronychia is an infection of the skin. It happens near a fingernail or toenail. It may cause pain and swelling around the nail. Usually, it is not serious and it clears up with treatment. Follow these instructions at home:  Soak the fingers or toes in warm water as told by your doctor. You may be told to do this for 20 minutes, 2-3 times a day.  Keep the area dry when you are not soaking it.  Take medicines only as told by your doctor.  If you were given an antibiotic medicine, finish all of it even if you start to feel better.  Keep the affected area clean.  Do not try to drain a fluid-filled bump yourself.  Wear rubber gloves when putting your hands in water.  Wear gloves if your hands might touch cleaners or chemicals.  Follow your doctor's instructions about: ? Wound care. ? Bandage (dressing) changes and removal. Contact a doctor if:  Your symptoms get worse or do not improve.  You have a fever or chills.  You have redness spreading from the affected area.  You have more fluid, blood, or pus coming from the affected area.  Your finger or knuckle is swollen or is hard to move. This information is not intended to replace advice given to you by your health care provider. Make sure you discuss any questions you have with your health care provider. Document Released: 09/12/2009 Document Revised: 03/01/2016 Document Reviewed: 09/01/2014 Elsevier Interactive Patient Education  [employer].

## 2018-06-23 NOTE — Progress Notes (Signed)
Subjective:    Patient ID: [name], male    DOB: [birth year], 72 y.o.   MRN: [id number]  HPI  72 year old patient who presents with a 2-day history of a painful right great toe. There has been no obvious trauma.  No constitutional complaints such as fever. He states that he has had ingrown toenails in the past  Past Medical History:  Diagnosis Date  . Allergic rhinitis   . Allergy   . Arthritis   . At risk for falls   . Cancer ([place])    basal cell skin cancer  . Cataract   . Complication of anesthesia    Slow to awaken  . Constipation   . DDD (degenerative disc disease), cervical   . Dysphagia   . Edema extremities 01/13/2016  . GERD (gastroesophageal reflux disease)    Decdreased, has decreased coffee intack.  . Glaucoma   . HTN (hypertension)   . Hx of adenomatous colonic polyps 01/23/2016  . Hyperlipidemia   . Hypogonadism male   . Hypothyroidism   . OSA (obstructive sleep apnea)    Has had surgery x 2  . Parkinsonism ([place]) 01/16/2013   no tremors, balance issues only , speech and swallowing difficulties   . Pre-diabetes   . Sleep apnea    surgery x 2- no cpap  . Staph infection 10-2014  . Thyroid disease    HYPOTHYROID     Social History   Socioeconomic History  . Marital status: Married    Spouse name: linda  . Number of children: 2  . Years of education: college  . Highest education level: Not on file  Occupational History  . Occupation: branch Scientist, product/process development: [employer]  . Financial resource strain: Not on file  . Food insecurity:    Worry: Not on file    Inability: Not on file  . Transportation needs:    Medical: Not on file    Non-medical: Not on file  Tobacco Use  . Smoking status: Never Smoker  . Smokeless tobacco: Never Used  Substance and Sexual Activity  . Alcohol use: No    Alcohol/week: 0.0 standard drinks  . Drug use: No  . Sexual activity: Not on file  Lifestyle  . Physical activity:    Days per  week: Not on file    Minutes per session: Not on file  . Stress: Not on file  Relationships  . Social connections:    Talks on phone: Not on file    Gets together: Not on file    Attends religious service: Not on file    Active member of club or organization: Not on file    Attends meetings of clubs or organizations: Not on file    Relationship status: Not on file  . Intimate partner violence:    Fear of current or ex partner: Not on file    Emotionally abused: Not on file    Physically abused: Not on file    Forced sexual activity: Not on file  Other Topics Concern  . Not on file  Social History Narrative  . Not on file    Past Surgical History:  Procedure Laterality Date  . CATARACT EXTRACTION    . COLONOSCOPY  04-27-2004   tics, hems   . I&D EXTREMITY Right 10/18/2014   Procedure: IRRIGATION AND DEBRIDEMENT RIGHT KNEE PREPATELLA BURSA INFECTION;  Surgeon: [name], MD;  Location: [facility];  Service: Orthopedics;  Laterality:  Right;  Marland Kitchen [place] SURGERY  04-2015  . LUMBAR LAMINECTOMY WITH COFLEX 1 LEVEL N/A 06/06/2015   Procedure: Lumbar three-four Laminectomy/Foraminotomy with placement of Coflex;  Surgeon: [name], MD;  Location: [facility];  Service: Neurosurgery;  Laterality: N/A;  . NASAL SEPTUM SURGERY    . TONSILLECTOMY     and adenoids  . UVULOPALATOPHARYNGOPLASTY      Family History  Problem Relation Age of Onset  . Emphysema Mother   . COPD Mother   . Heart disease Mother   . Hypertension Mother   . Liver cancer Father   . Cancer Father   . Diabetes Father   . Hypertension Father   . Colon polyps Father   . Heart disease Sister   . COPD Sister   . Heart attack Sister   . Hypertension Sister   . Colon polyps Brother   . Stroke Neg Hx   . Colon cancer Neg Hx     Allergies  Allergen Reactions  . Ambien [Zolpidem Tartrate]     Unknown- balance  . Atenolol     unknown  . Lunesta [Eszopiclone]     unknown  . Oxazepam     unknown  .  Requip [Ropinirole Hcl]     unknown  . Ziac [Bisoprolol-Hydrochlorothiazide]     unknown    Current Outpatient Medications on File Prior to Visit  Medication Sig Dispense Refill  . aspirin 81 MG tablet Take 81 mg by mouth daily.    . Cholecalciferol (VITAMIN D) 2000 units CAPS Take 1 capsule by mouth once a week.     . finasteride (PROSCAR) 5 MG tablet Take 5 mg by mouth daily.  11  . fluticasone (FLONASE) 50 MCG/ACT nasal spray USE 1-2 SPRAYS IN EACH NOSTRIL DAILY 16 g 6  . levothyroxine (SYNTHROID, LEVOTHROID) 50 MCG tablet TAKE 1 TABLET(50 MCG) BY MOUTH DAILY 90 tablet 1  . loratadine (CLARITIN) 10 MG tablet Take 10 mg by mouth daily.    Marland Kitchen losartan (COZAAR) 100 MG tablet TAKE 1 TABLET BY MOUTH EVERY DAY 30 tablet 5  . Multiple Vitamins-Minerals (MULTIVITAMIN WITH MINERALS) tablet Take 1 tablet by mouth daily.      . Naproxen Sodium (ALEVE) 220 MG CAPS Take by mouth.    . timolol (BETIMOL) 0.5 % ophthalmic solution Place 1 drop into both eyes 2 (two) times daily.      No current facility-administered medications on file prior to visit.     BP 110/60 (BP Location: Right Arm, Patient Position: Sitting, Cuff Size: Normal)   Pulse 61   Temp 98 F (36.7 C) (Oral)   Wt 195 lb 6.4 oz (88.6 kg)   SpO2 96%   BMI 28.04 kg/m     Review of Systems  Constitutional: Negative.   Skin: Positive for wound.       Objective:   Physical Exam  Constitutional: He appears well-developed and well-nourished. No distress.  Skin:  Paronychia involving the lateral aspect of the right great toe          Assessment & Plan:   Acute paronychia.  The patient will continue soaking 2 times daily.  Will place on Septra DS twice daily We will call if there is no clinical improvement  [name]

## 2018-07-03 DIAGNOSIS — G231 Progressive supranuclear ophthalmoplegia [Steele-Richardson-Olszewski]: Secondary | ICD-10-CM | POA: Diagnosis not present

## 2018-07-08 DIAGNOSIS — Z23 Encounter for immunization: Secondary | ICD-10-CM | POA: Diagnosis not present

## 2018-07-18 DIAGNOSIS — H4720 Unspecified optic atrophy: Secondary | ICD-10-CM | POA: Diagnosis not present

## 2018-07-18 DIAGNOSIS — Z961 Presence of intraocular lens: Secondary | ICD-10-CM | POA: Diagnosis not present

## 2018-07-18 DIAGNOSIS — H401132 Primary open-angle glaucoma, bilateral, moderate stage: Secondary | ICD-10-CM | POA: Diagnosis not present

## 2018-07-27 ENCOUNTER — Other Ambulatory Visit: Payer: Self-pay | Admitting: Internal Medicine

## 2018-08-27 ENCOUNTER — Ambulatory Visit (INDEPENDENT_AMBULATORY_CARE_PROVIDER_SITE_OTHER): Payer: Medicare Other | Admitting: Internal Medicine

## 2018-08-27 VITALS — BP 102/70 | HR 60 | Temp 97.7°F | Wt 194.4 lb

## 2018-08-27 DIAGNOSIS — E039 Hypothyroidism, unspecified: Secondary | ICD-10-CM | POA: Diagnosis not present

## 2018-08-27 DIAGNOSIS — G231 Progressive supranuclear ophthalmoplegia [Steele-Richardson-Olszewski]: Secondary | ICD-10-CM

## 2018-08-27 DIAGNOSIS — E782 Mixed hyperlipidemia: Secondary | ICD-10-CM | POA: Diagnosis not present

## 2018-08-27 DIAGNOSIS — I421 Obstructive hypertrophic cardiomyopathy: Secondary | ICD-10-CM

## 2018-08-27 DIAGNOSIS — I10 Essential (primary) hypertension: Secondary | ICD-10-CM

## 2018-08-27 DIAGNOSIS — G20 Parkinson's disease: Secondary | ICD-10-CM | POA: Diagnosis not present

## 2018-08-27 NOTE — Progress Notes (Signed)
Established Patient Office Visit     CC/Reason for Visit: To establish care and to follow-up on chronic medical conditions  HPI: [name] is a 72 y.o. male who is coming in today for the above mentioned reasons.  Had annual physical in May 2019.  Past Medical History is significant for: Progressive supranuclear palsy followed by neurology, dysphagia, hypothyroidism, hypertension, hyperlipidemia, hypertrophic obstructive cardiomyopathy followed by cardiology on a yearly basis.  [name] has difficulty speaking due to his progressive neurologic condition.  His wife [name] assists in appointment today.  Patient's only complaint is frequent choking due to saliva, wife understands that this is due to his PSP.  He is up-to-date on all health maintenance issues.  Past Medical/Surgical History: Past Medical History:  Diagnosis Date  . Allergic rhinitis   . Allergy   . Arthritis   . At risk for falls   . Cancer ([place])    basal cell skin cancer  . Cataract   . Complication of anesthesia    Slow to awaken  . Constipation   . DDD (degenerative disc disease), cervical   . Dysphagia   . Edema extremities 01/13/2016  . GERD (gastroesophageal reflux disease)    Decdreased, has decreased coffee intack.  . Glaucoma   . HTN (hypertension)   . Hx of adenomatous colonic polyps 01/23/2016  . Hyperlipidemia   . Hypogonadism male   . Hypothyroidism   . OSA (obstructive sleep apnea)    Has had surgery x 2  . Parkinsonism ([place]) 01/16/2013   no tremors, balance issues only , speech and swallowing difficulties   . Pre-diabetes   . Sleep apnea    surgery x 2- no cpap  . Staph infection 10-2014  . Thyroid disease    HYPOTHYROID    Past Surgical History:  Procedure Laterality Date  . CATARACT EXTRACTION    . COLONOSCOPY  04-27-2004   tics, hems   . I&D EXTREMITY Right 10/18/2014   Procedure: IRRIGATION AND DEBRIDEMENT RIGHT KNEE PREPATELLA BURSA INFECTION;  Surgeon: [name], MD;  Location:  [facility];  Service: Orthopedics;  Laterality: Right;  . [place] SURGERY  04-2015  . LUMBAR LAMINECTOMY WITH COFLEX 1 LEVEL N/A 06/06/2015   Procedure: Lumbar three-four Laminectomy/Foraminotomy with placement of Coflex;  Surgeon: [name], MD;  Location: [facility];  Service: Neurosurgery;  Laterality: N/A;  . NASAL SEPTUM SURGERY    . TONSILLECTOMY     and adenoids  . UVULOPALATOPHARYNGOPLASTY      Social History:  reports that he has never smoked. He has never used smokeless tobacco. He reports that he does not drink alcohol or use drugs.  Allergies: Allergies  Allergen Reactions  . Ambien [Zolpidem Tartrate]     Unknown- balance  . Atenolol     unknown  . Lunesta [Eszopiclone]     unknown  . Oxazepam     unknown  . Requip [Ropinirole Hcl]     unknown  . Ziac [Bisoprolol-Hydrochlorothiazide]     unknown    Family History:  Family History  Problem Relation Age of Onset  . Emphysema Mother   . COPD Mother   . Heart disease Mother   . Hypertension Mother   . Liver cancer Father   . Cancer Father   . Diabetes Father   . Hypertension Father   . Colon polyps Father   . Heart disease Sister   . COPD Sister   . Heart attack Sister   .  Hypertension Sister   . Colon polyps Brother   . Stroke Neg Hx   . Colon cancer Neg Hx      Current Outpatient Medications:  .  aspirin 81 MG tablet, Take 81 mg by mouth daily., Disp: , Rfl:  .  Cholecalciferol (VITAMIN D) 2000 units CAPS, Take 1 capsule by mouth once a week. , Disp: , Rfl:  .  fexofenadine (ALLEGRA) 60 MG tablet, Take 60 mg by mouth 2 (two) times daily., Disp: , Rfl:  .  finasteride (PROSCAR) 5 MG tablet, Take 5 mg by mouth daily., Disp: , Rfl: 11 .  fluticasone (FLONASE) 50 MCG/ACT nasal spray, USE 1-2 SPRAYS IN EACH NOSTRIL DAILY, Disp: 16 g, Rfl: 6 .  levothyroxine (SYNTHROID, LEVOTHROID) 50 MCG tablet, TAKE 1 TABLET(50 MCG) BY MOUTH DAILY, Disp: 90 tablet, Rfl: 1 .  losartan (COZAAR) 100 MG tablet, TAKE 1  TABLET BY MOUTH EVERY DAY, Disp: 30 tablet, Rfl: 5 .  Multiple Vitamins-Minerals (MULTIVITAMIN WITH MINERALS) tablet, Take 1 tablet by mouth daily.  , Disp: , Rfl:  .  Naproxen Sodium (ALEVE) 220 MG CAPS, Take by mouth., Disp: , Rfl:  .  timolol (BETIMOL) 0.5 % ophthalmic solution, Place 1 drop into both eyes 2 (two) times daily. , Disp: , Rfl:   Review of Systems:  Constitutional: Denies fever, chills, diaphoresis, appetite change and fatigue.  HEENT: Denies photophobia, eye pain, redness, hearing loss, ear pain, congestion, sore throat, rhinorrhea, sneezing, mouth sores, trouble swallowing, neck pain, neck stiffness and tinnitus.   Respiratory: Denies SOB, DOE, cough, chest tightness,  and wheezing.   Cardiovascular: Denies chest pain, palpitations and leg swelling.  Gastrointestinal: Denies nausea, vomiting, abdominal pain, diarrhea, constipation, blood in stool and abdominal distention.  Genitourinary: Denies dysuria, urgency, frequency, hematuria, flank pain and difficulty urinating.  Endocrine: Denies: hot or cold intolerance, sweats, changes in hair or nails, polyuria, polydipsia. Musculoskeletal: Denies myalgias, back pain, joint swelling, arthralgias and gait problem.  Skin: Denies pallor, rash and wound.  Neurological: Denies dizziness, seizures, syncope, weakness, light-headedness, numbness and headaches.  Hematological: Denies adenopathy. Easy bruising, personal or family bleeding history  Psychiatric/Behavioral: Denies suicidal ideation, mood changes, confusion, nervousness, sleep disturbance and agitation    Physical Exam: Vitals:   08/27/18 1256  BP: 102/70  Pulse: 60  Temp: 97.7 F (36.5 C)  TempSrc: Oral  SpO2: 97%  Weight: 194 lb 6.4 oz (88.2 kg)    Body mass index is 27.89 kg/m.    General exam: Alert, awake, oriented x 3, speech is difficult Respiratory system: Clear to auscultation. Respiratory effort normal. Cardiovascular system:RRR. No murmurs, rubs,  gallops. Gastrointestinal system: Abdomen is nondistended, soft and nontender. No organomegaly or masses felt. Normal bowel sounds heard. Central nervous system: Alert and oriented.  Moves all 4 spontaneously, speech is delayed Extremities: No C/C/E, +pedal pulses Skin: No rashes, lesions or ulcers    Impression and Plan:  Essential hypertension -Well-controlled -On losartan 100.  Mixed hyperlipidemia -LDL 107 on 5/19.  HOCM (hypertrophic obstructive cardiomyopathy) ([place]) -Followed on a yearly basis by cardiology.  PSP (progressive supranuclear palsy) ([place]) -Followed by neurology both locally by Dr. Nancy Nordmann and at [facility].  Acquired hypothyroidism -TSH normal at 3.81 in May 2019. -Continue Synthroid 50 mg daily    Patient Instructions  -Follow-up next April for your physical, please plan to come fasting at that time.  May follow-up sooner if any issues before then.     Lelon Frohlich, MD [facility]

## 2018-08-27 NOTE — Patient Instructions (Signed)
-  Follow-up next April for your physical, please plan to come fasting at that time.  May follow-up sooner if any issues before then.

## 2018-10-17 ENCOUNTER — Other Ambulatory Visit: Payer: Self-pay | Admitting: Internal Medicine

## 2018-10-22 ENCOUNTER — Telehealth: Payer: Self-pay | Admitting: Internal Medicine

## 2018-10-22 NOTE — Telephone Encounter (Signed)
Copied from [place] [id number]. Topic: Quick Communication - Rx Refill/Question >> Oct 22, 2018  9:57 AM Yvette Rack wrote: Medication: levothyroxine (SYNTHROID, LEVOTHROID) 50 MCG tablet  Has the patient contacted their pharmacy? yes   Preferred Pharmacy (with phone number or street name): [id number] - [place],  - [address] [place] [address] [place] [phone] (Phone) [phone])  Agent: Please be advised that RX refills may take up to 3 business days. We ask that you follow-up with your pharmacy.

## 2018-10-22 NOTE — Telephone Encounter (Signed)
Rx has been pended for review at office.

## 2018-10-24 NOTE — Telephone Encounter (Signed)
Pt checking status the med refill. Wife states spouse is out of medication.

## 2018-10-25 ENCOUNTER — Encounter: Payer: Self-pay | Admitting: Internal Medicine

## 2018-10-26 ENCOUNTER — Other Ambulatory Visit: Payer: Self-pay | Admitting: Internal Medicine

## 2018-10-27 ENCOUNTER — Other Ambulatory Visit: Payer: Self-pay | Admitting: Internal Medicine

## 2018-10-27 NOTE — Telephone Encounter (Signed)
Requested medication (s) are due for refill today: yes  Requested medication (s) are on the active medication list: yes  Last refill:  Medications last filled by retired physicians  Future visit scheduled: yes, 01/08/19  Notes to clinic:  Unable to refill per protocol. Last filled by Dr. Sherren Mocha and Dr. Burnice Logan    Requested Prescriptions  Pending Prescriptions Disp Refills   fluticasone (FLONASE) 50 MCG/ACT nasal spray 16 g 6    Sig: USE 1-2 SPRAYS IN EACH NOSTRIL DAILY     Ear, Nose, and Throat: Nasal Preparations - Corticosteroids Passed - 10/27/2018 12:41 PM      Passed - Valid encounter within last 12 months    Recent Outpatient Visits          2 months ago Essential hypertension   [place] at [facility], Rayford Halsted, MD   4 months ago Paronychia of great toe of right foot   [facility] at [facility], Doretha Sou, MD   8 months ago Essential hypertension   [facility] at [facility], Doretha Sou, MD   1 year ago Essential hypertension   [facility] at [facility], Doretha Sou, MD   1 year ago Essential hypertension   [facility] at [facility], Doretha Sou, MD      Future Appointments            In 2 months Isaac Bliss, Rayford Halsted, MD [place] at [place], [facility]   In 2 months  [place] at [place], [facility]          losartan (COZAAR) 100 MG tablet 30 tablet 5    Sig: Take 1 tablet (100 mg total) by mouth daily.     Cardiovascular:  Angiotensin Receptor Blockers Failed - 10/27/2018 12:41 PM      Failed - Cr in normal range and within 180 days    Creat  Date Value Ref Range Status  08/31/2013 1.09 0.50 - 1.35 mg/dL Final   Creatinine, Ser  Date Value Ref Range Status  02/18/2018 0.74 0.40 - 1.50 mg/dL Final         Failed - K in normal range and within 180 days    Potassium  Date Value Ref Range Status  02/18/2018 4.7 3.5 - 5.1 mEq/L Final         Passed -  Patient is not pregnant      Passed - Last BP in normal range    BP Readings from Last 1 Encounters:  08/27/18 102/70         Passed - Valid encounter within last 6 months    Recent Outpatient Visits          2 months ago Essential hypertension   [facility]rap[facility] at [facility] [facility], Rayford Halsted, MD   4 months ago Paronychia of great toe of right foot   Therapist, [facility] at [facility], Doretha Sou, MD   8 months ago Essential hypertension   Therap[facility] at [facility], Doretha Sou, MD   1 year ago Essential hypertension   Therapist, [facility] at [facility], Doretha Sou, MD   1 year ago Essential hypertension   Therapist, music at [facility], Doretha Sou, MD      Future Appointments            In 2 months Isaac Bliss, Rayford Halsted, MD [place] at [place], [facility]   In 2 months  [employer] at [place], [facility]

## 2018-10-27 NOTE — Telephone Encounter (Signed)
Pt's wife called to check status of refill request.   Not sure why [facility] could not fill as meets refill requirements. Rx request created 10 days ago.  Rx sent to pharmacy.

## 2018-10-27 NOTE — Telephone Encounter (Signed)
Copied from [place] [id number]. Topic: Quick Communication - Rx Refill/Question >> Oct 27, 2018  9:32 AM Waylan Rocher, Lumin L wrote: Medication: losartan (COZAAR) 100 MG tablet, fluticasone (FLONASE) 50 MCG/ACT nasal spray   Has the patient contacted their pharmacy? Yes.   (Agent: If no, request that the patient contact the pharmacy for the refill.) (Agent: If yes, when and what did the pharmacy advise?)  Preferred Pharmacy (with phone number or street name): [id number] [place], [place] - [address] [place] [address] Phone: [phone] Fax: [phone]  Agent: Please be advised that RX refills may take up to 3 business days. We ask that you follow-up with your pharmacy.

## 2018-10-28 MED ORDER — FLUTICASONE PROPIONATE 50 MCG/ACT NA SUSP: NASAL | 5 refills | Status: DC

## 2018-10-28 MED ORDER — LOSARTAN POTASSIUM 100 MG PO TABS: 100.0000 mg | ORAL_TABLET | Freq: Every day | ORAL | 1 refills | Status: DC

## 2018-10-31 ENCOUNTER — Other Ambulatory Visit: Payer: Self-pay | Admitting: Internal Medicine

## 2018-11-06 DIAGNOSIS — N319 Neuromuscular dysfunction of bladder, unspecified: Secondary | ICD-10-CM | POA: Diagnosis not present

## 2018-11-06 DIAGNOSIS — N3946 Mixed incontinence: Secondary | ICD-10-CM | POA: Diagnosis not present

## 2018-11-18 ENCOUNTER — Telehealth: Payer: Self-pay | Admitting: Internal Medicine

## 2018-11-18 NOTE — Telephone Encounter (Signed)
[facility] pharmacy several times, fast busy signal.  Will have to try back later

## 2018-11-18 NOTE — Telephone Encounter (Signed)
Copied from [place] [phone]. Topic: Quick Communication - See Telephone Encounter >> Nov 18, 2018  9:32 AM Rutherford Nail, NT wrote: CRM for notification. See Telephone encounter for: 11/18/18. [facility] P[facility] calling and states that the patient's wife spoke with her and said "on the portal it shows that a prescription was sent." Calling and would like a verbal for the patient's losartan (COZAAR) 100 MG tablet from 10/28/2018. Please advise. CB#: [phone]

## 2018-11-19 NOTE — Telephone Encounter (Signed)
Attempted to call pharmacy but line is busy.  Will try again at another time.

## 2018-11-20 NOTE — Telephone Encounter (Signed)
Pt's spouse is calling back in to follow up. I did advise that CMA has been trying to reach pharmacy to give verbal for Rx. Spouse would like to know if office could retry. [facility] Spouse says that they have been trying at this for weeks.

## 2018-11-21 NOTE — Telephone Encounter (Signed)
Spoke with pharmacist and refill ready for pick up.

## 2018-11-24 ENCOUNTER — Encounter: Payer: Self-pay | Admitting: Neurology

## 2018-11-24 ENCOUNTER — Ambulatory Visit (INDEPENDENT_AMBULATORY_CARE_PROVIDER_SITE_OTHER): Payer: Medicare Other | Admitting: Neurology

## 2018-11-24 VITALS — BP 113/66 | HR 49 | Ht 70.0 in | Wt 192.0 lb

## 2018-11-24 DIAGNOSIS — G231 Progressive supranuclear ophthalmoplegia [Steele-Richardson-Olszewski]: Secondary | ICD-10-CM | POA: Diagnosis not present

## 2018-11-24 DIAGNOSIS — Z9181 History of falling: Secondary | ICD-10-CM | POA: Diagnosis not present
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
## 2018-11-24 NOTE — Patient Instructions (Addendum)
Please discuss with [facility] neurology if you want to pursue the botulinum toxin injections for excess salivation.  I will refer you to neuro rehab for reevaluations with PT, OT and ST.  I will see you in one year. You are at high fall risk.

## 2018-11-27 ENCOUNTER — Other Ambulatory Visit: Payer: Self-pay | Admitting: *Deleted

## 2018-11-27 MED ORDER — LOSARTAN POTASSIUM 100 MG PO TABS: 100.0000 mg | ORAL_TABLET | Freq: Every day | ORAL | 1 refills | Status: DC

## 2019-01-01 NOTE — Telephone Encounter (Signed)
Author phoned pt. To offer virtual AWV with PCP. Author spoke with wife, [name], who preferred to set up in-person visits. Both CPE with Dr. Jerilee Hoh and AWV with health coach rescheduled for 7/15.

## 2019-01-08 ENCOUNTER — Encounter: Payer: Medicare Other | Admitting: Internal Medicine

## 2019-01-20 ENCOUNTER — Ambulatory Visit: Payer: Medicare Other

## 2019-02-16 ENCOUNTER — Encounter: Payer: Self-pay | Admitting: Internal Medicine

## 2019-03-25 DIAGNOSIS — H04123 Dry eye syndrome of bilateral lacrimal glands: Secondary | ICD-10-CM | POA: Diagnosis not present

## 2019-03-25 DIAGNOSIS — H4720 Unspecified optic atrophy: Secondary | ICD-10-CM | POA: Diagnosis not present

## 2019-03-25 DIAGNOSIS — H401132 Primary open-angle glaucoma, bilateral, moderate stage: Secondary | ICD-10-CM | POA: Diagnosis not present

## 2019-03-25 DIAGNOSIS — H1789 Other corneal scars and opacities: Secondary | ICD-10-CM | POA: Diagnosis not present

## 2019-04-05 ENCOUNTER — Other Ambulatory Visit: Payer: Self-pay | Admitting: Internal Medicine

## 2019-04-22 ENCOUNTER — Encounter: Payer: Self-pay | Admitting: Internal Medicine

## 2019-04-22 ENCOUNTER — Ambulatory Visit: Payer: Medicare Other

## 2019-04-22 ENCOUNTER — Ambulatory Visit (INDEPENDENT_AMBULATORY_CARE_PROVIDER_SITE_OTHER): Payer: Medicare Other | Admitting: Internal Medicine

## 2019-04-22 ENCOUNTER — Other Ambulatory Visit: Payer: Self-pay

## 2019-04-22 VITALS — BP 110/70 | HR 51 | Temp 99.1°F | Ht 70.0 in | Wt 186.8 lb

## 2019-04-22 DIAGNOSIS — E782 Mixed hyperlipidemia: Secondary | ICD-10-CM

## 2019-04-22 DIAGNOSIS — G609 Hereditary and idiopathic neuropathy, unspecified: Secondary | ICD-10-CM

## 2019-04-22 DIAGNOSIS — J302 Other seasonal allergic rhinitis: Secondary | ICD-10-CM

## 2019-04-22 DIAGNOSIS — G231 Progressive supranuclear ophthalmoplegia [Steele-Richardson-Olszewski]: Secondary | ICD-10-CM

## 2019-04-22 DIAGNOSIS — R300 Dysuria: Secondary | ICD-10-CM

## 2019-04-22 DIAGNOSIS — J3089 Other allergic rhinitis: Secondary | ICD-10-CM | POA: Diagnosis not present

## 2019-04-22 DIAGNOSIS — I10 Essential (primary) hypertension: Secondary | ICD-10-CM | POA: Diagnosis not present

## 2019-04-22 DIAGNOSIS — E039 Hypothyroidism, unspecified: Secondary | ICD-10-CM

## 2019-04-22 DIAGNOSIS — G4700 Insomnia, unspecified: Secondary | ICD-10-CM

## 2019-04-22 DIAGNOSIS — I421 Obstructive hypertrophic cardiomyopathy: Secondary | ICD-10-CM

## 2019-04-22 LAB — POCT URINALYSIS DIPSTICK
Bilirubin, UA: NEGATIVE
Blood, UA: NEGATIVE
Glucose, UA: NEGATIVE
Ketones, UA: NEGATIVE
Nitrite, UA: NEGATIVE
Protein, UA: NEGATIVE
Spec Grav, UA: 1.02 (ref 1.010–1.025)
Urobilinogen, UA: 0.2 E.U./dL
pH, UA: 6 (ref 5.0–8.0)

## 2019-04-22 MED ORDER — FLUTICASONE PROPIONATE 50 MCG/ACT NA SUSP: NASAL | 2 refills | Status: DC

## 2019-04-22 NOTE — Progress Notes (Signed)
Established Patient Office Visit     CC/Reason for Visit: Follow-up chronic conditions  HPI: [name] is a 73 y.o. male who is coming in today for the above mentioned reasons. Past Medical History is significant for: Progressive supranuclear palsy followed by neurology, dysphagia, hypothyroidism, hypertension, hyperlipidemia, hypertrophic obstructive cardiomyopathy followed by cardiology on a yearly basis.  [name] has difficulty speaking due to his progressive neurologic condition.  His wife [name] assists in appointment today.  He seems more confused than usual.  He has several complaints today:  1.  "I think I have cellulitis"  2.  "Neuropathy, bad"  3.  "Urinate all the time"   Past Medical/Surgical History: Past Medical History:  Diagnosis Date  . Allergic rhinitis   . Allergy   . Arthritis   . At risk for falls   . Cancer ([place])    basal cell skin cancer  . Cataract   . Complication of anesthesia    Slow to awaken  . Constipation   . DDD (degenerative disc disease), cervical   . Dysphagia   . Edema extremities 01/13/2016  . GERD (gastroesophageal reflux disease)    Decdreased, has decreased coffee intack.  . Glaucoma   . HTN (hypertension)   . Hx of adenomatous colonic polyps 01/23/2016  . Hyperlipidemia   . Hypogonadism male   . Hypothyroidism   . OSA (obstructive sleep apnea)    Has had surgery x 2  . Parkinsonism ([place]) 01/16/2013   no tremors, balance issues only , speech and swallowing difficulties   . Pre-diabetes   . Sleep apnea    surgery x 2- no cpap  . Staph infection 10-2014  . Thyroid disease    HYPOTHYROID    Past Surgical History:  Procedure Laterality Date  . CATARACT EXTRACTION    . COLONOSCOPY  04-27-2004   tics, hems   . I&D EXTREMITY Right 10/18/2014   Procedure: IRRIGATION AND DEBRIDEMENT RIGHT KNEE PREPATELLA BURSA INFECTION;  Surgeon: [name], MD;  Location: [place];  Service: Orthopedics;  Laterality: Right;  . [place]  SURGERY  04-2015  . LUMBAR LAMINECTOMY WITH COFLEX 1 LEVEL N/A 06/06/2015   Procedure: Lumbar three-four Laminectomy/Foraminotomy with placement of Coflex;  Surgeon: [name], MD;  Location: [place] [facility];  Service: Neurosurgery;  Laterality: N/A;  . NASAL SEPTUM SURGERY    . TONSILLECTOMY     and adenoids  . UVULOPALATOPHARYNGOPLASTY      Social History:  reports that he has never smoked. He has never used smokeless tobacco. He reports that he does not drink alcohol or use drugs.  Allergies: Allergies  Allergen Reactions  . Ambien [Zolpidem Tartrate]     Unknown- balance  . Atenolol     unknown  . Lunesta [Eszopiclone]     unknown  . Oxazepam     unknown  . Requip [Ropinirole Hcl]     unknown  . Ziac [Bisoprolol-Hydrochlorothiazide]     unknown    Family History:  Family History  Problem Relation Age of Onset  . Emphysema Mother   . COPD Mother   . Heart disease Mother   . Hypertension Mother   . Liver cancer Father   . Cancer Father   . Diabetes Father   . Hypertension Father   . Colon polyps Father   . Heart disease Sister   . COPD Sister   . Heart attack Sister   . Hypertension Sister   . Colon polyps Brother   .  Stroke Neg Hx   . Colon cancer Neg Hx      Current Outpatient Medications:  .  aspirin 81 MG tablet, Take 81 mg by mouth daily., Disp: , Rfl:  .  Cholecalciferol (VITAMIN D) 2000 units CAPS, Take 1 capsule by mouth once a week. , Disp: , Rfl:  .  fexofenadine (ALLEGRA) 60 MG tablet, Take 60 mg by mouth daily. , Disp: , Rfl:  .  finasteride (PROSCAR) 5 MG tablet, Take 5 mg by mouth daily., Disp: , Rfl: 11 .  fluticasone (FLONASE) 50 MCG/ACT nasal spray, Use 1 spray in each nostril daily, Disp: 48 g, Rfl: 2 .  levothyroxine (SYNTHROID) 50 MCG tablet, TAKE 1 TABLET BY MOUTH EVERY DAY, Disp: 90 tablet, Rfl: 1 .  losartan (COZAAR) 100 MG tablet, Take 1 tablet (100 mg total) by mouth daily., Disp: 90 tablet, Rfl: 1 .  Multiple Vitamins-Minerals  (MULTIVITAMIN WITH MINERALS) tablet, Take 1 tablet by mouth daily.  , Disp: , Rfl:  .  Naproxen Sodium (ALEVE) 220 MG CAPS, Take by mouth., Disp: , Rfl:  .  timolol (BETIMOL) 0.5 % ophthalmic solution, Place 1 drop into both eyes 2 (two) times daily. , Disp: , Rfl:   Review of Systems:  Constitutional: Denies fever, chills, diaphoresis, appetite change and fatigue.  HEENT: Denies photophobia, eye pain, redness, hearing loss, ear pain, congestion, sore throat, rhinorrhea, sneezing, mouth sores, trouble swallowing, neck pain, neck stiffness and tinnitus.   Respiratory: Denies SOB, DOE, cough, chest tightness,  and wheezing.   Cardiovascular: Denies chest pain, palpitations and leg swelling.  Gastrointestinal: Denies nausea, vomiting, abdominal pain, diarrhea, constipation, blood in stool and abdominal distention.  Genitourinary: Denies dysuria, urgency, frequency, hematuria, flank pain and difficulty urinating.  Endocrine: Denies: hot or cold intolerance, sweats, changes in hair or nails, polyuria, polydipsia. Musculoskeletal: Denies myalgias, back pain, joint swelling, arthralgias and gait problem.  Skin: Denies pallor, rash and wound.  Neurological: Denies dizziness, seizures, syncope, weakness, light-headedness, numbness and headaches.  Hematological: Denies adenopathy. Easy bruising, personal or family bleeding history  Psychiatric/Behavioral: Denies suicidal ideation, mood changes, confusion, nervousness, sleep disturbance and agitation    Physical Exam: Vitals:   04/22/19 1306  BP: 110/70  Pulse: (!) 51  Temp: 99.1 F (37.3 C)  TempSrc: Oral  SpO2: 97%  Weight: 186 lb 12.8 oz (84.7 kg)  Height: 5\' 10"  (1.778 m)    Body mass index is 26.8 kg/m.   Constitutional: NAD, calm, comfortable, uses walker to ambulate, has a safety belt in place Eyes: PERRL, lids and conjunctivae normal ENMT: Mucous membranes are moist.  Respiratory: clear to auscultation bilaterally, no wheezing,  no crackles. Normal respiratory effort. No accessory muscle use.  Cardiovascular: Regular rate and rhythm, no murmurs / rubs / gallops. No extremity edema. 2+ pedal pulses. No carotid bruits.  Abdomen: no tenderness, no masses palpated. No hepatosplenomegaly. Bowel sounds positive.  Musculoskeletal: no clubbing / cyanosis. No joint deformity upper and lower extremities. Good ROM, no contractures. Normal muscle tone.  Skin: no rashes, lesions, ulcers. No induration Neurologic: CN 2-12 grossly intact. Sensation intact, DTR normal. Strength 5/5 in all 4.  Psychiatric: Normal judgment and insight. Alert and oriented x 3. Normal mood.    Impression and Plan:  PSP (progressive supranuclear palsy) ([place]) Idiopathic peripheral neuropathy -Being followed by neurology. -Patient states his neuropathy is a big bother, however wife tells me that he had been on Lyrica and Neurontin in the past that needed to be discontinued due  to significant lethargy and sedation.  Essential hypertension -Well-controlled on losartan.  Mixed hyperlipidemia -Last LDL was 107 in May 2019.  Insomnia, unspecified type -Is still an issue at times but does not take any medications for this.  Seasonal and perennial allergic rhinitis  -On Allegra and Flonase.  HOCM (hypertrophic obstructive cardiomyopathy) ([place]) -Follows with cardiology as scheduled, asymptomatic  Acquired hypothyroidism -Last TSH was 3.810 in May 2019, continue current Synthroid dose.  In regards to his acute complaint: He has no apparent cellulitis on exam, for frequent urination urine dipstick is negative for signs of infection, he is already on finasteride for known history of BPH and follows with urology.     Patient Instructions  -Nice seeing you today!  -Will see you back in 6 months or sooner as needed.     Lelon Frohlich, MD [facility] at [facility]

## 2019-04-22 NOTE — Patient Instructions (Signed)
-  Nice seeing you today!  -Will see you back in 6 months or sooner as needed.

## 2019-05-01 ENCOUNTER — Telehealth: Payer: Self-pay

## 2019-05-01 NOTE — Telephone Encounter (Signed)
Needs follow up appointment. No answer when called. 

## 2019-05-26 ENCOUNTER — Other Ambulatory Visit: Payer: Self-pay | Admitting: *Deleted

## 2019-05-26 MED ORDER — LOSARTAN POTASSIUM 100 MG PO TABS: 100.0000 mg | ORAL_TABLET | Freq: Every day | ORAL | 1 refills | Status: DC

## 2019-06-29 NOTE — Progress Notes (Signed)
Cardiology Office Note:    Date:  06/30/2019   ID:  [name], DOB Nov 01, 1945, MRN [id number]  PCP:  Isaac Bliss, Rayford Halsted, MD  Cardiologist:  [name], MD    Referring MD: Isaac Bliss, Est[place]*   Chief Complaint  Patient presents with  . Follow-up    HOCM, HTN, edema    History of Present Illness:    [name] is a 73 y.o. male with a hx of HTN, OSA, Parkinson's, hyperlipidemia, pre- diabetes, asymmetric septal hypertrophy c/w HOCM with no outflow tact gradientand hypothyroidism.  He is here today for followup and is doing well.  He denies any chest pain or pressure, SOB, DOE, PND, orthopnea, LE edema, dizziness, palpitations or syncope. He is compliant with his meds and is tolerating meds with no SE.    Past Medical History:  Diagnosis Date  . Allergic rhinitis   . Allergy   . Arthritis   . At risk for falls   . Cancer ([place])    basal cell skin cancer  . Cataract   . Complication of anesthesia    Slow to awaken  . Constipation   . DDD (degenerative disc disease), cervical   . Dysphagia   . Edema extremities 01/13/2016  . GERD (gastroesophageal reflux disease)    Decdreased, has decreased coffee intack.  . Glaucoma   . HTN (hypertension)   . Hx of adenomatous colonic polyps 01/23/2016  . Hyperlipidemia   . Hypogonadism male   . Hypothyroidism   . OSA (obstructive sleep apnea)    Has had surgery x 2  . Parkinsonism ([place]) 01/16/2013   no tremors, balance issues only , speech and swallowing difficulties   . Pre-diabetes   . Sleep apnea    surgery x 2- no cpap  . Staph infection 10-2014  . Thyroid disease    HYPOTHYROID    Past Surgical History:  Procedure Laterality Date  . CATARACT EXTRACTION    . COLONOSCOPY  04-27-2004   tics, hems   . I&D EXTREMITY Right 10/18/2014   Procedure: IRRIGATION AND DEBRIDEMENT RIGHT KNEE PREPATELLA BURSA INFECTION;  Surgeon: [name], MD;  Location: [place] [facility];  Service: Orthopedics;  Laterality: Right;  . [place] SURGERY  04-2015  . LUMBAR LAMINECTOMY WITH COFLEX 1 LEVEL N/A 06/06/2015   Procedure: Lumbar three-four Laminectomy/Foraminotomy with placement of Coflex;  Surgeon: [name], MD;  Location: [place] [facility];  Service: Neurosurgery;  Laterality: N/A;  . NASAL SEPTUM SURGERY    . TONSILLECTOMY     and adenoids  . UVULOPALATOPHARYNGOPLASTY      Current Medications: Current Meds  Medication Sig  . aspirin 81 MG tablet Take 81 mg by mouth daily.  . Cholecalciferol (VITAMIN D) 2000 units CAPS Take 1 capsule by mouth once a week.   . fexofenadine (ALLEGRA) 60 MG tablet Take 60 mg by mouth daily.   . finasteride (PROSCAR) 5 MG tablet Take 5 mg by mouth daily.  . fluticasone (FLONASE) 50 MCG/ACT nasal spray Use 1 spray in each nostril daily  . levothyroxine (SYNTHROID) 50 MCG tablet TAKE 1 TABLET BY MOUTH EVERY DAY  . losartan (COZAAR) 100 MG tablet Take 1 tablet (100 mg total) by mouth daily.  . Multiple Vitamins-Minerals (MULTIVITAMIN WITH MINERALS) tablet Take 1 tablet by mouth daily.    . Naproxen Sodium (ALEVE) 220 MG CAPS Take by mouth.  . timolol (BETIMOL) 0.5 % ophthalmic solution Place 1 drop into both eyes 2 (two) times daily.  Allergies:   Ambien [zolpidem tartrate], Atenolol, Lunesta [eszopiclone], Oxazepam, Requip [ropinirole hcl], and Ziac [bisoprolol-hydrochlorothiazide]   Social History   Socioeconomic History  . Marital status: Married    Spouse name: linda  . Number of children: 2  . Years of education: college  . Highest education level: Not on file  Occupational History  . Occupation: branch Scientist, product/process development: [employer]  . Financial resource strain: Not on file  . Food insecurity    Worry: Not on file    Inability: Not on file  . Transportation needs    Medical: Not on file    Non-medical: Not on file  Tobacco Use  . Smoking status: Never Smoker  . Smokeless tobacco: Never Used  Substance and Sexual Activity  . Alcohol use:  No    Alcohol/week: 0.0 standard drinks  . Drug use: No  . Sexual activity: Not on file  Lifestyle  . Physical activity    Days per week: Not on file    Minutes per session: Not on file  . Stress: Not on file  Relationships  . Social Herbalist on phone: Not on file    Gets together: Not on file    Attends religious service: Not on file    Active member of club or organization: Not on file    Attends meetings of clubs or organizations: Not on file    Relationship status: Not on file  Other Topics Concern  . Not on file  Social History Narrative  . Not on file     Family History: The patient's family history includes COPD in his mother and sister; Cancer in his father; Colon polyps in his brother and father; Diabetes in his father; Emphysema in his mother; Heart attack in his sister; Heart disease in his mother and sister; Hypertension in his father, mother, and sister; Liver cancer in his father. There is no history of Stroke or Colon cancer.  ROS:   Please see the history of present illness.    ROS  All other systems reviewed and negative.   EKGs/Labs/Other Studies Reviewed:    The following studies were reviewed today: none  EKG:  EKG is not  ordered today.    Recent Labs: No results found for requested labs within last 8760 hours.   Recent Lipid Panel    Component Value Date/Time   CHOL 174 02/18/2018 1100   TRIG 235.0 (H) 02/18/2018 1100   HDL 37.80 (L) 02/18/2018 1100   CHOLHDL 5 02/18/2018 1100   VLDL 47.0 (H) 02/18/2018 1100   LDLCALC 108 (H) 09/30/2015 1117   LDLDIRECT 107.0 02/18/2018 1100    Physical Exam:    VS:  BP (!) 110/56   Pulse (!) 55   Ht 5\' 10"  (1.778 m)   Wt 187 lb (84.8 kg)   BMI 26.83 kg/m     Wt Readings from Last 3 Encounters:  06/30/19 187 lb (84.8 kg)  04/22/19 186 lb 12.8 oz (84.7 kg)  11/24/18 192 lb (87.1 kg)     GEN:  Well nourished, well developed in no acute distress HEENT: Normal NECK: No JVD; No carotid  bruits LYMPHATICS: No lymphadenopathy CARDIAC: RRR, no murmurs, rubs, gallops RESPIRATORY:  Clear to auscultation without rales, wheezing or rhonchi  ABDOMEN: Soft, non-tender, non-distended MUSCULOSKELETAL:  No edema; No deformity  SKIN: Warm and dry NEUROLOGIC:  Alert and oriented x 3 PSYCHIATRIC:  Normal affect   ASSESSMENT:  1. HOCM (hypertrophic obstructive cardiomyopathy) ([place])   2. Essential hypertension   3. Edema leg    PLAN:    In order of problems listed above:  1.  HOCM - ? Dx - echo 2018 showed moderate BSH with normal LVF and no LVOT obstruction or SAM.  He has not hda a cardiac MRI but given advanced Parkinson's would not pursue Cardiac MRI at this time.  He has not had any syncopal episodes. He has not had any LE edema or SOB. EKG today with sinus brady at 55bpm with no ST changes  2.  HTN - BP is controlled on exam today.  He will continue on Losartan 100mg  daily.  3.  Chronic LE edema - this is well controlled with compression hose.     Medication Adjustments/Labs and Tests Ordered: Current medicines are reviewed at length with the patient today.  Concerns regarding medicines are outlined above.  Orders Placed This Encounter  Procedures  . EKG 12-Lead   No orders of the defined types were placed in this encounter.   Signed, [name], MD  06/30/2019 1:48 PM    [place]

## 2019-06-30 ENCOUNTER — Ambulatory Visit (INDEPENDENT_AMBULATORY_CARE_PROVIDER_SITE_OTHER): Payer: Medicare Other | Admitting: Cardiology

## 2019-06-30 ENCOUNTER — Other Ambulatory Visit: Payer: Self-pay

## 2019-06-30 ENCOUNTER — Encounter: Payer: Self-pay | Admitting: Cardiology

## 2019-06-30 VITALS — BP 110/56 | HR 55 | Ht 70.0 in | Wt 187.0 lb

## 2019-06-30 DIAGNOSIS — I10 Essential (primary) hypertension: Secondary | ICD-10-CM

## 2019-06-30 DIAGNOSIS — I421 Obstructive hypertrophic cardiomyopathy: Secondary | ICD-10-CM

## 2019-06-30 DIAGNOSIS — R600 Localized edema: Secondary | ICD-10-CM | POA: Diagnosis not present

## 2019-06-30 NOTE — Patient Instructions (Signed)
Medication Instructions:  Your physician recommends that you continue on your current medications as directed. Please refer to the Current Medication list given to you today.  If you need a refill on your cardiac medications before your next appointment, please call your pharmacy.   Lab work: None Ordered   Testing/Procedures: None Ordered  Follow-Up: At [facility], you and your health needs are our priority.  As part of our continuing mission to provide you with exceptional heart care, we have created designated Provider Care Teams.  These Care Teams include your primary Cardiologist (physician) and Advanced Practice Providers (APPs -  Physician Assistants and Nurse Practitioners) who all work together to provide you with the care you need, when you need it. You will need a follow up appointment in 1 years.  Please call our office 2 months in advance to schedule this appointment.  You may see [name], MD or one of the following Advanced Practice Providers on your designated Care Team:   [place], PA-C Melina Copa, PA-C . Ermalinda Barrios, PA-C .

## 2019-07-08 ENCOUNTER — Other Ambulatory Visit: Payer: Self-pay

## 2019-07-08 ENCOUNTER — Ambulatory Visit (INDEPENDENT_AMBULATORY_CARE_PROVIDER_SITE_OTHER): Payer: Medicare Other

## 2019-07-08 DIAGNOSIS — Z23 Encounter for immunization: Secondary | ICD-10-CM

## 2019-07-10 ENCOUNTER — Ambulatory Visit (INDEPENDENT_AMBULATORY_CARE_PROVIDER_SITE_OTHER)
Admission: EM | Admit: 2019-07-10 | Discharge: 2019-07-10 | Disposition: A | Discharge status: Home / Self Care | Payer: Medicare Other | Source: Home / Self Care | Attending: Family Medicine | Admitting: Family Medicine

## 2019-07-10 ENCOUNTER — Ambulatory Visit: Payer: Self-pay

## 2019-07-10 ENCOUNTER — Emergency Department (HOSPITAL_COMMUNITY)
Admission: EM | Admit: 2019-07-10 | Discharge: 2019-07-10 | Discharge status: Against Medical Advice | Payer: Medicare Other | Attending: Emergency Medicine | Admitting: Emergency Medicine

## 2019-07-10 ENCOUNTER — Encounter (HOSPITAL_COMMUNITY): Payer: Self-pay

## 2019-07-10 ENCOUNTER — Other Ambulatory Visit: Payer: Self-pay

## 2019-07-10 DIAGNOSIS — Z5321 Procedure and treatment not carried out due to patient leaving prior to being seen by health care provider: Secondary | ICD-10-CM | POA: Insufficient documentation

## 2019-07-10 DIAGNOSIS — R519 Headache, unspecified: Secondary | ICD-10-CM | POA: Diagnosis not present

## 2019-07-10 DIAGNOSIS — W19XXXA Unspecified fall, initial encounter: Secondary | ICD-10-CM

## 2019-07-10 NOTE — ED Triage Notes (Signed)
Pt fell as a result of Parkinson's on Wednesday, abrasion on nose, hands, and feet. No LOC. Pt hit head and woke up today with headache, took Tylenol with no relief. Family reports mental status at baseline. No blood thinners.

## 2019-07-10 NOTE — ED Triage Notes (Signed)
Spouse of the patient report the patient fell 2 days ago and hit his head, he is taking Tylenol, is not helping with the headache.

## 2019-07-10 NOTE — ED Provider Notes (Signed)
[facility]    CSN: [id number] Arrival date & time: 07/10/19  1509      History   Chief Complaint Chief Complaint  Patient presents with   Headache    HPI [name] is a 73 y.o. male.   [name] presents with his wife with left frontal headache which started today. He took tylenol which hasn't helped with his pain. Doesn't typically have headaches so this was new for him. Two days ago his foot froze causing him to fall forward, striking his face on his hardwood floor. Caused an abrasion to the bridge of his nose. Didn't have any headache at that time. Didn't lose consciousness. Has otherwise had normal behavior. History  Of progressive supranuclear palsy. Walks with a walker. He is ambulatory here today. No vomiting.     ROS per HPI, negative if not otherwise mentioned.      Past Medical History:  Diagnosis Date   Allergic rhinitis    Allergy    Arthritis    At risk for falls    Cancer (HCC)    basal cell skin cancer   Cataract    Complication of anesthesia    Slow to awaken   Constipation    DDD (degenerative disc disease), cervical    Dysphagia    Edema extremities 01/13/2016   GERD (gastroesophageal reflux disease)    Decdreased, has decreased coffee intack.   Glaucoma    HTN (hypertension)    Hx of adenomatous colonic polyps 01/23/2016   Hyperlipidemia    Hypogonadism male    Hypothyroidism    OSA (obstructive sleep apnea)    Has had surgery x 2   Parkinsonism ([place]) 01/16/2013   no tremors, balance issues only , speech and swallowing difficulties    Pre-diabetes    Sleep apnea    surgery x 2- no cpap   Staph infection 10-2014   Thyroid disease    HYPOTHYROID    Patient Active Problem List   Diagnosis Date Noted   Hypothyroidism 02/18/2018   Peripheral neuropathy 11/14/2016   PSP (progressive supranuclear palsy) ([place]) 11/08/2016   Hx of adenomatous colonic polyps 01/23/2016   Edema extremities  01/13/2016   Lumbar stenosis 06/06/2015   HOCM (hypertrophic obstructive cardiomyopathy) ([place]) 12/12/2014   Infection of right prepatellar bursa 10/18/2014   DOE (dyspnea on exertion) 09/06/2014   Fatigue due to excessive exertion 09/06/2014   Testosterone deficiency 11/12/2013   Routine general medical examination at a health care facility 08/30/2013   Mixed hyperlipidemia 08/30/2013   Impotence of organic origin 08/30/2013   Parkinsonism ([place]) 01/16/2013   INSOMNIA 06/06/2008   GLAUCOMA 06/01/2008   Essential hypertension 06/01/2008   Seasonal and perennial allergic rhinitis 06/01/2008    Past Surgical History:  Procedure Laterality Date   CATARACT EXTRACTION     COLONOSCOPY  04-27-2004   tics, hems    I&D EXTREMITY Right 10/18/2014   Procedure: IRRIGATION AND DEBRIDEMENT RIGHT KNEE PREPATELLA BURSA INFECTION;  Surgeon: [name], MD;  Location: [facility];  Service: Orthopedics;  Laterality: Right;   LUMBAR DISC SURGERY  04-2015   LUMBAR LAMINECTOMY WITH COFLEX 1 LEVEL N/A 06/06/2015   Procedure: Lumbar three-four Laminectomy/Foraminotomy with placement of Coflex;  Surgeon: [name], MD;  Location: [facility];  Service: Neurosurgery;  Laterality: N/A;   NASAL SEPTUM SURGERY     TONSILLECTOMY     and adenoids   UVULOPALATOPHARYNGOPLASTY         Home Medications  Prior to Admission medications   Medication Sig Start Date End Date Taking? Authorizing Provider  acetaminophen (TYLENOL) 500 MG tablet Take 500 mg by mouth every 6 (six) hours as needed.   Yes [provider]  aspirin 81 MG tablet Take 81 mg by mouth daily.    [provider]  Cholecalciferol (VITAMIN D) 2000 units CAPS Take 1 capsule by mouth once a week.     [provider]  fexofenadine (ALLEGRA) 60 MG tablet Take 60 mg by mouth daily.     [provider]  finasteride (PROSCAR) 5 MG tablet Take 5 mg by mouth daily. 03/16/16   [provider]    fluticasone Asencion Islam) 50 MCG/ACT nasal spray Use 1 spray in each nostril daily 04/22/19   Isaac Bliss, Rayford Halsted, MD  levothyroxine (SYNTHROID) 50 MCG tablet TAKE 1 TABLET BY MOUTH EVERY DAY 04/07/19   Isaac Bliss, Rayford Halsted, MD  losartan (COZAAR) 100 MG tablet Take 1 tablet (100 mg total) by mouth daily. 05/26/19   Isaac Bliss, Rayford Halsted, MD  Multiple Vitamins-Minerals (MULTIVITAMIN WITH MINERALS) tablet Take 1 tablet by mouth daily.      [provider]  Naproxen Sodium (ALEVE) 220 MG CAPS Take by mouth.    [provider]  timolol (BETIMOL) 0.5 % ophthalmic solution Place 1 drop into both eyes 2 (two) times daily.     [provider]    Family History Family History  Problem Relation Age of Onset   Emphysema Mother    COPD Mother    Heart disease Mother    Hypertension Mother    Liver cancer Father    Cancer Father    Diabetes Father    Hypertension Father    Colon polyps Father    Heart disease Sister    COPD Sister    Heart attack Sister    Hypertension Sister    Colon polyps Brother    Stroke Neg Hx    Colon cancer Neg Hx     Social History Social History   Tobacco Use   Smoking status: Never Smoker   Smokeless tobacco: Never Used  Substance Use Topics   Alcohol use: No    Alcohol/week: 0.0 standard drinks   Drug use: No     Allergies   Ambien [zolpidem tartrate], Atenolol, Lunesta [eszopiclone], Oxazepam, Requip [ropinirole hcl], and Ziac [bisoprolol-hydrochlorothiazide]   Review of Systems Review of Systems   Physical Exam Triage Vital Signs ED Triage Vitals  Enc Vitals Group     BP 07/10/19 1536 119/64     Pulse Rate 07/10/19 1536 (!) 53     Resp 07/10/19 1536 16     Temp 07/10/19 1536 98.3 F (36.8 C)     Temp Source 07/10/19 1536 Temporal     SpO2 07/10/19 1536 96 %     Weight --      Height --      Head Circumference --      Peak Flow --      Pain Score 07/10/19 1533 4     Pain Loc  --      Pain Edu? --      Excl. in [place]? --    No data found.  Updated Vital Signs BP 119/64 (BP Location: Right Arm)    Pulse (!) 53    Temp 98.3 F (36.8 C) (Temporal)    Resp 16    SpO2 96%    Physical Exam Constitutional:  Appearance: He is well-developed.  HENT:     Head:     Comments: Bandage to bridge of nose, not removed for eval  Cardiovascular:     Rate and Rhythm: Normal rate.  Pulmonary:     Effort: Pulmonary effort is normal.  Musculoskeletal:     Comments: Ambulatory with walker with shuffled gait noted   Skin:    General: Skin is warm.  Neurological:     Mental Status: He is alert.     Comments: Wife answers majority of questions for patient      UC Treatments / Results  Labs (all labs ordered are listed, but only abnormal results are displayed) Labs Reviewed - No data to display  EKG   Radiology No results found.  Procedures Procedures (including critical care time)  Medications Ordered in UC Medications - No data to display  Initial Impression / Assessment and Plan / UC Course  I have reviewed the triage vital signs and the nursing notes.  Pertinent labs & imaging results that were available during my care of the patient were reviewed by me and considered in my medical decision making (see chart for details).     New onset headache after fall with known head injury. I do recommend further evaluation and treatment in the ER at this time. No current red flag findings. Patient transported by his wife. Patient's wife verbalized understanding and agreeable to plan.   Final Clinical Impressions(s) / UC Diagnoses   Final diagnoses:  Acute nonintractable headache, unspecified headache type  Fall, initial encounter     Discharge Instructions     Please go to the ER for further evaluation of your headache after a fall   ED Prescriptions    None     PDMP not reviewed this encounter.   Zigmund Gottron, NP 07/10/19 [phone]

## 2019-07-10 NOTE — Telephone Encounter (Signed)
Patient wife called.  She states he husband had a fall on Wednesday and hit face first onto the hardwood floor. She states he fall often because of parkinsons. He hit the bridge of his nose and has a laceration.  She has cleaned it and it is healing well.  Today he is C/O headache across his forehead and she is concerned that it may be injury from the fall. She is request an x-ray. Care advice given to patient wife and call placed to office. Per Vita Barley she will call patient with appointment or plan for care.  Mrs. Futrell verbalized understanding and will be waiting for call from office.  Reason for Disposition . [1] No prior tetanus shots (or is not fully vaccinated) AND [2] any wound (e.g., cut, scrape)  Answer Assessment - Initial Assessment Questions 1. MECHANISM: "How did the injury happen?" For falls, ask: "What height did you fall from?" and "What surface did you fall against?"      Wednesday,face first on hardwood floor 2. ONSET: "When did the injury happen?" (Minutes or hours ago)      wednesday 3. NEUROLOGIC SYMPTOMS: "Was there any loss of consciousness?" "Are there any other neurological symptoms?"      no 4. MENTAL STATUS: "Does the person know who he is, who you are, and where he is?"     none 5. LOCATION: "What part of the head was hit?"      Face down nose bleeding now having forehead pain 6. SCALP APPEARANCE: "What does the scalp look like? Is it bleeding now?" If so, ask: "Is it difficult to stop?"      no 7. SIZE: For cuts, bruises, or swelling, ask: "How large is it?" (e.g., inches or centimeters)      superficial 8. PAIN: "Is there any pain?" If so, ask: "How bad is it?"  (e.g., Scale 1-10; or mild, moderate, severe)     Headache 5 9. TETANUS: For any breaks in the skin, ask: "When was the last tetanus booster?"     2011 10. OTHER SYMPTOMS: "Do you have any other symptoms?" (e.g., neck pain, vomiting)     none 11. PREGNANCY: "Is there any chance you are pregnant?" "When  was your last menstrual period?"       N/A  Protocols used: HEAD INJURY-A-AH

## 2019-07-10 NOTE — Telephone Encounter (Signed)
Spoke with wife and gave recommendation per Dr Jerilee Hoh to go to ER/Urgent Care.  I attempted to call [facility], but was unable to get through.

## 2019-07-10 NOTE — Discharge Instructions (Signed)
Please go to the ER for further evaluation of your headache after a fall

## 2019-07-13 ENCOUNTER — Other Ambulatory Visit: Payer: Self-pay | Admitting: Internal Medicine

## 2019-07-14 ENCOUNTER — Emergency Department (HOSPITAL_COMMUNITY)
Admission: EM | Admit: 2019-07-14 | Discharge: 2019-07-14 | Disposition: A | Discharge status: Home / Self Care | Payer: Medicare Other | Attending: Emergency Medicine | Admitting: Emergency Medicine

## 2019-07-14 ENCOUNTER — Emergency Department (HOSPITAL_COMMUNITY): Payer: Medicare Other

## 2019-07-14 ENCOUNTER — Encounter (HOSPITAL_COMMUNITY): Payer: Self-pay | Admitting: Emergency Medicine

## 2019-07-14 ENCOUNTER — Telehealth: Payer: Self-pay | Admitting: Neurology

## 2019-07-14 DIAGNOSIS — S199XXA Unspecified injury of neck, initial encounter: Secondary | ICD-10-CM | POA: Diagnosis not present

## 2019-07-14 DIAGNOSIS — S0993XA Unspecified injury of face, initial encounter: Secondary | ICD-10-CM | POA: Diagnosis present

## 2019-07-14 DIAGNOSIS — Y999 Unspecified external cause status: Secondary | ICD-10-CM | POA: Diagnosis not present

## 2019-07-14 DIAGNOSIS — I10 Essential (primary) hypertension: Secondary | ICD-10-CM | POA: Diagnosis not present

## 2019-07-14 DIAGNOSIS — M542 Cervicalgia: Secondary | ICD-10-CM | POA: Insufficient documentation

## 2019-07-14 DIAGNOSIS — Z7982 Long term (current) use of aspirin: Secondary | ICD-10-CM | POA: Insufficient documentation

## 2019-07-14 DIAGNOSIS — Z85828 Personal history of other malignant neoplasm of skin: Secondary | ICD-10-CM | POA: Diagnosis not present

## 2019-07-14 DIAGNOSIS — Y9389 Activity, other specified: Secondary | ICD-10-CM | POA: Insufficient documentation

## 2019-07-14 DIAGNOSIS — Z23 Encounter for immunization: Secondary | ICD-10-CM | POA: Insufficient documentation

## 2019-07-14 DIAGNOSIS — G20 Parkinson's disease: Secondary | ICD-10-CM | POA: Insufficient documentation

## 2019-07-14 DIAGNOSIS — Y92009 Unspecified place in unspecified non-institutional (private) residence as the place of occurrence of the external cause: Secondary | ICD-10-CM | POA: Insufficient documentation

## 2019-07-14 DIAGNOSIS — Z79899 Other long term (current) drug therapy: Secondary | ICD-10-CM | POA: Diagnosis not present

## 2019-07-14 DIAGNOSIS — R519 Headache, unspecified: Secondary | ICD-10-CM | POA: Insufficient documentation

## 2019-07-14 DIAGNOSIS — E039 Hypothyroidism, unspecified: Secondary | ICD-10-CM | POA: Diagnosis not present

## 2019-07-14 DIAGNOSIS — W19XXXA Unspecified fall, initial encounter: Secondary | ICD-10-CM | POA: Diagnosis not present

## 2019-07-14 DIAGNOSIS — S0031XA Abrasion of nose, initial encounter: Secondary | ICD-10-CM | POA: Diagnosis not present

## 2019-07-14 DIAGNOSIS — S0083XA Contusion of other part of head, initial encounter: Secondary | ICD-10-CM | POA: Insufficient documentation

## 2019-07-14 DIAGNOSIS — S0990XA Unspecified injury of head, initial encounter: Secondary | ICD-10-CM | POA: Diagnosis not present

## 2019-07-14 MED ORDER — TETANUS-DIPHTH-ACELL PERTUSSIS 5-2.5-18.5 LF-MCG/0.5 IM SUSP
0.5000 mL | Freq: Once | INTRAMUSCULAR | Status: AC
  Administered 2019-07-14: 0.5 mL via INTRAMUSCULAR
  Filled 2019-07-14: qty 0.5

## 2019-07-14 NOTE — ED Provider Notes (Signed)
[facility] EMERGENCY DEPARTMENT Provider Note   CSN: [id number] Arrival date & time: 07/14/19  1224     History   Chief Complaint Chief Complaint  Patient presents with  . Fall    HPI [name] is a 73 y.o. male.     Patient is a 73 year old gentleman with past medical history of progressive supranuclear palsy, sleep apnea, hypertension presenting to the emergency department for headache after fall.  Patient reports that last Wednesday he had a mechanical fall in his house.  Reports that, due to his Parkinson's disease he often times has trouble walking.  He has a walker but had let go of the walker to reach out for something.  Reports that he fell forward and caught on his face on hardwood floor.  He did not pass out.  He was able to get up and resume with his day.  Reports the next day he began to have a headache in the frontal area of his head.  He does take aspirin.  He was advised to go to urgent care by his primary care doctor's office.  Urgent care advised him to come to the ED.  He reports that he waited 3 hours and left without being seen.  Returns today because he continues to have a headache.  Denies any vision changes, nausea, vomiting, memory changes.  No other injuries.     Past Medical History:  Diagnosis Date  . Allergic rhinitis   . Allergy   . Arthritis   . At risk for falls   . Cancer ([place])    basal cell skin cancer  . Cataract   . Complication of anesthesia    Slow to awaken  . Constipation   . DDD (degenerative disc disease), cervical   . Dysphagia   . Edema extremities 01/13/2016  . GERD (gastroesophageal reflux disease)    Decdreased, has decreased coffee intack.  . Glaucoma   . HTN (hypertension)   . Hx of adenomatous colonic polyps 01/23/2016  . Hyperlipidemia   . Hypogonadism male   . Hypothyroidism   . OSA (obstructive sleep apnea)    Has had surgery x 2  . Parkinsonism ([place]) 01/16/2013   no tremors, balance issues only  , speech and swallowing difficulties   . Pre-diabetes   . Sleep apnea    surgery x 2- no cpap  . Staph infection 10-2014  . Thyroid disease    HYPOTHYROID    Patient Active Problem List   Diagnosis Date Noted  . Hypothyroidism 02/18/2018  . Peripheral neuropathy 11/14/2016  . PSP (progressive supranuclear palsy) ([place]) 11/08/2016  . Hx of adenomatous colonic polyps 01/23/2016  . Edema extremities 01/13/2016  . Lumbar stenosis 06/06/2015  . HOCM (hypertrophic obstructive cardiomyopathy) ([place]) 12/12/2014  . Infection of right prepatellar bursa 10/18/2014  . DOE (dyspnea on exertion) 09/06/2014  . Fatigue due to excessive exertion 09/06/2014  . Testosterone deficiency 11/12/2013  . Routine general medical examination at a health care facility 08/30/2013  . Mixed hyperlipidemia 08/30/2013  . 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 Parkinsonism ([place]) 01/16/2013  . INSOMNIA 06/06/2008  . GLAUCOMA 06/01/2008  . Essential hypertension 06/01/2008  . Seasonal and perennial allergic rhinitis 06/01/2008    Past Surgical History:  Procedure Laterality Date  . CATARACT EXTRACTION    . COLONOSCOPY  04-27-2004   tics, hems   . I&D EXTREMITY Right 10/18/2014   Procedure: IRRIGATION AND DEBRIDEMENT RIGHT KNEE PREPATELLA BURSA INFECTION;  Surgeon: Beverely Low  Fernanda Drum, MD;  Location: [facility];  Service: Orthopedics;  Laterality: Right;  . [place] SURGERY  04-2015  . LUMBAR LAMINECTOMY WITH COFLEX 1 LEVEL N/A 06/06/2015   Procedure: Lumbar three-four Laminectomy/Foraminotomy with placement of Coflex;  Surgeon: [name], MD;  Location: [facility];  Service: Neurosurgery;  Laterality: N/A;  . NASAL SEPTUM SURGERY    . TONSILLECTOMY     and adenoids  . UVULOPALATOPHARYNGOPLASTY          Home Medications    Prior to Admission medications   Medication Sig Start Date End Date Taking? Authorizing Provider  acetaminophen (TYLENOL) 500 MG tablet Take 500 mg by mouth every 6 (six) hours as  needed.    [provider]  aspirin 81 MG tablet Take 81 mg by mouth daily.    [provider]  Cholecalciferol (VITAMIN D) 2000 units CAPS Take 1 capsule by mouth once a week.     [provider]  fexofenadine (ALLEGRA) 60 MG tablet Take 60 mg by mouth daily.     [provider]  finasteride (PROSCAR) 5 MG tablet Take 5 mg by mouth daily. 03/16/16   [provider]  fluticasone Asencion Islam) 50 MCG/ACT nasal spray Use 1 spray in each nostril daily 04/22/19   Isaac Bliss, Rayford Halsted, MD  levothyroxine (SYNTHROID) 50 MCG tablet TAKE 1 TABLET BY MOUTH EVERY DAY 07/14/19   Isaac Bliss, Rayford Halsted, MD  losartan (COZAAR) 100 MG tablet Take 1 tablet (100 mg total) by mouth daily. 05/26/19   Isaac Bliss, Rayford Halsted, MD  Multiple Vitamins-Minerals (MULTIVITAMIN WITH MINERALS) tablet Take 1 tablet by mouth daily.      [provider]  Naproxen Sodium (ALEVE) 220 MG CAPS Take by mouth.    [provider]  timolol (BETIMOL) 0.5 % ophthalmic solution Place 1 drop into both eyes 2 (two) times daily.     [provider]    Family History Family History  Problem Relation Age of Onset  . Emphysema Mother   . COPD Mother   . Heart disease Mother   . Hypertension Mother   . Liver cancer Father   . Cancer Father   . Diabetes Father   . Hypertension Father   . Colon polyps Father   . Heart disease Sister   . COPD Sister   . Heart attack Sister   . Hypertension Sister   . Colon polyps Brother   . Stroke Neg Hx   . Colon cancer Neg Hx     Social History Social History   Tobacco Use  . Smoking status: Never Smoker  . Smokeless tobacco: Never Used  Substance Use Topics  . Alcohol use: No    Alcohol/week: 0.0 standard drinks  . Drug use: No     Allergies   Ambien [zolpidem tartrate], Atenolol, Lunesta [eszopiclone], Oxazepam, Requip [ropinirole hcl], and Ziac [bisoprolol-hydrochlorothiazide]   Review of Systems Review  of Systems  Constitutional: Negative.   HENT: Negative for ear pain, facial swelling and nosebleeds.   Eyes: Negative for visual disturbance.  Respiratory: Negative for cough and shortness of breath.   Gastrointestinal: Negative for nausea and vomiting.  Genitourinary: Negative for dysuria.  Musculoskeletal: Negative.   Skin: Positive for color change (Bruising) and wound.  Neurological: Positive for headaches. Negative for dizziness, seizures, syncope, speech difficulty, weakness and light-headedness.  Hematological: Does not bruise/bleed easily.     Physical Exam Updated Vital Signs BP 119/80 (BP Location: Left Arm)  Pulse (!) 49   Temp 97.9 F (36.6 C) (Oral)   Resp 14   SpO2 96%   Physical Exam Vitals signs and nursing note reviewed.  Constitutional:      General: He is not in acute distress.    Appearance: Normal appearance. He is not ill-appearing, toxic-appearing or diaphoretic.  HENT:     Head: Normocephalic. Abrasion present. No raccoon eyes, Battle's sign, contusion, masses or laceration.     Jaw: There is normal jaw occlusion.     Right Ear: Tympanic membrane normal.     Left Ear: Tympanic membrane normal.     Nose:     Comments: Abrasion over the bridge of the nose.  No swelling or drainage or signs of infection    Mouth/Throat:     Mouth: Mucous membranes are moist.  Eyes:     Conjunctiva/sclera: Conjunctivae normal.  Neck:     Musculoskeletal: Full passive range of motion without pain. Normal range of motion. Muscular tenderness present. No injury, pain with movement or spinous process tenderness.  Cardiovascular:     Rate and Rhythm: Normal rate and regular rhythm.  Pulmonary:     Effort: Pulmonary effort is normal.  Musculoskeletal:     Comments: Patient is at baseline with all 4 extremities.  No signs of injury, trauma, deformity.  Skin:    General: Skin is dry.  Neurological:     Mental Status: He is alert.  Psychiatric:        Mood and Affect:  Mood normal.      ED Treatments / Results  Labs (all labs ordered are listed, but only abnormal results are displayed) Labs Reviewed - No data to display  EKG None  Radiology No results found.  Procedures Procedures (including critical care time)  Medications Ordered in ED Medications  Tdap (BOOSTRIX) injection 0.5 mL (0.5 mLs Intramuscular Given 07/14/19 1410)     Initial Impression / Assessment and Plan / ED Course  I have reviewed the triage vital signs and the nursing notes.  Pertinent labs & imaging results that were available during my care of the patient were reviewed by me and considered in my medical decision making (see chart for details).  Clinical Course as of Jul 13 1538  Tue Jul 14, 2019  1407 Elderly patient with mechanical fall one week ago, still having headache, on aspirin. Head and Cspine CT ordered. No new neuro findings on exam. Tetanus updated due to abrasion on the nose. Patient declines pain medications at this time. If workup negative, send home. Patient care signed out to Margarita Mail PA due to change of shift   [KM]    Clinical Course User Index [KM] Alveria Apley, PA-C         Final Clinical Impressions(s) / ED Diagnoses   Final diagnoses:  Fall, initial encounter  Contusion of face, initial encounter    ED Discharge Orders    None       Kristine Royal 07/14/19 [place], MD 07/15/19 [phone]

## 2019-07-14 NOTE — Telephone Encounter (Signed)
Pt wife(on DPR from 2015) has called to inform that pt fell a week ago on his forehead and has been complaining of a headache in that area.  Wife states she has tried to have him seen at his PCP and urgent care, they wont see him. Wife states they spent 1/2 a day in the ED and he wasn't seen.  Wife was offered 1st available for Dr Rexene Alberts (2 time frames on Thurs.) Wife declined both stating she wants pt seen today since this took place over a week ago. Wife is asking for a call from RN

## 2019-07-14 NOTE — Telephone Encounter (Signed)
I spoke with Dr. Rexene Alberts. She insists that pt go to ER if he has fallen.  I called pt, spoke to pt's wife, [name], per DPR. She explains that pt fell on Wednesday and hit his head, striking it against a hard floor. His nose was bloodied at the bridge. They proceeded to their PCP for a flu shot. Pt's wife reports that PCP refused to see him for the fall but told him to go to [facility] or ER. Pt went to UC and they recommended pt go to ER. Pt's wife reports that they waited for over 5 hours in the ER and were never seen because pt said he could not wait any longer. Pt's wife reports that there are no COVID precautions being taken seriously in the ER and she was concerned for their safety because of this. Since the fall pt has had a headache for several days, pt's wife wants pt to have an appt today with Dr. Rexene Alberts.  I explained to pt's wife that Dr. Rexene Alberts insists that pt go to the ER immediately. Pt's wife became very upset at this and was crying so hard that it was difficult to understand her. She asked why we couldn't order an MRI to be done at an outpatient center. I advised her that it is my understanding that insurance authorization for those scans must be obtained before the outpatient centers can do them because of their high cost, and the imaging centers may not accept/be able to accommodate short notice scheduling. This would delay pt's care. I reiterated the need for pt to go to the ER and be seen for this fall and headache afterwards STAT. Pt's wife said "ok." I did offer pt a sooner appt after his ER work up but the pt's wife discontinued the call.

## 2019-07-14 NOTE — ED Provider Notes (Signed)
3:44 PM BP 119/80 (BP Location: Left Arm)   Pulse (!) 49   Temp 97.9 F (36.6 C) (Oral)   Resp 14   SpO2 96%  Patient with HX of Parkinson's, mechanical fall 1 week ago. The tdap updated. Currently awaiting CT head and Cspine.    I have reviewed the patient's CT head and c-spine. Patient has no apparent acute abnormalities.  Patient will be discharged follow-up with his PCP.       Margarita Mail, PA-C 07/14/19 1700    Lacretia Leigh, MD 07/15/19 [phone]

## 2019-07-14 NOTE — ED Triage Notes (Signed)
Pt arrives with wife with c/o of fall 1 week ago - woke up with a headache that next day and was seen in ER on 10/2 but due to long wait had to leave. Wife brought him back today due to constant headache seen fall. Wife states pt is at her baseline.

## 2019-07-14 NOTE — Discharge Instructions (Signed)
Thank you for allowing me to care for you today. Please return to the emergency department if you have new or worsening symptoms.   

## 2019-07-14 NOTE — Telephone Encounter (Signed)
Thank you for talking to the wife and explaining this so well to the patient's wife. As we discussed, unfortunately, there is really nothing else I can do or offer them.  He has to get checked out ASAP and in urgent care setting is unfortunately not the right setting, he has to go to the emergency room.  We can offer them a appointment soon with NP of myself, after he has been seen in the emergency room.

## 2019-07-15 NOTE — Telephone Encounter (Signed)
Noted, thank you

## 2019-07-15 NOTE — Telephone Encounter (Signed)
I called pt's wife, [name], per DPR. She reports that pt went to ER and his head and neck scans were ok. Pt's wife declined a f/u with Dr. Rexene Alberts at this time. Pt's wife is very relieved that pt did not have bleeding on his brain. I reminded pt's wife that falls with a strike to the head should always be seen in the ER. Pt's wife verbalized understanding and appreciation.

## 2019-09-10 ENCOUNTER — Telehealth: Payer: Self-pay | Admitting: Internal Medicine

## 2019-09-10 NOTE — Telephone Encounter (Signed)
Fine with me

## 2019-09-10 NOTE — Telephone Encounter (Signed)
°  Are you ok with transfer?  Copied from [place] [phone]. Topic: Appointment Scheduling - Transfer of Care >> Sep 10, 2019  9:44 AM Rayann Heman wrote: Pt is requesting to transfer FROM: Dr Jerilee Hoh  Pt is requesting to transfer TO: Dr Jonni Sanger  Reason for requested transfer: [facility] and this would be easier with his care. Please advise   Send CRM to patient's current PCP (transferring FROM).

## 2019-09-10 NOTE — Telephone Encounter (Signed)
Please call and schedule TOC appointment with Dr.Andy.

## 2019-09-10 NOTE — Telephone Encounter (Signed)
Ok with me 

## 2019-09-25 ENCOUNTER — Encounter: Payer: Self-pay | Admitting: Family Medicine

## 2019-09-25 ENCOUNTER — Ambulatory Visit (INDEPENDENT_AMBULATORY_CARE_PROVIDER_SITE_OTHER): Payer: Medicare Other | Admitting: Family Medicine

## 2019-09-25 ENCOUNTER — Other Ambulatory Visit: Payer: Self-pay

## 2019-09-25 VITALS — BP 132/82 | HR 54 | Temp 98.2°F | Ht 70.0 in | Wt 182.8 lb

## 2019-09-25 DIAGNOSIS — I421 Obstructive hypertrophic cardiomyopathy: Secondary | ICD-10-CM | POA: Diagnosis not present

## 2019-09-25 DIAGNOSIS — E782 Mixed hyperlipidemia: Secondary | ICD-10-CM | POA: Diagnosis not present

## 2019-09-25 DIAGNOSIS — I10 Essential (primary) hypertension: Secondary | ICD-10-CM

## 2019-09-25 DIAGNOSIS — G231 Progressive supranuclear ophthalmoplegia [Steele-Richardson-Olszewski]: Secondary | ICD-10-CM | POA: Diagnosis not present

## 2019-09-25 DIAGNOSIS — E039 Hypothyroidism, unspecified: Secondary | ICD-10-CM

## 2019-09-25 NOTE — Patient Instructions (Signed)
Please return in 6 months for recheck.  I will release your lab results to you on your MyChart account with further instructions. Please reply with any questions.   Stay safe and Happy Holidays! Call me if you need me.  It was a pleasure meeting you today! Thank you for choosing [place] to meet your healthcare needs! I truly look forward to working with you. If you have any questions or concerns, please send me a message via Mychart or call the office at [phone].

## 2019-09-25 NOTE — Progress Notes (Signed)
Subjective  CC:  Chief Complaint  Patient presents with  . Transitions Of Care    from Dr. Reather Converse  . Hypertension  . Hyperlipidemia    HPI: [name] is a 73 y.o. male who presents to [facility] today to establish care with me as a new patient.  I've reviewed chart, old records, notes from specialist, and update PL accordingly. I discussed his care with his wife and pt together.  He has the following concerns or needs:  Very pleasant 73 yo male with SNP with several complications including poor balance and frequent falls, weakness, dysphagia, dysarthria, and cognitive problems who lives with his wife. She cares for him exclusively now due to covid: his daughters have been able to help more in the past but due to the risk of covid, their help is more limited. He is managed by neurology locally and at [facility]. He has failed levidopamine; he has multiple falls and eats a dysphagia diet. His wife is comfortable with the current living arrangement. Both deny sxs of depression.   Hypthyroidism on meds. Last checked in may 2019.   HLD on statin. Tolerates well.   H/o adenomatous polyps; no further screens recommended at this time per Dr. Carlean Purl. Due to comorbidities.   Hypertension f/u: Control is good . Pt reports he is doing well. taking medications as instructed, no medication side effects noted, no TIAs, no chest pain on exertion, no dyspnea on exertion, no swelling of ankles. He denies adverse effects from his BP medications. Compliance with medication is good.  ROS: + constipation  BP Readings from Last 3 Encounters:  09/25/19 132/82  07/14/19 136/77  07/10/19 123/70   Wt Readings from Last 3 Encounters:  09/25/19 182 lb 12.8 oz (82.9 kg)  06/30/19 187 lb (84.8 kg)  04/22/19 186 lb 12.8 oz (84.7 kg)    Lab Results  Component Value Date   CHOL 174 02/18/2018   CHOL 185 09/30/2015   CHOL 186 09/28/2014   Lab Results  Component Value  Date   HDL 37.80 (L) 02/18/2018   HDL 38.90 (L) 09/30/2015   HDL 33.30 (L) 09/28/2014   Lab Results  Component Value Date   LDLCALC 108 (H) 09/30/2015   LDLCALC 124 (H) 09/28/2014   LDLCALC 100 (H) 08/31/2013   Lab Results  Component Value Date   TRIG 235.0 (H) 02/18/2018   TRIG 191.0 (H) 09/30/2015   TRIG 143.0 09/28/2014   Lab Results  Component Value Date   CHOLHDL 5 02/18/2018   CHOLHDL 5 09/30/2015   CHOLHDL 6 09/28/2014   Lab Results  Component Value Date   LDLDIRECT 107.0 02/18/2018   Lab Results  Component Value Date   CREATININE 0.74 02/18/2018   BUN 10 02/18/2018   NA 143 02/18/2018   K 4.7 02/18/2018   CL 104 02/18/2018   CO2 28 02/18/2018    The 10-year ASCVD risk score Mikey Bussing DC Jr., et al., 2013) is: 44.2%   Values used to calculate the score:     Age: 38 years     Sex: Male     Is Non-Hispanic African American: No     Diabetic: Yes     Tobacco smoker: No     Systolic Blood Pressure: [id number] mmHg     Is BP treated: Yes     HDL Cholesterol: 37.8 mg/dL     Total Cholesterol: 174 mg/dL  Assessment  1. PSP (progressive supranuclear palsy) ([place])  2. Essential hypertension   3. Mixed hyperlipidemia   4. HOCM (hypertrophic obstructive cardiomyopathy) ([place])   5. Acquired hypothyroidism      Plan   PSP: per neuro. Progressive but fortunately slowly progressing. Continue fall prevention and dysphagia diet. Will need awv for future planning and to clarify advance directives.   HTN is controlled. Check renal function and electrolytes.  HLD: recheck lfts on statin and ldl  HOCM per cards. Stable   Hypothyroidism: overdue for recheck   Follow up:  No follow-ups on file. Orders Placed This Encounter  Procedures  . CBC w/Diff  . CMP  . Lipids  . TSH   No orders of the defined types were placed in this encounter.    Depression screen [facility] 2/9 04/22/2019 01/15/2018 10/18/2016 09/30/2015 09/28/2014  Decreased Interest 0 0 0 0 0  Down, Depressed,  Hopeless 0 0 0 0 0  PHQ - 2 Score 0 0 0 0 0  Altered sleeping 0 - - - -  Tired, decreased energy 0 - - - -  Change in appetite 0 - - - -  Feeling bad or failure about yourself  0 - - - -  Trouble concentrating 0 - - - -  Moving slowly or fidgety/restless 0 - - - -  Suicidal thoughts 0 - - - -  PHQ-9 Score 0 - - - -  Difficult doing work/chores Not difficult at all - - - -  Some recent data might be hidden    We updated and reviewed the patient's past history in detail and it is documented below.  Patient Active Problem List   Diagnosis Date Noted  . Hypothyroidism 02/18/2018  . Peripheral neuropathy 11/14/2016  . PSP (progressive supranuclear palsy) ([place]) 11/08/2016  . Tubular adenoma of colon 01/23/2016    01/2016 - 4 small adenomas - recall 2020 UPDATE - NO RECALL DUE TO CO-MORBIDITIES AND TERMINAL ILLNESS W/ PROGRESSIVE SUPRANUCLEAR PALSY HE CANNOT TOLERATE CLEAR LIQUIDS EITHER SO CANNOT PREP Gatha Mayer, MD, Marval Regal 02/09/2019     . Lumbar stenosis 06/06/2015  . HOCM (hypertrophic obstructive cardiomyopathy) ([place]) 12/12/2014  . Testosterone deficiency 11/12/2013  . Mixed hyperlipidemia 08/30/2013  . 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 Parkinsonism ([place]) 01/16/2013  . GLAUCOMA 06/01/2008    Qualifier: Diagnosis of  By: Julien Girt CMA, Marliss Czar     . Essential hypertension 06/01/2008    Qualifier: Diagnosis of  By: Julien Girt CMA, Marliss Czar     . Seasonal and perennial allergic rhinitis 06/01/2008    Suspect allergic and nonallergic rhinitis      Health Maintenance  Topic Date Due  . OPHTHALMOLOGY EXAM  09/23/2020  . TETANUS/TDAP  07/13/2029  . INFLUENZA VACCINE  Completed  . Hepatitis C Screening  Completed  . PNA vac Low Risk Adult  Completed  . HEMOGLOBIN A1C  Discontinued   Immunization History  Administered Date(s) Administered  . Fluad Quad(high Dose 65+) 07/08/2019  . Influenza Whole 08/29/2010, 07/09/2011, 07/21/2012  . Influenza, High Dose Seasonal PF  07/24/2013, 07/15/2015, 06/23/2016, 07/04/2017  . Influenza-Unspecified 07/23/2014, 07/04/2017, 07/08/2018  . Pneumococcal Conjugate-13 05/13/2014  . Pneumococcal Polysaccharide-23 10/08/2002, 08/12/2015  . Tdap 07/08/2010, 07/14/2019  . Zoster 07/09/2007   Current Meds  Medication Sig  . aspirin 81 MG tablet Take 81 mg by mouth daily.  . Cholecalciferol (VITAMIN D) 2000 units CAPS Take 1 capsule by mouth once a week.   . fexofenadine (ALLEGRA) 60 MG tablet Take 60 mg by mouth daily.   Marland Kitchen  finasteride (PROSCAR) 5 MG tablet Take 5 mg by mouth daily.  . fluticasone (FLONASE) 50 MCG/ACT nasal spray Use 1 spray in each nostril daily  . levothyroxine (SYNTHROID) 50 MCG tablet TAKE 1 TABLET BY MOUTH EVERY DAY  . losartan (COZAAR) 100 MG tablet Take 1 tablet (100 mg total) by mouth daily.  . Multiple Vitamins-Minerals (MULTIVITAMIN WITH MINERALS) tablet Take 1 tablet by mouth daily.    . Naproxen Sodium (ALEVE) 220 MG CAPS Take by mouth.  . timolol (BETIMOL) 0.5 % ophthalmic solution Place 1 drop into both eyes 2 (two) times daily.     Allergies: Patient is allergic to [employer] tartrate]; atenolol; lunesta [eszopiclone]; oxazepam; requip [ropinirole hcl]; and ziac [bisoprolol-hydrochlorothiazide]. Past Medical History Patient  has a past medical history of Allergic rhinitis, Allergy, Arthritis, At risk for falls, Cancer ([place]), Cataract, Complication of anesthesia, Constipation, DDD (degenerative disc disease), cervical, Dysphagia, Edema extremities (01/13/2016), GERD (gastroesophageal reflux disease), Glaucoma, HTN (hypertension), adenomatous colonic polyps (01/23/2016), Hyperlipidemia, Hypogonadism male, Hypothyroidism, OSA (obstructive sleep apnea), Parkinsonism ([place]) (01/16/2013), Pre-diabetes, Sleep apnea, Staph infection (10-2014), and Thyroid disease. Past Surgical History Patient  has a past surgical history that includes Uvulopalatopharyngoplasty; Lumbar disc surgery (04-2015); Cataract  extraction; I & D extremity (Right, 10/18/2014); Tonsillectomy; Nasal septum surgery; Lumbar laminectomy with coflex 1 level (N/A, 06/06/2015); and Colonoscopy (04-27-2004). Family History: Patient family history includes COPD in his mother and sister; Cancer in his father; Colon polyps in his brother and father; Diabetes in his father; Emphysema in his mother; Heart attack in his sister; Heart disease in his mother and sister; Hypertension in his father, mother, and sister; Liver cancer in his father. Social History:  Patient  reports that he has never smoked. He has never used smokeless tobacco. He reports that he does not drink alcohol or use drugs.  Review of Systems: Constitutional: negative for fever or malaise Ophthalmic: negative for photophobia, double vision or loss of vision Cardiovascular: negative for chest pain, dyspnea on exertion, or new LE swelling Respiratory: negative for SOB or persistent cough Gastrointestinal: negative for abdominal pain, change in bowel habits or melena Genitourinary: negative for dysuria or gross hematuria Musculoskeletal:positive new gait disturbance or muscular weakness Integumentary: negative for new or persistent rashes Neurological: negative for TIA or stroke symptoms Psychiatric: negative for SI or delusions Allergic/Immunologic: negative for hives  Patient Care Team    Relationship Specialty Notifications Start End  [name], MD PCP - General Family Medicine  09/25/19   Sueanne Margarita, MD PCP - Cardiology Cardiology Admissions 05/06/18   Bjorn Loser, MD Consulting Physician Urology  09/25/19   Shon Hough, MD Consulting Physician Ophthalmology  09/25/19   Star Age, MD Attending Physician Neurology  09/25/19     Objective  Vitals: BP 132/82 (BP Location: Left Arm, Patient Position: Sitting, Cuff Size: Normal)   Pulse (!) 54   Temp 98.2 F (36.8 C) (Temporal)   Ht 5\' 10"  (1.778 m)   Wt 182 lb 12.8 oz (82.9 kg)   SpO2  92%   BMI 26.23 kg/m  General:  Sitting comfortably, no acute distress  Psych:  Alert and oriented,masked facies, appropriate HEENT:  Normocephalic, atraumatic, non-icteric sclera,  Cardiovascular:  RRR without gallop, rub or murmur,  Respiratory:  Good breath sounds bilaterally, CTAB with normal respiratory effort Gastrointestinal: normal bowel sounds, soft, non-tender, no noted masses. No HSM MSK: no deformities, contusions. Joints are without erythema or swelling Skin:  Warm, no rashes or suspicious lesions noted Neurologic:  Mental status is normal. dysarthric  Commons side effects, risks, benefits, and alternatives for medications and treatment plan prescribed today were discussed, and the patient expressed understanding of the given instructions. Patient is instructed to call or message via MyChart if he/she has any questions or concerns regarding our treatment plan. No barriers to understanding were identified. We discussed Red Flag symptoms and signs in detail. Patient expressed understanding regarding what to do in case of urgent or emergency type symptoms.   Medication list was reconciled, printed and provided to the patient in AVS. Patient instructions and summary information was reviewed with the patient as documented in the AVS. This note was prepared with assistance of Dragon voice recognition software. Occasional wrong-word or sound-a-like substitutions may have occurred due to the inherent limitations of voice recognition software  This visit occurred during the SARS-CoV-2 public health emergency.  Safety protocols were in place, including screening questions prior to the visit, additional usage of staff PPE, and extensive cleaning of exam room while observing appropriate contact time as indicated for disinfecting solutions.

## 2019-09-26 LAB — COMPREHENSIVE METABOLIC PANEL
AG Ratio: 2 (calc) (ref 1.0–2.5)
ALT: 20 U/L (ref 9–46)
AST: 18 U/L (ref 10–35)
Albumin: 4.5 g/dL (ref 3.6–5.1)
Alkaline phosphatase (APISO): 65 U/L (ref 35–144)
BUN: 12 mg/dL (ref 7–25)
CO2: 25 mmol/L (ref 20–32)
Calcium: 9.5 mg/dL (ref 8.6–10.3)
Chloride: 102 mmol/L (ref 98–110)
Creat: 0.73 mg/dL (ref 0.70–1.18)
Globulin: 2.2 g/dL (calc) (ref 1.9–3.7)
Glucose, Bld: 97 mg/dL (ref 65–99)
Potassium: 4.4 mmol/L (ref 3.5–5.3)
Sodium: 139 mmol/L (ref 135–146)
Total Bilirubin: 0.6 mg/dL (ref 0.2–1.2)
Total Protein: 6.7 g/dL (ref 6.1–8.1)

## 2019-09-26 LAB — LIPID PANEL
Cholesterol: 158 mg/dL (ref <200)
HDL: 38 mg/dL — ABNORMAL LOW (ref >=40)
LDL Cholesterol (Calc): 96 mg/dL (calc)
Non-HDL Cholesterol (Calc): 120 mg/dL (calc) (ref <130)
Total CHOL/HDL Ratio: 4.2 (calc) (ref <5.0)
Triglycerides: 139 mg/dL (ref <150)

## 2019-09-26 LAB — CBC WITH DIFFERENTIAL/PLATELET
Absolute Monocytes: 591 cells/uL (ref 200–950)
Basophils Absolute: 51 cells/uL (ref 0–200)
Basophils Relative: 0.7 %
Eosinophils Absolute: 1051 cells/uL — ABNORMAL HIGH (ref 15–500)
Eosinophils Relative: 14.4 %
HCT: 45.5 % (ref 38.5–50.0)
Hemoglobin: 15.7 g/dL (ref 13.2–17.1)
Lymphs Abs: 1796 cells/uL (ref 850–3900)
MCH: 31.3 pg (ref 27.0–33.0)
MCHC: 34.5 g/dL (ref 32.0–36.0)
MCV: 90.6 fL (ref 80.0–100.0)
MPV: 9 fL (ref 7.5–12.5)
Monocytes Relative: 8.1 %
Neutro Abs: 3811 cells/uL (ref 1500–7800)
Neutrophils Relative %: 52.2 %
Platelets: 237 10*3/uL (ref 140–400)
RBC: 5.02 10*6/uL (ref 4.20–5.80)
RDW: 12.9 % (ref 11.0–15.0)
Total Lymphocyte: 24.6 %
WBC: 7.3 10*3/uL (ref 3.8–10.8)

## 2019-09-26 LAB — TSH: TSH: 2.42 mIU/L (ref 0.40–4.50)

## 2019-11-23 ENCOUNTER — Ambulatory Visit: Payer: Medicare Other | Attending: Internal Medicine

## 2019-11-23 DIAGNOSIS — Z23 Encounter for immunization: Secondary | ICD-10-CM | POA: Insufficient documentation

## 2019-11-23 NOTE — Progress Notes (Signed)
   Covid-19 Vaccination Clinic  Name:  [name]    MRN: [id number] DOB: [birth year]  11/23/2019  [name] was observed post Covid-19 immunization for 15 minutes without incidence. He was provided with Vaccine Information Sheet and instruction to access the V-Safe system.   [name] was instructed to call 911 with any severe reactions post vaccine: Marland Kitchen Difficulty breathing  . Swelling of your face and throat  . A fast heartbeat  . A bad rash all over your body  . Dizziness and weakness    Immunizations Administered    Name Date Dose VIS Date Route   Moderna COVID-19 Vaccine 11/23/2019 12:28 PM 0.5 mL 09/08/2019 Intramuscular   Manufacturer: Moderna   Lot: [id number]   [id number]

## 2019-11-30 ENCOUNTER — Encounter: Payer: Self-pay | Admitting: Neurology

## 2019-11-30 ENCOUNTER — Other Ambulatory Visit: Payer: Self-pay

## 2019-11-30 ENCOUNTER — Ambulatory Visit (INDEPENDENT_AMBULATORY_CARE_PROVIDER_SITE_OTHER): Payer: Medicare Other | Admitting: Neurology

## 2019-11-30 VITALS — BP 134/76 | HR 49 | Ht 70.0 in | Wt 179.0 lb

## 2019-11-30 DIAGNOSIS — G231 Progressive supranuclear ophthalmoplegia [Steele-Richardson-Olszewski]: Secondary | ICD-10-CM

## 2019-11-30 DIAGNOSIS — Z9181 History of falling: Secondary | ICD-10-CM
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
## 2019-11-30 NOTE — Patient Instructions (Signed)
Your weight is indeed less compared to last year around this time, almost 10 pounds less.  Please continue to hydrate well with water, sit completely upright and take small sips and small bites, try to eat nutritious food.  Fall prevention is going to be of utmost importance, use your walker at all times.  We will seek neuro rehab evaluations soon, hopefully once you have had your second Covid vaccine dose successfully next month.  I will put an order in at the time, please call for the referrals to be placed.  I plan to see you back in 1 year routinely.

## 2019-12-05 ENCOUNTER — Other Ambulatory Visit: Payer: Self-pay | Admitting: Internal Medicine

## 2019-12-22 ENCOUNTER — Ambulatory Visit: Payer: Medicare Other | Attending: Internal Medicine

## 2019-12-22 DIAGNOSIS — Z23 Encounter for immunization: Secondary | ICD-10-CM

## 2019-12-22 NOTE — Progress Notes (Signed)
   Covid-19 Vaccination Clinic  Name:  [name]    MRN: [id number] DOB: [birth year]  12/22/2019  Mr. [name] was observed post Covid-19 immunization for 15 minutes without incident. He was provided with Vaccine Information Sheet and instruction to access the V-Safe system.   Mr. [name] was instructed to call 911 with any severe reactions post vaccine: Marland Kitchen Difficulty breathing  . Swelling of face and throat  . A fast heartbeat  . A bad rash all over body  . Dizziness and weakness   Immunizations Administered    Name Date Dose VIS Date Route   Moderna COVID-19 Vaccine 12/22/2019 12:44 PM 0.5 mL 09/08/2019 Intramuscular   Manufacturer: Moderna   Lot: [id number]   OsoBE:[id number]

## 2020-01-06 ENCOUNTER — Other Ambulatory Visit: Payer: Self-pay | Admitting: Internal Medicine

## 2020-01-06 DIAGNOSIS — J302 Other seasonal allergic rhinitis: Secondary | ICD-10-CM

## 2020-01-08 ENCOUNTER — Other Ambulatory Visit: Payer: Self-pay | Admitting: Family Medicine

## 2020-01-08 DIAGNOSIS — J302 Other seasonal allergic rhinitis: Secondary | ICD-10-CM

## 2020-01-11 ENCOUNTER — Ambulatory Visit (INDEPENDENT_AMBULATORY_CARE_PROVIDER_SITE_OTHER): Payer: Medicare Other

## 2020-01-11 ENCOUNTER — Other Ambulatory Visit: Payer: Self-pay

## 2020-01-11 DIAGNOSIS — Z0000 Encounter for general adult medical examination without abnormal findings: Secondary | ICD-10-CM

## 2020-01-11 NOTE — Patient Instructions (Signed)
[name] , Thank you for taking time to come for your Medicare Wellness Visit. I appreciate your ongoing commitment to your health goals. Please review the following plan we discussed and let me know if I can assist you in the future.   Screening recommendations/referrals: Colorectal Screening: up to date; last colonoscopy 01/16/16  Vision and Dental Exams: Recommended annual ophthalmology exams for early detection of glaucoma and other disorders of the eye Recommended annual dental exams for proper oral hygiene  Vaccinations: Influenza vaccine: completed 07/08/19 Pneumococcal vaccine: up to date; last 08/12/15 Tdap vaccine: up to date; last 07/14/19 Shingles vaccine: You may receive this vaccine at your local pharmacy. (see handout)  Covid vaccine:  Completed   Advanced directives: Please bring a copy of your POA (Power of Attorney) and/or Living Will to your next appointment.  Goals: Recommend to drink at least 6-8 8oz glasses of water per day and consume a balanced diet rich in fresh fruits and vegetables.   Recommend to remove any items from the home that may cause slips or trips.  Next appointment: Please schedule your Annual Wellness Visit with your Nurse Health Advisor in one year.  Preventive Care 35 Years and Older, Male Preventive care refers to lifestyle choices and visits with your health care provider that can promote health and wellness. What does preventive care include?  A yearly physical exam. This is also called an annual well check.  Dental exams once or twice a year.  Routine eye exams. Ask your health care provider how often you should have your eyes checked.  Personal lifestyle choices, including:  Daily care of your teeth and gums.  Regular physical activity.  Eating a healthy diet.  Avoiding tobacco and drug use.  Limiting alcohol use.  Practicing safe sex.  Taking low doses of aspirin every day if recommended by your health care  provider..  Taking vitamin and mineral supplements as recommended by your health care provider. What happens during an annual well check? The services and screenings done by your health care provider during your annual well check will depend on your age, overall health, lifestyle risk factors, and family history of disease. Counseling  Your health care provider may ask you questions about your:  Alcohol use.  Tobacco use.  Drug use.  Emotional well-being.  Home and relationship well-being.  Sexual activity.  Eating habits.  History of falls.  Memory and ability to understand (cognition).  Work and work Statistician. Screening  You may have the following tests or measurements:  Height, weight, and BMI.  Blood pressure.  Lipid and cholesterol levels. These may be checked every 5 years, or more frequently if you are over 46 years old.  Skin check.  Lung cancer screening. You may have this screening every year starting at age 65 if you have a 30-pack-year history of smoking and currently smoke or have quit within the past 15 years.  Fecal occult blood test (FOBT) of the stool. You may have this test every year starting at age 74.  Flexible sigmoidoscopy or colonoscopy. You may have a sigmoidoscopy every 5 years or a colonoscopy every 10 years starting at age 92.  Prostate cancer screening. Recommendations will vary depending on your family history and other risks.  Hepatitis C blood test.  Hepatitis B blood test.  Sexually transmitted disease (STD) testing.  Diabetes screening. This is done by checking your blood sugar (glucose) after you have not eaten for a while (fasting). You may have this done  every 1-3 years.  Abdominal aortic aneurysm (AAA) screening. You may need this if you are a current or former smoker.  Osteoporosis. You may be screened starting at age 48 if you are at high risk. Talk with your health care provider about your test results, treatment  options, and if necessary, the need for more tests. Vaccines  Your health care provider may recommend certain vaccines, such as:  Influenza vaccine. This is recommended every year.  Tetanus, diphtheria, and acellular pertussis (Tdap, Td) vaccine. You may need a Td booster every 10 years.  Zoster vaccine. You may need this after age 55.  Pneumococcal 13-valent conjugate (PCV13) vaccine. One dose is recommended after age 73.  Pneumococcal polysaccharide (PPSV23) vaccine. One dose is recommended after age 65. Talk to your health care provider about which screenings and vaccines you need and how often you need them. This information is not intended to replace advice given to you by your health care provider. Make sure you discuss any questions you have with your health care provider. Document Released: 10/21/2015 Document Revised: 06/13/2016 Document Reviewed: 07/26/2015 Elsevier Interactive Patient Education  2017 [place] Prevention in the Home Falls can cause injuries. They can happen to people of all ages. There are many things you can do to make your home safe and to help prevent falls. What can I do on the outside of my home?  Regularly fix the edges of walkways and driveways and fix any cracks.  Remove anything that might make you trip as you walk through a door, such as a raised step or threshold.  Trim any bushes or trees on the path to your home.  Use bright outdoor lighting.  Clear any walking paths of anything that might make someone trip, such as rocks or tools.  Regularly check to see if handrails are loose or broken. Make sure that both sides of any steps have handrails.  Any raised decks and porches should have guardrails on the edges.  Have any leaves, snow, or ice cleared regularly.  Use sand or salt on walking paths during winter.  Clean up any spills in your garage right away. This includes oil or grease spills. What can I do in the  bathroom?  Use night lights.  Install grab bars by the toilet and in the tub and shower. Do not use towel bars as grab bars.  Use non-skid mats or decals in the tub or shower.  If you need to sit down in the shower, use a plastic, non-slip stool.  Keep the floor dry. Clean up any water that spills on the floor as soon as it happens.  Remove soap buildup in the tub or shower regularly.  Attach bath mats securely with double-sided non-slip rug tape.  Do not have throw rugs and other things on the floor that can make you trip. What can I do in the bedroom?  Use night lights.  Make sure that you have a light by your bed that is easy to reach.  Do not use any sheets or blankets that are too big for your bed. They should not hang down onto the floor.  Have a firm chair that has side arms. You can use this for support while you get dressed.  Do not have throw rugs and other things on the floor that can make you trip. What can I do in the kitchen?  Clean up any spills right away.  Avoid walking on wet floors.  Keep  items that you use a lot in easy-to-reach places.  If you need to reach something above you, use a strong step stool that has a grab bar.  Keep electrical cords out of the way.  Do not use floor polish or wax that makes floors slippery. If you must use wax, use non-skid floor wax.  Do not have throw rugs and other things on the floor that can make you trip. What can I do with my stairs?  Do not leave any items on the stairs.  Make sure that there are handrails on both sides of the stairs and use them. Fix handrails that are broken or loose. Make sure that handrails are as long as the stairways.  Check any carpeting to make sure that it is firmly attached to the stairs. Fix any carpet that is loose or worn.  Avoid having throw rugs at the top or bottom of the stairs. If you do have throw rugs, attach them to the floor with carpet tape.  Make sure that you have a  light switch at the top of the stairs and the bottom of the stairs. If you do not have them, ask someone to add them for you. What else can I do to help prevent falls?  Wear shoes that:  Do not have high heels.  Have rubber bottoms.  Are comfortable and fit you well.  Are closed at the toe. Do not wear sandals.  If you use a stepladder:  Make sure that it is fully opened. Do not climb a closed stepladder.  Make sure that both sides of the stepladder are locked into place.  Ask someone to hold it for you, if possible.  Clearly mark and make sure that you can see:  Any grab bars or handrails.  First and last steps.  Where the edge of each step is.  Use tools that help you move around (mobility aids) if they are needed. These include:  Canes.  Walkers.  Scooters.  Crutches.  Turn on the lights when you go into a dark area. Replace any light bulbs as soon as they burn out.  Set up your furniture so you have a clear path. Avoid moving your furniture around.  If any of your floors are uneven, fix them.  If there are any pets around you, be aware of where they are.  Review your medicines with your doctor. Some medicines can make you feel dizzy. This can increase your chance of falling. Ask your doctor what other things that you can do to help prevent falls. This information is not intended to replace advice given to you by your health care provider. Make sure you discuss any questions you have with your health care provider. Document Released: 07/21/2009 Document Revised: 03/01/2016 Document Reviewed: 10/29/2014 Elsevier Interactive Patient Education  2017 [employer].

## 2020-01-11 NOTE — Progress Notes (Signed)
This visit is being conducted via phone call due to the COVID-19 pandemic. This patient has given me verbal consent via phone to conduct this visit, patient states they are participating from their home address. Some vital signs may be absent or patient reported.   Patient identification: identified by name, DOB, and current address.  Location provider: [facility], Office Persons participating in the virtual visit: Denman George LPN, patient, spouse Jadaveon Bechler) and Dr. Billey Chang   Subjective:   [name] is a 74 y.o. male who presents for Medicare Annual/Subsequent preventive examination.  Review of Systems:   Cardiac Risk Factors include: advanced age (>80men, >37 women);male gender;hypertension    Objective:    Vitals: There were no vitals taken for this visit.  There is no height or weight on file to calculate BMI.  Advanced Directives 01/11/2020 01/15/2018 01/14/2017 01/09/2017 12/06/2016 12/06/2016 12/06/2016  Does Patient Have a Medical Advance Directive? Yes Yes Yes Yes Yes Yes Yes  Type of Advance Directive Living will;Healthcare Power of [place];Living will [place];Living will [place];Living will [place];Living will [place];Living will  Does patient want to make changes to medical advance directive? No - Patient declined - - - - - -  Copy of [place] in Chart? No - copy requested - - - - - -    Tobacco Social History   Tobacco Use  Smoking Status Never Smoker  Smokeless Tobacco Never Used     Counseling given: Not Answered   Clinical Intake:  Pre-visit preparation completed: Yes  Diabetes: No  How often do you need to have someone help you when you read instructions, pamphlets, or other written materials from your doctor or pharmacy?: 3 - Sometimes  Interpreter Needed?: No  Comments: wife also present for visit Rhina Brackett) Information entered by :: Denman George LPN  Past Medical History:  Diagnosis Date  . Allergic rhinitis   . Allergy   . Arthritis   . At risk for falls   . Cancer ([place])    basal cell skin cancer  . Cataract   . Complication of anesthesia    Slow to awaken  . Constipation   . DDD (degenerative disc disease), cervical   . Dysphagia   . Edema extremities 01/13/2016  . GERD (gastroesophageal reflux disease)    Decdreased, has decreased coffee intack.  . Glaucoma   . HTN (hypertension)   . Hx of adenomatous colonic polyps 01/23/2016  . Hyperlipidemia   . Hypogonadism male   . Hypothyroidism   . OSA (obstructive sleep apnea)    Has had surgery x 2  . Parkinsonism ([place]) 01/16/2013   no tremors, balance issues only , speech and swallowing difficulties   . Pre-diabetes   . Sleep apnea    surgery x 2- no cpap  . Staph infection 10-2014  . Thyroid disease    HYPOTHYROID   Past Surgical History:  Procedure Laterality Date  . CATARACT EXTRACTION    . COLONOSCOPY  04-27-2004   tics, hems   . I & D EXTREMITY Right 10/18/2014   Procedure: IRRIGATION AND DEBRIDEMENT RIGHT KNEE PREPATELLA BURSA INFECTION;  Surgeon: [name], MD;  Location: [facility];  Service: Orthopedics;  Laterality: Right;  . [place] SURGERY  04-2015  . LUMBAR LAMINECTOMY WITH COFLEX 1 LEVEL N/A 06/06/2015   Procedure: Lumbar three-four Laminectomy/Foraminotomy with placement of Coflex;  Surgeon:  [name], MD;  Location: [facility];  Service: Neurosurgery;  Laterality: N/A;  . NASAL SEPTUM SURGERY    . TONSILLECTOMY     and adenoids  . UVULOPALATOPHARYNGOPLASTY     Family History  Problem Relation Age of Onset  . Emphysema Mother   . COPD Mother   . Heart disease Mother   . Hypertension Mother   . Liver cancer Father   . Cancer Father   . Diabetes Father   . Hypertension Father   . Colon polyps Father   . Heart disease Sister   . COPD Sister   . Heart attack Sister   . Hypertension Sister    . Colon polyps Brother   . Stroke Neg Hx   . Colon cancer Neg Hx    Social History   Socioeconomic History  . Marital status: Married    Spouse name: linda  . Number of children: 2  . Years of education: college  . Highest education level: Not on file  Occupational History  . Occupation: branch Scientist, product/process development: RETIRED  Tobacco Use  . Smoking status: Never Smoker  . Smokeless tobacco: Never Used  Substance and Sexual Activity  . Alcohol use: No    Alcohol/week: 0.0 standard drinks  . Drug use: No  . Sexual activity: Not on file  Other Topics Concern  . Not on file  Social History Narrative   3 Grandchildren    Social Determinants of Health   Financial Resource Strain:   . Difficulty of Paying Living Expenses:   Food Insecurity:   . Worried About Charity fundraiser in the Last Year:   . Arboriculturist in the Last Year:   Transportation Needs:   . Film/video editor (Medical):   Marland Kitchen Lack of Transportation (Non-Medical):   Physical Activity:   . Days of Exercise per Week:   . Minutes of Exercise per Session:   Stress:   . Feeling of Stress :   Social Connections:   . Frequency of Communication with Friends and Family:   . Frequency of Social Gatherings with Friends and Family:   . Attends Religious Services:   . Active Member of Clubs or Organizations:   . Attends Archivist Meetings:   Marland Kitchen Marital Status:     Outpatient Encounter Medications as of 01/11/2020  Medication Sig  . aspirin 81 MG tablet Take 81 mg by mouth daily.  . Cholecalciferol (VITAMIN D) 2000 units CAPS Take 1 capsule by mouth once a week.   . fexofenadine (ALLEGRA) 60 MG tablet Take 60 mg by mouth daily.   . finasteride (PROSCAR) 5 MG tablet Take 5 mg by mouth daily.  . fluticasone (FLONASE) 50 MCG/ACT nasal spray SPRAY 1 SPRAY INTO EACH NOSTRIL EVERY DAY  . levothyroxine (SYNTHROID) 50 MCG tablet TAKE 1 TABLET BY MOUTH EVERY DAY  . losartan (COZAAR) 100 MG tablet  TAKE 1 TABLET BY MOUTH EVERY DAY  . Multiple Vitamins-Minerals (MULTIVITAMIN WITH MINERALS) tablet Take 1 tablet by mouth daily.    . Naproxen Sodium (ALEVE) 220 MG CAPS Take by mouth.  . polyethylene glycol (MIRALAX / GLYCOLAX) 17 g packet Take 17 g by mouth daily.  . timolol (BETIMOL) 0.5 % ophthalmic solution Place 1 drop into both eyes 2 (two) times daily.    No facility-administered encounter medications on file as of 01/11/2020.    Activities of Daily Living In your present state of health, do you  have any difficulty performing the following activities: 01/11/2020 09/25/2019  Hearing? N N  Vision? N N  Difficulty concentrating or making decisions? Tempie Donning  Walking or climbing stairs? Y Y  Comment - ambulates with a walker  Dressing or bathing? Y Y  Doing errands, shopping? Tempie Donning  Preparing Food and eating ? Y -  Comment dysphagia -  Using the Toilet? N -  In the past six months, have you accidently leaked urine? N -  Do you have problems with loss of bowel control? N -  Managing your Medications? Y -  Managing your Finances? Y -  Housekeeping or managing your Housekeeping? Y -  Some recent data might be hidden    Patient Care Team: [name], MD as PCP - General (Family Medicine) Sueanne Margarita, MD as PCP - Cardiology (Cardiology) Bjorn Loser, MD as Consulting Physician (Urology) Shon Hough, MD as Consulting Physician (Ophthalmology) Star Age, MD as Attending Physician (Neurology)   Assessment:   This is a routine wellness examination for Anir.  Exercise Activities and Dietary recommendations Current Exercise Habits: The patient does not participate in regular exercise at present  Goals    . Patient Stated     Find a magic pill and keep pushing until they do!       Fall Risk Fall Risk  01/11/2020 09/25/2019 04/22/2019 11/24/2018 01/15/2018  Falls in the past year? 1 1 1 1  Yes  Number falls in past yr: 1 1 1 1  -  Injury with Fall? 1 1 0 1 -  Risk  Factor Category  - - - - -  Risk for fall due to : History of fall(s);Impaired balance/gait;Impaired mobility;Mental status change Impaired balance/gait;History of fall(s) - Impaired balance/gait Impaired balance/gait;Impaired mobility  Risk for fall due to: Comment - - - - 10 times a day but since the walker but now he is better   Follow up Falls evaluation completed;Education provided;Falls prevention discussed Education provided - Falls evaluation completed -   Is the patient's home free of loose throw rugs in walkways, pet beds, electrical cords, etc?   yes      Grab bars in the bathroom? yes      Handrails on the stairs?   yes      Adequate lighting?   yes   Depression Screen PHQ 2/9 Scores 01/11/2020 04/22/2019 01/15/2018 10/18/2016  PHQ - 2 Score 0 0 0 0  PHQ- 9 Score - 0 - -    Cognitive Function- followed by Neurology  MMSE - Mini Mental State Exam 01/15/2018 11/19/2016 04/30/2016 10/31/2015 11/18/2014  Not completed: (No Data) - - - -  Orientation to time - 5 5 5 5   Orientation to Place - 5 5 5 5   Registration - 3 3 3 3   Attention/ Calculation - 5 3 5 5   Recall - 3 3 3 3   Language- name 2 objects - 2 2 2 2   Language- repeat - 1 1 1 1   Language- follow 3 step command - 3 3 3 3   Language- read & follow direction - 1 1 1 1   Write a sentence - 1 1 1 1   Copy design - 1 1 1 1   Total score - 30 28 30 30         Immunization History  Administered Date(s) Administered  . Fluad Quad(high Dose 65+) 07/08/2019  . Influenza Whole 08/29/2010, 07/09/2011, 07/21/2012  . Influenza, High Dose Seasonal PF 07/24/2013, 07/15/2015, 06/23/2016, 07/04/2017  .  Influenza-Unspecified 07/23/2014, 07/04/2017, 07/08/2018  . Moderna SARS-COVID-2 Vaccination 11/23/2019, 12/22/2019  . Pneumococcal Conjugate-13 05/13/2014  . Pneumococcal Polysaccharide-23 10/08/2002, 08/12/2015  . Tdap 07/08/2010, 07/14/2019  . Zoster 07/09/2007    Qualifies for Shingles Vaccine? Discussed and patient will check with  pharmacy for coverage.  Patient education handout provided   Screening Tests Health Maintenance  Topic Date Due  . INFLUENZA VACCINE  05/08/2020  . OPHTHALMOLOGY EXAM  09/23/2020  . TETANUS/TDAP  07/13/2029  . Hepatitis C Screening  Completed  . PNA vac Low Risk Adult  Completed  . HEMOGLOBIN A1C  Discontinued   Cancer Screenings: Lung: Low Dose CT Chest recommended if Age 106-80 years, 30 pack-year currently smoking OR have quit w/in 15years. Patient does not qualify. Colorectal: colonoscopy 01/16/16      Plan:  I have personally reviewed and addressed the Medicare Annual Wellness questionnaire and have noted the following in the patient's chart:  A. Medical and social history B. Use of alcohol, tobacco or illicit drugs  C. Current medications and supplements D. Functional ability and status E.  Nutritional status F.  Physical activity G. Advance directives H. List of other physicians I.  Hospitalizations, surgeries, and ER visits in previous 12 months J.  [place] such as hearing and vision if needed, cognitive and depression L. Referrals, records requested, and appointments- none   In addition, I have reviewed and discussed with patient certain preventive protocols, quality metrics, and best practice recommendations. A written personalized care plan for preventive services as well as general preventive health recommendations were provided to patient.   Signed,  Denman George, LPN  Nurse Health Advisor   Nurse Notes: Community resources provided to patient and spouse.  Patient has completed Covid vaccine Levan Hurst)

## 2020-01-12 DIAGNOSIS — N319 Neuromuscular dysfunction of bladder, unspecified: Secondary | ICD-10-CM | POA: Diagnosis not present

## 2020-01-12 DIAGNOSIS — R3912 Poor urinary stream: Secondary | ICD-10-CM | POA: Diagnosis not present

## 2020-01-12 DIAGNOSIS — N130 Hydronephrosis with ureteropelvic junction obstruction: Secondary | ICD-10-CM | POA: Diagnosis not present

## 2020-01-18 ENCOUNTER — Telehealth: Payer: Self-pay | Admitting: Neurology

## 2020-01-18 DIAGNOSIS — G231 Progressive supranuclear ophthalmoplegia [Steele-Richardson-Olszewski]: Secondary | ICD-10-CM

## 2020-01-18 DIAGNOSIS — Z9181 History of falling: Secondary | ICD-10-CM

## 2020-01-18 NOTE — Telephone Encounter (Signed)
Pt wife called to advise a new referral for occupational and speech therapy needs to be sent to neuro rehab they advised and order has not been received

## 2020-01-18 NOTE — Telephone Encounter (Signed)
Patient's wife is calling for a new referral.      Please eval with PT, OT and ST and provide recommendations for therapy, if needed. He has atypical parkinsonism.

## 2020-01-18 NOTE — Addendum Note (Signed)
Addended by: Lester Grant A on: 01/18/2020 01:10 PM   Modules accepted: Orders

## 2020-01-19 ENCOUNTER — Encounter (HOSPITAL_COMMUNITY): Payer: Self-pay | Admitting: Emergency Medicine

## 2020-01-19 ENCOUNTER — Emergency Department (HOSPITAL_COMMUNITY)
Admission: EM | Admit: 2020-01-19 | Discharge: 2020-01-20 | Disposition: A | Discharge status: Home / Self Care | Payer: Medicare Other | Attending: Emergency Medicine | Admitting: Emergency Medicine

## 2020-01-19 ENCOUNTER — Emergency Department (HOSPITAL_COMMUNITY): Payer: Medicare Other

## 2020-01-19 DIAGNOSIS — Z7982 Long term (current) use of aspirin: Secondary | ICD-10-CM | POA: Diagnosis not present

## 2020-01-19 DIAGNOSIS — E039 Hypothyroidism, unspecified: Secondary | ICD-10-CM | POA: Insufficient documentation

## 2020-01-19 DIAGNOSIS — W19XXXA Unspecified fall, initial encounter: Secondary | ICD-10-CM | POA: Insufficient documentation

## 2020-01-19 DIAGNOSIS — Z85828 Personal history of other malignant neoplasm of skin: Secondary | ICD-10-CM | POA: Insufficient documentation

## 2020-01-19 DIAGNOSIS — S0990XA Unspecified injury of head, initial encounter: Secondary | ICD-10-CM | POA: Insufficient documentation

## 2020-01-19 DIAGNOSIS — Y998 Other external cause status: Secondary | ICD-10-CM | POA: Insufficient documentation

## 2020-01-19 DIAGNOSIS — R58 Hemorrhage, not elsewhere classified: Secondary | ICD-10-CM | POA: Diagnosis not present

## 2020-01-19 DIAGNOSIS — I10 Essential (primary) hypertension: Secondary | ICD-10-CM | POA: Diagnosis not present

## 2020-01-19 DIAGNOSIS — Y92018 Other place in single-family (private) house as the place of occurrence of the external cause: Secondary | ICD-10-CM | POA: Diagnosis not present

## 2020-01-19 DIAGNOSIS — R55 Syncope and collapse: Secondary | ICD-10-CM | POA: Diagnosis not present

## 2020-01-19 DIAGNOSIS — K117 Disturbances of salivary secretion: Secondary | ICD-10-CM | POA: Diagnosis not present

## 2020-01-19 DIAGNOSIS — Z79899 Other long term (current) drug therapy: Secondary | ICD-10-CM | POA: Diagnosis not present

## 2020-01-19 DIAGNOSIS — R001 Bradycardia, unspecified: Secondary | ICD-10-CM | POA: Diagnosis not present

## 2020-01-19 DIAGNOSIS — R519 Headache, unspecified: Secondary | ICD-10-CM | POA: Diagnosis not present

## 2020-01-19 DIAGNOSIS — G231 Progressive supranuclear ophthalmoplegia [Steele-Richardson-Olszewski]: Secondary | ICD-10-CM | POA: Diagnosis not present

## 2020-01-19 DIAGNOSIS — Y9389 Activity, other specified: Secondary | ICD-10-CM | POA: Insufficient documentation

## 2020-01-19 DIAGNOSIS — G20 Parkinson's disease: Secondary | ICD-10-CM | POA: Diagnosis not present

## 2020-01-19 LAB — CBC
HCT: 48.4 % (ref 39.0–52.0)
Hemoglobin: 16.3 g/dL (ref 13.0–17.0)
MCH: 30.5 pg (ref 26.0–34.0)
MCHC: 33.7 g/dL (ref 30.0–36.0)
MCV: 90.6 fL (ref 80.0–100.0)
Platelets: 206 10*3/uL (ref 150–400)
RBC: 5.34 MIL/uL (ref 4.22–5.81)
RDW: 12.9 % (ref 11.5–15.5)
WBC: 7.7 10*3/uL (ref 4.0–10.5)
nRBC: 0 % (ref 0.0–0.2)

## 2020-01-19 LAB — BASIC METABOLIC PANEL
Anion gap: 8 (ref 5–15)
BUN: 6 mg/dL — ABNORMAL LOW (ref 8–23)
CO2: 28 mmol/L (ref 22–32)
Calcium: 9.4 mg/dL (ref 8.9–10.3)
Chloride: 105 mmol/L (ref 98–111)
Creatinine, Ser: 0.73 mg/dL (ref 0.61–1.24)
GFR calc Af Amer: >60 mL/min (ref >60)
GFR calc non Af Amer: >60 mL/min (ref >60)
Glucose, Bld: 98 mg/dL (ref 70–99)
Potassium: 4.9 mmol/L (ref 3.5–5.1)
Sodium: 141 mmol/L (ref 135–145)

## 2020-01-19 MED ORDER — SODIUM CHLORIDE 0.9% FLUSH: 3.0000 mL | Freq: Once | INTRAVENOUS | Status: DC

## 2020-01-19 MED ORDER — ACETAMINOPHEN 500 MG PO TABS
1000.0000 mg | ORAL_TABLET | Freq: Once | ORAL | Status: AC
  Administered 2020-01-20: 1000 mg via ORAL
  Filled 2020-01-19: qty 2

## 2020-01-19 NOTE — ED Triage Notes (Signed)
Pt arrives via [facility] from home with c/o of 2 falls today last fall pt hit his right eye on floor- hx of parkinson has some memory issues at baseline but alert to person place and time just confused of time. Wife denies blood thinners.

## 2020-01-19 NOTE — ED Provider Notes (Signed)
TIME SEEN: 11:27 PM  CHIEF COMPLAINT: Fall  HPI: [name] is a 75 year old male with history of progressive supranuclear palsy followed by neurology at [facility] who presents to the emergency department today with 2 falls.  Wife reports that falls are typical for him due to his PSP.  She states the second fall tonight [name] hit his head and has an abrasion to the right lateral eyelid.  Last tetanus vaccination was in October 2020.  Today at the neurologist office, [name] did receive Botox injections for sialorrhea.  She denies any recent fevers, cough, vomiting or diarrhea.  [name] denies any other pain other than having a headache currently.  [name] is not on blood thinners.  ROS: See HPI Constitutional: no fever  Eyes: no drainage  ENT: no runny nose   Cardiovascular:  no chest pain  Resp: no SOB  GI: no vomiting GU: no dysuria Integumentary: no rash  Allergy: no hives  Musculoskeletal: no leg swelling  Neurological: no slurred speech ROS otherwise negative  PAST MEDICAL HISTORY/PAST SURGICAL HISTORY:  Past Medical History:  Diagnosis Date  . Allergic rhinitis   . Allergy   . Arthritis   . At risk for falls   . Cancer ([place])    basal cell skin cancer  . Cataract   . Complication of anesthesia    Slow to awaken  . Constipation   . DDD (degenerative disc disease), cervical   . Dysphagia   . Edema extremities 01/13/2016  . GERD (gastroesophageal reflux disease)    Decdreased, has decreased coffee intack.  . Glaucoma   . HTN (hypertension)   . Hx of adenomatous colonic polyps 01/23/2016  . Hyperlipidemia   . Hypogonadism male   . Hypothyroidism   . OSA (obstructive sleep apnea)    Has had surgery x 2  . Parkinsonism ([place]) 01/16/2013   no tremors, balance issues only , speech and swallowing difficulties   . Pre-diabetes   . Sleep apnea    surgery x 2- no cpap  . Staph infection 10-2014  . Thyroid disease    HYPOTHYROID    MEDICATIONS:  Prior to Admission medications   Medication Sig  Start Date End Date Taking? Authorizing Provider  aspirin 81 MG tablet Take 81 mg by mouth daily.    [provider]  Cholecalciferol (VITAMIN D) 2000 units CAPS Take 1 capsule by mouth once a week.     [provider]  fexofenadine (ALLEGRA) 60 MG tablet Take 60 mg by mouth daily.     [provider]  finasteride (PROSCAR) 5 MG tablet Take 5 mg by mouth daily. 03/16/16   [provider]  fluticasone (FLONASE) 50 MCG/ACT nasal spray SPRAY 1 SPRAY INTO EACH NOSTRIL EVERY DAY 01/11/20   [name], MD  levothyroxine (SYNTHROID) 50 MCG tablet TAKE 1 TABLET BY MOUTH EVERY DAY 01/11/20   [name], MD  losartan (COZAAR) 100 MG tablet TAKE 1 TABLET BY MOUTH EVERY DAY 01/11/20   [name], MD  Multiple Vitamins-Minerals (MULTIVITAMIN WITH MINERALS) tablet Take 1 tablet by mouth daily.      [provider]  Naproxen Sodium (ALEVE) 220 MG CAPS Take by mouth.    [provider]  polyethylene glycol (MIRALAX / GLYCOLAX) 17 g packet Take 17 g by mouth daily.    [provider]  timolol (BETIMOL) 0.5 % ophthalmic solution Place 1 drop into both eyes 2 (two) times daily.     [provider]  ALLERGIES:  Allergies  Allergen Reactions  . Ambien [Zolpidem Tartrate]     Unknown- balance  . Atenolol     unknown  . Lunesta [Eszopiclone]     unknown  . Oxazepam     unknown  . Requip [Ropinirole Hcl]     unknown  . Ziac [Bisoprolol-Hydrochlorothiazide]     unknown    SOCIAL HISTORY:  Social History   Tobacco Use  . Smoking status: Never Smoker  . Smokeless tobacco: Never Used  Substance Use Topics  . Alcohol use: No    Alcohol/week: 0.0 standard drinks    FAMILY HISTORY: Family History  Problem Relation Age of Onset  . Emphysema Mother   . COPD Mother   . Heart disease Mother   . Hypertension Mother   . Liver cancer Father   . Cancer Father   . Diabetes Father   . Hypertension Father   . Colon polyps  Father   . Heart disease Sister   . COPD Sister   . Heart attack Sister   . Hypertension Sister   . Colon polyps Brother   . Stroke Neg Hx   . Colon cancer Neg Hx     EXAM: BP 129/73   Pulse 79   Temp 97.8 F (36.6 C) (Oral)   Resp 17   SpO2 98%  CONSTITUTIONAL: Alert and oriented x 3 and responds appropriately to questions. Well-appearing; well-nourished; GCS 15 HEAD: Normocephalic; abrasion to the right lateral eyelid without laceration or soft tissue swelling, no foreign body appreciated EYES: Conjunctivae clear, PERRL, EOMI ENT: normal nose; no rhinorrhea; moist mucous membranes; pharynx without lesions noted; no dental injury; no septal hematoma, no bony tenderness over the face NECK: Supple, no meningismus, no LAD; no midline spinal tenderness, step-off or deformity; trachea midline CARD: RRR; S1 and S2 appreciated; no murmurs, no clicks, no rubs, no gallops RESP: Normal chest excursion without splinting or tachypnea; breath sounds clear and equal bilaterally; no wheezes, no rhonchi, no rales; no hypoxia or respiratory distress CHEST:  chest wall stable, no crepitus or ecchymosis or deformity, nontender to palpation; no flail chest ABD/GI: Normal bowel sounds; non-distended; soft, non-tender, no rebound, no guarding; no ecchymosis or other lesions noted PELVIS:  stable, nontender to palpation BACK:  The back appears normal and is non-tender to palpation, there is no CVA tenderness; no midline spinal tenderness, step-off or deformity EXT: Normal ROM in all joints; non-tender to palpation; no edema; normal capillary refill; no cyanosis, no bony tenderness or bony deformity of [name]'s extremities, no joint effusion, compartments are soft, extremities are warm and well-perfused, no ecchymosis SKIN: Normal color for age and race; warm NEURO: Moves all extremities equally, cranial nerves II through XII intact, normal speech, reports normal sensation diffusely PSYCH: Affect is  flat.  MEDICAL DECISION MAKING: [name] here after fall likely secondary to his history of PSP.  Labs obtained in triage have been reviewed/interpreted by myself and showed no acute abnormality.  Normal hemoglobin, normal electrolytes, normal renal function.  Will obtain CT of the head and cervical spine as well as a urinalysis.  Tetanus vaccination is up-to-date.  No other sign of trauma on exam today.  ED PROGRESS: [name]'s urinalysis has been reviewed/interpreted by myself and shows no sign of infection or dehydration.  CT of the head and cervical spine have been reviewed/interpreted by myself and radiologist and showed no acute abnormality.  [name] has been able to eat and drink here.  Will discharge home with his wife.  They are comfortable with this Schaefer.  [name] has outpatient follow-up.  At this time, I do not feel there is any life-threatening condition present. I have reviewed, interpreted and discussed all results (EKG, imaging, lab, urine as appropriate) and exam findings with [name]. I have reviewed nursing notes and appropriate previous records.  I feel the [name] is safe to be discharged home without further emergent workup and can continue workup as an outpatient as needed. Discussed usual and customary return precautions. [name].  Outpatient follow-up has been provided as needed. All questions have been answered.     EKG Interpretation  Date/Time:  Tuesday January 19 2020 23:21:48 EDT Ventricular Rate:  53 PR Interval:    QRS Duration: 101 QT Interval:  425 QTC Calculation: 399 R Axis:   67 Text Interpretation: Sinus rhythm Artifact No old tracing to compare Confirmed by Breelle Hollywood, Cyril Mourning [phone]) on 01/19/2020 11:26:55 PM        [name] was evaluated in Emergency Department on 01/19/2020 for the symptoms described in the history of present illness. [name] was evaluated in the context of the global COVID-19  pandemic, which necessitated consideration that the [name] might be at risk for infection with the SARS-CoV-2 virus that causes COVID-19. Institutional protocols and algorithms that pertain to the evaluation of patients at risk for COVID-19 are in a state of rapid change based on information released by regulatory bodies including the CDC and federal and state organizations. These policies and algorithms were followed during the [name]'s care in the ED.       Qais Jowers, Delice Bison, DO 01/20/20 [phone]

## 2020-01-19 NOTE — Telephone Encounter (Signed)
Referral has been sent.

## 2020-01-20 LAB — URINALYSIS, ROUTINE W REFLEX MICROSCOPIC
Bilirubin Urine: NEGATIVE
Glucose, UA: NEGATIVE mg/dL
Hgb urine dipstick: NEGATIVE
Ketones, ur: NEGATIVE mg/dL
Leukocytes,Ua: NEGATIVE
Nitrite: NEGATIVE
Protein, ur: NEGATIVE mg/dL
Specific Gravity, Urine: 1.004 — ABNORMAL LOW (ref 1.005–1.030)
pH: 7 (ref 5.0–8.0)

## 2020-01-25 DIAGNOSIS — N319 Neuromuscular dysfunction of bladder, unspecified: Secondary | ICD-10-CM | POA: Diagnosis not present

## 2020-01-25 DIAGNOSIS — N139 Obstructive and reflux uropathy, unspecified: Secondary | ICD-10-CM | POA: Diagnosis not present

## 2020-02-15 ENCOUNTER — Ambulatory Visit: Payer: Medicare Other

## 2020-02-15 ENCOUNTER — Ambulatory Visit: Payer: Medicare Other | Attending: Family Medicine | Admitting: Physical Therapy

## 2020-02-15 ENCOUNTER — Other Ambulatory Visit: Payer: Self-pay

## 2020-02-15 DIAGNOSIS — R41840 Attention and concentration deficit: Secondary | ICD-10-CM | POA: Insufficient documentation

## 2020-02-15 DIAGNOSIS — R41841 Cognitive communication deficit: Secondary | ICD-10-CM | POA: Insufficient documentation

## 2020-02-15 DIAGNOSIS — R293 Abnormal posture: Secondary | ICD-10-CM | POA: Insufficient documentation

## 2020-02-15 DIAGNOSIS — R471 Dysarthria and anarthria: Secondary | ICD-10-CM | POA: Diagnosis not present

## 2020-02-15 DIAGNOSIS — R1310 Dysphagia, unspecified: Secondary | ICD-10-CM | POA: Diagnosis not present

## 2020-02-15 DIAGNOSIS — R41844 Frontal lobe and executive function deficit: Secondary | ICD-10-CM | POA: Insufficient documentation

## 2020-02-15 DIAGNOSIS — R41842 Visuospatial deficit: Secondary | ICD-10-CM | POA: Diagnosis not present

## 2020-02-15 DIAGNOSIS — R2689 Other abnormalities of gait and mobility: Secondary | ICD-10-CM | POA: Diagnosis not present

## 2020-02-15 DIAGNOSIS — R29818 Other symptoms and signs involving the nervous system: Secondary | ICD-10-CM | POA: Diagnosis not present

## 2020-02-15 DIAGNOSIS — R2681 Unsteadiness on feet: Secondary | ICD-10-CM | POA: Insufficient documentation

## 2020-02-15 NOTE — Therapy (Signed)
[facility] [address] [place], [place], [address] Phone: [phone]   Fax:  [phone]  Speech Language Pathology Evaluation  Patient Details  Name: [name] MRN: [id number] Date of Birth: [birth year] Referring Provider (SLP): Star Age, MD   Encounter Date: 02/15/2020  End of Session - 02/15/20 1718    Visit Number  1    Number of Visits  9    Date for SLP Re-Evaluation  05/13/20   90 days   SLP Start Time  85    SLP Stop Time   1400    SLP Time Calculation (min)  42 min    Activity Tolerance  Patient tolerated treatment well       Past Medical History:  Diagnosis Date  . Allergic rhinitis   . Allergy   . Arthritis   . At risk for falls   . Cancer ([place])    basal cell skin cancer  . Cataract   . Complication of anesthesia    Slow to awaken  . Constipation   . DDD (degenerative disc disease), cervical   . Dysphagia   . Edema extremities 01/13/2016  . GERD (gastroesophageal reflux disease)    Decdreased, has decreased coffee intack.  . Glaucoma   . HTN (hypertension)   . Hx of adenomatous colonic polyps 01/23/2016  . Hyperlipidemia   . Hypogonadism male   . Hypothyroidism   . OSA (obstructive sleep apnea)    Has had surgery x 2  . Parkinsonism ([place]) 01/16/2013   no tremors, balance issues only , speech and swallowing difficulties   . Pre-diabetes   . Sleep apnea    surgery x 2- no cpap  . Staph infection 10-2014  . Thyroid disease    HYPOTHYROID    Past Surgical History:  Procedure Laterality Date  . CATARACT EXTRACTION    . COLONOSCOPY  04-27-2004   tics, hems   . I & D EXTREMITY Right 10/18/2014   Procedure: IRRIGATION AND DEBRIDEMENT RIGHT KNEE PREPATELLA BURSA INFECTION;  Surgeon: [name], MD;  Location: [facility];  Service: Orthopedics;  Laterality: Right;  . [place] SURGERY  04-2015  . LUMBAR LAMINECTOMY WITH COFLEX 1 LEVEL N/A 06/06/2015   Procedure: Lumbar three-four  Laminectomy/Foraminotomy with placement of Coflex;  Surgeon: [name], MD;  Location: [facility];  Service: Neurosurgery;  Laterality: N/A;  . NASAL SEPTUM SURGERY    . TONSILLECTOMY     and adenoids  . UVULOPALATOPHARYNGOPLASTY      There were no vitals filed for this visit.  Subjective Assessment - 02/15/20 1729    Subjective  "x x x (unintelligible)"    Patient is accompained by:  Family member   wife linda   Currently in Pain?  No/denies         SLP Evaluation [facility] - 02/15/20 1324      SLP Visit Information   SLP Received On  02/15/20    Referring Provider (SLP)  Star Age, MD    Onset Date  2011    Medical Diagnosis  Parkinson's Plus      Subjective   Patient/Family Stated Goal  Improve swallowing/eating, and talking      General Information   HPI  Pt well known to this SLP from previous courses of ST. Last course 3 years ago - 2018, focusing on dysphagia. ST course in 2017 focused primarily on dysphagia, and pt had great difficulty in performing the HEP accurately. Initial treatment  course in 2017 focused primarily on speech intelligibliity, without notable functional success due to limited/no carryover to home. Pt returns today for re-eval.       Balance Screen   Has the patient fallen in the past 6 months  Yes      Prior Functional Status   Cognitive/Linguistic Baseline  Baseline deficits    Baseline deficit details  awareness, memory, attention, organization/sequencing, executive function    Type of Home  House     Lives With  Spouse    Available Support  Family    Vocation  Retired      Associate Professor   Overall Cognitive Status  History of cognitive impairments - at baseline    Area of Impairment  Attention;Memory;Safety/judgement;Awareness;Problem solving    Current Attention Level  Focused;Sustained    Attention Comments  pt occasionally looking out window at commotion outside    Safety/Judgement  Decreased awareness of safety;Decreased awareness of  deficits    Problem Solving  Slow processing      Oral Motor/Sensory Function   Overall Oral Motor/Sensory Function  Impaired    Labial ROM  Reduced right;Reduced left    Labial Strength  Reduced    Labial Coordination  Reduced    Lingual ROM  Reduced right;Reduced left    Lingual Strength  Reduced    Lingual Coordination  Reduced    Overall Oral Motor/Sensory Function  Wife reports swallow ability has decr'd to puree/very soft foods and thickened liquids. Pt often times "forgets to swallow" and stuffs mouth full, and wife has to cue to swallow. To limit impulsivity with large bites and sips pt eats with hors'd'ourve fork and a smaller spoon. however, if pt "forgetting to swallow" SLP does not see the logic with this idea. Pt drinks with coffee stirrer to limit flow.        Motor Speech   Overall Motor Speech  Impaired    Respiration  Impaired    Level of Impairment  Word    Phonation  Low vocal intensity    Articulation  Impaired    Level of Impairment  Word    Intelligibility  Intelligibility reduced    Word  25-49% accurate   25%; incr'd to 50% in imitation   Sentence  0-24% accurate   0%; incr'd to 17% in imitation   Conversation  0-24% accurate   0%, incr'd 75% for wife (SLP 25%) with 4-6 repeats   Effective Techniques  Slow rate;Pause   "One word at a time"   Volume  Decibel Level;Soft                      SLP Education - 02/15/20 1715    Education provided  Yes    Education Details  eval results, possible goals, disease process at this stage necessitates some formal therapy but largely for education and teaching compensatory strategies for wife to work with pt at home    Person(s) Educated  Patient;Spouse    Methods  Explanation    Comprehension  Verbalized understanding       SLP Short Term Goals - 12/06/16 1733      SLP SHORT TERM GOAL #2   Title  --       SLP Long Term Goals - 02/15/20 1722      SLP LONG TERM GOAL #1   Title  pt will  produce 80% of his chosen communicative phrases/sentences with functional intelligibility in 3 sessions  Time  8    Period  Weeks   or visits, for all LTGs   Status  New      SLP LONG TERM GOAL #2   Title  pt will indicate understanding of 4 s/s aspiration PNA    Time  8    Period  Weeks   or visits   Status  New      SLP LONG TERM GOAL #3   Title  to benefit pt's pulmonary health, wife will demo appropriate cueing for pt during POs over two sessions    Time  8    Period  Weeks   or visits   Status  New      SLP LONG TERM GOAL #4   Title  in 3 sessions, pt communicative partner/wife will demo appropriate compensatory measures to assist in pt-listener communication    Time  8    Period  Weeks   or visits   Status  New       Plan - 02/15/20 1719    Clinical Impression Statement  Pt presents today with significant decline from previous course fo therapy 3 years ago. Pt's swallow with food from home will be assessed "bedside" on next visit. Pt/wife politely refuse modified barium swallow assessment at this time. Pt has not had pulmonary issues in the last 12 months. Skilled speech therapy will focus mainly on intelligibility of pt's functional language, and compensatory strategies for wife to use with pt to maximize his communication, and safety with POs.    Speech Therapy Frequency  1x /week   pt desired once/week as opposed to x2/week   Duration  --   8 weeks/9 total sessions   Treatment/Interventions  Aspiration precaution training;Diet toleration management by SLP;Cueing hierarchy;Patient/family education;SLP instruction and feedback;Internal/external aids;Compensatory techniques;Environmental controls;Pharyngeal strengthening exercises;Language facilitation;Cognitive reorganization;Functional tasks;Trials of upgraded texture/liquids;Multimodal communcation approach;Oral motor exercises    Potential to Achieve Goals  Fair    Potential Considerations  Severity of  impairments;Previous level of function;Medical prognosis;Cooperation/participation level;Ability to learn/carryover information    Consulted and Agree with Plan of Care  Patient;Family member/caregiver    Family Member Consulted  [name]       Patient will benefit from skilled therapeutic intervention in order to improve the following deficits and impairments:   Dysarthria and anarthria  Dysphagia, unspecified type  Cognitive communication deficit    Problem List Patient Active Problem List   Diagnosis Date Noted  . Hypothyroidism 02/18/2018  . Peripheral neuropathy 11/14/2016  . PSP (progressive supranuclear palsy) ([place]) 11/08/2016  . Tubular adenoma of colon 01/23/2016  . Lumbar stenosis 06/06/2015  . HOCM (hypertrophic obstructive cardiomyopathy) ([place]) 12/12/2014  . Testosterone deficiency 11/12/2013  . Mixed hyperlipidemia 08/30/2013  . 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 Parkinsonism ([place]) 01/16/2013  . GLAUCOMA 06/01/2008  . Essential hypertension 06/01/2008  . Seasonal and perennial allergic rhinitis 06/01/2008    [facility] ,[place], CCC-SLP  02/15/2020, 5:31 PM  [facility] [address] [place], [place], [address] Phone: [phone]   Fax:  [phone]  Name: [name] MRN: [id number] Date of Birth: [birth year]

## 2020-02-15 NOTE — Therapy (Signed)
[facility] [address] [place], [place], [address] Phone: [phone]   Fax:  [phone]  Physical Therapy Evaluation  Patient Details  Name: [name] MRN: [id number] Date of Birth: 06/04/1946 Referring Provider (PT): Star Age   Encounter Date: 02/15/2020  PT End of Session - 02/15/20 1530    Visit Number  1    Number of Visits  9    Date for PT Re-Evaluation  123456   90 day cert for 9 week POC   Authorization Type  Medicare/BCBS    Progress Note Due on Visit  10    PT Start Time  1233    PT Stop Time  1316    PT Time Calculation (min)  43 min    Equipment Utilized During Treatment  Gait belt    Activity Tolerance  Patient tolerated treatment well    Behavior During Therapy  [facility] for tasks assessed/performed;Impulsive   impulsive at times with transfers at end of session      Past Medical History:  Diagnosis Date  . Allergic rhinitis   . Allergy   . Arthritis   . At risk for falls   . Cancer ([place])    basal cell skin cancer  . Cataract   . Complication of anesthesia    Slow to awaken  . Constipation   . DDD (degenerative disc disease), cervical   . Dysphagia   . Edema extremities 01/13/2016  . GERD (gastroesophageal reflux disease)    Decdreased, has decreased coffee intack.  . Glaucoma   . HTN (hypertension)   . Hx of adenomatous colonic polyps 01/23/2016  . Hyperlipidemia   . Hypogonadism male   . Hypothyroidism   . OSA (obstructive sleep apnea)    Has had surgery x 2  . Parkinsonism ([place]) 01/16/2013   no tremors, balance issues only , speech and swallowing difficulties   . Pre-diabetes   . Sleep apnea    surgery x 2- no cpap  . Staph infection 10-2014  . Thyroid disease    HYPOTHYROID    Past Surgical History:  Procedure Laterality Date  . CATARACT EXTRACTION    . COLONOSCOPY  04-27-2004   tics, hems   . I & D EXTREMITY Right 10/18/2014   Procedure: IRRIGATION AND DEBRIDEMENT  RIGHT KNEE PREPATELLA BURSA INFECTION;  Surgeon: [name], MD;  Location: [facility];  Service: Orthopedics;  Laterality: Right;  . [place] SURGERY  04-2015  . LUMBAR LAMINECTOMY WITH COFLEX 1 LEVEL N/A 06/06/2015   Procedure: Lumbar three-four Laminectomy/Foraminotomy with placement of Coflex;  Surgeon: [name], MD;  Location: [facility];  Service: Neurosurgery;  Laterality: N/A;  . NASAL SEPTUM SURGERY    . TONSILLECTOMY     and adenoids  . UVULOPALATOPHARYNGOPLASTY      There were no vitals filed for this visit.   Subjective Assessment - 02/15/20 1236    Subjective  Wife reports 2 ED visits due to serious falls in the past 6 months.  Wife has made list of things to work on:  holding on to the walker correctly, doorways, safely sit to stand, getting out of the bed, thresholds and varied surfaces like outdoors, leans in sitting with feet off the floor.    Patient is accompained by:  Family member   wife   Pertinent History  PSP, hx of falls    Patient Stated Goals  See above for wife's list of things to work on-pt agreeable  to these goals    Currently in Pain?  No/denies         [facility] PT Assessment - 02/15/20 1240      Assessment   Medical Diagnosis  PSP, hx of falls    Referring Provider (PT)  Star Age    Onset Date/Surgical Date  01/18/20      Precautions   Precautions  Fall;Other (comment)   Swallowing-thickened liquids   Precaution Comments  When he falls, it is face-first, "no-stopping"      Balance Screen   Has the patient fallen in the past 6 months  Yes    How many times?  25    Has the patient had a decrease in activity level because of a fear of falling?   No    Is the patient reluctant to leave their home because of a fear of falling?   No      Home Film/video editor residence    Living Arrangements  Spouse/significant other    Available Help at Discharge  Family    Type of [place] to enter     Entrance Stairs-Number of Steps  3    Entrance Stairs-Rails  Right    [place]  Two level;Able to live on main level with bedroom/bathroom    [place] - 2 wheels;Transport chair    Additional Comments  Wife's concern is when he is in a big hurry, or doing something when is doing something he shouldn't, especially with taking hands off walker.      Prior Function   Level of Independence  Needs assistance with ADLs;Requires assistive device for independence    Comments  Enjoys going to [employer] iwth family and daughters      Cognition   Overall Cognitive Status  History of cognitive impairments - at baseline   See speech therapy eval     Observation/Other Assessments   Focus on Therapeutic Outcomes (FOTO)   NA      Posture/Postural Control   Posture/Postural Control  Postural limitations    Postural Limitations  Rounded Shoulders;Forward head;Weight shift left      ROM / Strength   AROM / PROM / Strength  Strength;PROM      PROM   Overall PROM   Deficits    Overall PROM Comments  R ankle dorsiflexion 90 degrees; 10 degrees beyond neutral LLE      Strength   Overall Strength  Deficits    Strength Assessment Site  Hip;Knee;Ankle    Right/Left Hip  Right;Left    Right Hip Flexion  4/5    Left Hip Flexion  4/5    Right/Left Knee  Right;Left    Right Knee Flexion  3+/5    Right Knee Extension  3+/5    Left Knee Flexion  3+/5    Left Knee Extension  3+/5    Right/Left Ankle  Right;Left    Right Ankle Dorsiflexion  3-/5    Left Ankle Dorsiflexion  3-/5      Bed Mobility   Bed Mobility  Not assessed   Wife reports he tries to stand from supine-decr. safety     Transfers   Transfers  Sit to Stand;Stand to Sit    Sit to Stand  4: Min guard;With upper extremity assist;From chair/3-in-1    Stand to Sit  4: Min guard;With upper extremity assist;To chair/3-in-1    Comments  Wife reports he typically pushes chair posteriorly at legs (he demo this during TUG  test) and when he is sitting, he will sit posteriorly with uncontrolled descent.      Ambulation/Gait   Ambulation/Gait  Yes    Ambulation/Gait Assistance  4: Min guard    Ambulation Distance (Feet)  150 Feet    Assistive device  Rolling walker    Gait Pattern  Step-through pattern;Decreased step length - right;Decreased step length - left;Decreased dorsiflexion - right;Right steppage   Strong lean on RW, foot slap RLE   Ambulation Surface  Level;Indoor    Gait velocity  15.6 sec = 2.1 ft/sec      Standardized Balance Assessment   Standardized Balance Assessment  Timed Up and Go Test      Timed Up and Go Test   TUG  Normal TUG    Normal TUG (seconds)  36.6    TUG Comments  Scores >13.5 sec indicate increased fall risk; >30 seconds indicates difficulty with ADLs in the home                Objective measurements completed on examination: See above findings.           Therapeutic Activity: Sit<>stand transfer technique education provided through verbal, demo cues, then pt performs with cues for 1)scoot forward, 2)tuck feet under knees, feet flat and 3)Nose over toes to stand at RW, using hands to push up from chair.  Cues provided to perform in reverse, with "hinge" at hips to squat and slowly sit.  Pt performs x 5 reps, then x 3 reps with PT min guard/close supervision and cues.  PT discussed importance of this technique for safety with transfers and for improved lower extremity functional strength, with repetition of correct technique over time.   PT Education - 02/15/20 1529    Education Details  Eval results, POC; safety with sit<>stand transfers    Person(s) Educated  Patient;Spouse    Methods  Explanation;Demonstration;Verbal cues    Comprehension  Verbalized understanding;Returned demonstration;Verbal cues required;Need further instruction       PT Short Term Goals - 02/15/20 1840      PT SHORT TERM GOAL #1   Title  Pt will perform HEP for strength,  flexibility, balance and gait with wife's supervision.  TARGET 03/18/2020    Time  5    Period  Weeks    Status  New      PT SHORT TERM GOAL #2   Title  Pt will perform at least 4 of 5 reps of sit<>stand, demonstrating proper sit<>stand technique with supervision, for improved functional strength and safety/efficiency with transfers.    Time  5    Period  Weeks    Status  New      PT SHORT TERM GOAL #3   Title  Pt will improve TUG score to less than or equal to 30 seconds for decreased fall risk.    Baseline  36.6 sec at eval    Time  5    Period  Weeks    Status  New      PT SHORT TERM GOAL #4   Title  Pt/wife will report at least 25% improvement in bed mobility, for improved safety/decreased caregiver burden with bed moiblity.    Time  5    Period  Weeks    Status  New        PT Long Term Goals - 02/15/20 1843      PT  LONG TERM GOAL #1   Title  Pt/wife will verbalize understanding of fall prevention, including tips to reduce freezing.  TARGET 04/15/2020    Time  9    Period  Weeks    Status  New      PT LONG TERM GOAL #2   Title  Wife will demonstrate/verbalize appropriate cueing and assistance with pt and RW for safety with doorways and threshold negotiation.    Time  9    Period  Weeks    Status  New      PT LONG TERM GOAL #3   Title  Pt will improve gait velocity to at least 2.3 ft/sec for improved efficiency and safety with gait.    Baseline  2.1 ft at eval    Time  9    Period  Weeks    Status  New      PT LONG TERM GOAL #4   Title  Pt/wife will verbalize/demo understanding of safety with walker negotiation on outdoor surfaces and unlevel surfaces, at least 500 ft, for improved participation in community ambulation.    Time  9    Period  Weeks    Status  New             Plan - 02/15/20 1531    Clinical Impression Statement  Pt is a 74 year old male who presents to OPPT with history of PSP and falls.  Pt and wife report at least 25 falls in the past 6  months, with wife reporting falls occur landing face first.  Pt demonstrates decreased muscle strength, decreased balance, decreased gait velocity, decreased safety awareness with transfers and bed mobility and gait.  Wife reports difficulty with bed mobility, doorway negotiation, thresholds, and outdoor surfaces.  Pt enjoys going out of the home with family; he would benefit from skilled PT to address the above stated deficits, with emphasis on family education for cueing for optimal safety with funcitonal mobility and gait techniques.    Personal Factors and Comorbidities  Comorbidity 3+    Comorbidities  GERD, HTN DDD cervical spine, arthritis, OSA    Examination-Activity Limitations  Bed Mobility;Locomotion Level;Transfers;Stand    Examination-Participation Restrictions  Community Activity    Stability/Clinical Decision Making  Evolving/Moderate complexity    Clinical Decision Making  Moderate    Rehab Potential  Good    PT Frequency  1x / week    PT Duration  Other (comment)   8 weeks, plus eval visit; total POC = 9 weeks   PT Treatment/Interventions  ADLs/Self Care Home Management;DME Instruction;Neuromuscular re-education;Balance training;Therapeutic exercise;Therapeutic activities;Functional mobility training;Stair training;Gait training;Patient/family education    PT Next Visit Plan  Review transfer training, work on bed mobility; safety education with gait-doorways, thresholds, varied surfaces; initiate HEP for lower extremity strengthening, balance    Consulted and Agree with Plan of Care  Patient;Family member/caregiver    Family Member Consulted  wife, [name]       Patient will benefit from skilled therapeutic intervention in order to improve the following deficits and impairments:  Abnormal gait, Difficulty walking, Decreased safety awareness, Decreased balance, Decreased mobility, Decreased strength, Postural dysfunction  Visit Diagnosis: Other abnormalities of gait and  mobility  Unsteadiness on feet  Other symptoms and signs involving the nervous system     Problem List Patient Active Problem List   Diagnosis Date Noted  . Hypothyroidism 02/18/2018  . Peripheral neuropathy 11/14/2016  . PSP (progressive supranuclear palsy) ([place]) 11/08/2016  . Tubular adenoma  of colon 01/23/2016  . Lumbar stenosis 06/06/2015  . HOCM (hypertrophic obstructive cardiomyopathy) ([place]) 12/12/2014  . Testosterone deficiency 11/12/2013  . Mixed hyperlipidemia 08/30/2013  . 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 Parkinsonism ([place]) 01/16/2013  . GLAUCOMA 06/01/2008  . Essential hypertension 06/01/2008  . Seasonal and perennial allergic rhinitis 06/01/2008    Basia Mcginty W. 02/15/2020, 6:48 PM  Frazier Butt., PT   [facility] [address] [place], [place], [address] Phone: [phone]   Fax:  [phone]  Name: [name] MRN: [id number] Date of Birth: [birth year]

## 2020-02-15 NOTE — Patient Instructions (Signed)
   Bring in food from home so we can see how you are eating!  Think about 8-10 sentences you need to or normally say each day.

## 2020-02-17 ENCOUNTER — Encounter: Payer: Self-pay | Admitting: Occupational Therapy

## 2020-02-17 ENCOUNTER — Ambulatory Visit: Payer: Medicare Other | Admitting: Occupational Therapy

## 2020-02-17 ENCOUNTER — Other Ambulatory Visit: Payer: Self-pay

## 2020-02-17 DIAGNOSIS — R2681 Unsteadiness on feet: Secondary | ICD-10-CM | POA: Diagnosis not present

## 2020-02-17 DIAGNOSIS — R29818 Other symptoms and signs involving the nervous system: Secondary | ICD-10-CM | POA: Diagnosis not present

## 2020-02-17 DIAGNOSIS — R41842 Visuospatial deficit: Secondary | ICD-10-CM

## 2020-02-17 DIAGNOSIS — R293 Abnormal posture: Secondary | ICD-10-CM

## 2020-02-17 DIAGNOSIS — R1310 Dysphagia, unspecified: Secondary | ICD-10-CM | POA: Diagnosis not present

## 2020-02-17 DIAGNOSIS — R2689 Other abnormalities of gait and mobility: Secondary | ICD-10-CM

## 2020-02-17 DIAGNOSIS — R41844 Frontal lobe and executive function deficit: Secondary | ICD-10-CM

## 2020-02-17 DIAGNOSIS — R41841 Cognitive communication deficit: Secondary | ICD-10-CM | POA: Diagnosis not present

## 2020-02-17 DIAGNOSIS — R471 Dysarthria and anarthria: Secondary | ICD-10-CM | POA: Diagnosis not present

## 2020-02-17 DIAGNOSIS — R41840 Attention and concentration deficit: Secondary | ICD-10-CM

## 2020-02-17 NOTE — Therapy (Addendum)
[facility] [address] [place], [place], [address] Phone: [phone]   Fax:  [phone]  Occupational Therapy Evaluation  Patient Details  Name: [name] MRN: [id number] Date of Birth: Jul 20, 1946 Referring Provider (OT): Dr. Star Age   Encounter Date: 02/17/2020  OT End of Session - 02/17/20 1428    Visit Number  1    Number of Visits  9    Date for OT Re-Evaluation  04/17/20    Authorization Type  Medicare / BCBS, covered 100%    Authorization Time Period  --    Authorization - Visit Number  1    Authorization - Number of Visits  10    Progress Note Due on Visit  10    OT Start Time  1318    OT Stop Time  1410    OT Time Calculation (min)  52 min    Activity Tolerance  Patient tolerated treatment well    Behavior During Therapy  [facility] for tasks assessed/performed;Impulsive       Past Medical History:  Diagnosis Date  . Allergic rhinitis   . Allergy   . Arthritis   . At risk for falls   . Cancer ([place])    basal cell skin cancer  . Cataract   . Complication of anesthesia    Slow to awaken  . Constipation   . DDD (degenerative disc disease), cervical   . Dysphagia   . Edema extremities 01/13/2016  . GERD (gastroesophageal reflux disease)    Decdreased, has decreased coffee intack.  . Glaucoma   . HTN (hypertension)   . Hx of adenomatous colonic polyps 01/23/2016  . Hyperlipidemia   . Hypogonadism male   . Hypothyroidism   . OSA (obstructive sleep apnea)    Has had surgery x 2  . Parkinsonism ([place]) 01/16/2013   no tremors, balance issues only , speech and swallowing difficulties   . Pre-diabetes   . Sleep apnea    surgery x 2- no cpap  . Staph infection 10-2014  . Thyroid disease    HYPOTHYROID    Past Surgical History:  Procedure Laterality Date  . CATARACT EXTRACTION    . COLONOSCOPY  04-27-2004   tics, hems   . I & D EXTREMITY Right 10/18/2014   Procedure: IRRIGATION AND DEBRIDEMENT  RIGHT KNEE PREPATELLA BURSA INFECTION;  Surgeon: [name], MD;  Location: [facility];  Service: Orthopedics;  Laterality: Right;  . [place] SURGERY  04-2015  . LUMBAR LAMINECTOMY WITH COFLEX 1 LEVEL N/A 06/06/2015   Procedure: Lumbar three-four Laminectomy/Foraminotomy with placement of Coflex;  Surgeon: [name], MD;  Location: [facility];  Service: Neurosurgery;  Laterality: N/A;  . NASAL SEPTUM SURGERY    . TONSILLECTOMY     and adenoids  . UVULOPALATOPHARYNGOPLASTY      There were no vitals filed for this visit.  Subjective Assessment - 02/17/20 1314    Subjective   Pt wants to work on signing name    Patient is accompanied by:  Family member   wife Vaughan Schaefer   Pertinent History  Progressive Supranuclear Palsy (PSP).PMH:  HTN, Glaucoma, hyperlipidemia, testosterone deficiency, lumbar stenosis, peripheral neuropathy, hypothyroidism, DDD, hx of multiple falls, dysphagia, GERD    Limitations  fall risk, cognitive deficits, impulsivity; visual deficits, swallowing deficits    Patient Stated Goals  writing/signing name, getting up from chair, grasping walker/holding utensil, washing/drying hands    Currently in Pain?  No/denies  [facility] OT Assessment - 02/17/20 0001      Assessment   Medical Diagnosis  PSP, hx of falls    Referring Provider (OT)  Dr. Star Age    Onset Date/Surgical Date  01/18/20    Hand Dominance  Left    Prior Therapy  last outpatient OT 2018      Precautions   Precautions  Fall;Other (comment)   impulsive, visual deficits   Precaution Comments  When he falls, it is face-first, "no-stopping"      Balance Screen   Has the patient fallen in the past 6 months  Yes    How many times?  multiple approx 25, 2 ED visits      Home  Environment   Family/patient expects to be discharged to:  Private residence    Lives With  Spouse      Prior Function   Level of Independence  Needs assistance with ADLs;Requires assistive device for independence     Comments  Enjoys going to farmers market with family and daughters      ADL   Eating/Feeding  --   needs set-up, cueing due to impulsivity   Eating/Feeding  pt puts too many bites in mouth at once prior to swallowing, incr saliva    eats with small fork/spoon   Eating/Feeding Patient Percentage  --   simulated eating/scooping (held spoon at end), min difficult   Grooming  --   cueing for brushing teeth, wife puts on deodorant   Grooming details  electric razor. wife combs hair    Upper Body Bathing  Supervision/safety    Lower Body Bathing  Supervision/safety    Upper Body Dressing  Minimal assistance   cueing   Lower Body Dressing  Modified independent;Moderate assistance   pt does pants, wife does shoes/socks   Armed forces technical officer  Modified independent   hx of falls   Toileting - Clothing Manipulation  Modified independent    Tub/Shower Transfer  Moderate assistance    Transfers/Ambulation Related to ADL's  Min-mod cueing/A for sit>stand. simulated cleaning hands with sanitizer with significant hypokinesia despite cueing and simulated drying with min cueing for hypokinesia.    ADL comments  Wife presents with list of things to work on including:  grasping walker/utensil with all fingers, washing hands, sit>stand, signing name      Mobility   Mobility Status  History of falls   frequent, severe, "falls on face"   Mobility Status Comments  uses RW, but walks away/lets go with distractions      Written Expression   Dominant Hand  Left    Handwriting  Not legible;Severe micrographia   per examples presented today     Vision Assessment   Ocular Range of Motion  --   limited up/down and left/right   Tracking/Visual Pursuits  Impaired - to be further tested in functional context   difficulty with horizontal and vertical   Visual Fields  --   only has central vision due to difficulty with eye movements   Comment  presents with head down posture with standing/walking, needs cueing to  attempt to look at items that he is manipulating      Cognition   Overall Cognitive Status  Impaired/Different from baseline    Area of Impairment  Attention;Memory;Safety/judgement;Awareness;Problem solving    Current Attention Level  Focused;Sustained    Safety/Judgement  Decreased awareness of safety    Problem Solving  Slow processing    Behaviors  Impulsive  Observation/Other Assessments   Other Surveys   Select    Physical Performance Test    --      Posture/Postural Control   Posture/Postural Control  Postural limitations    Postural Limitations  Rounded Shoulders;Forward head;Weight shift left      Coordination   Other  wife reports difficulty pressing remote buttons due to difficulty releasing buttons    Coordination  impulsive, pt noted to grasp objects with digits 3-5 particularly with R hand.  Wife reports that this is frequent with both hands and that he often does not wrap fingers around RW       ROM / Strength   AROM / PROM / Strength  AROM      AROM   Overall AROM   Within functional limits for tasks performed   [place]                     OT Education - 02/17/20 1426    Education Details  OT POC; Discussed PSP concerns including visual deficits, falls, impulsivity, dysphagia.  Recommended eating at table without tv/distractions, limited portions.    Person(s) Educated  Patient;Spouse    Methods  Explanation    Comprehension  Verbalized understanding       OT Short Term Goals - 02/17/20 1544      OT SHORT TERM GOAL #1   Title  Pt will perform HEP with focus on large amplitude, functional movements.--check STGs 03/19/20    Time  4    Period  Weeks    Status  New      OT SHORT TERM GOAL #2   Title  Pt will wash/dry hands adequately with min cueing.    Time  4    Period  Weeks    Status  New      OT SHORT TERM GOAL #3   Title  Pt will be able to sign name with at least 50% legibility    Time  4    Period  Weeks    Status  New       OT SHORT TERM GOAL #4   Title  ---        OT Long Term Goals - 02/17/20 1546      OT LONG TERM GOAL #1   Title  Pt/caregiver will verbalize understanding of updated adaptive strategies/AE for ADLs prn.--check LTGs 04/18/20    Time  8    Period  Weeks    Status  New      OT LONG TERM GOAL #2   Title  Pt will demo improved grasp of RW with BUEs at least 75% of the time without cueing.    Time  8    Period  Weeks    Status  New      OT LONG TERM GOAL #3   Title  Pt will be able to sign name with at least 75% legibility.    Time  8    Period  Weeks    Status  New      OT LONG TERM GOAL #4   Title  Pt/wife with report improved ability to grasp spoon/fork when eating using AE/strategies prn.    Time  8    Period  Weeks    Status  New      OT LONG TERM GOAL #5   Title  ---            Plan - 02/17/20 1431  Clinical Impression Statement  Pt is a 74 y.o. male with PSP who is familiar to this clinic/therapist and was last seen in 2018.  Pt with PMH that includes:  HTN, Glaucoma, vision, hyperlipidemia, testosterone deficiency, lumbar stenosis, peripheral neuropathy, hypothyroidism, DDD, hx of multiple falls, dysphagia, GERD.  Pt/wife report progression of PSP with incr falls (including 2 ED visits in last 68months due to falls), decline in ADLs/incr assist needed.  Pt presents with impulsivity, cognitive deficits, hypokinesia, decr balance, decr posture, decr coordination, visual deficits for ADLs/IADLs.  Pt would benefit from occupational therapy to address these deficits with emphasis on safe participation, activity modifications, and caregiver education.    OT Occupational Profile and History  Detailed Assessment- Review of Records and additional review of physical, cognitive, psychosocial history related to current functional performance    Occupational performance deficits (Please refer to evaluation for details):  ADL's;IADL's;Leisure;Social Participation    Body Structure /  Function / Physical Skills  ADL;Dexterity;Vision    Rehab Potential  Good    Clinical Decision Making  Several treatment options, min-mod task modification necessary    Comorbidities Affecting Occupational Performance:  May have comorbidities impacting occupational performance    Modification or Assistance to Complete Evaluation   Min-Moderate modification of tasks or assist with assess necessary to complete eval    OT Frequency  1x / week    OT Duration  8 weeks   +eval   OT Treatment/Interventions  Self-care/ADL training;DME and/or AE instruction;Balance training;Therapeutic activities;Aquatic Therapy;Cognitive remediation/compensation;Therapeutic exercise;Neuromuscular education;Functional Mobility Training;Passive range of motion;Visual/perceptual remediation/compensation;Patient/family education;Manual Therapy    Plan  strategies for ADLs; caregiver education    OT Home Exercise Plan  --    Consulted and Agree with Plan of Care  Patient;Family member/caregiver    Family Member Consulted  wife--[name]       Patient will benefit from skilled therapeutic intervention in order to improve the following deficits and impairments:   Body Structure / Function / Physical Skills: ADL, Dexterity, Vision       Visit Diagnosis: Other symptoms and signs involving the nervous system - Plan: Ot plan of care cert/re-cert  Unsteadiness on feet - Plan: Ot plan of care cert/re-cert  Other abnormalities of gait and mobility - Plan: Ot plan of care cert/re-cert  Abnormal posture - Plan: Ot plan of care cert/re-cert  Visuospatial deficit - Plan: Ot plan of care cert/re-cert  Attention and concentration deficit - Plan: Ot plan of care cert/re-cert  Frontal lobe and executive function deficit - Plan: Ot plan of care cert/re-cert    Problem List Patient Active Problem List   Diagnosis Date Noted  . Hypothyroidism 02/18/2018  . Peripheral neuropathy 11/14/2016  . PSP (progressive supranuclear  palsy) ([place]) 11/08/2016  . Tubular adenoma of colon 01/23/2016  . Lumbar stenosis 06/06/2015  . HOCM (hypertrophic obstructive cardiomyopathy) ([place]) 12/12/2014  . Testosterone deficiency 11/12/2013  . Mixed hyperlipidemia 08/30/2013  . 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 Parkinsonism ([place]) 01/16/2013  . GLAUCOMA 06/01/2008  . Essential hypertension 06/01/2008  . Seasonal and perennial allergic rhinitis 06/01/2008    [facility] 02/17/2020, 6:42 PM  [facility] [address] [place], [place], [address] Phone: [phone]   Fax:  [phone]  Name: Tymaine Coello MRN: [id number] Date of Birth: April 03, 1946   Vianne Bulls, OTR/L [facility] [address] [place], [place]  [address] [phone] phone [phone] 02/17/20 6:42 PM

## 2020-02-26 ENCOUNTER — Other Ambulatory Visit: Payer: Self-pay

## 2020-02-26 ENCOUNTER — Ambulatory Visit: Payer: Medicare Other | Admitting: Physical Therapy

## 2020-02-26 DIAGNOSIS — R471 Dysarthria and anarthria: Secondary | ICD-10-CM | POA: Diagnosis not present

## 2020-02-26 DIAGNOSIS — R41841 Cognitive communication deficit: Secondary | ICD-10-CM | POA: Diagnosis not present

## 2020-02-26 DIAGNOSIS — R2681 Unsteadiness on feet: Secondary | ICD-10-CM | POA: Diagnosis not present

## 2020-02-26 DIAGNOSIS — R2689 Other abnormalities of gait and mobility: Secondary | ICD-10-CM

## 2020-02-26 DIAGNOSIS — R293 Abnormal posture: Secondary | ICD-10-CM

## 2020-02-26 DIAGNOSIS — R1310 Dysphagia, unspecified: Secondary | ICD-10-CM | POA: Diagnosis not present

## 2020-02-26 DIAGNOSIS — R29818 Other symptoms and signs involving the nervous system: Secondary | ICD-10-CM | POA: Diagnosis not present

## 2020-02-26 NOTE — Patient Instructions (Signed)
Access Code: [email or web address] URL: [email or web address] Date: 02/26/2020 Prepared by: Janann August  Exercises Seated Heel Toe Raises - 2 x daily - 5 x weekly - 2 sets - 10 reps Seated March - 1-2 x daily - 5 x weekly - 2 sets - 10 reps Seated Long Arc Quad - 1-2 x daily - 5 x weekly - 2 sets - 10 reps Hooklying Single Leg Bent Knee Fallouts with Resistance - 1 x daily - 5 x weekly - 2 sets - 10 reps Supine Bridge - 1 x daily - 5 x weekly - 2 sets - 10 reps

## 2020-02-26 NOTE — Therapy (Signed)
[facility] [address] [place], [place], [address] Phone: [phone]   Fax:  [phone]  Physical Therapy Treatment  Patient Details  Name: [name] MRN: [id number] Date of Birth: May 23, 1946 Referring Provider (PT): Star Age   Encounter Date: 02/26/2020  PT End of Session - 02/26/20 1541    Visit Number  2    Number of Visits  9    Date for PT Re-Evaluation  123456   90 day cert for 9 week POC   Authorization Type  Medicare/BCBS    Progress Note Due on Visit  10    PT Start Time  1230    PT Stop Time  1313    PT Time Calculation (min)  43 min    Equipment Utilized During Treatment  Gait belt    Activity Tolerance  Patient tolerated treatment well    Behavior During Therapy  [facility] for tasks assessed/performed;Impulsive   impulsive at times with transfers      Past Medical History:  Diagnosis Date  . Allergic rhinitis   . Allergy   . Arthritis   . At risk for falls   . Cancer ([place])    basal cell skin cancer  . Cataract   . Complication of anesthesia    Slow to awaken  . Constipation   . DDD (degenerative disc disease), cervical   . Dysphagia   . Edema extremities 01/13/2016  . GERD (gastroesophageal reflux disease)    Decdreased, has decreased coffee intack.  . Glaucoma   . HTN (hypertension)   . Hx of adenomatous colonic polyps 01/23/2016  . Hyperlipidemia   . Hypogonadism male   . Hypothyroidism   . OSA (obstructive sleep apnea)    Has had surgery x 2  . Parkinsonism ([place]) 01/16/2013   no tremors, balance issues only , speech and swallowing difficulties   . Pre-diabetes   . Sleep apnea    surgery x 2- no cpap  . Staph infection 10-2014  . Thyroid disease    HYPOTHYROID    Past Surgical History:  Procedure Laterality Date  . CATARACT EXTRACTION    . COLONOSCOPY  04-27-2004   tics, hems   . I & D EXTREMITY Right 10/18/2014   Procedure: IRRIGATION AND DEBRIDEMENT RIGHT KNEE  PREPATELLA BURSA INFECTION;  Surgeon: [name], MD;  Location: [facility];  Service: Orthopedics;  Laterality: Right;  . [place] SURGERY  04-2015  . LUMBAR LAMINECTOMY WITH COFLEX 1 LEVEL N/A 06/06/2015   Procedure: Lumbar three-four Laminectomy/Foraminotomy with placement of Coflex;  Surgeon: [name], MD;  Location: [facility];  Service: Neurosurgery;  Laterality: N/A;  . NASAL SEPTUM SURGERY    . TONSILLECTOMY     and adenoids  . UVULOPALATOPHARYNGOPLASTY      There were no vitals filed for this visit.  Subjective Assessment - 02/26/20 1233    Subjective  Had a fall this morning - fell backwards, was trying to swat a bug. Last Sunday got too close to the edge of the couch to pick up remote and fell forward. No injuries.    Patient is accompained by:  Family member   wife   Pertinent History  PSP, hx of falls    Patient Stated Goals  See above for wife's list of things to work on-pt agreeable to these goals    Currently in Pain?  No/denies  Access Code: [email or web address] URL: [email or web address] Date: 02/26/2020 Prepared by: Janann August  Initiated HEP and reviewed prior exercises on HEP:   Exercises Seated Heel Toe Raises - 2 x daily - 5 x weekly - 2 sets - 10 reps Seated March - 1-2 x daily - 5 x weekly - 2 sets - 10 reps - cues for incr intensity of marching  Seated Long Arc Quad - 1-2 x daily - 5 x weekly - 2 sets - 10 reps -cues for technique  Hooklying Single Leg Bent Knee Fallouts with Resistance - 1 x daily - 5 x weekly - 2 sets - 10 reps - use of green theraband  Supine Bridge - 1 x daily - 5 x weekly - 2 sets - 10 reps       Pt performs PWR! Moves in supine position:    PWR! Step for improved step initiation for improved scooting for bed mobility - x5 reps B out and then in, then progressing to x5 reps B for step out and out and then BIG scoot with lifting up hips, pt needing verbal and demo cues for proper technique,  utilized this method of scooting when performing bed mobility      Bed Mobility: performed supine > sit, first performing with R sidelying and then coming up to sit with cues for rolling and pushing up to sit from sidelying position with min guard for balance, performed an additional x2 reps with supine > L sidelying and then to sit (due to pt using this way to get out of bed at home), needed cues for trunk rotation with rolling and cues for intensity to push up to sitting, with needing min guard. At home pt's spouse states that he skips the sitting and will go straight from rolling > standing.    [facility] Adult PT Treatment/Exercise - 02/26/20 0001      Transfers   Transfers  Sit to Stand;Stand to Sit    Sit to Stand  4: Min guard;With upper extremity assist;From chair/3-in-1;From bed    Stand to Sit  4: Min guard;With upper extremity assist;To chair/3-in-1    Transfer Cueing  when performing turns to come to sit to mat, cues for weight shifting and taking big steps while turning    Comments  Reviewed sit <> stand transfer training from chair with verbal and demo cues for scooting forward, bringing BLE back under him and nose over toes for forward weight shift. Use of BUE support on arm rests to push up to stand. Cues for slowed and controlled with reaching back towards arm rest to come to sit. Performed x10 reps, plus additional reps throughout session.              PT Education - 02/26/20 1541    Education Details  reviewed prior HEP/new additions for functional LE strengthening, sit <> stand transfers, bed mobility    Person(s) Educated  Patient;Spouse    Methods  Explanation;Demonstration;Verbal cues    Comprehension  Verbalized understanding;Returned demonstration;Verbal cues required;Need further instruction       PT Short Term Goals - 02/15/20 1840      PT SHORT TERM GOAL #1   Title  Pt will perform HEP for strength, flexibility, balance and gait with wife's supervision.   TARGET 03/18/2020    Time  5    Period  Weeks    Status  New      PT SHORT TERM GOAL #2   Title  Pt will perform  at least 4 of 5 reps of sit<>stand, demonstrating proper sit<>stand technique with supervision, for improved functional strength and safety/efficiency with transfers.    Time  5    Period  Weeks    Status  New      PT SHORT TERM GOAL #3   Title  Pt will improve TUG score to less than or equal to 30 seconds for decreased fall risk.    Baseline  36.6 sec at eval    Time  5    Period  Weeks    Status  New      PT SHORT TERM GOAL #4   Title  Pt/wife will report at least 25% improvement in bed mobility, for improved safety/decreased caregiver burden with bed moiblity.    Time  5    Period  Weeks    Status  New        PT Long Term Goals - 02/15/20 1843      PT LONG TERM GOAL #1   Title  Pt/wife will verbalize understanding of fall prevention, including tips to reduce freezing.  TARGET 04/15/2020    Time  9    Period  Weeks    Status  New      PT LONG TERM GOAL #2   Title  Wife will demonstrate/verbalize appropriate cueing and assistance with pt and RW for safety with doorways and threshold negotiation.    Time  9    Period  Weeks    Status  New      PT LONG TERM GOAL #3   Title  Pt will improve gait velocity to at least 2.3 ft/sec for improved efficiency and safety with gait.    Baseline  2.1 ft at eval    Time  9    Period  Weeks    Status  New      PT LONG TERM GOAL #4   Title  Pt/wife will verbalize/demo understanding of safety with walker negotiation on outdoor surfaces and unlevel surfaces, at least 500 ft, for improved participation in community ambulation.    Time  9    Period  Weeks    Status  New            Plan - 02/26/20 1551    Clinical Impression Statement  Focus of today's skilled session focused on reviewing prior HEP and adding new exercises, sit <> stand training, and bed mobility. Pt initially demonstrating poor eccentric control when  sitting back into chair, however with incr reps and cueing pt able to demonstrate proper technique for sit <> stands with min guard from therapist. Initiated supine PWR! step for improved scooting with bed mobility with pt needing cues for incr intensity. Will continue to progress towards LTGs.    Personal Factors and Comorbidities  Comorbidity 3+    Comorbidities  GERD, HTN DDD cervical spine, arthritis, OSA    Examination-Activity Limitations  Bed Mobility;Locomotion Level;Transfers;Stand    Examination-Participation Restrictions  Community Activity    Stability/Clinical Decision Making  Evolving/Moderate complexity    Rehab Potential  Good    PT Frequency  1x / week    PT Duration  Other (comment)   8 weeks, plus eval visit; total POC = 9 weeks   PT Treatment/Interventions  ADLs/Self Care Home Management;DME Instruction;Neuromuscular re-education;Balance training;Therapeutic exercise;Therapeutic activities;Functional mobility training;Stair training;Gait training;Patient/family education    PT Next Visit Plan  Review transfer training, work on bed mobility - maybe try supine PWR moves?; safety education with  gait-doorways, thresholds, varied surfaces; add to HEP when appropriate for lower extremity strengthening, balance    PT Home Exercise Plan  [email or web address]    Consulted and Agree with Plan of Care  Patient;Family member/caregiver    Family Member Consulted  wife, [name]       Patient will benefit from skilled therapeutic intervention in order to improve the following deficits and impairments:  Abnormal gait, Difficulty walking, Decreased safety awareness, Decreased balance, Decreased mobility, Decreased strength, Postural dysfunction  Visit Diagnosis: Other symptoms and signs involving the nervous system  Unsteadiness on feet  Other abnormalities of gait and mobility  Abnormal posture     Problem List Patient Active Problem List   Diagnosis Date Noted  . Hypothyroidism 02/18/2018   . Peripheral neuropathy 11/14/2016  . PSP (progressive supranuclear palsy) ([place]) 11/08/2016  . Tubular adenoma of colon 01/23/2016  . Lumbar stenosis 06/06/2015  . HOCM (hypertrophic obstructive cardiomyopathy) ([place]) 12/12/2014  . Testosterone deficiency 11/12/2013  . Mixed hyperlipidemia 08/30/2013  . 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 Parkinsonism ([place]) 01/16/2013  . GLAUCOMA 06/01/2008  . Essential hypertension 06/01/2008  . Seasonal and perennial allergic rhinitis 06/01/2008    Arliss Journey, PT, DPT  02/26/2020, 3:54 PM  [facility] [address] [place], [place], [address] Phone: [phone]   Fax:  [phone]  Name: [name] MRN: [id number] Date of Birth: October 01, 1946

## 2020-03-03 ENCOUNTER — Ambulatory Visit: Payer: Medicare Other | Admitting: Physical Therapy

## 2020-03-04 ENCOUNTER — Other Ambulatory Visit: Payer: Self-pay

## 2020-03-04 ENCOUNTER — Ambulatory Visit: Payer: Medicare Other

## 2020-03-04 ENCOUNTER — Ambulatory Visit: Payer: Medicare Other | Admitting: Physical Therapy

## 2020-03-04 DIAGNOSIS — R2681 Unsteadiness on feet: Secondary | ICD-10-CM

## 2020-03-04 DIAGNOSIS — R29818 Other symptoms and signs involving the nervous system: Secondary | ICD-10-CM | POA: Diagnosis not present

## 2020-03-04 DIAGNOSIS — R471 Dysarthria and anarthria: Secondary | ICD-10-CM

## 2020-03-04 DIAGNOSIS — R2689 Other abnormalities of gait and mobility: Secondary | ICD-10-CM | POA: Diagnosis not present

## 2020-03-04 DIAGNOSIS — R41841 Cognitive communication deficit: Secondary | ICD-10-CM | POA: Diagnosis not present

## 2020-03-04 DIAGNOSIS — R1310 Dysphagia, unspecified: Secondary | ICD-10-CM

## 2020-03-04 DIAGNOSIS — R293 Abnormal posture: Secondary | ICD-10-CM

## 2020-03-04 NOTE — Therapy (Signed)
[facility] [address] [place], [place], [address] Phone: [phone]   Fax:  [phone]  Speech Language Pathology Treatment  Patient Details  Name: [name] MRN: [id number] Date of Birth: 1946/01/11 Referring Provider (SLP): Star Age, MD   Encounter Date: 03/04/2020  End of Session - 03/04/20 1728    Visit Number  2    Number of Visits  9    Date for SLP Re-Evaluation  05/13/20    SLP Start Time  33    SLP Stop Time   1400    SLP Time Calculation (min)  42 min    Activity Tolerance  Patient tolerated treatment well       Past Medical History:  Diagnosis Date  . Allergic rhinitis   . Allergy   . Arthritis   . At risk for falls   . Cancer ([place])    basal cell skin cancer  . Cataract   . Complication of anesthesia    Slow to awaken  . Constipation   . DDD (degenerative disc disease), cervical   . Dysphagia   . Edema extremities 01/13/2016  . GERD (gastroesophageal reflux disease)    Decdreased, has decreased coffee intack.  . Glaucoma   . HTN (hypertension)   . Hx of adenomatous colonic polyps 01/23/2016  . Hyperlipidemia   . Hypogonadism male   . Hypothyroidism   . OSA (obstructive sleep apnea)    Has had surgery x 2  . Parkinsonism ([place]) 01/16/2013   no tremors, balance issues only , speech and swallowing difficulties   . Pre-diabetes   . Sleep apnea    surgery x 2- no cpap  . Staph infection 10-2014  . Thyroid disease    HYPOTHYROID    Past Surgical History:  Procedure Laterality Date  . CATARACT EXTRACTION    . COLONOSCOPY  04-27-2004   tics, hems   . I & D EXTREMITY Right 10/18/2014   Procedure: IRRIGATION AND DEBRIDEMENT RIGHT KNEE PREPATELLA BURSA INFECTION;  Surgeon: [name], MD;  Location: [facility];  Service: Orthopedics;  Laterality: Right;  . [place] SURGERY  04-2015  . LUMBAR LAMINECTOMY WITH COFLEX 1 LEVEL N/A 06/06/2015   Procedure: Lumbar three-four  Laminectomy/Foraminotomy with placement of Coflex;  Surgeon: [name], MD;  Location: [facility];  Service: Neurosurgery;  Laterality: N/A;  . NASAL SEPTUM SURGERY    . TONSILLECTOMY     and adenoids  . UVULOPALATOPHARYNGOPLASTY      There were no vitals filed for this visit.  Subjective Assessment - 03/04/20 1320    Subjective  "Early" ("gurgly?" - about his vocal quality during POs)    Patient is accompained by:  Family member   [name] - daughter   Currently in Pain?  No/denies            ADULT SLP TREATMENT - 03/04/20 1339      General Information   Behavior/Cognition  Alert;Pleasant mood;Impulsive      Treatment Provided   Treatment provided  Dysphagia      Dysphagia Treatment   Temperature Spikes Noted  No    Respiratory Status  Room air    Oral Cavity - Dentition  Adequate natural dentition    Treatment Methods  Skilled observation;Compensation strategy training;Patient/caregiver education    Patient observed directly with PO's  Yes    Type of PO's observed  Dysphagia 3 (soft);Nectar-thick liquids    Feeding  Able to feed self with  adaptive devices   small fork and spoon   Liquids provided via  Straw    Pharyngeal Phase Signs & Symptoms  Delayed cough;Watery eyes;Wet vocal quality    Type of cueing  Tactile;Verbal;Visual   occasional faded to rare and then occasional as fatige incr'   Other treatment/comments  Pt brought baked vegetables for PO consult with SLP today. SLP asked pt what he needed to pay attention to during eating and pt stated "small" - SLP wrote "small bites and sips", "slow down", and "chew and swallow". Initially SLP req'd to cue pt for slower pace as he put 4 bites into his mouth without swallowing. After initial cue for slower pace pt maintained this for remainder of meal with reminders 3/23 bites. Putting fork down between bites was effective compensatory measure. Pt took multiple sips (5) of nectar liquid with first liquid PO - SLP cued  pt to "sip and swallow" which pt did independently for remainder of session. After initial swallow of liquid, pt became hydrophonic immediately following so SLP cued pt for second swallow which was clear each time next x4 sips with the cued dry swallow. After approx 5 sips pt independent with dry swallow for liquids and pt also noted to spontaneously achieve second swallow with solids as well. Approx 15 minutes into meal pt coughed more frequently - SLP noted pt had be cued more intermittently for third swallow due to hydrophonic voice. SLP educated daughter and re-educated pt on throat clear/reswallow with "gurgly" voice. SLP consistently hd to cue pt for stronger throat clear - almost ineffective. After 15-17 minutes of meal, pt laryngeal elevation was reduced and this coincided with incr'd frequency coughing. SLP told pt and daughter that should this occur at home SLP suggests taking a rest for 5-7 mintues and then if pt cont hungry to cont to eat/drink at that time. PT stated he eats much faster at home - and stated his wife sitting in front of him may or may not make him slow down. SLP reiterated the rationale for swalow precautions.       Assessment / Recommendations / Plan   Plan  Continue with current plan of care;Goals updated   updated if necessary     Dysphagia Recommendations   Diet recommendations  Dysphagia 3 (mechanical soft);Nectar-thick liquid;Dysphagia 2 (fine chop);Dysphagia 1 (puree)    Liquids provided via  Straw;Cup   small straw if possible   Medication Administration  --   as suggested on last MBSS   Supervision  Intermittent supervision to cue for compensatory strategies    Compensations  Slow rate;Small sips/bites;Multiple dry swallows after each bite/sip;Clear throat intermittently      Progression Toward Goals   Progression toward goals  Progressing toward goals       SLP Education - 03/04/20 1727    Education provided  Yes    Education Details  rationale for  precautions, slow pace, smll biets/sips, additional swallow with liquids, take a break if pt coughing frequently    Person(s) Educated  Patient;Child(ren)    Methods  Explanation;Verbal cues;Tactile cues    Comprehension  Verbalized understanding;Returned demonstration;Verbal cues required;Need further instruction         SLP Long Term Goals - 03/04/20 1730      SLP LONG TERM GOAL #1   Title  pt will produce 80% of his chosen communicative phrases/sentences with functional intelligibility in 3 sessions    Time  8    Period  Weeks   or  visits, for all LTGs   Status  On-going      SLP LONG TERM GOAL #2   Title  pt will indicate understanding of 4 s/s aspiration PNA    Time  8    Period  Weeks   or visits   Status  On-going      SLP LONG TERM GOAL #3   Title  to benefit pt's pulmonary health, wife will demo appropriate cueing for pt during POs over two sessions    Time  8    Period  Weeks   or visits   Status  On-going      SLP LONG TERM GOAL #4   Title  in 3 sessions, pt communicative partner/wife will demo appropriate compensatory measures to assist in pt-listener communication    Time  8    Period  Weeks   or visits   Status  On-going       Plan - 03/04/20 1728    Clinical Impression Statement  Pt presents today with significant decline in speech intelligibility from previous course of therapy 3 years ago. Pt cont with cognitive linguistic deficits at least at same levels as noted in prior ST courses. Pt's swallow with food and drink from home completed today - rec dys 1,2,3 items with nectar liquids with small bites/sips, extra dry swallow iwht solids and liquids, slow pace, hand over hand assist for pace management. Skilled speech therapy will focus mainly on intelligibility of pt's functional language, and compensatory strategies for wife to use with pt to maximize his communication, and safety with POs.    Speech Therapy Frequency  1x /week   pt desired once/week as  opposed to x2/week   Duration  --   8 weeks/9 total sessions   Treatment/Interventions  Aspiration precaution training;Diet toleration management by SLP;Cueing hierarchy;Patient/family education;SLP instruction and feedback;Internal/external aids;Compensatory techniques;Environmental controls;Pharyngeal strengthening exercises;Language facilitation;Cognitive reorganization;Functional tasks;Trials of upgraded texture/liquids;Multimodal communcation approach;Oral motor exercises    Potential to Achieve Goals  Fair    Potential Considerations  Severity of impairments;Previous level of function;Medical prognosis;Cooperation/participation level;Ability to learn/carryover information    Consulted and Agree with Plan of Care  Patient;Family member/caregiver    Family Member Consulted  wife [name]       Patient will benefit from skilled therapeutic intervention in order to improve the following deficits and impairments:   Dysphagia, unspecified type  Dysarthria and anarthria  Cognitive communication deficit    Problem List Patient Active Problem List   Diagnosis Date Noted  . Hypothyroidism 02/18/2018  . Peripheral neuropathy 11/14/2016  . PSP (progressive supranuclear palsy) ([place]) 11/08/2016  . Tubular adenoma of colon 01/23/2016  . Lumbar stenosis 06/06/2015  . HOCM (hypertrophic obstructive cardiomyopathy) ([place]) 12/12/2014  . Testosterone deficiency 11/12/2013  . Mixed hyperlipidemia 08/30/2013  . 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 Parkinsonism ([place]) 01/16/2013  . GLAUCOMA 06/01/2008  . Essential hypertension 06/01/2008  . Seasonal and perennial allergic rhinitis 06/01/2008    [facility] ,[place], CCC-SLP  03/04/2020, 5:31 PM  [facility] [address] [place], [place], [address] Phone: [phone]   Fax:  [phone]   Name: [name] MRN: [id number] Date of Birth: 12/29/1945

## 2020-03-04 NOTE — Therapy (Signed)
[facility] [address] [place], [place], [address] Phone: [phone]   Fax:  [phone]  Physical Therapy Treatment  Patient Details  Name: [name] MRN: [id number] Date of Birth: 1946-04-28 Referring Provider (PT): Star Age   Encounter Date: 03/04/2020  PT End of Session - 03/04/20 1448    Visit Number  3    Number of Visits  9    Date for PT Re-Evaluation  123456   90 day cert for 9 week POC   Authorization Type  Medicare/BCBS    Progress Note Due on Visit  10    PT Start Time  1403    PT Stop Time  1445    PT Time Calculation (min)  42 min    Equipment Utilized During Treatment  Gait belt    Activity Tolerance  Patient tolerated treatment well    Behavior During Therapy  [facility] for tasks assessed/performed;Impulsive   impulsive at times with transfers      Past Medical History:  Diagnosis Date  . Allergic rhinitis   . Allergy   . Arthritis   . At risk for falls   . Cancer ([place])    basal cell skin cancer  . Cataract   . Complication of anesthesia    Slow to awaken  . Constipation   . DDD (degenerative disc disease), cervical   . Dysphagia   . Edema extremities 01/13/2016  . GERD (gastroesophageal reflux disease)    Decdreased, has decreased coffee intack.  . Glaucoma   . HTN (hypertension)   . Hx of adenomatous colonic polyps 01/23/2016  . Hyperlipidemia   . Hypogonadism male   . Hypothyroidism   . OSA (obstructive sleep apnea)    Has had surgery x 2  . Parkinsonism ([place]) 01/16/2013   no tremors, balance issues only , speech and swallowing difficulties   . Pre-diabetes   . Sleep apnea    surgery x 2- no cpap  . Staph infection 10-2014  . Thyroid disease    HYPOTHYROID    Past Surgical History:  Procedure Laterality Date  . CATARACT EXTRACTION    . COLONOSCOPY  04-27-2004   tics, hems   . I & D EXTREMITY Right 10/18/2014   Procedure: IRRIGATION AND DEBRIDEMENT RIGHT KNEE  PREPATELLA BURSA INFECTION;  Surgeon: [name], MD;  Location: [facility];  Service: Orthopedics;  Laterality: Right;  . [place] SURGERY  04-2015  . LUMBAR LAMINECTOMY WITH COFLEX 1 LEVEL N/A 06/06/2015   Procedure: Lumbar three-four Laminectomy/Foraminotomy with placement of Coflex;  Surgeon: [name], MD;  Location: [facility];  Service: Neurosurgery;  Laterality: N/A;  . NASAL SEPTUM SURGERY    . TONSILLECTOMY     and adenoids  . UVULOPALATOPHARYNGOPLASTY      There were no vitals filed for this visit.  Subjective Assessment - 03/04/20 1407    Subjective  Has had 2 falls in the past week - was going through the door frame to go to the bathroom, pt states he lost his concentration and fell. Scraped his R knee.    Patient is accompained by:  Family member   wife   Pertinent History  PSP, hx of falls    Patient Stated Goals  See above for wife's list of things to work on-pt agreeable to these goals    Currently in Pain?  No/denies  [facility] Adult PT Treatment/Exercise - 03/04/20 1445      Transfers   Transfers  Sit to Stand;Stand to Sit    Sit to Stand  4: Min guard;With upper extremity assist;From bed;From chair/3-in-1    Stand to Sit  4: Min guard;With upper extremity assist;To chair/3-in-1;To bed    Transfer Cueing  multiple reps of sit <> stands performed throughout session from mat table and chair. needed reminder cues for scooting towards edge, tucking feet underneath him and for nose over toes - reviewed these verbal cues with pt's daughter to perform for safety with transfers at home. cues to reach posteriorly while sitting for slowed descent. when pt performs without adhering to cues/technique, pt has incr BLE bracing against mat table    Comments  when performing turns to sit at chair/mat table, cues for weight shifting and taking big steps while turning. during one instance pt got stuck, reviewed techniques to help assist with  freezing, educated on stopping, getting tall, and weight shifting and then taking a big step to initiate movement       Ambulation/Gait   Ambulation/Gait  Yes    Ambulation/Gait Assistance  4: Min guard;4: Min assist    Ambulation/Gait Assistance Details  outdoors over paved surfaces with RW, cues for posture and to stay close to RW, esp going up inclines. cues for slowed sped down inclines and when approaching uneven cracks in the pavement. pt needing 2 episodes of min A for balance due to approaching a crack too quickly and almost getting RW stuck     Ambulation Distance (Feet)  115 Feet    Assistive device  Rolling walker    Gait Pattern  Step-through pattern;Decreased step length - right;Decreased step length - left;Decreased dorsiflexion - right;Right steppage   lean on RW   Ambulation Surface  Level;Indoor;Outdoor;Paved    Gait Comments  performed 4 bouts of gait of 30' through doorway and into kitchen - turning and sitting in chair and performing sit <> stand and ambulating back out through doorway to come back to sit to mat table, pt with no episodes of freezing      Neuro Re-ed    Neuro Re-ed Details   standing at RW with wide BOS: 2 x 10 reps lateral weight shifting with cues for posture, discussed using this technique during freezing episodes to weight shift before taking  BIG step      Exercises   Exercises  Other Exercises    Other Exercises   therapist performing seated hamstring/calf stretch at end of session 2 x 30 seconds B       Bed Mobility: -utilizing chair armrest as pt's bed rail for home, performed sit > L sidelying(using one UE on rail) > supine and then supine > L sidelying > sit x2 reps. Cues provided for hand placement and big push through BUE when coming up to sitting  PT Education - 03/04/20 1448    Education Details  transfer training for sit <> stands, reviewed strategies during freezing episodes    Person(s) Educated  Patient;Child(ren)   daughter    Methods  Explanation;Demonstration;Verbal cues    Comprehension  Verbalized understanding;Returned demonstration;Verbal cues required;Need further instruction       PT Short Term Goals - 02/15/20 1840      PT SHORT TERM GOAL #1   Title  Pt will perform HEP for strength, flexibility, balance and gait with wife's supervision.  TARGET 03/18/2020    Time  5  Period  Weeks    Status  New      PT SHORT TERM GOAL #2   Title  Pt will perform at least 4 of 5 reps of sit<>stand, demonstrating proper sit<>stand technique with supervision, for improved functional strength and safety/efficiency with transfers.    Time  5    Period  Weeks    Status  New      PT SHORT TERM GOAL #3   Title  Pt will improve TUG score to less than or equal to 30 seconds for decreased fall risk.    Baseline  36.6 sec at eval    Time  5    Period  Weeks    Status  New      PT SHORT TERM GOAL #4   Title  Pt/wife will report at least 25% improvement in bed mobility, for improved safety/decreased caregiver burden with bed moiblity.    Time  5    Period  Weeks    Status  New        PT Long Term Goals - 02/15/20 1843      PT LONG TERM GOAL #1   Title  Pt/wife will verbalize understanding of fall prevention, including tips to reduce freezing.  TARGET 04/15/2020    Time  9    Period  Weeks    Status  New      PT LONG TERM GOAL #2   Title  Wife will demonstrate/verbalize appropriate cueing and assistance with pt and RW for safety with doorways and threshold negotiation.    Time  9    Period  Weeks    Status  New      PT LONG TERM GOAL #3   Title  Pt will improve gait velocity to at least 2.3 ft/sec for improved efficiency and safety with gait.    Baseline  2.1 ft at eval    Time  9    Period  Weeks    Status  New      PT LONG TERM GOAL #4   Title  Pt/wife will verbalize/demo understanding of safety with walker negotiation on outdoor surfaces and unlevel surfaces, at least 500 ft, for improved  participation in community ambulation.    Time  9    Period  Weeks    Status  New            Plan - 03/04/20 1459    Clinical Impression Statement  Focus of today's skilled session continued to focus on gait training with RW, sit <> stand transfers, and bed mobility. Pt needs frequent verbal and demo cues of proper sit <> stand technique to avoid retropulsion and bracing BLE against mat when standing. Reviewed cues for pt's daughter to perform with pt at home. Also reviewed proper technique when pt demonstrates freezing when turning, needed verbal and demo cues. Pt needing a couple instances of min A with gait with RW outdoors due to pt ambulating too quickly over the pavement and not paying attention to cracks in the pavement. Needed frequent cues to slow down and remain in RW. Will continue to progress towards LTGs.    Personal Factors and Comorbidities  Comorbidity 3+    Comorbidities  GERD, HTN DDD cervical spine, arthritis, OSA    Examination-Activity Limitations  Bed Mobility;Locomotion Level;Transfers;Stand    Examination-Participation Restrictions  Community Activity    Stability/Clinical Decision Making  Evolving/Moderate complexity    Rehab Potential  Good    PT Frequency  1x / week    PT Duration  Other (comment)   8 weeks, plus eval visit; total POC = 9 weeks   PT Treatment/Interventions  ADLs/Self Care Home Management;DME Instruction;Neuromuscular re-education;Balance training;Therapeutic exercise;Therapeutic activities;Functional mobility training;Stair training;Gait training;Patient/family education    PT Next Visit Plan  Review transfer training, work on bed mobility, gait outdoors. safety education with gait-doorways, thresholds, varied surfaces; add to HEP when appropriate for lower extremity strengthening, balance    PT Home Exercise Plan  [id number]    Consulted and Agree with Plan of Care  Patient;Family member/caregiver    Family Member Consulted  wife, [name]        Patient will benefit from skilled therapeutic intervention in order to improve the following deficits and impairments:  Abnormal gait, Difficulty walking, Decreased safety awareness, Decreased balance, Decreased mobility, Decreased strength, Postural dysfunction  Visit Diagnosis: Other symptoms and signs involving the nervous system  Unsteadiness on feet  Other abnormalities of gait and mobility  Abnormal posture     Problem List Patient Active Problem List   Diagnosis Date Noted  . Hypothyroidism 02/18/2018  . Peripheral neuropathy 11/14/2016  . PSP (progressive supranuclear palsy) ([place]) 11/08/2016  . Tubular adenoma of colon 01/23/2016  . Lumbar stenosis 06/06/2015  . HOCM (hypertrophic obstructive cardiomyopathy) ([place]) 12/12/2014  . Testosterone deficiency 11/12/2013  . Mixed hyperlipidemia 08/30/2013  . 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 Parkinsonism ([place]) 01/16/2013  . GLAUCOMA 06/01/2008  . Essential hypertension 06/01/2008  . Seasonal and perennial allergic rhinitis 06/01/2008    Arliss Journey, PT, DPT  03/04/2020, 3:01 PM  [facility] [address] [place], [place], [address] Phone: [phone]   Fax:  [phone]  Name: Taevion Holtorf MRN: [id number] Date of Birth: 1946/08/20

## 2020-03-09 ENCOUNTER — Encounter: Payer: Self-pay | Admitting: Occupational Therapy

## 2020-03-09 ENCOUNTER — Other Ambulatory Visit: Payer: Self-pay

## 2020-03-09 ENCOUNTER — Ambulatory Visit: Payer: Medicare Other | Attending: Family Medicine | Admitting: Physical Therapy

## 2020-03-09 ENCOUNTER — Ambulatory Visit: Payer: Medicare Other | Admitting: Occupational Therapy

## 2020-03-09 ENCOUNTER — Encounter: Payer: Self-pay | Admitting: Physical Therapy

## 2020-03-09 DIAGNOSIS — R41840 Attention and concentration deficit: Secondary | ICD-10-CM | POA: Diagnosis present

## 2020-03-09 DIAGNOSIS — R41844 Frontal lobe and executive function deficit: Secondary | ICD-10-CM

## 2020-03-09 DIAGNOSIS — R29818 Other symptoms and signs involving the nervous system: Secondary | ICD-10-CM

## 2020-03-09 DIAGNOSIS — R1310 Dysphagia, unspecified: Secondary | ICD-10-CM | POA: Insufficient documentation

## 2020-03-09 DIAGNOSIS — R41842 Visuospatial deficit: Secondary | ICD-10-CM | POA: Diagnosis present

## 2020-03-09 DIAGNOSIS — R2681 Unsteadiness on feet: Secondary | ICD-10-CM | POA: Insufficient documentation

## 2020-03-09 DIAGNOSIS — R293 Abnormal posture: Secondary | ICD-10-CM | POA: Insufficient documentation

## 2020-03-09 DIAGNOSIS — R41841 Cognitive communication deficit: Secondary | ICD-10-CM | POA: Diagnosis present

## 2020-03-09 DIAGNOSIS — R2689 Other abnormalities of gait and mobility: Secondary | ICD-10-CM | POA: Insufficient documentation

## 2020-03-09 DIAGNOSIS — R471 Dysarthria and anarthria: Secondary | ICD-10-CM | POA: Diagnosis present

## 2020-03-09 NOTE — Therapy (Signed)
[facility] [address] [place], [place], [address] Phone: [phone]   Fax:  [phone]  Physical Therapy Treatment  Patient Details  Name: [name] MRN: [id number] Date of Birth: [birth year] Referring Provider (PT): Star Age   Encounter Date: 03/09/2020  PT End of Session - 03/09/20 1358    Visit Number  4    Number of Visits  9    Date for PT Re-Evaluation  123456   90 day cert for 9 week POC   Authorization Type  Medicare/BCBS    Progress Note Due on Visit  10    PT Start Time  [id number]    PT Stop Time  1357    PT Time Calculation (min)  40 min    Equipment Utilized During Treatment  Gait belt    Activity Tolerance  Patient tolerated treatment well;Other (comment)   mild dizziness reported after bed mobility   Behavior During Therapy  [facility] for tasks assessed/performed;Impulsive   impulsive at times with transfers      Past Medical History:  Diagnosis Date  . Allergic rhinitis   . Allergy   . Arthritis   . At risk for falls   . Cancer ([place])    basal cell skin cancer  . Cataract   . Complication of anesthesia    Slow to awaken  . Constipation   . DDD (degenerative disc disease), cervical   . Dysphagia   . Edema extremities 01/13/2016  . GERD (gastroesophageal reflux disease)    Decdreased, has decreased coffee intack.  . Glaucoma   . HTN (hypertension)   . Hx of adenomatous colonic polyps 01/23/2016  . Hyperlipidemia   . Hypogonadism male   . Hypothyroidism   . OSA (obstructive sleep apnea)    Has had surgery x 2  . Parkinsonism ([place]) 01/16/2013   no tremors, balance issues only , speech and swallowing difficulties   . Pre-diabetes   . Sleep apnea    surgery x 2- no cpap  . Staph infection 10-2014  . Thyroid disease    HYPOTHYROID    Past Surgical History:  Procedure Laterality Date  . CATARACT EXTRACTION    . COLONOSCOPY  04-27-2004   tics, hems   . I & D EXTREMITY Right 10/18/2014    Procedure: IRRIGATION AND DEBRIDEMENT RIGHT KNEE PREPATELLA BURSA INFECTION;  Surgeon: [name], MD;  Location: [facility];  Service: Orthopedics;  Laterality: Right;  . [place] SURGERY  04-2015  . LUMBAR LAMINECTOMY WITH COFLEX 1 LEVEL N/A 06/06/2015   Procedure: Lumbar three-four Laminectomy/Foraminotomy with placement of Coflex;  Surgeon: [name], MD;  Location: [facility];  Service: Neurosurgery;  Laterality: N/A;  . NASAL SEPTUM SURGERY    . TONSILLECTOMY     and adenoids  . UVULOPALATOPHARYNGOPLASTY      There were no vitals filed for this visit.  Subjective Assessment - 03/09/20 1319    Subjective  No falls - was feeling tired after last session.    Patient is accompained by:  Family member   wife   Pertinent History  PSP, hx of falls    Patient Stated Goals  See above for wife's list of things to work on-pt agreeable to these goals    Currently in Pain?  --   lower back was hurting this morning, but not so much anymore                  Therapeutic Activity: -  Bed Mobility: practiced sit > supine, with cues to perform rocking side to side and going down on the count of 3 to help initiate bigger movement patterns, when lying in supine performed x10 reps of lower trunk rotations, initially with therapist providing min A and then pt performing an additional x5 reps with cues for larger intensity to help assist with rolling to L sidelying, from L sidelying cues for big push through hands and LUE to come back up to sitting, performed x3 reps with focus on rocking for larger movement for incr ease of bed mobility. Pt reporting mild dizziness after the last rep that subsided after a brief rest. (per pt's wife this is normal for him and has occurred in the past).        03/09/20 0001  Transfers  Transfers Sit to Stand;Stand to Sit  Sit to Stand 4: Min guard;With upper extremity assist;From bed;From chair/3-in-1  Stand to Sit 4: Min guard;With upper extremity  assist;To chair/3-in-1;To bed  Transfer Cueing multiple reps throughout session from mat table and standard height chair, pt needing verbal and demo cues for proper technique and to slow down, as pt tends to perform too impulsively at times and then that is when pt loses balance posteriorly back into chair. cues to first scoot towards edge, bring feet back under him, and anterior weight shift, pt needs to push from chair with BUE   Comments TUG shuttle: performed sit <> stand from standard height chair, ambulated approx. 10-15' to another standard height chair to practice turning and to sit back down. pt needing cues for turning for  larger steps and pt demonstrating freezing, needed verbal cues to STOP and reset and get tall and shift weight R/L before taking a big step to complete turn x4 reps   Ambulation/Gait  Ambulation/Gait Yes  Ambulation/Gait Assistance 4: Min guard;4: Min assist  Ambulation/Gait Assistance Details pt requesting to ambulate outdoors - outdoors over paved surfaces, needed verbal cues and assist at RW at times to slow down especially when approaching cracks in the pavement, cues for posture and to stay close to RW when ambulating up inclines   Ambulation Distance (Feet) 500 Feet  Assistive device Rolling walker  Gait Pattern Step-through pattern;Decreased step length - right;Decreased step length - left;Decreased dorsiflexion - right;Right steppage  Ambulation Surface Unlevel;Outdoor;Paved            PT Short Term Goals - 02/15/20 1840      PT SHORT TERM GOAL #1   Title  Pt will perform HEP for strength, flexibility, balance and gait with wife's supervision.  TARGET 03/18/2020    Time  5    Period  Weeks    Status  New      PT SHORT TERM GOAL #2   Title  Pt will perform at least 4 of 5 reps of sit<>stand, demonstrating proper sit<>stand technique with supervision, for improved functional strength and safety/efficiency with transfers.    Time  5    Period  Weeks     Status  New      PT SHORT TERM GOAL #3   Title  Pt will improve TUG score to less than or equal to 30 seconds for decreased fall risk.    Baseline  36.6 sec at eval    Time  5    Period  Weeks    Status  New      PT SHORT TERM GOAL #4   Title  Pt/wife will report at  least 25% improvement in bed mobility, for improved safety/decreased caregiver burden with bed moiblity.    Time  5    Period  Weeks    Status  New        PT Long Term Goals - 02/15/20 1843      PT LONG TERM GOAL #1   Title  Pt/wife will verbalize understanding of fall prevention, including tips to reduce freezing.  TARGET 04/15/2020    Time  9    Period  Weeks    Status  New      PT LONG TERM GOAL #2   Title  Wife will demonstrate/verbalize appropriate cueing and assistance with pt and RW for safety with doorways and threshold negotiation.    Time  9    Period  Weeks    Status  New      PT LONG TERM GOAL #3   Title  Pt will improve gait velocity to at least 2.3 ft/sec for improved efficiency and safety with gait.    Baseline  2.1 ft at eval    Time  9    Period  Weeks    Status  New      PT LONG TERM GOAL #4   Title  Pt/wife will verbalize/demo understanding of safety with walker negotiation on outdoor surfaces and unlevel surfaces, at least 500 ft, for improved participation in community ambulation.    Time  9    Period  Weeks    Status  New            Plan - 03/10/20 0914    Clinical Impression Statement  Focus of today's skilled session continued to focus on gait training, safety with sit <> stand transfers and turning and bed mobility. Pt reporting mild dizziness after performing sit <> supine after a couple reps, but it subsided with brief seated rest break with no other report of dizziness throughout session. Pt continues to need cues for sit <> stand transfer, especially to slow down and take it step by step, as pt with tendency to perform too quickly and fall backwards into chair. Will  continue to progress towards LTGs.    Personal Factors and Comorbidities  Comorbidity 3+    Comorbidities  GERD, HTN DDD cervical spine, arthritis, OSA    Examination-Activity Limitations  Bed Mobility;Locomotion Level;Transfers;Stand    Examination-Participation Restrictions  Community Activity    Stability/Clinical Decision Making  Evolving/Moderate complexity    Rehab Potential  Good    PT Frequency  1x / week    PT Duration  Other (comment)   8 weeks, plus eval visit; total POC = 9 weeks   PT Treatment/Interventions  ADLs/Self Care Home Management;DME Instruction;Neuromuscular re-education;Balance training;Therapeutic exercise;Therapeutic activities;Functional mobility training;Stair training;Gait training;Patient/family education    PT Next Visit Plan  STGs due 6/11. Review transfer training, work on bed mobility, gait outdoors. safety education with gait-doorways, thresholds, varied surfaces; add to HEP when appropriate for lower extremity strengthening, balance    PT Home Exercise Plan  [id number]    Consulted and Agree with Plan of Care  Patient;Family member/caregiver    Family Member Consulted  wife, [name]       Patient will benefit from skilled therapeutic intervention in order to improve the following deficits and impairments:  Abnormal gait, Difficulty walking, Decreased safety awareness, Decreased balance, Decreased mobility, Decreased strength, Postural dysfunction  Visit Diagnosis: Unsteadiness on feet  Other symptoms and signs involving the nervous system  Other abnormalities of gait  and mobility  Abnormal posture     Problem List Patient Active Problem List   Diagnosis Date Noted  . Hypothyroidism 02/18/2018  . Peripheral neuropathy 11/14/2016  . PSP (progressive supranuclear palsy) ([place]) 11/08/2016  . Tubular adenoma of colon 01/23/2016  . Lumbar stenosis 06/06/2015  . HOCM (hypertrophic obstructive cardiomyopathy) ([place]) 12/12/2014  . Testosterone deficiency  11/12/2013  . Mixed hyperlipidemia 08/30/2013  . 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 Parkinsonism ([place]) 01/16/2013  . GLAUCOMA 06/01/2008  . Essential hypertension 06/01/2008  . Seasonal and perennial allergic rhinitis 06/01/2008    Arliss Journey, PT, DPT  03/10/2020, 9:16 AM  [facility] [address] [place], [place], [address] Phone: [phone]   Fax:  [phone]  Name: Bradey Southards MRN: [id number] Date of Birth: 29-Apr-1946

## 2020-03-09 NOTE — Therapy (Signed)
[facility] [address] [place], [place], [address] Phone: [phone]   Fax:  [phone]  Occupational Therapy Treatment  Patient Details  Name: [name] MRN: [id number] Date of Birth: 1946/06/11 Referring Provider (OT): Dr. Star Age   Encounter Date: 03/09/2020  OT End of Session - 03/09/20 1233    Visit Number  2    Number of Visits  9    Date for OT Re-Evaluation  04/17/20    Authorization Type  Medicare / BCBS, covered 100%    Authorization Time Period  --    Authorization - Visit Number  1    Authorization - Number of Visits  10    Progress Note Due on Visit  10    OT Start Time  1234    OT Stop Time  1315    OT Time Calculation (min)  41 min    Activity Tolerance  Patient tolerated treatment well    Behavior During Therapy  [facility] for tasks assessed/performed;Impulsive       Past Medical History:  Diagnosis Date  . Allergic rhinitis   . Allergy   . Arthritis   . At risk for falls   . Cancer ([place])    basal cell skin cancer  . Cataract   . Complication of anesthesia    Slow to awaken  . Constipation   . DDD (degenerative disc disease), cervical   . Dysphagia   . Edema extremities 01/13/2016  . GERD (gastroesophageal reflux disease)    Decdreased, has decreased coffee intack.  . Glaucoma   . HTN (hypertension)   . Hx of adenomatous colonic polyps 01/23/2016  . Hyperlipidemia   . Hypogonadism male   . Hypothyroidism   . OSA (obstructive sleep apnea)    Has had surgery x 2  . Parkinsonism ([place]) 01/16/2013   no tremors, balance issues only , speech and swallowing difficulties   . Pre-diabetes   . Sleep apnea    surgery x 2- no cpap  . Staph infection 10-2014  . Thyroid disease    HYPOTHYROID    Past Surgical History:  Procedure Laterality Date  . CATARACT EXTRACTION    . COLONOSCOPY  04-27-2004   tics, hems   . I & D EXTREMITY Right 10/18/2014   Procedure: IRRIGATION AND DEBRIDEMENT  RIGHT KNEE PREPATELLA BURSA INFECTION;  Surgeon: [name], MD;  Location: [facility];  Service: Orthopedics;  Laterality: Right;  . [place] SURGERY  04-2015  . LUMBAR LAMINECTOMY WITH COFLEX 1 LEVEL N/A 06/06/2015   Procedure: Lumbar three-four Laminectomy/Foraminotomy with placement of Coflex;  Surgeon: [name], MD;  Location: [facility];  Service: Neurosurgery;  Laterality: N/A;  . NASAL SEPTUM SURGERY    . TONSILLECTOMY     and adenoids  . UVULOPALATOPHARYNGOPLASTY      There were no vitals filed for this visit.  Subjective Assessment - 03/09/20 1232    Subjective   Pt reports low back pain.  Wife reports that pt did not do stretches this morning.    Patient is accompanied by:  Family member   [name]   Pertinent History  Progressive Supranuclear Palsy (PSP).PMH:  HTN, Glaucoma, hyperlipidemia, testosterone deficiency, lumbar stenosis, peripheral neuropathy, hypothyroidism, DDD, hx of multiple falls, dysphagia, GERD    Limitations  fall risk, cognitive deficits, impulsivity; visual deficits, swallowing deficits    Patient Stated Goals  writing/signing name, getting up from chair, grasping walker/holding utensil, washing/drying hands  Currently in Pain?  Yes    Pain Score  5     Pain Location  Back    Pain Orientation  Lower    Pain Descriptors / Indicators  Aching    Pain Type  Chronic pain    Pain Onset  More than a month ago    Pain Frequency  Intermittent    Aggravating Factors   not doing back    Pain Relieving Factors  stretching       Practiced cleaning hands with hand sanitizer x3 with mod cueing (verbal, demo, tactile) for incr movement amplitude.  Ambulating to sink and washing hands with mod cueing for incr movement amplitude (verbal, demo cues), CGA and min v.c. for ambulation, sit>stand, stand>sit, posture and min cues/reinforcement for grasp on walker.  Recommended that pt count movement, use large movement, good posture.  1 LOB at sink (therapist with  CGA using gait belt, but pt able to recover with countertop support quickly).     Sitting, functional reaching with each UE to grasp/release various sized bottles with min-mod cueing for full ext of fingers for grasp.  Set-up/min cues for reaching laterally and across body incorporating trunk rotation and lateral wt. Shift.    Practiced writing name with intermittent min-mod decr in size, trial of tan foam grip, and continuous "l" with visual target (tracing and self-generating) with min cueing (visual, verbal) for size and spacing.           OT Short Term Goals - 02/17/20 1544      OT SHORT TERM GOAL #1   Title  Pt will perform HEP with focus on large amplitude, functional movements.--check STGs 03/19/20    Time  4    Period  Weeks    Status  New      OT SHORT TERM GOAL #2   Title  Pt will wash/dry hands adequately with min cueing.    Time  4    Period  Weeks    Status  New      OT SHORT TERM GOAL #3   Title  Pt will be able to sign name with at least 50% legibility    Time  4    Period  Weeks    Status  New      OT SHORT TERM GOAL #4   Title  ---        OT Long Term Goals - 02/17/20 1546      OT LONG TERM GOAL #1   Title  Pt/caregiver will verbalize understanding of updated adaptive strategies/AE for ADLs prn.--check LTGs 04/18/20    Time  8    Period  Weeks    Status  New      OT LONG TERM GOAL #2   Title  Pt will demo improved grasp of RW with BUEs at least 75% of the time without cueing.    Time  8    Period  Weeks    Status  New      OT LONG TERM GOAL #3   Title  Pt will be able to sign name with at least 75% legibility.    Time  8    Period  Weeks    Status  New      OT LONG TERM GOAL #4   Title  Pt/wife with report improved ability to grasp spoon/fork when eating using AE/strategies prn.    Time  8    Period  Weeks    Status  New      OT LONG TERM GOAL #5   Title  ---            Plan - 03/09/20 1233    Clinical Impression Statement  Pt  demo improved movement amplitude with mod cueing and repetition.  Pt will benefit from significant repetition and cueing for incr carryover.    OT Occupational Profile and History  Detailed Assessment- Review of Records and additional review of physical, cognitive, psychosocial history related to current functional performance    Occupational performance deficits (Please refer to evaluation for details):  ADL's;IADL's;Leisure;Social Participation    Body Structure / Function / Physical Skills  ADL;Dexterity;Vision    Rehab Potential  Good    Clinical Decision Making  Several treatment options, min-mod task modification necessary    Comorbidities Affecting Occupational Performance:  May have comorbidities impacting occupational performance    Modification or Assistance to Complete Evaluation   Min-Moderate modification of tasks or assist with assess necessary to complete eval    OT Frequency  1x / week    OT Duration  8 weeks   +eval   OT Treatment/Interventions  Self-care/ADL training;DME and/or AE instruction;Balance training;Therapeutic activities;Aquatic Therapy;Cognitive remediation/compensation;Therapeutic exercise;Neuromuscular education;Functional Mobility Training;Passive range of motion;Visual/perceptual remediation/compensation;Patient/family education;Manual Therapy    Plan  continue with strategies for ADLs/caregiver education (grasp objects/walker, washing/drying hands), HEP with focus large amplitude movements for functional tasks    Consulted and Agree with Plan of Care  Patient;Family member/caregiver    Family Member Consulted  wife--[name]       Patient will benefit from skilled therapeutic intervention in order to improve the following deficits and impairments:   Body Structure / Function / Physical Skills: ADL, Dexterity, Vision       Visit Diagnosis: Other symptoms and signs involving the nervous system  Unsteadiness on feet  Abnormal posture  Attention and  concentration deficit  Frontal lobe and executive function deficit  Other abnormalities of gait and mobility    Problem List Patient Active Problem List   Diagnosis Date Noted  . Hypothyroidism 02/18/2018  . Peripheral neuropathy 11/14/2016  . PSP (progressive supranuclear palsy) ([place]) 11/08/2016  . Tubular adenoma of colon 01/23/2016  . Lumbar stenosis 06/06/2015  . HOCM (hypertrophic obstructive cardiomyopathy) ([place]) 12/12/2014  . Testosterone deficiency 11/12/2013  . Mixed hyperlipidemia 08/30/2013  . 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 Parkinsonism ([place]) 01/16/2013  . GLAUCOMA 06/01/2008  . Essential hypertension 06/01/2008  . Seasonal and perennial allergic rhinitis 06/01/2008    [facility] 03/09/2020, 3:25 PM  [facility] [address] [place], [place], [address] Phone: [phone]   Fax:  [phone]  Name: Anrew Buchko MRN: [id number] Date of Birth: [birth year]   Vianne Bulls, OTR/L [facility] [address] [place], [place]  [address] [phone] phone [phone] 03/09/20 3:25 PM

## 2020-03-14 ENCOUNTER — Other Ambulatory Visit: Payer: Self-pay

## 2020-03-14 ENCOUNTER — Ambulatory Visit: Payer: Medicare Other

## 2020-03-14 ENCOUNTER — Ambulatory Visit: Payer: Medicare Other | Admitting: Physical Therapy

## 2020-03-14 DIAGNOSIS — R2681 Unsteadiness on feet: Secondary | ICD-10-CM

## 2020-03-14 DIAGNOSIS — R29818 Other symptoms and signs involving the nervous system: Secondary | ICD-10-CM

## 2020-03-14 DIAGNOSIS — R2689 Other abnormalities of gait and mobility: Secondary | ICD-10-CM

## 2020-03-14 DIAGNOSIS — R471 Dysarthria and anarthria: Secondary | ICD-10-CM

## 2020-03-14 DIAGNOSIS — R41841 Cognitive communication deficit: Secondary | ICD-10-CM

## 2020-03-14 DIAGNOSIS — R1310 Dysphagia, unspecified: Secondary | ICD-10-CM

## 2020-03-14 NOTE — Patient Instructions (Signed)
   HELP ME  YES  NO  [facility]  READY WHEN YOU ARE  ROBO  BATHROOM  THE DOOR  WATER PLEASE  JANICE  STEPAHNIE  SUZANNE

## 2020-03-14 NOTE — Patient Instructions (Addendum)
May want to use a metronome or a metronome app on your phone to help slow the pace with the exercises.  We used 40-42 beats per minute today (one beat for up/one beat for down -for example-with the marching, leg kicks, ankle pumps)   For STEPS:  Going up the steps:  Holding cane in the left hand:  Go up using cane, Left leg, Right leg, then next step with cane, left leg, Right leg.  Repeat SLOWLY each step  Make sure to lean your head forward slightly when you are going up the steps to prevent leaning backwards too much.  SLOW DOWN and Listen to [name]'s cues!

## 2020-03-14 NOTE — Therapy (Signed)
[facility] [address] [place], [place], [address] Phone: [phone]   Fax:  [phone]  Speech Language Pathology Treatment  Patient Details  Name: [name] MRN: [id number] Date of Birth: 12-02-1945 Referring Provider (SLP): Star Age, MD   Encounter Date: 03/14/2020  End of Session - 03/14/20 1518    Visit Number  3    Number of Visits  9    Date for SLP Re-Evaluation  05/13/20    SLP Start Time  4081    SLP Stop Time   1400    SLP Time Calculation (min)  42 min    Activity Tolerance  Patient tolerated treatment well;Other (comment)       Past Medical History:  Diagnosis Date  . Allergic rhinitis   . Allergy   . Arthritis   . At risk for falls   . Cancer ([place])    basal cell skin cancer  . Cataract   . Complication of anesthesia    Slow to awaken  . Constipation   . DDD (degenerative disc disease), cervical   . Dysphagia   . Edema extremities 01/13/2016  . GERD (gastroesophageal reflux disease)    Decdreased, has decreased coffee intack.  . Glaucoma   . HTN (hypertension)   . Hx of adenomatous colonic polyps 01/23/2016  . Hyperlipidemia   . Hypogonadism male   . Hypothyroidism   . OSA (obstructive sleep apnea)    Has had surgery x 2  . Parkinsonism ([place]) 01/16/2013   no tremors, balance issues only , speech and swallowing difficulties   . Pre-diabetes   . Sleep apnea    surgery x 2- no cpap  . Staph infection 10-2014  . Thyroid disease    HYPOTHYROID    Past Surgical History:  Procedure Laterality Date  . CATARACT EXTRACTION    . COLONOSCOPY  04-27-2004   tics, hems   . I & D EXTREMITY Right 10/18/2014   Procedure: IRRIGATION AND DEBRIDEMENT RIGHT KNEE PREPATELLA BURSA INFECTION;  Surgeon: [name], MD;  Location: [facility];  Service: Orthopedics;  Laterality: Right;  . [place] SURGERY  04-2015  . LUMBAR LAMINECTOMY WITH COFLEX 1 LEVEL N/A 06/06/2015   Procedure: Lumbar three-four  Laminectomy/Foraminotomy with placement of Coflex;  Surgeon: [name], MD;  Location: [facility];  Service: Neurosurgery;  Laterality: N/A;  . NASAL SEPTUM SURGERY    . TONSILLECTOMY     and adenoids  . UVULOPALATOPHARYNGOPLASTY      There were no vitals filed for this visit.  Subjective Assessment - 03/14/20 1327    Subjective  (gurgly voice) SLP cued pt to swallow x2 to clear his voice.    Patient is accompained by:  --   [name] - wife   Currently in Pain?  No/denies            ADULT SLP TREATMENT - 03/14/20 1328      General Information   Behavior/Cognition  Alert;Pleasant mood;Impulsive      Treatment Provided   Treatment provided  Dysphagia      Dysphagia Treatment   Other treatment/comments  Reviewed swallow precautions with pt/wife that SLP went over with last time - pt wife stated that the handout is right next to where pt eats his food. Pt requires reminders even though paper with precautions on it is right there. SLp reviewed what a "gurgly" voice means today and told pt to clear throat and swallow - pt without success  with throat clear but reswallow was successful with a delay - pt had to consistently swallow x2 to have clear voice. At the end of session, pt req'd total cues as to what to do when he has a "wet" voice.      Cognitive-Linquistic Treatment   Treatment focused on  Dysarthria    Skilled Treatment  SLP reviewed pt's functional salient phrases as wife had longer sentences written on some cards and/or questions and pt's answers on the cards ("To give him some context when he would say them."). SLP educted pt/wife that phrases should be as short as possible and were for functional situations where we know pt would spontaneously speak or would respond. SLP collaborated with pt and wife to rework some cards (see "pt instructions"). SLP honed pt wife's cues from long verbose explanations to a suggested "shout" and SLP used this in the session today when pt  unintelligible and he incr'd loudness to functional 75% of the time. Pt also uses thumbs up for "yes", and flat shaking hand for "no" spontaneously at home but wife states there are some other signs pt uses that are hard to decipher as pt is not as distinct as he could be (turn right, turn left).       Assessment / Recommendations / Plan   Plan  Continue with current plan of care      Dysphagia Recommendations   Diet recommendations  Dysphagia 3 (mechanical soft);Nectar-thick liquid;Dysphagia 2 (fine chop);Dysphagia 1 (puree)    Liquids provided via  Straw;Cup    Medication Administration  Whole meds with puree    Supervision  Intermittent supervision to cue for compensatory strategies    Compensations  Slow rate;Small sips/bites;Multiple dry swallows after each bite/sip;Clear throat intermittently      Progression Toward Goals   Progression toward goals  Progressing toward goals       SLP Education - 03/14/20 1518    Education Details  rationale for shorter phrases, possibly use an alphabet board for expressive communication (moreso at night), education about "wet" voice and how to clear    Person(s) Educated  Patient;Spouse    Methods  Explanation    Comprehension  Verbalized understanding         SLP Long Term Goals - 03/14/20 1520      SLP LONG TERM GOAL #1   Title  pt will produce 80% of his chosen communicative phrases/sentences with functional intelligibility in 3 sessions    Time  7    Period  Weeks   or visits, for all LTGs   Status  On-going      SLP LONG TERM GOAL #2   Title  pt will indicate understanding of 4 s/s aspiration PNA    Time  7    Period  Weeks   or visits   Status  On-going      SLP LONG TERM GOAL #3   Title  to benefit pt's pulmonary health, wife will demo appropriate cueing for pt during POs over two sessions    Time  7    Period  Weeks   or visits   Status  On-going      SLP LONG TERM GOAL #4   Title  in 3 sessions, pt communicative  partner/wife will demo appropriate compensatory measures to assist in pt-listener communication    Time  7    Period  Weeks   or visits   Status  On-going  Plan - 03/14/20 1519    Clinical Impression Statement  Pt presents today with significant decline in speech intelligibility from previous course of therapy 3 years ago. Pt cont with cognitive linguistic deficits at least at same levels as noted in prior ST courses. Skilled speech therapy will cont to focus mainly on intelligibility of pt's functional language, and compensatory strategies for wife to use with pt to maximize his communication, and safety with POs.    Speech Therapy Frequency  1x /week   pt desired once/week as opposed to x2/week   Duration  --   8 weeks/9 total sessions   Treatment/Interventions  Aspiration precaution training;Diet toleration management by SLP;Cueing hierarchy;Patient/family education;SLP instruction and feedback;Internal/external aids;Compensatory techniques;Environmental controls;Pharyngeal strengthening exercises;Language facilitation;Cognitive reorganization;Functional tasks;Trials of upgraded texture/liquids;Multimodal communcation approach;Oral motor exercises    Potential to Achieve Goals  Fair    Potential Considerations  Severity of impairments;Previous level of function;Medical prognosis;Cooperation/participation level;Ability to learn/carryover information    Consulted and Agree with Plan of Care  Patient;Family member/caregiver    Family Member Consulted  [name]       Patient will benefit from skilled therapeutic intervention in order to improve the following deficits and impairments:   Dysarthria and anarthria  Dysphagia, unspecified type  Cognitive communication deficit    Problem List Patient Active Problem List   Diagnosis Date Noted  . Hypothyroidism 02/18/2018  . Peripheral neuropathy 11/14/2016  . PSP (progressive supranuclear palsy) ([place]) 11/08/2016  . Tubular  adenoma of colon 01/23/2016  . Lumbar stenosis 06/06/2015  . HOCM (hypertrophic obstructive cardiomyopathy) ([place]) 12/12/2014  . Testosterone deficiency 11/12/2013  . Mixed hyperlipidemia 08/30/2013  . 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 Parkinsonism ([place]) 01/16/2013  . GLAUCOMA 06/01/2008  . Essential hypertension 06/01/2008  . Seasonal and perennial allergic rhinitis 06/01/2008    [facility] ,MS, CCC-SLP  03/14/2020, 3:22 PM  [facility] [address] [place], [place], [address] Phone: [phone]   Fax:  [phone]   Name: [name] MRN: [id number] Date of Birth: 01-11-1946

## 2020-03-14 NOTE — Therapy (Signed)
[facility] [address] [place], [place], [address] Phone: [phone]   Fax:  [phone]  Physical Therapy Treatment  Patient Details  Name: [name] MRN: [id number] Date of Birth: [birth year] Referring Provider (PT): Star Age   Encounter Date: 03/14/2020  PT End of Session - 03/14/20 1651    Visit Number  5    Number of Visits  9    Date for PT Re-Evaluation  38/88/28   90 day cert for 9 week POC   Authorization Type  Medicare/BCBS    Progress Note Due on Visit  10    PT Start Time  1233    PT Stop Time  1315    PT Time Calculation (min)  42 min    Equipment Utilized During Treatment  Gait belt    Activity Tolerance  Patient tolerated treatment well;Other (comment)   impulsive at times   Behavior During Therapy  [facility] for tasks assessed/performed;Impulsive   impulsive at times with transfers      Past Medical History:  Diagnosis Date  . Allergic rhinitis   . Allergy   . Arthritis   . At risk for falls   . Cancer ([place])    basal cell skin cancer  . Cataract   . Complication of anesthesia    Slow to awaken  . Constipation   . DDD (degenerative disc disease), cervical   . Dysphagia   . Edema extremities 01/13/2016  . GERD (gastroesophageal reflux disease)    Decdreased, has decreased coffee intack.  . Glaucoma   . HTN (hypertension)   . Hx of adenomatous colonic polyps 01/23/2016  . Hyperlipidemia   . Hypogonadism male   . Hypothyroidism   . OSA (obstructive sleep apnea)    Has had surgery x 2  . Parkinsonism ([place]) 01/16/2013   no tremors, balance issues only , speech and swallowing difficulties   . Pre-diabetes   . Sleep apnea    surgery x 2- no cpap  . Staph infection 10-2014  . Thyroid disease    HYPOTHYROID    Past Surgical History:  Procedure Laterality Date  . CATARACT EXTRACTION    . COLONOSCOPY  04-27-2004   tics, hems   . I & D EXTREMITY Right 10/18/2014   Procedure: IRRIGATION  AND DEBRIDEMENT RIGHT KNEE PREPATELLA BURSA INFECTION;  Surgeon: [name], MD;  Location: [facility];  Service: Orthopedics;  Laterality: Right;  . [place] SURGERY  04-2015  . LUMBAR LAMINECTOMY WITH COFLEX 1 LEVEL N/A 06/06/2015   Procedure: Lumbar three-four Laminectomy/Foraminotomy with placement of Coflex;  Surgeon: [name], MD;  Location: [facility];  Service: Neurosurgery;  Laterality: N/A;  . NASAL SEPTUM SURGERY    . TONSILLECTOMY     and adenoids  . UVULOPALATOPHARYNGOPLASTY      There were no vitals filed for this visit.  Subjective Assessment - 03/14/20 1236    Subjective  Walked some with daughter over the weekend in the driveway; no falls.  Wife reports that his feet are getting so close together when he turns.    Patient is accompained by:  Family member   wife   Pertinent History  PSP, hx of falls    Patient Stated Goals  See above for wife's list of things to work on-pt agreeable to these goals    Currently in Pain?  No/denies  [facility] Adult PT Treatment/Exercise - 03/14/20 1239      Transfers   Transfers  Sit to Stand;Stand to Sit    Sit to Stand  4: Min guard;With upper extremity assist;From bed;From chair/3-in-1    Stand to Sit  4: Min guard;With upper extremity assist;To chair/3-in-1;To bed    Comments  PT maintains min guard throughout.  Pt performs 1st rep with decreased forward lean and pushing chair at backs of legs; remaining 4 reps, pt able to perform sit<>stand with correct technique.      Ambulation/Gait   Ambulation/Gait  Yes    Ambulation/Gait Assistance  4: Min guard;4: Min assist    Ambulation Distance (Feet)  110 Feet   x 2, then 100 ft x 2   Assistive device  Rolling walker    Gait Pattern  Step-through pattern;Decreased step length - right;Decreased step length - left;Decreased dorsiflexion - right;Right steppage    Ambulation Surface  Level;Indoor    Stairs  Yes    Stairs Assistance  4: Min assist    Initial mod assist due to posterior lean ascending steps   Stairs Assistance Details (indicate cue type and reason)  Pt goes up and down steps using B rails, then using R rail and cane in L hand, with step through pattern, decreased full foot placement on step, narrowed BOS and posterior lean upon ascending steps.  Practiced at least 5 reps with bilateral rails and at least 5 reps with R rail and LUE using cane:  pt attempts step-through pattern and increased speed; PT provides consistent verbal cues for L cane, L foot, R foot (step to pattern) ascending, with slight forward trunk lean to avoid strong posterior lean; cues for cane, RLE, then LLE for step to pattern descending steps.    Stair Management Technique  Two rails;One rail Right;Step to pattern   Cane LUE; pt attempts step-through pattern, cues for step-to   Number of Stairs  4    Height of Stairs  6    Gait Comments  Wife reports retropulsion with stairs; PT focused on stair training after HEP and goal check.      Standardized Balance Assessment   Standardized Balance Assessment  Timed Up and Go Test      Timed Up and Go Test   TUG  Normal TUG    Normal TUG (seconds)  29.41      Exercises   Exercises  Knee/Hip;Ankle    Other Exercises   Reviewed pt's HEP, with pt performing seated marching, LAQ, and ankle pumps at very rapid rate with significantly decreased amplitude of motion.  PT initially attempts verbal and visual cues to slow pace and increase amplitude of movement.  However, pt continues to speed up.  PT utilizes metronome, at 40-42 bpm, 2 sets x 5 reps each of LAQ, marching, ankle dorsiflexion, ankle plantarflexion, with pt able to maintain steady rhythm at slowed pace.      Throughout session with sit<>stand transfers, PT provides min guard/min assistance and consistent cues for safety.       PT Education - 03/14/20 1651    Education Details  use of metronome to slow pace of exercises; safe stair negotiation technique  (will need cues from wife, [name])    Person(s) Educated  Patient;Spouse    Methods  Explanation;Demonstration;Verbal cues;Handout    Comprehension  Verbalized understanding;Returned demonstration;Verbal cues required;Need further instruction       PT Short Term Goals - 03/14/20 1652  PT SHORT TERM GOAL #1   Title  Pt will perform HEP for strength, flexibility, balance and gait with wife's supervision.  TARGET 03/18/2020    Time  5    Period  Weeks    Status  Achieved      PT SHORT TERM GOAL #2   Title  Pt will perform at least 4 of 5 reps of sit<>stand, demonstrating proper sit<>stand technique with supervision, for improved functional strength and safety/efficiency with transfers.    Baseline  requires min guard assist, 4 of 5 trials correct technique    Time  5    Period  Weeks    Status  Partially Met      PT SHORT TERM GOAL #3   Title  Pt will improve TUG score to less than or equal to 30 seconds for decreased fall risk.    Baseline  36.6 sec at eval; 29.41 sec 03/14/2020    Time  5    Period  Weeks    Status  Achieved      PT SHORT TERM GOAL #4   Title  Pt/wife will report at least 25% improvement in bed mobility, for improved safety/decreased caregiver burden with bed moiblity.    Baseline  Reports 50% improvement in bed mobility 03/14/2020    Time  5    Period  Weeks    Status  Achieved        PT Long Term Goals - 02/15/20 1843      PT LONG TERM GOAL #1   Title  Pt/wife will verbalize understanding of fall prevention, including tips to reduce freezing.  TARGET 04/15/2020    Time  9    Period  Weeks    Status  New      PT LONG TERM GOAL #2   Title  Wife will demonstrate/verbalize appropriate cueing and assistance with pt and RW for safety with doorways and threshold negotiation.    Time  9    Period  Weeks    Status  New      PT LONG TERM GOAL #3   Title  Pt will improve gait velocity to at least 2.3 ft/sec for improved efficiency and safety with gait.     Baseline  2.1 ft at eval    Time  9    Period  Weeks    Status  New      PT LONG TERM GOAL #4   Title  Pt/wife will verbalize/demo understanding of safety with walker negotiation on outdoor surfaces and unlevel surfaces, at least 500 ft, for improved participation in community ambulation.    Time  9    Period  Weeks    Status  New            Plan - 03/14/20 1654    Clinical Impression Statement  Assessed STGs this session, with pt meeting 4 of 5 STGs.  He has partially met STG 2 for sit<>stand, needing cues and min guard assistance for correct technique.  Reviewed HEP this visit, with pt demo performance of seated leg exercises but at a rapid rate and decreased amplitude.  Utilized metronome at 40-42 bpm to help patient maintain slower speed and achieve larger overall movement patterns.  Remainder of session focused on stair negotiation training, as wife is concerned at patient's strong posterior lean.  Multiple reps wtih practice on step-through pattern, slight forward trunk lean and cues for slowed pace, with decreased posterior lean noted.  Pt will continue  to benefit from skilled PT to further address safety education and training for improved functional mobility and decreased falls.    Personal Factors and Comorbidities  Comorbidity 3+    Comorbidities  GERD, HTN DDD cervical spine, arthritis, OSA    Examination-Activity Limitations  Bed Mobility;Locomotion Level;Transfers;Stand    Examination-Participation Restrictions  Community Activity    Stability/Clinical Decision Making  Evolving/Moderate complexity    Rehab Potential  Good    PT Frequency  1x / week    PT Duration  Other (comment)   8 weeks, plus eval visit; total POC = 9 weeks   PT Treatment/Interventions  ADLs/Self Care Home Management;DME Instruction;Neuromuscular re-education;Balance training;Therapeutic exercise;Therapeutic activities;Functional mobility training;Stair training;Gait training;Patient/family education     PT Next Visit Plan  Review transfer training, stairs, work on bed mobility, gait outdoors. safety education with gait-doorways, thresholds, varied surfaces; add to HEP when appropriate for lower extremity strengthening, balance    PT Home Exercise Plan  [id number]    Consulted and Agree with Plan of Care  Patient;Family member/caregiver    Family Member Consulted  wife, [name]       Patient will benefit from skilled therapeutic intervention in order to improve the following deficits and impairments:  Abnormal gait, Difficulty walking, Decreased safety awareness, Decreased balance, Decreased mobility, Decreased strength, Postural dysfunction  Visit Diagnosis: Other symptoms and signs involving the nervous system  Unsteadiness on feet  Other abnormalities of gait and mobility     Problem List Patient Active Problem List   Diagnosis Date Noted  . Hypothyroidism 02/18/2018  . Peripheral neuropathy 11/14/2016  . PSP (progressive supranuclear palsy) ([place]) 11/08/2016  . Tubular adenoma of colon 01/23/2016  . Lumbar stenosis 06/06/2015  . HOCM (hypertrophic obstructive cardiomyopathy) ([place]) 12/12/2014  . Testosterone deficiency 11/12/2013  . Mixed hyperlipidemia 08/30/2013  . 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 Parkinsonism ([place]) 01/16/2013  . GLAUCOMA 06/01/2008  . Essential hypertension 06/01/2008  . Seasonal and perennial allergic rhinitis 06/01/2008    Khori Rosevear W. 03/14/2020, 4:59 PM  Frazier Butt., PT   [facility] [address] [place], [place], [address] Phone: [phone]   Fax:  [phone]  Name: [name] MRN: [id number] Date of Birth: 1946-08-17

## 2020-03-22 ENCOUNTER — Encounter: Payer: Self-pay | Admitting: Occupational Therapy

## 2020-03-22 ENCOUNTER — Ambulatory Visit: Payer: Medicare Other | Admitting: Physical Therapy

## 2020-03-22 ENCOUNTER — Ambulatory Visit: Payer: Medicare Other

## 2020-03-22 ENCOUNTER — Other Ambulatory Visit: Payer: Self-pay

## 2020-03-22 ENCOUNTER — Ambulatory Visit: Payer: Medicare Other | Admitting: Occupational Therapy

## 2020-03-22 DIAGNOSIS — R471 Dysarthria and anarthria: Secondary | ICD-10-CM

## 2020-03-22 DIAGNOSIS — R41844 Frontal lobe and executive function deficit: Secondary | ICD-10-CM

## 2020-03-22 DIAGNOSIS — R41841 Cognitive communication deficit: Secondary | ICD-10-CM

## 2020-03-22 DIAGNOSIS — R293 Abnormal posture: Secondary | ICD-10-CM

## 2020-03-22 DIAGNOSIS — R2689 Other abnormalities of gait and mobility: Secondary | ICD-10-CM

## 2020-03-22 DIAGNOSIS — R2681 Unsteadiness on feet: Secondary | ICD-10-CM

## 2020-03-22 DIAGNOSIS — R41842 Visuospatial deficit: Secondary | ICD-10-CM

## 2020-03-22 DIAGNOSIS — R41840 Attention and concentration deficit: Secondary | ICD-10-CM

## 2020-03-22 DIAGNOSIS — R29818 Other symptoms and signs involving the nervous system: Secondary | ICD-10-CM

## 2020-03-22 DIAGNOSIS — R1310 Dysphagia, unspecified: Secondary | ICD-10-CM

## 2020-03-22 NOTE — Therapy (Signed)
[facility] [address] [place], [place], [address] Phone: [phone]   Fax:  [phone]  Occupational Therapy Treatment  Patient Details  Name: [name] MRN: [id number] Date of Birth: [birth year] Referring Provider (OT): Dr. Star Age   Encounter Date: 03/22/2020   OT End of Session - 03/22/20 1233    Visit Number 3    Number of Visits 9    Date for OT Re-Evaluation 04/17/20    Authorization Type Medicare / BCBS, covered 100%    Authorization Time Period --    Authorization - Visit Number 3    Authorization - Number of Visits 10    Progress Note Due on Visit 10    OT Start Time 1233    OT Stop Time 1315    OT Time Calculation (min) 42 min    Activity Tolerance Patient tolerated treatment well    Behavior During Therapy [facility] for tasks assessed/performed;Impulsive           Past Medical History:  Diagnosis Date  . Allergic rhinitis   . Allergy   . Arthritis   . At risk for falls   . Cancer ([place])    basal cell skin cancer  . Cataract   . Complication of anesthesia    Slow to awaken  . Constipation   . DDD (degenerative disc disease), cervical   . Dysphagia   . Edema extremities 01/13/2016  . GERD (gastroesophageal reflux disease)    Decdreased, has decreased coffee intack.  . Glaucoma   . HTN (hypertension)   . Hx of adenomatous colonic polyps 01/23/2016  . Hyperlipidemia   . Hypogonadism male   . Hypothyroidism   . OSA (obstructive sleep apnea)    Has had surgery x 2  . Parkinsonism ([place]) 01/16/2013   no tremors, balance issues only , speech and swallowing difficulties   . Pre-diabetes   . Sleep apnea    surgery x 2- no cpap  . Staph infection 10-2014  . Thyroid disease    HYPOTHYROID    Past Surgical History:  Procedure Laterality Date  . CATARACT EXTRACTION    . COLONOSCOPY  04-27-2004   tics, hems   . I & D EXTREMITY Right 10/18/2014   Procedure: IRRIGATION AND DEBRIDEMENT RIGHT  KNEE PREPATELLA BURSA INFECTION;  Surgeon: [name], MD;  Location: [facility];  Service: Orthopedics;  Laterality: Right;  . [place] SURGERY  04-2015  . LUMBAR LAMINECTOMY WITH COFLEX 1 LEVEL N/A 06/06/2015   Procedure: Lumbar three-four Laminectomy/Foraminotomy with placement of Coflex;  Surgeon: [name], MD;  Location: [facility];  Service: Neurosurgery;  Laterality: N/A;  . NASAL SEPTUM SURGERY    . TONSILLECTOMY     and adenoids  . UVULOPALATOPHARYNGOPLASTY      There were no vitals filed for this visit.   Subjective Assessment - 03/22/20 1233    Subjective  Pt reports low back pain.  Wife reports that pt did not do stretches this morning.    Patient is accompanied by: Family member   wife [name]   Pertinent History Progressive Supranuclear Palsy (PSP).PMH:  HTN, Glaucoma, hyperlipidemia, testosterone deficiency, lumbar stenosis, peripheral neuropathy, hypothyroidism, DDD, hx of multiple falls, dysphagia, GERD    Limitations fall risk, cognitive deficits, impulsivity; visual deficits, swallowing deficits    Patient Stated Goals writing/signing name, getting up from chair, grasping walker/holding utensil, washing/drying hands    Currently in Pain? No/denies    Pain Onset More than  a month ago             Ambulating to sink and washing hands with mod cueing for incr movement amplitude (verbal, demo cues, tactile cues), CGA and min-mod v.c. for ambulation, sit>stand, stand>sit, posture and min cues/reinforcement for grasp on walker.  Recommended that pt count movement, use large movement, good posture.    Sitting, functional reaching with each UE to grasp/release various sized bottles with min-mod cueing for full ext of fingers for grasp.  Set-up/min cues for reaching laterally and across body incorporating trunk rotation and lateral wt. Shift.    Practiced writing name with intermittent mod decr in size, min cueing for stopping and stretching hands if size decreasing and for  more upright grasp on pen.  Then tracing pt's own signature for one that pt wrote BIG.  Simulated eating to scoop dried beans with spoon with min cueing for stopping between each bite and putting spoon down and opening hand to reduce eating speed and for grasp on spoon, min difficulty scooping from plate only at end.  Recommended trial of eating from plate just to incr difficulty and reduce speed with eating (due to impulsivity and risk of aspiration/choking).  Then stabbing pieces of bread with fork with min cueing to only get 1 piece at a time, grasp on fork to place in container and to stop between each rep to put down fork and stretch hand.       OT Short Term Goals - 02/17/20 1544      OT SHORT TERM GOAL #1   Title Pt will perform HEP with focus on large amplitude, functional movements.--check STGs 03/19/20    Time 4    Period Weeks    Status New      OT SHORT TERM GOAL #2   Title Pt will wash/dry hands adequately with min cueing.    Time 4    Period Weeks    Status New      OT SHORT TERM GOAL #3   Title Pt will be able to sign name with at least 50% legibility    Time 4    Period Weeks    Status New      OT SHORT TERM GOAL #4   Title ---             OT Long Term Goals - 02/17/20 1546      OT LONG TERM GOAL #1   Title Pt/caregiver will verbalize understanding of updated adaptive strategies/AE for ADLs prn.--check LTGs 04/18/20    Time 8    Period Weeks    Status New      OT LONG TERM GOAL #2   Title Pt will demo improved grasp of RW with BUEs at least 75% of the time without cueing.    Time 8    Period Weeks    Status New      OT LONG TERM GOAL #3   Title Pt will be able to sign name with at least 75% legibility.    Time 8    Period Weeks    Status New      OT LONG TERM GOAL #4   Title Pt/wife with report improved ability to grasp spoon/fork when eating using AE/strategies prn.    Time 8    Period Weeks    Status New      OT LONG TERM GOAL #5   Title  ---  Plan - 03/22/20 1539    Clinical Impression Statement Pt demo improved handwriting legibility and size for signature.  However, pt continues to need mod cueing (verbal and tactile) for large amplitude movements for washing hands.    Plan HEP with focus large amplitude movements for functional tasks (?grasp/release, PWR! hands, practice writing, large amplitude movement for rubbing hands to help with washing hands, etc); continue with strategies for ADLs/caregiver education (grasp objects/walker, washing/drying hands)    Consulted and Agree with Plan of Care Patient;Family member/caregiver    Family Member Consulted dtr           Patient will benefit from skilled therapeutic intervention in order to improve the following deficits and impairments:           Visit Diagnosis: Other symptoms and signs involving the nervous system  Unsteadiness on feet  Other abnormalities of gait and mobility  Abnormal posture  Attention and concentration deficit  Frontal lobe and executive function deficit  Visuospatial deficit    Problem List Patient Active Problem List   Diagnosis Date Noted  . Hypothyroidism 02/18/2018  . Peripheral neuropathy 11/14/2016  . PSP (progressive supranuclear palsy) ([place]) 11/08/2016  . Tubular adenoma of colon 01/23/2016  . Lumbar stenosis 06/06/2015  . HOCM (hypertrophic obstructive cardiomyopathy) ([place]) 12/12/2014  . Testosterone deficiency 11/12/2013  . Mixed hyperlipidemia 08/30/2013  . 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 Parkinsonism ([place]) 01/16/2013  . GLAUCOMA 06/01/2008  . Essential hypertension 06/01/2008  . Seasonal and perennial allergic rhinitis 06/01/2008    [facility] 03/22/2020, 3:42 PM  [place] [address] [place], [place], [address] Phone: [phone]   Fax:  [phone]  Name: [name] MRN: [id number] Date of Birth:  05-11-1946   Vianne Bulls, OTR/L [facility] [address] [place], [place]  [address] [phone] phone [phone] 03/22/20 3:42 PM

## 2020-03-23 NOTE — Therapy (Signed)
[facility] [address] [place], [place], [address] Phone: [phone]   Fax:  [phone]  Speech Language Pathology Treatment  Patient Details  Name: [name] MRN: [id number] Date of Birth: 1946/09/07 Referring Provider (SLP): Star Age, MD   Encounter Date: 03/22/2020   End of Session - 03/23/20 0824    Visit Number 4    Number of Visits 9    Date for SLP Re-Evaluation 05/13/20    SLP Start Time 6962    SLP Stop Time  1400    SLP Time Calculation (min) 41 min    Activity Tolerance Patient tolerated treatment well;Other (comment)           Past Medical History:  Diagnosis Date  . Allergic rhinitis   . Allergy   . Arthritis   . At risk for falls   . Cancer ([place])    basal cell skin cancer  . Cataract   . Complication of anesthesia    Slow to awaken  . Constipation   . DDD (degenerative disc disease), cervical   . Dysphagia   . Edema extremities 01/13/2016  . GERD (gastroesophageal reflux disease)    Decdreased, has decreased coffee intack.  . Glaucoma   . HTN (hypertension)   . Hx of adenomatous colonic polyps 01/23/2016  . Hyperlipidemia   . Hypogonadism male   . Hypothyroidism   . OSA (obstructive sleep apnea)    Has had surgery x 2  . Parkinsonism ([place]) 01/16/2013   no tremors, balance issues only , speech and swallowing difficulties   . Pre-diabetes   . Sleep apnea    surgery x 2- no cpap  . Staph infection 10-2014  . Thyroid disease    HYPOTHYROID    Past Surgical History:  Procedure Laterality Date  . CATARACT EXTRACTION    . COLONOSCOPY  04-27-2004   tics, hems   . I & D EXTREMITY Right 10/18/2014   Procedure: IRRIGATION AND DEBRIDEMENT RIGHT KNEE PREPATELLA BURSA INFECTION;  Surgeon: [name], MD;  Location: [facility];  Service: Orthopedics;  Laterality: Right;  . [place] SURGERY  04-2015  . LUMBAR LAMINECTOMY WITH COFLEX 1 LEVEL N/A 06/06/2015   Procedure: Lumbar  three-four Laminectomy/Foraminotomy with placement of Coflex;  Surgeon: [name], MD;  Location: [facility]UR[facility];  Service: Neurosurgery;  Laterality: N/A;  . NASAL SEPTUM SURGERY    . TONSILLECTOMY     and adenoids  . UVULOPALATOPHARYNGOPLASTY      There were no vitals filed for this visit.   Subjective Assessment - 03/23/20 0817    Subjective Entered with clear voice. Vinnie Level, daughter was accompanying pt today.    Currently in Pain? No/denies                 ADULT SLP TREATMENT - 03/23/20 0001      General Information   Behavior/Cognition Alert;Pleasant mood;Impulsive      Dysphagia Treatment   Other treatment/comments Pt with "Onion rings" after SLP explanation of communication strategies. SLP req'd cues from pt's dtr saying pt desired onion rings, which dtr states is on the "no" list as pt coughs with them. SLP reinforced rationale why pt would have difficulty withonion rings but offered for pt to have them here with SLP.       Cognitive-Linquistic Treatment   Treatment focused on Dysarthria    Skilled Treatment Pt daughter states that later in the day or in background noise might more challenging to  hear pt. SLP reinforced entire session pt's need to slow down speech and articulate one word at a time. Pt read sentences with, initially, consistent mod-max cues faded to occasional min-mod cues. SLP was modeling saying one word at a time 90% of the time during this task. SLP stressed importance of practice at home.      Assessment / Recommendations / Plan   Plan Continue with current plan of care      Dysphagia Recommendations   Diet recommendations Dysphagia 3 (mechanical soft)    Liquids provided via Straw;Cup    Medication Administration Whole meds with puree    Supervision Intermittent supervision to cue for compensatory strategies    Compensations Slow rate;Small sips/bites;Multiple dry swallows after each bite/sip;Clear throat intermittently      Progression  Toward Goals   Progression toward goals Progressing toward goals            SLP Education - 03/23/20 0822    Education Details why onion rings are not on dys III diet, need to practice at home, need to focus on articulating one word at a time    Person(s) Educated Patient;Child(ren)    Methods Explanation;Demonstration;Verbal cues    Comprehension Verbalized understanding;Verbal cues required;Need further instruction              SLP Long Term Goals - 03/23/20 0825      SLP LONG TERM GOAL #1   Title pt will produce 80% of his chosen communicative phrases/sentences with functional intelligibility in 3 sessions    Time 6    Period Weeks   or visits, for all LTGs   Status On-going      SLP LONG TERM GOAL #2   Title pt will indicate understanding of 4 s/s aspiration PNA    Time 6    Period Weeks   or visits   Status On-going      SLP LONG TERM GOAL #3   Title to benefit pt's pulmonary health, wife will demo appropriate cueing for pt during POs over two sessions    Time 6    Period Weeks   or visits   Status On-going      SLP LONG TERM GOAL #4   Title in 3 sessions, pt communicative partner/wife will demo appropriate compensatory measures to assist in pt-listener communication    Time 6    Period Weeks   or visits   Status On-going            Plan - 03/23/20 0825    Clinical Impression Statement Pt presents today with significant difficulty with speech intelligibility. Pt with questions today about diet, SLP addressed this. Pt cont with cognitive linguistic deficits at least at same levels as noted in prior ST courses. Skilled speech therapy will cont to focus mainly on intelligibility of pt's functional language, and compensatory strategies for wife to use with pt to maximize his communication, and safety with POs.    Speech Therapy Frequency 1x /week   pt desired once/week as opposed to x2/week   Duration --   8 weeks/9 total sessions   Treatment/Interventions  Aspiration precaution training;Diet toleration management by SLP;Cueing hierarchy;Patient/family education;SLP instruction and feedback;Internal/external aids;Compensatory techniques;Environmental controls;Pharyngeal strengthening exercises;Language facilitation;Cognitive reorganization;Functional tasks;Trials of upgraded texture/liquids;Multimodal communcation approach;Oral motor exercises    Potential to Achieve Goals Fair    Potential Considerations Severity of impairments;Previous level of function;Medical prognosis;Cooperation/participation level;Ability to learn/carryover information    Consulted and Agree with Plan of Care Patient;Family member/caregiver  Family Member Consulted [name]           Patient will benefit from skilled therapeutic intervention in order to improve the following deficits and impairments:   Dysarthria and anarthria  Cognitive communication deficit  Dysphagia, unspecified type    Problem List Patient Active Problem List   Diagnosis Date Noted  . Hypothyroidism 02/18/2018  . Peripheral neuropathy 11/14/2016  . PSP (progressive supranuclear palsy) ([place]) 11/08/2016  . Tubular adenoma of colon 01/23/2016  . Lumbar stenosis 06/06/2015  . HOCM (hypertrophic obstructive cardiomyopathy) ([place]) 12/12/2014  . Testosterone deficiency 11/12/2013  . Mixed hyperlipidemia 08/30/2013  . 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 Parkinsonism ([place]) 01/16/2013  . GLAUCOMA 06/01/2008  . Essential hypertension 06/01/2008  . Seasonal and perennial allergic rhinitis 06/01/2008    [facility] ,MS, CCC-SLP  03/23/2020, 8:26 AM  [facility] [address] [place], [place], [address] Phone: [phone]   Fax:  [phone]   Name: [name] MRN: [id number] Date of Birth: Jul 07, 1946

## 2020-03-23 NOTE — Therapy (Signed)
[facility] [address] [place], [place], [address] Phone: [phone]   Fax:  [phone]  Physical Therapy Treatment  Patient Details  Name: [name] MRN: [id number] Date of Birth: February 02, 1946 Referring Provider (PT): Star Age   Encounter Date: 03/22/2020   PT End of Session - 03/23/20 1457    Visit Number 6    Number of Visits 9    Date for PT Re-Evaluation 29/93/71   90 day cert for 9 week POC   Authorization Type Medicare/BCBS    Progress Note Due on Visit 10    PT Start Time 1104    PT Stop Time 1149    PT Time Calculation (min) 45 min    Equipment Utilized During Treatment Gait belt    Activity Tolerance Patient tolerated treatment well;Other (comment)   impulsive at times   Behavior During Therapy [facility] for tasks assessed/performed;Impulsive   impulsive at times with transfers          Past Medical History:  Diagnosis Date  . Allergic rhinitis   . Allergy   . Arthritis   . At risk for falls   . Cancer ([place])    basal cell skin cancer  . Cataract   . Complication of anesthesia    Slow to awaken  . Constipation   . DDD (degenerative disc disease), cervical   . Dysphagia   . Edema extremities 01/13/2016  . GERD (gastroesophageal reflux disease)    Decdreased, has decreased coffee intack.  . Glaucoma   . HTN (hypertension)   . Hx of adenomatous colonic polyps 01/23/2016  . Hyperlipidemia   . Hypogonadism male   . Hypothyroidism   . OSA (obstructive sleep apnea)    Has had surgery x 2  . Parkinsonism ([place]) 01/16/2013   no tremors, balance issues only , speech and swallowing difficulties   . Pre-diabetes   . Sleep apnea    surgery x 2- no cpap  . Staph infection 10-2014  . Thyroid disease    HYPOTHYROID    Past Surgical History:  Procedure Laterality Date  . CATARACT EXTRACTION    . COLONOSCOPY  04-27-2004   tics, hems   . I & D EXTREMITY Right 10/18/2014   Procedure: IRRIGATION AND  DEBRIDEMENT RIGHT KNEE PREPATELLA BURSA INFECTION;  Surgeon: [name], MD;  Location: [facility];  Service: Orthopedics;  Laterality: Right;  . [place] SURGERY  04-2015  . LUMBAR LAMINECTOMY WITH COFLEX 1 LEVEL N/A 06/06/2015   Procedure: Lumbar three-four Laminectomy/Foraminotomy with placement of Coflex;  Surgeon: [name], MD;  Location: [facility];  Service: Neurosurgery;  Laterality: N/A;  . NASAL SEPTUM SURGERY    . TONSILLECTOMY     and adenoids  . UVULOPALATOPHARYNGOPLASTY      There were no vitals filed for this visit.   Subjective Assessment - 03/22/20 1111    Subjective No falls; no additional walking outdoors.    Patient is accompained by: Family member   daughter   Pertinent History PSP, hx of falls    Patient Stated Goals See above for wife's list of things to work on-pt agreeable to these goals    Currently in Pain? No/denies                             [facility] Adult PT Treatment/Exercise - 03/22/20 1100      Transfers   Transfers Sit to Stand;Stand to  Sit    Sit to Stand 4: Min guard;With upper extremity assist;From bed;From chair/3-in-1    Stand to Sit 4: Min guard;With upper extremity assist;To chair/3-in-1;To bed    Number of Reps 2 sets;Other reps (comment)   5 reps     Ambulation/Gait   Ambulation/Gait Yes    Ambulation/Gait Assistance 4: Min guard;4: Min assist    Ambulation/Gait Assistance Details Additional 100 ft x 2 to and from bathroom    Ambulation Distance (Feet) 230 Feet   x 2; 2nd bout with turns, maneuvering around furniture   Assistive device Rolling walker    Gait Pattern Step-through pattern;Decreased step length - right;Decreased step length - left;Decreased dorsiflexion - right;Right steppage    Ambulation Surface Level;Indoor    Stairs Yes    Stairs Assistance 4: Min assist    Stairs Assistance Details (indicate cue type and reason) Practiced 3 reps, x 2, with use of R rail, cane in L hand, cues from PT (then from  daughter last 2 reps)-cane, step,step (encouraging step-to pattern), with cues to SLOW pace    Stair Management Technique One rail Right;Step to pattern;Forwards;With cane   Cues for cane step, step for SLOW pace   Number of Stairs 4    Height of Stairs 6    Pre-Gait Activities Worked on doorway negoitation with threshold into office, then simulated threshold (1 inch), with min guard and cues for safe management of doorways.    Gait Comments TUG shuttle, short distance gait mat>chair 10 ft away, working on widened BOS for gait and cues for deliberate marching turns for improved foot clearance, decreased festinating with turns.  Performed x 3 reps, requires cues each time.                  PT Education - 03/23/20 1456    Education Details Doorway threshold management; stair negoitation safety education with pt's daughter    Person(s) Educated Patient;Child(ren)   daughter   Methods Explanation;Demonstration;Verbal cues;Handout    Comprehension Verbalized understanding;Returned demonstration;Verbal cues required;Need further instruction            PT Short Term Goals - 03/14/20 1652      PT SHORT TERM GOAL #1   Title Pt will perform HEP for strength, flexibility, balance and gait with wife's supervision.  TARGET 03/18/2020    Time 5    Period Weeks    Status Achieved      PT SHORT TERM GOAL #2   Title Pt will perform at least 4 of 5 reps of sit<>stand, demonstrating proper sit<>stand technique with supervision, for improved functional strength and safety/efficiency with transfers.    Baseline requires min guard assist, 4 of 5 trials correct technique    Time 5    Period Weeks    Status Partially Met      PT SHORT TERM GOAL #3   Title Pt will improve TUG score to less than or equal to 30 seconds for decreased fall risk.    Baseline 36.6 sec at eval; 29.41 sec 03/14/2020    Time 5    Period Weeks    Status Achieved      PT SHORT TERM GOAL #4   Title Pt/wife will report at  least 25% improvement in bed mobility, for improved safety/decreased caregiver burden with bed moiblity.    Baseline Reports 50% improvement in bed mobility 03/14/2020    Time 5    Period Weeks    Status Achieved  PT Long Term Goals - 02/15/20 1843      PT LONG TERM GOAL #1   Title Pt/wife will verbalize understanding of fall prevention, including tips to reduce freezing.  TARGET 04/15/2020    Time 9    Period Weeks    Status New      PT LONG TERM GOAL #2   Title Wife will demonstrate/verbalize appropriate cueing and assistance with pt and RW for safety with doorways and threshold negotiation.    Time 9    Period Weeks    Status New      PT LONG TERM GOAL #3   Title Pt will improve gait velocity to at least 2.3 ft/sec for improved efficiency and safety with gait.    Baseline 2.1 ft at eval    Time 9    Period Weeks    Status New      PT LONG TERM GOAL #4   Title Pt/wife will verbalize/demo understanding of safety with walker negotiation on outdoor surfaces and unlevel surfaces, at least 500 ft, for improved participation in community ambulation.    Time 9    Period Weeks    Status New                 Plan - 03/23/20 1458    Clinical Impression Statement Continued skilled PT today focusing on gait safety and technique with short distance gait, turns, doorway negotiation, and stair negotiation.  Daughter present today and participates in education, providing appropriate cues for stairs during stair training in session.  Pt has had no reports of falls.  He will continue to benefit from skilled PT to address safety education and training with functional mobiltiy.    Personal Factors and Comorbidities Comorbidity 3+    Comorbidities GERD, HTN DDD cervical spine, arthritis, OSA    Examination-Activity Limitations Bed Mobility;Locomotion Level;Transfers;Stand    Examination-Participation Restrictions Community Activity    Stability/Clinical Decision Making  Evolving/Moderate complexity    Rehab Potential Good    PT Frequency 1x / week    PT Duration Other (comment)   8 weeks, plus eval visit; total POC = 9 weeks   PT Treatment/Interventions ADLs/Self Care Home Management;DME Instruction;Neuromuscular re-education;Balance training;Therapeutic exercise;Therapeutic activities;Functional mobility training;Stair training;Gait training;Patient/family education    PT Next Visit Plan Continue to work on transfer training, stairs, gait training. safety education with gait-doorways, thresholds, varied surfaces; add to HEP when appropriate for lower extremity strengthening, balance    PT Home Exercise Plan [id number]    Consulted and Agree with Plan of Care Patient;Family member/caregiver    Family Member Consulted daughter           Patient will benefit from skilled therapeutic intervention in order to improve the following deficits and impairments:  Abnormal gait, Difficulty walking, Decreased safety awareness, Decreased balance, Decreased mobility, Decreased strength, Postural dysfunction  Visit Diagnosis: Unsteadiness on feet  Other abnormalities of gait and mobility  Other symptoms and signs involving the nervous system     Problem List Patient Active Problem List   Diagnosis Date Noted  . Hypothyroidism 02/18/2018  . Peripheral neuropathy 11/14/2016  . PSP (progressive supranuclear palsy) ([place]) 11/08/2016  . Tubular adenoma of colon 01/23/2016  . Lumbar stenosis 06/06/2015  . HOCM (hypertrophic obstructive cardiomyopathy) ([employer]) 12/12/2014  . Testosterone deficiency 11/12/2013  . Mixed hyperlipidemia 08/30/2013  . 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 Parkinsonism ([place]) 01/16/2013  . GLAUCOMA 06/01/2008  . Essential hypertension 06/01/2008  . Seasonal and perennial allergic  rhinitis 06/01/2008    Alfie Rideaux W. 03/23/2020, 3:01 PM Frazier Butt., PT  [facility] [address] [place], [place], [address] Phone: [phone]   Fax:  [phone]  Name: [name] MRN: [id number] Date of Birth: 12-10-1945

## 2020-03-25 DIAGNOSIS — Z961 Presence of intraocular lens: Secondary | ICD-10-CM | POA: Diagnosis not present

## 2020-03-25 DIAGNOSIS — H16103 Unspecified superficial keratitis, bilateral: Secondary | ICD-10-CM | POA: Diagnosis not present

## 2020-03-25 DIAGNOSIS — H04123 Dry eye syndrome of bilateral lacrimal glands: Secondary | ICD-10-CM | POA: Diagnosis not present

## 2020-03-25 DIAGNOSIS — H401132 Primary open-angle glaucoma, bilateral, moderate stage: Secondary | ICD-10-CM | POA: Diagnosis not present

## 2020-03-28 ENCOUNTER — Ambulatory Visit (INDEPENDENT_AMBULATORY_CARE_PROVIDER_SITE_OTHER): Payer: Medicare Other | Admitting: Family Medicine

## 2020-03-28 ENCOUNTER — Other Ambulatory Visit: Payer: Self-pay

## 2020-03-28 VITALS — BP 120/68 | HR 55 | Temp 98.4°F | Resp 14 | Ht 70.0 in | Wt 182.0 lb

## 2020-03-28 DIAGNOSIS — I10 Essential (primary) hypertension: Secondary | ICD-10-CM | POA: Diagnosis not present

## 2020-03-28 DIAGNOSIS — G231 Progressive supranuclear ophthalmoplegia [Steele-Richardson-Olszewski]: Secondary | ICD-10-CM | POA: Diagnosis not present

## 2020-03-28 DIAGNOSIS — I421 Obstructive hypertrophic cardiomyopathy: Secondary | ICD-10-CM

## 2020-03-28 DIAGNOSIS — G20 Parkinson's disease: Secondary | ICD-10-CM

## 2020-03-28 DIAGNOSIS — E782 Mixed hyperlipidemia: Secondary | ICD-10-CM

## 2020-03-28 DIAGNOSIS — E039 Hypothyroidism, unspecified: Secondary | ICD-10-CM

## 2020-03-28 MED ORDER — ROSUVASTATIN CALCIUM 5 MG PO TABS
5.0000 mg | ORAL_TABLET | Freq: Every day | ORAL | 3 refills | Status: DC
Start: 2020-03-28 — End: 2021-03-17

## 2020-03-28 NOTE — Patient Instructions (Signed)
Please return in 6 months for your annual complete physical; please come fasting.  Try starting the low dose crestor for vascular disease prevention once a night. If you have any problems with it, let me know.   If you have any questions or concerns, please don't hesitate to send me a message via MyChart or call the office at [phone]. Thank you for visiting with [place] today! It's our pleasure caring for you.

## 2020-03-28 NOTE — Progress Notes (Signed)
Subjective  CC:  Chief Complaint  Patient presents with   Hypertension    HPI: [name] is a 74 y.o. male who presents to the office today to address the problems listed above in the chief complaint.  Hypertension f/u: Control is good . Pt reports he is doing well. taking medications as instructed, no medication side effects noted, no TIAs, no chest pain on exertion, no dyspnea on exertion, no swelling of ankles. No falls. Reviewed cardiology notes He denies adverse effects from his BP medications. Compliance with medication is good.   HLD: hasn't been on statin. Elevated ascvd score. Diet is fatty. No CAD.   HOCM per cards, stable.  Low thyroid: last tsh was at goal w/o new sxs  Parkinson's and PSP: dysphagia, neuropathy, poor balance and high fall risk.   HM: up to date.   Assessment  1. Essential hypertension   2. PSP (progressive supranuclear palsy) ([place])   3. HOCM (hypertrophic obstructive cardiomyopathy) ([place])   4. Acquired hypothyroidism   5. Mixed hyperlipidemia   6. Parkinson's disease ([place])      Plan    Hypertension f/u: BP control is well controlled. No change in meds  Hyperlipidemia f/u: counseled on risks/benefits of statin use. Will try low dose crestor.   Low thyroid is controlled.   Neuro managing PSP/parkinson's. Wife is primary care taker.  Education regarding management of these chronic disease states was given. Management strategies discussed on successive visits include dietary and exercise recommendations, goals of achieving and maintaining IBW, and lifestyle modifications aiming for adequate sleep and minimizing stressors.   Follow up: 6 months for cpe  No orders of the defined types were placed in this encounter.  Meds ordered this encounter  Medications   rosuvastatin (CRESTOR) 5 MG tablet    Sig: Take 1 tablet (5 mg total) by mouth daily.    Dispense:  90 tablet    Refill:  3      BP Readings from Last 3 Encounters:    03/28/20 120/68  01/20/20 122/69  11/30/19 134/76   Wt Readings from Last 3 Encounters:  03/28/20 182 lb (82.6 kg)  11/30/19 179 lb (81.2 kg)  09/25/19 182 lb 12.8 oz (82.9 kg)    Lab Results  Component Value Date   CHOL 158 09/25/2019   CHOL 174 02/18/2018   CHOL 185 09/30/2015   Lab Results  Component Value Date   HDL 38 (L) 09/25/2019   HDL 37.80 (L) 02/18/2018   HDL 38.90 (L) 09/30/2015   Lab Results  Component Value Date   LDLCALC 96 09/25/2019   LDLCALC 108 (H) 09/30/2015   LDLCALC 124 (H) 09/28/2014   Lab Results  Component Value Date   TRIG 139 09/25/2019   TRIG 235.0 (H) 02/18/2018   TRIG 191.0 (H) 09/30/2015   Lab Results  Component Value Date   CHOLHDL 4.2 09/25/2019   CHOLHDL 5 02/18/2018   CHOLHDL 5 09/30/2015   Lab Results  Component Value Date   LDLDIRECT 107.0 02/18/2018   Lab Results  Component Value Date   CREATININE 0.73 01/19/2020   BUN 6 (L) 01/19/2020   NA 141 01/19/2020   K 4.9 01/19/2020   CL 105 01/19/2020   CO2 28 01/19/2020    The 10-year ASCVD risk score Mikey Bussing DC Jr., et al., 2013) is: 39.6%   Values used to calculate the score:     Age: 89 years     Sex: Male     Is  Non-Hispanic African American: No     Diabetic: Yes     Tobacco smoker: No     Systolic Blood Pressure: 846 mmHg     Is BP treated: Yes     HDL Cholesterol: 38 mg/dL     Total Cholesterol: 158 mg/dL  I reviewed the patients updated PMH, FH, and SocHx.    Patient Active Problem List   Diagnosis Date Noted   Hypothyroidism 02/18/2018   Peripheral neuropathy 11/14/2016   PSP (progressive supranuclear palsy) ([place]) 11/08/2016   Tubular adenoma of colon 01/23/2016   Lumbar stenosis 06/06/2015   HOCM (hypertrophic obstructive cardiomyopathy) ([place]) 12/12/2014   Testosterone deficiency 11/12/2013   Mixed hyperlipidemia 08/30/2013   Impotence of organic origin 08/30/2013   Parkinsonism ([place]) 01/16/2013   GLAUCOMA 06/01/2008   Essential  hypertension 06/01/2008   Seasonal and perennial allergic rhinitis 06/01/2008    Allergies: Ambien [zolpidem tartrate], Atenolol, Lunesta [eszopiclone], Oxazepam, Requip [ropinirole hcl], and Ziac [bisoprolol-hydrochlorothiazide]  Social History: Patient  reports that he has never smoked. He has never used smokeless tobacco. He reports that he does not drink alcohol and does not use drugs.  Current Meds  Medication Sig   aspirin 81 MG tablet Take 81 mg by mouth daily.   Cholecalciferol (VITAMIN D) 2000 units CAPS Take 1 capsule by mouth once a week.    fexofenadine (ALLEGRA) 60 MG tablet Take 60 mg by mouth daily.    finasteride (PROSCAR) 5 MG tablet Take 5 mg by mouth daily.   fluticasone (FLONASE) 50 MCG/ACT nasal spray SPRAY 1 SPRAY INTO EACH NOSTRIL EVERY DAY   levothyroxine (SYNTHROID) 50 MCG tablet TAKE 1 TABLET BY MOUTH EVERY DAY   losartan (COZAAR) 100 MG tablet TAKE 1 TABLET BY MOUTH EVERY DAY   Multiple Vitamins-Minerals (MULTIVITAMIN WITH MINERALS) tablet Take 1 tablet by mouth daily.     Naproxen Sodium (ALEVE) 220 MG CAPS Take by mouth.   polyethylene glycol (MIRALAX / GLYCOLAX) 17 g packet Take 17 g by mouth daily.   timolol (BETIMOL) 0.5 % ophthalmic solution Place 1 drop into both eyes 2 (two) times daily.     Review of Systems: Cardiovascular: negative for chest pain, palpitations, leg swelling, orthopnea Respiratory: negative for SOB, wheezing or persistent cough Gastrointestinal: negative for abdominal pain Genitourinary: negative for dysuria or gross hematuria  Objective  Vitals: BP 120/68    Pulse (!) 55    Temp 98.4 F (36.9 C) (Temporal)    Resp 14    Ht 5\' 10"  (1.778 m)    Wt 182 lb (82.6 kg)    SpO2 96%    BMI 26.11 kg/m  General: no acute distress  Psych:  Alert and oriented, HEENT:  Normocephalic, atraumatic, supple neck  Cardiovascular:  RRR without murmur. no edema Respiratory:  Good breath sounds bilaterally, CTAB with normal respiratory  effort Skin:  Warm, no rashes   Commons side effects, risks, benefits, and alternatives for medications and treatment plan prescribed today were discussed, and the patient expressed understanding of the given instructions. Patient is instructed to call or message via MyChart if he/she has any questions or concerns regarding our treatment plan. No barriers to understanding were identified. We discussed Red Flag symptoms and signs in detail. Patient expressed understanding regarding what to do in case of urgent or emergency type symptoms.   Medication list was reconciled, printed and provided to the patient in AVS. Patient instructions and summary information was reviewed with the patient as documented in the AVS.  This note was prepared with assistance of Systems analyst. Occasional wrong-word or sound-a-like substitutions may have occurred due to the inherent limitations of voice recognition software  This visit occurred during the SARS-CoV-2 public health emergency.  Safety protocols were in place, including screening questions prior to the visit, additional usage of staff PPE, and extensive cleaning of exam room while observing appropriate contact time as indicated for disinfecting solutions.

## 2020-03-30 ENCOUNTER — Ambulatory Visit: Payer: Medicare Other | Admitting: Physical Therapy

## 2020-03-30 ENCOUNTER — Ambulatory Visit: Payer: Medicare Other | Admitting: Occupational Therapy

## 2020-03-30 ENCOUNTER — Other Ambulatory Visit: Payer: Self-pay

## 2020-03-30 DIAGNOSIS — R2689 Other abnormalities of gait and mobility: Secondary | ICD-10-CM

## 2020-03-30 DIAGNOSIS — R41844 Frontal lobe and executive function deficit: Secondary | ICD-10-CM

## 2020-03-30 DIAGNOSIS — R2681 Unsteadiness on feet: Secondary | ICD-10-CM | POA: Diagnosis not present

## 2020-03-30 DIAGNOSIS — R29818 Other symptoms and signs involving the nervous system: Secondary | ICD-10-CM

## 2020-03-30 DIAGNOSIS — R41840 Attention and concentration deficit: Secondary | ICD-10-CM

## 2020-03-30 DIAGNOSIS — R293 Abnormal posture: Secondary | ICD-10-CM

## 2020-03-30 DIAGNOSIS — R41842 Visuospatial deficit: Secondary | ICD-10-CM

## 2020-03-30 NOTE — Patient Instructions (Signed)
When washing your hands open fingers wide and rub hands together with big motions, slow down, do not get small  Reach for plastic bottles or containers to place in a basket or box, open fingers wide.  Flip playing cards with big motions reach to stretch your arm out.  When eating, pause in between each bite, let spoon rest on the side of your plate or bowl.  When blowing your nose slow down, take a deep breath, blow out over a count of 4 forcefully.

## 2020-03-30 NOTE — Therapy (Signed)
[facility] [address] [place], [place], [address] Phone: [phone]   Fax:  [phone]  Physical Therapy Treatment  Patient Details  Name: [name] MRN: [id number] Date of Birth: 1946-03-19 Referring Provider (PT): Star Age   Encounter Date: 03/30/2020   PT End of Session - 03/30/20 1317    Visit Number 7    Number of Visits 9    Date for PT Re-Evaluation 85/90/93   90 day cert for 9 week POC   Authorization Type Medicare/BCBS    Progress Note Due on Visit 10    PT Start Time 1231   7 minutes non billable due to pt using restroom   PT Stop Time 1315    PT Time Calculation (min) 44 min    Equipment Utilized During Treatment Gait belt    Activity Tolerance Patient tolerated treatment well;Other (comment)   impulsive at times   Behavior During Therapy [facility] for tasks assessed/performed;Impulsive   impulsive at times with transfers          Past Medical History:  Diagnosis Date  . Allergic rhinitis   . Allergy   . Arthritis   . At risk for falls   . Cancer ([place])    basal cell skin cancer  . Cataract   . Complication of anesthesia    Slow to awaken  . Constipation   . DDD (degenerative disc disease), cervical   . Dysphagia   . Edema extremities 01/13/2016  . GERD (gastroesophageal reflux disease)    Decdreased, has decreased coffee intack.  . Glaucoma   . HTN (hypertension)   . Hx of adenomatous colonic polyps 01/23/2016  . Hyperlipidemia   . Hypogonadism male   . Hypothyroidism   . OSA (obstructive sleep apnea)    Has had surgery x 2  . Parkinsonism ([place]) 01/16/2013   no tremors, balance issues only , speech and swallowing difficulties   . Pre-diabetes   . Sleep apnea    surgery x 2- no cpap  . Staph infection 10-2014  . Thyroid disease    HYPOTHYROID    Past Surgical History:  Procedure Laterality Date  . CATARACT EXTRACTION    . COLONOSCOPY  04-27-2004   tics, hems   . I & D  EXTREMITY Right 10/18/2014   Procedure: IRRIGATION AND DEBRIDEMENT RIGHT KNEE PREPATELLA BURSA INFECTION;  Surgeon: [name], MD;  Location: [facility];  Service: Orthopedics;  Laterality: Right;  . [place] SURGERY  04-2015  . LUMBAR LAMINECTOMY WITH COFLEX 1 LEVEL N/A 06/06/2015   Procedure: Lumbar three-four Laminectomy/Foraminotomy with placement of Coflex;  Surgeon: [name], MD;  Location: [facility];  Service: Neurosurgery;  Laterality: N/A;  . NASAL SEPTUM SURGERY    . TONSILLECTOMY     and adenoids  . UVULOPALATOPHARYNGOPLASTY      There were no vitals filed for this visit.   Subjective Assessment - 03/30/20 1234    Subjective No falls. Has been doing the exercises at home, but not with using a metronome. Wants to work on stairs today.    Patient is accompained by: Family member   daughter   Pertinent History PSP, hx of falls    Patient Stated Goals See above for wife's list of things to work on-pt agreeable to these goals    Currently in Pain? No/denies  [facility] Adult PT Treatment/Exercise - 03/30/20 1253      Transfers   Transfers Sit to Stand;Stand to Sit    Sit to Stand 4: Min guard;With upper extremity assist;From bed;From chair/3-in-1    Stand to Sit 4: Min guard;With upper extremity assist;To chair/3-in-1;To bed    Number of Reps 2 sets   5 reps from mat table   Transfer Cueing TUG shuttle: short distance gait from mat > standard height arm chair 10 ft away with focus on sit <> stand technique, wide BOS during gait and when performing turns. Performed 3 reps - verbal cues provided throughout     Comments multiple reps performed throughout session with verbal and demo cues to scoot towards edge, get feet under him, tall posture and "nose over toes" for incr forward weight shift, pt initially performing more impulsively and needing cues to slow down.      Ambulation/Gait   Ambulation/Gait Yes    Ambulation/Gait  Assistance 4: Min guard;4: Min assist    Ambulation/Gait Assistance Details 100' x2 to and from bathroom, plus additional clinic distances    Ambulation Distance (Feet) 115 Feet    Assistive device Rolling walker    Gait Pattern Step-through pattern;Decreased step length - right;Decreased step length - left;Decreased dorsiflexion - right;Right steppage    Ambulation Surface Level;Indoor    Stairs Yes    Stairs Assistance 4: Min assist;3: Mod assist    Stairs Assistance Details (indicate cue type and reason) Trialed first rep with use of SPC with quad prong tip (due to wife's request to see if pt will have incr stability), during 1st rep pt demonstrating incr retropulsion and needing mod A for balance when ascending, needing verbal cues for SLOWED pace with cane, step, step (R rail and cane in L hand). Pt reporting feeling less steady with SPC with quad tip and would prefer with regular SPC. Performed an additional 2 reps with SPC with pt needing min A for ascending and descending. During last rep, pt needing cues for proper LUE placement when descending as pt went to reach for RW before descending last step. RW placed right at bottom of step for pt to transition from [facility] to RW with min A. Took a seated rest break in nearby chair between each rep.     Stair Management Technique One rail Right;Step to pattern;Forwards;With cane   cues for cane, step, step for SLOWED pace   Number of Stairs 4   x3 reps   Height of Stairs 6      Exercises   Exercises Other Exercises    Other Exercises  PT utilizes metronome, at 40-42 bpm, 2 sets x 10 reps: LAQ, marching, x10 reps each of ankle dorsiflexion, ankle plantarflexion, with pt able to maintain steady rhythm at slowed pace. Therapist performing next to pt for maintaining rhythm. Educated pt's spouse on bpm and use of free metronome apps on the phone in order to perform with pt at home for more slowed, deliberate movement.                  PT Education  - 03/30/20 1255    Education Details reviewed safe stair negotiation technique (with [name] present)    Person(s) Educated Patient;Spouse    Methods Explanation;Demonstration;Verbal cues    Comprehension Verbalized understanding;Verbal cues required;Need further instruction            PT Short Term Goals - 03/14/20 1652      PT SHORT TERM  GOAL #1   Title Pt will perform HEP for strength, flexibility, balance and gait with wife's supervision.  TARGET 03/18/2020    Time 5    Period Weeks    Status Achieved      PT SHORT TERM GOAL #2   Title Pt will perform at least 4 of 5 reps of sit<>stand, demonstrating proper sit<>stand technique with supervision, for improved functional strength and safety/efficiency with transfers.    Baseline requires min guard assist, 4 of 5 trials correct technique    Time 5    Period Weeks    Status Partially Met      PT SHORT TERM GOAL #3   Title Pt will improve TUG score to less than or equal to 30 seconds for decreased fall risk.    Baseline 36.6 sec at eval; 29.41 sec 03/14/2020    Time 5    Period Weeks    Status Achieved      PT SHORT TERM GOAL #4   Title Pt/wife will report at least 25% improvement in bed mobility, for improved safety/decreased caregiver burden with bed moiblity.    Baseline Reports 50% improvement in bed mobility 03/14/2020    Time 5    Period Weeks    Status Achieved             PT Long Term Goals - 02/15/20 1843      PT LONG TERM GOAL #1   Title Pt/wife will verbalize understanding of fall prevention, including tips to reduce freezing.  TARGET 04/15/2020    Time 9    Period Weeks    Status New      PT LONG TERM GOAL #2   Title Wife will demonstrate/verbalize appropriate cueing and assistance with pt and RW for safety with doorways and threshold negotiation.    Time 9    Period Weeks    Status New      PT LONG TERM GOAL #3   Title Pt will improve gait velocity to at least 2.3 ft/sec for improved efficiency and  safety with gait.    Baseline 2.1 ft at eval    Time 9    Period Weeks    Status New      PT LONG TERM GOAL #4   Title Pt/wife will verbalize/demo understanding of safety with walker negotiation on outdoor surfaces and unlevel surfaces, at least 500 ft, for improved participation in community ambulation.    Time 9    Period Weeks    Status New                 Plan - 03/30/20 1321    Clinical Impression Statement Today's skilled session continued to focus on gait safety, transfer training, turns, and stair negotiation with use of railing and SPC. Wife present throughout session today. Pt needing cues to slow down when ascending stairs and for cane, step, step pattern, and cues for forward weight shift. Pt initially performing 1st rep of stairs with incr retropulsion and needed mod A. With incr reps and verbal cues, pt able to perform turns with a wider BOS. Will continue to progress towards LTGs.    Personal Factors and Comorbidities Comorbidity 3+    Comorbidities GERD, HTN DDD cervical spine, arthritis, OSA    Examination-Activity Limitations Bed Mobility;Locomotion Level;Transfers;Stand    Examination-Participation Restrictions Community Activity    Stability/Clinical Decision Making Evolving/Moderate complexity    Rehab Potential Good    PT Frequency 1x / week  PT Duration Other (comment)   8 weeks, plus eval visit; total POC = 9 weeks   PT Treatment/Interventions ADLs/Self Care Home Management;DME Instruction;Neuromuscular re-education;Balance training;Therapeutic exercise;Therapeutic activities;Functional mobility training;Stair training;Gait training;Patient/family education    PT Next Visit Plan Continue to work on transfer training, stairs, gait training. safety education with gait-doorways, thresholds, varied surfaces; add to HEP when appropriate for lower extremity strengthening, balance    PT Home Exercise Plan [email or web address]    Consulted and Agree with Plan of Care  Patient;Family member/caregiver    Family Member Consulted pt's spouse           Patient will benefit from skilled therapeutic intervention in order to improve the following deficits and impairments:  Abnormal gait, Difficulty walking, Decreased safety awareness, Decreased balance, Decreased mobility, Decreased strength, Postural dysfunction  Visit Diagnosis: Other symptoms and signs involving the nervous system  Unsteadiness on feet  Other abnormalities of gait and mobility  Abnormal posture     Problem List Patient Active Problem List   Diagnosis Date Noted  . Hypothyroidism 02/18/2018  . Peripheral neuropathy 11/14/2016  . PSP (progressive supranuclear palsy) ([place]) 11/08/2016  . Tubular adenoma of colon 01/23/2016  . Lumbar stenosis 06/06/2015  . HOCM (hypertrophic obstructive cardiomyopathy) ([place]) 12/12/2014  . Testosterone deficiency 11/12/2013  . Mixed hyperlipidemia 08/30/2013  . 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 Parkinsonism ([place]) 01/16/2013  . GLAUCOMA 06/01/2008  . Essential hypertension 06/01/2008  . Seasonal and perennial allergic rhinitis 06/01/2008    Arliss Journey, PT, DPT  03/30/2020, 1:28 PM  [facility] [address] [place], [place], [address] Phone: [phone]   Fax:  [phone]  Name: [name] MRN: [id number] Date of Birth: [birth year]

## 2020-03-30 NOTE — Therapy (Signed)
[facility] [address] [place], [place], [address] Phone: [phone]   Fax:  [phone]  Occupational Therapy Treatment  Patient Details  Name: [name] MRN: [id number] Date of Birth: July 26, 1946 Referring Provider (OT): Dr. Star Age   Encounter Date: 03/30/2020   OT End of Session - 03/30/20 1643    Visit Number 4    Number of Visits 9    Date for OT Re-Evaluation 04/17/20    Authorization Type Medicare / BCBS, covered 100%    Authorization Time Period --    Authorization - Visit Number 4    Authorization - Number of Visits 10    Progress Note Due on Visit 10    OT Start Time 1150    OT Stop Time 1230    OT Time Calculation (min) 40 min    Activity Tolerance Patient tolerated treatment well    Behavior During Therapy [facility] for tasks assessed/performed;Impulsive           Past Medical History:  Diagnosis Date  . Allergic rhinitis   . Allergy   . Arthritis   . At risk for falls   . Cancer ([place])    basal cell skin cancer  . Cataract   . Complication of anesthesia    Slow to awaken  . Constipation   . DDD (degenerative disc disease), cervical   . Dysphagia   . Edema extremities 01/13/2016  . GERD (gastroesophageal reflux disease)    Decdreased, has decreased coffee intack.  . Glaucoma   . HTN (hypertension)   . Hx of adenomatous colonic polyps 01/23/2016  . Hyperlipidemia   . Hypogonadism male   . Hypothyroidism   . OSA (obstructive sleep apnea)    Has had surgery x 2  . Parkinsonism ([place]) 01/16/2013   no tremors, balance issues only , speech and swallowing difficulties   . Pre-diabetes   . Sleep apnea    surgery x 2- no cpap  . Staph infection 10-2014  . Thyroid disease    HYPOTHYROID    Past Surgical History:  Procedure Laterality Date  . CATARACT EXTRACTION    . COLONOSCOPY  04-27-2004   tics, hems   . I & D EXTREMITY Right 10/18/2014   Procedure: IRRIGATION AND DEBRIDEMENT RIGHT  KNEE PREPATELLA BURSA INFECTION;  Surgeon: [name], MD;  Location: [facility];  Service: Orthopedics;  Laterality: Right;  . [place] SURGERY  04-2015  . LUMBAR LAMINECTOMY WITH COFLEX 1 LEVEL N/A 06/06/2015   Procedure: Lumbar three-four Laminectomy/Foraminotomy with placement of Coflex;  Surgeon: [name], MD;  Location: [facility];  Service: Neurosurgery;  Laterality: N/A;  . NASAL SEPTUM SURGERY    . TONSILLECTOMY     and adenoids  . UVULOPALATOPHARYNGOPLASTY      There were no vitals filed for this visit.   Subjective Assessment - 03/30/20 1659    Subjective  Pt requests assit with writing and blowing his nose    Patient is accompanied by: Family member   [name]   Pertinent History Progressive Supranuclear Palsy (PSP).PMH:  HTN, Glaucoma, hyperlipidemia, testosterone deficiency, lumbar stenosis, peripheral neuropathy, hypothyroidism, DDD, hx of multiple falls, dysphagia, GERD    Limitations fall risk, cognitive deficits, impulsivity; visual deficits, swallowing deficits    Patient Stated Goals writing/signing name, getting up from chair, grasping walker/holding utensil, washing/drying hands    Currently in Pain? No/denies  Treatment: Pt practiced writing and printing, mod v.c for larger letter size, however pt demo increased legibility and letter size. Strategies for blowing nose and washing hands/ applying hand gel with big movements, mod v.c to slow down and move big. Functional HEP issued see pt instructions.             OT Education - 03/30/20 1656    Education Details HEP of functional activities to perfrom at home see pt instructions.    Person(s) Educated Patient;Spouse    Methods Explanation;Verbal cues;Handout    Comprehension Verbalized understanding;Returned demonstration;Verbal cues required;Tactile cues required            OT Short Term Goals - 02/17/20 1544      OT SHORT TERM GOAL #1   Title Pt will perform  HEP with focus on large amplitude, functional movements.--check STGs 03/19/20    Time 4    Period Weeks    Status New      OT SHORT TERM GOAL #2   Title Pt will wash/dry hands adequately with min cueing.    Time 4    Period Weeks    Status New      OT SHORT TERM GOAL #3   Title Pt will be able to sign name with at least 50% legibility    Time 4    Period Weeks    Status New      OT SHORT TERM GOAL #4   Title ---             OT Long Term Goals - 02/17/20 1546      OT LONG TERM GOAL #1   Title Pt/caregiver will verbalize understanding of updated adaptive strategies/AE for ADLs prn.--check LTGs 04/18/20    Time 8    Period Weeks    Status New      OT LONG TERM GOAL #2   Title Pt will demo improved grasp of RW with BUEs at least 75% of the time without cueing.    Time 8    Period Weeks    Status New      OT LONG TERM GOAL #3   Title Pt will be able to sign name with at least 75% legibility.    Time 8    Period Weeks    Status New      OT LONG TERM GOAL #4   Title Pt/wife with report improved ability to grasp spoon/fork when eating using AE/strategies prn.    Time 8    Period Weeks    Status New      OT LONG TERM GOAL #5   Title ---                 Plan - 03/30/20 8182    Clinical Impression Statement Pt is progressing towards goals. Pt/ wife verbalize understanding of functional activities to perfrom at home.    OT Occupational Profile and History Detailed Assessment- Review of Records and additional review of physical, cognitive, psychosocial history related to current functional performance    Occupational performance deficits (Please refer to evaluation for details): ADL's;IADL's;Leisure;Social Participation    Body Structure / Function / Physical Skills ADL;Dexterity;Vision    Rehab Potential Good    Clinical Decision Making Several treatment options, min-mod task modification necessary    Comorbidities Affecting Occupational Performance: May have  comorbidities impacting occupational performance    Modification or Assistance to Complete Evaluation  Min-Moderate modification of tasks or assist with assess necessary to complete  eval    OT Frequency 1x / week    OT Duration 8 weeks   +eval   OT Treatment/Interventions Self-care/ADL training;DME and/or AE instruction;Balance training;Therapeutic activities;Aquatic Therapy;Cognitive remediation/compensation;Therapeutic exercise;Neuromuscular education;Functional Mobility Training;Passive range of motion;Visual/perceptual remediation/compensation;Patient/family education;Manual Therapy    Plan continue with strategies for ADLs/caregiver education (grasp objects/walker, washing/drying hands),    Consulted and Agree with Plan of Care Patient;Family member/caregiver    Family Member Consulted wife--[name]           Patient will benefit from skilled therapeutic intervention in order to improve the following deficits and impairments:   Body Structure / Function / Physical Skills: ADL, Dexterity, Vision       Visit Diagnosis: Other symptoms and signs involving the nervous system  Abnormal posture  Attention and concentration deficit    Problem List Patient Active Problem List   Diagnosis Date Noted  . Hypothyroidism 02/18/2018  . Peripheral neuropathy 11/14/2016  . PSP (progressive supranuclear palsy) ([place]) 11/08/2016  . Tubular adenoma of colon 01/23/2016  . Lumbar stenosis 06/06/2015  . HOCM (hypertrophic obstructive cardiomyopathy) ([place]) 12/12/2014  . Testosterone deficiency 11/12/2013  . Mixed hyperlipidemia 08/30/2013  . 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 Parkinsonism ([place]) 01/16/2013  . GLAUCOMA 06/01/2008  . Essential hypertension 06/01/2008  . Seasonal and perennial allergic rhinitis 06/01/2008    Lavonna Lampron 03/30/2020, 5:00 PM  [facility] [address] [place], [place], [address] Phone:  [phone]   Fax:  [phone]  Name: Antowan Samford MRN: [id number] Date of Birth: October 16, 1945

## 2020-04-04 ENCOUNTER — Other Ambulatory Visit: Payer: Self-pay | Admitting: Family Medicine

## 2020-04-05 ENCOUNTER — Encounter: Payer: Self-pay | Admitting: Physical Therapy

## 2020-04-05 ENCOUNTER — Ambulatory Visit: Payer: Medicare Other

## 2020-04-05 ENCOUNTER — Encounter: Payer: Self-pay | Admitting: Occupational Therapy

## 2020-04-05 ENCOUNTER — Ambulatory Visit: Payer: Medicare Other | Admitting: Occupational Therapy

## 2020-04-05 ENCOUNTER — Other Ambulatory Visit: Payer: Self-pay

## 2020-04-05 ENCOUNTER — Ambulatory Visit: Payer: Medicare Other | Admitting: Physical Therapy

## 2020-04-05 DIAGNOSIS — R2681 Unsteadiness on feet: Secondary | ICD-10-CM

## 2020-04-05 DIAGNOSIS — R41841 Cognitive communication deficit: Secondary | ICD-10-CM

## 2020-04-05 DIAGNOSIS — R293 Abnormal posture: Secondary | ICD-10-CM

## 2020-04-05 DIAGNOSIS — R41844 Frontal lobe and executive function deficit: Secondary | ICD-10-CM

## 2020-04-05 DIAGNOSIS — R1310 Dysphagia, unspecified: Secondary | ICD-10-CM

## 2020-04-05 DIAGNOSIS — R471 Dysarthria and anarthria: Secondary | ICD-10-CM

## 2020-04-05 DIAGNOSIS — R29818 Other symptoms and signs involving the nervous system: Secondary | ICD-10-CM

## 2020-04-05 DIAGNOSIS — R41840 Attention and concentration deficit: Secondary | ICD-10-CM

## 2020-04-05 DIAGNOSIS — R41842 Visuospatial deficit: Secondary | ICD-10-CM

## 2020-04-05 DIAGNOSIS — R2689 Other abnormalities of gait and mobility: Secondary | ICD-10-CM

## 2020-04-05 NOTE — Therapy (Signed)
[facility] [address] [place], [place], [address] Phone: [phone]   Fax:  [phone]  Speech Language Pathology Treatment  Patient Details  Name: [name] MRN: [id number] Date of Birth: Dec 16, 1945 Referring Provider (SLP): Star Age, MD   Encounter Date: 04/05/2020   End of Session - 04/05/20 1812    Visit Number 5    Number of Visits 9    Date for SLP Re-Evaluation 05/13/20    SLP Start Time 1150    SLP Stop Time  1230    SLP Time Calculation (min) 40 min    Activity Tolerance Patient tolerated treatment well           Past Medical History:  Diagnosis Date   Allergic rhinitis    Allergy    Arthritis    At risk for falls    Cancer ([place])    basal cell skin cancer   Cataract    Complication of anesthesia    Slow to awaken   Constipation    DDD (degenerative disc disease), cervical    Dysphagia    Edema extremities 01/13/2016   GERD (gastroesophageal reflux disease)    Decdreased, has decreased coffee intack.   Glaucoma    HTN (hypertension)    Hx of adenomatous colonic polyps 01/23/2016   Hyperlipidemia    Hypogonadism male    Hypothyroidism    OSA (obstructive sleep apnea)    Has had surgery x 2   Parkinsonism ([place]) 01/16/2013   no tremors, balance issues only , speech and swallowing difficulties    Pre-diabetes    Sleep apnea    surgery x 2- no cpap   Staph infection 10-2014   Thyroid disease    HYPOTHYROID    Past Surgical History:  Procedure Laterality Date   CATARACT EXTRACTION     COLONOSCOPY  04-27-2004   tics, hems    I & D EXTREMITY Right 10/18/2014   Procedure: IRRIGATION AND DEBRIDEMENT RIGHT KNEE PREPATELLA BURSA INFECTION;  Surgeon: [name], MD;  Location: [facility];  Service: Orthopedics;  Laterality: Right;   LUMBAR DISC SURGERY  04-2015   LUMBAR LAMINECTOMY WITH COFLEX 1 LEVEL N/A 06/06/2015   Procedure: Lumbar three-four  Laminectomy/Foraminotomy with placement of Coflex;  Surgeon: [name], MD;  Location: [facility];  Service: Neurosurgery;  Laterality: N/A;   NASAL SEPTUM SURGERY     TONSILLECTOMY     and adenoids   UVULOPALATOPHARYNGOPLASTY      There were no vitals filed for this visit.   Subjective Assessment - 04/05/20 1203    Patient is accompained by: --   Wife [name]   Currently in Pain? No/denies                 ADULT SLP TREATMENT - 04/05/20 1206      General Information   Behavior/Cognition Alert;Pleasant mood;Impulsive      Dysphagia Treatment   Other treatment/comments "Onion rings" (pt). Wife stated pt has tried some a few different times and choked on them every time - wife choosing not to have this as an option for pt at this time. SLP reiterated food items that are safe for pt, and pt's swalow precautions which have been helpful in session ("small bite" "small sip", "swallow again") . Wife states she has to use these consistently with pt at home and pt follows intermittently. SLP told wife that pt's intermittent adherence might be due to pt personality but also likely due  to cognition affected by PSP.      Cognitive-Linquistic Treatment   Treatment focused on Dysarthria    Skilled Treatment SLP reinforced pt's need to slow down speech and articulate one word at a time. Pt cont'd to require cues usually for slower speech rate - wife better at comprehending pt than SLP. Pt practiced his 10 words/phrases with occasional cues to slow pace and usual cues for increasing loudness ("Shout!"). Pt often repeats his utterance multiple times instead of slowing down and "shouting" with one attempt; SLP reminded pt of this today as wife stated pt cont to do this at home. SLP stressed importance of practice at home.SLP encouraged wife to use modeling with pt when doing his 10 words/sentences and pt wife indicated she is doing this.       Assessment / Recommendations / Plan   Plan Continue  with current plan of care      Dysphagia Recommendations   Diet recommendations Dysphagia 3 (mechanical soft)    Liquids provided via Straw;Cup    Medication Administration Whole meds with puree    Supervision Intermittent supervision to cue for compensatory strategies    Compensations Slow rate;Small sips/bites;Multiple dry swallows after each bite/sip;Clear throat intermittently      Progression Toward Goals   Progression toward goals Progressing toward goals            SLP Education - 04/05/20 1808    Education provided Yes    Education Details articulating one word at a time to slow speaking pace, modeing pt's target sentences, swallow precautions that were effectinve in session, intermittent folowing swallow precautions partly due to decr'd cognition    Person(s) Educated Patient;Spouse    Methods Explanation;Demonstration;Verbal cues    Comprehension Verbalized understanding;Returned demonstration;Verbal cues required;Need further instruction              SLP Long Term Goals - 04/05/20 1813      SLP LONG TERM GOAL #1   Title pt will produce 80% of his chosen communicative phrases/sentences with functional intelligibility in 3 sessions    Time 5    Period Weeks   or visits, for all LTGs   Status On-going      SLP LONG TERM GOAL #2   Title pt will indicate understanding of 4 s/s aspiration PNA    Time 5    Period Weeks   or visits   Status On-going      SLP LONG TERM GOAL #3   Title to benefit pt's pulmonary health, wife will demo appropriate cueing for pt during POs over two sessions    Time 5    Period Weeks   or visits   Status On-going      SLP LONG TERM GOAL #4   Title in 3 sessions, pt communicative partner/wife will demo appropriate compensatory measures to assist in pt-listener communication    Time 5    Period Weeks   or visits   Status On-going            Plan - 04/05/20 1812    Clinical Impression Statement Pt presents today with significant  decline in speech intelligibility from previous course of therapy 3 years ago. Pt cont with cognitive linguistic deficits at least at same levels as noted in prior ST courses. See "skilled intervention" and "other comments" above for details. Skilled speech therapy will cont to focus mainly on intelligibility of pt's functional language, and compensatory strategies for wife to use with pt to maximize  his communication, and safety with POs.    Speech Therapy Frequency 1x /week   pt desired once/week as opposed to x2/week   Duration --   8 weeks/9 total sessions   Treatment/Interventions Aspiration precaution training;Diet toleration management by SLP;Cueing hierarchy;Patient/family education;SLP instruction and feedback;Internal/external aids;Compensatory techniques;Environmental controls;Pharyngeal strengthening exercises;Language facilitation;Cognitive reorganization;Functional tasks;Trials of upgraded texture/liquids;Multimodal communcation approach;Oral motor exercises    Potential to Achieve Goals Fair    Potential Considerations Severity of impairments;Previous level of function;Medical prognosis;Cooperation/participation level;Ability to learn/carryover information    Consulted and Agree with Plan of Care Patient;Family member/caregiver    Family Member Consulted [name]           Patient will benefit from skilled therapeutic intervention in order to improve the following deficits and impairments:   Dysarthria and anarthria  Dysphagia, unspecified type  Cognitive communication deficit    Problem List Patient Active Problem List   Diagnosis Date Noted   Hypothyroidism 02/18/2018   Peripheral neuropathy 11/14/2016   PSP (progressive supranuclear palsy) ([place]) 11/08/2016   Tubular adenoma of colon 01/23/2016   Lumbar stenosis 06/06/2015   HOCM (hypertrophic obstructive cardiomyopathy) ([place]) 12/12/2014   Testosterone deficiency 11/12/2013   Mixed hyperlipidemia 08/30/2013     Impotence of organic origin 08/30/2013   Parkinsonism ([place]) 01/16/2013   GLAUCOMA 06/01/2008   Essential hypertension 06/01/2008   Seasonal and perennial allergic rhinitis 06/01/2008    [facility] ,MS, CCC-SLP  04/05/2020, 6:14 PM  [facility] [address] [place], [place], [address] Phone: [phone]   Fax:  [phone]   Name: Kymani Shimabukuro MRN: [id number] Date of Birth: [birth year]

## 2020-04-05 NOTE — Therapy (Signed)
[facility] [address] [place], [place], [address] Phone: [phone]   Fax:  [phone]  Physical Therapy Treatment  Patient Details  Name: [name] MRN: [id number] Date of Birth: [birth year] Referring Provider (PT): Star Age   Encounter Date: 04/05/2020   PT End of Session - 04/05/20 1517    Visit Number 8    Number of Visits 9    Date for PT Re-Evaluation 63/81/77   90 day cert for 9 week POC   Authorization Type Medicare/BCBS    Progress Note Due on Visit 10    PT Start Time 1100    PT Stop Time 1143    PT Time Calculation (min) 43 min    Equipment Utilized During Treatment Gait belt    Activity Tolerance Patient tolerated treatment well;Other (comment)   impulsive at times   Behavior During Therapy [facility] for tasks assessed/performed;Impulsive   impulsive at times with transfers          Past Medical History:  Diagnosis Date  . Allergic rhinitis   . Allergy   . Arthritis   . At risk for falls   . Cancer ([place])    basal cell skin cancer  . Cataract   . Complication of anesthesia    Slow to awaken  . Constipation   . DDD (degenerative disc disease), cervical   . Dysphagia   . Edema extremities 01/13/2016  . GERD (gastroesophageal reflux disease)    Decdreased, has decreased coffee intack.  . Glaucoma   . HTN (hypertension)   . Hx of adenomatous colonic polyps 01/23/2016  . Hyperlipidemia   . Hypogonadism male   . Hypothyroidism   . OSA (obstructive sleep apnea)    Has had surgery x 2  . Parkinsonism ([place]) 01/16/2013   no tremors, balance issues only , speech and swallowing difficulties   . Pre-diabetes   . Sleep apnea    surgery x 2- no cpap  . Staph infection 10-2014  . Thyroid disease    HYPOTHYROID    Past Surgical History:  Procedure Laterality Date  . CATARACT EXTRACTION    . COLONOSCOPY  04-27-2004   tics, hems   . I & D EXTREMITY Right 10/18/2014   Procedure: IRRIGATION AND  DEBRIDEMENT RIGHT KNEE PREPATELLA BURSA INFECTION;  Surgeon: [name], MD;  Location: [facility];  Service: Orthopedics;  Laterality: Right;  . [place] SURGERY  04-2015  . LUMBAR LAMINECTOMY WITH COFLEX 1 LEVEL N/A 06/06/2015   Procedure: Lumbar three-four Laminectomy/Foraminotomy with placement of Coflex;  Surgeon: [name], MD;  Location: [facility];  Service: Neurosurgery;  Laterality: N/A;  . NASAL SEPTUM SURGERY    . TONSILLECTOMY     and adenoids  . UVULOPALATOPHARYNGOPLASTY      There were no vitals filed for this visit.   Subjective Assessment - 04/05/20 1103    Subjective No falls. Still having difficulty getting up from the chair. Still having freezing episodes while turning.    Patient is accompained by: Family member   daughter   Pertinent History PSP, hx of falls    Patient Stated Goals See above for wife's list of things to work on-pt agreeable to these goals    Currently in Pain? No/denies                             [facility] Adult PT Treatment/Exercise - 04/05/20 1115  Transfers   Transfers Sit to Stand;Stand to Sit    Sit to Stand 4: Min guard;With upper extremity assist;From bed;From chair/3-in-1    Stand to Sit 4: Min guard;With upper extremity assist;To chair/3-in-1;To bed    Transfer Cueing performed massed practice of sit <> stands throughout session today (upon pt and pt's wife request), pt needing cues at times to slow down due to pt being impulsive and trying to stand before getting into set-up position, pt needing cues to scoot towards edge of mat, get feet under him, stand tall and perform a BIG NOSE OVER TOES in order to stand tall without bracing BLE on mat table, cues for proper hand placement too and to use BUE to help push off mat to stand. Cues for slowed descent when sitting back down to mat. Discussed with wife proper cues to use at home in order to decr retropulsion with wife able to verbalize understanding. In between sit <>  stands also performed 2 x 10 reps of practicing forward weight shift over toes to practice shifting weight anteriorly before performing transfer     Comments TUG shuttle: short distance gait from mat > standard height arm chair 10 ft away with focus on sit <> stand technique, wide BOS during gait and when performing turns. Performed 2 reps - verbal cues provided throughout, needing one instance to stop and re-set to get tall due to pt having one freezing episode when attempting to turn. cues to fully turn RW before sitting down to mat      Ambulation/Gait   Ambulation/Gait Yes    Ambulation/Gait Assistance 4: Min guard;4: Min assist    Ambulation/Gait Assistance Details throughout clinic, plus 5 x 30' reps navigating obstacles (performing a figure 8 between 2 chairs) for obstacle negotitation, cues to slow down and time, for posture and to maintain a wide BOS when performing turns     Ambulation Distance (Feet) 115 Feet    Assistive device Rolling walker    Gait Pattern Step-through pattern;Decreased step length - right;Decreased step length - left;Decreased dorsiflexion - right;Right steppage    Ambulation Surface Level;Indoor    Gait velocity 14.84 seconds= 2.21 ft/sec with RW    Gait Comments --      Self-Care   Self-Care --    Other Self-Care Comments  --      Therapeutic Activites    Therapeutic Activities Other Therapeutic Activities    Other Therapeutic Activities discussed with pt and pt's wife fall prevention strategies in the home as well as tips to decr freezing episodes, with pt and pt's wife able to verbalize understanding when therapist asking to name the steps. (able to repeat, stop, stand tall, weight shift and take a large step)                  PT Education - 04/05/20 1517    Education Details reviewed strategies during freezing episodes, sit <> stand training with appropriate cues    Person(s) Educated Patient;Spouse    Methods Explanation;Demonstration;Verbal  cues    Comprehension Returned demonstration;Verbalized understanding            PT Short Term Goals - 03/14/20 1652      PT SHORT TERM GOAL #1   Title Pt will perform HEP for strength, flexibility, balance and gait with wife's supervision.  TARGET 03/18/2020    Time 5    Period Weeks    Status Achieved      PT SHORT TERM GOAL #2  Title Pt will perform at least 4 of 5 reps of sit<>stand, demonstrating proper sit<>stand technique with supervision, for improved functional strength and safety/efficiency with transfers.    Baseline requires min guard assist, 4 of 5 trials correct technique    Time 5    Period Weeks    Status Partially Met      PT SHORT TERM GOAL #3   Title Pt will improve TUG score to less than or equal to 30 seconds for decreased fall risk.    Baseline 36.6 sec at eval; 29.41 sec 03/14/2020    Time 5    Period Weeks    Status Achieved      PT SHORT TERM GOAL #4   Title Pt/wife will report at least 25% improvement in bed mobility, for improved safety/decreased caregiver burden with bed moiblity.    Baseline Reports 50% improvement in bed mobility 03/14/2020    Time 5    Period Weeks    Status Achieved             PT Long Term Goals - 04/05/20 1108      PT LONG TERM GOAL #1   Title Pt/wife will verbalize understanding of fall prevention, including tips to reduce freezing.  TARGET 04/15/2020    Baseline Wife and pt able to verbalize understanding.    Time 9    Period Weeks    Status Achieved      PT LONG TERM GOAL #2   Title Wife will demonstrate/verbalize appropriate cueing and assistance with pt and RW for safety with doorways and threshold negotiation.    Time 9    Period Weeks    Status New      PT LONG TERM GOAL #3   Title Pt will improve gait velocity to at least 2.3 ft/sec for improved efficiency and safety with gait.    Baseline 2.1 ft at eval, 14.84 seconds= 2.21 ft/sec with RW    Time 9    Period Weeks    Status Not Met      PT LONG TERM  GOAL #4   Title Pt/wife will verbalize/demo understanding of safety with walker negotiation on outdoor surfaces and unlevel surfaces, at least 500 ft, for improved participation in community ambulation.    Time 9    Period Weeks    Status New                 Plan - 04/05/20 1644    Clinical Impression Statement Today's skilled session focused on gait training, transfer training, and reviewing strategies to decr freezing with pt and pt's wife. Began to address pt's LTGs. Pt and pt's spouse met LTG #1 today - able to verbalize understanding of fall prevention and techniques to decr freezing episodes. Pt's gait speed today with RW was 2.21 ft/sec, improved from 2.1 ft/sec at eval, but not quite to goal level of 2.3 ft/sec. Heavy focus of today's session was sit <> stand training for proper technique and education to wife for proper cueing for pt. Pt needs frequent cues in order to take his time and perform slowly, as pt still has tendency to perform too quickly and brace BLE posteriorly against mat table. Pt with improvement when taking his time and really focusing on BIG nose over toes for weight shift forward. Will assess remainder of LTGs at next session.    Personal Factors and Comorbidities Comorbidity 3+    Comorbidities GERD, HTN DDD cervical spine, arthritis, OSA  Examination-Activity Limitations Bed Mobility;Locomotion Level;Transfers;Stand    Examination-Participation Restrictions Community Activity    Stability/Clinical Decision Making Evolving/Moderate complexity    Rehab Potential Good    PT Frequency 1x / week    PT Duration Other (comment)   8 weeks, plus eval visit; total POC = 9 weeks   PT Treatment/Interventions ADLs/Self Care Home Management;DME Instruction;Neuromuscular re-education;Balance training;Therapeutic exercise;Therapeutic activities;Functional mobility training;Stair training;Gait training;Patient/family education    PT Next Visit Plan check remainder of LTGs,  pt at end of POC, anticipate D/C? Continue to work on transfer training, stairs, gait training. safety education with gait-doorways, thresholds, varied surfaces.    PT Home Exercise Plan [id number]    Consulted and Agree with Plan of Care Patient;Family member/caregiver    Family Member Consulted pt's spouse           Patient will benefit from skilled therapeutic intervention in order to improve the following deficits and impairments:  Abnormal gait, Difficulty walking, Decreased safety awareness, Decreased balance, Decreased mobility, Decreased strength, Postural dysfunction  Visit Diagnosis: Other symptoms and signs involving the nervous system  Abnormal posture  Other abnormalities of gait and mobility  Unsteadiness on feet     Problem List Patient Active Problem List   Diagnosis Date Noted  . Hypothyroidism 02/18/2018  . Peripheral neuropathy 11/14/2016  . PSP (progressive supranuclear palsy) ([place]) 11/08/2016  . Tubular adenoma of colon 01/23/2016  . Lumbar stenosis 06/06/2015  . HOCM (hypertrophic obstructive cardiomyopathy) ([place]) 12/12/2014  . Testosterone deficiency 11/12/2013  . Mixed hyperlipidemia 08/30/2013  . 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 Parkinsonism ([place]) 01/16/2013  . GLAUCOMA 06/01/2008  . Essential hypertension 06/01/2008  . Seasonal and perennial allergic rhinitis 06/01/2008    Arliss Journey, PT, DPT  04/05/2020, 4:53 PM  [facility] [address] [place], [place], [address] Phone: [phone]   Fax:  [phone]  Name: [name] MRN: [id number] Date of Birth: August 06, 1946

## 2020-04-05 NOTE — Therapy (Addendum)
[facility] [address] [place], [place], [address] Phone: [phone]   Fax:  [phone]  Occupational Therapy Treatment  Patient Details  Name: [name] MRN: [id number] Date of Birth: [birth year] Referring Provider (OT): Dr. Star Age   Encounter Date: 04/05/2020   OT End of Session - 04/05/20 1235    Visit Number 5    Number of Visits 9    Date for OT Re-Evaluation 04/17/20    Authorization Type Medicare / BCBS, covered 100%    Authorization Time Period --    Authorization - Visit Number 5    Authorization - Number of Visits 10    Progress Note Due on Visit 10    OT Start Time 1237    OT Stop Time 1312   pt tired   OT Time Calculation (min) 35 min    Activity Tolerance Patient tolerated treatment well    Behavior During Therapy [facility] for tasks assessed/performed;Impulsive           Past Medical History:  Diagnosis Date  . Allergic rhinitis   . Allergy   . Arthritis   . At risk for falls   . Cancer ([place])    basal cell skin cancer  . Cataract   . Complication of anesthesia    Slow to awaken  . Constipation   . DDD (degenerative disc disease), cervical   . Dysphagia   . Edema extremities 01/13/2016  . GERD (gastroesophageal reflux disease)    Decdreased, has decreased coffee intack.  . Glaucoma   . HTN (hypertension)   . Hx of adenomatous colonic polyps 01/23/2016  . Hyperlipidemia   . Hypogonadism male   . Hypothyroidism   . OSA (obstructive sleep apnea)    Has had surgery x 2  . Parkinsonism ([place]) 01/16/2013   no tremors, balance issues only , speech and swallowing difficulties   . Pre-diabetes   . Sleep apnea    surgery x 2- no cpap  . Staph infection 10-2014  . Thyroid disease    HYPOTHYROID    Past Surgical History:  Procedure Laterality Date  . CATARACT EXTRACTION    . COLONOSCOPY  04-27-2004   tics, hems   . I & D EXTREMITY Right 10/18/2014   Procedure: IRRIGATION AND  DEBRIDEMENT RIGHT KNEE PREPATELLA BURSA INFECTION;  Surgeon: [name], MD;  Location: [place];  Service: Orthopedics;  Laterality: Right;  . [place] SURGERY  04-2015  . LUMBAR LAMINECTOMY WITH COFLEX 1 LEVEL N/A 06/06/2015   Procedure: Lumbar three-four Laminectomy/Foraminotomy with placement of Coflex;  Surgeon: [name], MD;  Location: [place] [facility];  Service: Neurosurgery;  Laterality: N/A;  . NASAL SEPTUM SURGERY    . TONSILLECTOMY     and adenoids  . UVULOPALATOPHARYNGOPLASTY      There were no vitals filed for this visit.   Subjective Assessment - 04/05/20 1244    Subjective  wife requests d/c all therapies next week    Patient is accompanied by: Family member   [name]   Pertinent History Progressive Supranuclear Palsy (PSP).PMH:  HTN, Glaucoma, hyperlipidemia, testosterone deficiency, lumbar stenosis, peripheral neuropathy, hypothyroidism, DDD, hx of multiple falls, dysphagia, GERD    Limitations fall risk, cognitive deficits, impulsivity; visual deficits, swallowing deficits    Patient Stated Goals writing/signing name, getting up from chair, grasping walker/holding utensil, washing/drying hands    Currently in Pain? No/denies             Practiced  signing/writing name with at least 75% legibility consistently and 100% legibility at times.  Pt given min-mod cues for upright position of pen and moving paper after middle initial with improved letter size noted.  Min cueing for stretching hand at times, but pt also self initiated.   Washing hands and drying hands standing at sink with min-mod cueing for large amplitude movements and to completely dry hands.    Cleaning hands with sanitizer with mod cueing for large amplitude movements.  Functional grasp/release of large cylinder objects with each hand and opening "jar" container with min cueing for opening hand big and turning wrist big (large amplitude movement strategies).  Sit>stand and grasp on walker with min  cueing for sit>stand, good grasp on walker; however after washing hands, pt cued by wife for R hand grasp on walker.          OT Short Term Goals - 04/05/20 1419      OT SHORT TERM GOAL #1   Title Pt will perform HEP with focus on large amplitude, functional movements.--check STGs 03/19/20    Time 4    Period Weeks    Status On-going      OT SHORT TERM GOAL #2   Title Pt will wash/dry hands adequately with min cueing.    Time 4    Period Weeks    Status On-going   04/05/20:  min-mod cueing     OT SHORT TERM GOAL #3   Title Pt will be able to sign name with at least 50% legibility    Time 4    Period Weeks    Status Achieved      OT SHORT TERM GOAL #4   Title ---             OT Long Term Goals - 04/05/20 1419      OT LONG TERM GOAL #1   Title Pt/caregiver will verbalize understanding of updated adaptive strategies/AE for ADLs prn.--check LTGs 04/18/20    Time 8    Period Weeks    Status On-going      OT LONG TERM GOAL #2   Title Pt will demo improved grasp of RW with BUEs at least 75% of the time without cueing.    Time 8    Period Weeks    Status On-going      OT LONG TERM GOAL #3   Title Pt will be able to sign name with at least 75% legibility.    Time 8    Period Weeks    Status Achieved      OT LONG TERM GOAL #4   Title Pt/wife with report improved ability to grasp spoon/fork when eating using AE/strategies prn.    Time 8    Period Weeks    Status Achieved   04/05/20 per wife report     OT LONG TERM GOAL #5   Title ---                 Plan - 04/05/20 1418    Clinical Impression Statement Pt is progressing towards goals.  Pt demo improving writing legibility, and improved grasp for eating and on walker, but will continue to need cueing    OT Occupational Profile and History Detailed Assessment- Review of Records and additional review of physical, cognitive, psychosocial history related to current functional performance    Occupational  performance deficits (Please refer to evaluation for details): ADL's;IADL's;Leisure;Social Participation    Body Structure / Function / Physical  Skills ADL;Dexterity;Vision    Rehab Potential Good    Clinical Decision Making Several treatment options, min-mod task modification necessary    Comorbidities Affecting Occupational Performance: May have comorbidities impacting occupational performance    Modification or Assistance to Complete Evaluation  Min-Moderate modification of tasks or assist with assess necessary to complete eval    OT Frequency 1x / week    OT Duration 8 weeks   +eval   OT Treatment/Interventions Self-care/ADL training;DME and/or AE instruction;Balance training;Therapeutic activities;Aquatic Therapy;Cognitive remediation/compensation;Therapeutic exercise;Neuromuscular education;Functional Mobility Training;Passive range of motion;Visual/perceptual remediation/compensation;Patient/family education;Manual Therapy    Plan check goals and anticipate d/c next week (per pt/wife request)    Consulted and Agree with Plan of Care Patient;Family member/caregiver    Family Member Consulted wife--[name]           Patient will benefit from skilled therapeutic intervention in order to improve the following deficits and impairments:   Body Structure / Function / Physical Skills: ADL, Dexterity, Vision       Visit Diagnosis: Other symptoms and signs involving the nervous system  Abnormal posture  Attention and concentration deficit  Frontal lobe and executive function deficit  Other abnormalities of gait and mobility  Visuospatial deficit  Unsteadiness on feet    Problem List Patient Active Problem List   Diagnosis Date Noted  . Hypothyroidism 02/18/2018  . Peripheral neuropathy 11/14/2016  . PSP (progressive supranuclear palsy) ([place]) 11/08/2016  . Tubular adenoma of colon 01/23/2016  . Lumbar stenosis 06/06/2015  . HOCM (hypertrophic obstructive cardiomyopathy)  ([place]) 12/12/2014  . Testosterone deficiency 11/12/2013  . Mixed hyperlipidemia 08/30/2013  . 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 Parkinsonism ([place]) 01/16/2013  . GLAUCOMA 06/01/2008  . Essential hypertension 06/01/2008  . Seasonal and perennial allergic rhinitis 06/01/2008    [facility] 04/05/2020, 8:53 PM  [facility] [address] [place], [place], [address] Phone: [phone]   Fax:  [phone]  Name: [name] MRN: [id number] Date of Birth: 24-Jul-1946   Vianne Bulls, OTR/L [facility] [address] [place], [place]  [address] [phone] phone [phone] 04/05/20 8:53 PM

## 2020-04-12 ENCOUNTER — Ambulatory Visit: Payer: Medicare Other | Admitting: Occupational Therapy

## 2020-04-12 ENCOUNTER — Other Ambulatory Visit: Payer: Self-pay

## 2020-04-12 ENCOUNTER — Encounter: Payer: Self-pay | Admitting: Occupational Therapy

## 2020-04-12 ENCOUNTER — Ambulatory Visit: Payer: Medicare Other | Attending: Family Medicine | Admitting: Physical Therapy

## 2020-04-12 ENCOUNTER — Ambulatory Visit: Payer: Medicare Other

## 2020-04-12 DIAGNOSIS — R1310 Dysphagia, unspecified: Secondary | ICD-10-CM | POA: Insufficient documentation

## 2020-04-12 DIAGNOSIS — R41844 Frontal lobe and executive function deficit: Secondary | ICD-10-CM

## 2020-04-12 DIAGNOSIS — R2681 Unsteadiness on feet: Secondary | ICD-10-CM | POA: Insufficient documentation

## 2020-04-12 DIAGNOSIS — R471 Dysarthria and anarthria: Secondary | ICD-10-CM | POA: Diagnosis not present

## 2020-04-12 DIAGNOSIS — R41842 Visuospatial deficit: Secondary | ICD-10-CM

## 2020-04-12 DIAGNOSIS — R41841 Cognitive communication deficit: Secondary | ICD-10-CM | POA: Insufficient documentation

## 2020-04-12 DIAGNOSIS — R293 Abnormal posture: Secondary | ICD-10-CM | POA: Diagnosis not present

## 2020-04-12 DIAGNOSIS — R41840 Attention and concentration deficit: Secondary | ICD-10-CM | POA: Diagnosis not present

## 2020-04-12 DIAGNOSIS — R29818 Other symptoms and signs involving the nervous system: Secondary | ICD-10-CM | POA: Diagnosis not present

## 2020-04-12 DIAGNOSIS — R2689 Other abnormalities of gait and mobility: Secondary | ICD-10-CM

## 2020-04-12 NOTE — Therapy (Signed)
[facility] [address] [place], [place], [address] Phone: [phone]   Fax:  [phone]  Occupational Therapy Treatment  Patient Details  Name: [name] MRN: [id number] Date of Birth: 1946/02/18 Referring Provider (OT): Dr. Star Age   Encounter Date: 04/12/2020   OT End of Session - 04/12/20 1232    Visit Number 6    Number of Visits 9    Date for OT Re-Evaluation 04/17/20    Authorization Type Medicare / BCBS, covered 100%    Authorization Time Period --    Authorization - Visit Number 6    Authorization - Number of Visits 10    Progress Note Due on Visit 10    OT Start Time 1233    OT Stop Time 1315    OT Time Calculation (min) 42 min    Activity Tolerance Patient tolerated treatment well    Behavior During Therapy [facility] for tasks assessed/performed;Impulsive           Past Medical History:  Diagnosis Date  . Allergic rhinitis   . Allergy   . Arthritis   . At risk for falls   . Cancer ([place])    basal cell skin cancer  . Cataract   . Complication of anesthesia    Slow to awaken  . Constipation   . DDD (degenerative disc disease), cervical   . Dysphagia   . Edema extremities 01/13/2016  . GERD (gastroesophageal reflux disease)    Decdreased, has decreased coffee intack.  . Glaucoma   . HTN (hypertension)   . Hx of adenomatous colonic polyps 01/23/2016  . Hyperlipidemia   . Hypogonadism male   . Hypothyroidism   . OSA (obstructive sleep apnea)    Has had surgery x 2  . Parkinsonism ([place]) 01/16/2013   no tremors, balance issues only , speech and swallowing difficulties   . Pre-diabetes   . Sleep apnea    surgery x 2- no cpap  . Staph infection 10-2014  . Thyroid disease    HYPOTHYROID    Past Surgical History:  Procedure Laterality Date  . CATARACT EXTRACTION    . COLONOSCOPY  04-27-2004   tics, hems   . I & D EXTREMITY Right 10/18/2014   Procedure: IRRIGATION AND DEBRIDEMENT RIGHT  KNEE PREPATELLA BURSA INFECTION;  Surgeon: [name], MD;  Location: [place];  Service: Orthopedics;  Laterality: Right;  . [place] SURGERY  04-2015  . LUMBAR LAMINECTOMY WITH COFLEX 1 LEVEL N/A 06/06/2015   Procedure: Lumbar three-four Laminectomy/Foraminotomy with placement of Coflex;  Surgeon: [name], MD;  Location: [place] [facility];  Service: Neurosurgery;  Laterality: N/A;  . NASAL SEPTUM SURGERY    . TONSILLECTOMY     and adenoids  . UVULOPALATOPHARYNGOPLASTY      There were no vitals filed for this visit.   Subjective Assessment - 04/12/20 1232    Subjective  1 fall since last Friday fell back, scraped L forearm (after washing hands in bathroom)    Patient is accompanied by: Family member   [name]   Pertinent History Progressive Supranuclear Palsy (PSP).PMH:  HTN, Glaucoma, hyperlipidemia, testosterone deficiency, lumbar stenosis, peripheral neuropathy, hypothyroidism, DDD, hx of multiple falls, dysphagia, GERD    Limitations fall risk, cognitive deficits, impulsivity; visual deficits, swallowing deficits    Patient Stated Goals writing/signing name, getting up from chair, grasping walker/holding utensil, washing/drying hands    Currently in Pain? No/denies  Reviewed and Practiced signing/writing name with at least 75% legibility consistently and 100% legibility at times.  Pt given min-mod cues for upright position of pen and moving paper after middle initial with improved letter size noted.  Min cueing for stretching hand at times, but pt also self initiated.   Practiced holding and pressing numbers on remote with min-mod cues for strategies:  PWR! Hands, lifting finger between each number (2nd digit), look at remote, use only 1 hand to press buttons.   Discussed/educated pt in importance of looking and person he is talking to and hands when performing task in sitting as reinforcement of movement amplitude.       Reviewed flipping cards from HEP with  min cueing for incr movement amplitude. And min cueing to say card # out loud to encourage voice volume and eye movement.    Discussed progress and follow-up screen/evaluation in approx 6 months.    Using hand sanitizer to clean hands with min-mod cueing for large amplitude movements.     OT Education - 04/12/20 1438    Education Details Strategies for using remote, reviewed writing strategies    Person(s) Educated Patient;Spouse    Methods Explanation;Demonstration;Verbal cues    Comprehension Verbalized understanding;Returned demonstration;Verbal cues required            OT Short Term Goals - 04/12/20 1440      OT SHORT TERM GOAL #1   Title Pt will perform HEP with focus on large amplitude, functional movements.--check STGs 03/19/20    Time 4    Period Weeks    Status Achieved      OT SHORT TERM GOAL #2   Title Pt will wash/dry hands adequately with min cueing.    Time 4    Period Weeks    Status Not Met   04/05/20:  min-mod cueing     OT SHORT TERM GOAL #3   Title Pt will be able to sign name with at least 50% legibility    Time 4    Period Weeks    Status Achieved      OT SHORT TERM GOAL #4   Title ---             OT Long Term Goals - 04/12/20 1441      OT LONG TERM GOAL #1   Title Pt/caregiver will verbalize understanding of updated adaptive strategies/AE for ADLs prn.--check LTGs 04/18/20    Time 8    Period Weeks    Status Achieved      OT LONG TERM GOAL #2   Title Pt will demo improved grasp of RW with BUEs at least 75% of the time without cueing.    Time 8    Period Weeks    Status Achieved   04/12/20:  at approx this level in clinic     OT [place] #3   Title Pt will be able to sign name with at least 75% legibility.    Time 8    Period Weeks    Status Achieved      OT LONG TERM GOAL #4   Title Pt/wife with report improved ability to grasp spoon/fork when eating using AE/strategies prn.    Time 8    Period Weeks    Status Achieved    04/05/20 per wife report     OT LONG TERM GOAL #5   Title ---  Plan - 04/12/20 1232    Clinical Impression Statement Pt made progress towards goals and is appropriate for d/c.  Wife verbalizes understanding of appropriate cueing for pt for use of strategies.    OT Occupational Profile and History Detailed Assessment- Review of Records and additional review of physical, cognitive, psychosocial history related to current functional performance    Occupational performance deficits (Please refer to evaluation for details): ADL's;IADL's;Leisure;Social Participation    Body Structure / Function / Physical Skills ADL;Dexterity;Vision    Rehab Potential Good    Clinical Decision Making Several treatment options, min-mod task modification necessary    Comorbidities Affecting Occupational Performance: May have comorbidities impacting occupational performance    Modification or Assistance to Complete Evaluation  Min-Moderate modification of tasks or assist with assess necessary to complete eval    OT Frequency 1x / week    OT Duration 8 weeks   +eval   OT Treatment/Interventions Self-care/ADL training;DME and/or AE instruction;Balance training;Therapeutic activities;Aquatic Therapy;Cognitive remediation/compensation;Therapeutic exercise;Neuromuscular education;Functional Mobility Training;Passive range of motion;Visual/perceptual remediation/compensation;Patient/family education;Manual Therapy    Plan d/c OT    Consulted and Agree with Plan of Care Patient;Family member/caregiver    Family Member Consulted wife--[name]           Patient will benefit from skilled therapeutic intervention in order to improve the following deficits and impairments:   Body Structure / Function / Physical Skills: ADL, Dexterity, Vision       Visit Diagnosis: Other symptoms and signs involving the nervous system  Abnormal posture  Attention and concentration deficit  Frontal lobe and  executive function deficit  Other abnormalities of gait and mobility  Visuospatial deficit  Unsteadiness on feet    Problem List Patient Active Problem List   Diagnosis Date Noted  . Hypothyroidism 02/18/2018  . Peripheral neuropathy 11/14/2016  . PSP (progressive supranuclear palsy) ([place]) 11/08/2016  . Tubular adenoma of colon 01/23/2016  . Lumbar stenosis 06/06/2015  . HOCM (hypertrophic obstructive cardiomyopathy) ([place]) 12/12/2014  . Testosterone deficiency 11/12/2013  . Mixed hyperlipidemia 08/30/2013  . 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 Parkinsonism ([place]) 01/16/2013  . GLAUCOMA 06/01/2008  . Essential hypertension 06/01/2008  . Seasonal and perennial allergic rhinitis 06/01/2008    OCCUPATIONAL THERAPY DISCHARGE SUMMARY  Visits from Start of Care: 6  Current functional level related to goals / functional outcomes: See above   Remaining deficits: Bradykinesia/hypokinesia, rigidity, decr coordination, abnormal posture, decr balance/functional mobility, timing deficits, cognitive deficits, visual deficits   Education / Equipment: Pt was instructed in the following:  Updated HEP, adaptive strategies for ADLs.  Pt verbalized understanding of all education provided.   Plan: Patient agrees to discharge.  Patient goals were partially met. Patient is being discharged due to being pleased with the current functional level.  Pt would benefit from occupational therapy evaluation/screen in approx 6-9 months to assess for need for further therapy/functional changes due to progressive nature of diagnosis.    [facility] 04/12/2020, 2:41 PM  [facility] [address] [place], [place], [address] Phone: [phone]   Fax:  [phone]  Name: [name] MRN: [id number] Date of Birth: September 13, 1946   Vianne Bulls, OTR/L [facility] [address] [place], [place]   [address] [phone] phone [phone] 04/12/20 2:41 PM

## 2020-04-12 NOTE — Patient Instructions (Addendum)
  Please limit [name]'s distractions during meals  Please cue [name] consistently for small sips/bites  Please give [name] time to respond to verbal requests - we talked about asking him to swallow and waiting 7 seconds prior to asking again.  ============================================  Signs of Aspiration Pneumonia   . Chest pain/tightness . Fever (can be low grade) . Cough  o With foul-smelling phlegm (sputum) o With sputum containing pus or blood o With greenish sputum . Fatigue  . Shortness of breath  . Wheezing   **IF YOU HAVE THESE SIGNS, CONTACT YOUR DOCTOR OR GO TO THE EMERGENCY DEPARTMENT OR URGENT CARE AS SOON AS POSSIBLE**

## 2020-04-12 NOTE — Therapy (Signed)
[facility] [address] [place], [place], [address] Phone: [phone]   Fax:  [phone]  Speech Language Pathology Treatment/Discharge summary  Patient Details  Name: [name] MRN: [id number] Date of Birth: 10/23/1945 Referring Provider (SLP): Star Age, MD   Encounter Date: 04/12/2020   End of Session - 04/12/20 1348    Visit Number 6    Number of Visits 9    Date for SLP Re-Evaluation 05/13/20    SLP Start Time 1150    SLP Stop Time  1229    SLP Time Calculation (min) 39 min    Activity Tolerance Patient tolerated treatment well           Past Medical History:  Diagnosis Date  . Allergic rhinitis   . Allergy   . Arthritis   . At risk for falls   . Cancer ([place])    basal cell skin cancer  . Cataract   . Complication of anesthesia    Slow to awaken  . Constipation   . DDD (degenerative disc disease), cervical   . Dysphagia   . Edema extremities 01/13/2016  . GERD (gastroesophageal reflux disease)    Decdreased, has decreased coffee intack.  . Glaucoma   . HTN (hypertension)   . Hx of adenomatous colonic polyps 01/23/2016  . Hyperlipidemia   . Hypogonadism male   . Hypothyroidism   . OSA (obstructive sleep apnea)    Has had surgery x 2  . Parkinsonism ([place]) 01/16/2013   no tremors, balance issues only , speech and swallowing difficulties   . Pre-diabetes   . Sleep apnea    surgery x 2- no cpap  . Staph infection 10-2014  . Thyroid disease    HYPOTHYROID    Past Surgical History:  Procedure Laterality Date  . CATARACT EXTRACTION    . COLONOSCOPY  04-27-2004   tics, hems   . I & D EXTREMITY Right 10/18/2014   Procedure: IRRIGATION AND DEBRIDEMENT RIGHT KNEE PREPATELLA BURSA INFECTION;  Surgeon: [name], MD;  Location: [facility];  Service: Orthopedics;  Laterality: Right;  . [place] SURGERY  04-2015  . LUMBAR LAMINECTOMY WITH COFLEX 1 LEVEL N/A 06/06/2015   Procedure: Lumbar  three-four Laminectomy/Foraminotomy with placement of Coflex;  Surgeon: [name], MD;  Location: [facility];  Service: Neurosurgery;  Laterality: N/A;  . NASAL SEPTUM SURGERY    . TONSILLECTOMY     and adenoids  . UVULOPALATOPHARYNGOPLASTY      There were no vitals filed for this visit.          ADULT SLP TREATMENT - 04/12/20 1219      General Information   Behavior/Cognition Alert;Pleasant mood;Impulsive      Treatment Provided   Treatment provided Dysphagia      Dysphagia Treatment   Other treatment/comments Wife concerned about pt swallowing as pt with "30-minute coughing episode" on Saturday with "a speck" of a baked bean. SLP encouraged pt that SLP would have done the same things to elicit motor behavior of spitting food item out. SLP noted today that wife cues pt too often for motoric activity not allowing pt to process and respond appropriately. SLP cued pt wife to "ask him to swallow and then count 7 seconds in your head- cue him again if he hasn't swallowed." SLP observed pt with think liquids and cued him for small sips 100% of the time. Delayed cough on 5/10 sipsand immediate cough 2/10 sips. SLP reiterated to  wife that distractions were detrimental to pt focus on swallowing precautions and that she and family needed to cue pt consistently. Wife affirmed family cues pt consistently but stated there were distractions when pt with his episode. SLP educated pt/wife again re: hydrophonia ("wet voice") - SLP modeled slowed rate of speech in ordre to enhance pt processing, and re-stating message to enhance comprehension/processing. SLP highlighted these compensations for wife.       Assessment / Recommendations / Plan   Plan --   discharge - wife requests; wife requests MBSS     Dysphagia Recommendations   Diet recommendations Dysphagia 3 (mechanical soft)    Liquids provided via Straw;Cup    Medication Administration Whole meds with puree    Supervision Full  supervision/cueing for compensatory strategies    Compensations Slow rate;Small sips/bites;Multiple dry swallows after each bite/sip;Clear throat intermittently      Progression Toward Goals   Progression toward goals --   see goals - d/c day           SLP Education - 04/12/20 1347    Education Details see "pt instructions", overt s/sx aspiration PNA    Person(s) Educated Patient;Spouse    Methods Explanation;Demonstration;Verbal cues    Comprehension Verbal cues required;Returned demonstration;Verbalized understanding;Need further instruction              SLP Long Term Goals - 04/12/20 [place] #1   Title pt will produce 80% of his chosen communicative phrases/sentences with functional intelligibility in 3 sessions    Period --   or visits, for all LTGs   Status Not Met      SLP LONG TERM GOAL #2   Title pt will indicate understanding of 4 s/s aspiration PNA    Status Achieved      SLP LONG TERM GOAL #3   Title to benefit pt's pulmonary health, wife will demo appropriate cueing for pt during POs over two sessions    Period --   or visits   Status Not Met      SLP LONG TERM GOAL #4   Title in 3 sessions, pt communicative partner/wife will demo appropriate compensatory measures to assist in pt-listener communication    Period --   or visits   Status Partially Met            Plan - 04/12/20 1348    Clinical Impression Statement Pt presents today with significant decline in speech intelligibility from previous course of therapy 3 years ago. Wife tells SLP today pt had significant coughing episode over the weekend; requests modified barium swallow (MBSS). Pt cont with cognitive linguistic deficits at least at same levels as noted in prior ST courses. See  "other comments" above for details. [place] today due to max potential reached - wife would like to cont to work with pt swallow and speech at home.    Speech Therapy Frequency 1x /week   pt desired  once/week as opposed to x2/week   Duration --   8 weeks/9 total sessions   Treatment/Interventions Aspiration precaution training;Diet toleration management by SLP;Cueing hierarchy;Patient/family education;SLP instruction and feedback;Internal/external aids;Compensatory techniques;Environmental controls;Pharyngeal strengthening exercises;Language facilitation;Cognitive reorganization;Functional tasks;Trials of upgraded texture/liquids;Multimodal communcation approach;Oral motor exercises    Potential to Achieve Goals Fair    Potential Considerations Severity of impairments;Previous level of function;Medical prognosis;Cooperation/participation level;Ability to learn/carryover information    Consulted and Agree with Plan of Care Patient;Family member/caregiver    Family Member Consulted wife  [name]           Patient will benefit from skilled therapeutic intervention in order to improve the following deficits and impairments:   Dysphagia, unspecified type  Cognitive communication deficit  Dysarthria and anarthria   SPEECH THERAPY DISCHARGE SUMMARY  Visits from Start of Care: 6  Current functional level related to goals / functional outcomes: See goal update above. Pt made minimal gains in ST in 5 therapy sessions. Pt progress was inconsistent from session to session and mostly, thearpy was more for caregiver education. Pt with significant coughing episode over the weekend and SLP believes pt would benefit from updated modified barium swallow eval (MBSS).   Remaining deficits: Dysphagia, dysarthria, cognitive communication defiicts   Education / Equipment: S/sx aspiration PNA, how to cue pt and WAIT for response instead of requesting again 2 and 3 and 4 seconds later   Plan: Patient agrees to discharge.  Patient goals were not met. Patient is being discharged due to the patient's request.  ?????       Problem List Patient Active Problem List   Diagnosis Date Noted  .  Hypothyroidism 02/18/2018  . Peripheral neuropathy 11/14/2016  . PSP (progressive supranuclear palsy) ([place]) 11/08/2016  . Tubular adenoma of colon 01/23/2016  . Lumbar stenosis 06/06/2015  . HOCM (hypertrophic obstructive cardiomyopathy) ([facility]) 12/12/2014  . Testosterone deficiency 11/12/2013  . Mixed hyperlipidemia 08/30/2013  . 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 Parkinsonism ([place]) 01/16/2013  . GLAUCOMA 06/01/2008  . Essential hypertension 06/01/2008  . Seasonal and perennial allergic rhinitis 06/01/2008    [facility] ,[place], CCC-SLP  04/12/2020, 1:51 PM  [facility] [address] [place], [place], [address] Phone: [phone]   Fax:  [phone]   Name: [name] MRN: [id number] Date of Birth: [birth year]

## 2020-04-13 NOTE — Therapy (Signed)
[facility] [address] [place], [place], [address] Phone: [phone]   Fax:  [phone]  Physical Therapy Treatment/Discharge Summary  Patient Details  Name: [name] MRN: [id number] Date of Birth: 19-Mar-1946 Referring Provider (PT): Star Age   Encounter Date: 04/12/2020   PT End of Session - 04/13/20 1355    Visit Number 9    Number of Visits 9    Date for PT Re-Evaluation 44/58/48   90 day cert for 9 week POC   Authorization Type Medicare/BCBS    Progress Note Due on Visit 10    PT Start Time 1101    PT Stop Time 1145    PT Time Calculation (min) 44 min    Equipment Utilized During Treatment Gait belt    Activity Tolerance Patient tolerated treatment well;Other (comment)   impulsive at times   Behavior During Therapy [facility] for tasks assessed/performed;Impulsive   impulsive at times with transfers          Past Medical History:  Diagnosis Date  . Allergic rhinitis   . Allergy   . Arthritis   . At risk for falls   . Cancer ([place])    basal cell skin cancer  . Cataract   . Complication of anesthesia    Slow to awaken  . Constipation   . DDD (degenerative disc disease), cervical   . Dysphagia   . Edema extremities 01/13/2016  . GERD (gastroesophageal reflux disease)    Decdreased, has decreased coffee intack.  . Glaucoma   . HTN (hypertension)   . Hx of adenomatous colonic polyps 01/23/2016  . Hyperlipidemia   . Hypogonadism male   . Hypothyroidism   . OSA (obstructive sleep apnea)    Has had surgery x 2  . Parkinsonism ([place]) 01/16/2013   no tremors, balance issues only , speech and swallowing difficulties   . Pre-diabetes   . Sleep apnea    surgery x 2- no cpap  . Staph infection 10-2014  . Thyroid disease    HYPOTHYROID    Past Surgical History:  Procedure Laterality Date  . CATARACT EXTRACTION    . COLONOSCOPY  04-27-2004   tics, hems   . I & D EXTREMITY Right 10/18/2014   Procedure:  IRRIGATION AND DEBRIDEMENT RIGHT KNEE PREPATELLA BURSA INFECTION;  Surgeon: [name], MD;  Location: [facility];  Service: Orthopedics;  Laterality: Right;  . [place] SURGERY  04-2015  . LUMBAR LAMINECTOMY WITH COFLEX 1 LEVEL N/A 06/06/2015   Procedure: Lumbar three-four Laminectomy/Foraminotomy with placement of Coflex;  Surgeon: [name], MD;  Location: [facility];  Service: Neurosurgery;  Laterality: N/A;  . NASAL SEPTUM SURGERY    . TONSILLECTOMY     and adenoids  . UVULOPALATOPHARYNGOPLASTY      There were no vitals filed for this visit.   Subjective Assessment - 04/12/20 1104    Subjective Had one fall Friday, washing hands at the sink and closed eyes-fell backwards.  Wife states he is closing his eyes more. Wife reports overall reduction in number of falls and that he seems to concentrate more at home when he is doing therapy sessions.    Patient is accompained by: Family member   wife   Pertinent History PSP, hx of falls    Patient Stated Goals See above for wife's list of things to work on-pt agreeable to these goals    Currently in Pain? No/denies  [facility] Adult PT Treatment/Exercise - 04/12/20 1110      Transfers   Transfers Sit to Stand;Stand to Sit    Sit to Stand 4: Min guard;With upper extremity assist;From bed;From chair/3-in-1    Stand to Sit 4: Min guard;With upper extremity assist;To chair/3-in-1;To bed    Number of Reps 2 sets;Other reps (comment)   5 reps from mat table   Comments TUG shuttle: short distance gait from mat > standard height arm chair 10 ft away with focus on sit <> stand technique, wide BOS during gait and when performing turns. Performed 3 reps - verbal cues provided throughout; pt demonstrating freezing with turning x 2 reps when feet more narrowed; cues to STOP and reset to march feet to lift; cues to fully turn RW before sitting down to mat      Ambulation/Gait   Ambulation/Gait Yes     Ambulation/Gait Assistance 4: Min guard;4: Min assist    Ambulation/Gait Assistance Details Negotiating doorways x 2, with min guard, with pt staying in middle of doorway and no evidence of freezing or stopping with gait through doorway.    Ambulation Distance (Feet) 500 Feet    Assistive device Rolling walker    Gait Pattern Step-through pattern;Decreased step length - right;Decreased step length - left;Decreased dorsiflexion - right;Right steppage    Ambulation Surface Level;Indoor    Stairs Yes    Stairs Assistance 4: Min assist    Stairs Assistance Details (indicate cue type and reason) Used R rail and SPC in LUE.  Cues provided throughout for [facility] first, then step-to pattern.  Pt performs x 2 reps, no evidence of posterior lean.  Wife verbally cues pt to lean forward to avoid posterior lean.    Stair Management Technique One rail Right;Step to pattern;Forwards;With cane   Cane in LUE   Number of Stairs 4   x 2   Height of Stairs 6          Therapeutic Activity, continued:  performed massed practice of sit <> stands throughout session again today (upon pt and pt's wife request), pt needing cues at times to slow down due to pt being impulsive and trying to stand before getting into set-up position, pt needing cues to scoot towards edge of mat, get feet under him, stand tall and perform a BIG NOSE OVER TOES in order to stand tall without bracing BLE on mat table, cues for proper hand placement too and to use BUE to help push off mat to stand.  Pt tries to start this process prior to cueing and readiness from therapist.  PT cues patient and wife that pt needs to WAIT for cues from wife at home prior to initiating transfer to ensure optimal safety.  Cues for slowed descent when sitting back down to mat. Discussed again with wife proper cues to use at home in order to decr retropulsion with wife able to verbalize understanding.          PT Short Term Goals - 03/14/20 1652      PT SHORT  TERM GOAL #1   Title Pt will perform HEP for strength, flexibility, balance and gait with wife's supervision.  TARGET 03/18/2020    Time 5    Period Weeks    Status Achieved      PT SHORT TERM GOAL #2   Title Pt will perform at least 4 of 5 reps of sit<>stand, demonstrating proper sit<>stand technique with supervision, for improved functional strength and safety/efficiency with transfers.  Baseline requires min guard assist, 4 of 5 trials correct technique    Time 5    Period Weeks    Status Partially Met      PT SHORT TERM GOAL #3   Title Pt will improve TUG score to less than or equal to 30 seconds for decreased fall risk.    Baseline 36.6 sec at eval; 29.41 sec 03/14/2020    Time 5    Period Weeks    Status Achieved      PT SHORT TERM GOAL #4   Title Pt/wife will report at least 25% improvement in bed mobility, for improved safety/decreased caregiver burden with bed moiblity.    Baseline Reports 50% improvement in bed mobility 03/14/2020    Time 5    Period Weeks    Status Achieved             PT Long Term Goals - 04/12/20 1108      PT LONG TERM GOAL #1   Title Pt/wife will verbalize understanding of fall prevention, including tips to reduce freezing.  TARGET 04/15/2020    Baseline Wife and pt able to verbalize understanding.    Time 9    Period Weeks    Status Achieved      PT LONG TERM GOAL #2   Title Wife will demonstrate/verbalize appropriate cueing and assistance with pt and RW for safety with doorways and threshold negotiation.    Time 9    Period Weeks    Status Achieved      PT LONG TERM GOAL #3   Title Pt will improve gait velocity to at least 2.3 ft/sec for improved efficiency and safety with gait.    Baseline 2.1 ft at eval, 14.84 seconds= 2.21 ft/sec with RW    Time 9    Period Weeks    Status Not Met      PT LONG TERM GOAL #4   Title Pt/wife will verbalize/demo understanding of safety with walker negotiation on outdoor surfaces and unlevel surfaces,  at least 500 ft, for improved participation in community ambulation.    Time 9    Period Weeks    Status Achieved                 Plan - 04/13/20 1355    Clinical Impression Statement Assessed remaining LTGs this visit, with pt/wife meeting LTG 2 and 4, with wife verbalizing safety techniques for doorway negotiation and for stairs and outdoor gait.  Pt has had one fall in the past week, but wife reports overall has had decrease in falls during the course of therapy.  Pt remains impulsive with transfers and gait at times; however, wife verbalizes and demo (coming in and out of sessions) good understanding of apporpriate cues for optimal safety.  Pt is appropriate for d/c this visit; he will benefit from return PT eval in 6-9 months due to progressive nature of disease process.    Personal Factors and Comorbidities Comorbidity 3+    Comorbidities GERD, HTN DDD cervical spine, arthritis, OSA    Examination-Activity Limitations Bed Mobility;Locomotion Level;Transfers;Stand    Examination-Participation Restrictions Community Activity    Stability/Clinical Decision Making Evolving/Moderate complexity    Rehab Potential Good    PT Frequency 1x / week    PT Duration Other (comment)   8 weeks, plus eval visit; total POC = 9 weeks   PT Treatment/Interventions ADLs/Self Care Home Management;DME Instruction;Neuromuscular re-education;Balance training;Therapeutic exercise;Therapeutic activities;Functional mobility training;Stair training;Gait training;Patient/family education  PT Next Visit Plan D/C this visit; recommend PT return eval in 6-9 months    PT Home Exercise Plan [id number]    Consulted and Agree with Plan of Care Patient;Family member/caregiver    Family Member Consulted pt's spouse           Patient will benefit from skilled therapeutic intervention in order to improve the following deficits and impairments:  Abnormal gait, Difficulty walking, Decreased safety awareness, Decreased  balance, Decreased mobility, Decreased strength, Postural dysfunction  Visit Diagnosis: Other abnormalities of gait and mobility  Other symptoms and signs involving the nervous system  Unsteadiness on feet     Problem List Patient Active Problem List   Diagnosis Date Noted  . Hypothyroidism 02/18/2018  . Peripheral neuropathy 11/14/2016  . PSP (progressive supranuclear palsy) ([place]) 11/08/2016  . Tubular adenoma of colon 01/23/2016  . Lumbar stenosis 06/06/2015  . HOCM (hypertrophic obstructive cardiomyopathy) ([place]) 12/12/2014  . Testosterone deficiency 11/12/2013  . Mixed hyperlipidemia 08/30/2013  . 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 Parkinsonism ([place]) 01/16/2013  . GLAUCOMA 06/01/2008  . Essential hypertension 06/01/2008  . Seasonal and perennial allergic rhinitis 06/01/2008    Masha Orbach W. 04/13/2020, 1:59 PM Frazier Butt., PT  [facility] [address] [place], [place], [address] Phone: [phone]   Fax:  [phone]  Name: [name] MRN: [id number] Date of Birth: 20-Jun-1946   PHYSICAL THERAPY DISCHARGE SUMMARY  Visits from Start of Care: 9  Current functional level related to goals / functional outcomes:  PT Long Term Goals - 04/12/20 1108      PT LONG TERM GOAL #1   Title Pt/wife will verbalize understanding of fall prevention, including tips to reduce freezing.  TARGET 04/15/2020    Baseline Wife and pt able to verbalize understanding.    Time 9    Period Weeks    Status Achieved      PT LONG TERM GOAL #2   Title Wife will demonstrate/verbalize appropriate cueing and assistance with pt and RW for safety with doorways and threshold negotiation.    Time 9    Period Weeks    Status Achieved      PT LONG TERM GOAL #3   Title Pt will improve gait velocity to at least 2.3 ft/sec for improved efficiency and safety with gait.    Baseline 2.1 ft at eval, 14.84 seconds= 2.21 ft/sec  with RW    Time 9    Period Weeks    Status Not Met      PT LONG TERM GOAL #4   Title Pt/wife will verbalize/demo understanding of safety with walker negotiation on outdoor surfaces and unlevel surfaces, at least 500 ft, for improved participation in community ambulation.    Time 9    Period Weeks    Status Achieved          Pt has met 3 of 4 LTGs.   Remaining deficits: Impulsivity, retropulsion with transfers, decreased safety awareness   Education / Equipment: Education provided to pt and wife on fall prevention, safety with transfers, gait, stairs and doorways-wife verbalizes understanding and return demo understanding (coming into and out of therapy with patient).  Plan: Patient agrees to discharge.  Patient goals were partially met. Patient is being discharged due to being pleased with the current functional level.  ?????Recommend return PT eval in 6-9 months due to progressive nature of disease process.         Mady Haagensen, PT 04/13/20  2:03 PM Phone: [phone] Fax: [phone]

## 2020-04-19 ENCOUNTER — Encounter: Payer: Medicare Other | Admitting: Occupational Therapy

## 2020-04-19 ENCOUNTER — Telehealth: Payer: Self-pay

## 2020-04-19 DIAGNOSIS — R1310 Dysphagia, unspecified: Secondary | ICD-10-CM

## 2020-04-19 DIAGNOSIS — G231 Progressive supranuclear ophthalmoplegia [Steele-Richardson-Olszewski]: Secondary | ICD-10-CM

## 2020-04-19 NOTE — Addendum Note (Signed)
Addended by: Star Age on: 04/19/2020 04:41 PM   Modules accepted: Orders

## 2020-04-19 NOTE — Telephone Encounter (Signed)
MBSS ordered as recommended by speech therapist evaluation.

## 2020-04-19 NOTE — Telephone Encounter (Signed)
Pt's wife LVM asking for a call to discuss a swallow study recommended by neuro rehab.

## 2020-04-19 NOTE — Telephone Encounter (Signed)
I reached out to the pt's wife ( ok per dpr). She sts on 04/12/2020 pt had eval with SLP Garald Balding and he recommended the pt have a modified barium swallow evaluation.   Pt's wife was advised to contact our office in regards to getting this order placed. Will fwd to MD.

## 2020-04-20 ENCOUNTER — Other Ambulatory Visit (HOSPITAL_COMMUNITY): Payer: Self-pay

## 2020-04-20 DIAGNOSIS — R1310 Dysphagia, unspecified: Secondary | ICD-10-CM

## 2020-04-20 DIAGNOSIS — R059 Cough, unspecified: Secondary | ICD-10-CM

## 2020-04-20 NOTE — Telephone Encounter (Signed)
Called and spoke to patient's wife she is going to call Jarrett Soho a call back at [phone] and reschedule apt. Thanks [employer].

## 2020-04-26 ENCOUNTER — Encounter: Payer: Medicare Other | Admitting: Occupational Therapy

## 2020-04-27 ENCOUNTER — Ambulatory Visit (HOSPITAL_COMMUNITY)
Admission: RE | Admit: 2020-04-27 | Discharge: 2020-04-27 | Disposition: A | Discharge status: Home / Self Care | Payer: Medicare Other | Source: Ambulatory Visit | Attending: Neurology | Admitting: Neurology

## 2020-04-27 ENCOUNTER — Other Ambulatory Visit: Payer: Self-pay

## 2020-04-27 DIAGNOSIS — G231 Progressive supranuclear ophthalmoplegia [Steele-Richardson-Olszewski]: Secondary | ICD-10-CM

## 2020-04-27 DIAGNOSIS — R1310 Dysphagia, unspecified: Secondary | ICD-10-CM | POA: Diagnosis not present

## 2020-04-27 DIAGNOSIS — R059 Cough, unspecified: Secondary | ICD-10-CM

## 2020-04-27 DIAGNOSIS — R05 Cough: Secondary | ICD-10-CM | POA: Insufficient documentation

## 2020-04-27 DIAGNOSIS — Z0389 Encounter for observation for other suspected diseases and conditions ruled out: Secondary | ICD-10-CM | POA: Diagnosis not present

## 2020-04-27 NOTE — Progress Notes (Signed)
Modified Barium Swallow Progress Note  Patient Details  Name: [name] MRN: [id number] Date of Birth: September 04, 1946  Today's Date: 04/27/2020  Modified Barium Swallow completed.  Full report located under Chart Review in the Imaging Section.  Brief recommendations include the following:  Clinical Impression  Pt demonstrates severe orpharyngeal dysphagia with aspiration events with nectar thick liquids before the swallow in significant quantity, followed by prolonged hard coughing. Aspiration occured both due to delay of hyoid excursion, but also decreased vestibular closure during the swallow. Pt was able to follow all my commands with minimal repetition. Severity of quantity of aspiration decreased with increased viscosity and with decreased bolus size, though frank penetration occurred regardless with some trace aspiration post swallow of vestibular residual. Trialed chin tuck with variable result. Best cues were, with both nectar and honey, to briefly hold the bolus in his mouth, wait, try to hold his breath and swallow. Pt did not fully achieve laryngeal closure with the breath hold cue, but he did approximate laryngeal closure early, prior to bolus transit, significantly decreasing penetration events. Likely the oral hold alone will be helpful even if pt cannot follow the breath hold cue consistently. solids were masticated adeqautely; base of tongue weakness led to mild vallecular residual with solids. Wife reported the she felt that using strategies was a futile effort. Pt will likely aspirate equally between thin and nectar, honey alone, without strategies, reduced aspiration, but increases mild residue and is not without aspiration risk.    Swallow Evaluation Recommendations       SLP Diet Recommendations: Dysphagia 1 (Puree) solids;Honey thick liquids   Liquid Administration via: Cup;Spoon   Medication Administration: Crushed with puree       Compensations: Slow rate;Small  sips/bites (hold bolus in your mouth before swallow, hold breath)               Herbie Baltimore, MA CCC-SLP  Acute Rehabilitation Services Pager [phone] Office [phone]  Othelia Pulling, Katherene Ponto 04/27/2020,2:43 PM

## 2020-04-27 NOTE — Progress Notes (Signed)
Please call patient's wife regarding his swallow study.  He has a tendency to aspirate thin liquids.  The recommendation was that he have pured food and honey thick liquids, liquids to be administered via spoon or cup and medications crushed with pure, he is also advised to continue to eat and swallow slowly, use small sips and bites.

## 2020-04-28 ENCOUNTER — Telehealth: Payer: Self-pay

## 2020-04-28 NOTE — Telephone Encounter (Signed)
-----   Message from Star Age, MD sent at 04/27/2020  5:52 PM EDT ----- Please call patient's wife regarding his swallow study.  He has a tendency to aspirate thin liquids.  The recommendation was that he have pured food and honey thick liquids, liquids to be administered via spoon or cup and medications crushed with pure, he is also advised to continue to eat and swallow slowly, use small sips and bites.

## 2020-04-28 NOTE — Telephone Encounter (Signed)
I contacted the pt's wife and advised of result. She verbalized understanding on recommendations. She will continue to work with the pt at home on these recommendations.

## 2020-05-03 ENCOUNTER — Encounter: Payer: Medicare Other | Admitting: Occupational Therapy

## 2020-05-24 DIAGNOSIS — G1229 Other motor neuron disease: Secondary | ICD-10-CM | POA: Diagnosis not present

## 2020-05-24 DIAGNOSIS — H409 Unspecified glaucoma: Secondary | ICD-10-CM | POA: Diagnosis not present

## 2020-05-24 DIAGNOSIS — I10 Essential (primary) hypertension: Secondary | ICD-10-CM | POA: Diagnosis not present

## 2020-05-24 DIAGNOSIS — G231 Progressive supranuclear ophthalmoplegia [Steele-Richardson-Olszewski]: Secondary | ICD-10-CM | POA: Diagnosis not present

## 2020-05-24 DIAGNOSIS — G4700 Insomnia, unspecified: Secondary | ICD-10-CM | POA: Diagnosis not present

## 2020-05-24 DIAGNOSIS — G4730 Sleep apnea, unspecified: Secondary | ICD-10-CM | POA: Diagnosis not present

## 2020-05-24 DIAGNOSIS — G20 Parkinson's disease: Secondary | ICD-10-CM | POA: Diagnosis not present

## 2020-05-24 DIAGNOSIS — J309 Allergic rhinitis, unspecified: Secondary | ICD-10-CM | POA: Diagnosis not present

## 2020-05-24 DIAGNOSIS — E785 Hyperlipidemia, unspecified: Secondary | ICD-10-CM | POA: Diagnosis not present

## 2020-05-24 DIAGNOSIS — K117 Disturbances of salivary secretion: Secondary | ICD-10-CM | POA: Diagnosis not present

## 2020-05-30 ENCOUNTER — Telehealth: Payer: Self-pay | Admitting: Family Medicine

## 2020-05-30 NOTE — Telephone Encounter (Signed)
FYI

## 2020-05-30 NOTE — Telephone Encounter (Signed)
Patient's wife is calling in, states she was told by the nurse that [name]'s blood pressure has been reading low. 01/19/20 bp was 105/66 and then 05/24/20 it was 109/68 -did schedule a follow up to discuss on 06/08/20

## 2020-06-01 DIAGNOSIS — Z85828 Personal history of other malignant neoplasm of skin: Secondary | ICD-10-CM | POA: Diagnosis not present

## 2020-06-01 DIAGNOSIS — D2261 Melanocytic nevi of right upper limb, including shoulder: Secondary | ICD-10-CM | POA: Diagnosis not present

## 2020-06-01 DIAGNOSIS — D2272 Melanocytic nevi of left lower limb, including hip: Secondary | ICD-10-CM | POA: Diagnosis not present

## 2020-06-01 DIAGNOSIS — D225 Melanocytic nevi of trunk: Secondary | ICD-10-CM | POA: Diagnosis not present

## 2020-06-01 DIAGNOSIS — L821 Other seborrheic keratosis: Secondary | ICD-10-CM | POA: Diagnosis not present

## 2020-06-08 ENCOUNTER — Ambulatory Visit (INDEPENDENT_AMBULATORY_CARE_PROVIDER_SITE_OTHER): Payer: Medicare Other | Admitting: Family Medicine

## 2020-06-08 ENCOUNTER — Encounter: Payer: Self-pay | Admitting: Family Medicine

## 2020-06-08 ENCOUNTER — Other Ambulatory Visit: Payer: Self-pay

## 2020-06-08 VITALS — BP 102/58 | HR 53 | Temp 97.6°F | Ht 70.0 in | Wt 177.2 lb

## 2020-06-08 DIAGNOSIS — G20 Parkinson's disease: Secondary | ICD-10-CM

## 2020-06-08 DIAGNOSIS — I10 Essential (primary) hypertension: Secondary | ICD-10-CM

## 2020-06-08 DIAGNOSIS — I421 Obstructive hypertrophic cardiomyopathy: Secondary | ICD-10-CM | POA: Diagnosis not present

## 2020-06-08 DIAGNOSIS — I952 Hypotension due to drugs: Secondary | ICD-10-CM

## 2020-06-08 NOTE — Progress Notes (Signed)
Subjective  CC:  Chief Complaint  Patient presents with  . orthostatics    dropping rapidly when going from laying to standing, has been on the same BP meds since before recent weight loss, around 40 oz of water daily    HPI: [name] is a 74 y.o. male who presents to the office today to address the problems listed above in the chief complaint.  Patient has been on losartan 100 mg for several years.  The last couple of years he has lost a significant amount of weight in part due to his Parkinson disease and dysphagia.  Home nurse has noted several occasions with low blood pressures.  Please refer to the my chart note.  Patient is noncommunicative.  His wife reports that at times he seems unsteady on his feet.  At times he will have falls, mostly when initiating movement.  No syncopal events.  He does have HOCM.  I reviewed most recent cardiology notes over the last several years.  I reviewed his last echocardiogram which was back in 2018.  Parkinson's disease and supranuclear palsy with movement disorder.  I reviewed most recent specialist notes.  He is on a pured diet at this point due to swallowing difficulties.  Assessment  1. Hypotension due to drugs   2. HOCM (hypertrophic obstructive cardiomyopathy) ([place])   3. Essential hypertension   4. Parkinson's disease ([place])      Plan    Hypertension f/u: BP control is overly controlled.  Now hypotensive.  Initial blood pressure was quite low.  Stop ARB and continue to monitor home readings.  Check 2D echocardiogram to follow-up on septal hypertrophy rule out outflow tract obstruction.  Fortunately he has follow-up with cardiology in 1 month.  His wife will let me know what blood pressure readings are doing in the next few weeks.  Monitor for safety.  Cautious when standing from lying or sitting position.  Continues to use a walker and wears a belt.  Parkinson's f/u: Continue follow-up with neurology and movement disorders clinic.  He  could have some autonomic dysfunction from this as well. Education regarding management of these chronic disease states was given. Management strategies discussed on successive visits include dietary and exercise recommendations, goals of achieving and maintaining IBW, and lifestyle modifications aiming for adequate sleep and minimizing stressors.   Follow up: Return for as scheduled.  No orders of the defined types were placed in this encounter.  No orders of the defined types were placed in this encounter.     BP Readings from Last 3 Encounters:  06/08/20 (!) 102/58  03/28/20 120/68  01/20/20 122/69   Wt Readings from Last 3 Encounters:  06/08/20 177 lb 3.2 oz (80.4 kg)  03/28/20 182 lb (82.6 kg)  11/30/19 179 lb (81.2 kg)    Lab Results  Component Value Date   CHOL 158 09/25/2019   CHOL 174 02/18/2018   CHOL 185 09/30/2015   Lab Results  Component Value Date   HDL 38 (L) 09/25/2019   HDL 37.80 (L) 02/18/2018   HDL 38.90 (L) 09/30/2015   Lab Results  Component Value Date   LDLCALC 96 09/25/2019   LDLCALC 108 (H) 09/30/2015   LDLCALC 124 (H) 09/28/2014   Lab Results  Component Value Date   TRIG 139 09/25/2019   TRIG 235.0 (H) 02/18/2018   TRIG 191.0 (H) 09/30/2015   Lab Results  Component Value Date   CHOLHDL 4.2 09/25/2019   CHOLHDL 5 02/18/2018  CHOLHDL 5 09/30/2015   Lab Results  Component Value Date   LDLDIRECT 107.0 02/18/2018   Lab Results  Component Value Date   CREATININE 0.73 01/19/2020   BUN 6 (L) 01/19/2020   NA 141 01/19/2020   K 4.9 01/19/2020   CL 105 01/19/2020   CO2 28 01/19/2020    The 10-year ASCVD risk score Mikey Bussing DC Jr., et al., 2013) is: 31.4%   Values used to calculate the score:     Age: 52 years     Sex: Male     Is Non-Hispanic African American: No     Diabetic: Yes     Tobacco smoker: No     Systolic Blood Pressure: 086 mmHg     Is BP treated: Yes     HDL Cholesterol: 38 mg/dL     Total Cholesterol: 158 mg/dL  I  reviewed the patients updated PMH, FH, and SocHx.    Patient Active Problem List   Diagnosis Date Noted  . Hypothyroidism 02/18/2018    Priority: High  . Peripheral neuropathy 11/14/2016    Priority: High  . PSP (progressive supranuclear palsy) ([place]) 11/08/2016    Priority: High  . Lumbar stenosis 06/06/2015    Priority: High  . HOCM (hypertrophic obstructive cardiomyopathy) ([place]) 12/12/2014    Priority: High  . Mixed hyperlipidemia 08/30/2013    Priority: High  . Parkinsonism ([place]) 01/16/2013    Priority: High  . Essential hypertension 06/01/2008    Priority: High  . Tubular adenoma of colon 01/23/2016    Priority: Medium  . GLAUCOMA 06/01/2008    Priority: Medium  . Testosterone deficiency 11/12/2013    Priority: Low  . Impotence of organic origin 08/30/2013    Priority: Low  . Seasonal and perennial allergic rhinitis 06/01/2008    Priority: Low    Allergies: Ambien [zolpidem tartrate], Atenolol, Lunesta [eszopiclone], Oxazepam, Requip [ropinirole hcl], and Ziac [bisoprolol-hydrochlorothiazide]  Social History: Patient  reports that he has never smoked. He has never used smokeless tobacco. He reports that he does not drink alcohol and does not use drugs.  Current Meds  Medication Sig  . aspirin 81 MG tablet Take 81 mg by mouth daily.  . Cholecalciferol (VITAMIN D) 2000 units CAPS Take 1 capsule by mouth once a week.   . fexofenadine (ALLEGRA) 60 MG tablet Take 60 mg by mouth daily.   . finasteride (PROSCAR) 5 MG tablet Take 5 mg by mouth daily.  . fluticasone (FLONASE) 50 MCG/ACT nasal spray SPRAY 1 SPRAY INTO EACH NOSTRIL EVERY DAY  . levothyroxine (SYNTHROID) 50 MCG tablet TAKE 1 TABLET BY MOUTH EVERY DAY  . Multiple Vitamins-Minerals (MULTIVITAMIN WITH MINERALS) tablet Take 1 tablet by mouth daily.    . Naproxen Sodium (ALEVE) 220 MG CAPS Take by mouth.  . polyethylene glycol (MIRALAX / GLYCOLAX) 17 g packet Take 17 g by mouth daily.  . rosuvastatin (CRESTOR) 5 MG  tablet Take 1 tablet (5 mg total) by mouth daily.  . timolol (BETIMOL) 0.5 % ophthalmic solution Place 1 drop into both eyes 2 (two) times daily.   . [DISCONTINUED] losartan (COZAAR) 100 MG tablet TAKE 1 TABLET BY MOUTH EVERY DAY    Review of Systems: Cardiovascular: negative for chest pain, palpitations, leg swelling, orthopnea Respiratory: negative for SOB, wheezing or persistent cough Gastrointestinal: negative for abdominal pain Genitourinary: negative for dysuria or gross hematuria  Objective  Vitals: BP (!) 102/58   Pulse (!) 53   Temp 97.6 F (36.4 C) (Temporal)  Ht 5\' 10"  (1.778 m)   Wt 177 lb 3.2 oz (80.4 kg)   SpO2 98%   BMI 25.43 kg/m  General: no acute distress  HEENT:  Normocephalic, atraumatic, supple neck  Cardiovascular: Distant heart sounds, regular rate and rhythm, did not hear murmur today Respiratory:  Good breath sounds bilaterally, CTAB with normal respiratory effort   Commons side effects, risks, benefits, and alternatives for medications and treatment plan prescribed today were discussed, and the patient expressed understanding of the given instructions. Patient is instructed to call or message via MyChart if he/she has any questions or concerns regarding our treatment plan. No barriers to understanding were identified. We discussed Red Flag symptoms and signs in detail. Patient expressed understanding regarding what to do in case of urgent or emergency type symptoms.   Medication list was reconciled, printed and provided to the patient in AVS. Patient instructions and summary information was reviewed with the patient as documented in the AVS. This note was prepared with assistance of Dragon voice recognition software. Occasional wrong-word or sound-a-like substitutions may have occurred due to the inherent limitations of voice recognition software  This visit occurred during the SARS-CoV-2 public health emergency.  Safety protocols were in place, including  screening questions prior to the visit, additional usage of staff PPE, and extensive cleaning of exam room while observing appropriate contact time as indicated for disinfecting solutions.

## 2020-06-08 NOTE — Patient Instructions (Addendum)
Please follow up as scheduled for your next visit with me: 09/23/2020   We will call you to get your echocardiogram done. I will send these results to Dr. [name].   Please stop taking losartan.   If you have any questions or concerns, please don't hesitate to send me a message via MyChart or call the office at [phone]. Thank you for visiting with [place] today! It's our pleasure caring for you.

## 2020-06-20 ENCOUNTER — Other Ambulatory Visit: Payer: Self-pay | Admitting: Family Medicine

## 2020-06-21 ENCOUNTER — Ambulatory Visit (HOSPITAL_COMMUNITY): Payer: Medicare Other | Attending: Cardiovascular Disease

## 2020-06-21 ENCOUNTER — Other Ambulatory Visit: Payer: Self-pay

## 2020-06-21 DIAGNOSIS — I952 Hypotension due to drugs: Secondary | ICD-10-CM | POA: Diagnosis not present

## 2020-06-21 DIAGNOSIS — I421 Obstructive hypertrophic cardiomyopathy: Secondary | ICD-10-CM | POA: Insufficient documentation

## 2020-06-21 LAB — ECHOCARDIOGRAM COMPLETE
Area-P 1/2: 2.45 cm2
S' Lateral: 3 cm

## 2020-06-23 NOTE — Progress Notes (Signed)
Please call patient: I have reviewed his/her lab results. The echo looks stable. I have forwarded to his cardiologist.  Are blood pressures improving off the losartan? Thanks.  Dr. [name], [name] will be seeing you for follow in October. He was having symptomatic orthostatic hypotension and we stopped his ARB. Here is his echo result.  Thanks! [employer]

## 2020-06-29 ENCOUNTER — Other Ambulatory Visit: Payer: Self-pay | Admitting: Family Medicine

## 2020-07-11 ENCOUNTER — Ambulatory Visit (INDEPENDENT_AMBULATORY_CARE_PROVIDER_SITE_OTHER): Payer: Medicare Other | Admitting: Cardiology

## 2020-07-11 ENCOUNTER — Other Ambulatory Visit: Payer: Self-pay

## 2020-07-11 VITALS — BP 110/54 | HR 53 | Ht 70.0 in | Wt 174.4 lb

## 2020-07-11 DIAGNOSIS — R600 Localized edema: Secondary | ICD-10-CM

## 2020-07-11 DIAGNOSIS — I421 Obstructive hypertrophic cardiomyopathy: Secondary | ICD-10-CM

## 2020-07-11 DIAGNOSIS — I10 Essential (primary) hypertension: Secondary | ICD-10-CM

## 2020-07-11 NOTE — Progress Notes (Addendum)
Cardiology Office Note:    Date:  07/11/2020   ID:  [name], DOB [birth year], MRN [id number]  PCP:  [name], MD  Cardiologist:  [name], MD    Referring MD: [name], MD   Chief Complaint  Patient presents with  . Follow-up    HOCM, HTN, LE edema    History of Present Illness:    [name] is a 74 y.o. male with a hx of HTN, OSA, Parkinson's, hyperlipidemia, pre- diabetes, asymmetric septal hypertrophy c/w HOCM with no outflow tact gradientand hypothyroidism. He was seen by Neuro at [facility], his Losartan was stopped due to soft BPs.  Recent 2D echo showed moderately BSH at 19mm with no SAM and no LVOT obstruction and trivial MR.  He is here today for followup and is doing well.  He denies any chest pain or pressure, SOB, DOE, PND, orthopnea, LE edema, dizziness, palpitations or syncope. He has problems with frequent falls with his Parkinson's.  He is compliant with his meds and is tolerating meds with no SE.    Past Medical History:  Diagnosis Date  . Allergic rhinitis   . Allergy   . Arthritis   . At risk for falls   . Cancer ([place])    basal cell skin cancer  . Cataract   . Complication of anesthesia    Slow to awaken  . Constipation   . DDD (degenerative disc disease), cervical   . Dysphagia   . Edema extremities 01/13/2016  . GERD (gastroesophageal reflux disease)    Decdreased, has decreased coffee intack.  . Glaucoma   . HTN (hypertension)   . Hx of adenomatous colonic polyps 01/23/2016  . Hyperlipidemia   . Hypogonadism male   . Hypothyroidism   . OSA (obstructive sleep apnea)    Has had surgery x 2  . Parkinsonism ([place]) 01/16/2013   no tremors, balance issues only , speech and swallowing difficulties   . Pre-diabetes   . Sleep apnea    surgery x 2- no cpap  . Staph infection 10-2014  . Thyroid disease    HYPOTHYROID    Past Surgical History:  Procedure Laterality Date  . CATARACT EXTRACTION    . COLONOSCOPY  04-27-2004   tics,  hems   . I & D EXTREMITY Right 10/18/2014   Procedure: IRRIGATION AND DEBRIDEMENT RIGHT KNEE PREPATELLA BURSA INFECTION;  Surgeon: [name], MD;  Location: [facility];  Service: Orthopedics;  Laterality: Right;  . [place] SURGERY  04-2015  . LUMBAR LAMINECTOMY WITH COFLEX 1 LEVEL N/A 06/06/2015   Procedure: Lumbar three-four Laminectomy/Foraminotomy with placement of Coflex;  Surgeon: [name], MD;  Location: [facility];  Service: Neurosurgery;  Laterality: N/A;  . NASAL SEPTUM SURGERY    . TONSILLECTOMY     and adenoids  . UVULOPALATOPHARYNGOPLASTY      Current Medications: Current Meds  Medication Sig  . aspirin 81 MG tablet Take 81 mg by mouth daily.  . Cholecalciferol (VITAMIN D) 2000 units CAPS Take 1 capsule by mouth once a week.   . clopidogrel (PLAVIX) 75 MG tablet Take 75 mg by mouth daily.  . fexofenadine (ALLEGRA) 60 MG tablet Take 60 mg by mouth daily.   . finasteride (PROSCAR) 5 MG tablet Take 5 mg by mouth daily.  . fluticasone (FLONASE) 50 MCG/ACT nasal spray SPRAY 1 SPRAY INTO EACH NOSTRIL EVERY DAY  . levothyroxine (SYNTHROID) 50 MCG tablet TAKE 1 TABLET BY MOUTH EVERY DAY  .  Multiple Vitamins-Minerals (MULTIVITAMIN WITH MINERALS) tablet Take 1 tablet by mouth daily.    . Naproxen Sodium (ALEVE) 220 MG CAPS Take by mouth.  . polyethylene glycol (MIRALAX / GLYCOLAX) 17 g packet Take 17 g by mouth daily.  . rosuvastatin (CRESTOR) 5 MG tablet Take 1 tablet (5 mg total) by mouth daily.  . timolol (BETIMOL) 0.5 % ophthalmic solution Place 1 drop into both eyes 2 (two) times daily.      Allergies:   Ambien [zolpidem tartrate], Atenolol, Lunesta [eszopiclone], Oxazepam, Requip [ropinirole hcl], and Ziac [bisoprolol-hydrochlorothiazide]   Social History   Socioeconomic History  . Marital status: Married    Spouse name: linda  . Number of children: 2  . Years of education: college  . Highest education level: Not on file  Occupational History  . Occupation:  branch Scientist, product/process development: RETIRED  Tobacco Use  . Smoking status: Never Smoker  . Smokeless tobacco: Never Used  Vaping Use  . Vaping Use: Never used  Substance and Sexual Activity  . Alcohol use: No    Alcohol/week: 0.0 standard drinks  . Drug use: No  . Sexual activity: Not on file  Other Topics Concern  . Not on file  Social History Narrative   3 Grandchildren    Social Determinants of Health   Financial Resource Strain:   . Difficulty of Paying Living Expenses: Not on file  Food Insecurity:   . Worried About Charity fundraiser in the Last Year: Not on file  . Ran Out of Food in the Last Year: Not on file  Transportation Needs:   . Lack of Transportation (Medical): Not on file  . Lack of Transportation (Non-Medical): Not on file  Physical Activity:   . Days of Exercise per Week: Not on file  . Minutes of Exercise per Session: Not on file  Stress:   . Feeling of Stress : Not on file  Social Connections:   . Frequency of Communication with Friends and Family: Not on file  . Frequency of Social Gatherings with Friends and Family: Not on file  . Attends Religious Services: Not on file  . Active Member of Clubs or Organizations: Not on file  . Attends Archivist Meetings: Not on file  . Marital Status: Not on file     Family History: The patient's family history includes COPD in his mother and sister; Cancer in his father; Colon polyps in his brother and father; Diabetes in his father; Emphysema in his mother; Heart attack in his sister; Heart disease in his mother and sister; Hypertension in his father, mother, and sister; Liver cancer in his father. There is no history of Stroke or Colon cancer.  ROS:   Please see the history of present illness.    ROS  All other systems reviewed and negative.   EKGs/Labs/Other Studies Reviewed:    The following studies were reviewed today: none  EKG:  EKG is not  ordered today.    Recent  Labs: 09/25/2019: ALT 20; TSH 2.42 01/19/2020: BUN 6; Creatinine, Ser 0.73; Hemoglobin 16.3; Platelets 206; Potassium 4.9; Sodium 141   Recent Lipid Panel    Component Value Date/Time   CHOL 158 09/25/2019 1556   TRIG 139 09/25/2019 1556   HDL 38 (L) 09/25/2019 1556   CHOLHDL 4.2 09/25/2019 1556   VLDL 47.0 (H) 02/18/2018 1100   LDLCALC 96 09/25/2019 1556   LDLDIRECT 107.0 02/18/2018 1100    Physical Exam:  VS:  BP (!) 110/54   Pulse (!) 53   Ht 5\' 10"  (1.778 m)   Wt 174 lb 6.4 oz (79.1 kg)   SpO2 97%   BMI 25.02 kg/m     Wt Readings from Last 3 Encounters:  07/11/20 174 lb 6.4 oz (79.1 kg)  06/08/20 177 lb 3.2 oz (80.4 kg)  03/28/20 182 lb (82.6 kg)    Orthostatic VS for the past 24 hrs (Last 3 readings):  BP- Lying Pulse- Lying BP- Sitting Pulse- Sitting BP- Standing at 0 minutes Pulse- Standing at 0 minutes BP- Standing at 3 minutes Pulse- Standing at 3 minutes  07/11/20 1434 119/71 (!) 49 109/67 55 103/63 63 110/66 60    GEN:  Well nourished, well developed in no acute distress HEENT: Normal NECK: No JVD; No carotid bruits LYMPHATICS: No lymphadenopathy CARDIAC: RRR, no murmurs, rubs, gallops RESPIRATORY:  Clear to auscultation without rales, wheezing or rhonchi  ABDOMEN: Soft, non-tender, non-distended MUSCULOSKELETAL:  No edema; No deformity  SKIN: Warm and dry NEUROLOGIC:  Alert and oriented x 3 PSYCHIATRIC:  Normal affect   ASSESSMENT:    1. HOCM (hypertrophic obstructive cardiomyopathy) ([place])   2. Essential hypertension   3. Edema leg    PLAN:    In order of problems listed above:  1.  HOCM  - ? Dx >> echo 2018 showed moderate BSH with normal LVF and no LVOT obstruction or SAM.   -He has not had a cardiac MRI but given advanced Parkinson's would not pursue Cardiac MRI at this time.  -repeat 2D echo 9/21 showed moderate BSH with normal LVF and no LVOT obstruction or SAM -He has not had any syncopal episodes.  -He has no SOB or chest pain  2.   Autonomic dysregulation -BPs have been soft recently likely related to autonomic dysregulation from his Parkinsons -orthostatic BPs in office today show a 70mmHg drop from supine to standing. -I will change his knee high to thigh high compression hose and add an abdominal binder during the day -he will let me know if he has any dizziness or syncope  -followup 6 months  3.  Chronic LE edema  - this is well controlled with compression hose.     Medication Adjustments/Labs and Tests Ordered: Current medicines are reviewed at length with the patient today.  Concerns regarding medicines are outlined above.  No orders of the defined types were placed in this encounter.  No orders of the defined types were placed in this encounter.   Signed, [name], MD  07/11/2020 2:49 PM    [place]

## 2020-07-11 NOTE — Patient Instructions (Signed)
Medication Instructions:  Your physician recommends that you continue on your current medications as directed. Please refer to the Current Medication list given to you today.  *If you need a refill on your cardiac medications before your next appointment, please call your pharmacy*  Follow-Up: At [facility], you and your health needs are our priority.  As part of our continuing mission to provide you with exceptional heart care, we have created designated Provider Care Teams.  These Care Teams include your primary Cardiologist (physician) and Advanced Practice Providers (APPs -  Physician Assistants and Nurse Practitioners) who all work together to provide you with the care you need, when you need it.  Your next appointment:   6 month(s)  The format for your next appointment:   In Person  Provider:   You may see [name], MD or one of the following Advanced Practice Providers on your designated Care Team:    Melina Copa, PA-C  Ermalinda Barrios, PA-C   Other Instructions Dr. [name] recommends that you get an abdominal binder and thigh high compression hose.

## 2020-07-12 DIAGNOSIS — Z23 Encounter for immunization: Secondary | ICD-10-CM | POA: Diagnosis not present

## 2020-08-01 ENCOUNTER — Other Ambulatory Visit: Payer: Self-pay | Admitting: Family Medicine

## 2020-09-23 ENCOUNTER — Other Ambulatory Visit: Payer: Self-pay

## 2020-09-23 ENCOUNTER — Encounter: Payer: Self-pay | Admitting: Family Medicine

## 2020-09-23 ENCOUNTER — Ambulatory Visit (INDEPENDENT_AMBULATORY_CARE_PROVIDER_SITE_OTHER): Payer: Medicare Other | Admitting: Family Medicine

## 2020-09-23 VITALS — BP 110/58 | HR 57 | Temp 97.7°F | Ht 72.0 in | Wt 172.6 lb

## 2020-09-23 DIAGNOSIS — G20 Parkinson's disease: Secondary | ICD-10-CM

## 2020-09-23 DIAGNOSIS — I10 Essential (primary) hypertension: Secondary | ICD-10-CM

## 2020-09-23 DIAGNOSIS — G909 Disorder of the autonomic nervous system, unspecified: Secondary | ICD-10-CM

## 2020-09-23 DIAGNOSIS — G609 Hereditary and idiopathic neuropathy, unspecified: Secondary | ICD-10-CM

## 2020-09-23 DIAGNOSIS — G231 Progressive supranuclear ophthalmoplegia [Steele-Richardson-Olszewski]: Secondary | ICD-10-CM | POA: Diagnosis not present

## 2020-09-23 DIAGNOSIS — N401 Enlarged prostate with lower urinary tract symptoms: Secondary | ICD-10-CM | POA: Insufficient documentation

## 2020-09-23 DIAGNOSIS — I421 Obstructive hypertrophic cardiomyopathy: Secondary | ICD-10-CM | POA: Diagnosis not present

## 2020-09-23 DIAGNOSIS — E782 Mixed hyperlipidemia: Secondary | ICD-10-CM

## 2020-09-23 DIAGNOSIS — E039 Hypothyroidism, unspecified: Secondary | ICD-10-CM

## 2020-09-23 DIAGNOSIS — G901 Familial dysautonomia [Riley-Day]: Secondary | ICD-10-CM | POA: Insufficient documentation

## 2020-09-23 NOTE — Progress Notes (Signed)
Subjective  Chief Complaint  Patient presents with  . Annual Exam    Non-fasting    HPI: [name] is a 74 y.o. male who presents to [place] at [place] today for a Male Wellness Visit. He also has the concerns and/or needs as listed above in the chief complaint. These will be addressed in addition to the Health Maintenance Visit.   Wellness Visit: annual visit with health maintenance review and exam    Health maintenance: Screens are up-to-date.  Immunizations are up-to-date. Lifestyle: Body mass index is 23.41 kg/m. Wt Readings from Last 3 Encounters:  09/23/20 172 lb 9.6 oz (78.3 kg)  07/11/20 174 lb 6.4 oz (79.1 kg)  06/08/20 177 lb 3.2 oz (80.4 kg)     Chronic disease management visit and/or acute problem visit:  Progressive supranuclear palsy and Parkinson's disease: Monitored by neurology.  Reviewed those notes.  Continues to be high fall risk with intermittent falls.  Poor balance.  Dysphagia and high aspiration risk and dysphagia diet.  No fevers, chills or productive cough.  Mental status is stable.  Wife is his primary care giver.  Autonomic dysfunction, secondary due to neurology dysfunction.  No orthostatic symptoms currently.  History of hypertension, stopped ARB in September.  No longer feeling woozy.  Blood pressures continue to run low.  Echocardiogram was unremarkable, no significant changes.  Had recent follow-up with cardiology.  No new recommendations made.  BPH on finasteride due for recheck.  Reportedly stable urinary stream.  Hyperlipidemia: Restart statin and he is tolerating this well.  Due for lipid recheck.  And liver function test.  No myalgias reported.  Hypothyroidism on 50 mcg daily.  Due for recheck.  Clinically stable  Patient Active Problem List   Diagnosis Date Noted  . Hypothyroidism 02/18/2018  . Peripheral neuropathy 11/14/2016  . PSP (progressive supranuclear palsy) ([place]) 11/08/2016  . Lumbar stenosis 06/06/2015   . HOCM (hypertrophic obstructive cardiomyopathy) ([place]) 12/12/2014  . Mixed hyperlipidemia 08/30/2013  . Parkinsonism ([place]) 01/16/2013  . History of hypertension 06/01/2008  . Tubular adenoma of colon 01/23/2016  . GLAUCOMA 06/01/2008  . Testosterone deficiency 11/12/2013  . 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 Seasonal and perennial allergic rhinitis 06/01/2008  . Benign prostatic hyperplasia with lower urinary tract symptoms 09/23/2020  . Autonomic dysfunction 09/23/2020   Health Maintenance  Topic Date Due  . URINE MICROALBUMIN  08/31/2014  . TETANUS/TDAP  07/13/2029  . INFLUENZA VACCINE  Completed  . COVID-19 Vaccine  Completed  . Hepatitis C Screening  Completed  . PNA vac Low Risk Adult  Completed   Immunization History  Administered Date(s) Administered  . Fluad Quad(high Dose 65+) 07/08/2019, 07/12/2020  . Influenza Whole 08/29/2010, 07/09/2011, 07/21/2012  . Influenza, High Dose Seasonal PF 07/24/2013, 07/15/2015, 06/23/2016, 07/04/2017  . Influenza-Unspecified 07/23/2014, 07/04/2017, 07/08/2018  . Moderna Sars-Covid-2 Vaccination 11/23/2019, 12/22/2019  . PFIZER SARS-COV-2 Vaccination 08/03/2020  . Pneumococcal Conjugate-13 05/13/2014  . Pneumococcal Polysaccharide-23 10/08/2002, 08/12/2015  . Tdap 07/08/2010, 07/14/2019  . Zoster 07/09/2007   We updated and reviewed the patient's past history in detail and it is documented below. Allergies: Patient is allergic to [employer] tartrate], atenolol, lunesta [eszopiclone], oxazepam, requip [ropinirole hcl], and ziac [bisoprolol-hydrochlorothiazide]. Past Medical History  has a past medical history of Allergic rhinitis, Allergy, Arthritis, At risk for falls, Cancer ([place]), Cataract, Complication of anesthesia, Constipation, DDD (degenerative disc disease), cervical, Dysphagia, GERD (gastroesophageal reflux disease), Glaucoma, HTN (hypertension), adenomatous colonic polyps (01/23/2016), Hyperlipidemia, Hypogonadism  male, Hypothyroidism, OSA (obstructive sleep apnea), Parkinsonism ([place]) (01/16/2013), Sleep apnea, Staph infection (10-2014), and Thyroid disease. Past Surgical History Patient  has a past surgical history that includes Uvulopalatopharyngoplasty; Lumbar disc surgery (04-2015); Cataract extraction; I & D extremity (Right, 10/18/2014); Tonsillectomy; Nasal septum surgery; Lumbar laminectomy with coflex 1 level (N/A, 06/06/2015); and Colonoscopy (04-27-2004). Social History Patient  reports that he has never smoked. He has never used smokeless tobacco. He reports that he does not drink alcohol and does not use drugs. Family History family history includes COPD in his mother and sister; Cancer in his father; Colon polyps in his brother and father; Diabetes in his father; Emphysema in his mother; Heart attack in his sister; Heart disease in his mother and sister; Hypertension in his father, mother, and sister; Liver cancer in his father. Review of Systems: Constitutional: negative for fever or malaise Ophthalmic: negative for photophobia, double vision or loss of vision Cardiovascular: negative for chest pain, dyspnea on exertion, or new LE swelling Respiratory: negative for SOB or persistent cough Gastrointestinal: negative for abdominal pain, change in bowel habits or melena Genitourinary: negative for dysuria or gross hematuria Musculoskeletal: negative for new gait disturbance or muscular weakness Integumentary: negative for new or persistent rashes Neurological: negative for TIA or stroke symptoms Psychiatric: negative for SI or delusions Allergic/Immunologic: negative for hives  Patient Care Team    Relationship Specialty Notifications Start End  [name], MD PCP - General Family Medicine  09/25/19   Sueanne Margarita, MD PCP - Cardiology Cardiology Admissions 05/06/18   Bjorn Loser, MD Consulting Physician Urology  09/25/19   Shon Hough, MD Consulting Physician Ophthalmology   09/25/19   Star Age, MD Attending Physician Neurology  09/25/19    Objective  Vitals: BP (!) 110/58   Pulse (!) 57   Temp 97.7 F (36.5 C) (Temporal)   Ht 6' (1.829 m)   Wt 172 lb 9.6 oz (78.3 kg)   SpO2 96%   BMI 23.41 kg/m  General:  Well developed, well nourished, no acute distress , weak cough Psych:  Alert and orientedx3, flat affect, masked facie HEENT:  Normocephalic, atraumatic, non-icteric sclera, PERRL, oropharynx is clear without mass or exudate, supple neck without adenopathy, mass or thyromegaly Cardiovascular:  Normal S1, S2, RRR without gallop, rub or murmur, . Respiratory:  Good breath sounds bilaterally, rhonchi present at left base Gastrointestinal: normal bowel sounds, soft, non-tender, no noted masses. No HSM Skin:  Warm, no rashes or suspicious lesions noted    Assessment  1. Essential hypertension   2. Parkinson's disease ([place])   3. PSP (progressive supranuclear palsy) ([place])   4. HOCM (hypertrophic obstructive cardiomyopathy) ([place])   5. Mixed hyperlipidemia   6. Idiopathic peripheral neuropathy   7. Acquired hypothyroidism   8. Autonomic dysfunction   9. Benign prostatic hyperplasia with lower urinary tract symptoms, symptom details unspecified      Plan  Male Wellness Visit:  Age appropriate Health Maintenance and Prevention measures were discussed with patient. Included topics are cancer screening recommendations, ways to keep healthy (see AVS) including dietary and exercise recommendations, regular eye and dental care, use of seat belts, and avoidance of moderate alcohol use and tobacco use.   BMI: discussed patient's BMI and encouraged positive lifestyle modifications to help get to or maintain a target BMI.  HM needs and immunizations were addressed and ordered. See below for orders. See HM and immunization section for updates.  Routine labs and screening tests ordered including cmp, cbc  and lipids where appropriate.  Discussed  recommendations regarding Vit D and calcium supplementation (see AVS)  Chronic disease f/u and/or acute problem visit: (deemed necessary to be done in addition to the wellness visit):  History of hypertension now with orthostatic hypotension due to autonomic dysfunction.  Continue prevention measures.  Compression stockings, high salt diet, stay hydrated, uses a walker.  [place]: Stable per cardiology  Mixed hyperlipidemia: Recheck today.  On statin.  Tolerating well.  Hypothyroidism due for recheck today.  BPH on finasteride: Due for PSA check.  This medical condition is well controlled. There are no signs of complications, medication side effects, or red flags. Patient is instructed to continue the current treatment plan without change in therapies or medications.  Parkinson's disease per neuro  Follow up: 6 months for follow-up chronic medical problems  Commons side effects, risks, benefits, and alternatives for medications and treatment plan prescribed today were discussed, and the patient expressed understanding of the given instructions. Patient is instructed to call or message via MyChart if he/she has any questions or concerns regarding our treatment plan. No barriers to understanding were identified. We discussed Red Flag symptoms and signs in detail. Patient expressed understanding regarding what to do in case of urgent or emergency type symptoms.   Medication list was reconciled, printed and provided to the patient in AVS. Patient instructions and summary information was reviewed with the patient as documented in the AVS. This note was prepared with assistance of Dragon voice recognition software. Occasional wrong-word or sound-a-like substitutions may have occurred due to the inherent limitations of voice recognition software  This visit occurred during the SARS-CoV-2 public health emergency.  Safety protocols were in place, including screening questions prior to the visit,  additional usage of staff PPE, and extensive cleaning of exam room while observing appropriate contact time as indicated for disinfecting solutions.   Orders Placed This Encounter  Procedures  . COMPLETE METABOLIC PANEL WITH GFR  . CBC with Differential/Platelet  . Lipid panel  . TSH  . PSA   No orders of the defined types were placed in this encounter.

## 2020-09-23 NOTE — Patient Instructions (Signed)
Please return in 6 months for recheck.   Consider purchasing an incentive spirometer from the medical supply store to help his chest wall strength and lungs.  Call me if he has a change in his cough or signs of illness.   I will release your lab results to you on your MyChart account with further instructions. Please reply with any questions.   If you have any questions or concerns, please don't hesitate to send me a message via MyChart or call the office at [phone]. Thank you for visiting with [place] today! It's our pleasure caring for you.

## 2020-09-24 LAB — CBC WITH DIFFERENTIAL/PLATELET
Absolute Monocytes: 548 cells/uL (ref 200–950)
Basophils Absolute: 40 cells/uL (ref 0–200)
Basophils Relative: 0.6 %
Eosinophils Absolute: 673 cells/uL — ABNORMAL HIGH (ref 15–500)
Eosinophils Relative: 10.2 %
HCT: 46.6 % (ref 38.5–50.0)
Hemoglobin: 15.9 g/dL (ref 13.2–17.1)
Lymphs Abs: 1802 cells/uL (ref 850–3900)
MCH: 31.6 pg (ref 27.0–33.0)
MCHC: 34.1 g/dL (ref 32.0–36.0)
MCV: 92.6 fL (ref 80.0–100.0)
MPV: 8.6 fL (ref 7.5–12.5)
Monocytes Relative: 8.3 %
Neutro Abs: 3538 cells/uL (ref 1500–7800)
Neutrophils Relative %: 53.6 %
Platelets: 204 10*3/uL (ref 140–400)
RBC: 5.03 10*6/uL (ref 4.20–5.80)
RDW: 12.5 % (ref 11.0–15.0)
Total Lymphocyte: 27.3 %
WBC: 6.6 10*3/uL (ref 3.8–10.8)

## 2020-09-24 LAB — LIPID PANEL
Cholesterol: 124 mg/dL (ref <200)
HDL: 41 mg/dL (ref >=40)
LDL Cholesterol (Calc): 57 mg/dL (calc)
Non-HDL Cholesterol (Calc): 83 mg/dL (calc) (ref <130)
Total CHOL/HDL Ratio: 3 (calc) (ref <5.0)
Triglycerides: 189 mg/dL — ABNORMAL HIGH (ref <150)

## 2020-09-24 LAB — COMPLETE METABOLIC PANEL WITH GFR
AG Ratio: 1.7 (calc) (ref 1.0–2.5)
ALT: 26 U/L (ref 9–46)
AST: 24 U/L (ref 10–35)
Albumin: 4.4 g/dL (ref 3.6–5.1)
Alkaline phosphatase (APISO): 57 U/L (ref 35–144)
BUN/Creatinine Ratio: 16 (calc) (ref 6–22)
BUN: 11 mg/dL (ref 7–25)
CO2: 28 mmol/L (ref 20–32)
Calcium: 9.6 mg/dL (ref 8.6–10.3)
Chloride: 104 mmol/L (ref 98–110)
Creat: 0.68 mg/dL — ABNORMAL LOW (ref 0.70–1.18)
GFR, Est African American: 110 mL/min/{1.73_m2} (ref >=60)
GFR, Est Non African American: 95 mL/min/{1.73_m2} (ref >=60)
Globulin: 2.6 g/dL (calc) (ref 1.9–3.7)
Glucose, Bld: 75 mg/dL (ref 65–99)
Potassium: 5.1 mmol/L (ref 3.5–5.3)
Sodium: 140 mmol/L (ref 135–146)
Total Bilirubin: 0.6 mg/dL (ref 0.2–1.2)
Total Protein: 7 g/dL (ref 6.1–8.1)

## 2020-09-24 LAB — PSA: PSA: 0.84 ng/mL (ref <=4.0)

## 2020-09-24 LAB — TSH: TSH: 1.85 mIU/L (ref 0.40–4.50)

## 2020-09-26 ENCOUNTER — Other Ambulatory Visit: Payer: Self-pay | Admitting: Family Medicine

## 2020-09-28 DIAGNOSIS — H401132 Primary open-angle glaucoma, bilateral, moderate stage: Secondary | ICD-10-CM | POA: Diagnosis not present

## 2020-09-28 DIAGNOSIS — Z961 Presence of intraocular lens: Secondary | ICD-10-CM | POA: Diagnosis not present

## 2020-09-28 DIAGNOSIS — H04123 Dry eye syndrome of bilateral lacrimal glands: Secondary | ICD-10-CM | POA: Diagnosis not present

## 2020-09-28 DIAGNOSIS — H16103 Unspecified superficial keratitis, bilateral: Secondary | ICD-10-CM | POA: Diagnosis not present

## 2020-11-17 ENCOUNTER — Telehealth: Payer: Self-pay | Admitting: Cardiology

## 2020-11-17 NOTE — Telephone Encounter (Signed)
Pt c/o medication issue:  1. Name of Medication: Clopidogrel 75 mg   2. How are you currently taking this medication (dosage and times per day)? Pt is NOT taking   3. Are you having a reaction (difficulty breathing--STAT)?   4. What is your medication issue? Wife of patient called. She was going through his AVS from his visit 07/11/20 with DR. Turner and this medication was on his med list. The wife states the patient has never taken this medication before. Please update in system

## 2020-11-17 NOTE — Telephone Encounter (Signed)
Spoke with the patient's wife who states that Plavix was listed on the patient's AVS after his visit with Dr. [name] last year. She states that the patient was never on this medication. It looks like it was added inadvertently as I do not see any indication that the patient should be on Plavix. Medication was removed from his last when he saw his PCP 09/23/20 and remains off of his list.

## 2020-11-29 ENCOUNTER — Ambulatory Visit (INDEPENDENT_AMBULATORY_CARE_PROVIDER_SITE_OTHER): Payer: Medicare Other | Admitting: Neurology

## 2020-11-29 ENCOUNTER — Encounter: Payer: Self-pay | Admitting: Neurology

## 2020-11-29 VITALS — BP 118/84 | HR 55 | Ht 72.0 in | Wt 177.0 lb

## 2020-11-29 DIAGNOSIS — Z9181 History of falling: Secondary | ICD-10-CM | POA: Diagnosis not present

## 2020-11-29 DIAGNOSIS — G231 Progressive supranuclear ophthalmoplegia [Steele-Richardson-Olszewski]: Secondary | ICD-10-CM | POA: Diagnosis not present

## 2020-11-29 DIAGNOSIS — R1310 Dysphagia, unspecified: Secondary | ICD-10-CM

## 2020-11-29 NOTE — Patient Instructions (Addendum)
It was good to see you again today.  Your exam is fairly stable.  Given your difficulty swallowing, I would favor that you forego botulinum toxin injections for your drooling.  You had not noticed any sustained benefit from the injections. Please continue to try to hydrate well with nectar thick liquids, eat pured food, supplementing with Ensure as you have.  Use your walker at all times, fall prevention is of great importance, and continue to monitor for constipation, use your MiraLAX for constipation management.  Follow-up routinely in 6 months, sooner if needed.
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
## 2020-11-30 ENCOUNTER — Telehealth: Payer: Self-pay | Admitting: Neurology

## 2020-11-30 NOTE — Telephone Encounter (Signed)
Pt's wife called stating that she was looking at yesterday's after visit summery and noticed that the weight was put in incorrectly. Pt's weight should be noted as 177lb

## 2020-11-30 NOTE — Telephone Encounter (Signed)
Noted, adjustment made to the note.

## 2020-12-22 ENCOUNTER — Other Ambulatory Visit: Payer: Self-pay | Admitting: Family Medicine

## 2020-12-22 DIAGNOSIS — J3089 Other allergic rhinitis: Secondary | ICD-10-CM

## 2020-12-22 DIAGNOSIS — J302 Other seasonal allergic rhinitis: Secondary | ICD-10-CM

## 2021-01-23 ENCOUNTER — Ambulatory Visit (INDEPENDENT_AMBULATORY_CARE_PROVIDER_SITE_OTHER): Payer: Medicare Other | Admitting: Cardiology

## 2021-01-23 ENCOUNTER — Other Ambulatory Visit: Payer: Self-pay

## 2021-01-23 ENCOUNTER — Encounter: Payer: Self-pay | Admitting: Cardiology

## 2021-01-23 VITALS — BP 114/60 | HR 51 | Ht 69.0 in | Wt 183.0 lb

## 2021-01-23 DIAGNOSIS — I421 Obstructive hypertrophic cardiomyopathy: Secondary | ICD-10-CM

## 2021-01-23 DIAGNOSIS — R600 Localized edema: Secondary | ICD-10-CM

## 2021-01-23 DIAGNOSIS — G901 Familial dysautonomia [Riley-Day]: Secondary | ICD-10-CM | POA: Diagnosis not present

## 2021-01-23 DIAGNOSIS — I10 Essential (primary) hypertension: Secondary | ICD-10-CM

## 2021-01-23 NOTE — Progress Notes (Signed)
Cardiology Office Note:    Date:  01/23/2021   ID:  [name], DOB 12-Sep-1946, MRN [id number]  PCP:  [name], MD  Cardiologist:  [name], MD    Referring MD: [name], MD   Chief Complaint  Patient presents with  . Sleep Apnea  . Hypertension  . Follow-up    HOCM, HTN, dysautonomia, LE edema    History of Present Illness:    [name] is a 75 y.o. male with a hx of HTN, OSA, Parkinson's, hyperlipidemia, pre- diabetes, asymmetric septal hypertrophy c/w HOCM with no outflow tact gradientand hypothyroidism. He was seen by Neuro at [facility], his Losartan was stopped due to soft BPs.  Recent 2D echo showed moderately BSH at 61mm with no SAM and no LVOT obstruction and trivial MR.  He is here today for followup and is doing well.  He denies any chest pain or pressure, SOB, DOE, PND, orthopnea, LE edema, dizziness, palpitations or syncope. He has problems with frequent falls with his Parkinson's.  He is compliant with his meds and is tolerating meds with no SE.    Past Medical History:  Diagnosis Date  . Allergic rhinitis   . Allergy   . Arthritis   . At risk for falls   . Cancer ([place])    basal cell skin cancer  . Cataract   . Complication of anesthesia    Slow to awaken  . Constipation   . DDD (degenerative disc disease), cervical   . Dysphagia   . GERD (gastroesophageal reflux disease)    Decdreased, has decreased coffee intack.  . Glaucoma   . HTN (hypertension)   . Hx of adenomatous colonic polyps 01/23/2016  . Hyperlipidemia   . Hypogonadism male   . Hypothyroidism   . OSA (obstructive sleep apnea)    Has had surgery x 2  . Parkinsonism ([place]) 01/16/2013   no tremors, balance issues only , speech and swallowing difficulties   . Sleep apnea    surgery x 2- no cpap  . Staph infection 10-2014  . Thyroid disease    HYPOTHYROID    Past Surgical History:  Procedure Laterality Date  . CATARACT EXTRACTION    . COLONOSCOPY  04-27-2004   tics, hems    . I & D EXTREMITY Right 10/18/2014   Procedure: IRRIGATION AND DEBRIDEMENT RIGHT KNEE PREPATELLA BURSA INFECTION;  Surgeon: [name], MD;  Location: [place];  Service: Orthopedics;  Laterality: Right;  . [place] SURGERY  04-2015  . LUMBAR LAMINECTOMY WITH COFLEX 1 LEVEL N/A 06/06/2015   Procedure: Lumbar three-four Laminectomy/Foraminotomy with placement of Coflex;  Surgeon: [name], MD;  Location: [place] NEURO ORS;  Service: Neurosurgery;  Laterality: N/A;  . NASAL SEPTUM SURGERY    . TONSILLECTOMY     and adenoids  . UVULOPALATOPHARYNGOPLASTY      Current Medications: Current Meds  Medication Sig  . aspirin 81 MG tablet Take 81 mg by mouth daily.  . Cholecalciferol (VITAMIN D) 2000 units CAPS Take 1 capsule by mouth once a week.   . fexofenadine (ALLEGRA) 60 MG tablet Take 60 mg by mouth daily.   . finasteride (PROSCAR) 5 MG tablet Take 5 mg by mouth daily.  . fluticasone (FLONASE) 50 MCG/ACT nasal spray SPRAY 1 SPRAY INTO EACH NOSTRIL EVERY DAY  . levothyroxine (SYNTHROID) 50 MCG tablet TAKE 1 TABLET BY MOUTH EVERY DAY  . Multiple Vitamins-Minerals (MULTIVITAMIN WITH MINERALS) tablet Take 1 tablet by mouth daily.  Marland Kitchen  Naproxen Sodium 220 MG CAPS Take by mouth.  . polyethylene glycol (MIRALAX / GLYCOLAX) 17 g packet Take 17 g by mouth daily.  . rosuvastatin (CRESTOR) 5 MG tablet Take 1 tablet (5 mg total) by mouth daily.  . timolol (BETIMOL) 0.5 % ophthalmic solution Place 1 drop into both eyes 2 (two) times daily.      Allergies:   Ambien [zolpidem tartrate], Atenolol, Lunesta [eszopiclone], Oxazepam, Requip [ropinirole hcl], and Ziac [bisoprolol-hydrochlorothiazide]   Social History   Socioeconomic History  . Marital status: Married    Spouse name: linda  . Number of children: 2  . Years of education: college  . Highest education level: Not on file  Occupational History  . Occupation: branch Scientist, product/process development: RETIRED  Tobacco Use  . Smoking status:  Never Smoker  . Smokeless tobacco: Never Used  Vaping Use  . Vaping Use: Never used  Substance and Sexual Activity  . Alcohol use: No    Alcohol/week: 0.0 standard drinks  . Drug use: No  . Sexual activity: Not on file  Other Topics Concern  . Not on file  Social History Narrative   3 Grandchildren    Social Determinants of Health   Financial Resource Strain: Not on file  Food Insecurity: Not on file  Transportation Needs: Not on file  Physical Activity: Not on file  Stress: Not on file  Social Connections: Not on file     Family History: The patient's family history includes COPD in his mother and sister; Cancer in his father; Colon polyps in his brother and father; Diabetes in his father; Emphysema in his mother; Heart attack in his sister; Heart disease in his mother and sister; Hypertension in his father, mother, and sister; Liver cancer in his father. There is no history of Stroke or Colon cancer.  ROS:   Please see the history of present illness.    ROS  All other systems reviewed and negative.   EKGs/Labs/Other Studies Reviewed:    The following studies were reviewed today: none  EKG:  EKG is not  ordered today.    Recent Labs: 09/23/2020: ALT 26; BUN 11; Creat 0.68; Hemoglobin 15.9; Platelets 204; Potassium 5.1; Sodium 140; TSH 1.85   Recent Lipid Panel    Component Value Date/Time   CHOL 124 09/23/2020 1356   TRIG 189 (H) 09/23/2020 1356   HDL 41 09/23/2020 1356   CHOLHDL 3.0 09/23/2020 1356   VLDL 47.0 (H) 02/18/2018 1100   LDLCALC 57 09/23/2020 1356   LDLDIRECT 107.0 02/18/2018 1100    Physical Exam:    VS:  BP 114/60   Pulse (!) 51   Ht 5\' 9"  (1.753 m)   Wt 183 lb (83 kg)   BMI 27.02 kg/m     Wt Readings from Last 3 Encounters:  01/23/21 183 lb (83 kg)  11/29/20 177 lb (80.3 kg)  09/23/20 172 lb 9.6 oz (78.3 kg)    No data found.  GEN: Well nourished, well developed in no acute distress HEENT: Normal NECK: No JVD; No carotid  bruits LYMPHATICS: No lymphadenopathy CARDIAC:RRR, no murmurs, rubs, gallops RESPIRATORY:  Clear to auscultation without rales, wheezing or rhonchi  ABDOMEN: Soft, non-tender, non-distended MUSCULOSKELETAL:  No edema; No deformity  SKIN: Warm and dry NEUROLOGIC:  Alert and oriented x 3 PSYCHIATRIC:  Normal affect    ASSESSMENT:    1. HOCM (hypertrophic obstructive cardiomyopathy) ([place])   2. Dysautonomia (HCC)   3. Edema leg  4. Essential hypertension    PLAN:    In order of problems listed above:  1.  HOCM  - ? Dx >> echo 2018 showed moderate BSH with normal LVF and no LVOT obstruction or SAM.   -He has not had a cardiac MRI but given advanced Parkinson's would not pursue Cardiac MRI at this time.  -repeat 2D echo 9/21 showed moderate BSH with normal LVF and no LVOT obstruction or SAM -he denies any dizziness or syncope since I saw him last -He has no SOB or chest pain  2.  Autonomic dysregulation -BPs have been soft recently likely related to autonomic dysregulation from his Parkinsons -continue thigh high compression hose daily -he will let me know if he has any dizziness or syncope   3.  Chronic LE edema  -he has no edema on exam -this is well controlled with compression hose.    4.  HTN -BP controlled on exam today -diet controlled   Medication Adjustments/Labs and Tests Ordered: Current medicines are reviewed at length with the patient today.  Concerns regarding medicines are outlined above.  Orders Placed This Encounter  Procedures  . EKG 12-Lead   No orders of the defined types were placed in this encounter.   Signed, [name], MD  01/23/2021 1:18 PM    [place]

## 2021-01-23 NOTE — Patient Instructions (Signed)

## 2021-02-01 DIAGNOSIS — N319 Neuromuscular dysfunction of bladder, unspecified: Secondary | ICD-10-CM | POA: Diagnosis not present

## 2021-02-01 DIAGNOSIS — N130 Hydronephrosis with ureteropelvic junction obstruction: Secondary | ICD-10-CM | POA: Diagnosis not present

## 2021-02-13 ENCOUNTER — Ambulatory Visit (INDEPENDENT_AMBULATORY_CARE_PROVIDER_SITE_OTHER): Payer: Medicare Other

## 2021-02-13 DIAGNOSIS — Z0000 Encounter for general adult medical examination without abnormal findings: Secondary | ICD-10-CM

## 2021-02-13 NOTE — Progress Notes (Addendum)
Virtual Visit via Telephone Note  I connected with  [name] on 02/13/21 at  1:45 PM EDT by telephone and verified that I am speaking with the correct person using two identifiers   Location: Patient: Home Provider: Office Persons participating in the virtual visit: patient/Nurse Health Advisor and wife [name]    I discussed the limitations, risks, security and privacy concerns of performing an evaluation and management service by telephone and the availability of in person appointments. The patient expressed understanding and agreed to proceed.  Interactive audio and video telecommunications were attempted between this nurse and patient, however failed, due to patient having technical difficulties OR patient did not have access to video capability.  We continued and completed visit with audio only.  Some vital signs may be absent or patient reported.   Mason Schlein, LPN    Subjective:   [name] is a 75 y.o. male who presents for Medicare Annual/Subsequent preventive examination.  Review of Systems     Cardiac Risk Factors include: advanced age (>80men, >55 women);hypertension;dyslipidemia;male gender     Objective:    There were no vitals filed for this visit. There is no height or weight on file to calculate BMI.  Advanced Directives 02/13/2021 02/15/2020 01/11/2020 01/15/2018 01/14/2017 01/09/2017 12/06/2016  Does Patient Have a Medical Advance Directive? Yes Yes Yes Yes Yes Yes Yes  Type of Estate agent of [employer] Power of [place];Living will Living will;Healthcare Power of [address] - Healthcare Power of [place];Living will Healthcare Power of [place];Living will Healthcare Power of [place];Living will  Does patient want to make changes to medical advance directive? - No - Patient declined No - Patient declined - - - -  Copy of Healthcare Power of Attorney in Chart? No - copy requested - No - copy requested - - - -    Current  Medications (verified) Outpatient Encounter Medications as of 02/13/2021  Medication Sig  . aspirin 81 MG tablet Take 81 mg by mouth daily.  . Cholecalciferol (VITAMIN D) 2000 units CAPS Take 1 capsule by mouth once a week.   . fexofenadine (ALLEGRA) 60 MG tablet Take 60 mg by mouth daily.   . finasteride (PROSCAR) 5 MG tablet Take 5 mg by mouth daily.  . fluticasone (FLONASE) 50 MCG/ACT nasal spray SPRAY 1 SPRAY INTO EACH NOSTRIL EVERY DAY  . levothyroxine (SYNTHROID) 50 MCG tablet TAKE 1 TABLET BY MOUTH EVERY DAY  . Multiple Vitamins-Minerals (MULTIVITAMIN WITH MINERALS) tablet Take 1 tablet by mouth daily.  . Naproxen Sodium 220 MG CAPS Take by mouth. Once a month prn  . polyethylene glycol (MIRALAX / GLYCOLAX) 17 g packet Take 17 g by mouth daily.  . rosuvastatin (CRESTOR) 5 MG tablet Take 1 tablet (5 mg total) by mouth daily.  . timolol (BETIMOL) 0.5 % ophthalmic solution Place 1 drop into both eyes 2 (two) times daily.    No facility-administered encounter medications on file as of 02/13/2021.    Allergies (verified) Ambien [zolpidem tartrate], Atenolol, Lunesta [eszopiclone], Oxazepam, Requip [ropinirole hcl], and Ziac [bisoprolol-hydrochlorothiazide]   History: Past Medical History:  Diagnosis Date  . Allergic rhinitis   . Allergy   . Arthritis   . At risk for falls   . Cancer (HCC)    basal cell skin cancer  . Cataract   . Complication of anesthesia    Slow to awaken  . Constipation   . DDD (degenerative disc disease), cervical   . Dysphagia   . GERD (gastroesophageal  reflux disease)    Decdreased, has decreased coffee intack.  . Glaucoma   . HTN (hypertension)   . Hx of adenomatous colonic polyps 01/23/2016  . Hyperlipidemia   . Hypogonadism male   . Hypothyroidism   . OSA (obstructive sleep apnea)    Has had surgery x 2  . Parkinsonism ([place]) 01/16/2013   no tremors, balance issues only , speech and swallowing difficulties   . Sleep apnea    surgery x 2- no cpap   . Staph infection 10-2014  . Thyroid disease    HYPOTHYROID   Past Surgical History:  Procedure Laterality Date  . CATARACT EXTRACTION    . COLONOSCOPY  04-27-2004   tics, hems   . I & D EXTREMITY Right 10/18/2014   Procedure: IRRIGATION AND DEBRIDEMENT RIGHT KNEE PREPATELLA BURSA INFECTION;  Surgeon: Mason Minion, MD;  Location: [place];  Service: Orthopedics;  Laterality: Right;  . [place] SURGERY  04-2015  . LUMBAR LAMINECTOMY WITH COFLEX 1 LEVEL N/A 06/06/2015   Procedure: Lumbar three-four Laminectomy/Foraminotomy with placement of Coflex;  Surgeon: Mason Miss, MD;  Location: [place] NEURO ORS;  Service: Neurosurgery;  Laterality: N/A;  . NASAL SEPTUM SURGERY    . TONSILLECTOMY     and adenoids  . UVULOPALATOPHARYNGOPLASTY     Family History  Problem Relation Age of Onset  . Emphysema Mother   . COPD Mother   . Heart disease Mother   . Hypertension Mother   . Liver cancer Father   . Cancer Father   . Diabetes Father   . Hypertension Father   . Colon polyps Father   . Heart disease Sister   . COPD Sister   . Heart attack Sister   . Hypertension Sister   . Colon polyps Brother   . Stroke Neg Hx   . Colon cancer Neg Hx    Social History   Socioeconomic History  . Marital status: Married    Spouse name: linda  . Number of children: 2  . Years of education: college  . Highest education level: Not on file  Occupational History  . Occupation: branch Scientist, product/process development: RETIRED  Tobacco Use  . Smoking status: Never Smoker  . Smokeless tobacco: Never Used  Vaping Use  . Vaping Use: Never used  Substance and Sexual Activity  . Alcohol use: No    Alcohol/week: 0.0 standard drinks  . Drug use: No  . Sexual activity: Not on file  Other Topics Concern  . Not on file  Social History Narrative   3 Grandchildren    Social Determinants of Health   Financial Resource Strain: Low Risk   . Difficulty of Paying Living Expenses: Not hard at all  Food  Insecurity: No Food Insecurity  . Worried About Charity fundraiser in the Last Year: Never true  . Ran Out of Food in the Last Year: Never true  Transportation Needs: No Transportation Needs  . Lack of Transportation (Medical): No  . Lack of Transportation (Non-Medical): No  Physical Activity: Insufficiently Active  . Days of Exercise per Week: 5 days  . Minutes of Exercise per Session: 20 min  Stress: No Stress Concern Present  . Feeling of Stress : Not at all  Social Connections: Socially Isolated  . Frequency of Communication with Friends and Family: Once a week  . Frequency of Social Gatherings with Friends and Family: Once a week  . Attends Religious Services: Never  . Active  Member of Clubs or Organizations: No  . Attends Banker Meetings: Never  . Marital Status: Married    Tobacco Counseling Counseling given: Not Answered   Clinical Intake:  Pre-visit preparation completed: Yes  Pain : No/denies pain     BMI - recorded: 27.02 Nutritional Status: BMI 25 -29 Overweight Nutritional Risks: None Diabetes: No  How often do you need to have someone help you when you read instructions, pamphlets, or other written materials from your doctor or pharmacy?: 1 - Never  Diabetic?No  Interpreter Needed?: No  Information entered by :: Lanier Ensign, LPN   Activities of Daily Living In your present state of health, do you have any difficulty performing the following activities: 02/13/2021 09/23/2020  Hearing? N N  Vision? N N  Difficulty concentrating or making decisions? N Y  Walking or climbing stairs? Y Y  Dressing or bathing? Malvin Johns  Comment wife assist -  Doing errands, shopping? Malvin Johns  Preparing Food and eating ? Y -  Comment wife assist -  Using the Toilet? Y -  In the past six months, have you accidently leaked urine? Y -  Comment urgency -  Do you have problems with loss of bowel control? Y -  Managing your Medications? Y -  Managing your  Finances? Y -  Housekeeping or managing your Housekeeping? Y -  Some recent data might be hidden    Patient Care Team: Willow Ora, MD as PCP - General (Family Medicine) Quintella Reichert, MD as PCP - Cardiology (Cardiology) Alfredo Martinez, MD as Consulting Physician (Urology) Mckinley Jewel, MD as Consulting Physician (Ophthalmology) Huston Foley, MD as Attending Physician (Neurology)  Indicate any recent Medical Services you may have received from other than [facility] providers in the past year (date may be approximate).     Assessment:   This is a routine wellness examination for [name].  Hearing/Vision screen  Hearing Screening   125Hz  250Hz  500Hz  1000Hz  2000Hz  3000Hz  4000Hz  6000Hz  8000Hz   Right ear:           Left ear:           Comments: Pt denies hearing issues   Vision Screening Comments: Pt follows up with Dr and Dr for annual eye exams   Dietary issues and exercise activities discussed: Current Exercise Habits: Home exercise routine, Type of exercise: stretching, Time (Minutes): 20, Frequency (Times/Week): 5, Weekly Exercise (Minutes/Week): 100  Goals Addressed            This Visit's Progress   . Patient Stated       Get rid of walker       Depression Screen PHQ 2/9 Scores 02/13/2021 01/11/2020 04/22/2019 01/15/2018 10/18/2016 09/30/2015 09/28/2014  PHQ - 2 Score 0 0 0 0 0 0 0  PHQ- 9 Score - - 0 - - - -    Fall Risk Fall Risk  02/13/2021 09/23/2020 01/11/2020 09/25/2019 04/22/2019  Falls in the past year? 1 1 1 1 1   Number falls in past yr: 1 1 1 1 1   Injury with Fall? 1 0 1 1 0  Risk Factor Category  - - - - -  Risk for fall due to : Impaired vision;Impaired balance/gait;Impaired mobility;History of fall(s) Impaired balance/gait;Impaired mobility;History of fall(s);Mental status change History of fall(s);Impaired balance/gait;Impaired mobility;Mental status change Impaired balance/gait;History of fall(s) -  Risk for fall due to: Comment - -  - - -  Follow up Falls prevention discussed - Falls evaluation  completed;Education provided;Falls prevention discussed Education provided -    FALL RISK PREVENTION PERTAINING TO THE HOME:  Any stairs in or around the home? Yes  If so, are there any without handrails? No  Home free of loose throw rugs in walkways, pet beds, electrical cords, etc? Yes  Adequate lighting in your home to reduce risk of falls? Yes   ASSISTIVE DEVICES UTILIZED TO PREVENT FALLS:  Life alert? No  Use of a cane, walker or w/c? Yes  Grab bars in the bathroom? Yes  Shower chair or bench in shower? Yes  Elevated toilet seat or a handicapped toilet? Yes   TIMED UP AND GO:   Was the test performed? No    Cognitive Function: MMSE - Mini Mental State Exam 01/15/2018 11/19/2016 04/30/2016 10/31/2015 11/18/2014  Not completed: (No Data) - - - -  Orientation to time - 5 5 5 5   Orientation to Place - 5 5 5 5   Registration - 3 3 3 3   Attention/ Calculation - 5 3 5 5   Recall - 3 3 3 3   Language- name 2 objects - 2 2 2 2   Language- repeat - 1 1 1 1   Language- follow 3 step command - 3 3 3 3   Language- read & follow direction - 1 1 1 1   Write a sentence - 1 1 1 1   Copy design - 1 1 1 1   Total score - 30 28 30 30      6CIT Screen 02/13/2021  What Year? 0 points  What month? 0 points  Count back from 20 0 points  Months in reverse 0 points  Repeat phrase 0 points    Immunizations Immunization History  Administered Date(s) Administered  . Fluad Quad(high Dose 65+) 07/08/2019, 07/12/2020  . Influenza Whole 08/29/2010, 07/09/2011, 07/21/2012  . Influenza, High Dose Seasonal PF 07/24/2013, 07/15/2015, 06/23/2016, 07/04/2017  . Influenza-Unspecified 07/23/2014, 07/04/2017, 07/08/2018  . Moderna Sars-Covid-2 Vaccination 11/23/2019, 12/22/2019  . PFIZER(Purple Top)SARS-COV-2 Vaccination 08/03/2020  . Pneumococcal Conjugate-13 05/13/2014  . Pneumococcal Polysaccharide-23 10/08/2002, 08/12/2015  . Tdap 07/08/2010,  07/14/2019  . Zoster 07/09/2007    TDAP status: Up to date  Flu Vaccine status: Up to date  Pneumococcal vaccine status: Up to date  Covid-19 vaccine status: Completed vaccines  Qualifies for Shingles Vaccine? Yes   Zostavax completed No   Shingrix Completed?: No.    Education has been provided regarding the importance of this vaccine. Patient has been advised to call insurance company to determine out of pocket expense if they have not yet received this vaccine. Advised may also receive vaccine at local pharmacy or Health Dept. Verbalized acceptance and understanding.  Screening Tests Health Maintenance  Topic Date Due  . INFLUENZA VACCINE  05/08/2021  . TETANUS/TDAP  07/13/2029  . COVID-19 Vaccine  Completed  . Hepatitis C Screening  Completed  . PNA vac Low Risk Adult  Completed  . HPV VACCINES  Aged Out    Health Maintenance  There are no preventive care reminders to display for this patient.  Colorectal cancer screening: No longer required.   Additional Screening:  Hepatitis C Screening:  Completed 09/30/15  Vision Screening: Recommended annual ophthalmology exams for early detection of glaucoma and other disorders of the eye. Is the patient up to date with their annual eye exam?  Yes  Who is the provider or what is the name of the office in which the patient attends annual eye exams? Dr Valetta Close  If pt is not established with  a provider, would they like to be referred to a provider to establish care? No .   Dental Screening: Recommended annual dental exams for proper oral hygiene  Community Resource Referral / Chronic Care Management: CRR required this visit?  No   CCM required this visit?  No      Plan:     I have personally reviewed and noted the following in the patient's chart:   . Medical and social history . Use of alcohol, tobacco or illicit drugs  . Current medications and supplements including opioid prescriptions. Patient is not currently taking  opioid prescriptions. . Functional ability and status . Nutritional status . Physical activity . Advanced directives . List of other physicians . Hospitalizations, surgeries, and ER visits in previous 12 months . Vitals . Screenings to include cognitive, depression, and falls . Referrals and appointments  In addition, I have reviewed and discussed with patient certain preventive protocols, quality metrics, and best practice recommendations. A written personalized care plan for preventive services as well as general preventive health recommendations were provided to patient.     Willette Brace, LPN   11/10/4823   Nurse Notes: None

## 2021-02-13 NOTE — Patient Instructions (Signed)
[name] , Thank you for taking time to come for your Medicare Wellness Visit. I appreciate your ongoing commitment to your health goals. Please review the following plan we discussed and let me know if I can assist you in the future.   Screening recommendations/referrals: Colonoscopy: Done 01/16/16 Recommended yearly ophthalmology/optometry visit for glaucoma screening and checkup Recommended yearly dental visit for hygiene and checkup  Vaccinations: Influenza vaccine: Up to date Pneumococcal vaccine: Up to date Tdap vaccine: Up to date Shingles vaccine: Shingrix discussed. Please contact your pharmacy for coverage information.    Covid-19: Completed 2/15, 3/16, & 08/03/20  Advanced directives: Please bring a copy of your health care power of attorney and living will to the office at your convenience.   Conditions/risks identified: None at this time   Next appointment: Follow up in one year for your annual wellness visit.  Preventive Care 74 Years and Older, Male Preventive care refers to lifestyle choices and visits with your health care provider that can promote health and wellness. What does preventive care include?  A yearly physical exam. This is also called an annual well check.  Dental exams once or twice a year.  Routine eye exams. Ask your health care provider how often you should have your eyes checked.  Personal lifestyle choices, including:  Daily care of your teeth and gums.  Regular physical activity.  Eating a healthy diet.  Avoiding tobacco and drug use.  Limiting alcohol use.  Practicing safe sex.  Taking low doses of aspirin every day.  Taking vitamin and mineral supplements as recommended by your health care provider. What happens during an annual well check? The services and screenings done by your health care provider during your annual well check will depend on your age, overall health, lifestyle risk factors, and family history of  disease. Counseling  Your health care provider may ask you questions about your:  Alcohol use.  Tobacco use.  Drug use.  Emotional well-being.  Home and relationship well-being.  Sexual activity.  Eating habits.  History of falls.  Memory and ability to understand (cognition).  Work and work Statistician. Screening  You may have the following tests or measurements:  Height, weight, and BMI.  Blood pressure.  Lipid and cholesterol levels. These may be checked every 5 years, or more frequently if you are over 56 years old.  Skin check.  Lung cancer screening. You may have this screening every year starting at age 41 if you have a 30-pack-year history of smoking and currently smoke or have quit within the past 15 years.  Fecal occult blood test (FOBT) of the stool. You may have this test every year starting at age 63.  Flexible sigmoidoscopy or colonoscopy. You may have a sigmoidoscopy every 5 years or a colonoscopy every 10 years starting at age 36.  Prostate cancer screening. Recommendations will vary depending on your family history and other risks.  Hepatitis C blood test.  Hepatitis B blood test.  Sexually transmitted disease (STD) testing.  Diabetes screening. This is done by checking your blood sugar (glucose) after you have not eaten for a while (fasting). You may have this done every 1-3 years.  Abdominal aortic aneurysm (AAA) screening. You may need this if you are a current or former smoker.  Osteoporosis. You may be screened starting at age 36 if you are at high risk. Talk with your health care provider about your test results, treatment options, and if necessary, the need for more tests. Vaccines  Your health care provider may recommend certain vaccines, such as:  Influenza vaccine. This is recommended every year.  Tetanus, diphtheria, and acellular pertussis (Tdap, Td) vaccine. You may need a Td booster every 10 years.  Zoster vaccine. You may  need this after age 54.  Pneumococcal 13-valent conjugate (PCV13) vaccine. One dose is recommended after age 45.  Pneumococcal polysaccharide (PPSV23) vaccine. One dose is recommended after age 101. Talk to your health care provider about which screenings and vaccines you need and how often you need them. This information is not intended to replace advice given to you by your health care provider. Make sure you discuss any questions you have with your health care provider. Document Released: 10/21/2015 Document Revised: 06/13/2016 Document Reviewed: 07/26/2015 Elsevier Interactive Patient Education  2017 [place] Prevention in the Home Falls can cause injuries. They can happen to people of all ages. There are many things you can do to make your home safe and to help prevent falls. What can I do on the outside of my home?  Regularly fix the edges of walkways and driveways and fix any cracks.  Remove anything that might make you trip as you walk through a door, such as a raised step or threshold.  Trim any bushes or trees on the path to your home.  Use bright outdoor lighting.  Clear any walking paths of anything that might make someone trip, such as rocks or tools.  Regularly check to see if handrails are loose or broken. Make sure that both sides of any steps have handrails.  Any raised decks and porches should have guardrails on the edges.  Have any leaves, snow, or ice cleared regularly.  Use sand or salt on walking paths during winter.  Clean up any spills in your garage right away. This includes oil or grease spills. What can I do in the bathroom?  Use night lights.  Install grab bars by the toilet and in the tub and shower. Do not use towel bars as grab bars.  Use non-skid mats or decals in the tub or shower.  If you need to sit down in the shower, use a plastic, non-slip stool.  Keep the floor dry. Clean up any water that spills on the floor as soon as it  happens.  Remove soap buildup in the tub or shower regularly.  Attach bath mats securely with double-sided non-slip rug tape.  Do not have throw rugs and other things on the floor that can make you trip. What can I do in the bedroom?  Use night lights.  Make sure that you have a light by your bed that is easy to reach.  Do not use any sheets or blankets that are too big for your bed. They should not hang down onto the floor.  Have a firm chair that has side arms. You can use this for support while you get dressed.  Do not have throw rugs and other things on the floor that can make you trip. What can I do in the kitchen?  Clean up any spills right away.  Avoid walking on wet floors.  Keep items that you use a lot in easy-to-reach places.  If you need to reach something above you, use a strong step stool that has a grab bar.  Keep electrical cords out of the way.  Do not use floor polish or wax that makes floors slippery. If you must use wax, use non-skid floor wax.  Do  not have throw rugs and other things on the floor that can make you trip. What can I do with my stairs?  Do not leave any items on the stairs.  Make sure that there are handrails on both sides of the stairs and use them. Fix handrails that are broken or loose. Make sure that handrails are as long as the stairways.  Check any carpeting to make sure that it is firmly attached to the stairs. Fix any carpet that is loose or worn.  Avoid having throw rugs at the top or bottom of the stairs. If you do have throw rugs, attach them to the floor with carpet tape.  Make sure that you have a light switch at the top of the stairs and the bottom of the stairs. If you do not have them, ask someone to add them for you. What else can I do to help prevent falls?  Wear shoes that:  Do not have high heels.  Have rubber bottoms.  Are comfortable and fit you well.  Are closed at the toe. Do not wear sandals.  If you  use a stepladder:  Make sure that it is fully opened. Do not climb a closed stepladder.  Make sure that both sides of the stepladder are locked into place.  Ask someone to hold it for you, if possible.  Clearly mark and make sure that you can see:  Any grab bars or handrails.  First and last steps.  Where the edge of each step is.  Use tools that help you move around (mobility aids) if they are needed. These include:  Canes.  Walkers.  Scooters.  Crutches.  Turn on the lights when you go into a dark area. Replace any light bulbs as soon as they burn out.  Set up your furniture so you have a clear path. Avoid moving your furniture around.  If any of your floors are uneven, fix them.  If there are any pets around you, be aware of where they are.  Review your medicines with your doctor. Some medicines can make you feel dizzy. This can increase your chance of falling. Ask your doctor what other things that you can do to help prevent falls. This information is not intended to replace advice given to you by your health care provider. Make sure you discuss any questions you have with your health care provider. Document Released: 07/21/2009 Document Revised: 03/01/2016 Document Reviewed: 10/29/2014 Elsevier Interactive Patient Education  2017 [employer].

## 2021-03-10 DIAGNOSIS — H401132 Primary open-angle glaucoma, bilateral, moderate stage: Secondary | ICD-10-CM | POA: Diagnosis not present

## 2021-03-10 DIAGNOSIS — H5201 Hypermetropia, right eye: Secondary | ICD-10-CM | POA: Diagnosis not present

## 2021-03-17 ENCOUNTER — Other Ambulatory Visit: Payer: Self-pay | Admitting: Family Medicine

## 2021-03-27 ENCOUNTER — Ambulatory Visit (INDEPENDENT_AMBULATORY_CARE_PROVIDER_SITE_OTHER): Payer: Medicare Other | Admitting: Family Medicine

## 2021-03-27 ENCOUNTER — Other Ambulatory Visit: Payer: Self-pay

## 2021-03-27 ENCOUNTER — Encounter: Payer: Self-pay | Admitting: Family Medicine

## 2021-03-27 VITALS — BP 129/75 | HR 52 | Ht 69.0 in | Wt 182.8 lb

## 2021-03-27 DIAGNOSIS — J3089 Other allergic rhinitis: Secondary | ICD-10-CM

## 2021-03-27 DIAGNOSIS — G909 Disorder of the autonomic nervous system, unspecified: Secondary | ICD-10-CM

## 2021-03-27 DIAGNOSIS — G231 Progressive supranuclear ophthalmoplegia [Steele-Richardson-Olszewski]: Secondary | ICD-10-CM

## 2021-03-27 DIAGNOSIS — K5909 Other constipation: Secondary | ICD-10-CM

## 2021-03-27 DIAGNOSIS — G20 Parkinson's disease: Secondary | ICD-10-CM | POA: Diagnosis not present

## 2021-03-27 DIAGNOSIS — G609 Hereditary and idiopathic neuropathy, unspecified: Secondary | ICD-10-CM

## 2021-03-27 DIAGNOSIS — J302 Other seasonal allergic rhinitis: Secondary | ICD-10-CM

## 2021-03-27 MED ORDER — AZELASTINE HCL 0.1 % NA SOLN: 1.0000 | Freq: Two times a day (BID) | NASAL | 11 refills | Status: DC

## 2021-03-27 NOTE — Progress Notes (Signed)
Subjective  CC:  Chief Complaint  Patient presents with   Hypertension   parkisons    Swallowing is worsening    progressive supranuclear palsy    HPI: [name] is a 75 y.o. male who presents to the office today to address the problems listed above in the chief complaint. 75 year old with Parkinson's and supranuclear palsy here for follow-up.  I reviewed recent notes with specialists including neurology and cardiology. Cardiology has been following along for possible hokum although recently this diagnosis is less clear.  He seems to be very stable.  Soft blood pressures.  No longer needing medications.  He has autonomic dysfunction but denies current symptoms of lightheadedness orthostatic hypotension.  No changes in medications were recommended. Neurology sees him every 6 months.  Continues to have progressive disease with increased falls, fortunately no injuries.  Also noticing worsening swallowing.  He is on a dysphagia diet.  Having problem with swallowing saliva, however due to worsening swallowing, injections for this with botulinum toxin have been stopped.  They also were not necessarily helpful.  He is being treated for Parkinson's.  He reports things are stable.  His wife reports things are about the same.  He is communicative but difficult to understand.  His wife helps.  He requires full supervision and care. Chronic constipation: MiraLAX daily.  Patient feels that sometimes he needs to go to the bathroom more often than he does however his wife reports soft brown almost daily stooling.  Patient denies abdominal pain. Patient complains of sinus symptoms.  He is requesting Z-Pak.  No fevers, chills, sinus pain but does suffer from allergies.  He takes Advertising account planner daily.  He does not do well with Mucinex due to his swallowing difficulties.  Assessment  1. PSP (progressive supranuclear palsy) ([place])   2. Parkinson's disease ([place])   3. Idiopathic peripheral neuropathy   4.  Seasonal and perennial allergic rhinitis   5. Autonomic dysfunction   6. Chronic constipation      Plan  Neurology: Parkinson and PSP: Progressive and managed per neurology.  Continue supportive care.  Chronic neuropathy stable.  Wearing support stockings. Low blood pressures with autonomic dysfunction: Support stockings, good hydration and fall prevention discussed. Seasonal allergies: Trial of Sudafed and Astelin for nasal symptoms.  No infection identified today.  Reassured patient.  Sudafed may help his blood pressures.  Monitor heart rate and side effects. Chronic constipation: Educated.  Continue MiraLAX daily.  Follow up: Return in about 9 months (around 12/25/2021) for complete physical.  Visit date not found  No orders of the defined types were placed in this encounter.  Meds ordered this encounter  Medications   azelastine (ASTELIN) 0.1 % nasal spray    Sig: Place 1 spray into both nostrils 2 (two) times daily.    Dispense:  30 mL    Refill:  11      I reviewed the patients updated PMH, FH, and SocHx.    Patient Active Problem List   Diagnosis Date Noted   Hypothyroidism 02/18/2018    Priority: High   Peripheral neuropathy 11/14/2016    Priority: High   PSP (progressive supranuclear palsy) ([place]) 11/08/2016    Priority: High   Lumbar stenosis 06/06/2015    Priority: High   HOCM (hypertrophic obstructive cardiomyopathy) ([place]) 12/12/2014    Priority: High   Mixed hyperlipidemia 08/30/2013    Priority: High   Parkinsonism ([place]) 01/16/2013    Priority: High   History of hypertension  06/01/2008    Priority: High   Tubular adenoma of colon 01/23/2016    Priority: Medium   GLAUCOMA 06/01/2008    Priority: Medium   Testosterone deficiency 11/12/2013    Priority: Low   Impotence of organic origin 08/30/2013    Priority: Low   Seasonal and perennial allergic rhinitis 06/01/2008    Priority: Low   Benign prostatic hyperplasia with lower urinary tract symptoms  09/23/2020   Autonomic dysfunction 09/23/2020   Current Meds  Medication Sig   aspirin 81 MG tablet Take 81 mg by mouth daily.   azelastine (ASTELIN) 0.1 % nasal spray Place 1 spray into both nostrils 2 (two) times daily.   Cholecalciferol (VITAMIN D) 2000 units CAPS Take 1 capsule by mouth once a week.    fexofenadine (ALLEGRA) 60 MG tablet Take 60 mg by mouth daily.    finasteride (PROSCAR) 5 MG tablet Take 5 mg by mouth daily.   fluticasone (FLONASE) 50 MCG/ACT nasal spray SPRAY 1 SPRAY INTO EACH NOSTRIL EVERY DAY   levothyroxine (SYNTHROID) 50 MCG tablet TAKE 1 TABLET BY MOUTH EVERY DAY   Multiple Vitamins-Minerals (MULTIVITAMIN WITH MINERALS) tablet Take 1 tablet by mouth daily.   Naproxen Sodium 220 MG CAPS Take by mouth. Once a month prn   polyethylene glycol (MIRALAX / GLYCOLAX) 17 g packet Take 17 g by mouth daily.   rosuvastatin (CRESTOR) 5 MG tablet TAKE 1 TABLET BY MOUTH EVERY DAY   timolol (BETIMOL) 0.5 % ophthalmic solution Place 1 drop into both eyes 2 (two) times daily.     Allergies: Patient is allergic to [employer] tartrate], atenolol, lunesta [eszopiclone], oxazepam, requip [ropinirole hcl], and ziac [bisoprolol-hydrochlorothiazide]. Family History: Patient family history includes COPD in his mother and sister; Cancer in his father; Colon polyps in his brother and father; Diabetes in his father; Emphysema in his mother; Heart attack in his sister; Heart disease in his mother and sister; Hypertension in his father, mother, and sister; Liver cancer in his father. Social History:  Patient  reports that he has never smoked. He has never used smokeless tobacco. He reports that he does not drink alcohol and does not use drugs.  Review of Systems: Constitutional: Negative for fever malaise or anorexia Cardiovascular: negative for chest pain Respiratory: negative for SOB or persistent cough Gastrointestinal: negative for abdominal pain  Objective  Vitals: BP 129/75    Pulse (!) 52   Ht 5\' 9"  (1.753 m)   Wt 182 lb 12.8 oz (82.9 kg)   SpO2 95%   BMI 26.99 kg/m  General: no acute distress , A&Ox3 HEENT: PEERL, conjunctiva normal, neck is supple Cardiovascular:  RRR without murmur or gallop.  Respiratory:  Good breath sounds bilaterally, upper airway rhonchi present.  No wheezing or rales.  Has difficulty clearing cough  Gastrointestinal: soft, flat abdomen, normal active bowel sounds, no palpable masses, no hepatosplenomegaly, no appreciated hernias Lower extremities: No edema    Commons side effects, risks, benefits, and alternatives for medications and treatment plan prescribed today were discussed, and the patient expressed understanding of the given instructions. Patient is instructed to call or message via MyChart if he/she has any questions or concerns regarding our treatment plan. No barriers to understanding were identified. We discussed Red Flag symptoms and signs in detail. Patient expressed understanding regarding what to do in case of urgent or emergency type symptoms.  Medication list was reconciled, printed and provided to the patient in AVS. Patient instructions and summary information was reviewed with  the patient as documented in the AVS. This note was prepared with assistance of Dragon voice recognition software. Occasional wrong-word or sound-a-like substitutions may have occurred due to the inherent limitations of voice recognition software  This visit occurred during the SARS-CoV-2 public health emergency.  Safety protocols were in place, including screening questions prior to the visit, additional usage of staff PPE, and extensive cleaning of exam room while observing appropriate contact time as indicated for disinfecting solutions.

## 2021-03-27 NOTE — Patient Instructions (Signed)
Please return in March 2023 for your annual complete physical; please come fasting. Sooner if needed.  Can try otc sudafed or allegra D as needed for sinus symptoms.  I've ordered astelin nasal spray to use in addition to your flonase and allegra.   If you have any questions or concerns, please don't hesitate to send me a message via MyChart or call the office at [phone]. Thank you for visiting with [place] today! It's our pleasure caring for you.

## 2021-04-08 IMAGING — RF DG SWALLOWING FUNCTION
12 of 24 series · 12 of 24 positions shown · non-contrast
Comparison: 12/27/2016.

CLINICAL DATA: Dysphagia. Cough/GE reflux disease/other secondary
diagnosis

EXAM:
MODIFIED BARIUM SWALLOW
TECHNIQUE: Different consistencies of barium were administered orally to the
patient by the Speech Pathologist. Imaging of the pharynx was
performed in the lateral projection. The radiologist was present in
the fluoroscopy room for this study, providing personal supervision.
FLUOROSCOPY TIME:  Fluoroscopy Time:  3 minutes and 56 seconds
Radiation Exposure Index (if provided by the fluoroscopic device):
30.42 mGy

[Series 2: run · 1 of 218 frames shown (1 of 12)]
[frame 33/218]
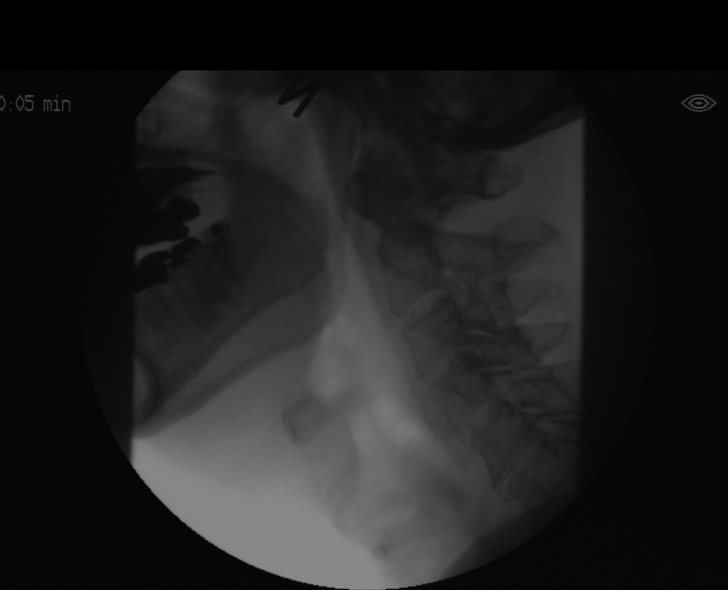

[Series 4: run · 1 of 20 frames shown (2 of 12)]
[frame 1/20]
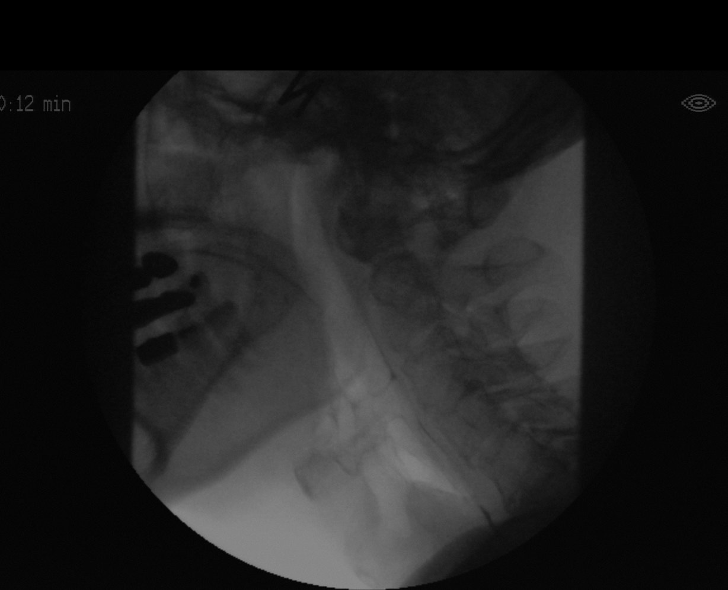

[Series 6: run · 1 of 197 frames shown (3 of 12)]
[frame 99/197]
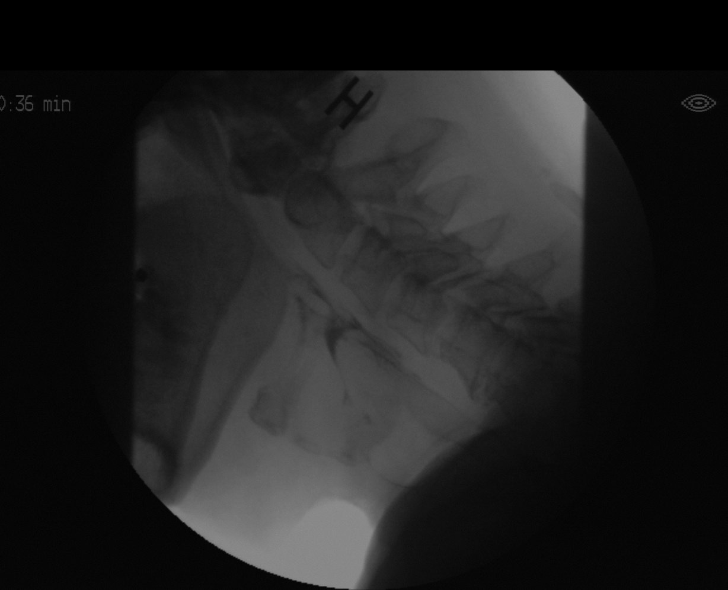

[Series 8: run · 1 of 208 frames shown (4 of 12)]
[frame 82/208]
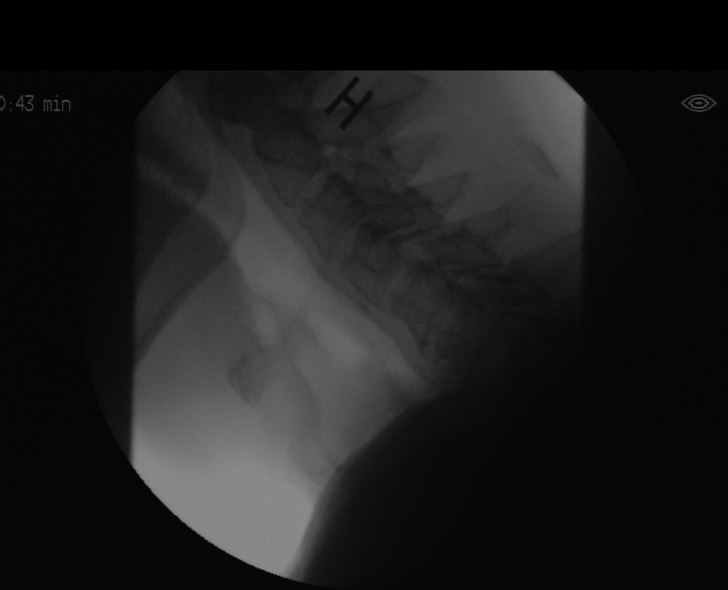

[Series 10: run · 1 of 37 frames shown (5 of 12)]
[frame 19/37]
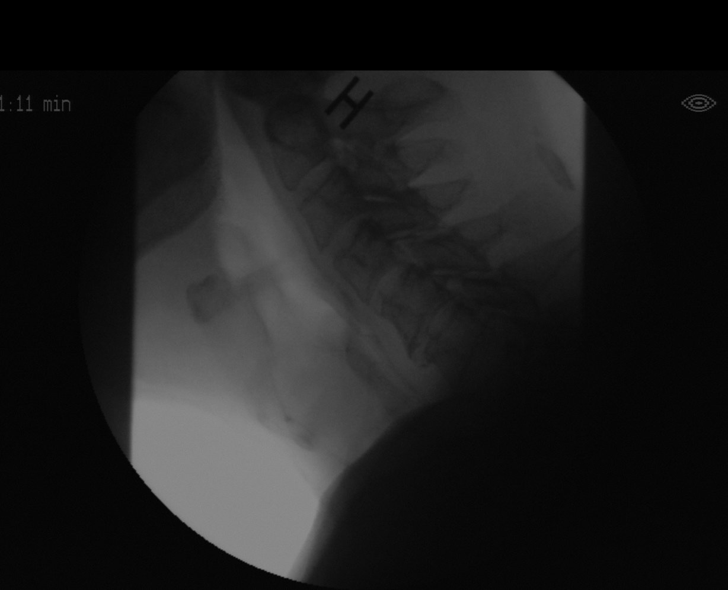

[Series 12: run · 1 of 620 frames shown (6 of 12)]
[frame 94/620]
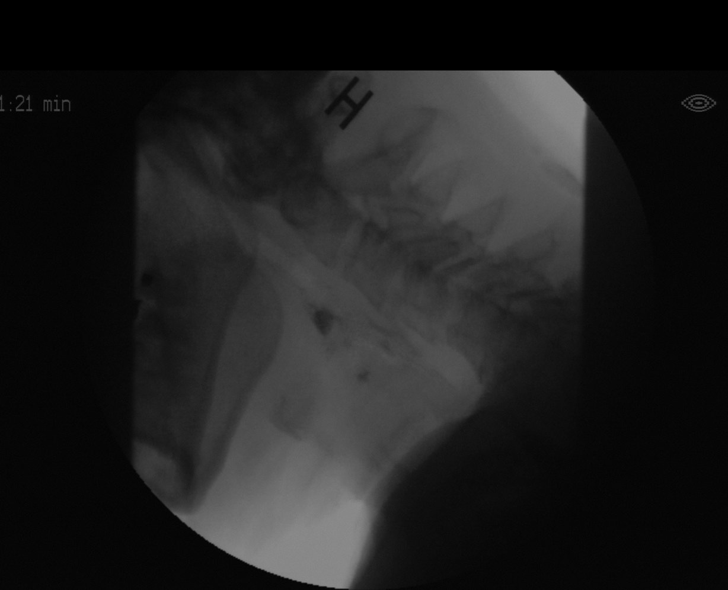

[Series 14: run · 1 of 157 frames shown (7 of 12)]
[frame 79/157]
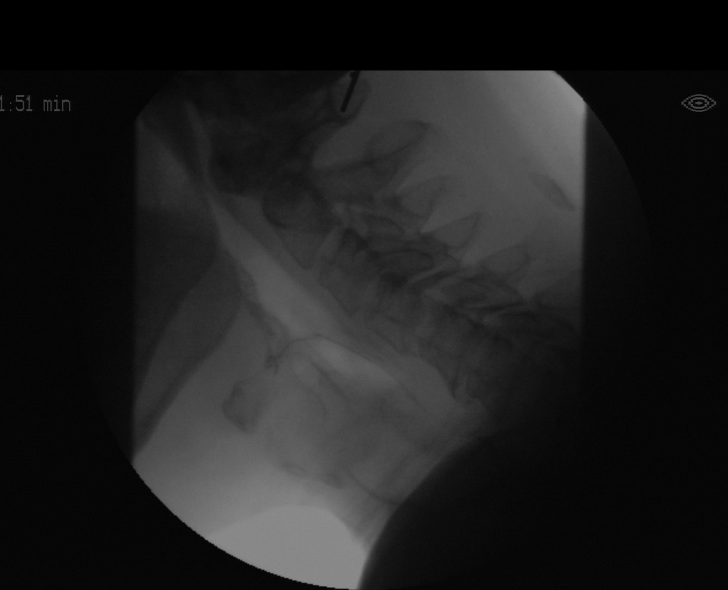

[Series 16: run · 1 of 46 frames shown (8 of 12)]
[frame 24/46]
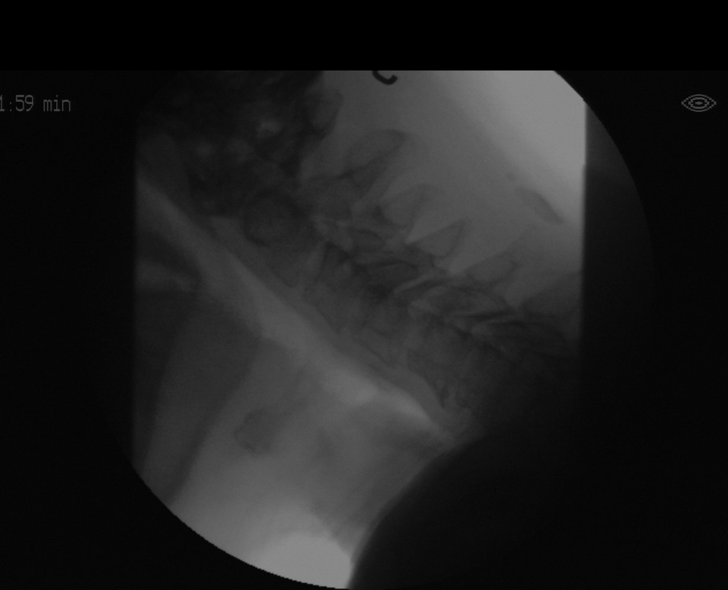

[Series 18: run · 1 of 596 frames shown (9 of 12)]
[frame 299/596]
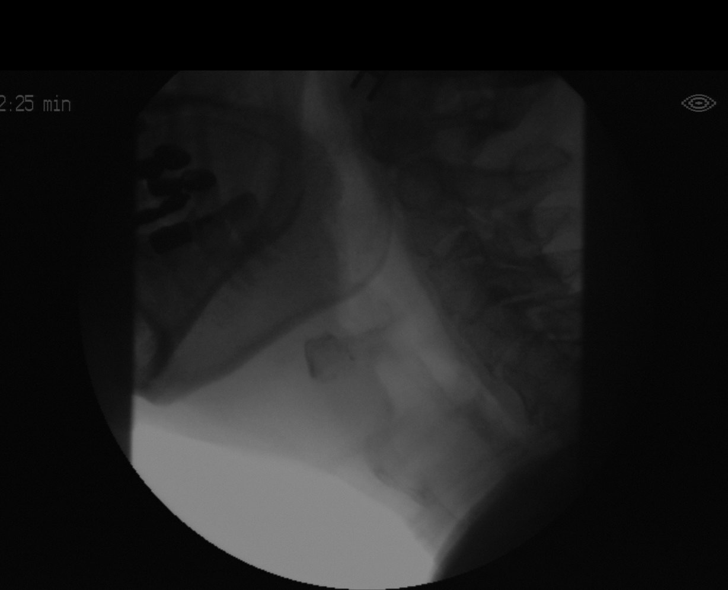

[Series 20: run · 1 of 106 frames shown (10 of 12)]
[frame 55/106]
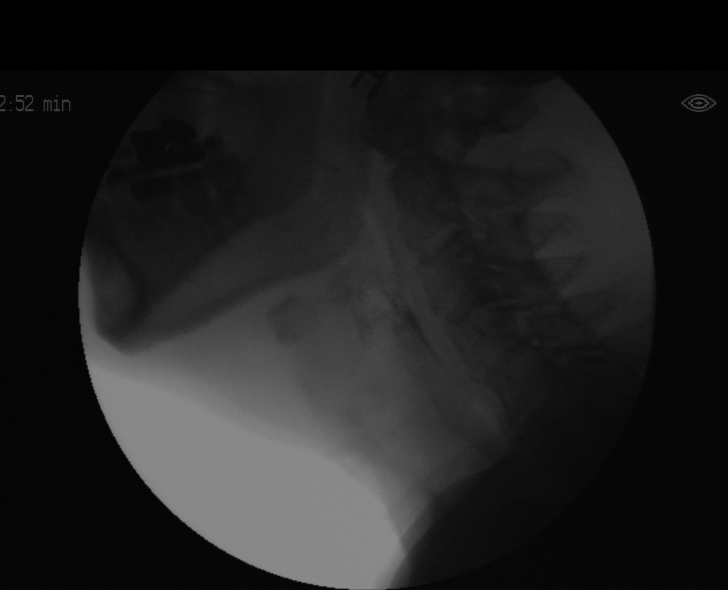

[Series 22: run · 1 of 474 frames shown (11 of 12)]
[frame 403/474]
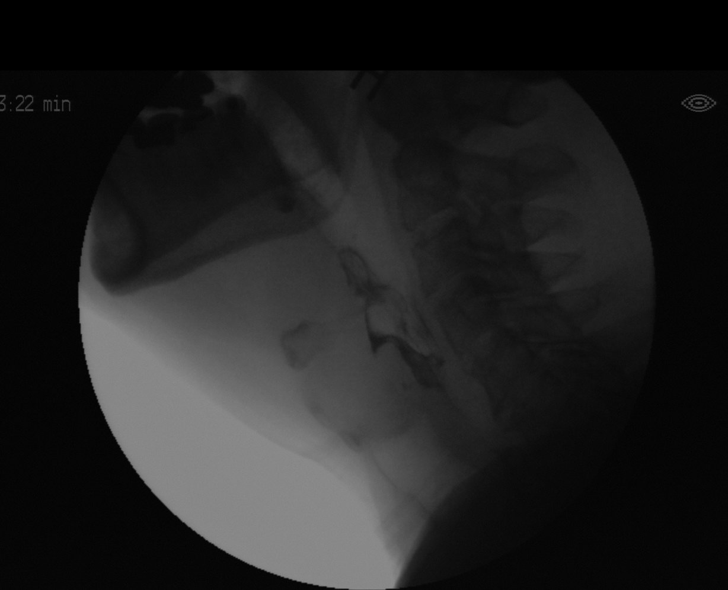

[Series 24: run · 1 of 438 frames shown (12 of 12)]
[frame 373/438]
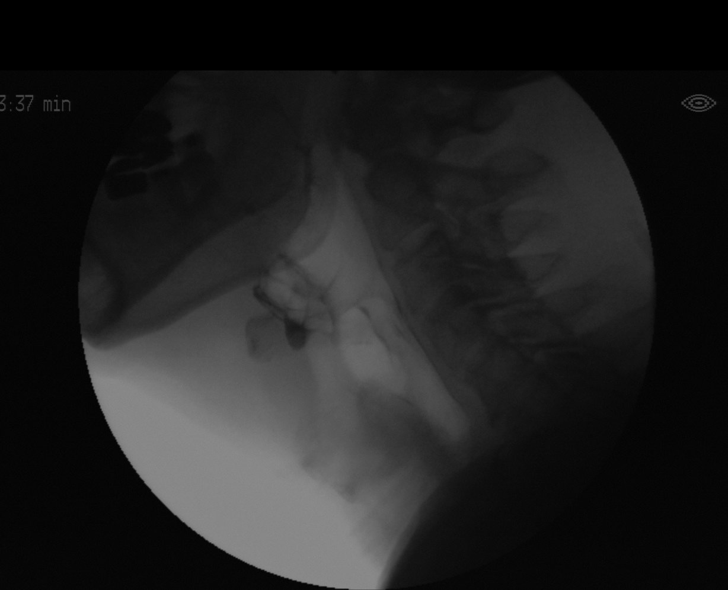

[12 of 24 positions shown; findings below may reference images not displayed]

FINDINGS/IMPRESSION:
Please refer to the Speech Pathologists report for complete details
and recommendations.

## 2021-05-29 ENCOUNTER — Ambulatory Visit: Payer: Medicare Other | Admitting: Neurology

## 2021-06-19 DIAGNOSIS — D225 Melanocytic nevi of trunk: Secondary | ICD-10-CM | POA: Diagnosis not present

## 2021-06-19 DIAGNOSIS — D2261 Melanocytic nevi of right upper limb, including shoulder: Secondary | ICD-10-CM | POA: Diagnosis not present

## 2021-06-19 DIAGNOSIS — Z85828 Personal history of other malignant neoplasm of skin: Secondary | ICD-10-CM | POA: Diagnosis not present

## 2021-06-19 DIAGNOSIS — L570 Actinic keratosis: Secondary | ICD-10-CM | POA: Diagnosis not present

## 2021-07-04 DIAGNOSIS — Z23 Encounter for immunization: Secondary | ICD-10-CM | POA: Diagnosis not present

## 2021-07-12 ENCOUNTER — Other Ambulatory Visit: Payer: Self-pay

## 2021-07-12 ENCOUNTER — Ambulatory Visit (INDEPENDENT_AMBULATORY_CARE_PROVIDER_SITE_OTHER): Payer: Medicare Other | Admitting: Neurology

## 2021-07-12 ENCOUNTER — Encounter: Payer: Self-pay | Admitting: Neurology

## 2021-07-12 ENCOUNTER — Telehealth: Payer: Self-pay | Admitting: Neurology

## 2021-07-12 VITALS — BP 132/72 | HR 59 | Ht 69.0 in | Wt 184.6 lb

## 2021-07-12 DIAGNOSIS — R1310 Dysphagia, unspecified: Secondary | ICD-10-CM

## 2021-07-12 DIAGNOSIS — Z9181 History of falling: Secondary | ICD-10-CM | POA: Diagnosis not present

## 2021-07-12 DIAGNOSIS — G231 Progressive supranuclear ophthalmoplegia [Steele-Richardson-Olszewski]: Secondary | ICD-10-CM | POA: Diagnosis not present

## 2021-07-12 DIAGNOSIS — R2689 Other abnormalities of gait and mobility: Secondary | ICD-10-CM | POA: Diagnosis not present

## 2021-07-12 NOTE — Telephone Encounter (Signed)
Home health referral reviewed & accepted by [facility]. They will call patient to start care.

## 2021-07-12 NOTE — Patient Instructions (Addendum)
It was nice to see you again today.  As discussed, I will refer you to home health physical therapy.  We will send the order to [facility] and request the same therapist if possible, [name].  Please follow-up to see one of our nurse practitioners in 6 months.
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
## 2021-07-14 DIAGNOSIS — Z23 Encounter for immunization: Secondary | ICD-10-CM | POA: Diagnosis not present

## 2021-07-17 DIAGNOSIS — E785 Hyperlipidemia, unspecified: Secondary | ICD-10-CM | POA: Diagnosis not present

## 2021-07-17 DIAGNOSIS — E291 Testicular hypofunction: Secondary | ICD-10-CM | POA: Diagnosis not present

## 2021-07-17 DIAGNOSIS — G4700 Insomnia, unspecified: Secondary | ICD-10-CM | POA: Diagnosis not present

## 2021-07-17 DIAGNOSIS — Z7982 Long term (current) use of aspirin: Secondary | ICD-10-CM | POA: Diagnosis not present

## 2021-07-17 DIAGNOSIS — G231 Progressive supranuclear ophthalmoplegia [Steele-Richardson-Olszewski]: Secondary | ICD-10-CM | POA: Diagnosis not present

## 2021-07-17 DIAGNOSIS — R1310 Dysphagia, unspecified: Secondary | ICD-10-CM | POA: Diagnosis not present

## 2021-07-17 DIAGNOSIS — K5900 Constipation, unspecified: Secondary | ICD-10-CM | POA: Diagnosis not present

## 2021-07-17 DIAGNOSIS — G4733 Obstructive sleep apnea (adult) (pediatric): Secondary | ICD-10-CM | POA: Diagnosis not present

## 2021-07-17 DIAGNOSIS — E039 Hypothyroidism, unspecified: Secondary | ICD-10-CM | POA: Diagnosis not present

## 2021-07-17 DIAGNOSIS — M1990 Unspecified osteoarthritis, unspecified site: Secondary | ICD-10-CM | POA: Diagnosis not present

## 2021-07-17 DIAGNOSIS — I10 Essential (primary) hypertension: Secondary | ICD-10-CM | POA: Diagnosis not present

## 2021-07-17 DIAGNOSIS — M5030 Other cervical disc degeneration, unspecified cervical region: Secondary | ICD-10-CM | POA: Diagnosis not present

## 2021-07-17 DIAGNOSIS — H409 Unspecified glaucoma: Secondary | ICD-10-CM | POA: Diagnosis not present

## 2021-07-17 DIAGNOSIS — R471 Dysarthria and anarthria: Secondary | ICD-10-CM | POA: Diagnosis not present

## 2021-07-17 DIAGNOSIS — Z9181 History of falling: Secondary | ICD-10-CM | POA: Diagnosis not present

## 2021-07-21 DIAGNOSIS — R1310 Dysphagia, unspecified: Secondary | ICD-10-CM | POA: Diagnosis not present

## 2021-07-21 DIAGNOSIS — R471 Dysarthria and anarthria: Secondary | ICD-10-CM | POA: Diagnosis not present

## 2021-07-21 DIAGNOSIS — I10 Essential (primary) hypertension: Secondary | ICD-10-CM | POA: Diagnosis not present

## 2021-07-21 DIAGNOSIS — M1990 Unspecified osteoarthritis, unspecified site: Secondary | ICD-10-CM | POA: Diagnosis not present

## 2021-07-21 DIAGNOSIS — M5030 Other cervical disc degeneration, unspecified cervical region: Secondary | ICD-10-CM | POA: Diagnosis not present

## 2021-07-21 DIAGNOSIS — G231 Progressive supranuclear ophthalmoplegia [Steele-Richardson-Olszewski]: Secondary | ICD-10-CM | POA: Diagnosis not present

## 2021-07-24 DIAGNOSIS — R471 Dysarthria and anarthria: Secondary | ICD-10-CM | POA: Diagnosis not present

## 2021-07-24 DIAGNOSIS — M1990 Unspecified osteoarthritis, unspecified site: Secondary | ICD-10-CM | POA: Diagnosis not present

## 2021-07-24 DIAGNOSIS — R1310 Dysphagia, unspecified: Secondary | ICD-10-CM | POA: Diagnosis not present

## 2021-07-24 DIAGNOSIS — M5030 Other cervical disc degeneration, unspecified cervical region: Secondary | ICD-10-CM | POA: Diagnosis not present

## 2021-07-24 DIAGNOSIS — I10 Essential (primary) hypertension: Secondary | ICD-10-CM | POA: Diagnosis not present

## 2021-07-24 DIAGNOSIS — G231 Progressive supranuclear ophthalmoplegia [Steele-Richardson-Olszewski]: Secondary | ICD-10-CM | POA: Diagnosis not present

## 2021-07-28 DIAGNOSIS — I10 Essential (primary) hypertension: Secondary | ICD-10-CM | POA: Diagnosis not present

## 2021-07-28 DIAGNOSIS — G231 Progressive supranuclear ophthalmoplegia [Steele-Richardson-Olszewski]: Secondary | ICD-10-CM | POA: Diagnosis not present

## 2021-07-28 DIAGNOSIS — R471 Dysarthria and anarthria: Secondary | ICD-10-CM | POA: Diagnosis not present

## 2021-07-28 DIAGNOSIS — M1990 Unspecified osteoarthritis, unspecified site: Secondary | ICD-10-CM | POA: Diagnosis not present

## 2021-07-28 DIAGNOSIS — R1310 Dysphagia, unspecified: Secondary | ICD-10-CM | POA: Diagnosis not present

## 2021-07-28 DIAGNOSIS — M5030 Other cervical disc degeneration, unspecified cervical region: Secondary | ICD-10-CM | POA: Diagnosis not present

## 2021-07-31 DIAGNOSIS — R471 Dysarthria and anarthria: Secondary | ICD-10-CM | POA: Diagnosis not present

## 2021-07-31 DIAGNOSIS — G231 Progressive supranuclear ophthalmoplegia [Steele-Richardson-Olszewski]: Secondary | ICD-10-CM | POA: Diagnosis not present

## 2021-07-31 DIAGNOSIS — R1310 Dysphagia, unspecified: Secondary | ICD-10-CM | POA: Diagnosis not present

## 2021-07-31 DIAGNOSIS — I10 Essential (primary) hypertension: Secondary | ICD-10-CM | POA: Diagnosis not present

## 2021-07-31 DIAGNOSIS — M1990 Unspecified osteoarthritis, unspecified site: Secondary | ICD-10-CM | POA: Diagnosis not present

## 2021-07-31 DIAGNOSIS — M5030 Other cervical disc degeneration, unspecified cervical region: Secondary | ICD-10-CM | POA: Diagnosis not present

## 2021-08-02 DIAGNOSIS — N319 Neuromuscular dysfunction of bladder, unspecified: Secondary | ICD-10-CM | POA: Diagnosis not present

## 2021-08-02 DIAGNOSIS — N130 Hydronephrosis with ureteropelvic junction obstruction: Secondary | ICD-10-CM | POA: Diagnosis not present

## 2021-08-04 DIAGNOSIS — I10 Essential (primary) hypertension: Secondary | ICD-10-CM | POA: Diagnosis not present

## 2021-08-04 DIAGNOSIS — R471 Dysarthria and anarthria: Secondary | ICD-10-CM | POA: Diagnosis not present

## 2021-08-04 DIAGNOSIS — M1990 Unspecified osteoarthritis, unspecified site: Secondary | ICD-10-CM | POA: Diagnosis not present

## 2021-08-04 DIAGNOSIS — R1310 Dysphagia, unspecified: Secondary | ICD-10-CM | POA: Diagnosis not present

## 2021-08-04 DIAGNOSIS — M5030 Other cervical disc degeneration, unspecified cervical region: Secondary | ICD-10-CM | POA: Diagnosis not present

## 2021-08-04 DIAGNOSIS — G231 Progressive supranuclear ophthalmoplegia [Steele-Richardson-Olszewski]: Secondary | ICD-10-CM | POA: Diagnosis not present

## 2021-08-07 DIAGNOSIS — R1310 Dysphagia, unspecified: Secondary | ICD-10-CM | POA: Diagnosis not present

## 2021-08-07 DIAGNOSIS — M1990 Unspecified osteoarthritis, unspecified site: Secondary | ICD-10-CM | POA: Diagnosis not present

## 2021-08-07 DIAGNOSIS — R471 Dysarthria and anarthria: Secondary | ICD-10-CM | POA: Diagnosis not present

## 2021-08-07 DIAGNOSIS — G231 Progressive supranuclear ophthalmoplegia [Steele-Richardson-Olszewski]: Secondary | ICD-10-CM | POA: Diagnosis not present

## 2021-08-07 DIAGNOSIS — I10 Essential (primary) hypertension: Secondary | ICD-10-CM | POA: Diagnosis not present

## 2021-08-07 DIAGNOSIS — M5030 Other cervical disc degeneration, unspecified cervical region: Secondary | ICD-10-CM | POA: Diagnosis not present

## 2021-08-14 DIAGNOSIS — R1310 Dysphagia, unspecified: Secondary | ICD-10-CM | POA: Diagnosis not present

## 2021-08-14 DIAGNOSIS — I10 Essential (primary) hypertension: Secondary | ICD-10-CM | POA: Diagnosis not present

## 2021-08-14 DIAGNOSIS — R471 Dysarthria and anarthria: Secondary | ICD-10-CM | POA: Diagnosis not present

## 2021-08-14 DIAGNOSIS — G231 Progressive supranuclear ophthalmoplegia [Steele-Richardson-Olszewski]: Secondary | ICD-10-CM | POA: Diagnosis not present

## 2021-08-14 DIAGNOSIS — M1990 Unspecified osteoarthritis, unspecified site: Secondary | ICD-10-CM | POA: Diagnosis not present

## 2021-08-14 DIAGNOSIS — M5030 Other cervical disc degeneration, unspecified cervical region: Secondary | ICD-10-CM | POA: Diagnosis not present

## 2021-08-16 DIAGNOSIS — E291 Testicular hypofunction: Secondary | ICD-10-CM | POA: Diagnosis not present

## 2021-08-16 DIAGNOSIS — M5030 Other cervical disc degeneration, unspecified cervical region: Secondary | ICD-10-CM | POA: Diagnosis not present

## 2021-08-16 DIAGNOSIS — Z7982 Long term (current) use of aspirin: Secondary | ICD-10-CM | POA: Diagnosis not present

## 2021-08-16 DIAGNOSIS — I10 Essential (primary) hypertension: Secondary | ICD-10-CM | POA: Diagnosis not present

## 2021-08-16 DIAGNOSIS — Z9181 History of falling: Secondary | ICD-10-CM | POA: Diagnosis not present

## 2021-08-16 DIAGNOSIS — R1310 Dysphagia, unspecified: Secondary | ICD-10-CM | POA: Diagnosis not present

## 2021-08-16 DIAGNOSIS — E039 Hypothyroidism, unspecified: Secondary | ICD-10-CM | POA: Diagnosis not present

## 2021-08-16 DIAGNOSIS — E785 Hyperlipidemia, unspecified: Secondary | ICD-10-CM | POA: Diagnosis not present

## 2021-08-16 DIAGNOSIS — M1990 Unspecified osteoarthritis, unspecified site: Secondary | ICD-10-CM | POA: Diagnosis not present

## 2021-08-16 DIAGNOSIS — G231 Progressive supranuclear ophthalmoplegia [Steele-Richardson-Olszewski]: Secondary | ICD-10-CM | POA: Diagnosis not present

## 2021-08-16 DIAGNOSIS — K5900 Constipation, unspecified: Secondary | ICD-10-CM | POA: Diagnosis not present

## 2021-08-16 DIAGNOSIS — R471 Dysarthria and anarthria: Secondary | ICD-10-CM | POA: Diagnosis not present

## 2021-08-16 DIAGNOSIS — G4733 Obstructive sleep apnea (adult) (pediatric): Secondary | ICD-10-CM | POA: Diagnosis not present

## 2021-08-16 DIAGNOSIS — H409 Unspecified glaucoma: Secondary | ICD-10-CM | POA: Diagnosis not present

## 2021-08-16 DIAGNOSIS — G4700 Insomnia, unspecified: Secondary | ICD-10-CM | POA: Diagnosis not present

## 2021-08-21 DIAGNOSIS — I10 Essential (primary) hypertension: Secondary | ICD-10-CM | POA: Diagnosis not present

## 2021-08-21 DIAGNOSIS — G231 Progressive supranuclear ophthalmoplegia [Steele-Richardson-Olszewski]: Secondary | ICD-10-CM | POA: Diagnosis not present

## 2021-08-21 DIAGNOSIS — M5030 Other cervical disc degeneration, unspecified cervical region: Secondary | ICD-10-CM | POA: Diagnosis not present

## 2021-08-21 DIAGNOSIS — M1990 Unspecified osteoarthritis, unspecified site: Secondary | ICD-10-CM | POA: Diagnosis not present

## 2021-08-21 DIAGNOSIS — R1310 Dysphagia, unspecified: Secondary | ICD-10-CM | POA: Diagnosis not present

## 2021-08-21 DIAGNOSIS — R471 Dysarthria and anarthria: Secondary | ICD-10-CM | POA: Diagnosis not present

## 2021-08-28 DIAGNOSIS — M1990 Unspecified osteoarthritis, unspecified site: Secondary | ICD-10-CM | POA: Diagnosis not present

## 2021-08-28 DIAGNOSIS — I10 Essential (primary) hypertension: Secondary | ICD-10-CM | POA: Diagnosis not present

## 2021-08-28 DIAGNOSIS — R471 Dysarthria and anarthria: Secondary | ICD-10-CM | POA: Diagnosis not present

## 2021-08-28 DIAGNOSIS — R1310 Dysphagia, unspecified: Secondary | ICD-10-CM | POA: Diagnosis not present

## 2021-08-28 DIAGNOSIS — M5030 Other cervical disc degeneration, unspecified cervical region: Secondary | ICD-10-CM | POA: Diagnosis not present

## 2021-08-28 DIAGNOSIS — G231 Progressive supranuclear ophthalmoplegia [Steele-Richardson-Olszewski]: Secondary | ICD-10-CM | POA: Diagnosis not present

## 2021-09-15 DIAGNOSIS — Z961 Presence of intraocular lens: Secondary | ICD-10-CM | POA: Diagnosis not present

## 2021-09-15 DIAGNOSIS — H401132 Primary open-angle glaucoma, bilateral, moderate stage: Secondary | ICD-10-CM | POA: Diagnosis not present

## 2021-09-17 ENCOUNTER — Other Ambulatory Visit: Payer: Self-pay | Admitting: Family Medicine

## 2021-09-18 DIAGNOSIS — D692 Other nonthrombocytopenic purpura: Secondary | ICD-10-CM | POA: Diagnosis not present

## 2021-09-18 DIAGNOSIS — L570 Actinic keratosis: Secondary | ICD-10-CM | POA: Diagnosis not present

## 2021-09-18 DIAGNOSIS — L821 Other seborrheic keratosis: Secondary | ICD-10-CM | POA: Diagnosis not present

## 2021-09-18 DIAGNOSIS — Z85828 Personal history of other malignant neoplasm of skin: Secondary | ICD-10-CM | POA: Diagnosis not present

## 2021-12-27 ENCOUNTER — Ambulatory Visit (INDEPENDENT_AMBULATORY_CARE_PROVIDER_SITE_OTHER): Payer: Medicare Other | Admitting: Family Medicine

## 2021-12-27 ENCOUNTER — Encounter: Payer: Self-pay | Admitting: Family Medicine

## 2021-12-27 VITALS — BP 136/76 | HR 55 | Temp 97.9°F | Ht 69.0 in | Wt 184.0 lb

## 2021-12-27 DIAGNOSIS — G231 Progressive supranuclear ophthalmoplegia [Steele-Richardson-Olszewski]: Secondary | ICD-10-CM

## 2021-12-27 DIAGNOSIS — G20 Parkinson's disease: Secondary | ICD-10-CM | POA: Diagnosis not present

## 2021-12-27 DIAGNOSIS — N401 Enlarged prostate with lower urinary tract symptoms: Secondary | ICD-10-CM

## 2021-12-27 DIAGNOSIS — Z8679 Personal history of other diseases of the circulatory system: Secondary | ICD-10-CM

## 2021-12-27 DIAGNOSIS — G901 Familial dysautonomia [Riley-Day]: Secondary | ICD-10-CM | POA: Diagnosis not present

## 2021-12-27 DIAGNOSIS — I421 Obstructive hypertrophic cardiomyopathy: Secondary | ICD-10-CM

## 2021-12-27 DIAGNOSIS — E039 Hypothyroidism, unspecified: Secondary | ICD-10-CM

## 2021-12-27 DIAGNOSIS — D126 Benign neoplasm of colon, unspecified: Secondary | ICD-10-CM | POA: Diagnosis not present

## 2021-12-27 DIAGNOSIS — E782 Mixed hyperlipidemia: Secondary | ICD-10-CM | POA: Diagnosis not present

## 2021-12-27 LAB — COMPREHENSIVE METABOLIC PANEL
ALT: 21 U/L (ref 0–53)
AST: 20 U/L (ref 0–37)
Albumin: 4.5 g/dL (ref 3.5–5.2)
Alkaline Phosphatase: 52 U/L (ref 39–117)
BUN: 13 mg/dL (ref 6–23)
CO2: 27 mEq/L (ref 19–32)
Calcium: 9.4 mg/dL (ref 8.4–10.5)
Chloride: 104 mEq/L (ref 96–112)
Creatinine, Ser: 0.66 mg/dL (ref 0.40–1.50)
GFR: 92.19 mL/min (ref >60.00)
Glucose, Bld: 91 mg/dL (ref 70–99)
Potassium: 4.5 mEq/L (ref 3.5–5.1)
Sodium: 142 mEq/L (ref 135–145)
Total Bilirubin: 0.5 mg/dL (ref 0.2–1.2)
Total Protein: 6.9 g/dL (ref 6.0–8.3)

## 2021-12-27 LAB — LIPID PANEL
Cholesterol: 122 mg/dL (ref 0–200)
HDL: 40.5 mg/dL (ref >39.00)
LDL Cholesterol: 41 mg/dL (ref 0–99)
NonHDL: 81.39
Total CHOL/HDL Ratio: 3
Triglycerides: 200 mg/dL — ABNORMAL HIGH (ref 0.0–149.0)
VLDL: 40 mg/dL (ref 0.0–40.0)

## 2021-12-27 LAB — CBC WITH DIFFERENTIAL/PLATELET
Basophils Absolute: 0.1 10*3/uL (ref 0.0–0.1)
Basophils Relative: 0.8 % (ref 0.0–3.0)
Eosinophils Absolute: 1 10*3/uL — ABNORMAL HIGH (ref 0.0–0.7)
Eosinophils Relative: 13.6 % — ABNORMAL HIGH (ref 0.0–5.0)
HCT: 42.7 % (ref 39.0–52.0)
Hemoglobin: 14.7 g/dL (ref 13.0–17.0)
Lymphocytes Relative: 27.4 % (ref 12.0–46.0)
Lymphs Abs: 2 10*3/uL (ref 0.7–4.0)
MCHC: 34.3 g/dL (ref 30.0–36.0)
MCV: 91.2 fl (ref 78.0–100.0)
Monocytes Absolute: 0.6 10*3/uL (ref 0.1–1.0)
Monocytes Relative: 8.2 % (ref 3.0–12.0)
Neutro Abs: 3.6 10*3/uL (ref 1.4–7.7)
Neutrophils Relative %: 50 % (ref 43.0–77.0)
Platelets: 203 10*3/uL (ref 150.0–400.0)
RBC: 4.68 Mil/uL (ref 4.22–5.81)
RDW: 13.3 % (ref 11.5–15.5)
WBC: 7.1 10*3/uL (ref 4.0–10.5)

## 2021-12-27 LAB — TSH: TSH: 1.93 u[IU]/mL (ref 0.35–5.50)

## 2021-12-27 LAB — PSA: PSA: 0.81 ng/mL (ref 0.10–4.00)

## 2021-12-27 MED ORDER — SHINGRIX 50 MCG/0.5ML IM SUSR: 0.5000 mL | Freq: Once | INTRAMUSCULAR | 0 refills | Status: AC

## 2021-12-27 NOTE — Progress Notes (Signed)
? ?Subjective  ?CC:  ?Chief Complaint  ?Patient presents with  ? Annual Exam  ?  Pt is not fasting.   ? Hypertension  ? Hypothyroidism  ? Hyperlipidemia  ? Immunizations  ?  Wife is requesting shingles vaccine. She was told to follow up with Pharmacy.   ? ? ?HPI: [name] is a 76 y.o. male who presents to the office today to address the problems listed above in the chief complaint. ?76 year old multiple medical problems as listed below in the assessment.  Reviewed recent neurology follow-up for his Parkinson's disease and PSP.  This is his major morbidity.  Multiple falls.  Fall prevention is in place and he has had physical therapy.  Difficult to communicate for cognition remains intact.  Trouble swallowing and is on a pur?ed diet.  Coughing is active since allergy season has started.  He does take Allegra and Astelin. ?Autonomic dysfunction with hypotension at times. ?Hypothyroidism on 50 mcg of levothyroxine daily. ?History of tubular adenomas: Wife questions whether repeat colonoscopy is needed.  This is difficult given his ambulation difficulties and problems with fecal incontinence at times. ? ?Assessment  ?1. Acquired hypothyroidism   ?2. Benign prostatic hyperplasia with lower urinary tract symptoms, symptom details unspecified   ?3. Mixed hyperlipidemia   ?4. History of hypertension   ?5. HOCM (hypertrophic obstructive cardiomyopathy) ([place])   ?6. Parkinson's disease ([place])   ?7. PSP (progressive supranuclear palsy) ([place])   ?8. Dysautonomia ([place]) Chronic  ?9. Tubular adenoma of colon   ? ?  ?Plan  ?Chronic multiple medical problems: Discussed each in detail.  Wife reports that overall he is stable.  Follow his biggest problem.  Also clearing secretions and swallowing.  We will follow-up with neurology as recommended.  I will check lab work to follow-up on his lipids, thyroid, renal function and electrolytes.  No change in medications today.  He will follow-up with GI to discuss whether further  colonoscopies are indicated.  I did review his advance directives.  He has a copy in the chart ?Shingrix prescription printed to take to pharmacy ?Follow up: 12 months for complete physical ?02/19/2022 ? ?Orders Placed This Encounter  ?Procedures  ? CBC with Differential/Platelet  ? Comprehensive metabolic panel  ? Lipid panel  ? PSA  ? TSH  ? ?Meds ordered this encounter  ?Medications  ? Zoster Vaccine Adjuvanted [facility]) injection  ?  Sig: Inject 0.5 mLs into the muscle once for 1 dose. Please give 2nd dose 2-6 months after first dose  ?  Dispense:  2 each  ?  Refill:  0  ? ?  ? ?I reviewed the patients updated PMH, FH, and SocHx.  ?  ?Patient Active Problem List  ? Diagnosis Date Noted  ? Hypothyroidism 02/18/2018  ?  Priority: High  ? Peripheral neuropathy 11/14/2016  ?  Priority: High  ? PSP (progressive supranuclear palsy) ([place]) 11/08/2016  ?  Priority: High  ? Lumbar stenosis 06/06/2015  ?  Priority: High  ? HOCM (hypertrophic obstructive cardiomyopathy) ([place]) 12/12/2014  ?  Priority: High  ? Mixed hyperlipidemia 08/30/2013  ?  Priority: High  ? Parkinsonism ([place]) 01/16/2013  ?  Priority: High  ? History of hypertension 06/01/2008  ?  Priority: High  ? Tubular adenoma of colon 01/23/2016  ?  Priority: Medium   ? GLAUCOMA 06/01/2008  ?  Priority: Medium   ? Testosterone deficiency 11/12/2013  ?  Priority: Low  ? Impotence of organic origin 08/30/2013  ?  Priority:  Low  ? Seasonal and perennial allergic rhinitis 06/01/2008  ?  Priority: Low  ? Benign prostatic hyperplasia with lower urinary tract symptoms 09/23/2020  ? Dysautonomia ([place])   ? ?Current Meds  ?Medication Sig  ? aspirin 81 MG tablet Take 81 mg by mouth daily.  ? azelastine (ASTELIN) 0.1 % nasal spray Place 1 spray into both nostrils 2 (two) times daily.  ? Cholecalciferol (VITAMIN D) 2000 units CAPS Take 1 capsule by mouth once a week.   ? fexofenadine (ALLEGRA) 60 MG tablet Take 60 mg by mouth daily.   ? finasteride (PROSCAR) 5 MG tablet Take 5  mg by mouth daily.  ? fluticasone (FLONASE) 50 MCG/ACT nasal spray SPRAY 1 SPRAY INTO EACH NOSTRIL EVERY DAY  ? levothyroxine (SYNTHROID) 50 MCG tablet TAKE 1 TABLET BY MOUTH EVERY DAY  ? Multiple Vitamins-Minerals (MULTIVITAMIN WITH MINERALS) tablet Take 1 tablet by mouth daily.  ? polyethylene glycol (MIRALAX / GLYCOLAX) 17 g packet Take 17 g by mouth daily.  ? rosuvastatin (CRESTOR) 5 MG tablet TAKE 1 TABLET BY MOUTH EVERY DAY  ? timolol (BETIMOL) 0.5 % ophthalmic solution Place 1 drop into both eyes 2 (two) times daily.   ? Zoster Vaccine Adjuvanted [facility]) injection Inject 0.5 mLs into the muscle once for 1 dose. Please give 2nd dose 2-6 months after first dose  ? ? ?Allergies: ?Patient is allergic to [employer] tartrate], atenolol, lunesta [eszopiclone], oxazepam, requip [ropinirole hcl], and ziac [bisoprolol-hydrochlorothiazide]. ?Family History: ?Patient family history includes COPD in his mother and sister; Cancer in his father; Colon polyps in his brother and father; Diabetes in his father; Emphysema in his mother; Heart attack in his sister; Heart disease in his mother and sister; Hypertension in his father, mother, and sister; Liver cancer in his father. ?Social History:  ?Patient  reports that he has never smoked. He has never used smokeless tobacco. He reports that he does not drink alcohol and does not use drugs. ? ?Review of Systems: ?Constitutional: Negative for fever malaise or anorexia ?Cardiovascular: negative for chest pain ?Respiratory: negative for SOB or persistent cough ?Gastrointestinal: negative for abdominal pain ? ?Objective  ?Vitals: BP 136/76   Pulse (!) 55   Temp 97.9 ?F (36.6 ?C) (Temporal)   Ht '5\' 9"'$  (1.753 m)   Wt 184 lb (83.5 kg)   SpO2 96%   BMI 27.17 kg/m?  ?General: no acute distress , A&Ox3 ?Difficult to understand, coughing due to trouble clearing secretions ?HEENT: PEERL, conjunctiva normal, neck is supple ?Cardiovascular:  RRR without murmur or gallop.   ?Respiratory:  Good breath sounds bilaterally, CTAB with normal respiratory effort ?Skin:  Warm, no rashes ?No edema ?Soft nontender abdomen ? ? ? ?Commons side effects, risks, benefits, and alternatives for medications and treatment plan prescribed today were discussed, and the patient expressed understanding of the given instructions. Patient is instructed to call or message via MyChart if he/she has any questions or concerns regarding our treatment plan. No barriers to understanding were identified. We discussed Red Flag symptoms and signs in detail. Patient expressed understanding regarding what to do in case of urgent or emergency type symptoms.  ?Medication list was reconciled, printed and provided to the patient in AVS. Patient instructions and summary information was reviewed with the patient as documented in the AVS. ?This note was prepared with assistance of Systems analyst. Occasional wrong-word or sound-a-like substitutions may have occurred due to the inherent limitations of voice recognition software ? ?This visit occurred during the  SARS-CoV-2 public health emergency.  Safety protocols were in place, including screening questions prior to the visit, additional usage of staff PPE, and extensive cleaning of exam room while observing appropriate contact time as indicated for disinfecting solutions.  ? ?

## 2021-12-27 NOTE — Patient Instructions (Signed)
Please return in 12 months for your annual complete physical; please come fasting.  ? ?I will release your lab results to you on your MyChart account with further instructions. You may see the results before I do, but when I review them I will send you a message with my report or have my assistant call you if things need to be discussed. Please reply to my message with any questions. Thank you!  ? ?Please take the prescription for Shingrix to the pharmacy so they may administer the vaccinations. Your insurance will then cover the injections.  ? ?If you have any questions or concerns, please don't hesitate to send me a message via MyChart or call the office at [phone]. Thank you for visiting with [name] today! It's our pleasure caring for you.  ?

## 2022-01-22 ENCOUNTER — Ambulatory Visit (INDEPENDENT_AMBULATORY_CARE_PROVIDER_SITE_OTHER): Payer: Medicare Other | Admitting: Adult Health

## 2022-01-22 ENCOUNTER — Encounter: Payer: Self-pay | Admitting: Adult Health

## 2022-01-22 VITALS — BP 128/71 | HR 55 | Ht 68.0 in | Wt 182.4 lb

## 2022-01-22 DIAGNOSIS — G231 Progressive supranuclear ophthalmoplegia [Steele-Richardson-Olszewski]: Secondary | ICD-10-CM

## 2022-01-22 DIAGNOSIS — Z9181 History of falling: Secondary | ICD-10-CM | POA: Diagnosis not present

## 2022-01-22 NOTE — Progress Notes (Signed)
? ? ?PATIENT: [name] ?DOB: [birth year] ? ?REASON FOR VISIT: follow up ?HISTORY FROM: patient ?PRIMARY NEUROLOGIST: Dr. Rexene Alberts ? ?Chief Complaint  ?Patient presents with  ? Follow-up  ?  [name] with wife Pt is here for PSP ( progressive supranuclear palsy)  follow up Wife states there has been little change since October 2022   ? ? ? ?HISTORY OF PRESENT ILLNESS: ?Today 01/22/22: ?  ?Mr. [name] is a 76 year old male with a history of progressive supranuclear palsy.he returns today for follow-up. Wife feels that things have remained stable. Patient reports that he has fallen more. Fell 2 weeks ago- tried to make a quick turn and fell. PT did work with the patient back in the fall. Good appetite. Food is pureed. Uses a shower chair and wife assist with ADLS. Able to feed himself. ? ?HISTORY 07/12/2021: He reports very little, he is hard to understand, wife provides his history.  He has fallen, has skinned his knees primarily per his report and she reports that he has not had any serious injuries, uses his walker at all times including in the house.  He has not had any recent formal therapy.  We mutually agreed to pursue home health therapy.  He worked with therapist Rande Lawman before with advanced home care.  Their daughters help out on the weekends primarily.  He has not had any recent constipation although he reports that he feels stopped up, wife reports that he has soft stools, he continues to eat pur?ed food. ?  ? ?REVIEW OF SYSTEMS: Out of a complete 14 system review of symptoms, the patient complains only of the following symptoms, and all other reviewed systems are negative. ? ?See HPI ? ?ALLERGIES: ?Allergies  ?Allergen Reactions  ? Ambien [Zolpidem Tartrate]   ?  Unknown- balance  ? Atenolol   ?  unknown  ? Lunesta [Eszopiclone]   ?  unknown  ? Oxazepam   ?  unknown  ? Requip [Ropinirole Hcl]   ?  unknown  ? Ziac [Bisoprolol-Hydrochlorothiazide]   ?  unknown  ? ? ?HOME MEDICATIONS: ?Outpatient  Medications Prior to Visit  ?Medication Sig Dispense Refill  ? aspirin 81 MG tablet Take 81 mg by mouth daily.    ? azelastine (ASTELIN) 0.1 % nasal spray Place 1 spray into both nostrils 2 (two) times daily. 30 mL 11  ? Cholecalciferol (VITAMIN D) 2000 units CAPS Take 1 capsule by mouth once a week.     ? fexofenadine (ALLEGRA) 60 MG tablet Take 60 mg by mouth daily.     ? finasteride (PROSCAR) 5 MG tablet Take 5 mg by mouth daily.  11  ? fluticasone (FLONASE) 50 MCG/ACT nasal spray SPRAY 1 SPRAY INTO EACH NOSTRIL EVERY DAY 48 mL 2  ? levothyroxine (SYNTHROID) 50 MCG tablet TAKE 1 TABLET BY MOUTH EVERY DAY 90 tablet 3  ? Multiple Vitamins-Minerals (MULTIVITAMIN WITH MINERALS) tablet Take 1 tablet by mouth daily.    ? Naproxen Sodium 220 MG CAPS Take by mouth. Once a month prn    ? polyethylene glycol (MIRALAX / GLYCOLAX) 17 g packet Take 17 g by mouth daily.    ? rosuvastatin (CRESTOR) 5 MG tablet TAKE 1 TABLET BY MOUTH EVERY DAY 90 tablet 3  ? timolol (BETIMOL) 0.5 % ophthalmic solution Place 1 drop into both eyes 2 (two) times daily.     ? ?No facility-administered medications prior to visit.  ? ? ?PAST MEDICAL HISTORY: ?Past Medical History:  ?  Diagnosis Date  ? Allergic rhinitis   ? Allergy   ? Arthritis   ? At risk for falls   ? Cancer [facility])   ? basal cell skin cancer  ? Cataract   ? Complication of anesthesia   ? Slow to awaken  ? Constipation   ? DDD (degenerative disc disease), cervical   ? Dysphagia   ? GERD (gastroesophageal reflux disease)   ? Decdreased, has decreased coffee intack.  ? Glaucoma   ? HTN (hypertension)   ? Hx of adenomatous colonic polyps 01/23/2016  ? Hyperlipidemia   ? Hypogonadism male   ? Hypothyroidism   ? OSA (obstructive sleep apnea)   ? Has had surgery x 2  ? Parkinsonism ([place]) 01/16/2013  ? no tremors, balance issues only , speech and swallowing difficulties   ? Sleep apnea   ? surgery x 2- no cpap  ? Staph infection 10-2014  ? Thyroid disease   ? HYPOTHYROID  ? ? ?PAST SURGICAL  HISTORY: ?Past Surgical History:  ?Procedure Laterality Date  ? CATARACT EXTRACTION    ? COLONOSCOPY  04-27-2004  ? tics, hems   ? I & D EXTREMITY Right 10/18/2014  ? Procedure: IRRIGATION AND DEBRIDEMENT RIGHT KNEE PREPATELLA BURSA INFECTION;  Surgeon: [name], MD;  Location: [place];  Service: Orthopedics;  Laterality: Right;  ? [place] SURGERY  04-2015  ? LUMBAR LAMINECTOMY WITH COFLEX 1 LEVEL N/A 06/06/2015  ? Procedure: Lumbar three-four Laminectomy/Foraminotomy with placement of Coflex;  Surgeon: [name], MD;  Location: [place] [facility];  Service: Neurosurgery;  Laterality: N/A;  ? NASAL SEPTUM SURGERY    ? TONSILLECTOMY    ? and adenoids  ? UVULOPALATOPHARYNGOPLASTY    ? ? ?FAMILY HISTORY: ?Family History  ?Problem Relation Age of Onset  ? Emphysema Mother   ? COPD Mother   ? Heart disease Mother   ? Hypertension Mother   ? Liver cancer Father   ? Cancer Father   ? Diabetes Father   ? Hypertension Father   ? Colon polyps Father   ? Heart disease Sister   ? COPD Sister   ? Heart attack Sister   ? Hypertension Sister   ? Colon polyps Brother   ? Stroke Neg Hx   ? Colon cancer Neg Hx   ? Parkinson's disease Neg Hx   ? ? ?SOCIAL HISTORY: ?Social History  ? ?Socioeconomic History  ? Marital status: Married  ?  Spouse name: linda  ? Number of children: 2  ? Years of education: college  ? Highest education level: Not on file  ?Occupational History  ? Occupation: branch Librarian, academic  ?  Employer: RETIRED  ?Tobacco Use  ? Smoking status: Never  ? Smokeless tobacco: Never  ?Vaping Use  ? Vaping Use: Never used  ?Substance and Sexual Activity  ? Alcohol use: No  ?  Alcohol/week: 0.0 standard drinks  ? Drug use: No  ? Sexual activity: Not on file  ?Other Topics Concern  ? Not on file  ?Social History Narrative  ? 3 Grandchildren   ? ?Social Determinants of Health  ? ?Financial Resource Strain: Low Risk   ? Difficulty of Paying Living Expenses: Not hard at all  ?Food Insecurity: No Food Insecurity  ? Worried  About Charity fundraiser in the Last Year: Never true  ? Ran Out of Food in the Last Year: Never true  ?Transportation Needs: No Transportation Needs  ? Lack of Transportation (Medical): No  ?  Lack of Transportation (Non-Medical): No  ?Physical Activity: Insufficiently Active  ? Days of Exercise per Week: 5 days  ? Minutes of Exercise per Session: 20 min  ?Stress: No Stress Concern Present  ? Feeling of Stress : Not at all  ?Social Connections: Socially Isolated  ? Frequency of Communication with Friends and Family: Once a week  ? Frequency of Social Gatherings with Friends and Family: Once a week  ? Attends Religious Services: Never  ? Active Member of Clubs or Organizations: No  ? Attends Archivist Meetings: Never  ? Marital Status: Married  ?Intimate Partner Violence: Not At Risk  ? Fear of Current or Ex-Partner: No  ? Emotionally Abused: No  ? Physically Abused: No  ? Sexually Abused: No  ? ? ? ? ?PHYSICAL EXAM ? ?Vitals:  ? 01/22/22 1357  ?BP: 128/71  ?Pulse: (!) 55  ?Weight: 182 lb 6.4 oz (82.7 kg)  ?Height: '5\' 8"'$  (1.727 m)  ? ?Body mass index is 27.73 kg/m?. ? ?Generalized: Well developed, in no acute distress  ? ?Neurological examination  ?Mentation: Alert oriented to time, place, history taking. Follows all commands speech is garbled. ?Cranial nerve II-XII: Pupils were equal round reactive to light. Extraocular movements were full, with the exception decreased vertical gaze. head turning and shoulder shrug  were normal and symmetric. ?Motor: The motor testing reveals 5 over 5 strength of all 4 extremities with exception of bilateral foot drop right worse than left ?Sensory: Sensory testing is intact to soft touch on all 4 extremities. No evidence of extinction is noted.  ?Coordination: Cerebellar testing reveals good finger-nose-finger and heel-to-shin bilaterally.  ?Gait and station: Patient has a foot drop tends to lift the right leg more when ambulating.  Tandem gait not attempted.  Uses a  walker ? ? ?DIAGNOSTIC DATA (LABS, IMAGING, TESTING) ?- I reviewed patient records, labs, notes, testing and imaging myself where available. ? ?Lab Results  ?Component Value Date  ? WBC 7.1 12/27/2021  ? HGB 14.

## 2022-02-07 DIAGNOSIS — Z20822 Contact with and (suspected) exposure to covid-19: Secondary | ICD-10-CM | POA: Diagnosis not present

## 2022-02-19 ENCOUNTER — Ambulatory Visit (INDEPENDENT_AMBULATORY_CARE_PROVIDER_SITE_OTHER): Payer: Medicare Other

## 2022-02-19 DIAGNOSIS — Z0000 Encounter for general adult medical examination without abnormal findings: Secondary | ICD-10-CM | POA: Diagnosis not present

## 2022-02-19 NOTE — Progress Notes (Addendum)
Virtual Visit via Telephone Note ? ?I connected with  [name] on 02/19/22 at  1:45 PM EDT by telephone and verified that I am speaking with the correct person using two identifiers. ? ?Location: ?Patient: home ?Provider: Office  ?Persons participating in the virtual visit: patient/Nurse Health Advisor ?  ?I discussed the limitations, risks, security and privacy concerns of performing an evaluation and management service by telephone and the availability of in person appointments. The patient expressed understanding and agreed to proceed. ? ?Interactive audio and video telecommunications were attempted between this nurse and patient, however failed, due to patient having technical difficulties OR patient did not have access to video capability.  We continued and completed visit with audio only. ? ?Some vital signs may be absent or patient reported.  ? ?Mason Brace, LPN ? ? ?Subjective:  ? [name] is a 76 y.o. male who presents for Medicare Annual/Subsequent preventive examination. ? ?Review of Systems    ? ?Cardiac Risk Factors include: advanced age (>66mn, >>83women);hypertension;dyslipidemia;male gender ? ?   ?Objective:  ?  ?There were no vitals filed for this visit. ?There is no height or weight on file to calculate BMI. ? ? ?  02/19/2022  ?  1:48 PM 02/13/2021  ?  2:02 PM 02/15/2020  ? 12:36 PM 01/11/2020  ?  3:29 PM 01/15/2018  ?  1:30 PM 01/14/2017  ?  2:50 PM 01/09/2017  ? 11:56 AM  ?Advanced Directives  ?Does Patient Have a Medical Advance Directive? Yes Yes Yes Yes Yes Yes Yes  ?Type of AIndustrial/product designerof AWyanetLiving [place] Living Mason;Healthcare Power of [place]  ?Does patient want to make changes to medical advance directive?   No - Patient declined No - Patient declined     ?Copy of HHavanain Chart? Yes - validated  most recent copy scanned in chart (See row information) No - copy requested  No - copy requested     ? ? ?Current Medications (verified) ?Outpatient Encounter Medications as of 02/19/2022  ?Medication Sig  ? aspirin 81 MG tablet Take 81 mg by mouth daily.  ? azelastine (ASTELIN) 0.1 % nasal spray Place 1 spray into both nostrils 2 (two) times daily.  ? Cholecalciferol (VITAMIN D) 2000 units CAPS Take 1 capsule by mouth once a week.   ? fexofenadine (ALLEGRA) 60 MG tablet Take 60 mg by mouth daily.   ? finasteride (PROSCAR) 5 MG tablet Take 5 mg by mouth daily.  ? fluticasone (FLONASE) 50 MCG/ACT nasal spray SPRAY 1 SPRAY INTO EACH NOSTRIL EVERY DAY  ? levothyroxine (SYNTHROID) 50 MCG tablet TAKE 1 TABLET BY MOUTH EVERY DAY  ? Multiple Vitamins-Minerals (MULTIVITAMIN WITH MINERALS) tablet Take 1 tablet by mouth daily.  ? Naproxen Sodium 220 MG CAPS Take by mouth. Once a month prn  ? polyethylene glycol (MIRALAX / GLYCOLAX) 17 g packet Take 17 g by mouth daily.  ? rosuvastatin (CRESTOR) 5 MG tablet TAKE 1 TABLET BY MOUTH EVERY DAY  ? timolol (BETIMOL) 0.5 % ophthalmic solution Place 1 drop into both eyes 2 (two) times daily.   ? ?No facility-administered encounter medications on file as of 02/19/2022.  ? ? ?Allergies (verified) ?Ambien [zolpidem tartrate], Atenolol, Lunesta [eszopiclone], Oxazepam, Requip [ropinirole hcl], and Ziac [bisoprolol-hydrochlorothiazide]  ? ?History: ?Past Medical History:  ?Diagnosis Date  ? Allergic rhinitis   ? Allergy   ?  Arthritis   ? At risk for falls   ? Cancer [facility])   ? basal cell skin cancer  ? Cataract   ? Complication of anesthesia   ? Slow to awaken  ? Constipation   ? DDD (degenerative disc disease), cervical   ? Dysphagia   ? GERD (gastroesophageal reflux disease)   ? Decdreased, has decreased coffee intack.  ? Glaucoma   ? HTN (hypertension)   ? Hx of adenomatous colonic polyps 01/23/2016  ? Hyperlipidemia   ? Hypogonadism male   ? Hypothyroidism   ? OSA (obstructive sleep apnea)    ? Has had surgery x 2  ? Parkinsonism ([place]) 01/16/2013  ? no tremors, balance issues only , speech and swallowing difficulties   ? Sleep apnea   ? surgery x 2- no cpap  ? Staph infection 10-2014  ? Thyroid disease   ? HYPOTHYROID  ? ?Past Surgical History:  ?Procedure Laterality Date  ? CATARACT EXTRACTION    ? COLONOSCOPY  04-27-2004  ? tics, hems   ? I & D EXTREMITY Right 10/18/2014  ? Procedure: IRRIGATION AND DEBRIDEMENT RIGHT KNEE PREPATELLA BURSA INFECTION;  Surgeon: Mason Minion, MD;  Location: [place];  Service: Orthopedics;  Laterality: Right;  ? [place] SURGERY  04-2015  ? LUMBAR LAMINECTOMY WITH COFLEX 1 LEVEL N/A 06/06/2015  ? Procedure: Lumbar three-four Laminectomy/Foraminotomy with placement of Coflex;  Surgeon: Mason Miss, MD;  Location: [place] [facility];  Service: Neurosurgery;  Laterality: N/A;  ? NASAL SEPTUM SURGERY    ? TONSILLECTOMY    ? and adenoids  ? UVULOPALATOPHARYNGOPLASTY    ? ?Family History  ?Problem Relation Age of Onset  ? Emphysema Mother   ? COPD Mother   ? Heart disease Mother   ? Hypertension Mother   ? Liver cancer Father   ? Cancer Father   ? Diabetes Father   ? Hypertension Father   ? Colon polyps Father   ? Heart disease Sister   ? COPD Sister   ? Heart attack Sister   ? Hypertension Sister   ? Colon polyps Brother   ? Stroke Neg Hx   ? Colon cancer Neg Hx   ? Parkinson's disease Neg Hx   ? ?Social History  ? ?Socioeconomic History  ? Marital status: Married  ?  Spouse name: [name]  ? Number of children: 2  ? Years of education: college  ? Highest education level: Not on file  ?Occupational History  ? Occupation: branch Librarian, academic  ?  Employer: RETIRED  ?Tobacco Use  ? Smoking status: Never  ? Smokeless tobacco: Never  ?Vaping Use  ? Vaping Use: Never used  ?Substance and Sexual Activity  ? Alcohol use: No  ?  Alcohol/week: 0.0 standard drinks  ? Drug use: No  ? Sexual activity: Not on file  ?Other Topics Concern  ? Not on file  ?Social History Narrative  ? 3 Grandchildren    ? ?Social Determinants of Health  ? ?Financial Resource Strain: Low Risk   ? Difficulty of Paying Living Expenses: Not hard at all  ?Food Insecurity: No Food Insecurity  ? Worried About Charity fundraiser in the Last Year: Never true  ? Ran Out of Food in the Last Year: Never true  ?Transportation Needs: No Transportation Needs  ? Lack of Transportation (Medical): No  ? Lack of Transportation (Non-Medical): No  ?Physical Activity: Sufficiently Active  ? Days of Exercise per Week: 7 days  ? Minutes  of Exercise per Session: 30 min  ?Stress: No Stress Concern Present  ? Feeling of Stress : Not at all  ?Social Connections: Moderately Isolated  ? Frequency of Communication with Friends and Family: Twice a week  ? Frequency of Social Gatherings with Friends and Family: Twice a week  ? Attends Religious Services: Never  ? Active Member of Clubs or Organizations: No  ? Attends Archivist Meetings: Never  ? Marital Status: Married  ? ? ?Tobacco Counseling ?Counseling given: Not Answered ? ? ?Clinical Intake: ? ?Pre-visit preparation completed: Yes ? ?Pain : No/denies pain ? ?  ? ?BMI - recorded: 27.74 ?Nutritional Status: BMI 25 -29 Overweight ?Nutritional Risks: None ?Diabetes: No ? ?How often do you need to have someone help you when you read instructions, pamphlets, or other written materials from your doctor or pharmacy?: 1 - Never ? ?Diabetic?no ? ?Interpreter Needed?: No ? ?Information entered by :: Charlott Rakes, LPN ? ? ?Activities of Daily Living ? ?  02/19/2022  ?  1:50 PM  ?In your present state of health, do you have any difficulty performing the following activities:  ?Hearing? 0  ?Vision? 0  ?Difficulty concentrating or making decisions? 0  ?Walking or climbing stairs? 0  ?Comment don't use  ?Dressing or bathing? 1  ?Comment wife assist  ?Doing errands, shopping? 1  ?Comment wife assist  ?Preparing Food and eating ? Y  ?Comment wife assist  ?Using the Toilet? N  ?In the past six months, have you  accidently leaked urine? Y  ?Comment wears a pad  ?Do you have problems with loss of bowel control? Y  ?Managing your Medications? Y  ?Comment wife assist  ?Managing your Finances? Y  ?Housekeeping or

## 2022-02-19 NOTE — Patient Instructions (Addendum)
[name] , ?Thank you for taking time to come for your Medicare Wellness Visit. I appreciate your ongoing commitment to your health goals. Please review the following plan we discussed and let me know if I can assist you in the future.  ? ?Screening recommendations/referrals: ?Colonoscopy: No Longer required  ?Recommended yearly ophthalmology/optometry visit for glaucoma screening and checkup ?Recommended yearly dental visit for hygiene and checkup ? ?Vaccinations: ?Influenza vaccine: Done 07/04/21 repeat every year  ?Pneumococcal vaccine: Up to date ?Tdap vaccine: Done 07/14/19 repeat every 10 years  ?Shingles vaccine: Shingrix discussed. Please contact your pharmacy for coverage information.    ?Covid-19: Completed 10/27, 02/26/20 & 07/14/21 ? ?Advanced directives: copies in chart  ? ?Conditions/risks identified: celebrate 49 th bday  ? ?Next appointment: Follow up in one year for your annual wellness visit.  ? ?Preventive Care 75 Years and Older, Male ?Preventive care refers to lifestyle choices and visits with your health care provider that can promote health and wellness. ?What does preventive care include? ?A yearly physical exam. This is also called an annual well check. ?Dental exams once or twice a year. ?Routine eye exams. Ask your health care provider how often you should have your eyes checked. ?Personal lifestyle choices, including: ?Daily care of your teeth and gums. ?Regular physical activity. ?Eating a healthy diet. ?Avoiding tobacco and drug use. ?Limiting alcohol use. ?Practicing safe sex. ?Taking low doses of aspirin every day. ?Taking vitamin and mineral supplements as recommended by your health care provider. ?What happens during an annual well check? ?The services and screenings done by your health care provider during your annual well check will depend on your age, overall health, lifestyle risk factors, and family history of disease. ?Counseling  ?Your health care provider may ask you questions  about your: ?Alcohol use. ?Tobacco use. ?Drug use. ?Emotional well-being. ?Home and relationship well-being. ?Sexual activity. ?Eating habits. ?History of falls. ?Memory and ability to understand (cognition). ?Work and work Statistician. ?Screening  ?You may have the following tests or measurements: ?Height, weight, and BMI. ?Blood pressure. ?Lipid and cholesterol levels. These may be checked every 5 years, or more frequently if you are over 45 years old. ?Skin check. ?Lung cancer screening. You may have this screening every year starting at age 80 if you have a 30-pack-year history of smoking and currently smoke or have quit within the past 15 years. ?Fecal occult blood test (FOBT) of the stool. You may have this test every year starting at age 87. ?Flexible sigmoidoscopy or colonoscopy. You may have a sigmoidoscopy every 5 years or a colonoscopy every 10 years starting at age 34. ?Prostate cancer screening. Recommendations will vary depending on your family history and other risks. ?Hepatitis C blood test. ?Hepatitis B blood test. ?Sexually transmitted disease (STD) testing. ?Diabetes screening. This is done by checking your blood sugar (glucose) after you have not eaten for a while (fasting). You may have this done every 1-3 years. ?Abdominal aortic aneurysm (AAA) screening. You may need this if you are a current or former smoker. ?Osteoporosis. You may be screened starting at age 62 if you are at high risk. ?Talk with your health care provider about your test results, treatment options, and if necessary, the need for more tests. ?Vaccines  ?Your health care provider may recommend certain vaccines, such as: ?Influenza vaccine. This is recommended every year. ?Tetanus, diphtheria, and acellular pertussis (Tdap, Td) vaccine. You may need a Td booster every 10 years. ?Zoster vaccine. You may need this after age  60. ?Pneumococcal 13-valent conjugate (PCV13) vaccine. One dose is recommended after age 27. ?Pneumococcal  polysaccharide (PPSV23) vaccine. One dose is recommended after age 23. ?Talk to your health care provider about which screenings and vaccines you need and how often you need them. ?This information is not intended to replace advice given to you by your health care provider. Make sure you discuss any questions you have with your health care provider. ?Document Released: 10/21/2015 Document Revised: 06/13/2016 Document Reviewed: 07/26/2015 ?Elsevier Interactive Patient Education ? 2017 [place]. ? ?Fall Prevention in the Home ?Falls can cause injuries. They can happen to people of all ages. There are many things you can do to make your home safe and to help prevent falls. ?What can I do on the outside of my home? ?Regularly fix the edges of walkways and driveways and fix any cracks. ?Remove anything that might make you trip as you walk through a door, such as a raised step or threshold. ?Trim any bushes or trees on the path to your home. ?Use bright outdoor lighting. ?Clear any walking paths of anything that might make someone trip, such as rocks or tools. ?Regularly check to see if handrails are loose or broken. Make sure that both sides of any steps have handrails. ?Any raised decks and porches should have guardrails on the edges. ?Have any leaves, snow, or ice cleared regularly. ?Use sand or salt on walking paths during winter. ?Clean up any spills in your garage right away. This includes oil or grease spills. ?What can I do in the bathroom? ?Use night lights. ?Install grab bars by the toilet and in the tub and shower. Do not use towel bars as grab bars. ?Use non-skid mats or decals in the tub or shower. ?If you need to sit down in the shower, use a plastic, non-slip stool. ?Keep the floor dry. Clean up any water that spills on the floor as soon as it happens. ?Remove soap buildup in the tub or shower regularly. ?Attach bath mats securely with double-sided non-slip rug tape. ?Do not have throw rugs and other  things on the floor that can make you trip. ?What can I do in the bedroom? ?Use night lights. ?Make sure that you have a light by your bed that is easy to reach. ?Do not use any sheets or blankets that are too big for your bed. They should not hang down onto the floor. ?Have a firm chair that has side arms. You can use this for support while you get dressed. ?Do not have throw rugs and other things on the floor that can make you trip. ?What can I do in the kitchen? ?Clean up any spills right away. ?Avoid walking on wet floors. ?Keep items that you use a lot in easy-to-reach places. ?If you need to reach something above you, use a strong step stool that has a grab bar. ?Keep electrical cords out of the way. ?Do not use floor polish or wax that makes floors slippery. If you must use wax, use non-skid floor wax. ?Do not have throw rugs and other things on the floor that can make you trip. ?What can I do with my stairs? ?Do not leave any items on the stairs. ?Make sure that there are handrails on both sides of the stairs and use them. Fix handrails that are broken or loose. Make sure that handrails are as long as the stairways. ?Check any carpeting to make sure that it is firmly attached to the stairs. Fix  any carpet that is loose or worn. ?Avoid having throw rugs at the top or bottom of the stairs. If you do have throw rugs, attach them to the floor with carpet tape. ?Make sure that you have a light switch at the top of the stairs and the bottom of the stairs. If you do not have them, ask someone to add them for you. ?What else can I do to help prevent falls? ?Wear shoes that: ?Do not have high heels. ?Have rubber bottoms. ?Are comfortable and fit you well. ?Are closed at the toe. Do not wear sandals. ?If you use a stepladder: ?Make sure that it is fully opened. Do not climb a closed stepladder. ?Make sure that both sides of the stepladder are locked into place. ?Ask someone to hold it for you, if possible. ?Clearly  mark and make sure that you can see: ?Any grab bars or handrails. ?First and last steps. ?Where the edge of each step is. ?Use tools that help you move around (mobility aids) if they are needed. These inc

## 2022-02-26 ENCOUNTER — Other Ambulatory Visit: Payer: Self-pay | Admitting: Family Medicine

## 2022-03-16 DIAGNOSIS — H401132 Primary open-angle glaucoma, bilateral, moderate stage: Secondary | ICD-10-CM | POA: Diagnosis not present

## 2022-04-11 ENCOUNTER — Other Ambulatory Visit: Payer: Self-pay | Admitting: Family Medicine

## 2022-04-11 DIAGNOSIS — J302 Other seasonal allergic rhinitis: Secondary | ICD-10-CM

## 2022-04-24 ENCOUNTER — Other Ambulatory Visit: Payer: Self-pay | Admitting: Family Medicine

## 2022-05-07 NOTE — Progress Notes (Unsigned)
Cardiology Office Note    Date:  05/09/2022   ID:  [name], DOB [birth year], MRN [id number]   PCP:  [name], [place]  Cardiologist:  Mason Him, Schaefer   Advanced Practice Provider:  No care team member to display Electrophysiologist:  None   [id number]   Chief Complaint  Patient presents with   Follow-up    History of Present Illness:  [name] is a 76 y.o. male with a hx of HTN, OSA, Parkinson's, hyperlipidemia, pre- diabetes, asymmetric septal hypertrophy c/w HOCM with no outflow tact gradient and hypothyroidism. He was seen by Neuro at [facility], his Losartan was stopped due to soft BPs. Recent 2D echo showed moderately BSH at 55m with no SAM and no LVOT obstruction and trivial MR.   Patient last saw Dr. TRadford Pax4/2022 and decided not to pursue cardiac MRI given advanced Parkinson's.  Patient comes in with his wife. BP always runs low now. No high BP's, no chest pain, dyspnea, edema.? Dizziness. Not on any meds for BP or HR. Probably not drinking enough water.    Past Medical History:  Diagnosis Date   Allergic rhinitis    Allergy    Arthritis    At risk for falls    Cancer (HCC)    basal cell skin cancer   Cataract    Complication of anesthesia    Slow to awaken   Constipation    DDD (degenerative disc disease), cervical    Dysphagia    GERD (gastroesophageal reflux disease)    Decdreased, has decreased coffee intack.   Glaucoma    HTN (hypertension)    Hx of adenomatous colonic polyps 01/23/2016   Hyperlipidemia    Hypogonadism male    Hypothyroidism    OSA (obstructive sleep apnea)    Has had surgery x 2   [place] 01/16/2013   no tremors, balance issues only , speech and swallowing difficulties    Sleep apnea    surgery x 2- no cpap   Staph infection 10-2014   Thyroid disease    HYPOTHYROID    Past Surgical History:  Procedure Laterality Date   CATARACT EXTRACTION     COLONOSCOPY  04-27-2004    tics, hems    I & D EXTREMITY Right 10/18/2014   Procedure: IRRIGATION AND DEBRIDEMENT RIGHT KNEE PREPATELLA BURSA INFECTION;  Surgeon: [name];  [facility]  Service: Orthopedics;  Laterality: Right;   LUMBAR DISC SURGERY  04-2015   LUMBAR LAMINECTOMY WITH COFLEX 1 LEVEL N/A 06/06/2015   Procedure: Lumbar three-four Laminectomy/Foraminotomy with placement of Coflex;  Surgeon: [name];  [facility];  Service: Neurosurgery;  Laterality: N/A;   NASAL SEPTUM SURGERY     TONSILLECTOMY     and adenoids   UVULOPALATOPHARYNGOPLASTY      Current Medications: Current Meds  Medication Sig   aspirin 81 MG tablet Take 81 mg by mouth daily.   Azelastine HCl 137 MCG/SPRAY SOLN PLACE 1 SPRAY INTO BOTH NOSTRILS 2 (TWO) TIMES DAILY   Cholecalciferol (VITAMIN D) 2000 units CAPS Take 1 capsule by mouth once a week.    fexofenadine (ALLEGRA) 60 MG tablet Take 60 mg by mouth daily.    finasteride (PROSCAR) 5 MG tablet Take 5 mg by mouth daily.   fluticasone (FLONASE) 50 MCG/ACT nasal spray SPRAY 1 SPRAY INTO EACH NOSTRIL EVERY DAY   levothyroxine (SYNTHROID) 50 MCG tablet TAKE 1 TABLET BY  MOUTH EVERY DAY   Multiple Vitamins-Minerals (MULTIVITAMIN WITH MINERALS) tablet Take 1 tablet by mouth daily.   Naproxen Sodium 220 MG CAPS Take by mouth. Once a month prn   polyethylene glycol (MIRALAX / GLYCOLAX) 17 g packet Take 17 g by mouth daily.   rosuvastatin (CRESTOR) 5 MG tablet TAKE 1 TABLET BY MOUTH EVERY DAY   timolol (BETIMOL) 0.5 % ophthalmic solution Place 1 drop into both eyes 2 (two) times daily.      Allergies:   Ambien [zolpidem tartrate], Atenolol, Lunesta [eszopiclone], Oxazepam, Requip [ropinirole hcl], and Ziac [bisoprolol-hydrochlorothiazide]   Social History   Socioeconomic History   Marital status: Married    Spouse name: linda   Number of children: 2   Years of education: college   Highest education level: Not on file  Occupational History   Occupation:  branch Scientist, product/process development: RETIRED  Tobacco Use   Smoking status: Never   Smokeless tobacco: Never  Vaping Use   Vaping Use: Never used  Substance and Sexual Activity   Alcohol use: No    Alcohol/week: 0.0 standard drinks of alcohol   Drug use: No   Sexual activity: Not on file  Other Topics Concern   Not on file  Social History Narrative   3 Grandchildren    Social Determinants of Health   Financial Resource Strain: Low Risk  (02/19/2022)   Overall Financial Resource Strain (CARDIA)    Difficulty of Paying Living Expenses: Not hard at all  Food Insecurity: No Food Insecurity (02/19/2022)   Hunger Vital Sign    Worried About Running Out of Food in the Last Year: Never true    Ran Out of Food in the Last Year: Never true  Transportation Needs: No Transportation Needs (02/19/2022)   PRAPARE - Hydrologist (Medical): No    Lack of Transportation (Non-Medical): No  Physical Activity: Sufficiently Active (02/19/2022)   Exercise Vital Sign    Days of Exercise per Week: 7 days    Minutes of Exercise per Session: 30 min  Stress: No Stress Concern Present (02/19/2022)   [place]    Feeling of Stress : Not at all  Social Connections: Moderately Isolated (02/19/2022)   Social Connection and Isolation Panel [NHANES]    Frequency of Communication with Friends and Family: Twice a week    Frequency of Social Gatherings with Friends and Family: Twice a week    Attends Religious Services: Never    Marine scientist or Organizations: No    Attends Music therapist: Never    Marital Status: Married     Family History:  The patient's  family history includes COPD in his mother and sister; Cancer in his father; Colon polyps in his brother and father; Diabetes in his father; Emphysema in his mother; Heart attack in his sister; Heart disease in his mother and sister;  Hypertension in his father, mother, and sister; Liver cancer in his father.   ROS:   Please see the history of present illness.    ROS All other systems reviewed and are negative.   PHYSICAL EXAM:   VS:  BP 102/60 (BP Location: Left Arm, Patient Position: Sitting, Cuff Size: Normal)   Pulse (!) 51   Ht '5\' 8"'$  (1.727 m)   Wt 183 lb (83 kg)   BMI 27.83 kg/m   Physical Exam  GEN: Well nourished, well developed, in  no acute distress  Neck: no JVD, carotid bruits, or masses Cardiac:RRR; 2/9/ systolic murmur Respiratory:  clear to auscultation bilaterally, normal work of breathing GI: soft, nontender, nondistended, + BS Ext: without cyanosis, clubbing, or edema, Good distal pulses bilaterally Neuro:  Alert and Oriented x 3, Psych: euthymic mood, full affect  Wt Readings from Last 3 Encounters:  05/09/22 183 lb (83 kg)  01/22/22 182 lb 6.4 oz (82.7 kg)  12/27/21 184 lb (83.5 kg)      Studies/Labs Reviewed:   EKG:  EKG is  ordered today.  The ekg ordered today demonstrates sinus bradycardia 51/m  Recent Labs: 12/27/2021: ALT 21; BUN 13; Creatinine, Ser 0.66; Hemoglobin 14.7; Platelets 203.0; Potassium 4.5; Sodium 142; TSH 1.93   Lipid Panel    Component Value Date/Time   CHOL 122 12/27/2021 1330   TRIG 200.0 (H) 12/27/2021 1330   HDL 40.50 12/27/2021 1330   CHOLHDL 3 12/27/2021 1330   VLDL 40.0 12/27/2021 1330   LDLCALC 41 12/27/2021 1330   LDLCALC 57 09/23/2020 1356   LDLDIRECT 107.0 02/18/2018 1100    Additional studies/ records that were reviewed today include:  Echo 06/21/20 IMPRESSIONS     1. Moderate basal septal hypertrophy up to 15 mm. No SAM of the mitral  valve. No LVOT obstruction. Left ventricular ejection fraction, by  estimation, is 60 to 65%. The left ventricle has normal function. The left  ventricle has no regional wall motion  abnormalities. There is moderate asymmetric left ventricular hypertrophy  of the basal-septal segment. Left ventricular  diastolic parameters are  consistent with Grade I diastolic dysfunction (impaired relaxation).   2. Right ventricular systolic function is normal. The right ventricular  size is normal. Tricuspid regurgitation signal is inadequate for assessing  PA pressure.   3. The mitral valve is grossly normal. Trivial mitral valve  regurgitation. No evidence of mitral stenosis.   4. The aortic valve is tricuspid. Aortic valve regurgitation is not  visualized. No aortic stenosis is present.   5. The inferior vena cava is normal in size with greater than 50%  respiratory variability, suggesting right atrial pressure of 3 mmHg.   Comparison(s): No significant change from prior study.    Risk Assessment/Calculations:         ASSESSMENT:    1. HOCM (hypertrophic obstructive cardiomyopathy) ([place])   2. Dysautonomia ([place])   3. Edema leg      PLAN:  In order of problems listed above:   HOCM  - ? Dx >> echo 2018 showed moderate BSH with normal LVF and no LVOT obstruction or SAM.   -He has not had a cardiac MRI but given advanced Parkinson's would not pursue Cardiac MRI at this time.  -repeat 2D echo 9/21 showed moderate BSH with normal LVF and no LVOT obstruction or SAM -he denies any dizziness or syncope   -He has no SOB or chest pain    Autonomic dysregulation -BPs have been soft  likely related to autonomic dysregulation from his Parkinsons -continue compression hose daily -increase water intake -his wife has been taking care of Schaefer for > 7 yrs    Chronic LE edema  -he has no edema on exam -this is well controlled with compression hose.         Shared Decision Making/Informed Consent        Medication Adjustments/Labs and Tests Ordered: Current medicines are reviewed at length with the patient today.  Concerns regarding medicines are outlined above.  Medication changes, Labs  and Tests ordered today are listed in the Patient Instructions below. Patient Instructions  Medication  Instructions:  Your physician recommends that you continue on your current medications as directed. Please refer to the Current Medication list given to you today.  *If you need a refill on your cardiac medications before your next appointment, please call your pharmacy*  Lab Work: If you have labs (blood work) drawn today and your tests are completely normal, you will receive your results only by: [place] (if you have MyChart) OR A paper copy in the mail If you have any lab test that is abnormal or we need to change your treatment, we will call you to review the results.  Testing/Procedures: None ordered today.  Follow-Up: At [facility] you and your health needs are our priority.  As part of our continuing mission to provide you with exceptional heart care, we have created designated Provider Care Teams.  These Care Teams include your primary Cardiologist (physician) and Advanced Practice Providers (APPs -  Physician Assistants and Nurse Practitioners) who all work together to provide you with the care you need, when you need it.  We recommend signing up for the patient portal called "MyChart".  Sign up information is provided on this After Visit Summary.  MyChart is used to connect with patients for Virtual Visits (Telemedicine).  Patients are able to view lab/test results, encounter notes, upcoming appointments, etc.  Non-urgent messages can be sent to your provider as well.   To learn more about what you can do with MyChart, go to [email or web address].    Your next appointment:   1 year(s)  The format for your next appointment:   In Person  Provider:   [name], Schaefer {   Important Information About Sugar         Weston Brass Ermalinda Barrios, PA-C  05/09/2022 1:15 PM    [address], [place], [place]  [address] Phone: [phone]; Fax: [phone]

## 2022-05-09 ENCOUNTER — Ambulatory Visit (INDEPENDENT_AMBULATORY_CARE_PROVIDER_SITE_OTHER): Payer: Medicare Other | Admitting: Physician Assistant

## 2022-05-09 ENCOUNTER — Encounter: Payer: Self-pay | Admitting: Physician Assistant

## 2022-05-09 VITALS — BP 102/60 | HR 51 | Ht 68.0 in | Wt 183.0 lb

## 2022-05-09 DIAGNOSIS — G901 Familial dysautonomia [Riley-Day]: Secondary | ICD-10-CM

## 2022-05-09 DIAGNOSIS — R600 Localized edema: Secondary | ICD-10-CM | POA: Diagnosis not present

## 2022-05-09 DIAGNOSIS — I421 Obstructive hypertrophic cardiomyopathy: Secondary | ICD-10-CM

## 2022-05-09 DIAGNOSIS — I10 Essential (primary) hypertension: Secondary | ICD-10-CM

## 2022-05-09 NOTE — Patient Instructions (Signed)
Medication Instructions:  Your physician recommends that you continue on your current medications as directed. Please refer to the Current Medication list given to you today.  *If you need a refill on your cardiac medications before your next appointment, please call your pharmacy*  Lab Work: If you have labs (blood work) drawn today and your tests are completely normal, you will receive your results only by: [place] (if you have MyChart) OR A paper copy in the mail If you have any lab test that is abnormal or we need to change your treatment, we will call you to review the results.  Testing/Procedures: None ordered today.  Follow-Up: At [facility], you and your health needs are our priority.  As part of our continuing mission to provide you with exceptional heart care, we have created designated Provider Care Teams.  These Care Teams include your primary Cardiologist (physician) and Advanced Practice Providers (APPs -  Physician Assistants and Nurse Practitioners) who all work together to provide you with the care you need, when you need it.  We recommend signing up for the patient portal called "MyChart".  Sign up information is provided on this After Visit Summary.  MyChart is used to connect with patients for Virtual Visits (Telemedicine).  Patients are able to view lab/test results, encounter notes, upcoming appointments, etc.  Non-urgent messages can be sent to your provider as well.   To learn more about what you can do with MyChart, go to [email or web address].    Your next appointment:   1 year(s)  The format for your next appointment:   In Person  Provider:   [name], MD {   Important Information About [place]

## 2022-06-29 ENCOUNTER — Ambulatory Visit (INDEPENDENT_AMBULATORY_CARE_PROVIDER_SITE_OTHER): Payer: Medicare Other | Admitting: Family

## 2022-06-29 ENCOUNTER — Encounter: Payer: Self-pay | Admitting: Family

## 2022-06-29 VITALS — BP 128/75 | HR 55 | Temp 98.2°F | Ht 68.0 in | Wt 179.6 lb

## 2022-06-29 DIAGNOSIS — R051 Acute cough: Secondary | ICD-10-CM

## 2022-06-29 MED ORDER — AZITHROMYCIN 250 MG PO TABS
ORAL_TABLET | ORAL | 0 refills | Status: AC
Start: 2022-06-29 — End: 2022-07-04

## 2022-06-29 MED ORDER — PREDNISONE 10 MG PO TABS
ORAL_TABLET | ORAL | 0 refills | Status: DC
Start: 2022-06-29 — End: 2023-01-09

## 2022-06-29 NOTE — Progress Notes (Signed)
Patient ID: [name], male    DOB: 08-28-1946, 76 y.o.   MRN: [id number]  Chief Complaint  Patient presents with  . Sinus Problem    Pt c/o chest congestion, cough, wheezing and runny nose, Present since 2 weeks but has been getting worse. Has tried allegra, Flonase, mucinex which has not helped.     HPI:      Persistent cough:  Pt c/o chest congestion, cough, wheezing and runny nose, Present for 2 weeks but has been getting worse. Has tried allegra, Flonase, mucinex which has not helped. Pt wife present and states pt has PSP and has a decreased ability to clear his secretions, she can hear him gurgling and wheezing.     Assessment & Plan:  1. Acute cough *** - azithromycin (ZITHROMAX) 250 MG tablet; Take 2 tablets on day 1, then 1 tablet daily on days 2 through 5  Dispense: 6 tablet; Refill: 0 - predniSONE (DELTASONE) 10 MG tablet; Take in the morning after eating  - 6 tab day 1, 5 tab day 2, 4 tab day 3-4, 3 tab day 5, 2 tab day 6  Dispense: 26 tablet; Refill: 0    Subjective:    Outpatient Medications Prior to Visit  Medication Sig Dispense Refill  . aspirin 81 MG tablet Take 81 mg by mouth daily.    . Azelastine HCl 137 MCG/SPRAY SOLN PLACE 1 SPRAY INTO BOTH NOSTRILS 2 (TWO) TIMES DAILY 30 mL 11  . Cholecalciferol (VITAMIN D) 2000 units CAPS Take 1 capsule by mouth once a week.     . fexofenadine (ALLEGRA) 60 MG tablet Take 60 mg by mouth daily.     . finasteride (PROSCAR) 5 MG tablet Take 5 mg by mouth daily.  11  . fluticasone (FLONASE) 50 MCG/ACT nasal spray SPRAY 1 SPRAY INTO EACH NOSTRIL EVERY DAY 48 mL 2  . levothyroxine (SYNTHROID) 50 MCG tablet TAKE 1 TABLET BY MOUTH EVERY DAY 90 tablet 3  . Multiple Vitamins-Minerals (MULTIVITAMIN WITH MINERALS) tablet Take 1 tablet by mouth daily.    . Naproxen Sodium 220 MG CAPS Take by mouth. Once a month prn    . polyethylene glycol (MIRALAX / GLYCOLAX) 17 g packet Take 17 g by mouth daily.    . rosuvastatin (CRESTOR) 5 MG  tablet TAKE 1 TABLET BY MOUTH EVERY DAY 90 tablet 3  . timolol (BETIMOL) 0.5 % ophthalmic solution Place 1 drop into both eyes 2 (two) times daily.      No facility-administered medications prior to visit.   Past Medical History:  Diagnosis Date  . Allergic rhinitis   . Allergy   . Arthritis   . At risk for falls   . Cancer ([place])    basal cell skin cancer  . Cataract   . Complication of anesthesia    Slow to awaken  . Constipation   . DDD (degenerative disc disease), cervical   . Dysphagia   . GERD (gastroesophageal reflux disease)    Decdreased, has decreased coffee intack.  . Glaucoma   . HTN (hypertension)   . Hx of adenomatous colonic polyps 01/23/2016  . Hyperlipidemia   . Hypogonadism male   . Hypothyroidism   . OSA (obstructive sleep apnea)    Has had surgery x 2  . Parkinsonism ([place]) 01/16/2013   no tremors, balance issues only , speech and swallowing difficulties   . Sleep apnea    surgery x 2- no cpap  . Staph infection 10-2014  .  Thyroid disease    HYPOTHYROID   Past Surgical History:  Procedure Laterality Date  . CATARACT EXTRACTION    . COLONOSCOPY  04-27-2004   tics, hems   . I & D EXTREMITY Right 10/18/2014   Procedure: IRRIGATION AND DEBRIDEMENT RIGHT KNEE PREPATELLA BURSA INFECTION;  Surgeon: [name], MD;  Location: [facility] [place];  Service: Orthopedics;  Laterality: Right;  . [place] SURGERY  04-2015  . LUMBAR LAMINECTOMY WITH COFLEX 1 LEVEL N/A 06/06/2015   Procedure: Lumbar three-four Laminectomy/Foraminotomy with placement of Coflex;  Surgeon: [name], MD;  Location: [place] [facility];  Service: Neurosurgery;  Laterality: N/A;  . NASAL SEPTUM SURGERY    . TONSILLECTOMY     and adenoids  . UVULOPALATOPHARYNGOPLASTY     Allergies  Allergen Reactions  . Ambien [Zolpidem Tartrate]     Unknown- balance  . Atenolol     unknown  . Lunesta [Eszopiclone]     unknown  . Oxazepam     unknown  . Requip [Ropinirole Hcl]     unknown  . Ziac  [Bisoprolol-Hydrochlorothiazide]     unknown      Objective:    Physical Exam Vitals and nursing note reviewed.  Constitutional:      General: He is not in acute distress.    Appearance: Normal appearance.  HENT:     Head: Normocephalic.  Cardiovascular:     Rate and Rhythm: Normal rate and regular rhythm.  Pulmonary:     Effort: Pulmonary effort is normal.     Breath sounds: Normal breath sounds.  Musculoskeletal:        General: Normal range of motion.     Cervical back: Normal range of motion.  Skin:    General: Skin is warm and dry.  Neurological:     Mental Status: He is alert and oriented to person, place, and time.  Psychiatric:        Mood and Affect: Mood normal.   BP 128/75 (BP Location: Left Arm, Patient Position: Sitting, Cuff Size: Large)   Pulse (!) 55   Temp 98.2 F (36.8 C) (Temporal)   Ht '5\' 8"'$  (1.727 m)   Wt 179 lb 9.6 oz (81.5 kg)   SpO2 98%   BMI 27.31 kg/m  Wt Readings from Last 3 Encounters:  06/29/22 179 lb 9.6 oz (81.5 kg)  05/09/22 183 lb (83 kg)  01/22/22 182 lb 6.4 oz (82.7 kg)       Jeanie Sewer, NP

## 2022-07-01 NOTE — Patient Instructions (Signed)
It was very nice to see you today!   Increase the Flonase nasal spray and Allegra to twice a day for the next week. I have sent over a Zpack antibiotic and prednisone steroid pack. Continue to offer/increase fluids to 2 liters per day to thin his mucus. Ask Neurology about home health/palliative/hospice care orders.  Call back next week if symptoms are not improving.      PLEASE NOTE:  If you had any lab tests please let us know if you have not heard back within a few days. You may see your results on MyChart before we have a chance to review them but we will give you a call once they are reviewed by [place]. If we ordered any referrals today, please let us know if you have not heard from their office within the next week.

## 2022-07-02 ENCOUNTER — Encounter: Payer: Self-pay | Admitting: *Deleted

## 2022-07-07 DIAGNOSIS — Z23 Encounter for immunization: Secondary | ICD-10-CM | POA: Diagnosis not present

## 2022-07-24 ENCOUNTER — Ambulatory Visit (INDEPENDENT_AMBULATORY_CARE_PROVIDER_SITE_OTHER): Payer: Medicare Other | Admitting: Neurology

## 2022-07-24 ENCOUNTER — Telehealth: Payer: Self-pay | Admitting: Neurology

## 2022-07-24 ENCOUNTER — Encounter: Payer: Self-pay | Admitting: Neurology

## 2022-07-24 VITALS — BP 132/69 | HR 54 | Ht 70.0 in | Wt 179.2 lb

## 2022-07-24 DIAGNOSIS — Z9181 History of falling: Secondary | ICD-10-CM

## 2022-07-24 DIAGNOSIS — R1310 Dysphagia, unspecified: Secondary | ICD-10-CM

## 2022-07-24 DIAGNOSIS — G231 Progressive supranuclear ophthalmoplegia [Steele-Richardson-Olszewski]: Secondary | ICD-10-CM

## 2022-07-24 DIAGNOSIS — Z7409 Other reduced mobility: Secondary | ICD-10-CM | POA: Diagnosis not present
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
## 2022-07-24 NOTE — Telephone Encounter (Signed)
[facility] is taking this patient

## 2022-07-24 NOTE — Patient Instructions (Signed)
It was nice to see you again today.  I have placed a referral to home health therapy through [facility].  They should be in touch in the next week or so.  If you do not hear from [facility], let us know.  Please follow-up to see [name] in about 6 months, sooner if needed.

## 2022-08-02 DIAGNOSIS — R3914 Feeling of incomplete bladder emptying: Secondary | ICD-10-CM | POA: Diagnosis not present

## 2022-08-02 DIAGNOSIS — N130 Hydronephrosis with ureteropelvic junction obstruction: Secondary | ICD-10-CM | POA: Diagnosis not present

## 2022-08-03 DIAGNOSIS — N39498 Other specified urinary incontinence: Secondary | ICD-10-CM | POA: Diagnosis not present

## 2022-08-03 DIAGNOSIS — M48061 Spinal stenosis, lumbar region without neurogenic claudication: Secondary | ICD-10-CM | POA: Diagnosis not present

## 2022-08-03 DIAGNOSIS — N401 Enlarged prostate with lower urinary tract symptoms: Secondary | ICD-10-CM | POA: Diagnosis not present

## 2022-08-03 DIAGNOSIS — K219 Gastro-esophageal reflux disease without esophagitis: Secondary | ICD-10-CM | POA: Diagnosis not present

## 2022-08-03 DIAGNOSIS — Z85828 Personal history of other malignant neoplasm of skin: Secondary | ICD-10-CM | POA: Diagnosis not present

## 2022-08-03 DIAGNOSIS — G629 Polyneuropathy, unspecified: Secondary | ICD-10-CM | POA: Diagnosis not present

## 2022-08-03 DIAGNOSIS — R1310 Dysphagia, unspecified: Secondary | ICD-10-CM | POA: Diagnosis not present

## 2022-08-03 DIAGNOSIS — E559 Vitamin D deficiency, unspecified: Secondary | ICD-10-CM | POA: Diagnosis not present

## 2022-08-03 DIAGNOSIS — G4700 Insomnia, unspecified: Secondary | ICD-10-CM | POA: Diagnosis not present

## 2022-08-03 DIAGNOSIS — E349 Endocrine disorder, unspecified: Secondary | ICD-10-CM | POA: Diagnosis not present

## 2022-08-03 DIAGNOSIS — I421 Obstructive hypertrophic cardiomyopathy: Secondary | ICD-10-CM | POA: Diagnosis not present

## 2022-08-03 DIAGNOSIS — G4733 Obstructive sleep apnea (adult) (pediatric): Secondary | ICD-10-CM | POA: Diagnosis not present

## 2022-08-03 DIAGNOSIS — Z7982 Long term (current) use of aspirin: Secondary | ICD-10-CM | POA: Diagnosis not present

## 2022-08-03 DIAGNOSIS — R296 Repeated falls: Secondary | ICD-10-CM | POA: Diagnosis not present

## 2022-08-03 DIAGNOSIS — Z791 Long term (current) use of non-steroidal anti-inflammatories (NSAID): Secondary | ICD-10-CM | POA: Diagnosis not present

## 2022-08-03 DIAGNOSIS — G901 Familial dysautonomia [Riley-Day]: Secondary | ICD-10-CM | POA: Diagnosis not present

## 2022-08-03 DIAGNOSIS — M1990 Unspecified osteoarthritis, unspecified site: Secondary | ICD-10-CM | POA: Diagnosis not present

## 2022-08-03 DIAGNOSIS — E782 Mixed hyperlipidemia: Secondary | ICD-10-CM | POA: Diagnosis not present

## 2022-08-03 DIAGNOSIS — H409 Unspecified glaucoma: Secondary | ICD-10-CM | POA: Diagnosis not present

## 2022-08-03 DIAGNOSIS — E039 Hypothyroidism, unspecified: Secondary | ICD-10-CM | POA: Diagnosis not present

## 2022-08-03 DIAGNOSIS — G231 Progressive supranuclear ophthalmoplegia [Steele-Richardson-Olszewski]: Secondary | ICD-10-CM | POA: Diagnosis not present

## 2022-08-03 DIAGNOSIS — G20C Parkinsonism, unspecified: Secondary | ICD-10-CM | POA: Diagnosis not present

## 2022-08-03 DIAGNOSIS — J302 Other seasonal allergic rhinitis: Secondary | ICD-10-CM | POA: Diagnosis not present

## 2022-08-03 DIAGNOSIS — Z79899 Other long term (current) drug therapy: Secondary | ICD-10-CM | POA: Diagnosis not present

## 2022-08-03 DIAGNOSIS — M5136 Other intervertebral disc degeneration, lumbar region: Secondary | ICD-10-CM | POA: Diagnosis not present

## 2022-08-06 ENCOUNTER — Telehealth: Payer: Self-pay | Admitting: Family Medicine

## 2022-08-06 NOTE — Telephone Encounter (Signed)
..  Home Health Verbal Orders  Agency:  [facility]  Caller: Earlie Server and title: Physical Therapist. [phone]  Requesting OT/ PT/ Skilled nursing/ Social Work/ Speech:  PT   Reason for Request:    Frequency:  1wk (1x), 2 wk (3x), and 1wk (5x)  HH needs F2F w/in last 30 days

## 2022-08-06 NOTE — Telephone Encounter (Signed)
LVM oking the verbal orders per Jonni Sanger.

## 2022-08-07 DIAGNOSIS — G20C Parkinsonism, unspecified: Secondary | ICD-10-CM | POA: Diagnosis not present

## 2022-08-07 DIAGNOSIS — M1990 Unspecified osteoarthritis, unspecified site: Secondary | ICD-10-CM | POA: Diagnosis not present

## 2022-08-07 DIAGNOSIS — E782 Mixed hyperlipidemia: Secondary | ICD-10-CM | POA: Diagnosis not present

## 2022-08-07 DIAGNOSIS — N401 Enlarged prostate with lower urinary tract symptoms: Secondary | ICD-10-CM | POA: Diagnosis not present

## 2022-08-07 DIAGNOSIS — G4733 Obstructive sleep apnea (adult) (pediatric): Secondary | ICD-10-CM | POA: Diagnosis not present

## 2022-08-07 DIAGNOSIS — M5136 Other intervertebral disc degeneration, lumbar region: Secondary | ICD-10-CM | POA: Diagnosis not present

## 2022-08-09 DIAGNOSIS — M5136 Other intervertebral disc degeneration, lumbar region: Secondary | ICD-10-CM | POA: Diagnosis not present

## 2022-08-09 DIAGNOSIS — M1990 Unspecified osteoarthritis, unspecified site: Secondary | ICD-10-CM | POA: Diagnosis not present

## 2022-08-09 DIAGNOSIS — E782 Mixed hyperlipidemia: Secondary | ICD-10-CM | POA: Diagnosis not present

## 2022-08-09 DIAGNOSIS — G4733 Obstructive sleep apnea (adult) (pediatric): Secondary | ICD-10-CM | POA: Diagnosis not present

## 2022-08-09 DIAGNOSIS — G20C Parkinsonism, unspecified: Secondary | ICD-10-CM | POA: Diagnosis not present

## 2022-08-09 DIAGNOSIS — N401 Enlarged prostate with lower urinary tract symptoms: Secondary | ICD-10-CM | POA: Diagnosis not present

## 2022-08-10 ENCOUNTER — Telehealth: Payer: Self-pay | Admitting: Family Medicine

## 2022-08-10 DIAGNOSIS — Z23 Encounter for immunization: Secondary | ICD-10-CM | POA: Diagnosis not present

## 2022-08-10 NOTE — Telephone Encounter (Signed)
..  Type of form received: poc   Additional comments:   Received by: [facility]  Form should be Faxed GU:[phone]  Form should be mailed to:  na   Is patient requesting call for pickup: na    Form placed:  dr Toy Baker folder   Attach charge sheet.  Yes   Individual made aware of 3-5 business day turn around (Y/N)?   No

## 2022-08-14 DIAGNOSIS — M5136 Other intervertebral disc degeneration, lumbar region: Secondary | ICD-10-CM | POA: Diagnosis not present

## 2022-08-14 DIAGNOSIS — G20C Parkinsonism, unspecified: Secondary | ICD-10-CM | POA: Diagnosis not present

## 2022-08-14 DIAGNOSIS — E782 Mixed hyperlipidemia: Secondary | ICD-10-CM | POA: Diagnosis not present

## 2022-08-14 DIAGNOSIS — G4733 Obstructive sleep apnea (adult) (pediatric): Secondary | ICD-10-CM | POA: Diagnosis not present

## 2022-08-14 DIAGNOSIS — N401 Enlarged prostate with lower urinary tract symptoms: Secondary | ICD-10-CM | POA: Diagnosis not present

## 2022-08-14 DIAGNOSIS — M1990 Unspecified osteoarthritis, unspecified site: Secondary | ICD-10-CM | POA: Diagnosis not present

## 2022-08-16 NOTE — Telephone Encounter (Signed)
Forms has been faxed and placed to scan

## 2022-08-17 DIAGNOSIS — E782 Mixed hyperlipidemia: Secondary | ICD-10-CM | POA: Diagnosis not present

## 2022-08-17 DIAGNOSIS — G4733 Obstructive sleep apnea (adult) (pediatric): Secondary | ICD-10-CM | POA: Diagnosis not present

## 2022-08-17 DIAGNOSIS — N401 Enlarged prostate with lower urinary tract symptoms: Secondary | ICD-10-CM | POA: Diagnosis not present

## 2022-08-17 DIAGNOSIS — M5136 Other intervertebral disc degeneration, lumbar region: Secondary | ICD-10-CM | POA: Diagnosis not present

## 2022-08-17 DIAGNOSIS — G20C Parkinsonism, unspecified: Secondary | ICD-10-CM | POA: Diagnosis not present

## 2022-08-17 DIAGNOSIS — M1990 Unspecified osteoarthritis, unspecified site: Secondary | ICD-10-CM | POA: Diagnosis not present

## 2022-08-21 DIAGNOSIS — N401 Enlarged prostate with lower urinary tract symptoms: Secondary | ICD-10-CM | POA: Diagnosis not present

## 2022-08-21 DIAGNOSIS — G20C Parkinsonism, unspecified: Secondary | ICD-10-CM | POA: Diagnosis not present

## 2022-08-21 DIAGNOSIS — M5136 Other intervertebral disc degeneration, lumbar region: Secondary | ICD-10-CM | POA: Diagnosis not present

## 2022-08-21 DIAGNOSIS — G4733 Obstructive sleep apnea (adult) (pediatric): Secondary | ICD-10-CM | POA: Diagnosis not present

## 2022-08-21 DIAGNOSIS — M1990 Unspecified osteoarthritis, unspecified site: Secondary | ICD-10-CM | POA: Diagnosis not present

## 2022-08-21 DIAGNOSIS — E782 Mixed hyperlipidemia: Secondary | ICD-10-CM | POA: Diagnosis not present

## 2022-08-23 DIAGNOSIS — M5136 Other intervertebral disc degeneration, lumbar region: Secondary | ICD-10-CM | POA: Diagnosis not present

## 2022-08-23 DIAGNOSIS — M1990 Unspecified osteoarthritis, unspecified site: Secondary | ICD-10-CM | POA: Diagnosis not present

## 2022-08-23 DIAGNOSIS — E782 Mixed hyperlipidemia: Secondary | ICD-10-CM | POA: Diagnosis not present

## 2022-08-23 DIAGNOSIS — G20C Parkinsonism, unspecified: Secondary | ICD-10-CM | POA: Diagnosis not present

## 2022-08-23 DIAGNOSIS — G4733 Obstructive sleep apnea (adult) (pediatric): Secondary | ICD-10-CM | POA: Diagnosis not present

## 2022-08-23 DIAGNOSIS — N401 Enlarged prostate with lower urinary tract symptoms: Secondary | ICD-10-CM | POA: Diagnosis not present

## 2022-08-29 ENCOUNTER — Telehealth: Payer: Self-pay | Admitting: Family Medicine

## 2022-08-29 DIAGNOSIS — M5136 Other intervertebral disc degeneration, lumbar region: Secondary | ICD-10-CM | POA: Diagnosis not present

## 2022-08-29 DIAGNOSIS — M1990 Unspecified osteoarthritis, unspecified site: Secondary | ICD-10-CM | POA: Diagnosis not present

## 2022-08-29 DIAGNOSIS — E782 Mixed hyperlipidemia: Secondary | ICD-10-CM | POA: Diagnosis not present

## 2022-08-29 DIAGNOSIS — G4733 Obstructive sleep apnea (adult) (pediatric): Secondary | ICD-10-CM | POA: Diagnosis not present

## 2022-08-29 DIAGNOSIS — N401 Enlarged prostate with lower urinary tract symptoms: Secondary | ICD-10-CM | POA: Diagnosis not present

## 2022-08-29 DIAGNOSIS — G20C Parkinsonism, unspecified: Secondary | ICD-10-CM | POA: Diagnosis not present

## 2022-08-29 NOTE — Telephone Encounter (Signed)
Verbal orders has been granted

## 2022-08-29 NOTE — Telephone Encounter (Signed)
..  Home Health Verbal Orders  Agency:  [place]: Simmie Davies, [place]  [phone]  Requesting OT/ PT/ Skilled nursing/ Social Work/ Speech:   - Increase PT  - Social worker evaluation  Reason for Request:  -Work on gait training, balance, and fall risk reduction.  - Would like to discuss what patient qualifies for with social worker  Frequency:  PT-- Twice a week x 3 weeks

## 2022-09-02 DIAGNOSIS — J302 Other seasonal allergic rhinitis: Secondary | ICD-10-CM | POA: Diagnosis not present

## 2022-09-02 DIAGNOSIS — K219 Gastro-esophageal reflux disease without esophagitis: Secondary | ICD-10-CM | POA: Diagnosis not present

## 2022-09-02 DIAGNOSIS — N401 Enlarged prostate with lower urinary tract symptoms: Secondary | ICD-10-CM | POA: Diagnosis not present

## 2022-09-02 DIAGNOSIS — E039 Hypothyroidism, unspecified: Secondary | ICD-10-CM | POA: Diagnosis not present

## 2022-09-02 DIAGNOSIS — G4700 Insomnia, unspecified: Secondary | ICD-10-CM | POA: Diagnosis not present

## 2022-09-02 DIAGNOSIS — G20C Parkinsonism, unspecified: Secondary | ICD-10-CM | POA: Diagnosis not present

## 2022-09-02 DIAGNOSIS — R296 Repeated falls: Secondary | ICD-10-CM | POA: Diagnosis not present

## 2022-09-02 DIAGNOSIS — N39498 Other specified urinary incontinence: Secondary | ICD-10-CM | POA: Diagnosis not present

## 2022-09-02 DIAGNOSIS — M1990 Unspecified osteoarthritis, unspecified site: Secondary | ICD-10-CM | POA: Diagnosis not present

## 2022-09-02 DIAGNOSIS — M48061 Spinal stenosis, lumbar region without neurogenic claudication: Secondary | ICD-10-CM | POA: Diagnosis not present

## 2022-09-02 DIAGNOSIS — H409 Unspecified glaucoma: Secondary | ICD-10-CM | POA: Diagnosis not present

## 2022-09-02 DIAGNOSIS — E782 Mixed hyperlipidemia: Secondary | ICD-10-CM | POA: Diagnosis not present

## 2022-09-02 DIAGNOSIS — G231 Progressive supranuclear ophthalmoplegia [Steele-Richardson-Olszewski]: Secondary | ICD-10-CM | POA: Diagnosis not present

## 2022-09-02 DIAGNOSIS — G901 Familial dysautonomia [Riley-Day]: Secondary | ICD-10-CM | POA: Diagnosis not present

## 2022-09-02 DIAGNOSIS — I421 Obstructive hypertrophic cardiomyopathy: Secondary | ICD-10-CM | POA: Diagnosis not present

## 2022-09-02 DIAGNOSIS — Z79899 Other long term (current) drug therapy: Secondary | ICD-10-CM | POA: Diagnosis not present

## 2022-09-02 DIAGNOSIS — R1310 Dysphagia, unspecified: Secondary | ICD-10-CM | POA: Diagnosis not present

## 2022-09-02 DIAGNOSIS — Z791 Long term (current) use of non-steroidal anti-inflammatories (NSAID): Secondary | ICD-10-CM | POA: Diagnosis not present

## 2022-09-02 DIAGNOSIS — E349 Endocrine disorder, unspecified: Secondary | ICD-10-CM | POA: Diagnosis not present

## 2022-09-02 DIAGNOSIS — G4733 Obstructive sleep apnea (adult) (pediatric): Secondary | ICD-10-CM | POA: Diagnosis not present

## 2022-09-02 DIAGNOSIS — G629 Polyneuropathy, unspecified: Secondary | ICD-10-CM | POA: Diagnosis not present

## 2022-09-02 DIAGNOSIS — M5136 Other intervertebral disc degeneration, lumbar region: Secondary | ICD-10-CM | POA: Diagnosis not present

## 2022-09-02 DIAGNOSIS — E559 Vitamin D deficiency, unspecified: Secondary | ICD-10-CM | POA: Diagnosis not present

## 2022-09-02 DIAGNOSIS — Z85828 Personal history of other malignant neoplasm of skin: Secondary | ICD-10-CM | POA: Diagnosis not present

## 2022-09-02 DIAGNOSIS — Z7982 Long term (current) use of aspirin: Secondary | ICD-10-CM | POA: Diagnosis not present

## 2022-09-04 DIAGNOSIS — N401 Enlarged prostate with lower urinary tract symptoms: Secondary | ICD-10-CM | POA: Diagnosis not present

## 2022-09-04 DIAGNOSIS — E782 Mixed hyperlipidemia: Secondary | ICD-10-CM | POA: Diagnosis not present

## 2022-09-04 DIAGNOSIS — G4733 Obstructive sleep apnea (adult) (pediatric): Secondary | ICD-10-CM | POA: Diagnosis not present

## 2022-09-04 DIAGNOSIS — M1990 Unspecified osteoarthritis, unspecified site: Secondary | ICD-10-CM | POA: Diagnosis not present

## 2022-09-04 DIAGNOSIS — M5136 Other intervertebral disc degeneration, lumbar region: Secondary | ICD-10-CM | POA: Diagnosis not present

## 2022-09-04 DIAGNOSIS — G20C Parkinsonism, unspecified: Secondary | ICD-10-CM | POA: Diagnosis not present

## 2022-09-07 ENCOUNTER — Other Ambulatory Visit: Payer: Self-pay | Admitting: Family Medicine

## 2022-09-14 DIAGNOSIS — M1990 Unspecified osteoarthritis, unspecified site: Secondary | ICD-10-CM | POA: Diagnosis not present

## 2022-09-14 DIAGNOSIS — G4733 Obstructive sleep apnea (adult) (pediatric): Secondary | ICD-10-CM | POA: Diagnosis not present

## 2022-09-14 DIAGNOSIS — E782 Mixed hyperlipidemia: Secondary | ICD-10-CM | POA: Diagnosis not present

## 2022-09-14 DIAGNOSIS — N401 Enlarged prostate with lower urinary tract symptoms: Secondary | ICD-10-CM | POA: Diagnosis not present

## 2022-09-14 DIAGNOSIS — G20C Parkinsonism, unspecified: Secondary | ICD-10-CM | POA: Diagnosis not present

## 2022-09-14 DIAGNOSIS — M5136 Other intervertebral disc degeneration, lumbar region: Secondary | ICD-10-CM | POA: Diagnosis not present

## 2022-09-17 DIAGNOSIS — H401132 Primary open-angle glaucoma, bilateral, moderate stage: Secondary | ICD-10-CM | POA: Diagnosis not present

## 2022-10-16 ENCOUNTER — Telehealth: Payer: Self-pay | Admitting: Adult Health

## 2022-10-16 ENCOUNTER — Telehealth: Payer: Self-pay | Admitting: Neurology

## 2022-10-16 DIAGNOSIS — G231 Progressive supranuclear ophthalmoplegia [Steele-Richardson-Olszewski]: Secondary | ICD-10-CM

## 2022-10-16 DIAGNOSIS — Z9181 History of falling: Secondary | ICD-10-CM

## 2022-10-16 DIAGNOSIS — Z7409 Other reduced mobility: Secondary | ICD-10-CM

## 2022-10-16 DIAGNOSIS — R1310 Dysphagia, unspecified: Secondary | ICD-10-CM

## 2022-10-16 NOTE — Telephone Encounter (Signed)
[facility] is taking this patient. 

## 2022-10-16 NOTE — Telephone Encounter (Signed)
Noted thanks. I have placed the order for [facility] PT as ordered previously. Called pt's wife back and confirmed order placed and will be sent to [place] by our office. She was very Patent attorney. Next appt w/ our office is on 01/22/23.

## 2022-10-16 NOTE — Telephone Encounter (Signed)
Ok to order [facility].

## 2022-10-16 NOTE — Telephone Encounter (Signed)
Pt wife is calling, Stating she would like for pt to have another around of PT in the home with [place].

## 2022-10-16 NOTE — Addendum Note (Signed)
Addended by: Gildardo Griffes on: 10/16/2022 12:00 PM   Modules accepted: Orders

## 2022-10-17 DIAGNOSIS — Z7982 Long term (current) use of aspirin: Secondary | ICD-10-CM | POA: Diagnosis not present

## 2022-10-17 DIAGNOSIS — E785 Hyperlipidemia, unspecified: Secondary | ICD-10-CM | POA: Diagnosis not present

## 2022-10-17 DIAGNOSIS — E559 Vitamin D deficiency, unspecified: Secondary | ICD-10-CM | POA: Diagnosis not present

## 2022-10-17 DIAGNOSIS — G231 Progressive supranuclear ophthalmoplegia [Steele-Richardson-Olszewski]: Secondary | ICD-10-CM | POA: Diagnosis not present

## 2022-10-17 DIAGNOSIS — M48061 Spinal stenosis, lumbar region without neurogenic claudication: Secondary | ICD-10-CM | POA: Diagnosis not present

## 2022-10-17 DIAGNOSIS — R1310 Dysphagia, unspecified: Secondary | ICD-10-CM | POA: Diagnosis not present

## 2022-10-17 DIAGNOSIS — M1990 Unspecified osteoarthritis, unspecified site: Secondary | ICD-10-CM | POA: Diagnosis not present

## 2022-10-17 DIAGNOSIS — J309 Allergic rhinitis, unspecified: Secondary | ICD-10-CM | POA: Diagnosis not present

## 2022-10-17 DIAGNOSIS — K5900 Constipation, unspecified: Secondary | ICD-10-CM | POA: Diagnosis not present

## 2022-10-17 DIAGNOSIS — Z9181 History of falling: Secondary | ICD-10-CM | POA: Diagnosis not present

## 2022-10-17 DIAGNOSIS — E291 Testicular hypofunction: Secondary | ICD-10-CM | POA: Diagnosis not present

## 2022-10-17 DIAGNOSIS — K219 Gastro-esophageal reflux disease without esophagitis: Secondary | ICD-10-CM | POA: Diagnosis not present

## 2022-10-17 DIAGNOSIS — Z79899 Other long term (current) drug therapy: Secondary | ICD-10-CM | POA: Diagnosis not present

## 2022-10-17 DIAGNOSIS — M50921 Unspecified cervical disc disorder at C4-C5 level: Secondary | ICD-10-CM | POA: Diagnosis not present

## 2022-10-17 DIAGNOSIS — E039 Hypothyroidism, unspecified: Secondary | ICD-10-CM | POA: Diagnosis not present

## 2022-10-17 DIAGNOSIS — G4733 Obstructive sleep apnea (adult) (pediatric): Secondary | ICD-10-CM | POA: Diagnosis not present

## 2022-10-17 DIAGNOSIS — G4700 Insomnia, unspecified: Secondary | ICD-10-CM | POA: Diagnosis not present

## 2022-10-17 DIAGNOSIS — H409 Unspecified glaucoma: Secondary | ICD-10-CM | POA: Diagnosis not present

## 2022-10-23 DIAGNOSIS — M1990 Unspecified osteoarthritis, unspecified site: Secondary | ICD-10-CM | POA: Diagnosis not present

## 2022-10-23 DIAGNOSIS — M48061 Spinal stenosis, lumbar region without neurogenic claudication: Secondary | ICD-10-CM | POA: Diagnosis not present

## 2022-10-23 DIAGNOSIS — E785 Hyperlipidemia, unspecified: Secondary | ICD-10-CM | POA: Diagnosis not present

## 2022-10-23 DIAGNOSIS — R1310 Dysphagia, unspecified: Secondary | ICD-10-CM | POA: Diagnosis not present

## 2022-10-23 DIAGNOSIS — M50921 Unspecified cervical disc disorder at C4-C5 level: Secondary | ICD-10-CM | POA: Diagnosis not present

## 2022-10-23 DIAGNOSIS — G231 Progressive supranuclear ophthalmoplegia [Steele-Richardson-Olszewski]: Secondary | ICD-10-CM | POA: Diagnosis not present

## 2022-10-26 DIAGNOSIS — M1990 Unspecified osteoarthritis, unspecified site: Secondary | ICD-10-CM | POA: Diagnosis not present

## 2022-10-26 DIAGNOSIS — R1310 Dysphagia, unspecified: Secondary | ICD-10-CM | POA: Diagnosis not present

## 2022-10-26 DIAGNOSIS — M48061 Spinal stenosis, lumbar region without neurogenic claudication: Secondary | ICD-10-CM | POA: Diagnosis not present

## 2022-10-26 DIAGNOSIS — G231 Progressive supranuclear ophthalmoplegia [Steele-Richardson-Olszewski]: Secondary | ICD-10-CM | POA: Diagnosis not present

## 2022-10-26 DIAGNOSIS — E785 Hyperlipidemia, unspecified: Secondary | ICD-10-CM | POA: Diagnosis not present

## 2022-10-26 DIAGNOSIS — M50921 Unspecified cervical disc disorder at C4-C5 level: Secondary | ICD-10-CM | POA: Diagnosis not present

## 2022-10-30 DIAGNOSIS — H401132 Primary open-angle glaucoma, bilateral, moderate stage: Secondary | ICD-10-CM | POA: Diagnosis not present

## 2022-11-01 DIAGNOSIS — M48061 Spinal stenosis, lumbar region without neurogenic claudication: Secondary | ICD-10-CM | POA: Diagnosis not present

## 2022-11-01 DIAGNOSIS — G231 Progressive supranuclear ophthalmoplegia [Steele-Richardson-Olszewski]: Secondary | ICD-10-CM | POA: Diagnosis not present

## 2022-11-01 DIAGNOSIS — R1310 Dysphagia, unspecified: Secondary | ICD-10-CM | POA: Diagnosis not present

## 2022-11-01 DIAGNOSIS — E785 Hyperlipidemia, unspecified: Secondary | ICD-10-CM | POA: Diagnosis not present

## 2022-11-01 DIAGNOSIS — M1990 Unspecified osteoarthritis, unspecified site: Secondary | ICD-10-CM | POA: Diagnosis not present

## 2022-11-01 DIAGNOSIS — M50921 Unspecified cervical disc disorder at C4-C5 level: Secondary | ICD-10-CM | POA: Diagnosis not present

## 2022-11-02 DIAGNOSIS — M1990 Unspecified osteoarthritis, unspecified site: Secondary | ICD-10-CM | POA: Diagnosis not present

## 2022-11-02 DIAGNOSIS — G231 Progressive supranuclear ophthalmoplegia [Steele-Richardson-Olszewski]: Secondary | ICD-10-CM | POA: Diagnosis not present

## 2022-11-02 DIAGNOSIS — M50921 Unspecified cervical disc disorder at C4-C5 level: Secondary | ICD-10-CM | POA: Diagnosis not present

## 2022-11-02 DIAGNOSIS — E785 Hyperlipidemia, unspecified: Secondary | ICD-10-CM | POA: Diagnosis not present

## 2022-11-02 DIAGNOSIS — M48061 Spinal stenosis, lumbar region without neurogenic claudication: Secondary | ICD-10-CM | POA: Diagnosis not present

## 2022-11-02 DIAGNOSIS — R1310 Dysphagia, unspecified: Secondary | ICD-10-CM | POA: Diagnosis not present

## 2022-11-06 DIAGNOSIS — E785 Hyperlipidemia, unspecified: Secondary | ICD-10-CM | POA: Diagnosis not present

## 2022-11-06 DIAGNOSIS — M50921 Unspecified cervical disc disorder at C4-C5 level: Secondary | ICD-10-CM | POA: Diagnosis not present

## 2022-11-06 DIAGNOSIS — R1310 Dysphagia, unspecified: Secondary | ICD-10-CM | POA: Diagnosis not present

## 2022-11-06 DIAGNOSIS — M1990 Unspecified osteoarthritis, unspecified site: Secondary | ICD-10-CM | POA: Diagnosis not present

## 2022-11-06 DIAGNOSIS — M48061 Spinal stenosis, lumbar region without neurogenic claudication: Secondary | ICD-10-CM | POA: Diagnosis not present

## 2022-11-06 DIAGNOSIS — G231 Progressive supranuclear ophthalmoplegia [Steele-Richardson-Olszewski]: Secondary | ICD-10-CM | POA: Diagnosis not present

## 2022-11-08 DIAGNOSIS — M1990 Unspecified osteoarthritis, unspecified site: Secondary | ICD-10-CM | POA: Diagnosis not present

## 2022-11-08 DIAGNOSIS — G231 Progressive supranuclear ophthalmoplegia [Steele-Richardson-Olszewski]: Secondary | ICD-10-CM | POA: Diagnosis not present

## 2022-11-08 DIAGNOSIS — R1310 Dysphagia, unspecified: Secondary | ICD-10-CM | POA: Diagnosis not present

## 2022-11-08 DIAGNOSIS — M50921 Unspecified cervical disc disorder at C4-C5 level: Secondary | ICD-10-CM | POA: Diagnosis not present

## 2022-11-08 DIAGNOSIS — M48061 Spinal stenosis, lumbar region without neurogenic claudication: Secondary | ICD-10-CM | POA: Diagnosis not present

## 2022-11-08 DIAGNOSIS — E785 Hyperlipidemia, unspecified: Secondary | ICD-10-CM | POA: Diagnosis not present

## 2022-11-13 DIAGNOSIS — E785 Hyperlipidemia, unspecified: Secondary | ICD-10-CM | POA: Diagnosis not present

## 2022-11-13 DIAGNOSIS — M1990 Unspecified osteoarthritis, unspecified site: Secondary | ICD-10-CM | POA: Diagnosis not present

## 2022-11-13 DIAGNOSIS — G231 Progressive supranuclear ophthalmoplegia [Steele-Richardson-Olszewski]: Secondary | ICD-10-CM | POA: Diagnosis not present

## 2022-11-13 DIAGNOSIS — R1310 Dysphagia, unspecified: Secondary | ICD-10-CM | POA: Diagnosis not present

## 2022-11-13 DIAGNOSIS — M48061 Spinal stenosis, lumbar region without neurogenic claudication: Secondary | ICD-10-CM | POA: Diagnosis not present

## 2022-11-13 DIAGNOSIS — M50921 Unspecified cervical disc disorder at C4-C5 level: Secondary | ICD-10-CM | POA: Diagnosis not present

## 2022-11-16 DIAGNOSIS — R1310 Dysphagia, unspecified: Secondary | ICD-10-CM | POA: Diagnosis not present

## 2022-11-16 DIAGNOSIS — J309 Allergic rhinitis, unspecified: Secondary | ICD-10-CM | POA: Diagnosis not present

## 2022-11-16 DIAGNOSIS — E039 Hypothyroidism, unspecified: Secondary | ICD-10-CM | POA: Diagnosis not present

## 2022-11-16 DIAGNOSIS — H409 Unspecified glaucoma: Secondary | ICD-10-CM | POA: Diagnosis not present

## 2022-11-16 DIAGNOSIS — Z79899 Other long term (current) drug therapy: Secondary | ICD-10-CM | POA: Diagnosis not present

## 2022-11-16 DIAGNOSIS — Z7982 Long term (current) use of aspirin: Secondary | ICD-10-CM | POA: Diagnosis not present

## 2022-11-16 DIAGNOSIS — M50921 Unspecified cervical disc disorder at C4-C5 level: Secondary | ICD-10-CM | POA: Diagnosis not present

## 2022-11-16 DIAGNOSIS — G231 Progressive supranuclear ophthalmoplegia [Steele-Richardson-Olszewski]: Secondary | ICD-10-CM | POA: Diagnosis not present

## 2022-11-16 DIAGNOSIS — Z9181 History of falling: Secondary | ICD-10-CM | POA: Diagnosis not present

## 2022-11-16 DIAGNOSIS — K5900 Constipation, unspecified: Secondary | ICD-10-CM | POA: Diagnosis not present

## 2022-11-16 DIAGNOSIS — E785 Hyperlipidemia, unspecified: Secondary | ICD-10-CM | POA: Diagnosis not present

## 2022-11-16 DIAGNOSIS — M1990 Unspecified osteoarthritis, unspecified site: Secondary | ICD-10-CM | POA: Diagnosis not present

## 2022-11-16 DIAGNOSIS — M48061 Spinal stenosis, lumbar region without neurogenic claudication: Secondary | ICD-10-CM | POA: Diagnosis not present

## 2022-11-16 DIAGNOSIS — K219 Gastro-esophageal reflux disease without esophagitis: Secondary | ICD-10-CM | POA: Diagnosis not present

## 2022-11-16 DIAGNOSIS — E559 Vitamin D deficiency, unspecified: Secondary | ICD-10-CM | POA: Diagnosis not present

## 2022-11-16 DIAGNOSIS — E291 Testicular hypofunction: Secondary | ICD-10-CM | POA: Diagnosis not present

## 2022-11-16 DIAGNOSIS — G4733 Obstructive sleep apnea (adult) (pediatric): Secondary | ICD-10-CM | POA: Diagnosis not present

## 2022-11-16 DIAGNOSIS — G4700 Insomnia, unspecified: Secondary | ICD-10-CM | POA: Diagnosis not present

## 2022-11-20 DIAGNOSIS — D485 Neoplasm of uncertain behavior of skin: Secondary | ICD-10-CM | POA: Diagnosis not present

## 2022-11-20 DIAGNOSIS — I8311 Varicose veins of right lower extremity with inflammation: Secondary | ICD-10-CM | POA: Diagnosis not present

## 2022-11-20 DIAGNOSIS — Z85828 Personal history of other malignant neoplasm of skin: Secondary | ICD-10-CM | POA: Diagnosis not present

## 2022-11-20 DIAGNOSIS — I872 Venous insufficiency (chronic) (peripheral): Secondary | ICD-10-CM | POA: Diagnosis not present

## 2022-11-20 DIAGNOSIS — I8312 Varicose veins of left lower extremity with inflammation: Secondary | ICD-10-CM | POA: Diagnosis not present

## 2022-11-20 DIAGNOSIS — D692 Other nonthrombocytopenic purpura: Secondary | ICD-10-CM | POA: Diagnosis not present

## 2022-11-21 DIAGNOSIS — E785 Hyperlipidemia, unspecified: Secondary | ICD-10-CM | POA: Diagnosis not present

## 2022-11-21 DIAGNOSIS — M1990 Unspecified osteoarthritis, unspecified site: Secondary | ICD-10-CM | POA: Diagnosis not present

## 2022-11-21 DIAGNOSIS — R1310 Dysphagia, unspecified: Secondary | ICD-10-CM | POA: Diagnosis not present

## 2022-11-21 DIAGNOSIS — M48061 Spinal stenosis, lumbar region without neurogenic claudication: Secondary | ICD-10-CM | POA: Diagnosis not present

## 2022-11-21 DIAGNOSIS — M50921 Unspecified cervical disc disorder at C4-C5 level: Secondary | ICD-10-CM | POA: Diagnosis not present

## 2022-11-21 DIAGNOSIS — G231 Progressive supranuclear ophthalmoplegia [Steele-Richardson-Olszewski]: Secondary | ICD-10-CM | POA: Diagnosis not present

## 2022-11-27 DIAGNOSIS — G231 Progressive supranuclear ophthalmoplegia [Steele-Richardson-Olszewski]: Secondary | ICD-10-CM | POA: Diagnosis not present

## 2022-11-27 DIAGNOSIS — E785 Hyperlipidemia, unspecified: Secondary | ICD-10-CM | POA: Diagnosis not present

## 2022-11-27 DIAGNOSIS — M48061 Spinal stenosis, lumbar region without neurogenic claudication: Secondary | ICD-10-CM | POA: Diagnosis not present

## 2022-11-27 DIAGNOSIS — M50921 Unspecified cervical disc disorder at C4-C5 level: Secondary | ICD-10-CM | POA: Diagnosis not present

## 2022-11-27 DIAGNOSIS — M1990 Unspecified osteoarthritis, unspecified site: Secondary | ICD-10-CM | POA: Diagnosis not present

## 2022-11-27 DIAGNOSIS — R1310 Dysphagia, unspecified: Secondary | ICD-10-CM | POA: Diagnosis not present

## 2022-12-04 DIAGNOSIS — R1310 Dysphagia, unspecified: Secondary | ICD-10-CM | POA: Diagnosis not present

## 2022-12-04 DIAGNOSIS — G231 Progressive supranuclear ophthalmoplegia [Steele-Richardson-Olszewski]: Secondary | ICD-10-CM | POA: Diagnosis not present

## 2022-12-04 DIAGNOSIS — M50921 Unspecified cervical disc disorder at C4-C5 level: Secondary | ICD-10-CM | POA: Diagnosis not present

## 2022-12-04 DIAGNOSIS — M1990 Unspecified osteoarthritis, unspecified site: Secondary | ICD-10-CM | POA: Diagnosis not present

## 2022-12-04 DIAGNOSIS — E785 Hyperlipidemia, unspecified: Secondary | ICD-10-CM | POA: Diagnosis not present

## 2022-12-04 DIAGNOSIS — M48061 Spinal stenosis, lumbar region without neurogenic claudication: Secondary | ICD-10-CM | POA: Diagnosis not present

## 2022-12-05 ENCOUNTER — Other Ambulatory Visit: Payer: Self-pay | Admitting: Family Medicine

## 2022-12-05 DIAGNOSIS — J302 Other seasonal allergic rhinitis: Secondary | ICD-10-CM

## 2022-12-13 DIAGNOSIS — E785 Hyperlipidemia, unspecified: Secondary | ICD-10-CM | POA: Diagnosis not present

## 2022-12-13 DIAGNOSIS — M1990 Unspecified osteoarthritis, unspecified site: Secondary | ICD-10-CM | POA: Diagnosis not present

## 2022-12-13 DIAGNOSIS — G231 Progressive supranuclear ophthalmoplegia [Steele-Richardson-Olszewski]: Secondary | ICD-10-CM | POA: Diagnosis not present

## 2022-12-13 DIAGNOSIS — M50921 Unspecified cervical disc disorder at C4-C5 level: Secondary | ICD-10-CM | POA: Diagnosis not present

## 2022-12-13 DIAGNOSIS — M48061 Spinal stenosis, lumbar region without neurogenic claudication: Secondary | ICD-10-CM | POA: Diagnosis not present

## 2022-12-13 DIAGNOSIS — R1310 Dysphagia, unspecified: Secondary | ICD-10-CM | POA: Diagnosis not present

## 2022-12-16 DIAGNOSIS — G20C Parkinsonism, unspecified: Secondary | ICD-10-CM | POA: Diagnosis not present

## 2022-12-16 DIAGNOSIS — E291 Testicular hypofunction: Secondary | ICD-10-CM | POA: Diagnosis not present

## 2022-12-16 DIAGNOSIS — M50921 Unspecified cervical disc disorder at C4-C5 level: Secondary | ICD-10-CM | POA: Diagnosis not present

## 2022-12-16 DIAGNOSIS — Z85828 Personal history of other malignant neoplasm of skin: Secondary | ICD-10-CM | POA: Diagnosis not present

## 2022-12-16 DIAGNOSIS — K5900 Constipation, unspecified: Secondary | ICD-10-CM | POA: Diagnosis not present

## 2022-12-16 DIAGNOSIS — Z7982 Long term (current) use of aspirin: Secondary | ICD-10-CM | POA: Diagnosis not present

## 2022-12-16 DIAGNOSIS — M48061 Spinal stenosis, lumbar region without neurogenic claudication: Secondary | ICD-10-CM | POA: Diagnosis not present

## 2022-12-16 DIAGNOSIS — K219 Gastro-esophageal reflux disease without esophagitis: Secondary | ICD-10-CM | POA: Diagnosis not present

## 2022-12-16 DIAGNOSIS — H409 Unspecified glaucoma: Secondary | ICD-10-CM | POA: Diagnosis not present

## 2022-12-16 DIAGNOSIS — R1310 Dysphagia, unspecified: Secondary | ICD-10-CM | POA: Diagnosis not present

## 2022-12-16 DIAGNOSIS — G231 Progressive supranuclear ophthalmoplegia [Steele-Richardson-Olszewski]: Secondary | ICD-10-CM | POA: Diagnosis not present

## 2022-12-16 DIAGNOSIS — E559 Vitamin D deficiency, unspecified: Secondary | ICD-10-CM | POA: Diagnosis not present

## 2022-12-16 DIAGNOSIS — E785 Hyperlipidemia, unspecified: Secondary | ICD-10-CM | POA: Diagnosis not present

## 2022-12-16 DIAGNOSIS — Z9181 History of falling: Secondary | ICD-10-CM | POA: Diagnosis not present

## 2022-12-16 DIAGNOSIS — J309 Allergic rhinitis, unspecified: Secondary | ICD-10-CM | POA: Diagnosis not present

## 2022-12-16 DIAGNOSIS — M1990 Unspecified osteoarthritis, unspecified site: Secondary | ICD-10-CM | POA: Diagnosis not present

## 2022-12-16 DIAGNOSIS — G4700 Insomnia, unspecified: Secondary | ICD-10-CM | POA: Diagnosis not present

## 2022-12-16 DIAGNOSIS — Z79899 Other long term (current) drug therapy: Secondary | ICD-10-CM | POA: Diagnosis not present

## 2022-12-16 DIAGNOSIS — E039 Hypothyroidism, unspecified: Secondary | ICD-10-CM | POA: Diagnosis not present

## 2022-12-16 DIAGNOSIS — G4733 Obstructive sleep apnea (adult) (pediatric): Secondary | ICD-10-CM | POA: Diagnosis not present

## 2022-12-18 DIAGNOSIS — G231 Progressive supranuclear ophthalmoplegia [Steele-Richardson-Olszewski]: Secondary | ICD-10-CM | POA: Diagnosis not present

## 2022-12-18 DIAGNOSIS — R1310 Dysphagia, unspecified: Secondary | ICD-10-CM | POA: Diagnosis not present

## 2022-12-18 DIAGNOSIS — E785 Hyperlipidemia, unspecified: Secondary | ICD-10-CM | POA: Diagnosis not present

## 2022-12-18 DIAGNOSIS — M48061 Spinal stenosis, lumbar region without neurogenic claudication: Secondary | ICD-10-CM | POA: Diagnosis not present

## 2022-12-18 DIAGNOSIS — M50921 Unspecified cervical disc disorder at C4-C5 level: Secondary | ICD-10-CM | POA: Diagnosis not present

## 2022-12-18 DIAGNOSIS — M1990 Unspecified osteoarthritis, unspecified site: Secondary | ICD-10-CM | POA: Diagnosis not present

## 2022-12-25 DIAGNOSIS — M1990 Unspecified osteoarthritis, unspecified site: Secondary | ICD-10-CM | POA: Diagnosis not present

## 2022-12-25 DIAGNOSIS — M48061 Spinal stenosis, lumbar region without neurogenic claudication: Secondary | ICD-10-CM | POA: Diagnosis not present

## 2022-12-25 DIAGNOSIS — M50921 Unspecified cervical disc disorder at C4-C5 level: Secondary | ICD-10-CM | POA: Diagnosis not present

## 2022-12-25 DIAGNOSIS — G231 Progressive supranuclear ophthalmoplegia [Steele-Richardson-Olszewski]: Secondary | ICD-10-CM | POA: Diagnosis not present

## 2022-12-25 DIAGNOSIS — E785 Hyperlipidemia, unspecified: Secondary | ICD-10-CM | POA: Diagnosis not present

## 2022-12-25 DIAGNOSIS — R1310 Dysphagia, unspecified: Secondary | ICD-10-CM | POA: Diagnosis not present

## 2023-01-02 ENCOUNTER — Encounter: Payer: Medicare Other | Admitting: Family Medicine

## 2023-01-03 DIAGNOSIS — R1310 Dysphagia, unspecified: Secondary | ICD-10-CM | POA: Diagnosis not present

## 2023-01-03 DIAGNOSIS — E785 Hyperlipidemia, unspecified: Secondary | ICD-10-CM | POA: Diagnosis not present

## 2023-01-03 DIAGNOSIS — M48061 Spinal stenosis, lumbar region without neurogenic claudication: Secondary | ICD-10-CM | POA: Diagnosis not present

## 2023-01-03 DIAGNOSIS — M1990 Unspecified osteoarthritis, unspecified site: Secondary | ICD-10-CM | POA: Diagnosis not present

## 2023-01-03 DIAGNOSIS — M50921 Unspecified cervical disc disorder at C4-C5 level: Secondary | ICD-10-CM | POA: Diagnosis not present

## 2023-01-03 DIAGNOSIS — G231 Progressive supranuclear ophthalmoplegia [Steele-Richardson-Olszewski]: Secondary | ICD-10-CM | POA: Diagnosis not present

## 2023-01-09 ENCOUNTER — Ambulatory Visit (INDEPENDENT_AMBULATORY_CARE_PROVIDER_SITE_OTHER): Payer: Medicare Other | Admitting: Family Medicine

## 2023-01-09 ENCOUNTER — Encounter: Payer: Self-pay | Admitting: Family Medicine

## 2023-01-09 VITALS — BP 130/70 | HR 51 | Temp 98.2°F | Ht 70.0 in | Wt 181.2 lb

## 2023-01-09 DIAGNOSIS — G20B2 Parkinson's disease with dyskinesia, with fluctuations: Secondary | ICD-10-CM

## 2023-01-09 DIAGNOSIS — E039 Hypothyroidism, unspecified: Secondary | ICD-10-CM

## 2023-01-09 DIAGNOSIS — G231 Progressive supranuclear ophthalmoplegia [Steele-Richardson-Olszewski]: Secondary | ICD-10-CM

## 2023-01-09 DIAGNOSIS — I421 Obstructive hypertrophic cardiomyopathy: Secondary | ICD-10-CM | POA: Diagnosis not present

## 2023-01-09 DIAGNOSIS — G901 Familial dysautonomia [Riley-Day]: Secondary | ICD-10-CM

## 2023-01-09 DIAGNOSIS — E782 Mixed hyperlipidemia: Secondary | ICD-10-CM

## 2023-01-09 LAB — COMPREHENSIVE METABOLIC PANEL
ALT: 19 U/L (ref 0–53)
AST: 19 U/L (ref 0–37)
Albumin: 4.4 g/dL (ref 3.5–5.2)
Alkaline Phosphatase: 50 U/L (ref 39–117)
BUN: 11 mg/dL (ref 6–23)
CO2: 29 mEq/L (ref 19–32)
Calcium: 9.6 mg/dL (ref 8.4–10.5)
Chloride: 104 mEq/L (ref 96–112)
Creatinine, Ser: 0.67 mg/dL (ref 0.40–1.50)
GFR: 91.11 mL/min (ref >60.00)
Glucose, Bld: 101 mg/dL — ABNORMAL HIGH (ref 70–99)
Potassium: 4.3 mEq/L (ref 3.5–5.1)
Sodium: 140 mEq/L (ref 135–145)
Total Bilirubin: 0.5 mg/dL (ref 0.2–1.2)
Total Protein: 6.6 g/dL (ref 6.0–8.3)

## 2023-01-09 LAB — CBC WITH DIFFERENTIAL/PLATELET
Basophils Absolute: 0.1 10*3/uL (ref 0.0–0.1)
Basophils Relative: 1.1 % (ref 0.0–3.0)
Eosinophils Absolute: 0.7 10*3/uL (ref 0.0–0.7)
Eosinophils Relative: 10.6 % — ABNORMAL HIGH (ref 0.0–5.0)
HCT: 43.7 % (ref 39.0–52.0)
Hemoglobin: 14.8 g/dL (ref 13.0–17.0)
Lymphocytes Relative: 26.9 % (ref 12.0–46.0)
Lymphs Abs: 1.8 10*3/uL (ref 0.7–4.0)
MCHC: 33.9 g/dL (ref 30.0–36.0)
MCV: 91.4 fl (ref 78.0–100.0)
Monocytes Absolute: 0.6 10*3/uL (ref 0.1–1.0)
Monocytes Relative: 9.6 % (ref 3.0–12.0)
Neutro Abs: 3.4 10*3/uL (ref 1.4–7.7)
Neutrophils Relative %: 51.8 % (ref 43.0–77.0)
Platelets: 224 10*3/uL (ref 150.0–400.0)
RBC: 4.79 Mil/uL (ref 4.22–5.81)
RDW: 13.4 % (ref 11.5–15.5)
WBC: 6.6 10*3/uL (ref 4.0–10.5)

## 2023-01-09 LAB — LIPID PANEL
Cholesterol: 121 mg/dL (ref 0–200)
HDL: 41.3 mg/dL (ref >39.00)
LDL Cholesterol: 47 mg/dL (ref 0–99)
NonHDL: 79.29
Total CHOL/HDL Ratio: 3
Triglycerides: 163 mg/dL — ABNORMAL HIGH (ref 0.0–149.0)
VLDL: 32.6 mg/dL (ref 0.0–40.0)

## 2023-01-09 LAB — TSH: TSH: 1.78 u[IU]/mL (ref 0.35–5.50)

## 2023-01-09 NOTE — Progress Notes (Unsigned)
Subjective  Chief Complaint  Patient presents with   Annual Exam    Pt here for Annual Exam and is not currently fasting     HPI: [name] is a 77 y.o. male who presents to [facility] today for a Male Wellness Visit. He also has the concerns and/or needs as listed above in the chief complaint. These will be addressed in addition to the Health Maintenance Visit.   Wellness Visit: annual visit with health maintenance review and exam   Health maintenance: Eligible for Shingrix vaccinations.  Education given.  .  Body mass index is 26 kg/m. Wt Readings from Last 3 Encounters:  01/09/23 181 lb 3.2 oz (82.2 kg)  07/24/22 179 lb 3.2 oz (81.3 kg)  06/29/22 179 lb 9.6 oz (81.5 kg)     Chronic disease management visit and/or acute problem visit: He has stabilized somewhat from a year ago.  Continues to follow with neurology for his Parkinson's/SNP; he has been supervised closely and walks with a walker and assisted by his caretaker or wife and fortunately has not had any recent falls.  Continues to have hypersalivation and cough.  No aspiration noted.  I reviewed neurology notes.  Managing medications. He is due for recheck of his hypothyroidism and hyperlipidemia.  I reviewed his medications.  His wife reports he gets them daily.  Energy level appears fine.  No problems swallowing his pills, she serves them in yogurt. Peripheral neuropathy: No foot lesions.  He does wear compression socks.  His wife monitors his feet closely.  Patient Active Problem List   Diagnosis Date Noted   Hypothyroidism 02/18/2018   Peripheral neuropathy 11/14/2016   PSP (progressive supranuclear palsy) 11/08/2016   Lumbar stenosis 06/06/2015   HOCM (hypertrophic obstructive cardiomyopathy) 12/12/2014   Mixed hyperlipidemia 08/30/2013   Parkinsonism 01/16/2013   History of hypertension 06/01/2008   Tubular adenoma of colon 01/23/2016   GLAUCOMA 06/01/2008   Testosterone  deficiency 11/12/2013   Impotence of organic origin 08/30/2013   Seasonal and perennial allergic rhinitis 06/01/2008   Benign prostatic hyperplasia with lower urinary tract symptoms 09/23/2020   Dysautonomia    Health Maintenance  Topic Date Due   Zoster Vaccines- Shingrix (1 of 2) Never done   Medicare Annual Wellness (AWV)  02/20/2023   INFLUENZA VACCINE  05/09/2023   DTaP/Tdap/Td (3 - Td or Tdap) 07/13/2029   Pneumonia Vaccine 72+ Years old  Completed   COVID-19 Vaccine  Completed   Hepatitis C Screening  Completed   HPV VACCINES  Aged Out   COLONOSCOPY (Pts 45-46yrs Insurance coverage will need to be confirmed)  Discontinued   Immunization History  Administered Date(s) Administered   COVID-19, mRNA, vaccine(Comirnaty)12 years and older 08/10/2022   Fluad Quad(high Dose 65+) 07/08/2019, 07/12/2020   Influenza Whole 08/29/2010, 07/09/2011, 07/21/2012   Influenza, High Dose Seasonal PF 07/24/2013, 07/15/2015, 06/23/2016, 07/04/2017, 07/07/2022   Influenza-Unspecified 07/23/2014, 07/04/2017, 07/08/2018   Moderna Sars-Covid-2 Vaccination 11/23/2019, 12/22/2019   PFIZER(Purple Top)SARS-COV-2 Vaccination 08/03/2020, 02/25/2021   Pfizer Covid-19 Vaccine Bivalent Booster 107yrs & up 07/14/2021   Pneumococcal Conjugate-13 05/13/2014   Pneumococcal Polysaccharide-23 10/08/2002, 08/12/2015   Respiratory Syncytial Virus Vaccine,Recomb Aduvanted(Arexvy) 08/17/2022   Tdap 07/08/2010, 07/14/2019   Zoster, Live 07/09/2007   We updated and reviewed the patient's past history in detail and it is documented below. Allergies: Patient is allergic to [employer] tartrate], atenolol, lunesta [eszopiclone], oxazepam, requip [ropinirole hcl], and ziac [bisoprolol-hydrochlorothiazide]. Past Medical History  has a past  medical history of Allergic rhinitis, Allergy, Arthritis, At risk for falls, Cancer, Cataract, Complication of anesthesia, Constipation, DDD (degenerative disc disease), cervical,  Dysphagia, GERD (gastroesophageal reflux disease), Glaucoma, HTN (hypertension), adenomatous colonic polyps (01/23/2016), Hyperlipidemia, Hypogonadism male, Hypothyroidism, OSA (obstructive sleep apnea), Parkinsonism (01/16/2013), Sleep apnea, Staph infection (10-2014), and Thyroid disease. Past Surgical History Patient  has a past surgical history that includes Uvulopalatopharyngoplasty; Lumbar disc surgery (04-2015); Cataract extraction; I & D extremity (Right, 10/18/2014); Tonsillectomy; Nasal septum surgery; Lumbar laminectomy with coflex 1 level (N/A, 06/06/2015); and Colonoscopy (04-27-2004). Social History Patient  reports that he has never smoked. He has never used smokeless tobacco. He reports that he does not drink alcohol and does not use drugs. Family History family history includes COPD in his mother and sister; Cancer in his father; Colon polyps in his brother and father; Diabetes in his father; Emphysema in his mother; Heart attack in his sister; Heart disease in his mother and sister; Hypertension in his father, mother, and sister; Liver cancer in his father. Review of Systems: Constitutional: negative for fever or malaise Ophthalmic: negative for photophobia, double vision or loss of vision Cardiovascular: negative for chest pain, dyspnea on exertion, or new LE swelling Respiratory: negative for SOB Gastrointestinal: negative for abdominal pain, change in bowel habits or melena Genitourinary: negative for dysuria or gross hematuria Neurological: negative for TIA or stroke symptoms Psychiatric: negative for SI or delusions Allergic/Immunologic: negative for hives  Patient Care Team    Relationship Specialty Notifications Start End  [name], MD PCP - General Family Medicine  09/25/19   Sueanne Margarita, MD PCP - Cardiology Cardiology Admissions 05/06/18   Bjorn Loser, MD Consulting Physician Urology  09/25/19   Shon Hough, MD Consulting Physician Ophthalmology   09/25/19   Star Age, MD Attending Physician Neurology  09/25/19    Objective  Vitals: BP 130/70   Pulse (!) 51   Temp 98.2 F (36.8 C)   Ht 5\' 10"  (1.778 m)   Wt 181 lb 3.2 oz (82.2 kg)   SpO2 93%   BMI 26.00 kg/m  General: No distress, answers questions when asked. HEENT:  Normocephalic, atraumatic, non-icteric sclera, no neck masses  cardiovascular:  Normal S1, S2, RRR without gallop, rub or murmur,  Respiratory:  Good breath sounds bilaterally, upper airway rhonchi present bilaterally, able to clear secretions with vigorous cough, no rales or wheezing at bases  gastrointestinal: normal bowel sounds, soft, non-tender, no noted masses. No HSM Extremities: Wearing compression stocking, no edema   Assessment  1. Parkinson's disease with dyskinesia and fluctuating manifestations   2. Mixed hyperlipidemia   3. HOCM (hypertrophic obstructive cardiomyopathy)   4. PSP (progressive supranuclear palsy)   5. Acquired hypothyroidism   6. Dysautonomia Chronic     Plan  Male Wellness Visit: Age appropriate Health Maintenance and Prevention measures were discussed with patient. Included topics are cancer screening recommendations, ways to keep healthy (see AVS) including dietary and exercise recommendations, regular eye and dental care, use of seat belts, and avoidance of moderate alcohol use and tobacco use.  BMI: discussed patient's BMI and encouraged positive lifestyle modifications to help get to or maintain a target BMI. HM needs and immunizations were addressed and ordered. See below for orders. See HM and immunization section for updates.  Educated on getting Shingrix vaccinations at pharmacy Routine labs and screening tests ordered including cmp, cbc and lipids where appropriate. Discussed recommendations regarding Vit D and calcium supplementation (see AVS)  Chronic disease f/u and/or  acute problem visit: (deemed necessary to be done in addition to the wellness  visit): Hyperlipidemia on Crestor 5: Recheck lipids and LFTs today. Hypothyroidism on levothyroxine 50 mcg daily.  Recheck levels today HOCM: Continue to follow along with cardiology.  Stable Parkinson's and progressive supranuclear palsy: Stable, fall prevention, aspiration prevention, medication management per neurology Blood pressure is holding steady.  Follow up: 12 months for complete physical Commons side effects, risks, benefits, and alternatives for medications and treatment plan prescribed today were discussed, and the patient expressed understanding of the given instructions. Patient is instructed to call or message via MyChart if he/she has any questions or concerns regarding our treatment plan. No barriers to understanding were identified. We discussed Red Flag symptoms and signs in detail. Patient expressed understanding regarding what to do in case of urgent or emergency type symptoms.  Medication list was reconciled, printed and provided to the patient in AVS. Patient instructions and summary information was reviewed with the patient as documented in the AVS. This note was prepared with assistance of Dragon voice recognition software. Occasional wrong-word or sound-a-like substitutions may have occurred due to the inherent limitations of voice recognition software    No orders of the defined types were placed in this encounter.  No orders of the defined types were placed in this encounter.

## 2023-01-09 NOTE — Patient Instructions (Signed)
Please return in 12 months for your annual complete physical; please come fasting.   I will release your lab results to you on your MyChart account with further instructions. You may see the results before I do, but when I review them I will send you a message with my report or have my assistant call you if things need to be discussed. Please reply to my message with any questions. Thank you!   If you have any questions or concerns, please don't hesitate to send me a message via MyChart or call the office at [phone]. Thank you for visiting with us today! It's our pleasure caring for you.  

## 2023-01-15 DIAGNOSIS — G4733 Obstructive sleep apnea (adult) (pediatric): Secondary | ICD-10-CM | POA: Diagnosis not present

## 2023-01-15 DIAGNOSIS — E291 Testicular hypofunction: Secondary | ICD-10-CM | POA: Diagnosis not present

## 2023-01-15 DIAGNOSIS — Z7982 Long term (current) use of aspirin: Secondary | ICD-10-CM | POA: Diagnosis not present

## 2023-01-15 DIAGNOSIS — K219 Gastro-esophageal reflux disease without esophagitis: Secondary | ICD-10-CM | POA: Diagnosis not present

## 2023-01-15 DIAGNOSIS — E785 Hyperlipidemia, unspecified: Secondary | ICD-10-CM | POA: Diagnosis not present

## 2023-01-15 DIAGNOSIS — M48061 Spinal stenosis, lumbar region without neurogenic claudication: Secondary | ICD-10-CM | POA: Diagnosis not present

## 2023-01-15 DIAGNOSIS — K5900 Constipation, unspecified: Secondary | ICD-10-CM | POA: Diagnosis not present

## 2023-01-15 DIAGNOSIS — M50921 Unspecified cervical disc disorder at C4-C5 level: Secondary | ICD-10-CM | POA: Diagnosis not present

## 2023-01-15 DIAGNOSIS — J309 Allergic rhinitis, unspecified: Secondary | ICD-10-CM | POA: Diagnosis not present

## 2023-01-15 DIAGNOSIS — Z85828 Personal history of other malignant neoplasm of skin: Secondary | ICD-10-CM | POA: Diagnosis not present

## 2023-01-15 DIAGNOSIS — H409 Unspecified glaucoma: Secondary | ICD-10-CM | POA: Diagnosis not present

## 2023-01-15 DIAGNOSIS — R1310 Dysphagia, unspecified: Secondary | ICD-10-CM | POA: Diagnosis not present

## 2023-01-15 DIAGNOSIS — M1990 Unspecified osteoarthritis, unspecified site: Secondary | ICD-10-CM | POA: Diagnosis not present

## 2023-01-15 DIAGNOSIS — E039 Hypothyroidism, unspecified: Secondary | ICD-10-CM | POA: Diagnosis not present

## 2023-01-15 DIAGNOSIS — Z79899 Other long term (current) drug therapy: Secondary | ICD-10-CM | POA: Diagnosis not present

## 2023-01-15 DIAGNOSIS — Z9181 History of falling: Secondary | ICD-10-CM | POA: Diagnosis not present

## 2023-01-15 DIAGNOSIS — G231 Progressive supranuclear ophthalmoplegia [Steele-Richardson-Olszewski]: Secondary | ICD-10-CM | POA: Diagnosis not present

## 2023-01-15 DIAGNOSIS — G20C Parkinsonism, unspecified: Secondary | ICD-10-CM | POA: Diagnosis not present

## 2023-01-15 DIAGNOSIS — G4700 Insomnia, unspecified: Secondary | ICD-10-CM | POA: Diagnosis not present

## 2023-01-15 DIAGNOSIS — E559 Vitamin D deficiency, unspecified: Secondary | ICD-10-CM | POA: Diagnosis not present

## 2023-01-17 DIAGNOSIS — M48061 Spinal stenosis, lumbar region without neurogenic claudication: Secondary | ICD-10-CM | POA: Diagnosis not present

## 2023-01-17 DIAGNOSIS — M50921 Unspecified cervical disc disorder at C4-C5 level: Secondary | ICD-10-CM | POA: Diagnosis not present

## 2023-01-17 DIAGNOSIS — E785 Hyperlipidemia, unspecified: Secondary | ICD-10-CM | POA: Diagnosis not present

## 2023-01-17 DIAGNOSIS — G231 Progressive supranuclear ophthalmoplegia [Steele-Richardson-Olszewski]: Secondary | ICD-10-CM | POA: Diagnosis not present

## 2023-01-17 DIAGNOSIS — R1310 Dysphagia, unspecified: Secondary | ICD-10-CM | POA: Diagnosis not present

## 2023-01-17 DIAGNOSIS — M1990 Unspecified osteoarthritis, unspecified site: Secondary | ICD-10-CM | POA: Diagnosis not present

## 2023-01-22 ENCOUNTER — Encounter: Payer: Self-pay | Admitting: Adult Health

## 2023-01-22 ENCOUNTER — Ambulatory Visit (INDEPENDENT_AMBULATORY_CARE_PROVIDER_SITE_OTHER): Payer: Medicare Other | Admitting: Adult Health

## 2023-01-22 VITALS — BP 133/65 | HR 55 | Ht 70.0 in | Wt 180.0 lb

## 2023-01-22 DIAGNOSIS — R1310 Dysphagia, unspecified: Secondary | ICD-10-CM

## 2023-01-22 DIAGNOSIS — G231 Progressive supranuclear ophthalmoplegia [Steele-Richardson-Olszewski]: Secondary | ICD-10-CM

## 2023-01-22 DIAGNOSIS — Z9181 History of falling: Secondary | ICD-10-CM | POA: Diagnosis not present

## 2023-01-22 NOTE — Progress Notes (Signed)
PATIENT: [name] DOB: 1946/07/20  REASON FOR VISIT: follow up HISTORY FROM: patient PRIMARY NEUROLOGIST: Dr. Frances Furbish  Chief Complaint  Patient presents with   Follow-up    [name] with wife and daughter Pt is having issues swallowing. Wife states pt coughs while eating  and increased drooling      HISTORY OF PRESENT ILLNESS: Today 01/22/23:  [name] is a 77 y.o. male with a history of progressive supranuclear palsy. Returns today for follow-up.  His wife provides most of the information.  They have aides that are there in the morning and in the evening.  He requires assistance with most ADLs.  Wife notes that he does have some trouble with swallowing although this has been ongoing.  She does  pure his food.  Patient complains of neuropathy in the lower extremities.  Reports that he usually gets discomfort at night but typically aleve will resolve this.  Wife reports that he does walk daily.   07/24/22: He reports very little, his wife reports his history and reports that he has had further mobility decline, he recently fell and skinned his left upper extremity, can only walk short distances with a walker and a gait belt and a person with him.  She would like to initiate home health therapy and get an aide also at home as he is incontinent and needs help with hygiene.     01/22/22: [name] is a 77 year old male with a history of progressive supranuclear palsy.he returns today for follow-up. Wife feels that things have remained stable. Patient reports that he has fallen more. Fell 2 weeks ago- tried to make a quick turn and fell. PT did work with the patient back in the fall. Good appetite. Food is pureed. Uses a shower chair and wife assist with ADLS. Able to feed himself.  HISTORY 07/12/2021: He reports very little, he is hard to understand, wife provides his history.  He has fallen, has skinned his knees primarily per his report and she reports that he has not had any  serious injuries, uses his walker at all times including in the house.  He has not had any recent formal therapy.  We mutually agreed to pursue home health therapy.  He worked with therapist Ranae Palms before with [facility] home [facility].  Their daughters help out on the weekends primarily.  He has not had any recent constipation although he reports that he feels stopped up, wife reports that he has soft stools, he continues to eat pured food.    REVIEW OF SYSTEMS: Out of a complete 14 system review of symptoms, the patient complains only of the following symptoms, and all other reviewed systems are negative.  See HPI  ALLERGIES: Allergies  Allergen Reactions   Ambien [Zolpidem Tartrate]     Unknown- balance   Atenolol     unknown   Lunesta [Eszopiclone]     unknown   Oxazepam     unknown   Requip [Ropinirole Hcl]     unknown   Ziac [Bisoprolol-Hydrochlorothiazide]     unknown    HOME MEDICATIONS: Outpatient Medications Prior to Visit  Medication Sig Dispense Refill   aspirin 81 MG tablet Take 81 mg by mouth daily.     Azelastine HCl 137 MCG/SPRAY SOLN PLACE 1 SPRAY INTO BOTH NOSTRILS 2 (TWO) TIMES DAILY 30 mL 11   Cholecalciferol (VITAMIN D) 2000 units CAPS Take 1 capsule by mouth once a week.  fexofenadine (ALLEGRA) 60 MG tablet Take 60 mg by mouth daily.      finasteride (PROSCAR) 5 MG tablet Take 5 mg by mouth daily.  11   fluticasone (FLONASE) 50 MCG/ACT nasal spray SPRAY 1 SPRAY INTO EACH NOSTRIL EVERY DAY 48 mL 2   levothyroxine (SYNTHROID) 50 MCG tablet TAKE 1 TABLET BY MOUTH EVERY DAY 90 tablet 3   Multiple Vitamins-Minerals (MULTIVITAMIN WITH MINERALS) tablet Take 1 tablet by mouth daily.     Naproxen Sodium 220 MG CAPS Take by mouth. Once a month prn     polyethylene glycol (MIRALAX / GLYCOLAX) 17 g packet Take 17 g by mouth daily.     rosuvastatin (CRESTOR) 5 MG tablet TAKE 1 TABLET BY MOUTH EVERY DAY 90 tablet 3   timolol (BETIMOL) 0.5 % ophthalmic solution  Place 1 drop into both eyes 2 (two) times daily.      No facility-administered medications prior to visit.    PAST MEDICAL HISTORY: Past Medical History:  Diagnosis Date   Allergic rhinitis    Allergy    Arthritis    At risk for falls    Cancer    basal cell skin cancer   Cataract    Complication of anesthesia    Slow to awaken   Constipation    DDD (degenerative disc disease), cervical    Dysphagia    GERD (gastroesophageal reflux disease)    Decdreased, has decreased coffee intack.   Glaucoma    HTN (hypertension)    Hx of adenomatous colonic polyps 01/23/2016   Hyperlipidemia    Hypogonadism male    Hypothyroidism    OSA (obstructive sleep apnea)    Has had surgery x 2   Parkinsonism 01/16/2013   no tremors, balance issues only , speech and swallowing difficulties    Sleep apnea    surgery x 2- no cpap   Staph infection 10-2014   Thyroid disease    HYPOTHYROID    PAST SURGICAL HISTORY: Past Surgical History:  Procedure Laterality Date   CATARACT EXTRACTION     COLONOSCOPY  04-27-2004   tics, hems    I & D EXTREMITY Right 10/18/2014   Procedure: IRRIGATION AND DEBRIDEMENT RIGHT KNEE PREPATELLA BURSA INFECTION;  Surgeon: Nadara Mustard, MD;  Location: [facility] OR;  Service: Orthopedics;  Laterality: Right;   LUMBAR DISC SURGERY  04-2015   LUMBAR LAMINECTOMY WITH COFLEX 1 LEVEL N/A 06/06/2015   Procedure: Lumbar three-four Laminectomy/Foraminotomy with placement of Coflex;  Surgeon: Barnett Abu, MD;  Location: [facility];  Service: Neurosurgery;  Laterality: N/A;   NASAL SEPTUM SURGERY     TONSILLECTOMY     and adenoids   UVULOPALATOPHARYNGOPLASTY      FAMILY HISTORY: Family History  Problem Relation Age of Onset   Emphysema Mother    COPD Mother    Heart disease Mother    Hypertension Mother    Liver cancer Father    Cancer Father    Diabetes Father    Hypertension Father    Colon polyps Father    Heart disease Sister    COPD Sister    Heart attack Sister     Hypertension Sister    Colon polyps Brother    Stroke Neg Hx    Colon cancer Neg Hx    Parkinson's disease Neg Hx     SOCIAL HISTORY: Social History   Socioeconomic History   Marital status: Married    Spouse name: linda   Number of children:  2   Years of education: college   Highest education level: Not on file  Occupational History   Occupation: branch Camera operator    Employer: RETIRED  Tobacco Use   Smoking status: Never   Smokeless tobacco: Never  Vaping Use   Vaping Use: Never used  Substance and Sexual Activity   Alcohol use: No    Alcohol/week: 0.0 standard drinks of alcohol   Drug use: No   Sexual activity: Not on file  Other Topics Concern   Not on file  Social History Narrative   3 Grandchildren    Social Determinants of Health   Financial Resource Strain: Low Risk  (02/19/2022)   Overall Financial Resource Strain (CARDIA)    Difficulty of Paying Living Expenses: Not hard at all  Food Insecurity: No Food Insecurity (02/19/2022)   Hunger Vital Sign    Worried About Running Out of Food in the Last Year: Never true    Ran Out of Food in the Last Year: Never true  Transportation Needs: No Transportation Needs (02/19/2022)   PRAPARE - Administrator, Civil Service (Medical): No    Lack of Transportation (Non-Medical): No  Physical Activity: Sufficiently Active (02/19/2022)   Exercise Vital Sign    Days of Exercise per Week: 7 days    Minutes of Exercise per Session: 30 min  Stress: No Stress Concern Present (02/19/2022)   [employer] of Occupational Health - Occupational Stress Questionnaire    Feeling of Stress : Not at all  Social Connections: Moderately Isolated (02/19/2022)   Social Connection and Isolation Panel [NHANES]    Frequency of Communication with Friends and Family: Twice a week    Frequency of Social Gatherings with Friends and Family: Twice a week    Attends Religious Services: Never    Database administrator or  Organizations: No    Attends Banker Meetings: Never    Marital Status: Married  Catering manager Violence: Not At Risk (02/19/2022)   Humiliation, Afraid, Rape, and Kick questionnaire    Fear of Current or Ex-Partner: No    Emotionally Abused: No    Physically Abused: No    Sexually Abused: No      PHYSICAL EXAM  There were no vitals filed for this visit.  There is no height or weight on file to calculate BMI.  Generalized: Well developed, in no acute distress   Neurological examination  Mentation: Alert oriented to time, place, history taking. Follows all commands speech is garbled. Cranial nerve II-XII: Pupils were equal round reactive to light. Extraocular movements were full, with the exception decreased vertical gaze. head turning and shoulder shrug  were normal and symmetric. Motor: The motor testing reveals 5 over 5 strength of all 4 extremities with exception of bilateral foot drop right worse than left Sensory: Sensory testing is intact to soft touch on all 4 extremities. No evidence of extinction is noted.  Coordination: Cerebellar testing reveals good finger-nose-finger and heel-to-shin bilaterally.  Gait and station: Patient has a foot drop tends to lift the right leg more when ambulating.  Tandem gait not attempted.  Uses a walker   DIAGNOSTIC DATA (LABS, IMAGING, TESTING) - I reviewed patient records, labs, notes, testing and imaging myself where available.  Lab Results  Component Value Date   WBC 6.6 01/09/2023   HGB 14.8 01/09/2023   HCT 43.7 01/09/2023   MCV 91.4 01/09/2023   PLT 224.0 01/09/2023      Component Value  Date/Time   NA 140 01/09/2023 1329   K 4.3 01/09/2023 1329   CL 104 01/09/2023 1329   CO2 29 01/09/2023 1329   GLUCOSE 101 (H) 01/09/2023 1329   BUN 11 01/09/2023 1329   CREATININE 0.67 01/09/2023 1329   CREATININE 0.68 (L) 09/23/2020 1356   CALCIUM 9.6 01/09/2023 1329   PROT 6.6 01/09/2023 1329   ALBUMIN 4.4 01/09/2023  1329   AST 19 01/09/2023 1329   ALT 19 01/09/2023 1329   ALKPHOS 50 01/09/2023 1329   BILITOT 0.5 01/09/2023 1329   GFRNONAA 95 09/23/2020 1356   GFRAA 110 09/23/2020 1356   Lab Results  Component Value Date   CHOL 121 01/09/2023   HDL 41.30 01/09/2023   LDLCALC 47 01/09/2023   LDLDIRECT 107.0 02/18/2018   TRIG 163.0 (H) 01/09/2023   CHOLHDL 3 01/09/2023   Lab Results  Component Value Date   HGBA1C 5.0 11/08/2016   Lab Results  Component Value Date   VITAMINB12 433 11/08/2016   Lab Results  Component Value Date   TSH 1.78 01/09/2023      ASSESSMENT AND PLAN 77 y.o. year old male  has a past medical history of Allergic rhinitis, Allergy, Arthritis, At risk for falls, Cancer, Cataract, Complication of anesthesia, Constipation, DDD (degenerative disc disease), cervical, Dysphagia, GERD (gastroesophageal reflux disease), Glaucoma, HTN (hypertension), adenomatous colonic polyps (01/23/2016), Hyperlipidemia, Hypogonadism male, Hypothyroidism, OSA (obstructive sleep apnea), Parkinsonism (01/16/2013), Sleep apnea, Staph infection (10-2014), and Thyroid disease. here with:  Progressive super nuclear palsy  Continue to monitor symptoms Monitor swallowing.  Can do another swallow evaluation if needed We will follow-up in 6 months or sooner if needed.     Butch Penny, MSN, NP-C 01/22/2023, 1:21 PM [facility] [address] [place], [place] [address] [phone]

## 2023-01-29 DIAGNOSIS — H401132 Primary open-angle glaucoma, bilateral, moderate stage: Secondary | ICD-10-CM | POA: Diagnosis not present

## 2023-01-29 DIAGNOSIS — H04123 Dry eye syndrome of bilateral lacrimal glands: Secondary | ICD-10-CM | POA: Diagnosis not present

## 2023-01-31 DIAGNOSIS — G231 Progressive supranuclear ophthalmoplegia [Steele-Richardson-Olszewski]: Secondary | ICD-10-CM | POA: Diagnosis not present

## 2023-01-31 DIAGNOSIS — M50921 Unspecified cervical disc disorder at C4-C5 level: Secondary | ICD-10-CM | POA: Diagnosis not present

## 2023-01-31 DIAGNOSIS — R1310 Dysphagia, unspecified: Secondary | ICD-10-CM | POA: Diagnosis not present

## 2023-01-31 DIAGNOSIS — E785 Hyperlipidemia, unspecified: Secondary | ICD-10-CM | POA: Diagnosis not present

## 2023-01-31 DIAGNOSIS — M48061 Spinal stenosis, lumbar region without neurogenic claudication: Secondary | ICD-10-CM | POA: Diagnosis not present

## 2023-01-31 DIAGNOSIS — M1990 Unspecified osteoarthritis, unspecified site: Secondary | ICD-10-CM | POA: Diagnosis not present

## 2023-02-12 DIAGNOSIS — M48061 Spinal stenosis, lumbar region without neurogenic claudication: Secondary | ICD-10-CM | POA: Diagnosis not present

## 2023-02-12 DIAGNOSIS — R1310 Dysphagia, unspecified: Secondary | ICD-10-CM | POA: Diagnosis not present

## 2023-02-12 DIAGNOSIS — G231 Progressive supranuclear ophthalmoplegia [Steele-Richardson-Olszewski]: Secondary | ICD-10-CM | POA: Diagnosis not present

## 2023-02-12 DIAGNOSIS — M50921 Unspecified cervical disc disorder at C4-C5 level: Secondary | ICD-10-CM | POA: Diagnosis not present

## 2023-02-12 DIAGNOSIS — M1990 Unspecified osteoarthritis, unspecified site: Secondary | ICD-10-CM | POA: Diagnosis not present

## 2023-02-12 DIAGNOSIS — E785 Hyperlipidemia, unspecified: Secondary | ICD-10-CM | POA: Diagnosis not present

## 2023-02-14 DIAGNOSIS — M48061 Spinal stenosis, lumbar region without neurogenic claudication: Secondary | ICD-10-CM | POA: Diagnosis not present

## 2023-02-14 DIAGNOSIS — I10 Essential (primary) hypertension: Secondary | ICD-10-CM | POA: Diagnosis not present

## 2023-02-14 DIAGNOSIS — E785 Hyperlipidemia, unspecified: Secondary | ICD-10-CM | POA: Diagnosis not present

## 2023-02-14 DIAGNOSIS — R1310 Dysphagia, unspecified: Secondary | ICD-10-CM | POA: Diagnosis not present

## 2023-02-14 DIAGNOSIS — G4733 Obstructive sleep apnea (adult) (pediatric): Secondary | ICD-10-CM | POA: Diagnosis not present

## 2023-02-14 DIAGNOSIS — E291 Testicular hypofunction: Secondary | ICD-10-CM | POA: Diagnosis not present

## 2023-02-14 DIAGNOSIS — Z7982 Long term (current) use of aspirin: Secondary | ICD-10-CM | POA: Diagnosis not present

## 2023-02-14 DIAGNOSIS — Z79899 Other long term (current) drug therapy: Secondary | ICD-10-CM | POA: Diagnosis not present

## 2023-02-14 DIAGNOSIS — G20C Parkinsonism, unspecified: Secondary | ICD-10-CM | POA: Diagnosis not present

## 2023-02-14 DIAGNOSIS — G231 Progressive supranuclear ophthalmoplegia [Steele-Richardson-Olszewski]: Secondary | ICD-10-CM | POA: Diagnosis not present

## 2023-02-14 DIAGNOSIS — K219 Gastro-esophageal reflux disease without esophagitis: Secondary | ICD-10-CM | POA: Diagnosis not present

## 2023-02-14 DIAGNOSIS — K5900 Constipation, unspecified: Secondary | ICD-10-CM | POA: Diagnosis not present

## 2023-02-14 DIAGNOSIS — M1990 Unspecified osteoarthritis, unspecified site: Secondary | ICD-10-CM | POA: Diagnosis not present

## 2023-02-14 DIAGNOSIS — J309 Allergic rhinitis, unspecified: Secondary | ICD-10-CM | POA: Diagnosis not present

## 2023-02-14 DIAGNOSIS — E039 Hypothyroidism, unspecified: Secondary | ICD-10-CM | POA: Diagnosis not present

## 2023-02-14 DIAGNOSIS — Z85828 Personal history of other malignant neoplasm of skin: Secondary | ICD-10-CM | POA: Diagnosis not present

## 2023-02-14 DIAGNOSIS — G4700 Insomnia, unspecified: Secondary | ICD-10-CM | POA: Diagnosis not present

## 2023-02-14 DIAGNOSIS — Z9181 History of falling: Secondary | ICD-10-CM | POA: Diagnosis not present

## 2023-02-14 DIAGNOSIS — M50921 Unspecified cervical disc disorder at C4-C5 level: Secondary | ICD-10-CM | POA: Diagnosis not present

## 2023-02-14 DIAGNOSIS — E559 Vitamin D deficiency, unspecified: Secondary | ICD-10-CM | POA: Diagnosis not present

## 2023-02-14 DIAGNOSIS — H409 Unspecified glaucoma: Secondary | ICD-10-CM | POA: Diagnosis not present

## 2023-02-28 DIAGNOSIS — M1990 Unspecified osteoarthritis, unspecified site: Secondary | ICD-10-CM | POA: Diagnosis not present

## 2023-02-28 DIAGNOSIS — R1310 Dysphagia, unspecified: Secondary | ICD-10-CM | POA: Diagnosis not present

## 2023-02-28 DIAGNOSIS — M50921 Unspecified cervical disc disorder at C4-C5 level: Secondary | ICD-10-CM | POA: Diagnosis not present

## 2023-02-28 DIAGNOSIS — G231 Progressive supranuclear ophthalmoplegia [Steele-Richardson-Olszewski]: Secondary | ICD-10-CM | POA: Diagnosis not present

## 2023-02-28 DIAGNOSIS — M48061 Spinal stenosis, lumbar region without neurogenic claudication: Secondary | ICD-10-CM | POA: Diagnosis not present

## 2023-02-28 DIAGNOSIS — E785 Hyperlipidemia, unspecified: Secondary | ICD-10-CM | POA: Diagnosis not present

## 2023-03-12 ENCOUNTER — Ambulatory Visit (INDEPENDENT_AMBULATORY_CARE_PROVIDER_SITE_OTHER): Payer: Medicare Other

## 2023-03-12 VITALS — Wt 180.0 lb

## 2023-03-12 DIAGNOSIS — Z0000 Encounter for general adult medical examination without abnormal findings: Secondary | ICD-10-CM | POA: Diagnosis not present

## 2023-03-12 NOTE — Progress Notes (Signed)
I connected with  [name] on 03/12/23 by a audio enabled telemedicine application and verified that I am speaking with the correct person using two identifiers.  Patient Location: Home  Provider Location: Office/Clinic  I discussed the limitations of evaluation and management by telemedicine. The patient expressed understanding and agreed to proceed.   Subjective:   [name] is a 77 y.o. male who presents for Medicare Annual/Subsequent preventive examination.  Review of Systems     Cardiac Risk Factors include: advanced age (>71men, >57 women);male gender;dyslipidemia;hypertension     Objective:    Today's Vitals   03/12/23 1413  Weight: 180 lb (81.6 kg)   Body mass index is 25.83 kg/m.     03/12/2023    2:20 PM 02/19/2022    1:48 PM 02/13/2021    2:02 PM 02/15/2020   12:36 PM 01/11/2020    3:29 PM 01/15/2018    1:30 PM 01/14/2017    2:50 PM  Advanced Directives  Does Patient Have a Medical Advance Directive? Yes Yes Yes Yes Yes Yes Yes  Type of Estate agent of [place];Living will Healthcare Power of [employer] Power of [employer] Power of [place];Living will Living will;Healthcare Power of [employer] Power of [place];Living will  Does patient want to make changes to medical advance directive? No - Patient declined   No - Patient declined No - Patient declined    Copy of Healthcare Power of Attorney in Chart? Yes - validated most recent copy scanned in chart (See row information) Yes - validated most recent copy scanned in chart (See row information) No - copy requested  No - copy requested      Current Medications (verified) Outpatient Encounter Medications as of 03/12/2023  Medication Sig   aspirin 81 MG tablet Take 81 mg by mouth daily.   Azelastine HCl 137 MCG/SPRAY SOLN PLACE 1 SPRAY INTO BOTH NOSTRILS 2 (TWO) TIMES DAILY   Cholecalciferol (VITAMIN D) 2000 units CAPS Take 1 capsule by mouth once a week.     fexofenadine (ALLEGRA) 60 MG tablet Take 60 mg by mouth daily.    finasteride (PROSCAR) 5 MG tablet Take 5 mg by mouth daily.   fluticasone (FLONASE) 50 MCG/ACT nasal spray SPRAY 1 SPRAY INTO EACH NOSTRIL EVERY DAY   latanoprost (XALATAN) 0.005 % ophthalmic solution 1 drop at bedtime.   levothyroxine (SYNTHROID) 50 MCG tablet TAKE 1 TABLET BY MOUTH EVERY DAY   Multiple Vitamins-Minerals (MULTIVITAMIN WITH MINERALS) tablet Take 1 tablet by mouth daily.   Naproxen Sodium 220 MG CAPS Take by mouth. Once a month prn   polyethylene glycol (MIRALAX / GLYCOLAX) 17 g packet Take 17 g by mouth daily.   rosuvastatin (CRESTOR) 5 MG tablet TAKE 1 TABLET BY MOUTH EVERY DAY   timolol (BETIMOL) 0.5 % ophthalmic solution Place 1 drop into both eyes 2 (two) times daily.    No facility-administered encounter medications on file as of 03/12/2023.    Allergies (verified) Ambien [zolpidem tartrate], Atenolol, Lunesta [eszopiclone], Oxazepam, Requip [ropinirole hcl], and Ziac [bisoprolol-hydrochlorothiazide]   History: Past Medical History:  Diagnosis Date   Allergic rhinitis    Allergy    Arthritis    At risk for falls    Cancer (HCC)    basal cell skin cancer   Cataract    Complication of anesthesia    Slow to awaken   Constipation    DDD (degenerative disc disease), cervical    Dysphagia    GERD (gastroesophageal reflux  disease)    Decdreased, has decreased coffee intack.   Glaucoma    HTN (hypertension)    Hx of adenomatous colonic polyps 01/23/2016   Hyperlipidemia    Hypogonadism male    Hypothyroidism    OSA (obstructive sleep apnea)    Has had surgery x 2   Parkinsonism 01/16/2013   no tremors, balance issues only , speech and swallowing difficulties    Sleep apnea    surgery x 2- no cpap   Staph infection 10-2014   Thyroid disease    HYPOTHYROID   Past Surgical History:  Procedure Laterality Date   CATARACT EXTRACTION     COLONOSCOPY  04-27-2004   tics, hems    I & D EXTREMITY  Right 10/18/2014   Procedure: IRRIGATION AND DEBRIDEMENT RIGHT KNEE PREPATELLA BURSA INFECTION;  Surgeon: Nadara Mustard, MD;  Location: [facility] OR;  Service: Orthopedics;  Laterality: Right;   LUMBAR DISC SURGERY  04-2015   LUMBAR LAMINECTOMY WITH COFLEX 1 LEVEL N/A 06/06/2015   Procedure: Lumbar three-four Laminectomy/Foraminotomy with placement of Coflex;  Surgeon: Barnett Abu, MD;  Location: [facility];  Service: Neurosurgery;  Laterality: N/A;   NASAL SEPTUM SURGERY     TONSILLECTOMY     and adenoids   UVULOPALATOPHARYNGOPLASTY     Family History  Problem Relation Age of Onset   Emphysema Mother    COPD Mother    Heart disease Mother    Hypertension Mother    Liver cancer Father    Cancer Father    Diabetes Father    Hypertension Father    Colon polyps Father    Heart disease Sister    COPD Sister    Heart attack Sister    Hypertension Sister    Colon polyps Brother    Stroke Neg Hx    Colon cancer Neg Hx    Parkinson's disease Neg Hx    Social History   Socioeconomic History   Marital status: Married    Spouse name: linda   Number of children: 2   Years of education: college   Highest education level: Not on file  Occupational History   Occupation: branch Psychologist, occupational: RETIRED  Tobacco Use   Smoking status: Never   Smokeless tobacco: Never  Vaping Use   Vaping Use: Never used  Substance and Sexual Activity   Alcohol use: No    Alcohol/week: 0.0 standard drinks of alcohol   Drug use: No   Sexual activity: Not on file  Other Topics Concern   Not on file  Social History Narrative   3 Grandchildren    Social Determinants of Health   Financial Resource Strain: Low Risk  (03/12/2023)   Overall Financial Resource Strain (CARDIA)    Difficulty of Paying Living Expenses: Not hard at all  Food Insecurity: No Food Insecurity (03/12/2023)   Hunger Vital Sign    Worried About Running Out of Food in the Last Year: Never true    Ran Out of Food in the  Last Year: Never true  Transportation Needs: No Transportation Needs (03/12/2023)   PRAPARE - Administrator, Civil Service (Medical): No    Lack of Transportation (Non-Medical): No  Physical Activity: Sufficiently Active (03/12/2023)   Exercise Vital Sign    Days of Exercise per Week: 7 days    Minutes of Exercise per Session: 60 min  Stress: No Stress Concern Present (03/12/2023)   [employer] of Occupational Health - Occupational  Stress Questionnaire    Feeling of Stress : Not at all  Social Connections: Moderately Isolated (03/12/2023)   Social Connection and Isolation Panel [NHANES]    Frequency of Communication with Friends and Family: Once a week    Frequency of Social Gatherings with Friends and Family: Three times a week    Attends Religious Services: Never    Active Member of Clubs or Organizations: No    Attends Engineer, structural: Never    Marital Status: Married    Tobacco Counseling Counseling given: Not Answered   Clinical Intake:  Pre-visit preparation completed: Yes  Pain : No/denies pain     BMI - recorded: 25.83 Nutritional Status: BMI 25 -29 Overweight Nutritional Risks: None Diabetes: No  How often do you need to have someone help you when you read instructions, pamphlets, or other written materials from your doctor or pharmacy?: 1 - Never  Diabetic?no  Interpreter Needed?: No  Information entered by :: Lanier Ensign, LPN   Activities of Daily Living    03/12/2023    2:21 PM  In your present state of health, do you have any difficulty performing the following activities:  Hearing? 0  Vision? 0  Difficulty concentrating or making decisions? 0  Walking or climbing stairs? 0  Dressing or bathing? 1  Comment assistance  Doing errands, shopping? 0  Preparing Food and eating ? Y  Comment wife  Using the Toilet? Y  Comment has assist  In the past six months, have you accidently leaked urine? Y  Comment incontinent  Do  you have problems with loss of bowel control? Y  Comment incontinent  Managing your Medications? Y  Comment wife assist  Managing your Finances? N  Housekeeping or managing your Housekeeping? N    Patient Care Team: Willow Ora, MD as PCP - General (Family Medicine) Quintella Reichert, MD as PCP - Cardiology (Cardiology) Alfredo Martinez, MD as Consulting Physician (Urology) Mckinley Jewel, MD as Consulting Physician (Ophthalmology) Huston Foley, MD as Attending Physician (Neurology)  Indicate any recent Medical Services you may have received from other than [facility] providers in the past year (date may be approximate).     Assessment:   This is a routine wellness examination for [place].  Hearing/Vision screen Hearing Screening - Comments:: Pt denies any hearing issues  Vision Screening - Comments:: Pt follows up with Dr Cathey Endow for annual eye exams   Dietary issues and exercise activities discussed: Current Exercise Habits: Home exercise routine, Type of exercise: Other - see comments (PT and home exercise), Time (Minutes): 60, Frequency (Times/Week): 7, Weekly Exercise (Minutes/Week): 420   Goals Addressed             This Visit's Progress    Patient Stated       Maintain health        Depression Screen    03/12/2023    2:18 PM 01/09/2023    1:11 PM 02/19/2022    1:47 PM 02/13/2021    2:00 PM 01/11/2020    3:31 PM 04/22/2019    1:05 PM 01/15/2018    1:34 PM  PHQ 2/9 Scores  PHQ - 2 Score 0 0 0 0 0 0 0  PHQ- 9 Score      0     Fall Risk    03/12/2023    2:20 PM 01/09/2023    1:07 PM 02/19/2022    1:49 PM 12/27/2021   12:59 PM 02/13/2021    2:03 PM  Fall Risk   Falls in the past year? 1 1 1 1 1   Number falls in past yr: 1 0 1 1 1   Injury with Fall? 0 0 1 1 1   Comment   scratches    Risk for fall due to : Impaired balance/gait;Impaired mobility;Impaired vision;History of fall(s) History of fall(s) Impaired balance/gait;Impaired mobility;Impaired vision;History of  fall(s) Impaired balance/gait Impaired vision;Impaired balance/gait;Impaired mobility;History of fall(s)  Follow up Falls prevention discussed Falls evaluation completed Falls prevention discussed Falls prevention discussed;Falls evaluation completed Falls prevention discussed    FALL RISK PREVENTION PERTAINING TO THE HOME:  Any stairs in or around the home? Yes  If so, are there any without handrails? No  Home free of loose throw rugs in walkways, pet beds, electrical cords, etc? Yes  Adequate lighting in your home to reduce risk of falls? Yes   ASSISTIVE DEVICES UTILIZED TO PREVENT FALLS:  Life alert? No  Use of a cane, walker or w/c? Yes  Grab bars in the bathroom? Yes  Shower chair or bench in shower? Yes  Elevated toilet seat or a handicapped toilet? Yes   TIMED UP AND GO:  Was the test performed? No .   Cognitive Function:    11/19/2016   10:43 AM 04/30/2016    1:14 PM 10/31/2015    1:10 PM 11/18/2014    2:25 PM  MMSE - Mini Mental State Exam  Orientation to time 5 5 5 5   Orientation to Place 5 5 5 5   Registration 3 3 3 3   Attention/ Calculation 5 3 5 5   Recall 3 3 3 3   Language- name 2 objects 2 2 2 2   Language- repeat 1 1 1 1   Language- follow 3 step command 3 3 3 3   Language- read & follow direction 1 1 1 1   Write a sentence 1 1 1 1   Copy design 1 1 1 1   Total score 30 28 30 30         03/12/2023    2:24 PM 02/19/2022    1:54 PM 02/13/2021    2:06 PM  6CIT Screen  What Year? 0 points 0 points 0 points  What month? 0 points 0 points 0 points  What time? 0 points 0 points   Count back from 20 0 points 0 points 0 points  Months in reverse 0 points 4 points 0 points  Repeat phrase 0 points 0 points 0 points  Total Score 0 points 4 points     Immunizations Immunization History  Administered Date(s) Administered   COVID-19, mRNA, vaccine(Comirnaty)12 years and older 08/10/2022   Fluad Quad(high Dose 65+) 07/08/2019, 07/12/2020   Influenza Whole 08/29/2010,  07/09/2011, 07/21/2012   Influenza, High Dose Seasonal PF 07/24/2013, 07/15/2015, 06/23/2016, 07/04/2017, 07/07/2022   Influenza-Unspecified 07/23/2014, 07/04/2017, 07/08/2018   Moderna Sars-Covid-2 Vaccination 11/23/2019, 12/22/2019   PFIZER(Purple Top)SARS-COV-2 Vaccination 08/03/2020, 02/25/2021   Pfizer Covid-19 Vaccine Bivalent Booster 56yrs & up 07/14/2021   Pneumococcal Conjugate-13 05/13/2014   Pneumococcal Polysaccharide-23 10/08/2002, 08/12/2015   Respiratory Syncytial Virus Vaccine,Recomb Aduvanted(Arexvy) 08/17/2022   Tdap 07/08/2010, 07/14/2019   Zoster, Live 07/09/2007    TDAP status: Up to date  Flu Vaccine status: Up to date  Pneumococcal vaccine status: Up to date  Covid-19 vaccine status: Completed vaccines  Qualifies for Shingles Vaccine? Yes   Zostavax completed No   Shingrix Completed?: No.    Education has been provided regarding the importance of this vaccine. Patient has been advised to call insurance company to  determine out of pocket expense if they have not yet received this vaccine. Advised may also receive vaccine at local pharmacy or Health Dept. Verbalized acceptance and understanding.  Screening Tests Health Maintenance  Topic Date Due   Zoster Vaccines- Shingrix (1 of 2) Never done   INFLUENZA VACCINE  05/09/2023   Medicare Annual Wellness (AWV)  03/11/2024   DTaP/Tdap/Td (3 - Td or Tdap) 07/13/2029   Pneumonia Vaccine 29+ Years old  Completed   COVID-19 Vaccine  Completed   Hepatitis C Screening  Completed   HPV VACCINES  Aged Out   Colonoscopy  Discontinued    Health Maintenance  Health Maintenance Due  Topic Date Due   Zoster Vaccines- Shingrix (1 of 2) Never done    Colorectal cancer screening: No longer required.    Additional Screening:  Hepatitis C Screening:  Completed 09/30/15  Vision Screening: Recommended annual ophthalmology exams for early detection of glaucoma and other disorders of the eye. Is the patient up to date  with their annual eye exam?  Yes  Who is the provider or what is the name of the office in which the patient attends annual eye exams? Dr Cathey Endow  If pt is not established with a provider, would they like to be referred to a provider to establish care? No .   Dental Screening: Recommended annual dental exams for proper oral hygiene  Community Resource Referral / Chronic Care Management: CRR required this visit?  No   CCM required this visit?  No      Plan:     I have personally reviewed and noted the following in the patient's chart:   Medical and social history Use of alcohol, tobacco or illicit drugs  Current medications and supplements including opioid prescriptions. Patient is not currently taking opioid prescriptions. Functional ability and status Nutritional status Physical activity Advanced directives List of other physicians Hospitalizations, surgeries, and ER visits in previous 12 months Vitals Screenings to include cognitive, depression, and falls Referrals and appointments  In addition, I have reviewed and discussed with patient certain preventive protocols, quality metrics, and best practice recommendations. A written personalized care plan for preventive services as well as general preventive health recommendations were provided to patient.     Marzella Schlein, LPN   10/13/1094   Nurse Notes: none

## 2023-03-12 NOTE — Patient Instructions (Signed)
Mr. [name] , Thank you for taking time to come for your Medicare Wellness Visit. I appreciate your ongoing commitment to your health goals. Please review the following plan we discussed and let me know if I can assist you in the future.   These are the goals we discussed:  Goals      Patient Stated     Find a magic pill and keep pushing until they do!     Patient Stated     Get rid of walker      Patient Stated     Maintain health         This is a list of the screening recommended for you and due dates:  Health Maintenance  Topic Date Due   Zoster (Shingles) Vaccine (1 of 2) Never done   Flu Shot  05/09/2023   Medicare Annual Wellness Visit  03/11/2024   DTaP/Tdap/Td vaccine (3 - Td or Tdap) 07/13/2029   Pneumonia Vaccine  Completed   COVID-19 Vaccine  Completed   Hepatitis C Screening  Completed   HPV Vaccine  Aged Out   Colon Cancer Screening  Discontinued    Advanced directives: copies in chart   Conditions/risks identified: maintain health   Next appointment: Follow up in one year for your annual wellness visit.   Preventive Care 76 Years and Older, Male  Preventive care refers to lifestyle choices and visits with your health care provider that can promote health and wellness. What does preventive care include? A yearly physical exam. This is also called an annual well check. Dental exams once or twice a year. Routine eye exams. Ask your health care provider how often you should have your eyes checked. Personal lifestyle choices, including: Daily care of your teeth and gums. Regular physical activity. Eating a healthy diet. Avoiding tobacco and drug use. Limiting alcohol use. Practicing safe sex. Taking low doses of aspirin every day. Taking vitamin and mineral supplements as recommended by your health care provider. What happens during an annual well check? The services and screenings done by your health care provider during your annual well check will  depend on your age, overall health, lifestyle risk factors, and family history of disease. Counseling  Your health care provider may ask you questions about your: Alcohol use. Tobacco use. Drug use. Emotional well-being. Home and relationship well-being. Sexual activity. Eating habits. History of falls. Memory and ability to understand (cognition). Work and work Astronomer. Screening  You may have the following tests or measurements: Height, weight, and BMI. Blood pressure. Lipid and cholesterol levels. These may be checked every 5 years, or more frequently if you are over 33 years old. Skin check. Lung cancer screening. You may have this screening every year starting at age 62 if you have a 30-pack-year history of smoking and currently smoke or have quit within the past 15 years. Fecal occult blood test (FOBT) of the stool. You may have this test every year starting at age 30. Flexible sigmoidoscopy or colonoscopy. You may have a sigmoidoscopy every 5 years or a colonoscopy every 10 years starting at age 67. Prostate cancer screening. Recommendations will vary depending on your family history and other risks. Hepatitis C blood test. Hepatitis B blood test. Sexually transmitted disease (STD) testing. Diabetes screening. This is done by checking your blood sugar (glucose) after you have not eaten for a while (fasting). You may have this done every 1-3 years. Abdominal aortic aneurysm (AAA) screening. You may need this if you are  a current or former smoker. Osteoporosis. You may be screened starting at age 76 if you are at high risk. Talk with your health care provider about your test results, treatment options, and if necessary, the need for more tests. Vaccines  Your health care provider may recommend certain vaccines, such as: Influenza vaccine. This is recommended every year. Tetanus, diphtheria, and acellular pertussis (Tdap, Td) vaccine. You may need a Td booster every 10  years. Zoster vaccine. You may need this after age 67. Pneumococcal 13-valent conjugate (PCV13) vaccine. One dose is recommended after age 45. Pneumococcal polysaccharide (PPSV23) vaccine. One dose is recommended after age 70. Talk to your health care provider about which screenings and vaccines you need and how often you need them. This information is not intended to replace advice given to you by your health care provider. Make sure you discuss any questions you have with your health care provider. Document Released: 10/21/2015 Document Revised: 06/13/2016 Document Reviewed: 07/26/2015 Elsevier Interactive Patient Education  2017 [employer].  Fall Prevention in the Home Falls can cause injuries. They can happen to people of all ages. There are many things you can do to make your home safe and to help prevent falls. What can I do on the outside of my home? Regularly fix the edges of walkways and driveways and fix any cracks. Remove anything that might make you trip as you walk through a door, such as a raised step or threshold. Trim any bushes or trees on the path to your home. Use bright outdoor lighting. Clear any walking paths of anything that might make someone trip, such as rocks or tools. Regularly check to see if handrails are loose or broken. Make sure that both sides of any steps have handrails. Any raised decks and porches should have guardrails on the edges. Have any leaves, snow, or ice cleared regularly. Use sand or salt on walking paths during winter. Clean up any spills in your garage right away. This includes oil or grease spills. What can I do in the bathroom? Use night lights. Install grab bars by the toilet and in the tub and shower. Do not use towel bars as grab bars. Use non-skid mats or decals in the tub or shower. If you need to sit down in the shower, use a plastic, non-slip stool. Keep the floor dry. Clean up any water that spills on the floor as soon as it  happens. Remove soap buildup in the tub or shower regularly. Attach bath mats securely with double-sided non-slip rug tape. Do not have throw rugs and other things on the floor that can make you trip. What can I do in the bedroom? Use night lights. Make sure that you have a light by your bed that is easy to reach. Do not use any sheets or blankets that are too big for your bed. They should not hang down onto the floor. Have a firm chair that has side arms. You can use this for support while you get dressed. Do not have throw rugs and other things on the floor that can make you trip. What can I do in the kitchen? Clean up any spills right away. Avoid walking on wet floors. Keep items that you use a lot in easy-to-reach places. If you need to reach something above you, use a strong step stool that has a grab bar. Keep electrical cords out of the way. Do not use floor polish or wax that makes floors slippery. If you must  use wax, use non-skid floor wax. Do not have throw rugs and other things on the floor that can make you trip. What can I do with my stairs? Do not leave any items on the stairs. Make sure that there are handrails on both sides of the stairs and use them. Fix handrails that are broken or loose. Make sure that handrails are as long as the stairways. Check any carpeting to make sure that it is firmly attached to the stairs. Fix any carpet that is loose or worn. Avoid having throw rugs at the top or bottom of the stairs. If you do have throw rugs, attach them to the floor with carpet tape. Make sure that you have a light switch at the top of the stairs and the bottom of the stairs. If you do not have them, ask someone to add them for you. What else can I do to help prevent falls? Wear shoes that: Do not have high heels. Have rubber bottoms. Are comfortable and fit you well. Are closed at the toe. Do not wear sandals. If you use a stepladder: Make sure that it is fully opened.  Do not climb a closed stepladder. Make sure that both sides of the stepladder are locked into place. Ask someone to hold it for you, if possible. Clearly mark and make sure that you can see: Any grab bars or handrails. First and last steps. Where the edge of each step is. Use tools that help you move around (mobility aids) if they are needed. These include: Canes. Walkers. Scooters. Crutches. Turn on the lights when you go into a dark area. Replace any light bulbs as soon as they burn out. Set up your furniture so you have a clear path. Avoid moving your furniture around. If any of your floors are uneven, fix them. If there are any pets around you, be aware of where they are. Review your medicines with your doctor. Some medicines can make you feel dizzy. This can increase your chance of falling. Ask your doctor what other things that you can do to help prevent falls. This information is not intended to replace advice given to you by your health care provider. Make sure you discuss any questions you have with your health care provider. Document Released: 07/21/2009 Document Revised: 03/01/2016 Document Reviewed: 10/29/2014 Elsevier Interactive Patient Education  2017 [employer].

## 2023-03-15 DIAGNOSIS — M48061 Spinal stenosis, lumbar region without neurogenic claudication: Secondary | ICD-10-CM | POA: Diagnosis not present

## 2023-03-15 DIAGNOSIS — M1990 Unspecified osteoarthritis, unspecified site: Secondary | ICD-10-CM | POA: Diagnosis not present

## 2023-03-15 DIAGNOSIS — R1310 Dysphagia, unspecified: Secondary | ICD-10-CM | POA: Diagnosis not present

## 2023-03-15 DIAGNOSIS — E785 Hyperlipidemia, unspecified: Secondary | ICD-10-CM | POA: Diagnosis not present

## 2023-03-15 DIAGNOSIS — G231 Progressive supranuclear ophthalmoplegia [Steele-Richardson-Olszewski]: Secondary | ICD-10-CM | POA: Diagnosis not present

## 2023-03-15 DIAGNOSIS — M50921 Unspecified cervical disc disorder at C4-C5 level: Secondary | ICD-10-CM | POA: Diagnosis not present

## 2023-03-16 DIAGNOSIS — G4700 Insomnia, unspecified: Secondary | ICD-10-CM | POA: Diagnosis not present

## 2023-03-16 DIAGNOSIS — E559 Vitamin D deficiency, unspecified: Secondary | ICD-10-CM | POA: Diagnosis not present

## 2023-03-16 DIAGNOSIS — Z79899 Other long term (current) drug therapy: Secondary | ICD-10-CM | POA: Diagnosis not present

## 2023-03-16 DIAGNOSIS — M50921 Unspecified cervical disc disorder at C4-C5 level: Secondary | ICD-10-CM | POA: Diagnosis not present

## 2023-03-16 DIAGNOSIS — E785 Hyperlipidemia, unspecified: Secondary | ICD-10-CM | POA: Diagnosis not present

## 2023-03-16 DIAGNOSIS — E039 Hypothyroidism, unspecified: Secondary | ICD-10-CM | POA: Diagnosis not present

## 2023-03-16 DIAGNOSIS — K219 Gastro-esophageal reflux disease without esophagitis: Secondary | ICD-10-CM | POA: Diagnosis not present

## 2023-03-16 DIAGNOSIS — G4733 Obstructive sleep apnea (adult) (pediatric): Secondary | ICD-10-CM | POA: Diagnosis not present

## 2023-03-16 DIAGNOSIS — M48061 Spinal stenosis, lumbar region without neurogenic claudication: Secondary | ICD-10-CM | POA: Diagnosis not present

## 2023-03-16 DIAGNOSIS — R1310 Dysphagia, unspecified: Secondary | ICD-10-CM | POA: Diagnosis not present

## 2023-03-16 DIAGNOSIS — G20C Parkinsonism, unspecified: Secondary | ICD-10-CM | POA: Diagnosis not present

## 2023-03-16 DIAGNOSIS — M1990 Unspecified osteoarthritis, unspecified site: Secondary | ICD-10-CM | POA: Diagnosis not present

## 2023-03-16 DIAGNOSIS — J309 Allergic rhinitis, unspecified: Secondary | ICD-10-CM | POA: Diagnosis not present

## 2023-03-16 DIAGNOSIS — E291 Testicular hypofunction: Secondary | ICD-10-CM | POA: Diagnosis not present

## 2023-03-16 DIAGNOSIS — Z85828 Personal history of other malignant neoplasm of skin: Secondary | ICD-10-CM | POA: Diagnosis not present

## 2023-03-16 DIAGNOSIS — G231 Progressive supranuclear ophthalmoplegia [Steele-Richardson-Olszewski]: Secondary | ICD-10-CM | POA: Diagnosis not present

## 2023-03-16 DIAGNOSIS — Z7982 Long term (current) use of aspirin: Secondary | ICD-10-CM | POA: Diagnosis not present

## 2023-03-16 DIAGNOSIS — H409 Unspecified glaucoma: Secondary | ICD-10-CM | POA: Diagnosis not present

## 2023-03-16 DIAGNOSIS — K5900 Constipation, unspecified: Secondary | ICD-10-CM | POA: Diagnosis not present

## 2023-03-16 DIAGNOSIS — Z9181 History of falling: Secondary | ICD-10-CM | POA: Diagnosis not present

## 2023-03-16 DIAGNOSIS — I10 Essential (primary) hypertension: Secondary | ICD-10-CM | POA: Diagnosis not present

## 2023-03-19 DIAGNOSIS — M48061 Spinal stenosis, lumbar region without neurogenic claudication: Secondary | ICD-10-CM | POA: Diagnosis not present

## 2023-03-19 DIAGNOSIS — M1990 Unspecified osteoarthritis, unspecified site: Secondary | ICD-10-CM | POA: Diagnosis not present

## 2023-03-19 DIAGNOSIS — G231 Progressive supranuclear ophthalmoplegia [Steele-Richardson-Olszewski]: Secondary | ICD-10-CM | POA: Diagnosis not present

## 2023-03-19 DIAGNOSIS — M50921 Unspecified cervical disc disorder at C4-C5 level: Secondary | ICD-10-CM | POA: Diagnosis not present

## 2023-03-19 DIAGNOSIS — E785 Hyperlipidemia, unspecified: Secondary | ICD-10-CM | POA: Diagnosis not present

## 2023-03-19 DIAGNOSIS — R1310 Dysphagia, unspecified: Secondary | ICD-10-CM | POA: Diagnosis not present

## 2023-03-27 DIAGNOSIS — E785 Hyperlipidemia, unspecified: Secondary | ICD-10-CM | POA: Diagnosis not present

## 2023-03-27 DIAGNOSIS — G231 Progressive supranuclear ophthalmoplegia [Steele-Richardson-Olszewski]: Secondary | ICD-10-CM | POA: Diagnosis not present

## 2023-03-27 DIAGNOSIS — M1990 Unspecified osteoarthritis, unspecified site: Secondary | ICD-10-CM | POA: Diagnosis not present

## 2023-03-27 DIAGNOSIS — M48061 Spinal stenosis, lumbar region without neurogenic claudication: Secondary | ICD-10-CM | POA: Diagnosis not present

## 2023-03-27 DIAGNOSIS — M50921 Unspecified cervical disc disorder at C4-C5 level: Secondary | ICD-10-CM | POA: Diagnosis not present

## 2023-03-27 DIAGNOSIS — R1310 Dysphagia, unspecified: Secondary | ICD-10-CM | POA: Diagnosis not present

## 2023-04-08 DIAGNOSIS — M48061 Spinal stenosis, lumbar region without neurogenic claudication: Secondary | ICD-10-CM | POA: Diagnosis not present

## 2023-04-08 DIAGNOSIS — G231 Progressive supranuclear ophthalmoplegia [Steele-Richardson-Olszewski]: Secondary | ICD-10-CM | POA: Diagnosis not present

## 2023-04-08 DIAGNOSIS — M1990 Unspecified osteoarthritis, unspecified site: Secondary | ICD-10-CM | POA: Diagnosis not present

## 2023-04-08 DIAGNOSIS — M50921 Unspecified cervical disc disorder at C4-C5 level: Secondary | ICD-10-CM | POA: Diagnosis not present

## 2023-04-08 DIAGNOSIS — E785 Hyperlipidemia, unspecified: Secondary | ICD-10-CM | POA: Diagnosis not present

## 2023-04-08 DIAGNOSIS — R1310 Dysphagia, unspecified: Secondary | ICD-10-CM | POA: Diagnosis not present

## 2023-04-15 ENCOUNTER — Other Ambulatory Visit: Payer: Self-pay | Admitting: Family Medicine

## 2023-05-23 NOTE — Progress Notes (Signed)
Office Visit    Patient Name: [name] Date of Encounter: 05/23/2023  Primary Care Provider:  Willow Ora, MD Primary Cardiologist:  Armanda Magic, MD Primary Electrophysiologist: None   Past Medical History    Past Medical History:  Diagnosis Date   Allergic rhinitis    Allergy    Arthritis    At risk for falls    Cancer [facility])    basal cell skin cancer   Cataract    Complication of anesthesia    Slow to awaken   Constipation    DDD (degenerative disc disease), cervical    Dysphagia    GERD (gastroesophageal reflux disease)    Decdreased, has decreased coffee intack.   Glaucoma    HTN (hypertension)    Hx of adenomatous colonic polyps 01/23/2016   Hyperlipidemia    Hypogonadism male    Hypothyroidism    OSA (obstructive sleep apnea)    Has had surgery x 2   Parkinsonism 01/16/2013   no tremors, balance issues only , speech and swallowing difficulties    Sleep apnea    surgery x 2- no cpap   Staph infection 10-2014   Thyroid disease    HYPOTHYROID   Past Surgical History:  Procedure Laterality Date   CATARACT EXTRACTION     COLONOSCOPY  04-27-2004   tics, hems    I & D EXTREMITY Right 10/18/2014   Procedure: IRRIGATION AND DEBRIDEMENT RIGHT KNEE PREPATELLA BURSA INFECTION;  Surgeon: Nadara Mustard, MD;  Location: [facility] OR;  Service: Orthopedics;  Laterality: Right;   LUMBAR DISC SURGERY  04-2015   LUMBAR LAMINECTOMY WITH COFLEX 1 LEVEL N/A 06/06/2015   Procedure: Lumbar three-four Laminectomy/Foraminotomy with placement of Coflex;  Surgeon: Barnett Abu, MD;  Location: [facility] NEURO ORS;  Service: Neurosurgery;  Laterality: N/A;   NASAL SEPTUM SURGERY     TONSILLECTOMY     and adenoids   UVULOPALATOPHARYNGOPLASTY      Allergies  Allergies  Allergen Reactions   Ambien [Zolpidem Tartrate]     Unknown- balance   Atenolol     unknown   Lunesta [Eszopiclone]     unknown   Oxazepam     unknown   Requip [Ropinirole Hcl]     unknown   Ziac  [Bisoprolol-Hydrochlorothiazide]     unknown     History of Present Illness    [name]  is a 77 year old male with a PMH of asymmetric septal hypertrophy consistent with HOCM, HLD, HTN, OSA, Parkinson's disease, hypothyroidism who presents today for 1 year follow-up.  [name] was seen initially by Dr. Mayford Knife in 2015 for management of hypertension and fatigue with shortness of breath.  He underwent a nuclear stress test that showed no evidence of ischemia but revealed hypertrophic cardiomyopathy and patient was started on beta-blocker therapy. 2D echo 03/25/2017 normal LVEF with moderate LVH and increased thickness of the septum, grade 2 DD but compared to prior study the IVS wall thickness is decreased and there was no evidence of LVOT obstrction or SAM.  Cardiac MRI was pursued due to advanced Parkinson's disease.  He was last seen by Dr. Mayford Knife on 01/23/2021 and most recently seen 05/09/2022 by Wynell Balloon, PA and was doing well with no chest pain dyspnea or edema.  He endorsed some low BP which may be a result of autonomic dysregulation related to Parkinson's.  He was advised to increase water intake at that time and continue compression stockings.  Since last being  seen in the office patient reports that he has been doing well with no new cardiac complaints.  His blood pressure today is controlled at 118/72 and heart rate is 60 bpm.  He is currently not on any antihypertensive and has not experienced any bouts of hypotension since his previous visit.  He has improved his water intake and also uses his compression stockings religiously.  Patient denies chest pain, palpitations, dyspnea, PND, orthopnea, nausea, vomiting, dizziness, syncope, edema, weight gain, or early satiety.  Home Medications    Current Outpatient Medications  Medication Sig Dispense Refill   aspirin 81 MG tablet Take 81 mg by mouth daily.     Azelastine HCl 137 MCG/SPRAY SOLN PLACE 1 SPRAY INTO BOTH NOSTRILS 2 (TWO)  TIMES DAILY 30 mL 3   Cholecalciferol (VITAMIN D) 2000 units CAPS Take 1 capsule by mouth once a week.      fexofenadine (ALLEGRA) 60 MG tablet Take 60 mg by mouth daily.      finasteride (PROSCAR) 5 MG tablet Take 5 mg by mouth daily.  11   fluticasone (FLONASE) 50 MCG/ACT nasal spray SPRAY 1 SPRAY INTO EACH NOSTRIL EVERY DAY 48 mL 2   latanoprost (XALATAN) 0.005 % ophthalmic solution 1 drop at bedtime.     levothyroxine (SYNTHROID) 50 MCG tablet TAKE 1 TABLET BY MOUTH EVERY DAY 90 tablet 3   Multiple Vitamins-Minerals (MULTIVITAMIN WITH MINERALS) tablet Take 1 tablet by mouth daily.     Naproxen Sodium 220 MG CAPS Take by mouth. Once a month prn     polyethylene glycol (MIRALAX / GLYCOLAX) 17 g packet Take 17 g by mouth daily.     rosuvastatin (CRESTOR) 5 MG tablet TAKE 1 TABLET BY MOUTH EVERY DAY 90 tablet 3   timolol (BETIMOL) 0.5 % ophthalmic solution Place 1 drop into both eyes 2 (two) times daily.      No current facility-administered medications for this visit.     Review of Systems  Please see the history of present illness.    All other systems reviewed and are otherwise negative except as noted above.  Physical Exam    Wt Readings from Last 3 Encounters:  03/12/23 180 lb (81.6 kg)  01/22/23 180 lb (81.6 kg)  01/09/23 181 lb 3.2 oz (82.2 kg)   ZO:XWRUE were no vitals filed for this visit.,There is no height or weight on file to calculate BMI.  Constitutional:      Appearance: Healthy appearance. Not in distress.  Neck:     Vascular: JVD normal.  Pulmonary:     Effort: Pulmonary effort is normal.     Breath sounds: No wheezing. No rales. Diminished in the bases Cardiovascular:     Normal rate. Regular rhythm. Normal S1. Normal S2.      Murmurs: There is no murmur.  Edema:    Peripheral edema absent.  Abdominal:     Palpations: Abdomen is soft non tender. There is no hepatomegaly.  Skin:    General: Skin is warm and dry.  Neurological:     General: No focal  deficit present.     Mental Status: Alert and oriented to person, place and time.     Cranial Nerves: Cranial nerves are intact.  EKG/LABS/ Recent Cardiac Studies    ECG personally reviewed by me today -sinus bradycardia with rate of 58 bpm and no acute changes consistent with previous EKG.  Lab Results  Component Value Date   WBC 6.6 01/09/2023   HGB 14.8 01/09/2023  HCT 43.7 01/09/2023   MCV 91.4 01/09/2023   PLT 224.0 01/09/2023   Lab Results  Component Value Date   CREATININE 0.67 01/09/2023   BUN 11 01/09/2023   NA 140 01/09/2023   K 4.3 01/09/2023   CL 104 01/09/2023   CO2 29 01/09/2023   Lab Results  Component Value Date   ALT 19 01/09/2023   AST 19 01/09/2023   ALKPHOS 50 01/09/2023   BILITOT 0.5 01/09/2023   Lab Results  Component Value Date   CHOL 121 01/09/2023   HDL 41.30 01/09/2023   LDLCALC 47 01/09/2023   LDLDIRECT 107.0 02/18/2018   TRIG 163.0 (H) 01/09/2023   CHOLHDL 3 01/09/2023    Lab Results  Component Value Date   HGBA1C 5.0 11/08/2016     Assessment & Plan    1.  Hypertrophic cardiomyopathy: -Discovered on 2D echo and 2019 EF and no outflow obstruction. -Patient deemed not a candidate for cardiac MRI due to advanced Parkinson's disease. -Today patient reports shortness of breath or dizziness.  2.  Hyperlipidemia: -Patient's cholesterol has been stable with triglycerides at 175 and LDL cholesterol at 47 -Continue Crestor 5 mg daily  3.  Essential hypertension: -Patient previously experienced hypotension that has since resolved. -Patient's blood pressure today was controlled at 118/72  4.  Parkinson's disease: -Patient with autonomic dysregulation due to progressive Parkinson's -Currently is asymptomatic with no complaints of dizziness, or hypotension.   Disposition: Follow-up with Armanda Magic, MD or APP in 12 months    Medication Adjustments/Labs and Tests Ordered: Current medicines are reviewed at length with the patient  today.  Concerns regarding medicines are outlined above.   Signed, Napoleon Form, Leodis Rains, NP 05/23/2023, 6:43 PM [facility]

## 2023-05-24 ENCOUNTER — Encounter: Payer: Self-pay | Admitting: Nurse Practitioner

## 2023-05-24 ENCOUNTER — Ambulatory Visit: Payer: Medicare Other | Attending: Nurse Practitioner | Admitting: Nurse Practitioner

## 2023-05-24 VITALS — BP 118/72 | HR 60 | Wt 181.2 lb

## 2023-05-24 DIAGNOSIS — I421 Obstructive hypertrophic cardiomyopathy: Secondary | ICD-10-CM

## 2023-05-24 DIAGNOSIS — G901 Familial dysautonomia [Riley-Day]: Secondary | ICD-10-CM | POA: Diagnosis not present

## 2023-05-24 DIAGNOSIS — E782 Mixed hyperlipidemia: Secondary | ICD-10-CM

## 2023-05-24 DIAGNOSIS — G20B2 Parkinson's disease with dyskinesia, with fluctuations: Secondary | ICD-10-CM | POA: Diagnosis not present

## 2023-05-24 NOTE — Patient Instructions (Signed)
Medication Instructions:  Your physician recommends that you continue on your current medications as directed. Please refer to the Current Medication list given to you today. *If you need a refill on your cardiac medications before your next appointment, please call your pharmacy*   Lab Work: NONE ORDERED   Testing/Procedures: NONE ORDERED   Follow-Up: At [facility], you and your health needs are our priority.  As part of our continuing mission to provide you with exceptional heart care, we have created designated Provider Care Teams.  These Care Teams include your primary Cardiologist (physician) and Advanced Practice Providers (APPs -  Physician Assistants and Nurse Practitioners) who all work together to provide you with the care you need, when you need it.  We recommend signing up for the patient portal called "MyChart".  Sign up information is provided on this After Visit Summary.  MyChart is used to connect with patients for Virtual Visits (Telemedicine).  Patients are able to view lab/test results, encounter notes, upcoming appointments, etc.  Non-urgent messages can be sent to your provider as well.   To learn more about what you can do with MyChart, go to [email or web address].    Your next appointment:   12 month(s)  Provider:   Armanda Magic, MD  ONLY   Other Instructions

## 2023-06-29 DIAGNOSIS — Z23 Encounter for immunization: Secondary | ICD-10-CM | POA: Diagnosis not present

## 2023-07-24 ENCOUNTER — Ambulatory Visit (INDEPENDENT_AMBULATORY_CARE_PROVIDER_SITE_OTHER): Payer: Medicare Other | Admitting: Neurology

## 2023-07-24 ENCOUNTER — Encounter: Payer: Self-pay | Admitting: Neurology

## 2023-07-24 VITALS — BP 130/70 | HR 55 | Ht 70.0 in | Wt 183.0 lb

## 2023-07-24 DIAGNOSIS — Z7409 Other reduced mobility: Secondary | ICD-10-CM | POA: Diagnosis not present

## 2023-07-24 DIAGNOSIS — Z9181 History of falling: Secondary | ICD-10-CM | POA: Diagnosis not present

## 2023-07-24 DIAGNOSIS — G231 Progressive supranuclear ophthalmoplegia [Steele-Richardson-Olszewski]: Secondary | ICD-10-CM

## 2023-07-24 DIAGNOSIS — R1310 Dysphagia, unspecified: Secondary | ICD-10-CM | POA: Diagnosis not present

## 2023-07-24 NOTE — Patient Instructions (Signed)
It was nice to see you both again today.  I am glad to see that you are stable.  Please continue with your current supportive care and therapies.  Follow-up routinely to see Megan in 6 to 8 months.
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
## 2023-07-30 DIAGNOSIS — H401132 Primary open-angle glaucoma, bilateral, moderate stage: Secondary | ICD-10-CM | POA: Diagnosis not present

## 2023-08-13 ENCOUNTER — Other Ambulatory Visit: Payer: Self-pay | Admitting: Family Medicine

## 2023-08-13 DIAGNOSIS — N130 Hydronephrosis with ureteropelvic junction obstruction: Secondary | ICD-10-CM | POA: Diagnosis not present

## 2023-08-13 DIAGNOSIS — N319 Neuromuscular dysfunction of bladder, unspecified: Secondary | ICD-10-CM | POA: Diagnosis not present

## 2023-10-30 ENCOUNTER — Other Ambulatory Visit: Payer: Self-pay | Admitting: Family Medicine

## 2023-10-30 DIAGNOSIS — J302 Other seasonal allergic rhinitis: Secondary | ICD-10-CM

## 2023-11-21 DIAGNOSIS — R8271 Bacteriuria: Secondary | ICD-10-CM | POA: Diagnosis not present

## 2023-11-21 DIAGNOSIS — N3000 Acute cystitis without hematuria: Secondary | ICD-10-CM | POA: Diagnosis not present

## 2023-11-21 DIAGNOSIS — R350 Frequency of micturition: Secondary | ICD-10-CM | POA: Diagnosis not present

## 2024-01-28 DIAGNOSIS — H401132 Primary open-angle glaucoma, bilateral, moderate stage: Secondary | ICD-10-CM | POA: Diagnosis not present

## 2024-01-28 DIAGNOSIS — H02055 Trichiasis without entropian left lower eyelid: Secondary | ICD-10-CM | POA: Diagnosis not present

## 2024-02-04 DIAGNOSIS — I872 Venous insufficiency (chronic) (peripheral): Secondary | ICD-10-CM | POA: Diagnosis not present

## 2024-02-04 DIAGNOSIS — I8312 Varicose veins of left lower extremity with inflammation: Secondary | ICD-10-CM | POA: Diagnosis not present

## 2024-02-04 DIAGNOSIS — L821 Other seborrheic keratosis: Secondary | ICD-10-CM | POA: Diagnosis not present

## 2024-02-04 DIAGNOSIS — I8311 Varicose veins of right lower extremity with inflammation: Secondary | ICD-10-CM | POA: Diagnosis not present

## 2024-02-04 DIAGNOSIS — D485 Neoplasm of uncertain behavior of skin: Secondary | ICD-10-CM | POA: Diagnosis not present

## 2024-02-04 DIAGNOSIS — Z85828 Personal history of other malignant neoplasm of skin: Secondary | ICD-10-CM | POA: Diagnosis not present

## 2024-02-04 DIAGNOSIS — D225 Melanocytic nevi of trunk: Secondary | ICD-10-CM | POA: Diagnosis not present

## 2024-02-05 ENCOUNTER — Other Ambulatory Visit: Payer: Self-pay | Admitting: Family Medicine

## 2024-02-29 ENCOUNTER — Other Ambulatory Visit: Payer: Self-pay | Admitting: Family Medicine

## 2024-03-10 ENCOUNTER — Encounter: Payer: Self-pay | Admitting: Neurology

## 2024-03-10 ENCOUNTER — Ambulatory Visit (INDEPENDENT_AMBULATORY_CARE_PROVIDER_SITE_OTHER): Admitting: Neurology

## 2024-03-10 VITALS — BP 149/83 | HR 61 | Ht 69.0 in | Wt 174.8 lb

## 2024-03-10 DIAGNOSIS — G231 Progressive supranuclear ophthalmoplegia [Steele-Richardson-Olszewski]: Secondary | ICD-10-CM | POA: Diagnosis not present
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
## 2024-03-17 ENCOUNTER — Ambulatory Visit (INDEPENDENT_AMBULATORY_CARE_PROVIDER_SITE_OTHER): Payer: Medicare Other

## 2024-03-17 VITALS — Ht 70.0 in | Wt 174.0 lb

## 2024-03-17 DIAGNOSIS — Z0000 Encounter for general adult medical examination without abnormal findings: Secondary | ICD-10-CM

## 2024-03-17 NOTE — Patient Instructions (Signed)
 [name] , Thank you for taking time out of your busy schedule to complete your Annual Wellness Visit with me. I enjoyed our conversation and look forward to speaking with you again next year. I, as well as your care team,  appreciate your ongoing commitment to your health goals. Please review the following plan we discussed and let me know if I can assist you in the future. Your Game plan/ To Do List    Referrals: If you haven't heard from the office you've been referred to, please reach out to them at the phone provided.   Follow up Visits: Next Medicare AWV with our clinical staff: 03/22/25   Have you seen your provider in the last 6 months (3 months if uncontrolled diabetes)? No Next Office Visit with your provider: 04/13/24  Clinician Recommendations:  Each day, aim for 6 glasses of water , plenty of protein in your diet and try to get up and walk/ stretch every hour for 5-10 minutes at a time.        This is a list of the screening recommended for you and due dates:  Health Maintenance  Topic Date Due   Zoster (Shingles) Vaccine (1 of 2) 02/26/1966   COVID-19 Vaccine (8 - Mixed Product risk 2024-25 season) 12/27/2023   Medicare Annual Wellness Visit  03/11/2024   Flu Shot  05/08/2024   DTaP/Tdap/Td vaccine (3 - Td or Tdap) 07/13/2029   Pneumonia Vaccine  Completed   Hepatitis C Screening  Completed   HPV Vaccine  Aged Out   Meningitis B Vaccine  Aged Out   Colon Cancer Screening  Discontinued    Advanced directives: (In Chart) A copy of your advanced directives are scanned into your chart should your provider ever need it. Advance Care Planning is important because it:  [x]  Makes sure you receive the medical care that is consistent with your values, goals, and preferences  [x]  It provides guidance to your family and loved ones and reduces their decisional burden about whether or not they are making the right decisions based on your wishes.  Follow the link provided in your  after visit summary or read over the paperwork we have mailed to you to help you started getting your Advance Directives in place. If you need assistance in completing these, please reach out to us  so that we can help you!  See attachments for Preventive Care and Fall Prevention Tips.

## 2024-03-17 NOTE — Progress Notes (Signed)
 Subjective:   [name] is a 78 y.o. who presents for a Medicare Wellness preventive visit.  As a reminder, Annual Wellness Visits don't include a physical exam, and some assessments may be limited, especially if this visit is performed virtually. We may recommend an in-person follow-up visit with your provider if needed.  Visit Complete: Virtual I connected with  Myla Artist on 03/17/24 by a audio enabled telemedicine application and verified that I am speaking with the correct person using two identifiers.  Patient Location: Home  Provider Location: Home Office  I discussed the limitations of evaluation and management by telemedicine. The patient expressed understanding and agreed to proceed.  Vital Signs: Because this visit was a virtual/telehealth visit, some criteria may be missing or patient reported. Any vitals not documented were not able to be obtained and vitals that have been documented are patient reported.  VideoDeclined- This patient declined Librarian, academic. Therefore the visit was completed with audio only.  Persons Participating in Visit: Patient.  AWV Questionnaire: No: Patient Medicare AWV questionnaire was not completed prior to this visit.  Cardiac Risk Factors include: advanced age (>31men, >63 women);dyslipidemia;male gender;hypertension     Objective:     Today's Vitals   03/17/24 1317  Weight: 174 lb (78.9 kg)  Height: 5\' 10"  (1.778 m)   Body mass index is 24.97 kg/m.     03/17/2024    1:22 PM 03/12/2023    2:20 PM 02/19/2022    1:48 PM 02/13/2021    2:02 PM 02/15/2020   12:36 PM 01/11/2020    3:29 PM 01/15/2018    1:30 PM  Advanced Directives  Does Patient Have a Medical Advance Directive? Yes Yes Yes Yes Yes Yes Yes  Type of Estate agent of [employer];Living will Healthcare Power of [place];Living will Healthcare Power of [employer] Power of [employer] Power of  [place];Living will Living will;Healthcare Power of Attorney   Does patient want to make changes to medical advance directive? No - Patient declined No - Patient declined   No - Patient declined No - Patient declined   Copy of Healthcare Power of Attorney in Chart? Yes - validated most recent copy scanned in chart (See row information) Yes - validated most recent copy scanned in chart (See row information) Yes - validated most recent copy scanned in chart (See row information) No - copy requested  No - copy requested     Current Medications (verified) Outpatient Encounter Medications as of 03/17/2024  Medication Sig   aspirin  81 MG tablet Take 81 mg by mouth daily.   Azelastine  HCl 137 MCG/SPRAY SOLN PLACE 1 SPRAY INTO BOTH NOSTRILS 2 (TWO) TIMES DAILY   Cholecalciferol (VITAMIN D ) 2000 units CAPS Take 1 capsule by mouth once a week.    fexofenadine (ALLEGRA) 60 MG tablet Take 60 mg by mouth daily.    finasteride (PROSCAR) 5 MG tablet Take 5 mg by mouth daily.   fluticasone  (FLONASE ) 50 MCG/ACT nasal spray SPRAY 1 SPRAY INTO EACH NOSTRIL EVERY DAY   latanoprost (XALATAN) 0.005 % ophthalmic solution 1 drop at bedtime.   levothyroxine  (SYNTHROID ) 50 MCG tablet TAKE 1 TABLET BY MOUTH EVERY DAY   Multiple Vitamins-Minerals (MULTIVITAMIN WITH MINERALS) tablet Take 1 tablet by mouth daily.   Naproxen Sodium 220 MG CAPS Take by mouth. Once a month prn   polyethylene glycol (MIRALAX  / GLYCOLAX ) 17 g packet Take 17 g by mouth daily.   rosuvastatin  (CRESTOR ) 5 MG tablet  TAKE 1 TABLET BY MOUTH EVERY DAY   timolol  (BETIMOL ) 0.5 % ophthalmic solution Place 1 drop into both eyes 2 (two) times daily.    No facility-administered encounter medications on file as of 03/17/2024.    Allergies (verified) Ambien [zolpidem tartrate], Atenolol, Lunesta [eszopiclone], Oxazepam, Requip [ropinirole hcl], and Ziac [bisoprolol-hydrochlorothiazide]   History: Past Medical History:  Diagnosis Date   Allergic rhinitis     Allergy     Arthritis    At risk for falls    Cancer (HCC)    basal cell skin cancer   Cataract    Complication of anesthesia    Slow to awaken   Constipation    DDD (degenerative disc disease), cervical    Dysphagia    GERD (gastroesophageal reflux disease)    Decdreased, has decreased coffee intack.   Glaucoma    HTN (hypertension)    Hx of adenomatous colonic polyps 01/23/2016   Hyperlipidemia    Hypogonadism male    Hypothyroidism    OSA (obstructive sleep apnea)    Has had surgery x 2   Parkinsonism (HCC) 01/16/2013   no tremors, balance issues only , speech and swallowing difficulties    Sleep apnea    surgery x 2- no cpap   Staph infection 10-2014   Thyroid  disease    HYPOTHYROID   Past Surgical History:  Procedure Laterality Date   CATARACT EXTRACTION     COLONOSCOPY  04-27-2004   tics, hems    I & D EXTREMITY Right 10/18/2014   Procedure: IRRIGATION AND DEBRIDEMENT RIGHT KNEE PREPATELLA BURSA INFECTION;  Surgeon: Timothy Ford, MD;  Location: [facility] OR;  Service: Orthopedics;  Laterality: Right;   LUMBAR DISC SURGERY  04-2015   LUMBAR LAMINECTOMY WITH COFLEX 1 LEVEL N/A 06/06/2015   Procedure: Lumbar three-four Laminectomy/Foraminotomy with placement of Coflex;  Surgeon: Elna Haggis, MD;  Location: [facility];  Service: Neurosurgery;  Laterality: N/A;   NASAL SEPTUM SURGERY     TONSILLECTOMY     and adenoids   UVULOPALATOPHARYNGOPLASTY     Family History  Problem Relation Age of Onset   Emphysema Mother    COPD Mother    Heart disease Mother    Hypertension Mother    Liver cancer Father    Cancer Father    Diabetes Father    Hypertension Father    Colon polyps Father    Heart disease Sister    COPD Sister    Heart attack Sister    Hypertension Sister    Colon polyps Brother    Stroke Neg Hx    Colon cancer Neg Hx    Parkinson's disease Neg Hx    Social History   Socioeconomic History   Marital status: Married    Spouse name: linda   Number of  children: 2   Years of education: college   Highest education level: Not on file  Occupational History   Occupation: branch Camera operator    Employer: RETIRED  Tobacco Use   Smoking status: Never   Smokeless tobacco: Never  Vaping Use   Vaping status: Never Used  Substance and Sexual Activity   Alcohol use: No    Alcohol/week: 0.0 standard drinks of alcohol   Drug use: No   Sexual activity: Not on file  Other Topics Concern   Not on file  Social History Narrative   3 Grandchildren    Social Drivers of Health   Financial Resource Strain: Low Risk  (03/17/2024)  Overall Financial Resource Strain (CARDIA)    Difficulty of Paying Living Expenses: Not hard at all  Food Insecurity: No Food Insecurity (03/17/2024)   Hunger Vital Sign    Worried About Running Out of Food in the Last Year: Never true    Ran Out of Food in the Last Year: Never true  Transportation Needs: No Transportation Needs (03/17/2024)   PRAPARE - Administrator, Civil Service (Medical): No    Lack of Transportation (Non-Medical): No  Physical Activity: Sufficiently Active (03/17/2024)   Exercise Vital Sign    Days of Exercise per Week: 4 days    Minutes of Exercise per Session: 60 min  Stress: No Stress Concern Present (03/17/2024)   [employer] of Occupational Health - Occupational Stress Questionnaire    Feeling of Stress : Not at all  Social Connections: Moderately Integrated (03/17/2024)   Social Connection and Isolation Panel [NHANES]    Frequency of Communication with Friends and Family: Once a week    Frequency of Social Gatherings with Friends and Family: More than three times a week    Attends Religious Services: 1 to 4 times per year    Active Member of [employer] or Organizations: No    Attends Banker Meetings: Never    Marital Status: Married    Tobacco Counseling Counseling given: Not Answered    Clinical Intake:  Pre-visit preparation completed: Yes  Pain  : No/denies pain     BMI - recorded: 24.97 Nutritional Status: BMI of 19-24  Normal Nutritional Risks: None Diabetes: No  Lab Results  Component Value Date   HGBA1C 5.0 11/08/2016   HGBA1C 5.2 09/30/2015   HGBA1C 5.6 08/31/2013     How often do you need to have someone help you when you read instructions, pamphlets, or other written materials from your doctor or pharmacy?: 1 - Never  Interpreter Needed?: No  Information entered by :: Lamont Pilsner, LPN   Activities of Daily Living     03/17/2024    1:19 PM  In your present state of health, do you have any difficulty performing the following activities:  Hearing? 0  Vision? 0  Difficulty concentrating or making decisions? 0  Walking or climbing stairs? 1  Comment use a walker  Dressing or bathing? 1  Doing errands, shopping? 0  Preparing Food and eating ? Y  Comment wife assist  Using the Toilet? Y  In the past six months, have you accidently leaked urine? Y  Comment wears  a brief  Do you have problems with loss of bowel control? Y  Managing your Medications? Y  Comment wifge assist  Managing your Finances? Y  Comment wife assist  Housekeeping or managing your Housekeeping? Y    Patient Care Team: Luevenia Saha, MD as PCP - General (Family Medicine) Jacqueline Matsu, MD as PCP - Cardiology (Cardiology) Erman Hayward, MD as Consulting Physician (Urology) Oris Birmingham, MD as Consulting Physician (Ophthalmology) Debbra Fairy, MD as Attending Physician (Neurology)  I have updated your Care Teams any recent Medical Services you may have received from other providers in the past year.     Assessment:    This is a routine wellness examination for Ezrah.  Hearing/Vision screen Hearing Screening - Comments:: Pt denies any hearing issues  Vision Screening - Comments:: Wears rx glasses - up to date with routine eye exams with Dr Cindra Cree [facility]     Goals Addressed  This Visit's Progress    Patient Stated       Maintain health and activity        Depression Screen     03/17/2024    1:23 PM 03/12/2023    2:18 PM 01/09/2023    1:11 PM 02/19/2022    1:47 PM 02/13/2021    2:00 PM 01/11/2020    3:31 PM 04/22/2019    1:05 PM  PHQ 2/9 Scores  PHQ - 2 Score 0 0 0 0 0 0 0  PHQ- 9 Score       0    Fall Risk     03/17/2024    1:28 PM 03/12/2023    2:20 PM 01/09/2023    1:07 PM 02/19/2022    1:49 PM 12/27/2021   12:59 PM  Fall Risk   Falls in the past year? 0 1 1 1 1   Number falls in past yr: 0 1 0 1 1  Injury with Fall? 0 0 0 1 1  Comment    scratches   Risk for fall due to : Impaired mobility;Impaired balance/gait;History of fall(s) Impaired balance/gait;Impaired mobility;Impaired vision;History of fall(s) History of fall(s) Impaired balance/gait;Impaired mobility;Impaired vision;History of fall(s) Impaired balance/gait  Follow up Falls prevention discussed Falls prevention discussed Falls evaluation completed Falls prevention discussed Falls prevention discussed;Falls evaluation completed    MEDICARE RISK AT HOME:  Medicare Risk at Home Any stairs in or around the home?: Yes If so, are there any without handrails?: No Home free of loose throw rugs in walkways, pet beds, electrical cords, etc?: Yes Adequate lighting in your home to reduce risk of falls?: Yes Life alert?: No Use of a cane, walker or w/c?: Yes Grab bars in the bathroom?: Yes Shower chair or bench in shower?: Yes Elevated toilet seat or a handicapped toilet?: No  TIMED UP AND GO:  Was the test performed?  No  Cognitive Function: 6CIT completed    01/15/2018    1:41 PM 11/19/2016   10:43 AM 04/30/2016    1:14 PM 10/31/2015    1:10 PM 11/18/2014    2:25 PM  MMSE - Mini Mental State Exam  Not completed: --      Orientation to time  5 5 5 5   Orientation to Place  5 5 5 5   Registration  3 3 3 3   Attention/ Calculation  5 3 5 5   Recall  3 3 3 3   Language- name 2 objects  2 2 2 2    Language- repeat  1 1 1 1   Language- follow 3 step command  3 3 3 3   Language- read & follow direction  1 1 1 1   Write a sentence  1 1 1 1   Copy design  1 1 1 1   Total score  30 28 30 30         03/17/2024    1:29 PM 03/12/2023    2:24 PM 02/19/2022    1:54 PM 02/13/2021    2:06 PM  6CIT Screen  What Year? 0 points 0 points 0 points 0 points  What month? 0 points 0 points 0 points 0 points  What time? 0 points 0 points 0 points   Count back from 20 0 points 0 points 0 points 0 points  Months in reverse 0 points 0 points 4 points 0 points  Repeat phrase 0 points 0 points 0 points 0 points  Total Score 0 points 0 points 4 points     Immunizations  Immunization History  Administered Date(s) Administered   Fluad Quad(high Dose 65+) 07/08/2019, 07/12/2020   Influenza Whole 08/29/2010, 07/09/2011, 07/21/2012   Influenza, High Dose Seasonal PF 07/24/2013, 07/15/2015, 06/23/2016, 07/04/2017, 07/07/2022, 06/29/2023   Influenza-Unspecified 07/23/2014, 07/04/2017, 07/08/2018   Moderna Sars-Covid-2 Vaccination 11/23/2019, 12/22/2019   PFIZER(Purple Top)SARS-COV-2 Vaccination 08/03/2020, 02/25/2021   Pfizer Covid-19 Vaccine Bivalent Booster 17yrs & up 07/14/2021   Pfizer(Comirnaty)Fall Seasonal Vaccine 12 years and older 08/10/2022, 06/29/2023   Pneumococcal Conjugate-13 05/13/2014   Pneumococcal Polysaccharide-23 10/08/2002, 08/12/2015   Respiratory Syncytial Virus Vaccine,Recomb Aduvanted(Arexvy) 08/17/2022   Tdap 07/08/2010, 07/14/2019   Zoster, Live 07/09/2007    Screening Tests Health Maintenance  Topic Date Due   Zoster Vaccines- Shingrix  (1 of 2) [birth year]26/1965   COVID-19 Vaccine (8 - Mixed Product risk 2024-25 season) 12/27/2023   INFLUENZA VACCINE  05/08/2024   Medicare Annual Wellness (AWV)  03/17/2025   DTaP/Tdap/Td (3 - Td or Tdap) 07/13/2029   Pneumonia Vaccine 31+ Years old  Completed   Hepatitis C Screening  Completed   HPV VACCINES  Aged Out   Meningococcal B Vaccine   Aged Out   Colonoscopy  Discontinued    Health Maintenance  Health Maintenance Due  Topic Date Due   Zoster Vaccines- Shingrix  (1 of 2) 02[birth year]26/1965   COVID-19 Vaccine (8 - Mixed Product risk 2024-25 season) 12/27/2023   Health Maintenance Items Addressed: See Nurse Notes at the end of this note  Additional Screening:  Vision Screening: Recommended annual ophthalmology exams for early detection of glaucoma and other disorders of the eye. Would you like a referral to an eye doctor? No    Dental Screening: Recommended annual dental exams for proper oral hygiene  Community Resource Referral / Chronic Care Management: CRR required this visit?  No   CCM required this visit?  No   Plan:    I have personally reviewed and noted the following in the patient's chart:   Medical and social history Use of alcohol, tobacco or illicit drugs  Current medications and supplements including opioid prescriptions. Patient is not currently taking opioid prescriptions. Functional ability and status Nutritional status Physical activity Advanced directives List of other physicians Hospitalizations, surgeries, and ER visits in previous 12 months Vitals Screenings to include cognitive, depression, and falls Referrals and appointments  In addition, I have reviewed and discussed with patient certain preventive protocols, quality metrics, and best practice recommendations. A written personalized care plan for preventive services as well as general preventive health recommendations were provided to patient.   Bruno Capri, LPN   1/61/0960   After Visit Summary: (MyChart) Due to this being a telephonic visit, the after visit summary with patients personalized plan was offered to patient via MyChart   Notes: Nothing significant to report at this time.

## 2024-03-18 ENCOUNTER — Ambulatory Visit: Payer: Medicare Other | Admitting: Adult Health

## 2024-04-08 ENCOUNTER — Ambulatory Visit (INDEPENDENT_AMBULATORY_CARE_PROVIDER_SITE_OTHER): Admitting: Family Medicine

## 2024-04-08 ENCOUNTER — Encounter: Payer: Self-pay | Admitting: Family Medicine

## 2024-04-08 VITALS — BP 154/75 | HR 51 | Temp 98.1°F | Ht 70.0 in | Wt 177.8 lb

## 2024-04-08 DIAGNOSIS — E039 Hypothyroidism, unspecified: Secondary | ICD-10-CM

## 2024-04-08 DIAGNOSIS — R8290 Unspecified abnormal findings in urine: Secondary | ICD-10-CM | POA: Diagnosis not present

## 2024-04-08 DIAGNOSIS — Z8679 Personal history of other diseases of the circulatory system: Secondary | ICD-10-CM | POA: Diagnosis not present

## 2024-04-08 DIAGNOSIS — G231 Progressive supranuclear ophthalmoplegia [Steele-Richardson-Olszewski]: Secondary | ICD-10-CM | POA: Diagnosis not present

## 2024-04-08 DIAGNOSIS — I421 Obstructive hypertrophic cardiomyopathy: Secondary | ICD-10-CM | POA: Diagnosis not present

## 2024-04-08 DIAGNOSIS — M48061 Spinal stenosis, lumbar region without neurogenic claudication: Secondary | ICD-10-CM | POA: Diagnosis not present

## 2024-04-08 DIAGNOSIS — G901 Familial dysautonomia [Riley-Day]: Secondary | ICD-10-CM

## 2024-04-08 DIAGNOSIS — E782 Mixed hyperlipidemia: Secondary | ICD-10-CM | POA: Diagnosis not present

## 2024-04-08 DIAGNOSIS — G20B2 Parkinson's disease with dyskinesia, with fluctuations: Secondary | ICD-10-CM

## 2024-04-08 DIAGNOSIS — G609 Hereditary and idiopathic neuropathy, unspecified: Secondary | ICD-10-CM | POA: Diagnosis not present

## 2024-04-08 LAB — COMPREHENSIVE METABOLIC PANEL WITH GFR
ALT: 22 U/L (ref 0–53)
AST: 20 U/L (ref 0–37)
Albumin: 4.4 g/dL (ref 3.5–5.2)
Alkaline Phosphatase: 60 U/L (ref 39–117)
BUN: 15 mg/dL (ref 6–23)
CO2: 27 meq/L (ref 19–32)
Calcium: 9.6 mg/dL (ref 8.4–10.5)
Chloride: 102 meq/L (ref 96–112)
Creatinine, Ser: 0.64 mg/dL (ref 0.40–1.50)
GFR: 91.57 mL/min (ref >60.00)
Glucose, Bld: 89 mg/dL (ref 70–99)
Potassium: 4.1 meq/L (ref 3.5–5.1)
Sodium: 139 meq/L (ref 135–145)
Total Bilirubin: 0.5 mg/dL (ref 0.2–1.2)
Total Protein: 7.7 g/dL (ref 6.0–8.3)

## 2024-04-08 LAB — CBC WITH DIFFERENTIAL/PLATELET
Basophils Absolute: 0 10*3/uL (ref 0.0–0.1)
Basophils Relative: 0.6 % (ref 0.0–3.0)
Eosinophils Absolute: 0.7 10*3/uL (ref 0.0–0.7)
Eosinophils Relative: 9.1 % — ABNORMAL HIGH (ref 0.0–5.0)
HCT: 44.6 % (ref 39.0–52.0)
Hemoglobin: 15 g/dL (ref 13.0–17.0)
Lymphocytes Relative: 23.7 % (ref 12.0–46.0)
Lymphs Abs: 1.8 10*3/uL (ref 0.7–4.0)
MCHC: 33.7 g/dL (ref 30.0–36.0)
MCV: 91.2 fl (ref 78.0–100.0)
Monocytes Absolute: 0.6 10*3/uL (ref 0.1–1.0)
Monocytes Relative: 8.2 % (ref 3.0–12.0)
Neutro Abs: 4.3 10*3/uL (ref 1.4–7.7)
Neutrophils Relative %: 58.4 % (ref 43.0–77.0)
Platelets: 253 10*3/uL (ref 150.0–400.0)
RBC: 4.89 Mil/uL (ref 4.22–5.81)
RDW: 13 % (ref 11.5–15.5)
WBC: 7.4 10*3/uL (ref 4.0–10.5)

## 2024-04-08 LAB — LIPID PANEL
Cholesterol: 111 mg/dL (ref 0–200)
HDL: 39.6 mg/dL (ref >39.00)
LDL Cholesterol: 37 mg/dL (ref 0–99)
NonHDL: 71.6
Total CHOL/HDL Ratio: 3
Triglycerides: 175 mg/dL — ABNORMAL HIGH (ref 0.0–149.0)
VLDL: 35 mg/dL (ref 0.0–40.0)

## 2024-04-08 LAB — TSH: TSH: 2.64 u[IU]/mL (ref 0.35–5.50)

## 2024-04-08 MED ORDER — ROSUVASTATIN CALCIUM 5 MG PO TABS: 5.0000 mg | ORAL_TABLET | Freq: Every day | ORAL | 3 refills | Status: AC

## 2024-04-09 ENCOUNTER — Telehealth: Payer: Self-pay | Admitting: *Deleted

## 2024-04-09 LAB — URINE CULTURE
MICRO NUMBER:: 16652168
SPECIMEN QUALITY:: ADEQUATE

## 2024-04-09 MED ORDER — NITROFURANTOIN MONOHYD MACRO 100 MG PO CAPS: 100.0000 mg | ORAL_CAPSULE | Freq: Two times a day (BID) | ORAL | 0 refills | Status: DC

## 2024-04-09 NOTE — Addendum Note (Signed)
 Addended by: JODIE GAMMONS on: 04/09/2024 07:47 PM   Modules accepted: Orders

## 2024-04-09 NOTE — Progress Notes (Signed)
 Subjective  Chief Complaint  Patient presents with   Annual Exam    Pt here for Annual Exam and is currently fasting    Hypertension   Hyperlipidemia   Hypothyroidism   Urinary Frequency    Pt has to keep going back and forth to the restroom and only a little bit comes out an dit is very cloudy    HPI: [name] is a 78 y.o. male who presents to [facility] [place] today for a Male Wellness Visit. He also has the concerns and/or needs as listed above in the chief complaint. These will be addressed in addition to the Health Maintenance Visit.   Wellness Visit: annual visit with health maintenance review and exam   HM: here with wife and daughter (caregivers). Most history is from them. Report [name] is stable. I reviewed recent neuro notes.    Body mass index is 25.51 kg/m. Wt Readings from Last 3 Encounters:  04/08/24 177 lb 12.8 oz (80.6 kg)  03/17/24 174 lb (78.9 kg)  03/10/24 174 lb 12.8 oz (79.3 kg)   Chronic disease management visit and/or acute problem visit: Parkinsons and PSP: fortunately stable. No recent falls. Per neuro Low thyroid  due for recheck.  Has urinary frequency with cloudy urine x 2-3 days. No altered mental status from baseline nor fevers.   Assessment  1. Acquired hypothyroidism   2. Parkinson's disease with dyskinesia and fluctuating manifestations (HCC)   3. Mixed hyperlipidemia   4. HOCM (hypertrophic obstructive cardiomyopathy) (HCC)   5. PSP (progressive supranuclear palsy) (HCC)   6. Idiopathic peripheral neuropathy   7. History of hypertension   8. Spinal stenosis of lumbar region, unspecified whether neurogenic claudication present   9. Dysautonomia (HCC) Chronic  10. Cloudy urine      Plan  Male Wellness Visit: Age appropriate Health Maintenance and Prevention measures were discussed with patient. Included topics are cancer screening recommendations, ways to keep healthy (see AVS) including dietary and exercise  recommendations, regular eye and dental care, use of seat belts, and avoidance of moderate alcohol use and tobacco use.  BMI: discussed patient's BMI and encouraged positive lifestyle modifications to help get to or maintain a target BMI. HM needs and immunizations were addressed and ordered. See below for orders. See HM and immunization section for updates. Routine labs and screening tests ordered including cmp, cbc and lipids where appropriate. Discussed recommendations regarding Vit D and calcium  supplementation (see AVS)  Chronic disease f/u and/or acute problem visit: (deemed necessary to be done in addition to the wellness visit): Chronic problems are stable. Reviewed meds. Aspiration precautions, fall precautions reviewed and in place. All food is pureed.  Monitor bp Check labs/thyroid  test.  Start empiric abx and f/u on urine studies. Follow up: 12 mo Commons side effects, risks, benefits, and alternatives for medications and treatment plan prescribed today were discussed, and the patient expressed understanding of the given instructions. Patient is instructed to call or message via MyChart if he/she has any questions or concerns regarding our treatment plan. No barriers to understanding were identified. We discussed Red Flag symptoms and signs in detail. Patient expressed understanding regarding what to do in case of urgent or emergency type symptoms.  Medication list was reconciled, printed and provided to the patient in AVS. Patient instructions and summary information was reviewed with the patient as documented in the AVS. This note was prepared with assistance of Dragon voice recognition software. Occasional wrong-word or sound-a-like substitutions may have  occurred due to the inherent limitations of voice recognition software  Orders Placed This Encounter  Procedures   Urine Culture   CBC with Differential/Platelet   Comprehensive metabolic panel with GFR   Lipid panel   TSH    Urinalysis, Routine w reflex microscopic   Meds ordered this encounter  Medications   rosuvastatin  (CRESTOR ) 5 MG tablet    Sig: Take 1 tablet (5 mg total) by mouth daily.    Dispense:  90 tablet    Refill:  3     Patient Active Problem List   Diagnosis Date Noted   Hypothyroidism 02/18/2018   Peripheral neuropathy 11/14/2016   PSP (progressive supranuclear palsy) (HCC) 11/08/2016   Lumbar stenosis 06/06/2015   HOCM (hypertrophic obstructive cardiomyopathy) (HCC) 12/12/2014   Mixed hyperlipidemia 08/30/2013   Parkinsonism (HCC) 01/16/2013   History of hypertension 06/01/2008   Tubular adenoma of colon 01/23/2016   GLAUCOMA 06/01/2008   Testosterone  deficiency 11/12/2013   Impotence of organic origin 08/30/2013   Seasonal and perennial allergic rhinitis 06/01/2008   Benign prostatic hyperplasia with lower urinary tract symptoms 09/23/2020   Dysautonomia [facility])    Health Maintenance  Topic Date Due   Zoster Vaccines- Shingrix  (1 of 2) 02/26/1966   COVID-19 Vaccine (8 - Mixed Product risk 2024-25 season) 04/24/2024 (Originally 12/27/2023)   INFLUENZA VACCINE  05/08/2024   Medicare Annual Wellness (AWV)  03/17/2025   DTaP/Tdap/Td (3 - Td or Tdap) 07/13/2029   Pneumococcal Vaccine: 50+ Years  Completed   Hepatitis C Screening  Completed   Hepatitis B Vaccines  Aged Out   HPV VACCINES  Aged Out   Meningococcal B Vaccine  Aged Out   Colonoscopy  Discontinued   Immunization History  Administered Date(s) Administered   Fluad Quad(high Dose 65+) 07/08/2019, 07/12/2020   Influenza Whole 08/29/2010, 07/09/2011, 07/21/2012   Influenza, High Dose Seasonal PF 07/24/2013, 07/15/2015, 06/23/2016, 07/04/2017, 07/07/2022, 06/29/2023   Influenza-Unspecified 07/23/2014, 07/04/2017, 07/08/2018   Moderna Sars-Covid-2 Vaccination 11/23/2019, 12/22/2019   PFIZER(Purple Top)SARS-COV-2 Vaccination 08/03/2020, 02/25/2021   Pfizer Covid-19 Vaccine Bivalent Booster 58yrs & up 07/14/2021    Pfizer(Comirnaty)Fall Seasonal Vaccine 12 years and older 08/10/2022, 06/29/2023   Pneumococcal Conjugate-13 05/13/2014   Pneumococcal Polysaccharide-23 10/08/2002, 08/12/2015   Respiratory Syncytial Virus Vaccine,Recomb Aduvanted(Arexvy) 08/17/2022   Tdap 07/08/2010, 07/14/2019   Zoster, Live 07/09/2007   We updated and reviewed the patient's past history in detail and it is documented below. Allergies: Patient is allergic to ambien [zolpidem tartrate], atenolol, lunesta [eszopiclone], oxazepam, requip [ropinirole hcl], and ziac [bisoprolol-hydrochlorothiazide]. Past Medical History  has a past medical history of Allergic rhinitis, Allergy , Arthritis, At risk for falls, Cancer (HCC), Cataract, Complication of anesthesia, Constipation, DDD (degenerative disc disease), cervical, Dysphagia, GERD (gastroesophageal reflux disease), Glaucoma, HTN (hypertension), adenomatous colonic polyps (01/23/2016), Hyperlipidemia, Hypogonadism male, Hypothyroidism, OSA (obstructive sleep apnea), Parkinsonism (HCC) (01/16/2013), Sleep apnea, Staph infection (10-2014), and Thyroid  disease. Past Surgical History Patient  has a past surgical history that includes Uvulopalatopharyngoplasty; Lumbar disc surgery (04-2015); Cataract extraction; I & D extremity (Right, 10/18/2014); Tonsillectomy; Nasal septum surgery; Lumbar laminectomy with coflex 1 level (N/A, 06/06/2015); and Colonoscopy (04-27-2004). Social History Patient  reports that he has never smoked. He has never used smokeless tobacco. He reports that he does not drink alcohol and does not use drugs. Family History family history includes COPD in his mother and sister; Cancer in his father; Colon polyps in his brother and father; Diabetes in his father; Emphysema in his mother; Heart attack in his sister;  Heart disease in his mother and sister; Hypertension in his father, mother, and sister; Liver cancer in his father. Review of Systems: Constitutional: negative for  fever or malaise Ophthalmic: negative for photophobia, double vision or loss of vision Cardiovascular: negative for chest pain, dyspnea on exertion, or new LE swelling Respiratory: negative for SOB or persistent cough Gastrointestinal: negative for abdominal pain, change in bowel habits or melena Genitourinary: negative for dysuria or gross hematuria Musculoskeletal: negative for new gait disturbance or muscular weakness Integumentary: negative for new or persistent rashes Neurological: negative for TIA or stroke symptoms Psychiatric: negative for SI or delusions Allergic/Immunologic: negative for hives  Patient Care Team    Relationship Specialty Notifications Start End  Jodie Lavern CROME, MD PCP - General Family Medicine  09/25/19   Shlomo Wilbert SAUNDERS, MD PCP - Cardiology Cardiology Admissions 05/06/18   Gaston Hamilton, MD Consulting Physician Urology  09/25/19   Rosan Credit, MD Consulting Physician Ophthalmology  09/25/19   Buck Saucer, MD Attending Physician Neurology  09/25/19    Objective  Vitals: BP (!) 154/75   Pulse (!) 51   Temp 98.1 F (36.7 C)   Ht 5' 10 (1.778 m)   Wt 177 lb 12.8 oz (80.6 kg)   SpO2 96%   BMI 25.51 kg/m  General:  well nourished, no acute distress , responds to some questions HEENT:  Normocephalic, atraumatic, non-icteric sclera,  oropharynx is clear without mass or exudate, supple neck without adenopathy, or thyromegaly Cardiovascular:  Normal S1, S2, RRR without gallop, rub or murmur,  Respiratory:  Good breath sounds bilaterally, CTAB with normal respiratory effort Gastrointestinal: normal bowel sounds, soft, non-tender, no noted masses. No HSM MSK: Joints are without erythema or swelling.  Skin:  Warm, no rashes

## 2024-04-09 NOTE — Telephone Encounter (Signed)
 Copied from CRM [id number]. Topic: Clinical - Medication Question >> Apr 09, 2024  9:51 AM Burnard DEL wrote: Reason for CRM: Patients was advised by provider at visit yesterday that he would be prescribed an antibiotic.Patients wife called pharmacy and they do not have prescription.Patients wife called to check on status of this antibiotic being sent to pharmacy.  [id number] - [place], [place] - [address]  Phone: [phone] Fax: [phone]

## 2024-04-10 ENCOUNTER — Ambulatory Visit: Payer: Self-pay | Admitting: Family Medicine

## 2024-04-10 NOTE — Progress Notes (Signed)
 See mychart note Dear Mrs. Moishe, [name]'s lab results look overall. His urine culture does not show infection. Please ensure he is hydrated and let me know if his urinary symptoms persist. His other numbers are all stable. Sincerely, Dr. Jodie

## 2024-04-13 ENCOUNTER — Ambulatory Visit: Admitting: Family Medicine

## 2024-04-28 DIAGNOSIS — H04123 Dry eye syndrome of bilateral lacrimal glands: Secondary | ICD-10-CM | POA: Diagnosis not present

## 2024-04-28 DIAGNOSIS — H401132 Primary open-angle glaucoma, bilateral, moderate stage: Secondary | ICD-10-CM | POA: Diagnosis not present

## 2024-05-24 ENCOUNTER — Other Ambulatory Visit: Payer: Self-pay | Admitting: Family Medicine

## 2024-05-24 DIAGNOSIS — J302 Other seasonal allergic rhinitis: Secondary | ICD-10-CM

## 2024-06-09 DIAGNOSIS — N319 Neuromuscular dysfunction of bladder, unspecified: Secondary | ICD-10-CM | POA: Diagnosis not present

## 2024-06-09 DIAGNOSIS — R3914 Feeling of incomplete bladder emptying: Secondary | ICD-10-CM | POA: Diagnosis not present

## 2024-06-09 DIAGNOSIS — R8279 Other abnormal findings on microbiological examination of urine: Secondary | ICD-10-CM | POA: Diagnosis not present

## 2024-06-09 DIAGNOSIS — R350 Frequency of micturition: Secondary | ICD-10-CM | POA: Diagnosis not present

## 2024-06-09 DIAGNOSIS — N3000 Acute cystitis without hematuria: Secondary | ICD-10-CM | POA: Diagnosis not present

## 2024-07-05 DIAGNOSIS — Z23 Encounter for immunization: Secondary | ICD-10-CM | POA: Diagnosis not present

## 2024-07-20 DIAGNOSIS — N139 Obstructive and reflux uropathy, unspecified: Secondary | ICD-10-CM | POA: Diagnosis not present

## 2024-07-20 DIAGNOSIS — N390 Urinary tract infection, site not specified: Secondary | ICD-10-CM | POA: Diagnosis not present

## 2024-07-30 ENCOUNTER — Other Ambulatory Visit: Payer: Self-pay | Admitting: Family Medicine

## 2024-08-12 DIAGNOSIS — R351 Nocturia: Secondary | ICD-10-CM | POA: Diagnosis not present

## 2024-08-12 DIAGNOSIS — R350 Frequency of micturition: Secondary | ICD-10-CM | POA: Diagnosis not present

## 2024-08-12 DIAGNOSIS — N3281 Overactive bladder: Secondary | ICD-10-CM | POA: Diagnosis not present

## 2024-08-12 DIAGNOSIS — N401 Enlarged prostate with lower urinary tract symptoms: Secondary | ICD-10-CM | POA: Diagnosis not present

## 2024-08-31 ENCOUNTER — Encounter: Payer: Self-pay | Admitting: Cardiology

## 2024-08-31 ENCOUNTER — Ambulatory Visit: Attending: Cardiovascular Disease | Admitting: Cardiology

## 2024-08-31 VITALS — BP 106/54 | HR 58 | Ht 69.0 in | Wt 166.8 lb

## 2024-08-31 DIAGNOSIS — I10 Essential (primary) hypertension: Secondary | ICD-10-CM | POA: Diagnosis present

## 2024-08-31 DIAGNOSIS — G901 Familial dysautonomia [Riley-Day]: Secondary | ICD-10-CM | POA: Insufficient documentation

## 2024-08-31 DIAGNOSIS — R600 Localized edema: Secondary | ICD-10-CM | POA: Insufficient documentation

## 2024-08-31 DIAGNOSIS — I421 Obstructive hypertrophic cardiomyopathy: Secondary | ICD-10-CM | POA: Diagnosis present

## 2024-08-31 NOTE — Patient Instructions (Signed)
 Medication Instructions:  Your physician recommends that you continue on your current medications as directed. Please refer to the Current Medication list given to you today.  *If you need a refill on your cardiac medications before your next appointment, please call your pharmacy*  Lab Work: None.  If you have labs (blood work) drawn today and your tests are completely normal, you will receive your results only by: MyChart Message (if you have MyChart) OR A paper copy in the mail If you have any lab test that is abnormal or we need to change your treatment, we will call you to review the results.  Testing/Procedures: Your physician has requested that you have an echocardiogram. Echocardiography is a painless test that uses sound waves to create images of your heart. It provides your doctor with information about the size and shape of your heart and how well your heart's chambers and valves are working. This procedure takes approximately one hour. There are no restrictions for this procedure. Please do NOT wear cologne, perfume, aftershave, or lotions (deodorant is allowed). Please arrive 15 minutes prior to your appointment time.  Please note: We ask at that you not bring children with you during ultrasound (echo/ vascular) testing. Due to room size and safety concerns, children are not allowed in the ultrasound rooms during exams. Our front office staff cannot provide observation of children in our lobby area while testing is being conducted. An adult accompanying a patient to their appointment will only be allowed in the ultrasound room at the discretion of the ultrasound technician under special circumstances. We apologize for any inconvenience.   Follow-Up: At [facility], you and your health needs are our priority.  As part of our continuing mission to provide you with exceptional heart care, our providers are all part of one team.  This team includes your primary Cardiologist  (physician) and Advanced Practice Providers or APPs (Physician Assistants and Nurse Practitioners) who all work together to provide you with the care you need, when you need it.  Your next appointment:   1 year(s)  Provider:   Wilbert Bihari, MD

## 2024-08-31 NOTE — Addendum Note (Signed)
 Addended by: JANIT GENI CROME on: 08/31/2024 01:56 PM   Modules accepted: Orders

## 2024-08-31 NOTE — Progress Notes (Signed)
 [facility] Office Note:    Date:  08/31/2024   ID:  [name], DOB 1946-01-02, MRN [id number]  PCP:  Mason Lavern CROME, MD  Cardiologist:  Mason Bihari, MD    Referring MD: Mason Lavern CROME, MD   Chief Complaint  Patient presents with   Follow-up    HOCM, dysautonomia, hypertension    History of Present Illness:    [name] is a 78 y.o. male with a hx of HTN, OSA, Parkinson's, hyperlipidemia, pre- diabetes, asymmetric septal hypertrophy c/w HOCM with no outflow tact gradient and hypothyroidism. He was seen by Neuro at [facility], his Losartan  was stopped due to soft BPs.  Recent 2D echo showed moderately BSH at 15mm with no SAM and no LVOT obstruction and trivial MR.  He is here for follow-up today and is doing well. He denies any chest pain or pressure, SOB, DOE, PND, orthopnea, lower extremity edema, dizziness, palpitations or syncope.  Past Medical History:  Diagnosis Date   Allergic rhinitis    Allergy     Arthritis    At risk for falls    Cancer (HCC)    basal cell skin cancer   Cataract    Complication of anesthesia    Slow to awaken   Constipation    DDD (degenerative disc disease), cervical    Dysphagia    GERD (gastroesophageal reflux disease)    Decdreased, has decreased coffee intack.   Glaucoma    HTN (hypertension)    Hx of adenomatous colonic polyps 01/23/2016   Hyperlipidemia    Hypogonadism male    Hypothyroidism    OSA (obstructive sleep apnea)    Has had surgery x 2   Parkinsonism (HCC) 01/16/2013   no tremors, balance issues only , speech and swallowing difficulties    Sleep apnea    surgery x 2- no cpap   Staph infection 10-2014   Thyroid  disease    HYPOTHYROID    Past Surgical History:  Procedure Laterality Date   CATARACT EXTRACTION     COLONOSCOPY  04-27-2004   tics, hems    I & D EXTREMITY Right 10/18/2014   Procedure: IRRIGATION AND DEBRIDEMENT RIGHT KNEE PREPATELLA BURSA INFECTION;  Surgeon: Jerona Harden GAILS, MD;  Location: [facility] OR;   Service: Orthopedics;  Laterality: Right;   LUMBAR DISC SURGERY  04-2015   LUMBAR LAMINECTOMY WITH COFLEX 1 LEVEL N/A 06/06/2015   Procedure: Lumbar three-four Laminectomy/Foraminotomy with placement of Coflex;  Surgeon: Victory Gens, MD;  Location: [facility] NEURO ORS;  Service: Neurosurgery;  Laterality: N/A;   NASAL SEPTUM SURGERY     TONSILLECTOMY     and adenoids   UVULOPALATOPHARYNGOPLASTY      Current Medications: Current Meds  Medication Sig   aspirin  81 MG tablet Take 81 mg by mouth daily.   Azelastine  HCl 137 MCG/SPRAY SOLN PLACE 1 SPRAY INTO BOTH NOSTRILS 2 (TWO) TIMES DAILY   dorzolamide-timolol  (COSOPT) 2-0.5 % ophthalmic solution 1 drop 2 (two) times daily.   fexofenadine (ALLEGRA) 60 MG tablet Take 60 mg by mouth daily.    finasteride (PROSCAR) 5 MG tablet Take 5 mg by mouth daily.   fluticasone  (FLONASE ) 50 MCG/ACT nasal spray SPRAY 1 SPRAY INTO EACH NOSTRIL EVERY DAY   latanoprost (XALATAN) 0.005 % ophthalmic solution 1 drop at bedtime.   levothyroxine  (SYNTHROID ) 50 MCG tablet TAKE 1 TABLET BY MOUTH EVERY DAY   Multiple Vitamins-Minerals (MULTIVITAMIN WITH MINERALS) tablet Take 1 tablet by mouth daily.   polyethylene glycol (MIRALAX  / GLYCOLAX )  17 g packet Take 17 g by mouth daily.   rosuvastatin  (CRESTOR ) 5 MG tablet Take 1 tablet (5 mg total) by mouth daily.   [DISCONTINUED] Cholecalciferol (VITAMIN D ) 2000 units CAPS Take 1 capsule by mouth once a week.    [DISCONTINUED] Naproxen Sodium 220 MG CAPS Take by mouth. Once a month prn   [DISCONTINUED] nitrofurantoin , macrocrystal-monohydrate, (MACROBID ) 100 MG capsule Take 1 capsule (100 mg total) by mouth 2 (two) times daily.   [DISCONTINUED] timolol  (BETIMOL ) 0.5 % ophthalmic solution Place 1 drop into both eyes 2 (two) times daily.      Allergies:   Ambien [zolpidem tartrate], Atenolol, Lunesta [eszopiclone], Oxazepam, Requip [ropinirole hcl], and Ziac [bisoprolol-hydrochlorothiazide]   Social History   Socioeconomic  History   Marital status: Married    Spouse name: linda   Number of children: 2   Years of education: college   Highest education level: Not on file  Occupational History   Occupation: branch Psychologist, Occupational: RETIRED  Tobacco Use   Smoking status: Never   Smokeless tobacco: Never  Vaping Use   Vaping status: Never Used  Substance and Sexual Activity   Alcohol use: No    Alcohol/week: 0.0 standard drinks of alcohol   Drug use: No   Sexual activity: Not on file  Other Topics Concern   Not on file  Social History Narrative   3 Grandchildren    Social Drivers of Health   Financial Resource Strain: Low Risk  (03/17/2024)   Overall Financial Resource Strain (CARDIA)    Difficulty of Paying Living Expenses: Not hard at all  Food Insecurity: No Food Insecurity (03/17/2024)   Hunger Vital Sign    Worried About Running Out of Food in the Last Year: Never true    Ran Out of Food in the Last Year: Never true  Transportation Needs: No Transportation Needs (03/17/2024)   PRAPARE - Administrator, Civil Service (Medical): No    Lack of Transportation (Non-Medical): No  Physical Activity: Sufficiently Active (03/17/2024)   Exercise Vital Sign    Days of Exercise per Week: 4 days    Minutes of Exercise per Session: 60 min  Stress: No Stress Concern Present (03/17/2024)   [employer] of Occupational Health - Occupational Stress Questionnaire    Feeling of Stress : Not at all  Social Connections: Moderately Integrated (03/17/2024)   Social Connection and Isolation Panel    Frequency of Communication with Friends and Family: Once a week    Frequency of Social Gatherings with Friends and Family: More than three times a week    Attends Religious Services: 1 to 4 times per year    Active Member of [employer] or Organizations: No    Attends Engineer, Structural: Never    Marital Status: Married     Family History: The patient's family history includes COPD  in his mother and sister; Cancer in his father; Colon polyps in his brother and father; Diabetes in his father; Emphysema in his mother; Heart attack in his sister; Heart disease in his mother and sister; Hypertension in his father, mother, and sister; Liver cancer in his father. There is no history of Stroke, Colon cancer, or Parkinson's disease.  ROS:   Please see the history of present illness.    ROS  All other systems reviewed and negative.   EKGs/Labs/Other Studies Reviewed:    The following studies were reviewed today    Recent Labs: 04/08/2024: ALT  22; BUN 15; Creatinine, Ser 0.64; Hemoglobin 15.0; Platelets 253.0; Potassium 4.1; Sodium 139; TSH 2.64   Recent Lipid Panel    Component Value Date/Time   CHOL 111 04/08/2024 1419   TRIG 175.0 (H) 04/08/2024 1419   HDL 39.60 04/08/2024 1419   CHOLHDL 3 04/08/2024 1419   VLDL 35.0 04/08/2024 1419   LDLCALC 37 04/08/2024 1419   LDLCALC 57 09/23/2020 1356   LDLDIRECT 107.0 02/18/2018 1100   EKG Interpretation Date/Time:  Monday August 31 2024 13:43:30 EST Ventricular Rate:  58 PR Interval:  148 QRS Duration:  88 QT Interval:  402 QTC Calculation: 394 R Axis:   64  Text Interpretation: Sinus bradycardia When compared with ECG of 24-May-2023 15:20, No significant change was found Confirmed by Shlomo Corning ([id number]) on 08/31/2024 1:48:37 PM    Physical Exam:    VS:  BP (!) 106/54   Pulse (!) 58   Ht 5' 9 (1.753 m)   Wt 166 lb 12.8 oz (75.7 kg)   SpO2 99%   BMI 24.63 kg/m     Wt Readings from Last 3 Encounters:  08/31/24 166 lb 12.8 oz (75.7 kg)  04/08/24 177 lb 12.8 oz (80.6 kg)  03/17/24 174 lb (78.9 kg)    No data found.  GEN: Well nourished, well developed in no acute distress HEENT: Normal NECK: No JVD; No carotid bruits LYMPHATICS: No lymphadenopathy CARDIAC:RRR, no murmurs, rubs, gallops RESPIRATORY:  scattered rhonchi ABDOMEN: Soft, non-tender, non-distended MUSCULOSKELETAL:  No edema; No deformity   SKIN: Warm and dry NEUROLOGIC:  Alert and oriented x 3 PSYCHIATRIC:  Normal affect  ASSESSMENT:    1. HOCM (hypertrophic obstructive cardiomyopathy) (HCC)   2. Dysautonomia (HCC)   3. Lower extremity edema   4. Benign essential HTN    PLAN:    In order of problems listed above:  1.  HOCM  - ? Dx >> echo 2018 showed moderate BSH with normal LVF and no LVOT obstruction or SAM.   -He has not had a cardiac MRI but given advanced Parkinson's would not pursue Cardiac MRI at this time.  -repeat 2D echo 9/21 showed moderate BSH with normal LVF and no LVOT obstruction or SAM -He has not had any chest pain, shortness of breath, dizziness or syncope.  Denies any palpitations -repeat 2D echo since it has been 4 years  2.  Autonomic dysregulation -BPs have been soft in the past related to autonomic dysregulation from his Parkinsons -continue his compression hose  -he has not had any problems with dizziness or syncope recently  3.  Chronic LE edema  - Denies lower extremity edema - this is well controlled with compression hose.    4.  HTN - BP controlled on exam today -diet controlled  Followup with me in 1 year   Medication Adjustments/Labs and Tests Ordered: Current medicines are reviewed at length with the patient today.  Concerns regarding medicines are outlined above.  Orders Placed This Encounter  Procedures   EKG 12-Lead   No orders of the defined types were placed in this encounter.   Signed, Corning Shlomo, MD  08/31/2024 1:53 PM    [facility]

## 2024-09-01 DIAGNOSIS — H04123 Dry eye syndrome of bilateral lacrimal glands: Secondary | ICD-10-CM | POA: Diagnosis not present

## 2024-09-01 DIAGNOSIS — H401131 Primary open-angle glaucoma, bilateral, mild stage: Secondary | ICD-10-CM | POA: Diagnosis not present

## 2024-10-13 ENCOUNTER — Ambulatory Visit (HOSPITAL_COMMUNITY)

## 2024-11-13 ENCOUNTER — Ambulatory Visit: Payer: Self-pay

## 2024-11-13 NOTE — Telephone Encounter (Signed)
 FYI Only or Action Required?: FYI only for provider: appointment scheduled on 2/10 and wife requesting Prednisone  prescription if able;  unable to be seen Monday due to another medical appointment; please advise if you feel patient needs seen sooner and notify patient.  Patient was last seen in primary care on 04/08/2024 by Jodie Lavern CROME, MD.  Called Nurse Triage reporting Back Pain.  Symptoms began several days ago.  Interventions attempted: OTC medications: Tylenol , Aleve, Prescription medications: medication to prevent UTI, Rest, hydration, or home remedies, and Ice/heat application.  Symptoms are: stable.  Triage Disposition: See PCP When Office is Open (Within 3 Days) - Tuesday  Patient/caregiver understands and will follow disposition?: Yes        Message from Re[facility] ORN sent at 11/13/2024  1:12 PM EST  Reason for Triage: lower back pain, hurts when standing.   Reason for Disposition  [1] MODERATE back pain (e.g., interferes with normal activities) AND [2] present > 3 days    Does not interfere with exercises nor general activities, only during certain position changes.  Answer Assessment - Initial Assessment Questions Wife calls in to report symptoms and schedule appointment.  Patient is present but has difficulty communicating due to baseline status.   Wife therefore provides information.    1. ONSET: When did the pain begin? (e.g., minutes, hours, days)     4 days ago; has not had this exact pain in the past but does have history of back surgery related to weakness in legs and constant pain.  This pain is only positional.   2. LOCATION: Where does it hurt? (upper, mid or lower back)     Slighltly off to the side by about half an inch, otherwise mid lower back  3. SEVERITY: How bad is the pain?  (e.g., Scale 1-10; mild, moderate, or severe)     When walking- moderate.  No pain at rest.   4. PATTERN: Is the pain constant? (e.g., yes, no; constant, intermittent)       With activity  5. RADIATION: Does the pain shoot into your legs or somewhere else?     Denies radiation to legs or elsewhere, not new trouble with bowel or bladder control; is chronically incontinent  6. CAUSE:  What do you think is causing the back pain?      Arthritis per wife  7. BACK OVERUSE:  Any recent lifting of heavy objects, strenuous work or exercise?     No recent falls or overuse;  baseline walker use- leans forward for comfort sometimes  8. MEDICINES: What have you taken so far for the pain? (e.g., nothing, acetaminophen , NSAIDS)     Taking Aleve and Tylenol  twice per day; they help;  not having much pain except with movement from standing to sitting and sitting to laying; also taking prescribed medication to prevent UTI, using Voltaren cream and Biofreeze which both are not really helpful;  is able to do normal stretches and exercises without any pain (brings knees to chest and side to side).   9. NEUROLOGIC SYMPTOMS: Do you have any weakness, numbness, or problems with bowel/bladder control?     Uses walker for comfort; no new weakness, numbness, tingling or other concerns.  No signs of urinary infection per wife.   10. OTHER SYMPTOMS: Do you have any other symptoms? (e.g., fever, abdomen pain, burning with urination, blood in urine)       No fever, no pain until standing up in lower back; great urine output; pain  increases when stretching out.  Denies any urinary symptoms.  Due to other medical appointments and transportation needs, requesting Tuesday instead of Monday.   Also inquiring about prednisone  prescription.  Protocols used: Back Pain-A-AH

## 2024-11-16 ENCOUNTER — Ambulatory Visit (HOSPITAL_COMMUNITY)

## 2024-11-17 ENCOUNTER — Ambulatory Visit: Admitting: Physician Assistant

## 2024-12-07 ENCOUNTER — Ambulatory Visit: Admitting: Adult Health

## 2025-03-22 ENCOUNTER — Ambulatory Visit

## 2025-04-12 ENCOUNTER — Ambulatory Visit: Admitting: Family Medicine

## 2025-04-14 ENCOUNTER — Ambulatory Visit: Admitting: Family Medicine
